# Patient Record
Sex: Male | Born: 1955 | Race: Black or African American | Hispanic: No | Smoking: Current some day smoker
Health system: Southern US, Community
[De-identification: ages and names within clinical notes are randomized; demographics above are authoritative.]

## PROBLEM LIST (undated history)

## (undated) DIAGNOSIS — E119 Type 2 diabetes mellitus without complications: Secondary | ICD-10-CM

## (undated) DIAGNOSIS — I161 Hypertensive emergency: Secondary | ICD-10-CM

## (undated) DIAGNOSIS — A419 Sepsis, unspecified organism: Secondary | ICD-10-CM

## (undated) DIAGNOSIS — N179 Acute kidney failure, unspecified: Secondary | ICD-10-CM

## (undated) DIAGNOSIS — K529 Noninfective gastroenteritis and colitis, unspecified: Secondary | ICD-10-CM

## (undated) DIAGNOSIS — I10 Essential (primary) hypertension: Secondary | ICD-10-CM

## (undated) HISTORY — DX: Sepsis, unspecified organism: A41.9

## (undated) HISTORY — DX: Hypertensive emergency: I16.1

## (undated) HISTORY — DX: Acute kidney failure, unspecified: N17.9

## (undated) HISTORY — PX: OTHER SURGICAL HISTORY: SHX169

---

## 1999-06-30 ENCOUNTER — Emergency Department (HOSPITAL_COMMUNITY): Admission: EM | Admit: 1999-06-30 | Discharge: 1999-06-30 | Payer: Self-pay | Admitting: Emergency Medicine

## 2000-10-19 ENCOUNTER — Emergency Department (HOSPITAL_COMMUNITY): Admission: EM | Admit: 2000-10-19 | Discharge: 2000-10-19 | Payer: Self-pay | Admitting: Emergency Medicine

## 2001-04-11 ENCOUNTER — Emergency Department (HOSPITAL_COMMUNITY): Admission: EM | Admit: 2001-04-11 | Discharge: 2001-04-11 | Payer: Self-pay

## 2007-08-15 ENCOUNTER — Inpatient Hospital Stay (HOSPITAL_COMMUNITY): Admission: EM | Admit: 2007-08-15 | Discharge: 2007-08-19 | Payer: Self-pay | Admitting: Internal Medicine

## 2007-08-15 IMAGING — CR DG CHEST 1V PORT
1 series · 1 of 1 positions shown · non-contrast
Comparison: none

CLINICAL DATA: Hypertension

Portable chest at [ZI]:
No previous available for comparison. Mild cardiomegaly. Tortuous thoracic
aorta. Lungs are clear. No effusion.

[view not recorded]
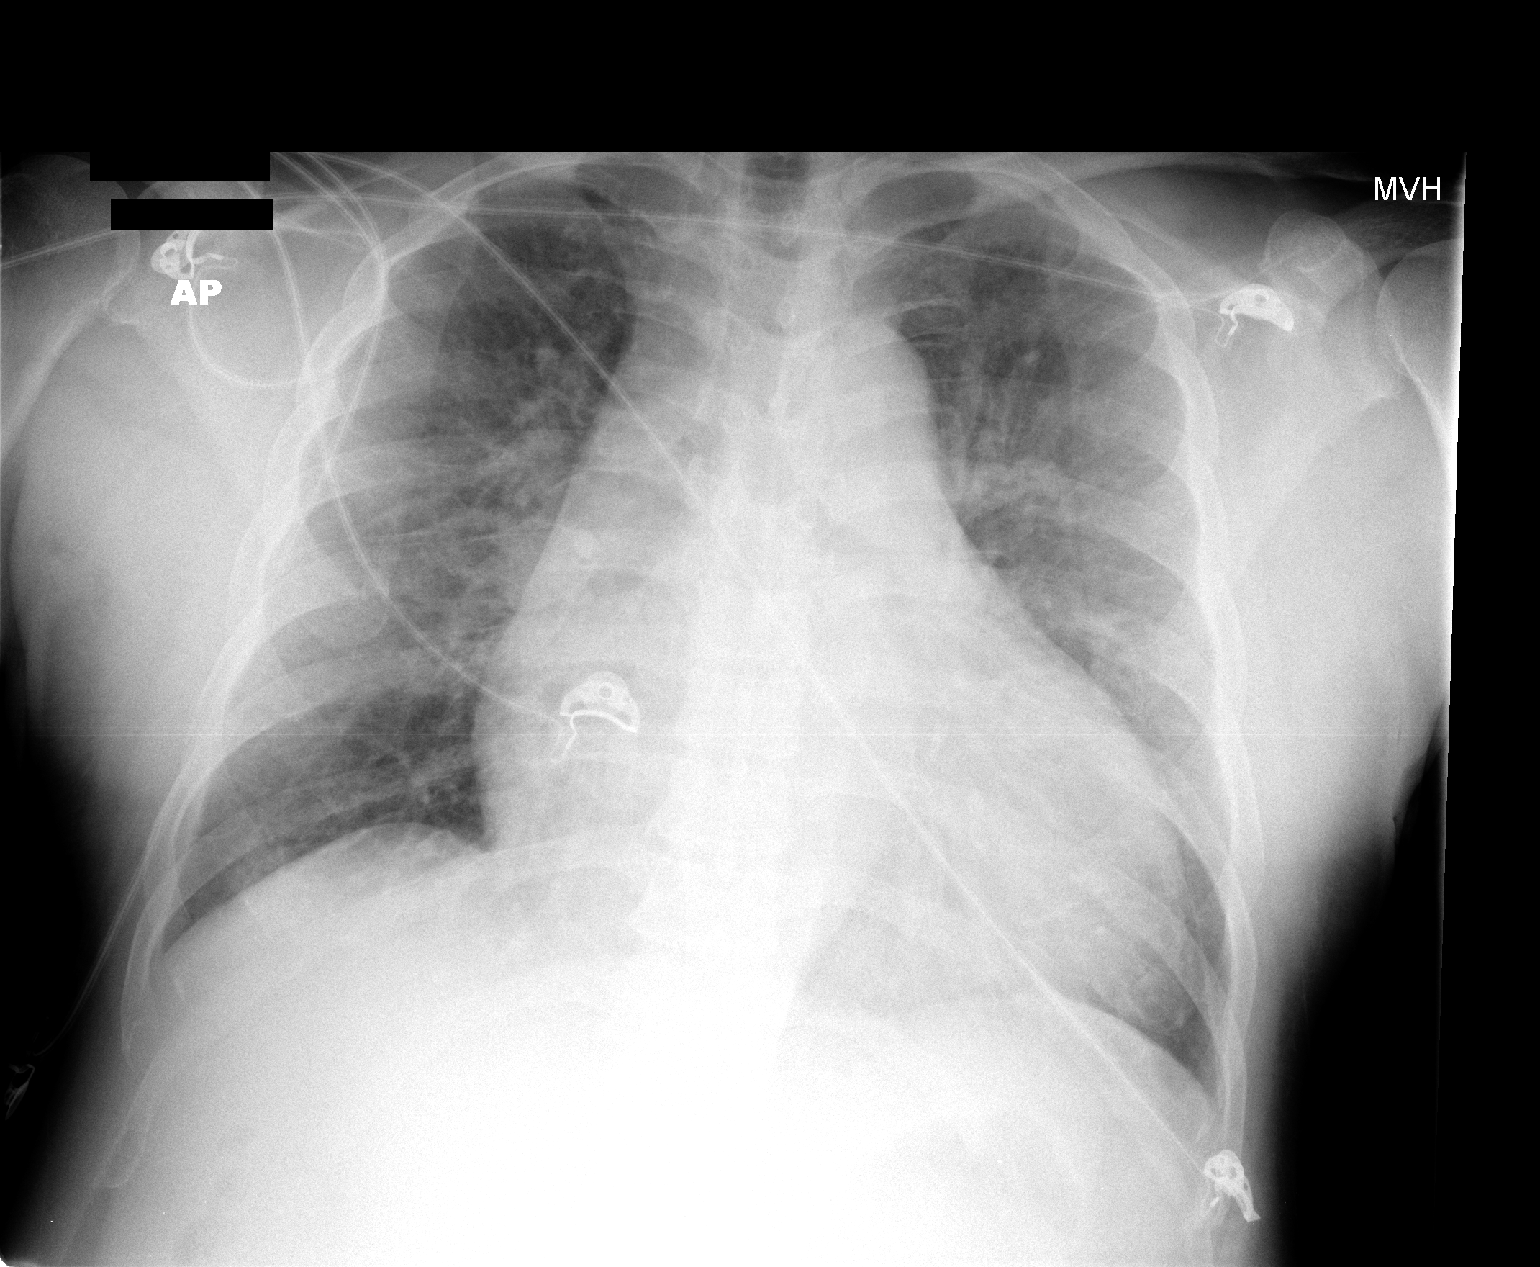

[1 of 1 positions shown; findings below may reference images not displayed]

IMPRESSION: 1. Cardiomegaly

## 2007-10-24 ENCOUNTER — Inpatient Hospital Stay (HOSPITAL_COMMUNITY): Admission: EM | Admit: 2007-10-24 | Discharge: 2007-10-26 | Payer: Self-pay | Admitting: Emergency Medicine

## 2007-10-24 IMAGING — CT CT ABDOMEN W/ CM
2 of 5 series · 17 of 46 positions shown, 19 images · IV contrast (APPLIED)
Comparison: none

CLINICAL DATA: Mid abdominal pain with cramping. 
 ABDOMEN CT WITH CONTRAST:
TECHNIQUE: Multidetector CT imaging of the abdomen was performed following the standard protocol during bolus administration of intravenous contrast.  Helical imaging of the abdomen and supplemental sagittal reformatted images were made.
 Contrast:  100 cc Omnipaque 300 IV and dilute oral contrast.
TECHNIQUE: Multidetector CT imaging of the pelvis was performed following the standard protocol during bolus administration of intravenous contrast.  Helical imaging was performed.

[Series 2: abd/pelv with 5.0 b31f st · axial · 0.68mm/px · z∈[-524,-114]mm · 14 of 94 slices shown, 16 images]
[im 6/94  soft-tissue]
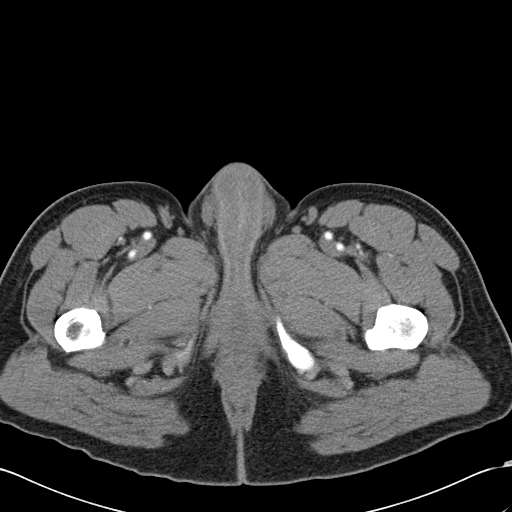
[im 6/94  bone]
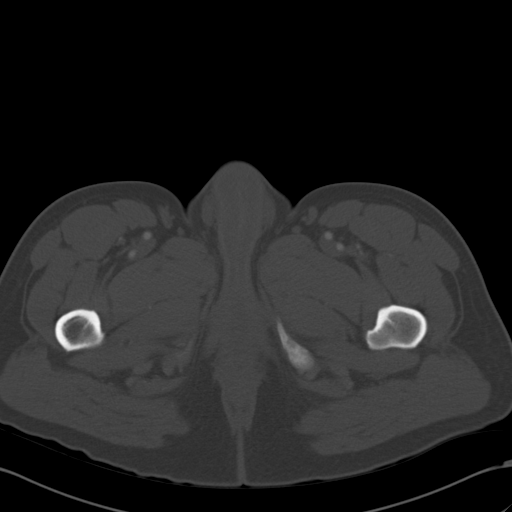
[im 11/94  soft-tissue]
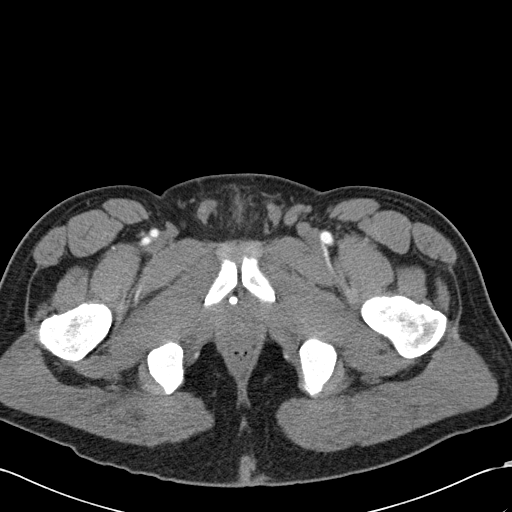
[im 21/94  soft-tissue]
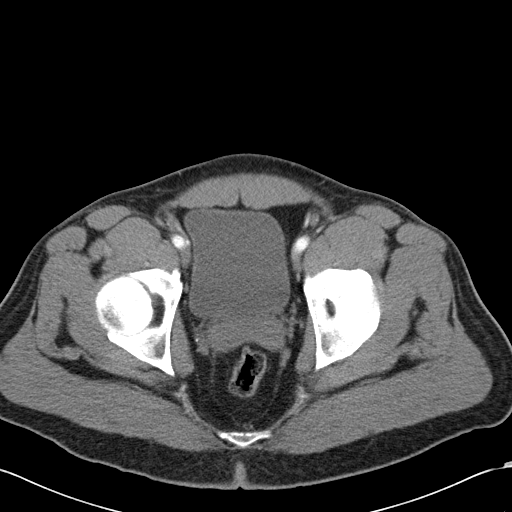
[im 26/94  soft-tissue]
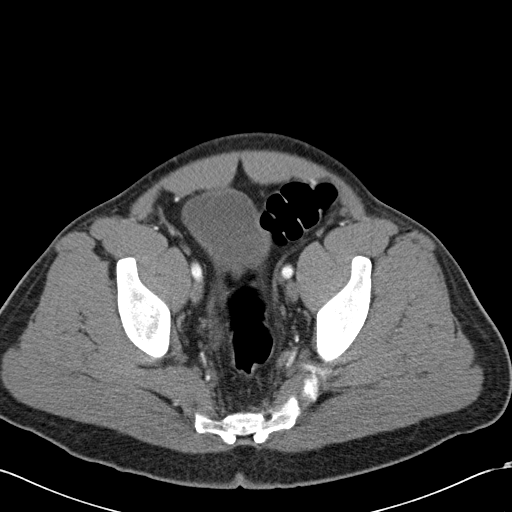
[im 32/94  soft-tissue]
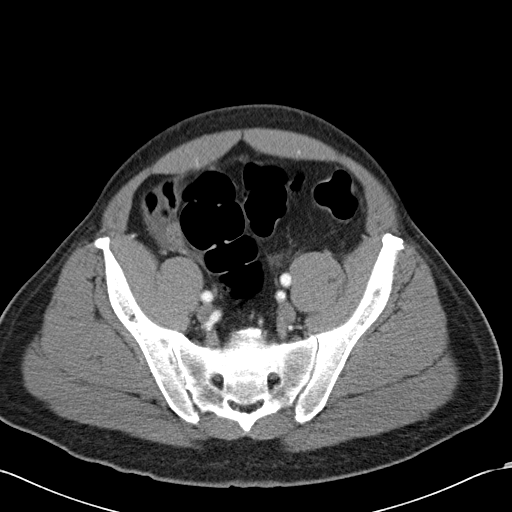
[im 37/94  soft-tissue]
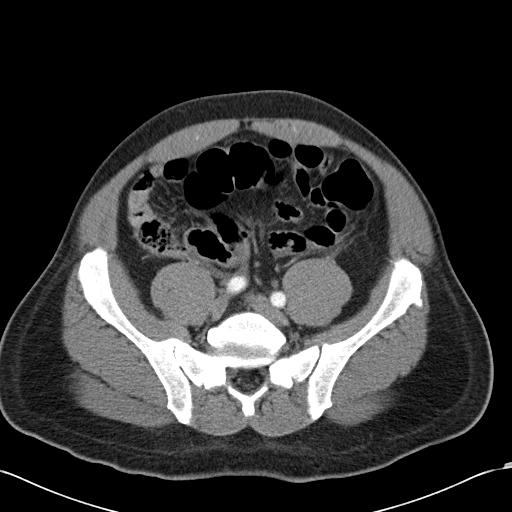
[im 42/94  soft-tissue]
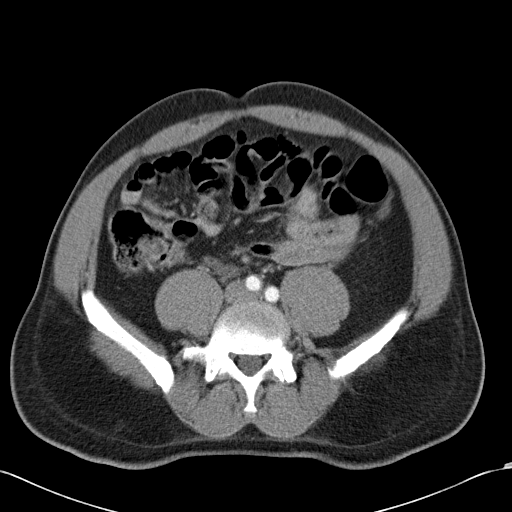
[im 52/94  soft-tissue]
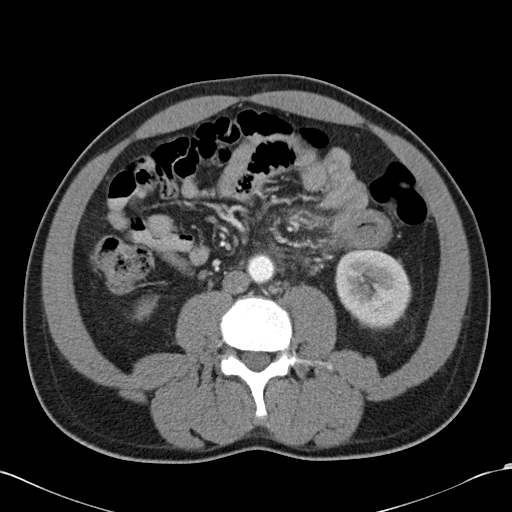
[im 57/94  soft-tissue]
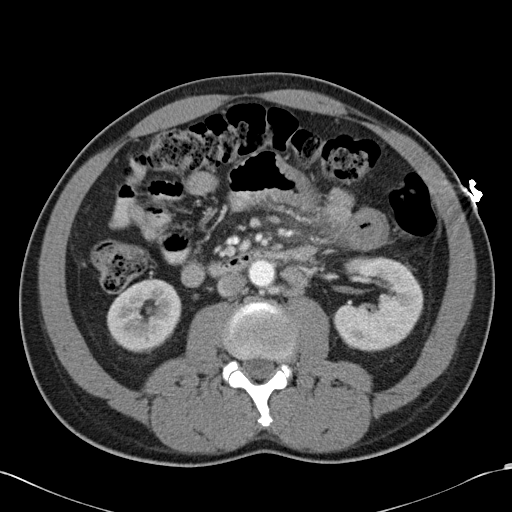
[im 57/94  bone]
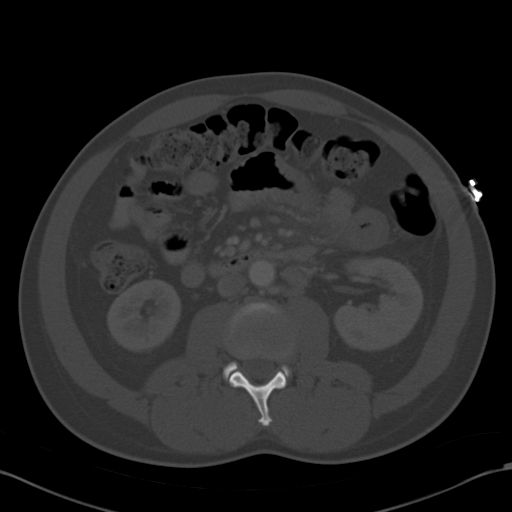
[im 63/94  soft-tissue]
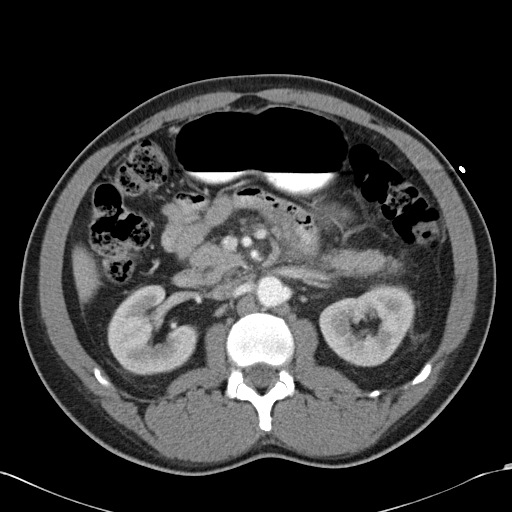
[im 68/94  soft-tissue]
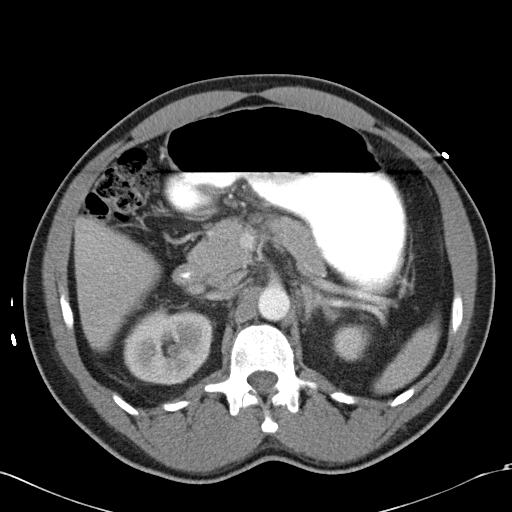
[im 73/94  soft-tissue]
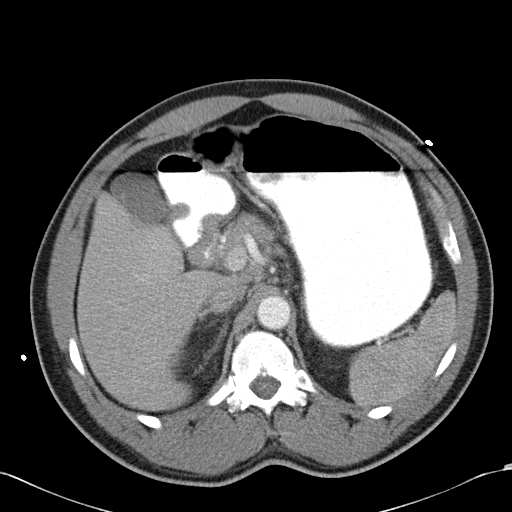
[im 83/94  soft-tissue]
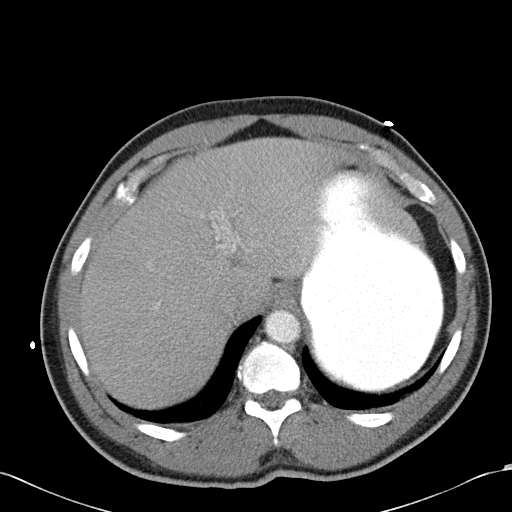
[im 88/94  soft-tissue]
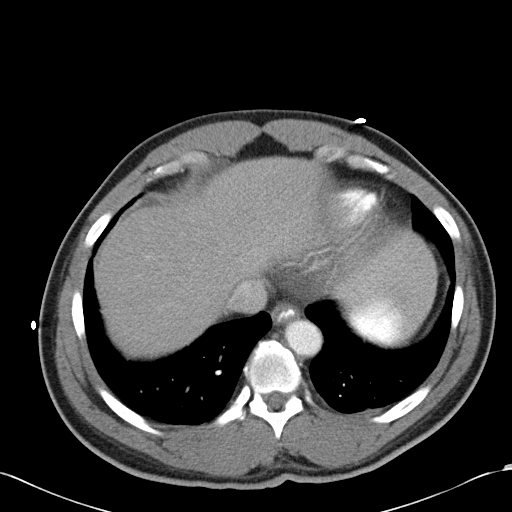

[Series 4: abd/pelv with 2.0 spo cor st · coronal · 0.91mm/px · 3 of 122 slices shown]
[im 41/122  soft-tissue]
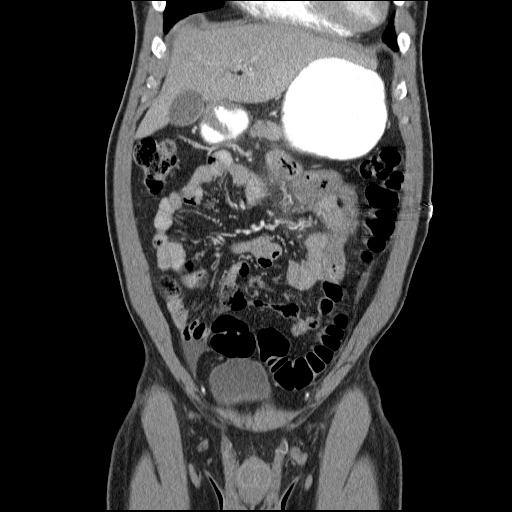
[im 54/122  soft-tissue]
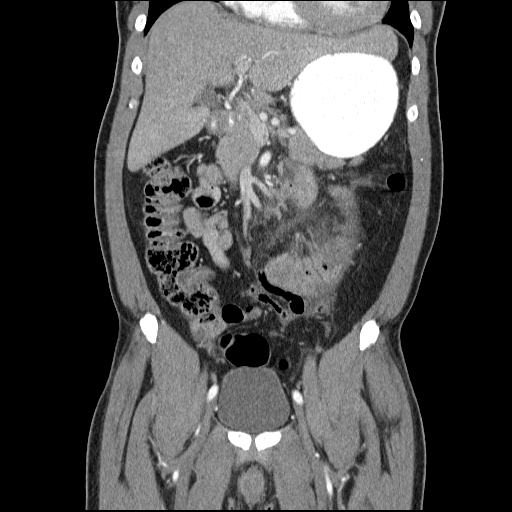
[im 68/122  soft-tissue]
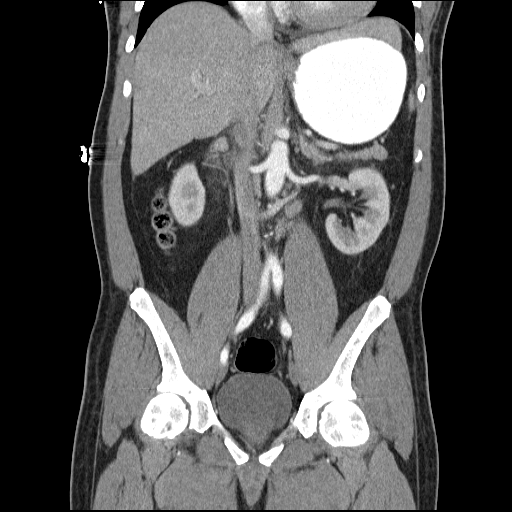

[17 of 46 positions shown; findings below may reference images not displayed]

FINDINGS: Thick-walled loops of small bowel in the region of the jejunum.  These are nonspecific.  The adjacent mesentery is normal.  There is no evidence for obstruction.  The colon is unremarkable.  The appendix is identified without inflammatory change.  Lung bases are clear.  Liver, spleen, pancreas, adrenal glands, and kidneys are normal.  Gallbladder is unremarkable.  Small amount of free fluid within the right lower quadrant.
IMPRESSION: Abnormal thick-walled loops of small bowel, nonspecific.  Question enteritis.  The appendix is normal.  Small amount of free fluid within the right lower quadrant.
 PELVIS CT WITH CONTRAST:
FINDINGS: The bladder is normal.  The ureter is decompressed.  Calcification is attributed to phleboliths.  Sigmoid colon and rectum are unremarkable.
IMPRESSION: CT pelvis negative.

## 2007-10-24 IMAGING — CR DG ABDOMEN ACUTE W/ 1V CHEST
3 series · 3 of 3 positions shown · non-contrast
Comparison: [DATE]

CLINICAL DATA: 51-year-old male with abdominal pain and constipation. History of coronary artery disease and  CHF.
ACUTE ABDOMINAL SERIES - 3 VIEW:

[w chest pa]
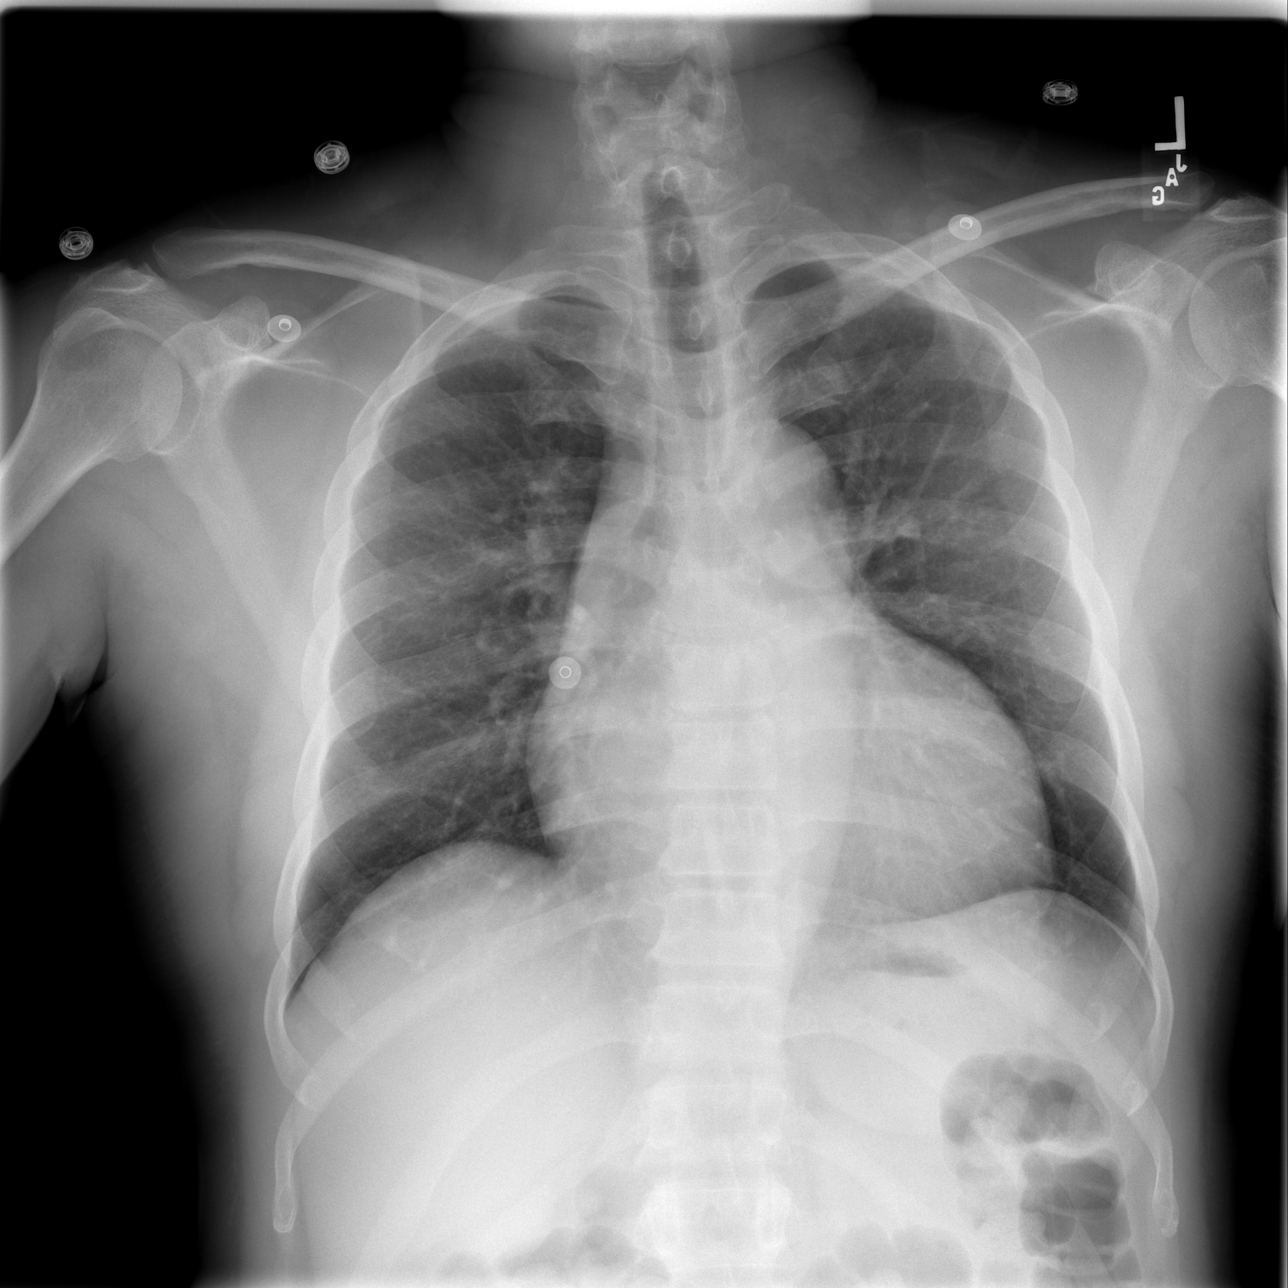

[w abdomen upright]
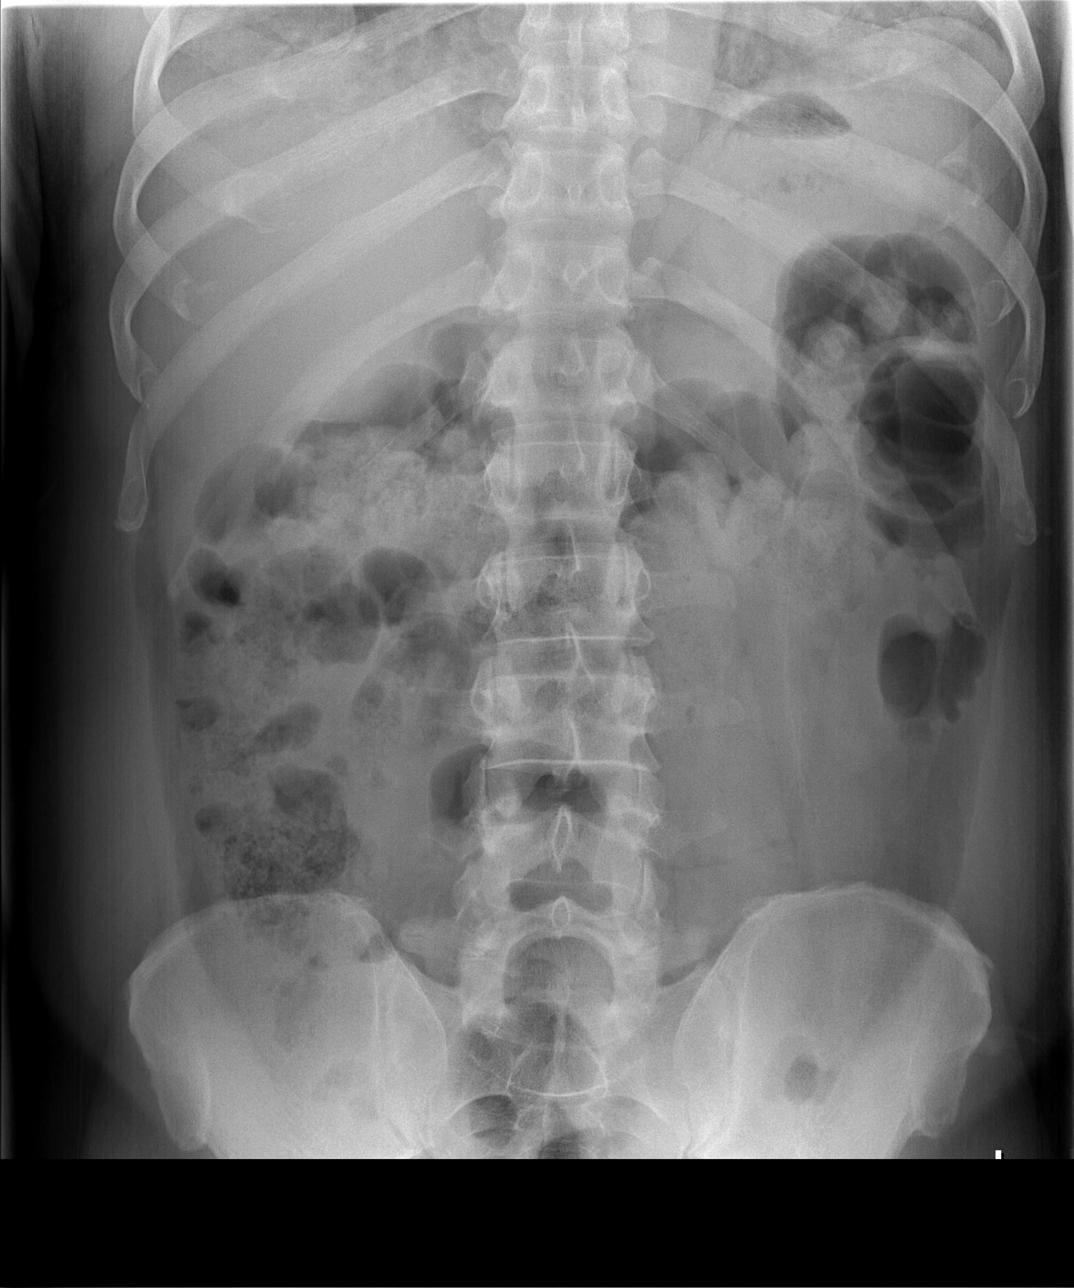

[t abdomen supine]
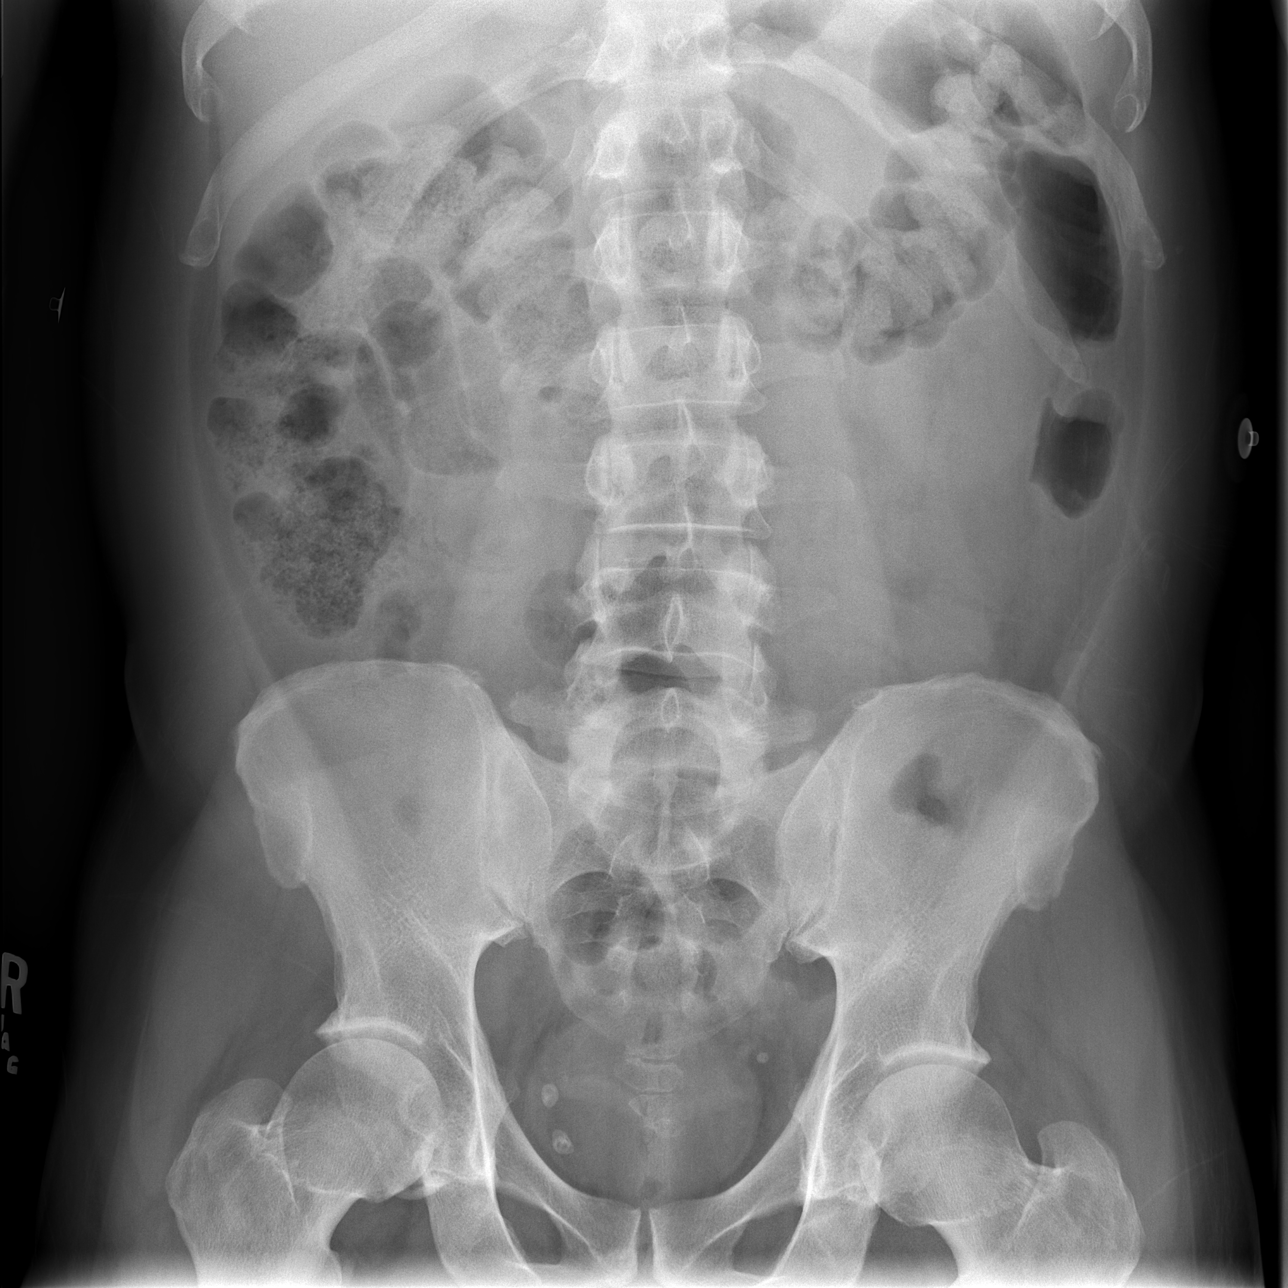

[3 of 3 positions shown; findings below may reference images not displayed]

FINDINGS: The heart is enlarged. No current pneumonia, CHF, or pneumothorax. Moderate retained stool throughout the colon.  No definite obstruction or free air. No abnormal calcification is identified. Pelvic calcifications are likely vascular.
IMPRESSION: 1.  Cardiomegaly without CHF.
2.  Nonobstructive bowel gas pattern.  No definite free air.

## 2011-03-14 NOTE — Discharge Summary (Signed)
Manuel Kramer, Manuel Kramer              ACCOUNT NO.:  [id number]   MEDICAL RECORD NO.:  [id number]          PATIENT TYPE:  INP   LOCATION:  3738                         FACILITY:  [facility]   PHYSICIAN:  Fleet Contras, M.D.    DATE OF BIRTH:  [birth year]   DATE OF ADMISSION:  10/24/2007  DATE OF DISCHARGE:  10/26/2007                               DISCHARGE SUMMARY   DISCHARGE DIAGNOSES:  1. Abdominal pain due to enteritis.  2. Accelerated hypertension felt to be due to noncompliance to      medication, resolved.  3. Congestive heart failure with ejection fraction of 40 percent.  4. Type 2 diabetes, diet-controlled.  5. Leukocytosis on outpatient followup, felt to be secondary to his      enteritis.   DISCHARGE MEDICATIONS:  Patient is going home to continue his  preadmission medications of:  1. BiDil one tab t.i.d.  2. Carvedilol 40 mg daily.  3. Lasix 40 mg daily.  4. K-Dur 20 mEq daily.  5. Norvasc 10 mg daily.  6. Januvia 50 mg daily.   New medicines are:  1. Cipro 500 b.i.d.  2. Flagyl 500 t.i.d.  3. Clonidine patch has been discontinued.  He is to stay on clonidine      0.1 mg p.o. t.i.d.   CONDITION ON DISCHARGE:  Improved, satisfactory.  Patient discharged in  stable satisfactory condition.   He should follow up with his PCP, Dr. Concepcion Elk, in 1-2 weeks, he is  supposed to call for appointment.   REASON FOR ADMISSION:  Accelerated hypertension with enteritis.  Patient  presented to the ED with abdominal pain lower abdominal area with upper  GI symptoms.  He had missed his medications including clonidine patch.  His systolic blood pressure was more than 200 range.  He required  several doses of labetalol.  CT scan showed small bowel wall thickening  consistent with enteritis.  He was admitted to the hospital for IV  antibiotics and followup of his leukocytosis of more than 20,000.  He  has received pain support.  Aggressive antibiotic management as well as  antiplatelet  management was instituted.  Gradually, the patient's  overall status improved including resolution of his upper GI symptoms  and abdominal pain.  He did not have any fever.  We repeated his  leukocyte count the day of admission and was still elevated at more than  20,000.  However, we could not get it repeated today.  The patient is  afebrile.  He denies any chills.  His abdominal pain is resolved nearly  and he does not have any nausea.   VITAL SIGNS THIS MORNING:  A BP of 143/84, pulse 84, temp 98.5 degrees  Fahrenheit, respirations 20.  LUNGS:  Clear to auscultation.  CARDIAC:  No gallop.  ABDOMEN:  Soft, bowel sounds present.  Mild lower abdominal tenderness  without any rebound tenderness.  EXTREMITIES:  No cyanosis, no edema.   He is discharged in stable satisfactory condition.  He has been given a  prescription for antibiotics and clonidine as mentioned above.      Jackie Plum, M.D.  Electronically Signed     ______________________________  Fleet Contras, M.D.    GO/MEDQ  D:  10/26/2007  T:  10/27/2007  Job:  [id number]   cc:   Fleet Contras, M.D.

## 2011-03-14 NOTE — H&P (Signed)
Manuel Kramer, SEELMAN              ACCOUNT NO.:  [id number]   MEDICAL RECORD NO.:  [id number]          PATIENT TYPE:  INP   LOCATION:  3738                         FACILITY:  [facility]   PHYSICIAN:  Jackie Plum, M.D.DATE OF BIRTH:  Aug 22, 1956   DATE OF ADMISSION:  10/24/2007  DATE OF DISCHARGE:                              HISTORY & PHYSICAL   CHIEF COMPLAINT:  Abdominal pain.   HISTORY OF PRESENT ILLNESS:  A 55 year old gentleman who presented with  lower abdominal pain described as constant, dull and cramping and mild  to moderate in intensity without any radiation, of 3 days' duration.  He  had missed his medication for 3 days.  He has had some nausea without  vomiting.  Denied any fever or chills.  In the emergency room the  patient had a CT scan of the abdomen and pelvis after x-ray of the chest  had cardiomegaly without CHF and nonobstructive bowel gas pattern.  The  CT report indicated abnormal, thick-walled loops of small bowel,  nonspecific.  The appendix was normal with a small amount of free fluid  within the right lower quadrant.  The patient blood pressure was very  high with systolic more than 220.  Therefore, he was admitted for  further evaluation and management of abdominal pain, enteritis and  accelerated hypertension.   PAST MEDICAL HISTORY:  Positive history of type 2 diabetes and  hypertension.   MEDICATION HISTORY:  As noted and signed by me on the medication  reconciliation form, which is a part of the patient's permanent medical  record.   PHYSICAL EXAMINATION:  VITAL SIGNS:  BP was 280/51.  Respirations 20.  The patient was afebrile.  GENERAL:  The patient was moderately in pain.  No acute distress.  LUNGS:  Fields are clear, __________ no crackles.  CARDIAC:  No gallop.  ABDOMEN:  Full, soft, with lower abdominal tenderness noted without any  rebound tenderness.  The patient was alert and oriented x3.   Labs reviewed with leukocytosis of 21,000 on  CBC with a thrombocytopenia  of 117.  UA negative for UTI.  Chemistries included a sodium of 134 with  normal BUN and creatinine.  The rest of the complete metabolic panel was  noted.   IMPRESSION:  1. Accelerated hypertension due to medication noncompliance,      especially patient on clonidine.  2. Abdominal pain secondary to enteritis.  3. Medication noncompliance.  4. Leukocytosis.   The patient is admitted for pain control, antibiotics, supportive care.      Jackie Plum, M.D.  Electronically Signed     GO/MEDQ  D:  10/25/2007  T:  10/25/2007  Job:  [id number]   cc:   Fleet Contras, M.D.

## 2011-03-14 NOTE — H&P (Signed)
Kramer, Manuel              ACCOUNT NO.:  [id number]   MEDICAL RECORD NO.:  [id number]          PATIENT TYPE:  INP   LOCATION:  1225                         FACILITY:  [facility]   PHYSICIAN:  Fleet Contras, M.D.    DATE OF BIRTH:  17-Nov-1955   DATE OF ADMISSION:  08/15/2007  DATE OF DISCHARGE:                              HISTORY & PHYSICAL   PRESENTING COMPLAINT:  Leg swelling, shortness of breath.   HISTORY OF PRESENT ILLNESS:  Manuel Kramer is a 55 year old, African-  American gentleman with past medical history significant for type 2  diabetes mellitus and hypertension, who was last seen by me in January  2007.  He walked into the office today with a 2-week history of  progressively worsening symptoms of shortness of breath, dyspnea on  exertion, orthopnea, PND and leg swelling.  He denied having any chest  pain or palpitations.  He stated that he can only walk a few blocks  before he gets out of breath.  He can hardly climb steps, and his legs  have progressively become more swollen.  He has gained a lot of weight  recently, most likely due to the fluids he is retaining.  He denies any  nausea, diaphoresis or vomiting.  He has some headaches and dizziness  but no syncope or seizures.  He denies any abdominal pain, diarrhea or  constipation.  He has no dysuria, frequency or hematuria.  He has no  joint pains or swelling.  At home, he has had to sleep on a chair  because he is unable to lie flat, and he sleeps with several pillows if  he lies on the bed.  His wife says he has not been sleeping for several  days due to his shortness of breath, and he tends to jump out of bed in  the middle of the night and seems to gasp for air.  In the office today,  he is not in any acute respiratory or painful distress at rest, but he  seems to get out of breath with speaking.  His initial vital signs show  a blood pressure of 180/100.  He weighed in at 185 pounds at a height of  4 feet 5 inches.   His heart rate was 90, regular, respiratory rate was  18, temperature was 98.6.  He has some rales in his lungs.  Extremities  show 3+ pitting edema of the feet, ankles, legs up to his thighs.  It  was thought because of his new onset symptoms he was best to be admitted  to the hospital for further evaluation and appropriate management.   PAST MEDICAL HISTORY:  1. Type 2 diabetes mellitus.  2. Systemic hypertension.   MEDICATION HISTORY:  He had been on NovoLog insulin and Amaryl and Actos  in the past, but he has not been back to the office for refills or  adjustment in his medications in almost 2 years.   ALLERGIES:  He has no known drug allergies.   PAST SURGICAL HISTORY:  Noncontributory.   FAMILY AND SOCIAL HISTORY:  His father died of prostate  cancer.  His  mother died of complications of heart disease and diabetes.  He had a  sister who was healthy.  He has two children, one son and one daughter,  who are healthy.  He is married and lives with his wife.  He smokes  about four cigarettes per day and has a 30-pack-year history.  He does  not have a regular exercise routine.  Denies any use of alcohol, tobacco  or illicit drugs.   REVIEW OF SYSTEMS:  Essentially as above.   PHYSICAL EXAMINATION:  He is a middle-aged, African-American gentleman  sitting on the examination bed not in acute respiratory or painful  distress.  He is not pale.  He is not icteric.  He is not cyanosed.  His pupils are  equal and reactive to light and accommodation.  His neck is supple with no elevated JVD.  No cervical lymphadenopathy.  No carotid bruit.  CHEST:  Shows good air entry bilaterally with bibasilar rales, fine  rales.  Heart sounds 1 and 2 are heard.  No murmurs, no S3 gallops.  ABDOMEN:  Is soft, nontender, no masses.  Bowel sounds are present.  EXTREMITIES:  Shows 3+ pitting edema of the feet, ankle, legs, knees and  thighs.  Peripheral pulses are present bilaterally.  CENTRAL  NERVOUS SYSTEM:  Is alert and oriented x3 with no focal  neurological deficits.   Primary lab performed in the office:  CBG was 97.   ASSESSMENT:  Manuel Kramer is a 55 year old, African-American gentleman with  multiple medical problems including type 2 diabetes mellitus,  hypertension and noncompliance with prescribed therapy.  He presents  today with signs and symptoms of new onset congestive heart failure.  He  recalls that he had been admitted in the past with congestive heart  failure and was told he had an enlarged.  He is being admitted today to  the hospital for further evaluation and appropriate management.   ADMISSION DIAGNOSES:  1. Congestive heart failure, likely with systolic dysfunction.  2. Hypertension with urgency.  He will be placed in a step-down bed.      He will be on 2-gram sodium, carbohydrate-modified, medium diet.      Activity will be at bedrest with bathroom privileges.  Vital signs      will be checked every 4 hours.  CBG is to be checked a.c. and h.s.      Strict I&Os and daily weights.  Further respiratory data would      include a CBC, CMET, BNP, urinalysis, EKG, CKs and troponin q.8h.      x3.  Chest x-ray and 2-D echocardiogram will be requested.   MEDICATION:  He will be started on:  1. IV Lasix 40 mg q.8h.  2. IV nitroglycerin at 5 mcg and titrate to keep systolic blood      pressure greater than 110.  3. P.o. Lisinopril 10 mg daily.  4. Protonix 40 mg daily.  5. Aspirin 325 mg daily.  6. Lovenox 40 mg subcutaneous daily for DVT prophylaxis.  7. He can have Tylenol 650 q.6h. p.r.n. for headaches.   This plan of care has been discussed with him and his wife, and their  questions have been answered.      Fleet Contras, M.D.  Electronically Signed     EA/MEDQ  D:  08/15/2007  T:  08/17/2007  Job:  [id number]

## 2011-03-17 NOTE — Discharge Summary (Signed)
Manuel Kramer, Manuel Kramer              ACCOUNT NO.:  [id number]   MEDICAL RECORD NO.:  [id number]          PATIENT TYPE:  INP   LOCATION:  1425                         FACILITY:  [facility]   PHYSICIAN:  Fleet Contras, M.D.    DATE OF BIRTH:  [birth year]   DATE OF ADMISSION:  08/15/2007  DATE OF DISCHARGE:  08/19/2007                               DISCHARGE SUMMARY   HISTORY OF PRESENT ILLNESS:  Mr. Norberto is a 55 year old African American  gentleman with past medical history for systemic hypertension, diabetes  mellitus.  She was admitted directly from the office where she presented  with a 2-week history of progressively worsening shortness of breath,  dyspnea on exertion, orthopnea, PND and leg swelling.  He gained quite a  bit weight due to fluid retention.  Denied any nausea or vomiting or  diaphoresis and chest pain, palpitations.  He did have some headaches  and dizziness, but no syncope or seizures.  In the office his blood  pressure was 180/100.  Heart rate was 90, respiratory rate 18,  temperature 98.6.  He had rales in the lungs, 3+pitting edema in the  feet and ankles and legs up to the knees.  Was thought to have onset of  congestive heart failure and was admitted to the hospital.   HOSPITAL COURSE:  On admission was started on intravenous Lasix q.8 h  intravenous, nitroglycerin 5 mcg titrated to systolic blood pressure  less than 150, Norvasc p.o. 10 mg daily, Lovenox 40 mg subcu for DVT  prophylaxis, Protonix 40 mg daily for GI prophylaxis, aspirin 325 mg  daily.  Further laboratory data included CBC, C-met, BNP, urinalysis,  EKG, serial CKs and troponin, chest x-ray and 2-D echocardiogram were  requested.  He was admitted to the step-down unit.  His initial  laboratory data showed a sodium 140, potassium 3.5, chloride 104,  bicarbonate of 28, BUN 19, creatinine 1.74, glucose of 178, white count  was 8.7, hemoglobin 11.8, hematocrit 35.5 and platelets 237, CPK and  troponin x3  were negative.  BNP was 828.  Chest x-ray showed  cardiomegaly without edema.  EKG showed normal sinus rhythm with LVH,  biatrial enlargement.  Coreg, amlodipine, BiDil were added to heart  failure regimen and intravenous labetalol was tapered and discontinued.  He was on basal insulin bolus as well as sliding scale NovoLog insulin.  CBGs were checked a.c. and h.s.  A 2-D echocardiogram revealed ejection  fraction of 40%.  The ACE inhibitors and angiotensin II blockers were  put on hold due to mild renal insufficiency.  Potassium was repleted,  both orally as well as intravenously.  His condition improved  significantly during hospitalization.  Was able to sleep peacefully at  lying flat without any shortness of breath.  On August 19, 2007, his  blood pressure was 139/92, heart rate of 80, respirations 18,  temperature 98.1 and O2 sats on room air was 98-95%.  Chest was clear to  auscultation.  Extremities showed no edema.  His laboratory data showed  sodium of 138, potassium 3.0, chloride 102, bicarbonate of 28, BUN 22,  creatinine 1.6 and glucose of 83.  BNP was down to 260 was therefore  considered stable for discharge home.   DISCHARGE DIAGNOSIS:  1. Decompensated congestive heart failure with systolic dysfunction      and ejection fraction of 40%.  2. Hypertensive urgency with improved control.  3. Type 2 diabetes mellitus, well-controlled.  4. Renal insufficiency, mild.  5. Hypokalemia, repleted.   DISCHARGE MEDICATIONS:  1. BiDil one p.o. t.i.d.  2. Coreg CR 40 mg daily.  3. Lasix 40 mg daily.  4. Potassium 20 mEq daily.  5. Norvasc 10 mg daily.  6. Catapres patch 0.1 mg TTS one q. Weekly.  7. Januvia 100 mg half p.o. daily.   FOLLOWUP:  To follow-up with me in the office in one week and to  maintain a low-sodium, heart healthy diet.   CONDITION ON DISCHARGE:  Stable.   DISPOSITION:  Home.      Fleet Contras, M.D.  Electronically Signed     EA/MEDQ  D:   09/11/2007  T:  09/12/2007  Job:  [id number]

## 2011-08-04 LAB — COMPREHENSIVE METABOLIC PANEL
ALT: 48
Alkaline Phosphatase: 60
BUN: 19
CO2: 30
Calcium: 8.5
GFR calc non Af Amer: 59 — ABNORMAL LOW
Glucose, Bld: 144 — ABNORMAL HIGH
Total Protein: 5.6 — ABNORMAL LOW

## 2011-08-04 LAB — CBC
HCT: 33.8 — ABNORMAL LOW
HCT: 44.5
Hemoglobin: 11.3 — ABNORMAL LOW
Hemoglobin: 15
MCHC: 33.7
MCV: 93.4
Platelets: 101 — ABNORMAL LOW
RBC: 3.62 — ABNORMAL LOW
RBC: 4.78
RDW: 14.2
WBC: 21.6 — ABNORMAL HIGH

## 2011-08-04 LAB — URINALYSIS, ROUTINE W REFLEX MICROSCOPIC
Leukocytes, UA: NEGATIVE
Nitrite: NEGATIVE
Protein, ur: 100 — AB
Specific Gravity, Urine: 1.023
Urobilinogen, UA: 1

## 2011-08-04 LAB — BASIC METABOLIC PANEL
BUN: 20
Chloride: 99
GFR calc Af Amer: 58 — ABNORMAL LOW
GFR calc non Af Amer: 48 — ABNORMAL LOW
Potassium: 3.7
Sodium: 135

## 2011-08-04 LAB — DIFFERENTIAL
Basophils Relative: 0
Eosinophils Absolute: 0
Lymphs Abs: 1.4
Monocytes Relative: 9
Neutro Abs: 17.7 — ABNORMAL HIGH
Neutrophils Relative %: 84 — ABNORMAL HIGH

## 2011-08-04 LAB — URINE MICROSCOPIC-ADD ON

## 2011-08-04 LAB — CARDIAC PANEL(CRET KIN+CKTOT+MB+TROPI)
CK, MB: 4
Total CK: 21

## 2011-08-09 LAB — CARDIAC PANEL(CRET KIN+CKTOT+MB+TROPI)
CK, MB: 2.7
CK, MB: 3.5
CK, MB: 4
Relative Index: INVALID
Total CK: 31
Total CK: 48

## 2011-08-09 LAB — BASIC METABOLIC PANEL
Calcium: 8.5
Calcium: 8.6
Calcium: 8.8
Chloride: 102
Chloride: 104
Chloride: 105
Creatinine, Ser: 1.6 — ABNORMAL HIGH
Creatinine, Ser: 1.73 — ABNORMAL HIGH
GFR calc Af Amer: 47 — ABNORMAL LOW
GFR calc Af Amer: 51 — ABNORMAL LOW
GFR calc Af Amer: 56 — ABNORMAL LOW
GFR calc non Af Amer: 39 — ABNORMAL LOW
GFR calc non Af Amer: 42 — ABNORMAL LOW
GFR calc non Af Amer: 46 — ABNORMAL LOW
Glucose, Bld: 122 — ABNORMAL HIGH
Potassium: 3.5
Sodium: 140
Sodium: 141

## 2011-08-09 LAB — COMPREHENSIVE METABOLIC PANEL
ALT: 55 — ABNORMAL HIGH
Albumin: 3.7
Alkaline Phosphatase: 74
Chloride: 104
Glucose, Bld: 168 — ABNORMAL HIGH
Potassium: 3.3 — ABNORMAL LOW
Sodium: 139
Total Bilirubin: 0.9
Total Protein: 6.7

## 2011-08-09 LAB — URINALYSIS, ROUTINE W REFLEX MICROSCOPIC
Bilirubin Urine: NEGATIVE
Glucose, UA: NEGATIVE
Hgb urine dipstick: NEGATIVE
Nitrite: NEGATIVE
Specific Gravity, Urine: 1.012
pH: 6

## 2011-08-09 LAB — LIPID PANEL
Triglycerides: 87
VLDL: 17

## 2011-08-09 LAB — CBC
HCT: 35.5 — ABNORMAL LOW
HCT: 37.9 — ABNORMAL LOW
Hemoglobin: 11.8 — ABNORMAL LOW
Hemoglobin: 12.8 — ABNORMAL LOW
RDW: 14.8 — ABNORMAL HIGH
WBC: 8.7
WBC: 9.4

## 2011-08-09 LAB — TSH: TSH: 1.995

## 2011-08-09 LAB — B-NATRIURETIC PEPTIDE (CONVERTED LAB)
Pro B Natriuretic peptide (BNP): 307 — ABNORMAL HIGH
Pro B Natriuretic peptide (BNP): 728 — ABNORMAL HIGH
Pro B Natriuretic peptide (BNP): 877 — ABNORMAL HIGH

## 2014-10-02 ENCOUNTER — Encounter: Payer: Self-pay | Admitting: Internal Medicine

## 2014-10-02 ENCOUNTER — Ambulatory Visit: Payer: Medicaid Other | Attending: Internal Medicine | Admitting: Internal Medicine

## 2014-10-02 VITALS — BP 134/94 | HR 72 | Temp 98.0°F | Resp 16 | Wt 159.4 lb

## 2014-10-02 DIAGNOSIS — F17210 Nicotine dependence, cigarettes, uncomplicated: Secondary | ICD-10-CM | POA: Diagnosis not present

## 2014-10-02 DIAGNOSIS — Z8042 Family history of malignant neoplasm of prostate: Secondary | ICD-10-CM | POA: Insufficient documentation

## 2014-10-02 DIAGNOSIS — G4700 Insomnia, unspecified: Secondary | ICD-10-CM

## 2014-10-02 DIAGNOSIS — I10 Essential (primary) hypertension: Secondary | ICD-10-CM | POA: Insufficient documentation

## 2014-10-02 DIAGNOSIS — N529 Male erectile dysfunction, unspecified: Secondary | ICD-10-CM | POA: Diagnosis not present

## 2014-10-02 DIAGNOSIS — I161 Hypertensive emergency: Secondary | ICD-10-CM

## 2014-10-02 DIAGNOSIS — N289 Disorder of kidney and ureter, unspecified: Secondary | ICD-10-CM

## 2014-10-02 DIAGNOSIS — F17200 Nicotine dependence, unspecified, uncomplicated: Secondary | ICD-10-CM | POA: Insufficient documentation

## 2014-10-02 DIAGNOSIS — Z720 Tobacco use: Secondary | ICD-10-CM

## 2014-10-02 DIAGNOSIS — E139 Other specified diabetes mellitus without complications: Secondary | ICD-10-CM | POA: Insufficient documentation

## 2014-10-02 DIAGNOSIS — Z794 Long term (current) use of insulin: Secondary | ICD-10-CM | POA: Diagnosis not present

## 2014-10-02 DIAGNOSIS — E1165 Type 2 diabetes mellitus with hyperglycemia: Secondary | ICD-10-CM | POA: Diagnosis present

## 2014-10-02 DIAGNOSIS — Z7982 Long term (current) use of aspirin: Secondary | ICD-10-CM | POA: Diagnosis not present

## 2014-10-02 HISTORY — DX: Hypertensive emergency: I16.1

## 2014-10-02 HISTORY — DX: Disorder of kidney and ureter, unspecified: N28.9

## 2014-10-02 LAB — GLUCOSE, POCT (MANUAL RESULT ENTRY): POC GLUCOSE: 345 mg/dL — AB (ref 70–99)

## 2014-10-02 LAB — POCT GLYCOSYLATED HEMOGLOBIN (HGB A1C): HEMOGLOBIN A1C: 11.5

## 2014-10-02 MED ORDER — AMLODIPINE BESYLATE 10 MG PO TABS: 10.0000 mg | ORAL_TABLET | Freq: Every day | ORAL | Status: DC

## 2014-10-02 MED ORDER — LISINOPRIL 40 MG PO TABS: 40.0000 mg | ORAL_TABLET | Freq: Every day | ORAL | Status: DC

## 2014-10-02 MED ORDER — INSULIN NPH ISOPHANE & REGULAR (70-30) 100 UNIT/ML ~~LOC~~ SUSP: 40.0000 [IU] | Freq: Two times a day (BID) | SUBCUTANEOUS | Status: DC

## 2014-10-02 MED ORDER — INSULIN GLARGINE 100 UNIT/ML ~~LOC~~ SOLN: 45.0000 [IU] | Freq: Every day | SUBCUTANEOUS | Status: DC

## 2014-10-02 MED ORDER — ATENOLOL-CHLORTHALIDONE 100-25 MG PO TABS: 1.0000 | ORAL_TABLET | Freq: Every day | ORAL | Status: DC

## 2014-10-02 NOTE — Patient Instructions (Addendum)
Diabetes Mellitus and Food It is important for you to manage your blood sugar (glucose) level. Your blood glucose level can be greatly affected by what you eat. Eating healthier foods in the appropriate amounts throughout the day at about the same time each day will help you control your blood glucose level. It can also help slow or prevent worsening of your diabetes mellitus. Healthy eating may even help you improve the level of your blood pressure and reach or maintain a healthy weight.  HOW CAN FOOD AFFECT ME? Carbohydrates Carbohydrates affect your blood glucose level more than any other type of food. Your dietitian will help you determine how many carbohydrates to eat at each meal and teach you how to count carbohydrates. Counting carbohydrates is important to keep your blood glucose at a healthy level, especially if you are using insulin or taking certain medicines for diabetes mellitus. Alcohol Alcohol can cause sudden decreases in blood glucose (hypoglycemia), especially if you use insulin or take certain medicines for diabetes mellitus. Hypoglycemia can be a life-threatening condition. Symptoms of hypoglycemia (sleepiness, dizziness, and disorientation) are similar to symptoms of having too much alcohol.  If your health care provider has given you approval to drink alcohol, do so in moderation and use the following guidelines:  Women should not have more than one drink per day, and men should not have more than two drinks per day. One drink is equal to:  12 oz of beer.  5 oz of wine.  1 oz of hard liquor.  Do not drink on an empty stomach.  Keep yourself hydrated. Have water, diet soda, or unsweetened iced tea.  Regular soda, juice, and other mixers might contain a lot of carbohydrates and should be counted. WHAT FOODS ARE NOT RECOMMENDED? As you make food choices, it is important to remember that all foods are not the same. Some foods have fewer nutrients per serving than other  foods, even though they might have the same number of calories or carbohydrates. It is difficult to get your body what it needs when you eat foods with fewer nutrients. Examples of foods that you should avoid that are high in calories and carbohydrates but low in nutrients include:  Trans fats (most processed foods list trans fats on the Nutrition Facts label).  Regular soda.  Juice.  Candy.  Sweets, such as cake, pie, doughnuts, and cookies.  Fried foods. WHAT FOODS CAN I EAT? Have nutrient-rich foods, which will nourish your body and keep you healthy. The food you should eat also will depend on several factors, including:  The calories you need.  The medicines you take.  Your weight.  Your blood glucose level.  Your blood pressure level.  Your cholesterol level. You also should eat a variety of foods, including:  Protein, such as meat, poultry, fish, tofu, nuts, and seeds (lean animal proteins are best).  Fruits.  Vegetables.  Dairy products, such as milk, cheese, and yogurt (low fat is best).  Breads, grains, pasta, cereal, rice, and beans.  Fats such as olive oil, trans fat-free margarine, canola oil, avocado, and olives. DOES EVERYONE WITH DIABETES MELLITUS HAVE THE SAME MEAL PLAN? Because every person with diabetes mellitus is different, there is not one meal plan that works for everyone. It is very important that you meet with a dietitian who will help you create a meal plan that is just right for you. Document Released: 07/13/2005 Document Revised: 10/21/2013 Document Reviewed: 09/12/2013 ExitCare Patient Information 2015 [employer]. This   information is not intended to replace advice given to you by your health care provider. Make sure you discuss any questions you have with your health care provider. DASH Eating Plan DASH stands for "Dietary Approaches to Stop Hypertension." The DASH eating plan is a healthy eating plan that has been shown to reduce high  blood pressure (hypertension). Additional health benefits may include reducing the risk of type 2 diabetes mellitus, heart disease, and stroke. The DASH eating plan may also help with weight loss. WHAT DO I NEED TO KNOW ABOUT THE DASH EATING PLAN? For the DASH eating plan, you will follow these general guidelines:  Choose foods with a percent daily value for sodium of less than 5% (as listed on the food label).  Use salt-free seasonings or herbs instead of table salt or sea salt.  Check with your health care provider or pharmacist before using salt substitutes.  Eat lower-sodium products, often labeled as "lower sodium" or "no salt added."  Eat fresh foods.  Eat more vegetables, fruits, and low-fat dairy products.  Choose whole grains. Look for the word "whole" as the first word in the ingredient list.  Choose fish and skinless chicken or turkey more often than red meat. Limit fish, poultry, and meat to 6 oz (170 g) each day.  Limit sweets, desserts, sugars, and sugary drinks.  Choose heart-healthy fats.  Limit cheese to 1 oz (28 g) per day.  Eat more home-cooked food and less restaurant, buffet, and fast food.  Limit fried foods.  Cook foods using methods other than frying.  Limit canned vegetables. If you do use them, rinse them well to decrease the sodium.  When eating at a restaurant, ask that your food be prepared with less salt, or no salt if possible. WHAT FOODS CAN I EAT? Seek help from a dietitian for individual calorie needs. Grains Whole grain or whole wheat bread. [place]atton rice. Whole grain or whole wheat pasta. Quinoa, bulgur, and whole grain cereals. Low-sodium cereals. Corn or whole wheat flour tortillas. Whole grain cornbread. Whole grain crackers. Low-sodium crackers. Vegetables Fresh or frozen vegetables (raw, steamed, roasted, or grilled). Low-sodium or reduced-sodium tomato and vegetable juices. Low-sodium or reduced-sodium tomato sauce and paste. Low-sodium  or reduced-sodium canned vegetables.  Fruits All fresh, canned (in natural juice), or frozen fruits. Meat and Other Protein Products Ground beef (85% or leaner), grass-fed beef, or beef trimmed of fat. Skinless chicken or turkey. Ground chicken or turkey. Pork trimmed of fat. All fish and seafood. Eggs. Dried beans, peas, or lentils. Unsalted nuts and seeds. Unsalted canned beans. Dairy Low-fat dairy products, such as skim or 1% milk, 2% or reduced-fat cheeses, low-fat ricotta or cottage cheese, or plain low-fat yogurt. Low-sodium or reduced-sodium cheeses. Fats and Oils Tub margarines without trans fats. Light or reduced-fat mayonnaise and salad dressings (reduced sodium). Avocado. Safflower, olive, or canola oils. Natural peanut or almond butter. Other Unsalted popcorn and pretzels. The items listed above may not be a complete list of recommended foods or beverages. Contact your dietitian for more options. WHAT FOODS ARE NOT RECOMMENDED? Grains White bread. White pasta. White rice. Refined cornbread. Bagels and croissants. Crackers that contain trans fat. Vegetables Creamed or fried vegetables. Vegetables in a cheese sauce. Regular canned vegetables. Regular canned tomato sauce and paste. Regular tomato and vegetable juices. Fruits Dried fruits. Canned fruit in light or heavy syrup. Fruit juice. Meat and Other Protein Products Fatty cuts of meat. Ribs, chicken wings, bacon, sausage, bologna, salami, chitterlings, fatback, hot   dogs, bratwurst, and packaged luncheon meats. Salted nuts and seeds. Canned beans with salt. Dairy Whole or 2% milk, cream, half-and-half, and cream cheese. Whole-fat or sweetened yogurt. Full-fat cheeses or blue cheese. Nondairy creamers and whipped toppings. Processed cheese, cheese spreads, or cheese curds. Condiments Onion and garlic salt, seasoned salt, table salt, and sea salt. Canned and packaged gravies. Worcestershire sauce. Tartar sauce. Barbecue sauce.  Teriyaki sauce. Soy sauce, including reduced sodium. Steak sauce. Fish sauce. Oyster sauce. Cocktail sauce. Horseradish. Ketchup and mustard. Meat flavorings and tenderizers. Bouillon cubes. Hot sauce. Tabasco sauce. Marinades. Taco seasonings. Relishes. Fats and Oils Butter, stick margarine, lard, shortening, ghee, and bacon fat. Coconut, palm kernel, or palm oils. Regular salad dressings. Other Pickles and olives. Salted popcorn and pretzels. The items listed above may not be a complete list of foods and beverages to avoid. Contact your dietitian for more information. WHERE CAN I FIND MORE INFORMATION? National Heart, Lung, and [employer] Institute: www.nhlbi.nih.gov/health/health-topics/topics/dash/ Document Released: 10/05/2011 Document Revised: 03/02/2014 Document Reviewed: 08/20/2013 ExitCare Patient Information 2015 [employer]. This information is not intended to replace advice given to you by your health care provider. Make sure you discuss any questions you have with your health care provider. Smoking Cessation Quitting smoking is important to your health and has many advantages. However, it is not always easy to quit since nicotine is a very addictive drug. Oftentimes, people try 3 times or more before being able to quit. This document explains the best ways for you to prepare to quit smoking. Quitting takes hard work and a lot of effort, but you can do it. ADVANTAGES OF QUITTING SMOKING  You will live longer, feel better, and live better.  Your body will feel the impact of quitting smoking almost immediately.  Within 20 minutes, blood pressure decreases. Your pulse returns to its normal level.  After 8 hours, carbon monoxide levels in the blood return to normal. Your oxygen level increases.  After 24 hours, the chance of having a heart attack starts to decrease. Your breath, hair, and body stop smelling like smoke.  After 48 hours, damaged nerve endings begin to recover. Your sense  of taste and smell improve.  After 72 hours, the body is virtually free of nicotine. Your bronchial tubes relax and breathing becomes easier.  After 2 to 12 weeks, lungs can hold more air. Exercise becomes easier and circulation improves.  The risk of having a heart attack, stroke, cancer, or lung disease is greatly reduced.  After 1 year, the risk of coronary heart disease is cut in half.  After 5 years, the risk of stroke falls to the same as a nonsmoker.  After 10 years, the risk of lung cancer is cut in half and the risk of other cancers decreases significantly.  After 15 years, the risk of coronary heart disease drops, usually to the level of a nonsmoker.  If you are pregnant, quitting smoking will improve your chances of having a healthy baby.  The people you live with, especially any children, will be healthier.  You will have extra money to spend on things other than cigarettes. QUESTIONS TO THINK ABOUT BEFORE ATTEMPTING TO QUIT You may want to talk about your answers with your health care provider.  Why do you want to quit?  If you tried to quit in the past, what helped and what did not?  What will be the most difficult situations for you after you quit? How will you plan to handle them?  Who   can help you through the tough times? Your family? Friends? A health care provider?  What pleasures do you get from smoking? What ways can you still get pleasure if you quit? Here are some questions to ask your health care provider:  How can you help me to be successful at quitting?  What medicine do you think would be best for me and how should I take it?  What should I do if I need more help?  What is smoking withdrawal like? How can I get information on withdrawal? GET READY  Set a quit date.  Change your environment by getting rid of all cigarettes, ashtrays, matches, and lighters in your home, car, or work. Do not let people smoke in your home.  Review your past  attempts to quit. Think about what worked and what did not. GET SUPPORT AND ENCOURAGEMENT You have a better chance of being successful if you have help. You can get support in many ways.  Tell your family, friends, and coworkers that you are going to quit and need their support. Ask them not to smoke around you.  Get individual, group, or telephone counseling and support. Programs are available at local hospitals and health centers. Call your local health department for information about programs in your area.  Spiritual beliefs and practices may help some smokers quit.  Download a "quit meter" on your computer to keep track of quit statistics, such as how long you have gone without smoking, cigarettes not smoked, and money saved.  Get a self-help book about quitting smoking and staying off tobacco. LEARN NEW SKILLS AND BEHAVIORS  Distract yourself from urges to smoke. Talk to someone, go for a walk, or occupy your time with a task.  Change your normal routine. Take a different route to work. Drink tea instead of coffee. Eat breakfast in a different place.  Reduce your stress. Take a hot bath, exercise, or read a book.  Plan something enjoyable to do every day. Reward yourself for not smoking.  Explore interactive web-based programs that specialize in helping you quit. GET MEDICINE AND USE IT CORRECTLY Medicines can help you stop smoking and decrease the urge to smoke. Combining medicine with the above behavioral methods and support can greatly increase your chances of successfully quitting smoking.  Nicotine replacement therapy helps deliver nicotine to your body without the negative effects and risks of smoking. Nicotine replacement therapy includes nicotine gum, lozenges, inhalers, nasal sprays, and skin patches. Some may be available over-the-counter and others require a prescription.  Antidepressant medicine helps people abstain from smoking, but how this works is unknown. This  medicine is available by prescription.  Nicotinic receptor partial agonist medicine simulates the effect of nicotine in your brain. This medicine is available by prescription. Ask your health care provider for advice about which medicines to use and how to use them based on your health history. Your health care provider will tell you what side effects to look out for if you choose to be on a medicine or therapy. Carefully read the information on the package. Do not use any other product containing nicotine while using a nicotine replacement product.  RELAPSE OR DIFFICULT SITUATIONS Most relapses occur within the first 3 months after quitting. Do not be discouraged if you start smoking again. Remember, most people try several times before finally quitting. You may have symptoms of withdrawal because your body is used to nicotine. You may crave cigarettes, be irritable, feel very hungry, cough often, get   headaches, or have difficulty concentrating. The withdrawal symptoms are only temporary. They are strongest when you first quit, but they will go away within 10-14 days. To reduce the chances of relapse, try to:  Avoid drinking alcohol. Drinking lowers your chances of successfully quitting.  Reduce the amount of caffeine you consume. Once you quit smoking, the amount of caffeine in your body increases and can give you symptoms, such as a rapid heartbeat, sweating, and anxiety.  Avoid smokers because they can make you want to smoke.  Do not let weight gain distract you. Many smokers will gain weight when they quit, usually less than 10 pounds. Eat a healthy diet and stay active. You can always lose the weight gained after you quit.  Find ways to improve your mood other than smoking. FOR MORE INFORMATION  www.smokefree.gov  Document Released: 10/10/2001 Document Revised: 03/02/2014 Document Reviewed: 01/25/2012 ExitCare Patient Information 2015 [employer]. This information is not intended to  replace advice given to you by your health care provider. Make sure you discuss any questions you have with your health care provider.  

## 2014-10-02 NOTE — Progress Notes (Signed)
Patient Demographics  Manuel Kramer, is a 58 y.o. male  VQQ:Manuel [id number]:[id number]  EPP:[id number]  DOB - 02-25-1956  CC:  Chief Complaint  Patient presents with  . Establish Care       HPI: Manuel PaceBertrand Kramer is a 58 y.o. male here today to establish medical care.he has history of diabetes, hypertension,erectile dysfunction, insomnia, renal insufficiency, patient denies any headache dizziness chest and shortness of breath, for diabetes patient has been taking Lantus40 units as well as Novolin 40 units twice a day with meals, patient does not check his blood sugar regularly, his hemoglobin A1c today is 11.5%, patient has already eaten today and his sugar level was elevated.patient also history of insomnia and was using Ambien in the past, have advised patient to try over-the-counter melatonin. Patient has No headache, No chest pain, No abdominal pain - No Nausea, No new weakness tingling or numbness, No Cough - SOB.  Not on File History reviewed. No pertinent past medical history. No current outpatient prescriptions on file prior to visit.   No current facility-administered medications on file prior to visit.   Family History  Problem Relation Age of Onset  . Cancer Mother   . Diabetes Mother   . Hypertension Mother   . Cancer Father    History   Social History  . Marital Status: Married    Spouse Name: N/A    Number of Children: N/A  . Years of Education: N/A   Occupational History  . Not on file.   Social History Main Topics  . Smoking status: Current Every Day Smoker -- 1.00 packs/day for 40 years  . Smokeless tobacco: Not on file  . Alcohol Use: 0.0 oz/week    0 Not specified per week  . Drug Use: Not on file  . Sexual Activity: Not on file   Other Topics Concern  . Not on file   Social History Narrative  . No narrative on file    Review of Systems: Constitutional: Negative for fever, chills, diaphoresis, activity change, appetite change and fatigue. HENT:  Negative for ear pain, nosebleeds, congestion, facial swelling, rhinorrhea, neck pain, neck stiffness and ear discharge.  Eyes: Negative for pain, discharge, redness, itching and visual disturbance. Respiratory: Negative for cough, choking, chest tightness, shortness of breath, wheezing and stridor.  Cardiovascular: Negative for chest pain, palpitations and leg swelling. Gastrointestinal: Negative for abdominal distention. Genitourinary: Negative for dysuria, urgency, frequency, hematuria, flank pain, decreased urine volume, difficulty urinating and dyspareunia.  Musculoskeletal: Negative for back pain, joint swelling, arthralgia and gait problem. Neurological: Negative for dizziness, tremors, seizures, syncope, facial asymmetry, speech difficulty, weakness, light-headedness, numbness and headaches.  Hematological: Negative for adenopathy. Does not bruise/bleed easily. Psychiatric/Behavioral: Negative for hallucinations, behavioral problems, confusion, dysphoric mood, decreased concentration and agitation.    Objective:   Filed Vitals:   10/02/14 0933  BP: 134/94  Pulse: 72  Temp: 98 F (36.7 C)  Resp: 16    Physical Exam: Constitutional: Patient appears well-developed and well-nourished. No distress. HENT: Normocephalic, atraumatic, External right and left ear normal. Oropharynx is clear and moist.  Eyes: Conjunctivae and EOM are normal. PERRLA, no scleral icterus. Neck: Normal ROM. Neck supple. No JVD. No tracheal deviation. No thyromegaly. CVS: RRR, S1/S2 +, no murmurs, no gallops, no carotid bruit.  Pulmonary: Effort and breath sounds normal, no stridor, rhonchi, wheezes, rales.  Abdominal: Soft. BS +, no distension, tenderness, rebound or guarding.  Musculoskeletal: Normal range of motion. No edema and no tenderness.  Neuro:  Alert. Normal reflexes, muscle tone coordination. No cranial nerve deficit. Skin: Skin is warm and dry. No rash noted. Not diaphoretic. No erythema. No  pallor. Psychiatric: Normal mood and affect. Behavior, judgment, thought content normal.  Lab Results  Component Value Date   WBC 21.6* 10/25/2007   HGB 11.3 DELTA CHECK NOTED* 10/25/2007   HCT 33.8* 10/25/2007   MCV 93.4 10/25/2007   PLT 101* 10/25/2007   Lab Results  Component Value Date   CREATININE 1.55* 10/25/2007   BUN 20 10/25/2007   NA 135 10/25/2007   K 3.7 10/25/2007   CL 99 10/25/2007   CO2 32 10/25/2007    Lab Results  Component Value Date   HGBA1C 11.5 10/02/2014   Lipid Panel     Component Value Date/Time   CHOL  08/17/2007 0412    118        ATP III CLASSIFICATION:  <200     mg/dL   Desirable  132-440200-239  mg/dL   Borderline High  >=102>=240    mg/dL   High   TRIG 87 72/53/664410/18/2008 0412   HDL 31* 08/17/2007 0412   CHOLHDL 3.8 08/17/2007 0412   VLDL 17 08/17/2007 0412   LDLCALC  08/17/2007 0412    70        Total Cholesterol/HDL:CHD Risk Coronary Heart Disease Risk Table                     Men   Women  1/2 Average Risk   3.4   3.3       Assessment and plan:   1. Other specified diabetes mellitus without complications Results for orders placed or performed in visit on 10/02/14  HgB A1c  Result Value Ref Range   Hemoglobin A1C 11.5   Glucose (CBG)  Result Value Ref Range   POC Glucose 345 (A) 70 - 99 mg/dl   Diabetes is uncontrolled, have advised patient for diabetes meal planning, monitor fingerstick glucose at home, increased the dose of Lantus to 25 units continue with Humalog, also referred to endocrinology.  - Lipid panel; Future - Vit D  25 hydroxy (rtn osteoporosis monitoring); Future - Ambulatory referral to Endocrinology - insulin NPH-regular Human (NOVOLIN 70/30) (70-30) 100 UNIT/ML injection; Inject 40 Units into the skin 2 (two) times daily with a meal.  Dispense: 10 mL; Refill: 3 - insulin glargine (LANTUS) 100 UNIT/ML injection; Inject 0.45 mLs (45 Units total) into the skin at bedtime.  Dispense: 10 mL; Refill: 3 - Ambulatory referral to  Ophthalmology  2. Essential hypertension Blood pressure is borderline elevated, I have advised patient for DASH diet, continue with current meds.  - CBC with Differential; Future - COMPLETE METABOLIC PANEL WITH GFR; Future - TSH; Future - lisinopril (PRINIVIL,ZESTRIL) 40 MG tablet; Take 1 tablet (40 mg total) by mouth daily.  Dispense: 30 tablet; Refill: 3 - atenolol-chlorthalidone (TENORETIC) 100-25 MG per tablet; Take 1 tablet by mouth daily.  Dispense: 30 tablet; Refill: 3 - amLODipine (NORVASC) 10 MG tablet; Take 1 tablet (10 mg total) by mouth daily.  Dispense: 30 tablet; Refill: 3  3. Smoking Advised patient to quit smoking.  4. Family history of prostate cancer  - PSA; Future  5. Renal insufficiency Will check blood chemistry.     Health Maintenance -Colonoscopy: uptodate history of polyp due in 3 years   -Vaccinations:  Patient declines flu shot and pneumovax  Return in about 3 months (around 01/01/2015) for diabetes, hypertension.  Doris CheadleADVANI, Manuel Nie, MD

## 2014-10-02 NOTE — Progress Notes (Signed)
Patient here to establish care Has history of DM and HTN Recently moved here from [place] Currently taking insulin for his diabetes

## 2014-10-09 ENCOUNTER — Other Ambulatory Visit: Payer: Medicaid Other

## 2014-10-16 ENCOUNTER — Telehealth: Payer: Self-pay | Admitting: Internal Medicine

## 2014-10-16 NOTE — Telephone Encounter (Signed)
Patient has called in today to see if he can receive a medication refill for nicotine patches to he can cease smoking; please f/u with patient about this request

## 2014-10-19 ENCOUNTER — Ambulatory Visit: Payer: Medicaid Other | Attending: Internal Medicine

## 2014-10-19 ENCOUNTER — Telehealth: Payer: Self-pay | Admitting: Internal Medicine

## 2014-10-19 DIAGNOSIS — I10 Essential (primary) hypertension: Secondary | ICD-10-CM

## 2014-10-19 LAB — CBC WITH DIFFERENTIAL/PLATELET
BASOS ABS: 0 10*3/uL (ref 0.0–0.1)
Basophils Relative: 0 % (ref 0–1)
EOS ABS: 0.1 10*3/uL (ref 0.0–0.7)
EOS PCT: 2 % (ref 0–5)
HCT: 49.2 % (ref 39.0–52.0)
HEMOGLOBIN: 16.8 g/dL (ref 13.0–17.0)
LYMPHS ABS: 2.3 10*3/uL (ref 0.7–4.0)
LYMPHS PCT: 31 % (ref 12–46)
MCH: 32.2 pg (ref 26.0–34.0)
MCHC: 34.1 g/dL (ref 30.0–36.0)
MCV: 94.3 fL (ref 78.0–100.0)
MPV: 11.1 fL (ref 9.4–12.4)
Monocytes Absolute: 0.7 10*3/uL (ref 0.1–1.0)
Monocytes Relative: 9 % (ref 3–12)
NEUTROS PCT: 58 % (ref 43–77)
Neutro Abs: 4.3 10*3/uL (ref 1.7–7.7)
PLATELETS: 291 10*3/uL (ref 150–400)
RBC: 5.22 MIL/uL (ref 4.22–5.81)
RDW: 12.7 % (ref 11.5–15.5)
WBC: 7.4 10*3/uL (ref 4.0–10.5)

## 2014-10-19 MED ORDER — VARENICLINE TARTRATE 0.5 MG X 11 & 1 MG X 42 PO MISC: ORAL | Status: DC

## 2014-10-19 NOTE — Telephone Encounter (Signed)
Patient has come in today for a lab appointment; patient is requesting to have a script from PCP to help him cease smoking. Please f/u with patient about this request

## 2014-10-19 NOTE — Telephone Encounter (Signed)
Prescription for chantix done

## 2014-10-19 NOTE — Telephone Encounter (Signed)
Patient has come in today for a lab appointment; patient is requesting to have a script from PCP to help him cease smoking. Please f/u with patient about this request  Please f/u

## 2014-12-24 ENCOUNTER — Encounter (HOSPITAL_COMMUNITY): Payer: Self-pay

## 2014-12-24 ENCOUNTER — Emergency Department (HOSPITAL_COMMUNITY)
Admission: EM | Admit: 2014-12-24 | Discharge: 2014-12-24 | Disposition: A | Discharge status: Home / Self Care | Payer: Medicaid Other | Attending: Emergency Medicine | Admitting: Emergency Medicine

## 2014-12-24 DIAGNOSIS — I10 Essential (primary) hypertension: Secondary | ICD-10-CM | POA: Insufficient documentation

## 2014-12-24 DIAGNOSIS — Z7982 Long term (current) use of aspirin: Secondary | ICD-10-CM | POA: Insufficient documentation

## 2014-12-24 DIAGNOSIS — N4889 Other specified disorders of penis: Secondary | ICD-10-CM | POA: Diagnosis present

## 2014-12-24 DIAGNOSIS — Z720 Tobacco use: Secondary | ICD-10-CM | POA: Diagnosis not present

## 2014-12-24 DIAGNOSIS — E119 Type 2 diabetes mellitus without complications: Secondary | ICD-10-CM | POA: Insufficient documentation

## 2014-12-24 DIAGNOSIS — Z79899 Other long term (current) drug therapy: Secondary | ICD-10-CM | POA: Insufficient documentation

## 2014-12-24 DIAGNOSIS — Z794 Long term (current) use of insulin: Secondary | ICD-10-CM | POA: Insufficient documentation

## 2014-12-24 HISTORY — DX: Type 2 diabetes mellitus without complications: E11.9

## 2014-12-24 HISTORY — DX: Essential (primary) hypertension: I10

## 2014-12-24 LAB — URINALYSIS, ROUTINE W REFLEX MICROSCOPIC
Bilirubin Urine: NEGATIVE
Glucose, UA: >1000 mg/dL — AB
Hgb urine dipstick: NEGATIVE
KETONES UR: NEGATIVE mg/dL
LEUKOCYTES UA: NEGATIVE
NITRITE: NEGATIVE
PH: 7 (ref 5.0–8.0)
PROTEIN: NEGATIVE mg/dL
Specific Gravity, Urine: 1.037 — ABNORMAL HIGH (ref 1.005–1.030)
UROBILINOGEN UA: 1 mg/dL (ref 0.0–1.0)

## 2014-12-24 LAB — URINE MICROSCOPIC-ADD ON

## 2014-12-24 NOTE — ED Provider Notes (Signed)
CSN: [id number]     Arrival date & time 12/24/14  1139 History  This chart was scribed for Sharilyn Sites, PA-C working with Donnetta Hutching, MD by Elveria Rising, ED Scribe. This patient was seen in room TR06C/TR06C and the patient's care was started at 12:48 PM.   Chief Complaint  Patient presents with  . Penis Pain   The history is provided by the patient. No language interpreter was used.   HPI Comments: DACEN FRAYRE is a 59 y.o. male who presents to the Emergency Department complaining of intermittent aching/throbbing penile pain for one week now. Patient likens pain to a pulled muscle or a toothache. Patient denies dysuria, urethral discharge, hematuria or additional urinary symptoms.  No testicle pain or swelling. Patient reports remote history of UTI in college, but states that his pain is different. Patient denies injury. Patient denies personal history of hernia. Patient shares history of Diabetes; he inquires if his penile pain could be resultant of characteristic nerve pain.  No fever, chills, sweats, abdominal pain, flank pain.  VSS on arrival.  Past Medical History  Diagnosis Date  . Diabetes mellitus without complication   . Hypertension    Past Surgical History  Procedure Laterality Date  . Coloscopy       POLYP   Family History  Problem Relation Age of Onset  . Cancer Mother   . Diabetes Mother   . Hypertension Mother   . Cancer Father    History  Substance Use Topics  . Smoking status: Current Every Day Smoker -- 1.00 packs/day for 40 years  . Smokeless tobacco: Not on file  . Alcohol Use: 0.0 oz/week    0 Standard drinks or equivalent per week    Review of Systems  Constitutional: Negative for fever and chills.  Genitourinary: Positive for penile pain. Negative for dysuria, hematuria, discharge and testicular pain.   Allergies  Review of patient's allergies indicates not on file.  Home Medications   Prior to Admission medications   Medication Sig Start  Date End Date Taking? Authorizing Provider  amLODipine (NORVASC) 10 MG tablet Take 1 tablet (10 mg total) by mouth daily. 10/02/14   Doris Cheadle, MD  aspirin EC 81 MG tablet Take 81 mg by mouth daily.    Historical Provider, MD  atenolol-chlorthalidone (TENORETIC) 100-25 MG per tablet Take 1 tablet by mouth daily. 10/02/14   Doris Cheadle, MD  cloNIDine (CATAPRES) 0.1 MG tablet Take 0.1 mg by mouth 2 (two) times daily.    Historical Provider, MD  Dapagliflozin Propanediol (FARXIGA PO) Take by mouth.    Historical Provider, MD  insulin glargine (LANTUS) 100 UNIT/ML injection Inject 0.45 mLs (45 Units total) into the skin at bedtime. 10/02/14   Doris Cheadle, MD  insulin NPH-regular Human (NOVOLIN 70/30) (70-30) 100 UNIT/ML injection Inject 40 Units into the skin 2 (two) times daily with a meal. 10/02/14   Doris Cheadle, MD  lisinopril (PRINIVIL,ZESTRIL) 40 MG tablet Take 1 tablet (40 mg total) by mouth daily. 10/02/14   Doris Cheadle, MD  Sildenafil Citrate (VIAGRA PO) Take by mouth.    Historical Provider, MD  varenicline (CHANTIX STARTING MONTH PAK) 0.5 MG X 11 & 1 MG X 42 tablet Take one 0.5 mg tablet by mouth once daily for 3 days, then increase to one 0.5 mg tablet twice daily for 4 days, then increase to one 1 mg tablet twice daily. 10/19/14   Doris Cheadle, MD  Zolpidem Tartrate (AMBIEN PO) Take by mouth.  Historical Provider, MD   .Triage Vitals: BP 108/76 mmHg  Pulse 83  Temp(Src) 98.6 F (37 C) (Oral)  Resp 22  SpO2 99% Physical Exam  Constitutional: He is oriented to person, place, and time. He appears well-developed and well-nourished. No distress.  HENT:  Head: Normocephalic and atraumatic.  Mouth/Throat: Oropharynx is clear and moist.  Eyes: Conjunctivae and EOM are normal. Pupils are equal, round, and reactive to light.  Neck: Normal range of motion. Neck supple.  Cardiovascular: Normal rate, regular rhythm and normal heart sounds.   Pulmonary/Chest: Effort normal and breath  sounds normal. No respiratory distress. He has no wheezes.  Genitourinary:  Patient refused genital exam  Musculoskeletal: Normal range of motion.  Neurological: He is alert and oriented to person, place, and time.  Skin: Skin is warm and dry. He is not diaphoretic.  Psychiatric: He has a normal mood and affect.  Nursing note and vitals reviewed.   ED Course  Procedures (including critical care time)  COORDINATION OF CARE: 12:50 PM- Patient declines genital exam stating the appearance of his penis is normal. Discussed treatment plan with patient at bedside and patient agreed to plan.   Labs Review Labs Reviewed  URINALYSIS, ROUTINE W REFLEX MICROSCOPIC    Imaging Review No results found.   EKG Interpretation None      MDM   Final diagnoses:  None   59 year old male with complaint of penile pain intermittent for the past week. He states his pain is like a dull toothache. He denies any injuries to his groin. He denies any testicle pain or swelling.  No history of hernia. He denies any dysuria, urinary frequency, or hematuria. No urethral discharge. Patient refused genital exam.  I asked if he would feel more comfortable with a male provider, he states he does not think that he needs a genital exam at this time as "everything looks normal down there". UA was obtained which is noninfectious.  At this time, cannot definitively explain patient's penile pain as he will not consent to genital exam of any type. He will be referred to urology.  Discussed plan with patient, he/she acknowledged understanding and agreed with plan of care.  Return precautions given for new or worsening symptoms.  I personally performed the services described in this documentation, which was scribed in my presence. The recorded information has been reviewed and is accurate.  Garlon HatchetLisa M Sanders, PA-C 12/24/14 1347  Donnetta HutchingBrian Cook, MD 12/24/14 [phone]

## 2014-12-24 NOTE — ED Notes (Signed)
Pt reports penile pain about a week ago. Pt denies discharge, dysuria.  Pt sts "it feels like I pulled something.  I get sharp pains from time to time."  Reports hx of diabetes and UTI but sts this feels different from when he had a UTI.

## 2014-12-24 NOTE — ED Notes (Signed)
Called for pt twice no answer

## 2014-12-24 NOTE — Discharge Instructions (Signed)
Follow-up with urology regarding penis pain. Return to the ED for new concerns.

## 2015-01-01 ENCOUNTER — Emergency Department (HOSPITAL_COMMUNITY): Payer: Medicaid Other

## 2015-01-01 ENCOUNTER — Emergency Department (HOSPITAL_COMMUNITY)
Admission: EM | Admit: 2015-01-01 | Discharge: 2015-01-01 | Disposition: A | Discharge status: Home / Self Care | Payer: Medicaid Other | Attending: Emergency Medicine | Admitting: Emergency Medicine

## 2015-01-01 ENCOUNTER — Encounter (HOSPITAL_COMMUNITY): Payer: Self-pay | Admitting: Emergency Medicine

## 2015-01-01 DIAGNOSIS — Z79899 Other long term (current) drug therapy: Secondary | ICD-10-CM | POA: Diagnosis not present

## 2015-01-01 DIAGNOSIS — Z794 Long term (current) use of insulin: Secondary | ICD-10-CM | POA: Insufficient documentation

## 2015-01-01 DIAGNOSIS — I10 Essential (primary) hypertension: Secondary | ICD-10-CM | POA: Insufficient documentation

## 2015-01-01 DIAGNOSIS — I861 Scrotal varices: Secondary | ICD-10-CM | POA: Diagnosis not present

## 2015-01-01 DIAGNOSIS — Z720 Tobacco use: Secondary | ICD-10-CM | POA: Diagnosis not present

## 2015-01-01 DIAGNOSIS — R52 Pain, unspecified: Secondary | ICD-10-CM

## 2015-01-01 DIAGNOSIS — Z7982 Long term (current) use of aspirin: Secondary | ICD-10-CM | POA: Diagnosis not present

## 2015-01-01 DIAGNOSIS — E119 Type 2 diabetes mellitus without complications: Secondary | ICD-10-CM | POA: Diagnosis not present

## 2015-01-01 DIAGNOSIS — N508 Other specified disorders of male genital organs: Secondary | ICD-10-CM | POA: Diagnosis present

## 2015-01-01 LAB — URINALYSIS, ROUTINE W REFLEX MICROSCOPIC
Bilirubin Urine: NEGATIVE
HGB URINE DIPSTICK: NEGATIVE
KETONES UR: NEGATIVE mg/dL
LEUKOCYTES UA: NEGATIVE
Nitrite: NEGATIVE
PH: 5 (ref 5.0–8.0)
Protein, ur: NEGATIVE mg/dL
Specific Gravity, Urine: 1.045 — ABNORMAL HIGH (ref 1.005–1.030)
Urobilinogen, UA: 0.2 mg/dL (ref 0.0–1.0)

## 2015-01-01 LAB — URINE MICROSCOPIC-ADD ON

## 2015-01-01 IMAGING — US US SCROTUM
1 series · 14 of 25 positions shown · non-contrast
Comparison: None.

CLINICAL DATA: Right testicular pain.

EXAM:
SCROTAL ULTRASOUND
DOPPLER ULTRASOUND OF THE TESTICLES
TECHNIQUE: Complete ultrasound examination of the testicles, epididymis, and
other scrotal structures was performed. Color and spectral Doppler
ultrasound were also utilized to evaluate blood flow to the
testicles.

[Series 1: us scrotum · 0.04mm/px · 14 of 53 slices shown]
[im 1/53]
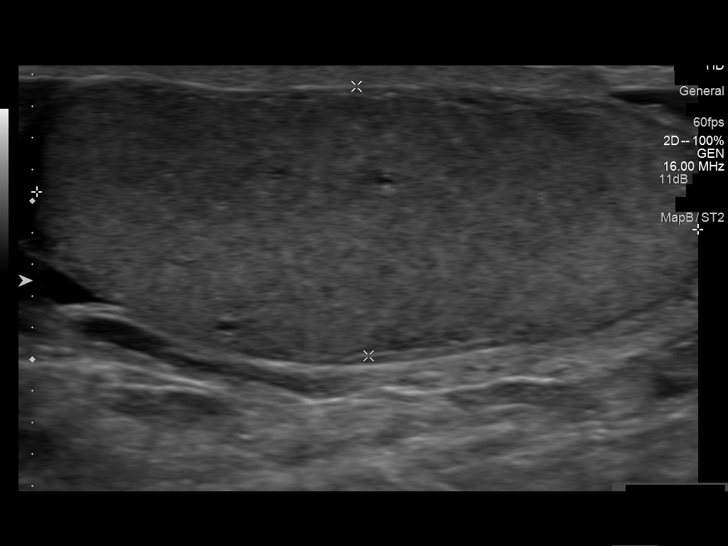
[im 5/53]
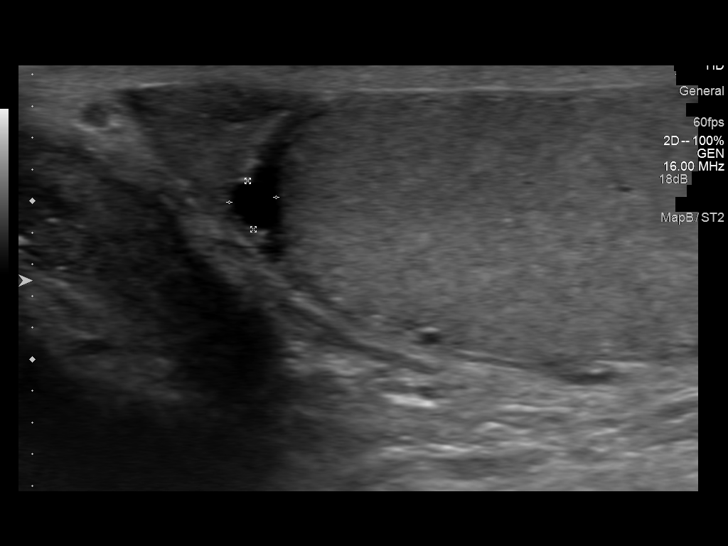
[im 9/53]
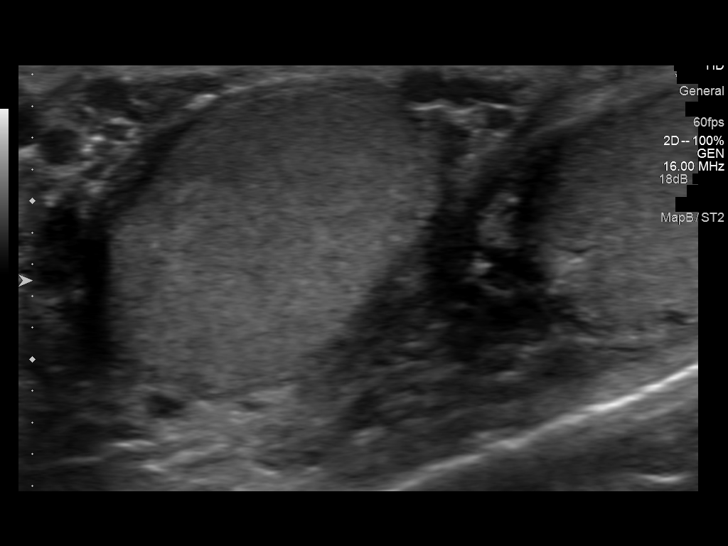
[im 14/53]
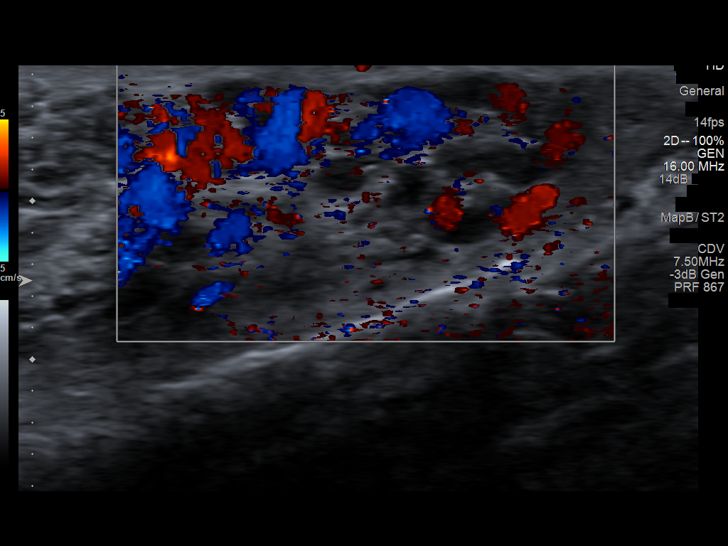
[im 18/53]
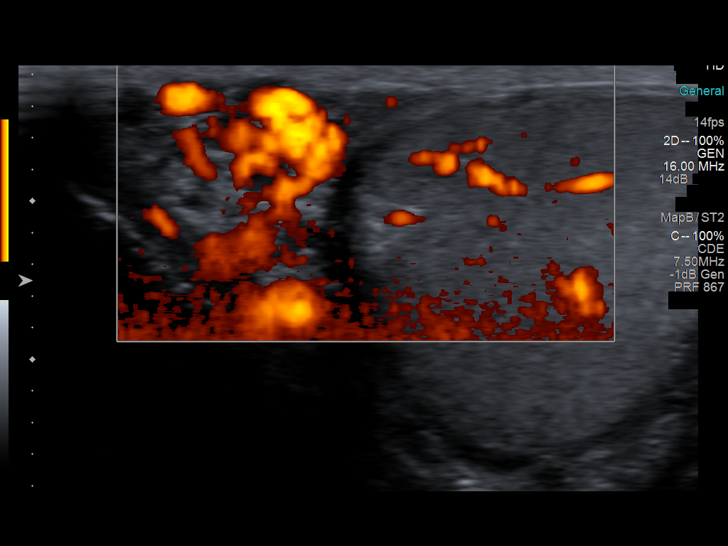
[im 20/53]
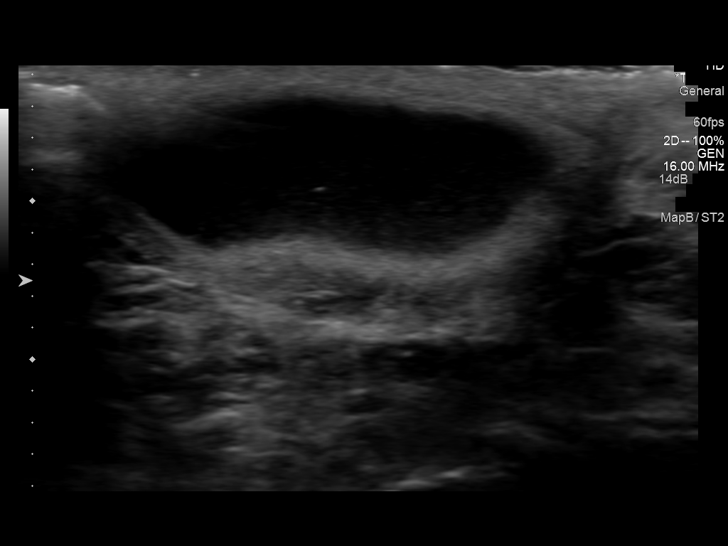
[im 24/53]
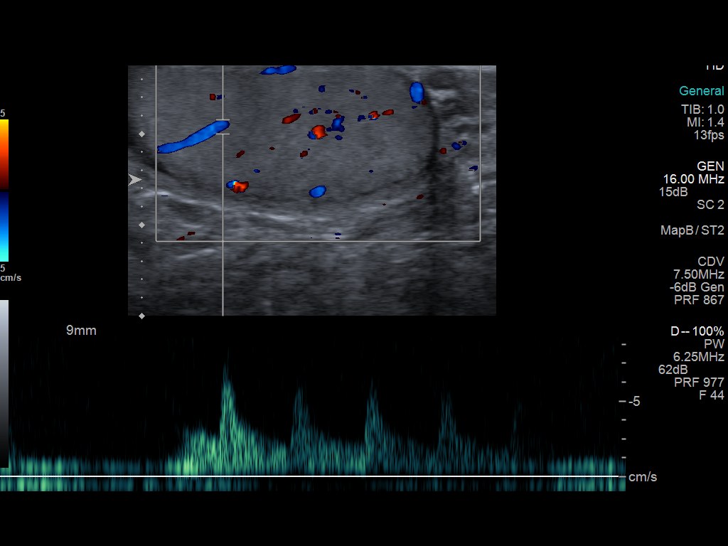
[im 29/53]
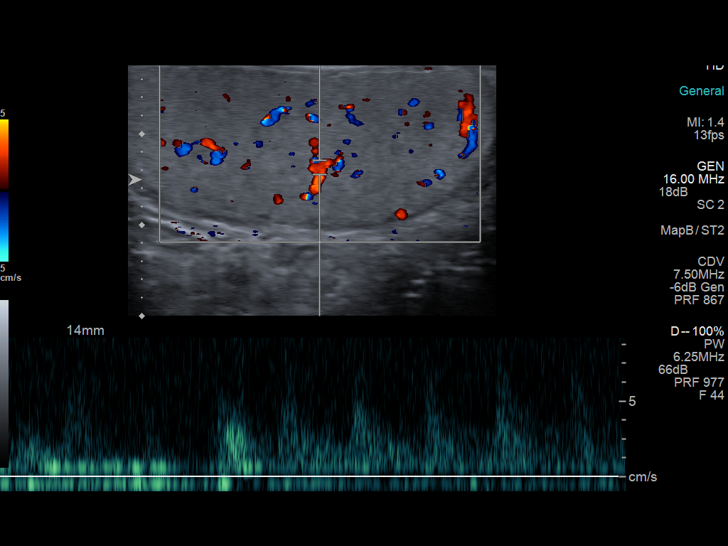
[im 33/53]
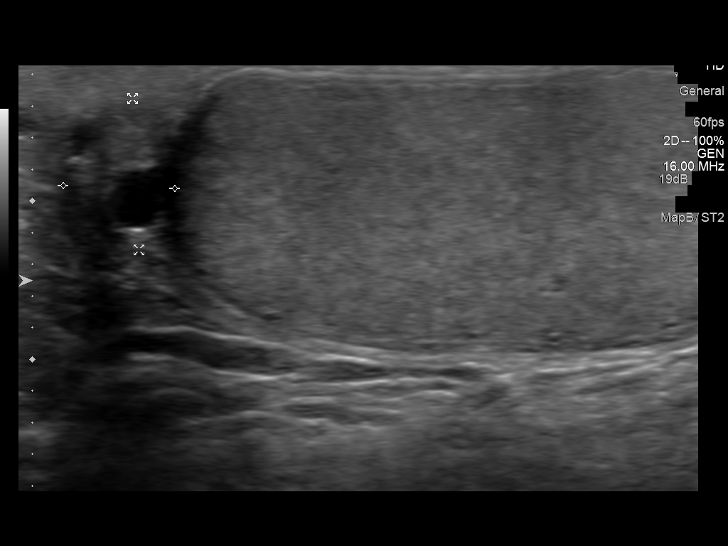
[im 35/53]
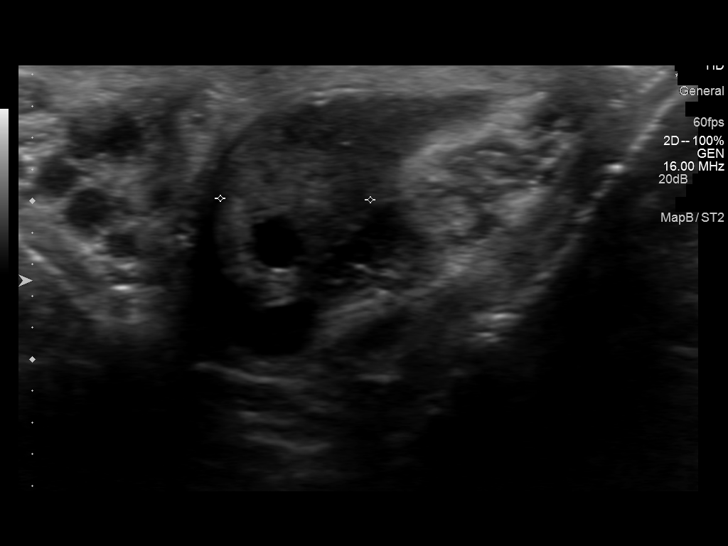
[im 40/53]
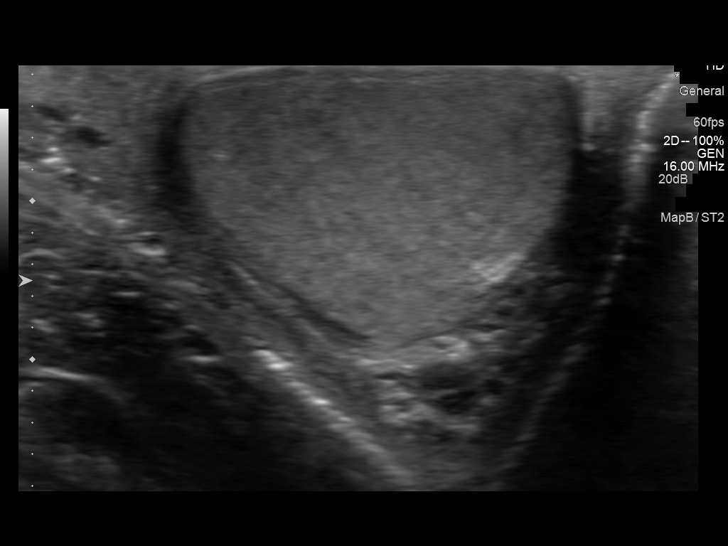
[im 44/53]
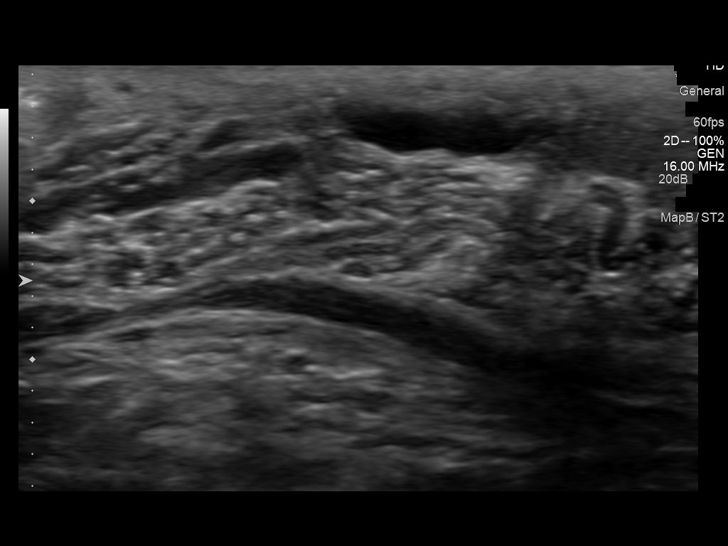
[im 48/53]
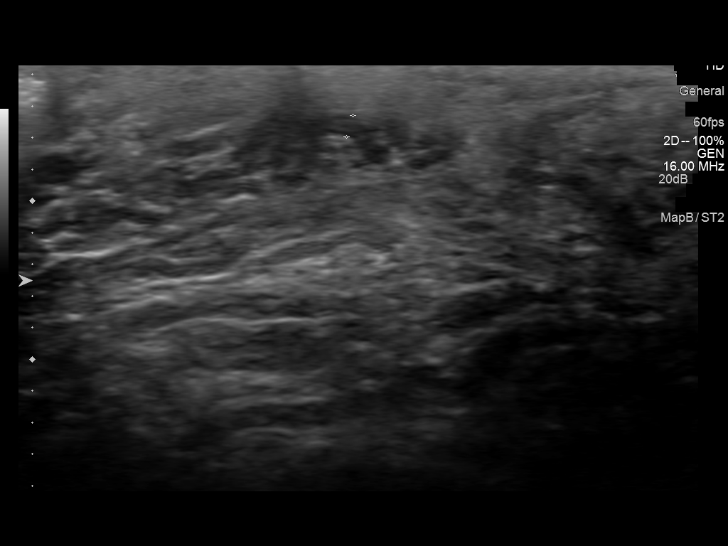
[im 53/53]
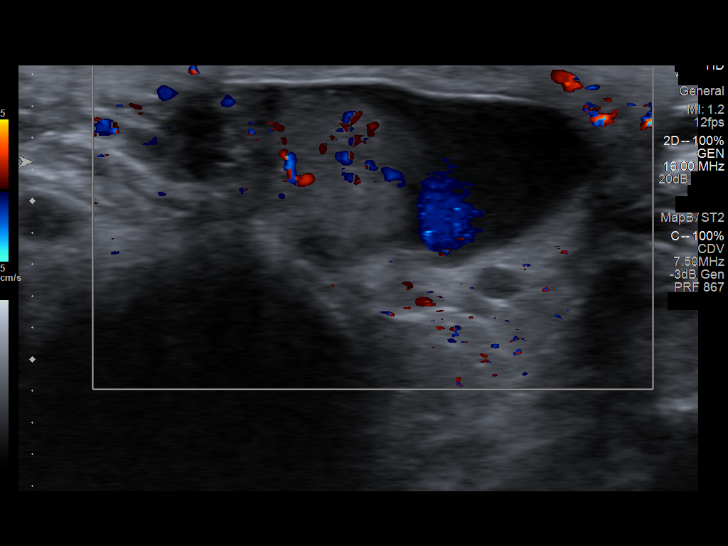

[14 of 25 positions shown; findings below may reference images not displayed]

FINDINGS: Right testicle

Measurements: 4.1 x 1.7 x 2.5 cm. No mass or microlithiasis
visualized.

Left testicle

Measurements: 4.2 x 1.5 x 12.3 cm. No mass or microlithiasis
visualized.

Right epididymis: Normal in size and appearance. 3 mm right
spermatocele.

Left epididymis: Normal in size and appearance. 3 mm left
spermatocele.

Hydrocele:  Small right hydrocele.  No left hydrocele.

Varicocele:  Right varicocele.  No left varicocele.

Pulsed Doppler interrogation of both testes demonstrates normal low
resistance arterial and venous waveforms bilaterally.
IMPRESSION: 1. No testicular torsion.
2. Right varicocele.
3. Small right hydrocele.

## 2015-01-01 MED ORDER — TRAMADOL HCL 50 MG PO TABS: 50.0000 mg | ORAL_TABLET | Freq: Four times a day (QID) | ORAL | Status: DC | PRN

## 2015-01-01 NOTE — ED Notes (Signed)
Patient transported to Ultrasound 

## 2015-01-01 NOTE — ED Provider Notes (Signed)
CSN: [id number]     Arrival date & time 01/01/15  1047 History   First MD Initiated Contact with Patient 01/01/15 1049     No chief complaint on file.    (Consider location/radiation/quality/duration/timing/severity/associated sxs/prior Treatment) HPI   59 year old male with history of diabetes, and hypertension presenting with complaints of testicle pain. Patient reports gradual onset of pain to his testicles and penis ongoing for the past 2 weeks. He described an achy sensation to both testicles right greater than left with occasional sharp shooting pain across testicles and penis. Symptom has remained the same intensity. Pain is mild but the achiness is constant. There is no associated fever, abdominal pain, back pain, dysuria, hematuria, penile discharge, or rash. He reported not sexually active. No specific treatment tried. Nothing seems to make it better or worse. He denies any prior history of STD. Patient was seen in the ED on February 25 for similar complaint. He refuses genital exam and was instructed to follow up with urologist. He did attempt to follow-up with urologist as recommended however states that he requires a referral from his PCP. He does have an appointment with his PCP on March 11.  Past Medical History  Diagnosis Date  . Diabetes mellitus without complication   . Hypertension    Past Surgical History  Procedure Laterality Date  . Coloscopy       POLYP   Family History  Problem Relation Age of Onset  . Cancer Mother   . Diabetes Mother   . Hypertension Mother   . Cancer Father    History  Substance Use Topics  . Smoking status: Current Every Day Smoker -- 1.00 packs/day for 40 years  . Smokeless tobacco: Not on file  . Alcohol Use: 0.0 oz/week    0 Standard drinks or equivalent per week    Review of Systems  Constitutional: Negative for fever.  Genitourinary: Positive for penile pain and testicular pain. Negative for dysuria, penile swelling and scrotal  swelling.  Skin: Negative for rash.      Allergies  Review of patient's allergies indicates not on file.  Home Medications   Prior to Admission medications   Medication Sig Start Date End Date Taking? Authorizing Provider  amLODipine (NORVASC) 10 MG tablet Take 1 tablet (10 mg total) by mouth daily. 10/02/14   Doris Cheadleeepak Advani, MD  aspirin EC 81 MG tablet Take 81 mg by mouth daily.    Historical Provider, MD  atenolol-chlorthalidone (TENORETIC) 100-25 MG per tablet Take 1 tablet by mouth daily. 10/02/14   Doris Cheadleeepak Advani, MD  cloNIDine (CATAPRES) 0.1 MG tablet Take 0.1 mg by mouth 2 (two) times daily.    Historical Provider, MD  Dapagliflozin Propanediol (FARXIGA PO) Take by mouth.    Historical Provider, MD  insulin glargine (LANTUS) 100 UNIT/ML injection Inject 0.45 mLs (45 Units total) into the skin at bedtime. 10/02/14   Doris Cheadleeepak Advani, MD  insulin NPH-regular Human (NOVOLIN 70/30) (70-30) 100 UNIT/ML injection Inject 40 Units into the skin 2 (two) times daily with a meal. 10/02/14   Doris Cheadleeepak Advani, MD  lisinopril (PRINIVIL,ZESTRIL) 40 MG tablet Take 1 tablet (40 mg total) by mouth daily. 10/02/14   Doris Cheadleeepak Advani, MD  Sildenafil Citrate (VIAGRA PO) Take by mouth.    Historical Provider, MD  varenicline (CHANTIX STARTING MONTH PAK) 0.5 MG X 11 & 1 MG X 42 tablet Take one 0.5 mg tablet by mouth once daily for 3 days, then increase to one 0.5 mg tablet twice daily  for 4 days, then increase to one 1 mg tablet twice daily. 10/19/14   Doris Cheadle, MD  Zolpidem Tartrate (AMBIEN PO) Take by mouth.    Historical Provider, MD   There were no vitals taken for this visit. Physical Exam  Constitutional: He appears well-developed and well-nourished. No distress.  HENT:  Head: Atraumatic.  Eyes: Conjunctivae are normal.  Neck: Normal range of motion. Neck supple.  Genitourinary:  Chaperone present during exam: No inguinal hernia or inguinal lymphadenopathy noted. Right testicle with tenderness to  palpation but no mass, cords, or scrotal swelling noted. Testicle with normal lie, intact from cremasteric reflex. Left testicle is nontender. Normal penile shaft no discharge and penis nontender.  Neurological: He is alert.  Skin: No rash noted.  Psychiatric: He has a normal mood and affect.    ED Course  Procedures (including critical care time)  11:13 AM Patient here with persistent testicles pain. Reproducible pain to right testicle without any significant swelling no abscess noted. No obvious signs of infection. No hernia noted. Plan to obtain scrotal ultrasound to rule out orchitis, epididymitis, testicular torsion or other urologic emergency.  1:38 PM UA without evidence of urinary tract infection. Scrotal ultrasound shows no evidence of testicular torsion. Evidence of right varicocele and small right hydrocele. This finding may account to patient's pain. Recommend ibuprofen or Tylenol at home however patient states he has tried A medication with minimal relief, we'll prescribe a short course of tramadol for pain. Recommend patient to follow-up with PCP for further care. Return precautions discussed. Patient voiced understanding and agrees with plan.  Labs Review Labs Reviewed  URINALYSIS, ROUTINE W REFLEX MICROSCOPIC - Abnormal; Notable for the following:    Specific Gravity, Urine 1.045 (*)    Glucose, UA >1000 (*)    All other components within normal limits  URINE MICROSCOPIC-ADD ON  GC/CHLAMYDIA PROBE AMP ([place])    Imaging Review US Scrotum  01/01/2015   CLINICAL DATA:  Right testicular pain.  EXAM: SCROTAL ULTRASOUND  DOPPLER ULTRASOUND OF THE TESTICLES  TECHNIQUE: Complete ultrasound examination of the testicles, epididymis, and other scrotal structures was performed. Color and spectral Doppler ultrasound were also utilized to evaluate blood flow to the testicles.  COMPARISON:  None.  FINDINGS: Right testicle  Measurements: 4.1 x 1.7 x 2.5 cm. No mass or microlithiasis  visualized.  Left testicle  Measurements: 4.2 x 1.5 x 12.3 cm. No mass or microlithiasis visualized.  Right epididymis: Normal in size and appearance. 3 mm right spermatocele.  Left epididymis: Normal in size and appearance. 3 mm left spermatocele.  Hydrocele:  Small right hydrocele.  No left hydrocele.  Varicocele:  Right varicocele.  No left varicocele.  Pulsed Doppler interrogation of both testes demonstrates normal low resistance arterial and venous waveforms bilaterally.  IMPRESSION: 1. No testicular torsion. 2. Right varicocele. 3. Small right hydrocele.   Electronically Signed   By: Elige Ko   On: 01/01/2015 12:39   [place] Art/ven Flow Abd Pelv Doppler  01/01/2015   CLINICAL DATA:  Right testicular pain.  EXAM: SCROTAL ULTRASOUND  DOPPLER ULTRASOUND OF THE TESTICLES  TECHNIQUE: Complete ultrasound examination of the testicles, epididymis, and other scrotal structures was performed. Color and spectral Doppler ultrasound were also utilized to evaluate blood flow to the testicles.  COMPARISON:  None.  FINDINGS: Right testicle  Measurements: 4.1 x 1.7 x 2.5 cm. No mass or microlithiasis visualized.  Left testicle  Measurements: 4.2 x 1.5 x 12.3 cm. No mass or microlithiasis  visualized.  Right epididymis: Normal in size and appearance. 3 mm right spermatocele.  Left epididymis: Normal in size and appearance. 3 mm left spermatocele.  Hydrocele:  Small right hydrocele.  No left hydrocele.  Varicocele:  Right varicocele.  No left varicocele.  Pulsed Doppler interrogation of both testes demonstrates normal low resistance arterial and venous waveforms bilaterally.  IMPRESSION: 1. No testicular torsion. 2. Right varicocele. 3. Small right hydrocele.   Electronically Signed   By: Elige Ko   On: 01/01/2015 12:39     EKG Interpretation None      MDM   Final diagnoses:  Right varicocele    BP 120/78 mmHg  Pulse 76  Temp(Src) 98.2 F (36.8 C) (Oral)  Resp 17  SpO2 98%  I have reviewed nursing notes  and vital signs. I personally reviewed the imaging tests through PACS system  I reviewed available ER/hospitalization records thought the EMR     Fayrene Helper, PA-C 01/01/15 1339  Raeford Razor, MD 01/02/15 [phone]

## 2015-01-01 NOTE — ED Notes (Signed)
Pt states he does not want to be on monitor, waiting for US results and he is planning on going home. This Rn explained to pt need to be placed on monitor, pt getting dressed.

## 2015-01-01 NOTE — ED Notes (Signed)
Pt requesting something to drink, this RN explained to pt that he will need to remain NPO until after US for testicular pain. Pt also informed that he will need to get in a gown for assessment.

## 2015-01-01 NOTE — Discharge Instructions (Signed)
Varicocele A varicocele is a swelling of veins in the scrotum (the bag of skin that contains the testicles). It is most common in young men. It occurs most often on the left side. Small or painless varicoceles do not need treatment. Most often, this is not a serious problem, but further tests may be needed to confirm the diagnosis. Surgery may be needed if complications of varicoceles arise. Rarely, varicoceles can reoccur after surgery. CAUSES  The swelling is due to blood backing up in the vein that leads from the testicle back to the body. Blood backs up because the valves inside the vein are not working properly. Veins normally return blood to the heart. Valves in veins are supposed to be one-way valves. They should not allow blood to flow backwards. If the valves do not work well, blood can pool in a vein and make it swell. The same thing happens with varicose veins in the leg. SYMPTOMS  A varicocele most often causes no symptoms. When they occur, symptoms include:   Swelling on one side of the scrotum.  Swelling that is more obvious when standing up.  A lumpy feeling in the scrotum.  Heaviness on one side of the scrotum.  Dull ache in the scrotum, especially after exercise or prolonged standing or sitting.  Slower growth or reduced size of the testicle on the side of the varicocele (in young males).  Problems with fertility can arise if the testicle does not grow normally. DIAGNOSIS  Varicocele is usually diagnosed by a physical exam. Sometimes ultrasonography is done. TREATMENT  Usually, varicoceles need no treatment. They are often routinely monitored on exam by your caregiver to ensure they do not slow the growth of the testicle on that side. Treatment may be needed if:  The varicocele is large.  There is a lot of pain.  The varicocele causes a decrease in the size of the testicle in a growing adolescent.  The other testicle is absent or not normal.  Varicoceles are found on  both sides of the scrotum.  There is pain when exercising.  There are fertility problems. There are two types of treatment:  Surgery. The surgeon ties off the swollen veins. Surgery may be done with an incision in the skin or through a laparoscope. The surgery is usually done in an outpatient setting. Outpatient means there is no overnight stay in a hospital.  Embolization. A small tube is placed in a vein and guided into the swollen veins. X-rays are used to guide the small tube. Tiny metal coils or other blocking items are put through the tube. This blocks swollen veins and the flow of blood. This is usually done in an outpatient setting without the use of general anesthesia. HOME CARE INSTRUCTIONS  To decrease discomfort:  Wear supportive underwear.  Use an athletic supporter for sports.  Only take over-the-counter or prescription medicines for pain or discomfort as directed by your caregiver. SEEK MEDICAL CARE IF:   Pain is increasing.  Swelling does not decrease when lying down.  Testicle is smaller.  The testicle becomes enlarged, swollen, red, or painful. Document Released: 01/22/2001 Document Revised: 01/08/2012 Document Reviewed: 01/26/2010 Exit[employer] Patient Information 2015 [employer]. This information is not intended to replace advice given to you by your health care provider. Make sure you discuss any questions you have with your health care provider.  

## 2015-01-01 NOTE — ED Notes (Signed)
Pt here for testicular pain. Denies trauma to area. States he hasn't done "anything out of the ordinary." Pain has been present x2 weeks. Denies swelling. A/Ox4.

## 2015-01-04 LAB — GC/CHLAMYDIA PROBE AMP (~~LOC~~) NOT AT ARMC
CHLAMYDIA, DNA PROBE: NEGATIVE
Neisseria Gonorrhea: NEGATIVE

## 2015-01-08 ENCOUNTER — Ambulatory Visit: Payer: Medicaid Other | Attending: Internal Medicine | Admitting: Internal Medicine

## 2015-01-08 ENCOUNTER — Encounter: Payer: Self-pay | Admitting: Internal Medicine

## 2015-01-08 VITALS — BP 144/94 | HR 74 | Temp 98.0°F | Resp 16 | Wt 153.2 lb

## 2015-01-08 DIAGNOSIS — N289 Disorder of kidney and ureter, unspecified: Secondary | ICD-10-CM | POA: Insufficient documentation

## 2015-01-08 DIAGNOSIS — E139 Other specified diabetes mellitus without complications: Secondary | ICD-10-CM

## 2015-01-08 DIAGNOSIS — I10 Essential (primary) hypertension: Secondary | ICD-10-CM | POA: Diagnosis not present

## 2015-01-08 DIAGNOSIS — I861 Scrotal varices: Secondary | ICD-10-CM

## 2015-01-08 DIAGNOSIS — E119 Type 2 diabetes mellitus without complications: Secondary | ICD-10-CM | POA: Diagnosis present

## 2015-01-08 LAB — COMPLETE METABOLIC PANEL WITH GFR
ALBUMIN: 4.2 g/dL (ref 3.5–5.2)
ALT: 31 U/L (ref 0–53)
AST: 18 U/L (ref 0–37)
Alkaline Phosphatase: 65 U/L (ref 39–117)
BILIRUBIN TOTAL: 0.4 mg/dL (ref 0.2–1.2)
BUN: 15 mg/dL (ref 6–23)
CALCIUM: 10 mg/dL (ref 8.4–10.5)
CHLORIDE: 95 meq/L — AB (ref 96–112)
CO2: 28 meq/L (ref 19–32)
Creat: 1.21 mg/dL (ref 0.50–1.35)
GFR, EST AFRICAN AMERICAN: 76 mL/min
GFR, EST NON AFRICAN AMERICAN: 66 mL/min
GLUCOSE: 323 mg/dL — AB (ref 70–99)
POTASSIUM: 4.1 meq/L (ref 3.5–5.3)
Sodium: 136 mEq/L (ref 135–145)
Total Protein: 7.3 g/dL (ref 6.0–8.3)

## 2015-01-08 LAB — POCT GLYCOSYLATED HEMOGLOBIN (HGB A1C): Hemoglobin A1C: 9.4

## 2015-01-08 LAB — GLUCOSE, POCT (MANUAL RESULT ENTRY): POC GLUCOSE: 333 mg/dL — AB (ref 70–99)

## 2015-01-08 LAB — LIPID PANEL
Cholesterol: 174 mg/dL (ref 0–200)
HDL: 44 mg/dL (ref >=40)
LDL Cholesterol: 110 mg/dL — ABNORMAL HIGH (ref 0–99)
Total CHOL/HDL Ratio: 4 Ratio
Triglycerides: 102 mg/dL (ref <150)
VLDL: 20 mg/dL (ref 0–40)

## 2015-01-08 NOTE — Patient Instructions (Signed)
Diabetes Mellitus and Food It is important for you to manage your blood sugar (glucose) level. Your blood glucose level can be greatly affected by what you eat. Eating healthier foods in the appropriate amounts throughout the day at about the same time each day will help you control your blood glucose level. It can also help slow or prevent worsening of your diabetes mellitus. Healthy eating may even help you improve the level of your blood pressure and reach or maintain a healthy weight.  HOW CAN FOOD AFFECT ME? Carbohydrates Carbohydrates affect your blood glucose level more than any other type of food. Your dietitian will help you determine how many carbohydrates to eat at each meal and teach you how to count carbohydrates. Counting carbohydrates is important to keep your blood glucose at a healthy level, especially if you are using insulin or taking certain medicines for diabetes mellitus. Alcohol Alcohol can cause sudden decreases in blood glucose (hypoglycemia), especially if you use insulin or take certain medicines for diabetes mellitus. Hypoglycemia can be a life-threatening condition. Symptoms of hypoglycemia (sleepiness, dizziness, and disorientation) are similar to symptoms of having too much alcohol.  If your health care provider has given you approval to drink alcohol, do so in moderation and use the following guidelines:  Women should not have more than one drink per day, and men should not have more than two drinks per day. One drink is equal to:  12 oz of beer.  5 oz of wine.  1 oz of hard liquor.  Do not drink on an empty stomach.  Keep yourself hydrated. Have water, diet soda, or unsweetened iced tea.  Regular soda, juice, and other mixers might contain a lot of carbohydrates and should be counted. WHAT FOODS ARE NOT RECOMMENDED? As you make food choices, it is important to remember that all foods are not the same. Some foods have fewer nutrients per serving than other  foods, even though they might have the same number of calories or carbohydrates. It is difficult to get your body what it needs when you eat foods with fewer nutrients. Examples of foods that you should avoid that are high in calories and carbohydrates but low in nutrients include:  Trans fats (most processed foods list trans fats on the Nutrition Facts label).  Regular soda.  Juice.  Candy.  Sweets, such as cake, pie, doughnuts, and cookies.  Fried foods. WHAT FOODS CAN I EAT? Have nutrient-rich foods, which will nourish your body and keep you healthy. The food you should eat also will depend on several factors, including:  The calories you need.  The medicines you take.  Your weight.  Your blood glucose level.  Your blood pressure level.  Your cholesterol level. You also should eat a variety of foods, including:  Protein, such as meat, poultry, fish, tofu, nuts, and seeds (lean animal proteins are best).  Fruits.  Vegetables.  Dairy products, such as milk, cheese, and yogurt (low fat is best).  Breads, grains, pasta, cereal, rice, and beans.  Fats such as olive oil, trans fat-free margarine, canola oil, avocado, and olives. DOES EVERYONE WITH DIABETES MELLITUS HAVE THE SAME MEAL PLAN? Because every person with diabetes mellitus is different, there is not one meal plan that works for everyone. It is very important that you meet with a dietitian who will help you create a meal plan that is just right for you. Document Released: 07/13/2005 Document Revised: 10/21/2013 Document Reviewed: 09/12/2013 ExitCare Patient Information 2015 [employer]. This   information is not intended to replace advice given to you by your health care provider. Make sure you discuss any questions you have with your health care provider. DASH Eating Plan DASH stands for "Dietary Approaches to Stop Hypertension." The DASH eating plan is a healthy eating plan that has been shown to reduce high  blood pressure (hypertension). Additional health benefits may include reducing the risk of type 2 diabetes mellitus, heart disease, and stroke. The DASH eating plan may also help with weight loss. WHAT DO I NEED TO KNOW ABOUT THE DASH EATING PLAN? For the DASH eating plan, you will follow these general guidelines:  Choose foods with a percent daily value for sodium of less than 5% (as listed on the food label).  Use salt-free seasonings or herbs instead of table salt or sea salt.  Check with your health care provider or pharmacist before using salt substitutes.  Eat lower-sodium products, often labeled as "lower sodium" or "no salt added."  Eat fresh foods.  Eat more vegetables, fruits, and low-fat dairy products.  Choose whole grains. Look for the word "whole" as the first word in the ingredient list.  Choose fish and skinless chicken or turkey more often than red meat. Limit fish, poultry, and meat to 6 oz (170 g) each day.  Limit sweets, desserts, sugars, and sugary drinks.  Choose heart-healthy fats.  Limit cheese to 1 oz (28 g) per day.  Eat more home-cooked food and less restaurant, buffet, and fast food.  Limit fried foods.  Cook foods using methods other than frying.  Limit canned vegetables. If you do use them, rinse them well to decrease the sodium.  When eating at a restaurant, ask that your food be prepared with less salt, or no salt if possible. WHAT FOODS CAN I EAT? Seek help from a dietitian for individual calorie needs. Grains Whole grain or whole wheat bread. Szczygiel rice. Whole grain or whole wheat pasta. Quinoa, bulgur, and whole grain cereals. Low-sodium cereals. Corn or whole wheat flour tortillas. Whole grain cornbread. Whole grain crackers. Low-sodium crackers. Vegetables Fresh or frozen vegetables (raw, steamed, roasted, or grilled). Low-sodium or reduced-sodium tomato and vegetable juices. Low-sodium or reduced-sodium tomato sauce and paste. Low-sodium  or reduced-sodium canned vegetables.  Fruits All fresh, canned (in natural juice), or frozen fruits. Meat and Other Protein Products Ground beef (85% or leaner), grass-fed beef, or beef trimmed of fat. Skinless chicken or turkey. Ground chicken or turkey. Pork trimmed of fat. All fish and seafood. Eggs. Dried beans, peas, or lentils. Unsalted nuts and seeds. Unsalted canned beans. Dairy Low-fat dairy products, such as skim or 1% milk, 2% or reduced-fat cheeses, low-fat ricotta or cottage cheese, or plain low-fat yogurt. Low-sodium or reduced-sodium cheeses. Fats and Oils Tub margarines without trans fats. Light or reduced-fat mayonnaise and salad dressings (reduced sodium). Avocado. Safflower, olive, or canola oils. Natural peanut or almond butter. Other Unsalted popcorn and pretzels. The items listed above may not be a complete list of recommended foods or beverages. Contact your dietitian for more options. WHAT FOODS ARE NOT RECOMMENDED? Grains White bread. White pasta. White rice. Refined cornbread. Bagels and croissants. Crackers that contain trans fat. Vegetables Creamed or fried vegetables. Vegetables in a cheese sauce. Regular canned vegetables. Regular canned tomato sauce and paste. Regular tomato and vegetable juices. Fruits Dried fruits. Canned fruit in light or heavy syrup. Fruit juice. Meat and Other Protein Products Fatty cuts of meat. Ribs, chicken wings, bacon, sausage, bologna, salami, chitterlings, fatback, hot   dogs, bratwurst, and packaged luncheon meats. Salted nuts and seeds. Canned beans with salt. Dairy Whole or 2% milk, cream, half-and-half, and cream cheese. Whole-fat or sweetened yogurt. Full-fat cheeses or blue cheese. Nondairy creamers and whipped toppings. Processed cheese, cheese spreads, or cheese curds. Condiments Onion and garlic salt, seasoned salt, table salt, and sea salt. Canned and packaged gravies. Worcestershire sauce. Tartar sauce. Barbecue sauce.  Teriyaki sauce. Soy sauce, including reduced sodium. Steak sauce. Fish sauce. Oyster sauce. Cocktail sauce. Horseradish. Ketchup and mustard. Meat flavorings and tenderizers. Bouillon cubes. Hot sauce. Tabasco sauce. Marinades. Taco seasonings. Relishes. Fats and Oils Butter, stick margarine, lard, shortening, ghee, and bacon fat. Coconut, palm kernel, or palm oils. Regular salad dressings. Other Pickles and olives. Salted popcorn and pretzels. The items listed above may not be a complete list of foods and beverages to avoid. Contact your dietitian for more information. WHERE CAN I FIND MORE INFORMATION? National Heart, Lung, and Blood Institute: www.nhlbi.nih.gov/health/health-topics/topics/dash/ Document Released: 10/05/2011 Document Revised: 03/02/2014 Document Reviewed: 08/20/2013 ExitCare Patient Information 2015 [employer]. This information is not intended to replace advice given to you by your health care provider. Make sure you discuss any questions you have with your health care provider.  

## 2015-01-08 NOTE — Progress Notes (Signed)
Patient here for follow up on his HTN and diabetes Patient stated he has not yet taken his medication today Presents with elevated blood sugar

## 2015-01-08 NOTE — Progress Notes (Signed)
MRN: [id number] Name: Manuel Kramer  Sex: male Age: 59 y.o. DOB: 1956/07/06  Allergies: Review of patient's allergies indicates no known allergies.  Chief Complaint  Patient presents with  . Follow-up    HPI: Patient is 59 y.o. male who has history of hypertension, diabetes, renal insufficiency, patient comes today for followup as per patient he is taking Lantus 45 units and insulin 70/30 45 units twice a day, patient denies any hypoglycemic symptoms, today's blood pressure is borderline elevated as per patient he has not taken his medications today,patient also went to the emergency room recently with symptoms of testicular pain, EMR reviewed patient was found to have no torsion but had right varicocele, he was advised to see a urologist. Patient is requesting referral.  Past Medical History  Diagnosis Date  . Diabetes mellitus without complication   . Hypertension     Past Surgical History  Procedure Laterality Date  . Coloscopy       POLYP      Medication List       This list is accurate as of: 01/08/15 10:01 AM.  Always use your most recent med list.               amLODipine 10 MG tablet  Commonly known as:  NORVASC  Take 1 tablet (10 mg total) by mouth daily.     atenolol-chlorthalidone 100-25 MG per tablet  Commonly known as:  TENORETIC  Take 1 tablet by mouth daily.     cloNIDine 0.1 MG tablet  Commonly known as:  CATAPRES  Take 0.1 mg by mouth 2 (two) times daily.     ibuprofen 400 MG tablet  Commonly known as:  ADVIL,MOTRIN  Take 400 mg by mouth every 6 (six) hours as needed for mild pain.     insulin glargine 100 UNIT/ML injection  Commonly known as:  LANTUS  Inject 0.45 mLs (45 Units total) into the skin at bedtime.     insulin NPH-regular Human (70-30) 100 UNIT/ML injection  Commonly known as:  NOVOLIN 70/30  Inject 40 Units into the skin 2 (two) times daily with a meal.     lisinopril 40 MG tablet  Commonly known as:  PRINIVIL,ZESTRIL    Take 1 tablet (40 mg total) by mouth daily.     traMADol 50 MG tablet  Commonly known as:  ULTRAM  Take 1 tablet (50 mg total) by mouth every 6 (six) hours as needed.     varenicline 0.5 MG X 11 & 1 MG X 42 tablet  Commonly known as:  CHANTIX STARTING MONTH PAK  Take one 0.5 mg tablet by mouth once daily for 3 days, then increase to one 0.5 mg tablet twice daily for 4 days, then increase to one 1 mg tablet twice daily.        No orders of the defined types were placed in this encounter.     There is no immunization history on file for this patient.  Family History  Problem Relation Age of Onset  . Cancer Mother   . Diabetes Mother   . Hypertension Mother   . Cancer Father     History  Substance Use Topics  . Smoking status: Current Every Day Smoker -- 1.00 packs/day for 40 years  . Smokeless tobacco: Not on file  . Alcohol Use: 0.0 oz/week    0 Standard drinks or equivalent per week    Review of Systems  Constitutional: Negative for chills.  Respiratory:  Negative for wheezing.   Cardiovascular: Negative for chest pain and leg swelling.     As noted in HPI  Filed Vitals:   01/08/15 0943  BP: 144/94  Pulse: 74  Temp: 98 F (36.7 C)  Resp: 16    Physical Exam  Physical Exam  Constitutional: No distress.  Eyes: EOM are normal. Pupils are equal, round, and reactive to light.  Cardiovascular: Normal rate and regular rhythm.   Pulmonary/Chest: Breath sounds normal. No respiratory distress. He has no wheezes. He has no rales.  Musculoskeletal: He exhibits no edema.    CBC    Component Value Date/Time   WBC 7.4 10/19/2014 0955   RBC 5.22 10/19/2014 0955   HGB 16.8 10/19/2014 0955   HCT 49.2 10/19/2014 0955   PLT 291 10/19/2014 0955   MCV 94.3 10/19/2014 0955   LYMPHSABS 2.3 10/19/2014 0955   MONOABS 0.7 10/19/2014 0955   EOSABS 0.1 10/19/2014 0955   BASOSABS 0.0 10/19/2014 0955    CMP     Component Value Date/Time   NA 135 10/25/2007 0500   K  3.7 10/25/2007 0500   CL 99 10/25/2007 0500   CO2 32 10/25/2007 0500   GLUCOSE 121* 10/25/2007 0500   BUN 20 10/25/2007 0500   CREATININE 1.55* 10/25/2007 0500   CALCIUM 8.0* 10/25/2007 0500   PROT 5.6* 10/24/2007 1354   ALBUMIN 2.9* 10/24/2007 1354   AST 32 10/24/2007 1354   ALT 48 10/24/2007 1354   ALKPHOS 60 10/24/2007 1354   BILITOT 1.2 10/24/2007 1354   GFRNONAA 48* 10/25/2007 0500   GFRAA * 10/25/2007 0500    59        The eGFR has been calculated using the MDRD equation. This calculation has not been validated in all clinical    Lab Results  Component Value Date/Time   CHOL  08/17/2007 04:12 AM    118        ATP III CLASSIFICATION:  <200     mg/dL   Desirable  200-239  mg/dL   Borderline High  >=240    mg/dL   High    No components found for: HGA1C  Lab Results  Component Value Date/Time   AST 32 10/24/2007 01:54 PM    Assessment and Plan  Other specified diabetes mellitus without complications - Plan:  Results for orders placed or performed in visit on 01/08/15  Glucose (CBG)  Result Value Ref Range   POC Glucose 333 (A) 70 - 99 mg/dl  HgB A1c  Result Value Ref Range   Hemoglobin A1C 9.40    Glucose (CBG), HgB A1c, ordered Lipid panel  Essential hypertension Blood pressure is borderline elevated, advise patient for DASH diet since patient has not taken his medication today, we'll continue with the same followup on the next visit.  Renal insufficiency - Plan: COMPLETE METABOLIC PANEL WITH GFR  Right varicocele - Plan: Ambulatory referral to Urology   Health Maintenance  -Vaccinations: pateint declines flu shot will think about penumovax   Return in about 3 months (around 04/10/2015) for diabetes, hypertension.   This note has been created with Surveyor, quantity. Any transcriptional errors are unintentional.    Lorayne Marek, MD

## 2015-01-11 ENCOUNTER — Telehealth: Payer: Self-pay

## 2015-01-11 MED ORDER — ATORVASTATIN CALCIUM 10 MG PO TABS: 10.0000 mg | ORAL_TABLET | Freq: Every day | ORAL | Status: DC

## 2015-01-11 NOTE — Telephone Encounter (Signed)
-----   Message from Doris Cheadleeepak Advani, MD sent at 01/11/2015 10:08 AM EDT ----- Blood work reviewed, call and let the patient know that his kidney function is improved noticed elevated LDL>100, since patient is diabetic will start on statins, advise patient to start taking Lipitor 10 mg daily.

## 2015-01-11 NOTE — Telephone Encounter (Signed)
Patient not available Left message on voice mail to return our call 

## 2015-03-23 ENCOUNTER — Telehealth: Payer: Self-pay | Admitting: Internal Medicine

## 2015-03-23 NOTE — Telephone Encounter (Signed)
Pt calling to follow upon med refill request sent by [facility] for bp meds, please f/u with pt.

## 2015-03-24 NOTE — Telephone Encounter (Signed)
Patient called to request a med refill for his blood pressure medications, [facility] [address] Please f/u

## 2015-03-25 ENCOUNTER — Other Ambulatory Visit: Payer: Self-pay | Admitting: *Deleted

## 2015-03-25 DIAGNOSIS — I10 Essential (primary) hypertension: Secondary | ICD-10-CM

## 2015-03-25 MED ORDER — CLONIDINE HCL 0.1 MG PO TABS: 0.1000 mg | ORAL_TABLET | Freq: Two times a day (BID) | ORAL | Status: DC

## 2015-03-25 MED ORDER — AMLODIPINE BESYLATE 10 MG PO TABS: 10.0000 mg | ORAL_TABLET | Freq: Every day | ORAL | Status: DC

## 2015-03-25 MED ORDER — ATENOLOL-CHLORTHALIDONE 100-25 MG PO TABS: 1.0000 | ORAL_TABLET | Freq: Every day | ORAL | Status: DC

## 2015-03-25 MED ORDER — LISINOPRIL 40 MG PO TABS: 40.0000 mg | ORAL_TABLET | Freq: Every day | ORAL | Status: DC

## 2015-03-26 ENCOUNTER — Other Ambulatory Visit: Payer: Self-pay | Admitting: *Deleted

## 2015-05-07 ENCOUNTER — Telehealth: Payer: Self-pay | Admitting: Internal Medicine

## 2015-05-07 DIAGNOSIS — I10 Essential (primary) hypertension: Secondary | ICD-10-CM

## 2015-05-07 NOTE — Telephone Encounter (Signed)
Patient has called in today to request a medication refill for Amlodipine; please f/u with patient about this request

## 2015-05-07 NOTE — Telephone Encounter (Signed)
Patient called back and  would like scripts sent to [facility]; patient stated he has been out for a week

## 2015-05-10 MED ORDER — AMLODIPINE BESYLATE 10 MG PO TABS: 10.0000 mg | ORAL_TABLET | Freq: Every day | ORAL | Status: DC

## 2015-05-10 NOTE — Telephone Encounter (Signed)
Returned patient phone call Patient not available Left message with family member to return our call

## 2015-05-28 ENCOUNTER — Ambulatory Visit: Payer: Medicaid Other | Admitting: Family Medicine

## 2015-05-31 ENCOUNTER — Telehealth: Payer: Self-pay | Admitting: Internal Medicine

## 2015-05-31 NOTE — Telephone Encounter (Signed)
Received a fax from [facility] for a med refill on:   lisinopril (PRINIVIL,ZESTRIL) 40 MG tablet atenolol-chlorthalidone (TENORETIC) 100-25 MG per tablet

## 2015-06-01 ENCOUNTER — Telehealth: Payer: Self-pay

## 2015-06-01 DIAGNOSIS — I10 Essential (primary) hypertension: Secondary | ICD-10-CM

## 2015-06-01 MED ORDER — ATENOLOL-CHLORTHALIDONE 100-25 MG PO TABS: 1.0000 | ORAL_TABLET | Freq: Every day | ORAL | Status: DC

## 2015-06-01 MED ORDER — LISINOPRIL 40 MG PO TABS: 40.0000 mg | ORAL_TABLET | Freq: Every day | ORAL | Status: DC

## 2015-06-01 NOTE — Telephone Encounter (Signed)
Patient requesting refills on his lisinopril and atenolol Patient will be given a one month supply and needs to make an appointment for  Additional refills

## 2015-06-18 NOTE — Telephone Encounter (Signed)
1 month supply sent to pharmacy and patient has an appointment to see Dr. Armen Pickup on 06/21/15.

## 2015-06-21 ENCOUNTER — Ambulatory Visit: Payer: Medicaid Other | Admitting: Family Medicine

## 2015-07-01 ENCOUNTER — Encounter: Payer: Self-pay | Admitting: Internal Medicine

## 2015-07-01 ENCOUNTER — Ambulatory Visit: Payer: Medicaid Other | Attending: Internal Medicine | Admitting: Internal Medicine

## 2015-07-01 VITALS — BP 119/80 | HR 60 | Temp 98.8°F | Resp 16 | Wt 152.2 lb

## 2015-07-01 DIAGNOSIS — E1165 Type 2 diabetes mellitus with hyperglycemia: Secondary | ICD-10-CM | POA: Diagnosis present

## 2015-07-01 DIAGNOSIS — I10 Essential (primary) hypertension: Secondary | ICD-10-CM | POA: Insufficient documentation

## 2015-07-01 DIAGNOSIS — R37 Sexual dysfunction, unspecified: Secondary | ICD-10-CM

## 2015-07-01 DIAGNOSIS — Z79899 Other long term (current) drug therapy: Secondary | ICD-10-CM | POA: Diagnosis not present

## 2015-07-01 DIAGNOSIS — N539 Unspecified male sexual dysfunction: Secondary | ICD-10-CM | POA: Diagnosis not present

## 2015-07-01 DIAGNOSIS — Z794 Long term (current) use of insulin: Secondary | ICD-10-CM | POA: Insufficient documentation

## 2015-07-01 DIAGNOSIS — Z720 Tobacco use: Secondary | ICD-10-CM

## 2015-07-01 DIAGNOSIS — Z9114 Patient's other noncompliance with medication regimen: Secondary | ICD-10-CM | POA: Diagnosis not present

## 2015-07-01 DIAGNOSIS — E119 Type 2 diabetes mellitus without complications: Secondary | ICD-10-CM

## 2015-07-01 DIAGNOSIS — R5383 Other fatigue: Secondary | ICD-10-CM | POA: Diagnosis not present

## 2015-07-01 DIAGNOSIS — F17210 Nicotine dependence, cigarettes, uncomplicated: Secondary | ICD-10-CM | POA: Insufficient documentation

## 2015-07-01 DIAGNOSIS — F17200 Nicotine dependence, unspecified, uncomplicated: Secondary | ICD-10-CM

## 2015-07-01 LAB — COMPLETE METABOLIC PANEL WITH GFR
ALT: 47 U/L — ABNORMAL HIGH (ref 9–46)
AST: 26 U/L (ref 10–35)
Albumin: 4 g/dL (ref 3.6–5.1)
Alkaline Phosphatase: 66 U/L (ref 40–115)
BUN: 14 mg/dL (ref 7–25)
CHLORIDE: 95 mmol/L — AB (ref 98–110)
CO2: 31 mmol/L (ref 20–31)
Calcium: 9.5 mg/dL (ref 8.6–10.3)
Creat: 1.1 mg/dL (ref 0.70–1.33)
GFR, Est African American: 85 mL/min (ref >=60)
GFR, Est Non African American: 74 mL/min (ref >=60)
GLUCOSE: 387 mg/dL — AB (ref 65–99)
POTASSIUM: 4.1 mmol/L (ref 3.5–5.3)
SODIUM: 137 mmol/L (ref 135–146)
Total Bilirubin: 0.5 mg/dL (ref 0.2–1.2)
Total Protein: 6.6 g/dL (ref 6.1–8.1)

## 2015-07-01 LAB — POCT GLYCOSYLATED HEMOGLOBIN (HGB A1C): Hemoglobin A1C: 10.6

## 2015-07-01 LAB — GLUCOSE, POCT (MANUAL RESULT ENTRY): POC GLUCOSE: 360 mg/dL — AB (ref 70–99)

## 2015-07-01 LAB — TSH: TSH: 0.829 u[IU]/mL (ref 0.350–4.500)

## 2015-07-01 MED ORDER — NICOTINE 21 MG/24HR TD PT24: 21.0000 mg | MEDICATED_PATCH | Freq: Every day | TRANSDERMAL | Status: DC

## 2015-07-01 NOTE — Progress Notes (Signed)
Patient ID: Manuel Kramer, male   DOB: October 09, 1956, 59 y.o.   MRN: [id number]  CC: DM/HTN f/u   HPI: Manuel Kramer is a 59 y.o. male here today for a follow up visit.  Patient has past medical history of T2DM, HTN, and tobacco abuse. Patient reports that he has been on 45 units of Lantus nightly and Novolin 70/30 45 units twice per day. Patient reports that he has been on both long-acting insulin for the past 2 years. He has recently been out of Lantus for the past 2 months. He currently checks his blood sugars 4 times per day and his levels usually range around 200-300. He notices his sugars are higher upon awakening in the morning. Patient also notes that he has never had control of his diabetes and would like to see a diabetic specialist.  Patient is up-to-date on all health maintenance. Colonoscopy was last year in [place] and prostate exam was completed earlier this year by [facility]. Patient does complain of decreased sexual function, feeling fatigue, no energy, and decreased ability to exercise. Patient has been taking multiple multivitamins, ginseng, vitamin B-12 to help with feelings of fatigue.    No Known Allergies Past Medical History  Diagnosis Date  . Diabetes mellitus without complication   . Hypertension    Current Outpatient Prescriptions on File Prior to Visit  Medication Sig Dispense Refill  . amLODipine (NORVASC) 10 MG tablet Take 1 tablet (10 mg total) by mouth daily. 30 tablet 0  . atenolol-chlorthalidone (TENORETIC) 100-25 MG per tablet Take 1 tablet by mouth daily. 30 tablet 0  . cloNIDine (CATAPRES) 0.1 MG tablet Take 1 tablet (0.1 mg total) by mouth 2 (two) times daily. 60 tablet 0  . ibuprofen (ADVIL,MOTRIN) 400 MG tablet Take 400 mg by mouth every 6 (six) hours as needed for mild pain.    Marland Kitchen insulin glargine (LANTUS) 100 UNIT/ML injection Inject 0.45 mLs (45 Units total) into the skin at bedtime. 10 mL 3  . insulin NPH-regular Human (NOVOLIN 70/30)  (70-30) 100 UNIT/ML injection Inject 40 Units into the skin 2 (two) times daily with a meal. 10 mL 3  . lisinopril (PRINIVIL,ZESTRIL) 40 MG tablet Take 1 tablet (40 mg total) by mouth daily. 30 tablet 0  . atorvastatin (LIPITOR) 10 MG tablet Take 1 tablet (10 mg total) by mouth daily. 30 tablet 3  . traMADol (ULTRAM) 50 MG tablet Take 1 tablet (50 mg total) by mouth every 6 (six) hours as needed. (Patient not taking: Reported on 07/01/2015) 15 tablet 0  . varenicline (CHANTIX STARTING MONTH PAK) 0.5 MG X 11 & 1 MG X 42 tablet Take one 0.5 mg tablet by mouth once daily for 3 days, then increase to one 0.5 mg tablet twice daily for 4 days, then increase to one 1 mg tablet twice daily. (Patient not taking: Reported on 07/01/2015) 53 tablet 0   No current facility-administered medications on file prior to visit.   Family History  Problem Relation Age of Onset  . Cancer Mother   . Diabetes Mother   . Hypertension Mother   . Cancer Father    Social History   Social History  . Marital Status: Married    Spouse Name: N/A  . Number of Children: N/A  . Years of Education: N/A   Occupational History  . Not on file.   Social History Main Topics  . Smoking status: Current Every Day Smoker -- 1.00 packs/day for 40 years  . Smokeless  tobacco: Not on file  . Alcohol Use: 0.0 oz/week    0 Standard drinks or equivalent per week  . Drug Use: Not on file  . Sexual Activity: Not on file   Other Topics Concern  . Not on file   Social History Narrative   Review of Systems  Eyes: Positive for blurred vision.  Genitourinary: Negative for frequency.  Neurological: Negative for dizziness, tingling and headaches.  Endo/Heme/Allergies: Negative for polydipsia.   Objective:   Filed Vitals:   07/01/15 1053  BP: 119/80  Pulse: 60  Temp: 98.8 F (37.1 C)  Resp: 16    Physical Exam  Constitutional: He is oriented to person, place, and time.  Neck: No thyromegaly present.  Cardiovascular: Normal  rate, regular rhythm and normal heart sounds.   Pulmonary/Chest: Effort normal and breath sounds normal.  Musculoskeletal: He exhibits no edema.  Lymphadenopathy:    He has no cervical adenopathy.  Neurological: He is alert and oriented to person, place, and time.  Skin: Skin is warm and dry.  Psychiatric: He has a normal mood and affect.     Lab Results  Component Value Date   WBC 7.4 10/19/2014   HGB 16.8 10/19/2014   HCT 49.2 10/19/2014   MCV 94.3 10/19/2014   PLT 291 10/19/2014   Lab Results  Component Value Date   CREATININE 1.21 01/08/2015   BUN 15 01/08/2015   NA 136 01/08/2015   K 4.1 01/08/2015   CL 95* 01/08/2015   CO2 28 01/08/2015    Lab Results  Component Value Date   HGBA1C 9.40 01/08/2015   Lipid Panel     Component Value Date/Time   CHOL 174 01/08/2015 1000   TRIG 102 01/08/2015 1000   HDL 44 01/08/2015 1000   CHOLHDL 4.0 01/08/2015 1000   VLDL 20 01/08/2015 1000   LDLCALC 110* 01/08/2015 1000       Assessment and plan:   Jenna was seen today for follow-up.  Diagnoses and all orders for this visit:  Type 2 diabetes mellitus without complication -     Glucose (CBG) -     HgB A1c -     Ambulatory referral to Dentistry  -     Ambulatory referral to Ophthalmology -     Ambulatory referral to Endocrinology---uncontrolled DM -     COMPLETE METABOLIC PANEL WITH GFR Will send patient for diabetic eye exam and dental exam, when over importance of yearly exam.  Patients diabetes remains uncontrolled as evidence by hemoglobin a1c >8.  Patient has been non-compliant with medication regimen. Stressed the multiple complications associated with uncontrolled diabetes.  Patient will stay on current medication dose and report back to clinic with cbg log in 2 weeks. For now I will discontinue Lantus and have patient to manage strictly on Novolin 70/30. I'm unsure why patient was on dual long-acting insulin. Due to patient's history I will refer him to  endocrinology for better control of chronic disease.  Tobacco use disorder -     Begin nicotine (NICODERM CQ - DOSED IN MG/24 HOURS) 21 mg/24hr patch; Place 1 patch (21 mg total) onto the skin daily. I have praised patient for his desire to quit smoking as well as addressed increased risk for stroke heart disease relating to history of diabetes and hypertension. Also went over the risks for cancer.  Sexual dysfunction -     Testosterone Patient is requesting Viagra this time, but I have asked patient to hold off unless find  the source of his problem. I have explained that low testosterone may contribute to decreased sexual function as well as feelings of fatigue.  I've also addressed the need for strict glycemic control in order to help sexual function. If patient's testosterone is normal I will send prescription for Viagra  Other fatigue -     TSH See above, will also check thyroid function   Return in about 3 months (around 09/30/2015) for DM/HTN.       Ambrose Finland, NP-C [facility] [phone] 07/01/2015, 11:17 AM

## 2015-07-01 NOTE — Progress Notes (Signed)
Patient here for follow up on his diabetes and HTN Patient refused cbg and A1c stated he is tired of being "pricked" all The time

## 2015-07-01 NOTE — Patient Instructions (Signed)
You will receive a call from Opthalmology, Dentist, and Endocrinology.    Please stop smoking and begin taking cholesterol medication. I have sent your nicotine patches!!!

## 2015-07-02 ENCOUNTER — Encounter: Payer: Self-pay | Admitting: Internal Medicine

## 2015-07-02 LAB — TESTOSTERONE: TESTOSTERONE: 351 ng/dL (ref 300–890)

## 2015-07-05 ENCOUNTER — Other Ambulatory Visit: Payer: Self-pay | Admitting: Internal Medicine

## 2015-07-05 DIAGNOSIS — N521 Erectile dysfunction due to diseases classified elsewhere: Secondary | ICD-10-CM

## 2015-07-05 MED ORDER — SILDENAFIL CITRATE 25 MG PO TABS: 25.0000 mg | ORAL_TABLET | Freq: Every day | ORAL | Status: DC | PRN

## 2015-07-06 ENCOUNTER — Telehealth: Payer: Self-pay

## 2015-07-06 NOTE — Telephone Encounter (Signed)
-----   Message from Ambrose Finland, NP sent at 07/05/2015 10:35 AM EDT ----- Patient's labs are normal, testosterone on the low end of normal. I will send patient a few Viagra pills. Please explain to patient that if he begins to have chest pain or a erection lasting longer than 4 hours then he needs to go to the ER. He should not take any nitrates with this medication (used for chest pain) because it could be fatal.

## 2015-07-06 NOTE — Telephone Encounter (Signed)
Patient not available Left message on voice mail to return our call 

## 2015-07-07 NOTE — Telephone Encounter (Signed)
Patient called back please f/u at [phone]

## 2015-07-09 NOTE — Telephone Encounter (Signed)
Patient is requesting to obtain his results. Please follow up with pt.

## 2015-07-09 NOTE — Telephone Encounter (Signed)
Patient called to request results, please f/u at [phone].

## 2015-07-14 ENCOUNTER — Telehealth: Payer: Self-pay

## 2015-07-14 ENCOUNTER — Telehealth: Payer: Self-pay | Admitting: Internal Medicine

## 2015-07-14 NOTE — Telephone Encounter (Signed)
Returned patient phone Patient is aware of his lab results Patient said the viagra will help but he is still feeling fatigued and no energy Patient does have an appt. Scheduled for next week so i told him to keep it so he can  Talk to his provider

## 2015-07-14 NOTE — Telephone Encounter (Signed)
Patient called requesting to speak with nurse about lab results and testosterone. Please follow up.

## 2015-07-22 ENCOUNTER — Ambulatory Visit: Payer: Medicaid Other | Attending: Internal Medicine | Admitting: Internal Medicine

## 2015-07-22 ENCOUNTER — Encounter: Payer: Self-pay | Admitting: Internal Medicine

## 2015-07-22 VITALS — BP 131/95 | HR 78 | Temp 98.0°F | Resp 16 | Ht 66.0 in | Wt 153.0 lb

## 2015-07-22 DIAGNOSIS — E1165 Type 2 diabetes mellitus with hyperglycemia: Secondary | ICD-10-CM | POA: Diagnosis not present

## 2015-07-22 DIAGNOSIS — IMO0002 Reserved for concepts with insufficient information to code with codable children: Secondary | ICD-10-CM

## 2015-07-22 DIAGNOSIS — N521 Erectile dysfunction due to diseases classified elsewhere: Secondary | ICD-10-CM

## 2015-07-22 DIAGNOSIS — E291 Testicular hypofunction: Secondary | ICD-10-CM | POA: Diagnosis not present

## 2015-07-22 DIAGNOSIS — N508 Other specified disorders of male genital organs: Secondary | ICD-10-CM

## 2015-07-22 DIAGNOSIS — N5082 Scrotal pain: Secondary | ICD-10-CM

## 2015-07-22 DIAGNOSIS — R7989 Other specified abnormal findings of blood chemistry: Secondary | ICD-10-CM

## 2015-07-22 MED ORDER — TESTOSTERONE CYPIONATE 200 MG/ML IM SOLN: 200.0000 mg | INTRAMUSCULAR | Status: DC

## 2015-07-22 MED ORDER — TRAMADOL HCL 50 MG PO TABS: 50.0000 mg | ORAL_TABLET | Freq: Three times a day (TID) | ORAL | Status: DC | PRN

## 2015-07-22 MED ORDER — SILDENAFIL CITRATE 50 MG PO TABS: 50.0000 mg | ORAL_TABLET | Freq: Every day | ORAL | Status: DC | PRN

## 2015-07-22 NOTE — Progress Notes (Signed)
Patient ID: Manuel Kramer, male   DOB: [birth year], 59 y.o.   MRN: [id number]  CC: lab review  HPI: Manuel Kramer is a 59 y.o. male here today for a follow up visit.  Patient has past medical history of T2DM and HTN. Patient reports that he was called and told that his testosterone was low and he has questions about benefits of replacement therapy. He states that he does not feel the Viagra 25 mg will help him to maintain an erection because he had used Viagra 100 mg in the past.  Patient also complains of scrotal pain for several months. He notes that he has been seen by Urology in the past and was told that there was nothing found to explain his pain. Review of charts reveal scrotal ultrasound with hydrocele and variocele. He has tried Tramadol for scrotal pain with relief. He denies penile discharge, dysuria, scrotal swelling.   No Known Allergies Past Medical History  Diagnosis Date  . Diabetes mellitus without complication   . Hypertension    Current Outpatient Prescriptions on File Prior to Visit  Medication Sig Dispense Refill  . amLODipine (NORVASC) 10 MG tablet Take 1 tablet (10 mg total) by mouth daily. 30 tablet 0  . atenolol-chlorthalidone (TENORETIC) 100-25 MG per tablet Take 1 tablet by mouth daily. 30 tablet 0  . atorvastatin (LIPITOR) 10 MG tablet Take 1 tablet (10 mg total) by mouth daily. 30 tablet 3  . cloNIDine (CATAPRES) 0.1 MG tablet Take 1 tablet (0.1 mg total) by mouth 2 (two) times daily. 60 tablet 0  . ibuprofen (ADVIL,MOTRIN) 400 MG tablet Take 400 mg by mouth every 6 (six) hours as needed for mild pain.    Marland Kitchen insulin NPH-regular Human (NOVOLIN 70/30) (70-30) 100 UNIT/ML injection Inject 40 Units into the skin 2 (two) times daily with a meal. 10 mL 3  . lisinopril (PRINIVIL,ZESTRIL) 40 MG tablet Take 1 tablet (40 mg total) by mouth daily. 30 tablet 0  . Multiple Vitamin (MULTIVITAMIN) tablet Take 1 tablet by mouth daily.    . nicotine (NICODERM CQ - DOSED IN MG/24  HOURS) 21 mg/24hr patch Place 1 patch (21 mg total) onto the skin daily. 28 patch 0  . sildenafil (VIAGRA) 25 MG tablet Take 1 tablet (25 mg total) by mouth daily as needed for erectile dysfunction. 10 tablet 0  . traMADol (ULTRAM) 50 MG tablet Take 1 tablet (50 mg total) by mouth every 6 (six) hours as needed. 15 tablet 0  . Cyanocobalamin (B-12 PO) Take by mouth.    Marland Kitchen GINSENG PO Take by mouth.    . insulin glargine (LANTUS) 100 UNIT/ML injection Inject 0.45 mLs (45 Units total) into the skin at bedtime. (Patient not taking: Reported on 07/22/2015) 10 mL 3  . Multiple Vitamins-Minerals (ALIVE MENS ENERGY PO) Take by mouth.     No current facility-administered medications on file prior to visit.   Family History  Problem Relation Age of Onset  . Cancer Mother   . Diabetes Mother   . Hypertension Mother   . Cancer Father    Social History   Social History  . Marital Status: Married    Spouse Name: N/A  . Number of Children: N/A  . Years of Education: N/A   Occupational History  . Not on file.   Social History Main Topics  . Smoking status: Current Every Day Smoker -- 1.00 packs/day for 40 years  . Smokeless tobacco: Not on file  . Alcohol Use:  0.0 oz/week    0 Standard drinks or equivalent per week  . Drug Use: Not on file  . Sexual Activity: Not on file   Other Topics Concern  . Not on file   Social History Narrative    Review of Systems: Other than what is stated in HPI, all other systems are negative.   Objective:   Filed Vitals:   07/22/15 1440  BP: 131/95  Pulse: 78  Temp: 98 F (36.7 C)  Resp: 16    Physical Exam  Cardiovascular: Normal rate, regular rhythm and normal heart sounds.   Pulmonary/Chest: Effort normal and breath sounds normal.  Genitourinary:  Pt refused testicular exam     Lab Results  Component Value Date   WBC 7.4 10/19/2014   HGB 16.8 10/19/2014   HCT 49.2 10/19/2014   MCV 94.3 10/19/2014   PLT 291 10/19/2014   Lab Results   Component Value Date   CREATININE 1.10 07/01/2015   BUN 14 07/01/2015   NA 137 07/01/2015   K 4.1 07/01/2015   CL 95* 07/01/2015   CO2 31 07/01/2015    Lab Results  Component Value Date   HGBA1C 10.60 07/01/2015   Lipid Panel     Component Value Date/Time   CHOL 174 01/08/2015 1000   TRIG 102 01/08/2015 1000   HDL 44 01/08/2015 1000   CHOLHDL 4.0 01/08/2015 1000   VLDL 20 01/08/2015 1000   LDLCALC 110* 01/08/2015 1000       Assessment and plan:   Manuel Kramer was seen today for fatigue.  Diagnoses and all orders for this visit:  Erectile dysfunction due to diseases classified elsewhere -     Refill sildenafil (VIAGRA) 50 MG tablet; Take 1 tablet (50 mg total) by mouth daily as needed for erectile dysfunction. I have sent prescription for increased viagra dose   Low testosterone -     Begin testosterone cypionate (DEPO-TESTOSTERONE) 200 MG/ML injection; Inject 1 mL (200 mg total) into the muscle every 28 (twenty-eight) days. Will recheck after 3 months Med prescribed today but will come back for injection in 2 weeks  Scrotal pain -     Refill traMADol (ULTRAM) 50 MG tablet; Take 1 tablet (50 mg total) by mouth every 8 (eight) hours as needed. I have asked patient to make follow up with Urology for re-evaluation of scrotal pain  Diabetes type 2, uncontrolled -     Amb Referral to Nutrition and Diabetic E I have again stressed to patient the need for strict control of sugar to help with erectile dysfunction and fatigue. He has refused cbg today. He is unable to get Endocrinology appt until January. He will present with log in 2 weeks for review by pharmacist. If cbg is elevated still pharmacist will increase novolin 70-30 by a few units. He is also a possible candidate for GLP-1 therapy.   Return for Nurse Visit-testosteron inj and log review---2 weeks.        Ambrose Finland, NP-C [facility] [phone] 07/22/2015, 2:46 PM

## 2015-07-22 NOTE — Patient Instructions (Signed)
Testosterone This test is used to determine if your testosterone level is abnormal. This could be used to explain difficulty getting an erection (erectile dysfunction), inability of your partner to get pregnant (infertility), premature or delayed puberty if you are male, or the appearance of masculine physical features if you are male. PREPARATION FOR TEST A blood sample is obtained by inserting a needle into a vein in the arm. NORMAL FINDINGS  Free Testosterone: 0.3-2 pg/mL  % Free Testosterone: 0.1%-0.3% Total Testosterone:  7 mos-9 yrs (Tanner Stage I)  Male: Less than 30 ng/dL  Male: Less than 30 ng/dL  10-13 yrs (Tanner Stage II)  Male: Less than 300 ng/dL  Male: Less than 40 ng/dL  14-15 yrs (Tanner Stage III)  Male: 170-540 ng/dL  Male: Less than 60 ng/dL  16-19 yrs (Tanner Stage IV, V)  Male: 250-910 ng/dL  Male: Less than 70 ng/dL  20 yrs and over  Male: 280-1080 ng/dL  Male: Less than 70 ng/dL Ranges for normal findings may vary among different laboratories and hospitals. You should always check with your doctor after having lab work or other tests done to discuss the meaning of your test results and whether your values are considered within normal limits. MEANING OF TEST  Your caregiver will go over the test results with you and discuss the importance and meaning of your results, as well as treatment options and the need for additional tests if necessary. OBTAINING THE TEST RESULTS It is your responsibility to obtain your test results. Ask the lab or department performing the test when and how you will get your results. Document Released: 11/02/2004 Document Revised: 01/08/2012 Document Reviewed: 02/11/2014 Exit[employer] Patient Information 2015 [employer]. This information is not intended to replace advice given to you by your health care Candie Gintz. Make sure you discuss any questions you have with your health care Marcellius Montagna.  

## 2015-07-23 ENCOUNTER — Other Ambulatory Visit: Payer: Self-pay | Admitting: *Deleted

## 2015-07-23 DIAGNOSIS — I10 Essential (primary) hypertension: Secondary | ICD-10-CM

## 2015-07-23 DIAGNOSIS — E139 Other specified diabetes mellitus without complications: Secondary | ICD-10-CM

## 2015-07-23 MED ORDER — LISINOPRIL 40 MG PO TABS: 40.0000 mg | ORAL_TABLET | Freq: Every day | ORAL | Status: DC

## 2015-07-23 MED ORDER — ATENOLOL-CHLORTHALIDONE 100-25 MG PO TABS: 1.0000 | ORAL_TABLET | Freq: Every day | ORAL | Status: DC

## 2015-07-23 MED ORDER — INSULIN NPH ISOPHANE & REGULAR (70-30) 100 UNIT/ML ~~LOC~~ SUSP: 40.0000 [IU] | Freq: Two times a day (BID) | SUBCUTANEOUS | Status: DC

## 2015-07-29 ENCOUNTER — Telehealth: Payer: Self-pay | Admitting: Internal Medicine

## 2015-07-29 NOTE — Telephone Encounter (Signed)
Patient called and requested to speak to nurse, please f/u with pt.

## 2015-07-30 ENCOUNTER — Other Ambulatory Visit: Payer: Self-pay

## 2015-07-30 ENCOUNTER — Ambulatory Visit: Payer: Medicaid Other

## 2015-07-30 ENCOUNTER — Ambulatory Visit: Payer: Medicaid Other | Attending: Internal Medicine

## 2015-07-30 VITALS — BP 130/85 | HR 70 | Temp 98.4°F | Resp 18 | Ht 65.0 in | Wt 147.0 lb

## 2015-07-30 DIAGNOSIS — R7989 Other specified abnormal findings of blood chemistry: Secondary | ICD-10-CM

## 2015-07-30 DIAGNOSIS — E291 Testicular hypofunction: Secondary | ICD-10-CM

## 2015-07-30 MED ORDER — TESTOSTERONE CYPIONATE 200 MG/ML IM SOLN
200.0000 mg | Freq: Once | INTRAMUSCULAR | Status: AC
  Administered 2015-07-30: 200 mg via INTRAMUSCULAR

## 2015-07-30 NOTE — Telephone Encounter (Signed)
Nurse called patient, left message with wife for patient to return call to [facility] with [facility] at [phone]. Nurse called patient to make him aware of refills on testosterone. Patient does not need to come into clinic to pick up prescription, refills are at pharmacy.

## 2015-07-30 NOTE — Progress Notes (Signed)
Patient here for testosterone injection. First injection of three. Patient feels good today.

## 2015-08-02 ENCOUNTER — Ambulatory Visit: Payer: Medicaid Other

## 2015-08-09 ENCOUNTER — Other Ambulatory Visit: Payer: Self-pay

## 2015-08-09 DIAGNOSIS — I10 Essential (primary) hypertension: Secondary | ICD-10-CM

## 2015-08-09 MED ORDER — CLONIDINE HCL 0.1 MG PO TABS: 0.1000 mg | ORAL_TABLET | Freq: Two times a day (BID) | ORAL | Status: DC

## 2015-08-09 NOTE — Telephone Encounter (Signed)
Refilled clonidine for 5 months per Mahlon Gammon request. Lisinopril and tenoretic were filled on 07/23/15.

## 2015-09-03 ENCOUNTER — Ambulatory Visit: Payer: Medicaid Other

## 2015-09-06 ENCOUNTER — Other Ambulatory Visit: Payer: Self-pay | Admitting: Internal Medicine

## 2015-09-08 ENCOUNTER — Other Ambulatory Visit: Payer: Self-pay | Admitting: Internal Medicine

## 2015-09-08 ENCOUNTER — Other Ambulatory Visit: Payer: Self-pay

## 2015-09-08 DIAGNOSIS — I10 Essential (primary) hypertension: Secondary | ICD-10-CM

## 2015-09-08 MED ORDER — ATORVASTATIN CALCIUM 10 MG PO TABS: 10.0000 mg | ORAL_TABLET | Freq: Every day | ORAL | Status: DC

## 2015-09-08 MED ORDER — AMLODIPINE BESYLATE 10 MG PO TABS: 10.0000 mg | ORAL_TABLET | Freq: Every day | ORAL | Status: DC

## 2015-09-10 ENCOUNTER — Ambulatory Visit: Payer: Medicaid Other

## 2015-09-13 ENCOUNTER — Ambulatory Visit: Payer: Medicaid Other

## 2015-09-20 ENCOUNTER — Other Ambulatory Visit: Payer: Self-pay

## 2015-09-20 DIAGNOSIS — R7989 Other specified abnormal findings of blood chemistry: Secondary | ICD-10-CM

## 2015-09-22 ENCOUNTER — Telehealth: Payer: Self-pay

## 2015-09-22 ENCOUNTER — Ambulatory Visit: Payer: Medicaid Other | Attending: Internal Medicine

## 2015-09-22 VITALS — BP 145/88 | HR 82 | Temp 98.9°F | Resp 18 | Ht 62.0 in | Wt 146.6 lb

## 2015-09-22 DIAGNOSIS — E291 Testicular hypofunction: Secondary | ICD-10-CM

## 2015-09-22 DIAGNOSIS — N5082 Scrotal pain: Secondary | ICD-10-CM | POA: Diagnosis present

## 2015-09-22 DIAGNOSIS — R7989 Other specified abnormal findings of blood chemistry: Secondary | ICD-10-CM

## 2015-09-22 DIAGNOSIS — I10 Essential (primary) hypertension: Secondary | ICD-10-CM

## 2015-09-22 MED ORDER — TESTOSTERONE CYPIONATE 200 MG/ML IM SOLN: 200.0000 mg | INTRAMUSCULAR | Status: DC

## 2015-09-22 MED ORDER — TESTOSTERONE CYPIONATE 200 MG/ML IM SOLN
200.0000 mg | Freq: Once | INTRAMUSCULAR | Status: AC
  Administered 2015-09-22: 200 mg via INTRAMUSCULAR

## 2015-09-22 MED ORDER — TRAMADOL HCL 50 MG PO TABS: 50.0000 mg | ORAL_TABLET | Freq: Three times a day (TID) | ORAL | Status: DC | PRN

## 2015-09-22 NOTE — Telephone Encounter (Signed)
Patient came to clinic this morning for testosterone injection and forgot medication. Nurse called patient to make sure he is coming back today for his injection. Patient is waiting for a ride and will be here before 4:30pm today.

## 2015-09-22 NOTE — Progress Notes (Signed)
Patient does not feel good today.  Provider referred to [facility], patient had ultrasound on testicles. Patient is up to 2 tramadol, and drank liquor trying to pass out due to pain in scrotum.  Per patient, provider is sending him back to [facility]. Walking alleviates pain.  Patient currently has no pain, "I just took 2 advil, it works for 1 or 2 hours but I can't take that all day". Tramadol is not working for pain.  Patient did not have pain the first 2-3 days after first testosterone injection.  Provider aware of above information, do not drink alcohol with pain medications, continue with testosterone injection, make appointment with [facility], and nurse will find out if patient has heard from endocrinology.  Patient agrees to take medication as prescribed, without alcohol.

## 2015-09-22 NOTE — Patient Instructions (Addendum)
Please return in 28 days for your 3rd testosterone injection, nurse only visit.

## 2015-10-06 ENCOUNTER — Other Ambulatory Visit: Payer: Self-pay | Admitting: Internal Medicine

## 2015-10-12 ENCOUNTER — Other Ambulatory Visit: Payer: Self-pay | Admitting: Internal Medicine

## 2015-10-20 ENCOUNTER — Ambulatory Visit: Payer: Medicaid Other

## 2015-10-25 ENCOUNTER — Other Ambulatory Visit: Payer: Self-pay | Admitting: Internal Medicine

## 2015-12-08 ENCOUNTER — Other Ambulatory Visit: Payer: Self-pay | Admitting: *Deleted

## 2015-12-08 ENCOUNTER — Other Ambulatory Visit: Payer: Self-pay | Admitting: Internal Medicine

## 2015-12-08 DIAGNOSIS — I10 Essential (primary) hypertension: Secondary | ICD-10-CM

## 2015-12-08 MED ORDER — AMLODIPINE BESYLATE 10 MG PO TABS: 10.0000 mg | ORAL_TABLET | Freq: Every day | ORAL | Status: DC

## 2015-12-17 ENCOUNTER — Telehealth: Payer: Self-pay | Admitting: *Deleted

## 2015-12-17 ENCOUNTER — Ambulatory Visit: Payer: Medicaid Other | Admitting: Internal Medicine

## 2015-12-17 NOTE — Telephone Encounter (Signed)
Patient verified DOB Medical Assistant left message on patient's home and cell voicemail. Voicemail states to give a call back to [place] with [facility] at [phone].   !!!!Please schedule patient an appointment with Manuel Kramer for this Monday or Friday coming!!!

## 2015-12-31 ENCOUNTER — Other Ambulatory Visit: Payer: Self-pay | Admitting: Internal Medicine

## 2016-01-11 ENCOUNTER — Other Ambulatory Visit: Payer: Self-pay | Admitting: Internal Medicine

## 2016-01-20 ENCOUNTER — Other Ambulatory Visit: Payer: Self-pay | Admitting: Internal Medicine

## 2016-02-04 ENCOUNTER — Other Ambulatory Visit: Payer: Self-pay | Admitting: Internal Medicine

## 2016-02-07 ENCOUNTER — Other Ambulatory Visit: Payer: Self-pay | Admitting: Internal Medicine

## 2016-04-03 ENCOUNTER — Other Ambulatory Visit: Payer: Self-pay | Admitting: Internal Medicine

## 2016-04-11 ENCOUNTER — Other Ambulatory Visit: Payer: Self-pay | Admitting: Internal Medicine

## 2016-04-17 ENCOUNTER — Other Ambulatory Visit: Payer: Self-pay | Admitting: Internal Medicine

## 2016-04-18 ENCOUNTER — Telehealth: Payer: Self-pay | Admitting: Internal Medicine

## 2016-04-18 MED ORDER — ATENOLOL-CHLORTHALIDONE 100-25 MG PO TABS: 1.0000 | ORAL_TABLET | Freq: Every day | ORAL | Status: DC

## 2016-04-18 MED ORDER — AMLODIPINE BESYLATE 10 MG PO TABS: 10.0000 mg | ORAL_TABLET | Freq: Every day | ORAL | Status: DC

## 2016-04-18 MED ORDER — LISINOPRIL 40 MG PO TABS: 40.0000 mg | ORAL_TABLET | Freq: Every day | ORAL | Status: DC

## 2016-04-18 MED ORDER — ATORVASTATIN CALCIUM 10 MG PO TABS: 10.0000 mg | ORAL_TABLET | Freq: Every day | ORAL | Status: DC

## 2016-04-18 MED ORDER — CLONIDINE HCL 0.1 MG PO TABS: 0.1000 mg | ORAL_TABLET | Freq: Two times a day (BID) | ORAL | Status: DC

## 2016-04-18 NOTE — Telephone Encounter (Signed)
Medications were refilled - he must have an office visit for any further refills.

## 2016-04-18 NOTE — Telephone Encounter (Signed)
Medication Refill: all medications. Pt did not state names   Pt has been informed that he needs an office visit for medication refills and will call back Monday June 26th so we can schedule him an appointment for July Pt would like a call letting him know if his medications can be refilled before his appointment or not  Please assist. Thank you .

## 2016-04-27 ENCOUNTER — Other Ambulatory Visit: Payer: Self-pay | Admitting: Internal Medicine

## 2016-05-08 ENCOUNTER — Ambulatory Visit: Payer: Medicaid Other | Admitting: Family Medicine

## 2016-05-16 ENCOUNTER — Other Ambulatory Visit: Payer: Self-pay | Admitting: Pharmacist

## 2016-05-16 MED ORDER — AMLODIPINE BESYLATE 10 MG PO TABS: 10.0000 mg | ORAL_TABLET | Freq: Every day | ORAL | Status: DC

## 2016-05-16 MED ORDER — ATENOLOL-CHLORTHALIDONE 100-25 MG PO TABS: 1.0000 | ORAL_TABLET | Freq: Every day | ORAL | Status: DC

## 2016-05-17 ENCOUNTER — Other Ambulatory Visit: Payer: Self-pay | Admitting: Pharmacist

## 2016-05-17 MED ORDER — ATORVASTATIN CALCIUM 10 MG PO TABS: 10.0000 mg | ORAL_TABLET | Freq: Every day | ORAL | Status: DC

## 2016-05-17 MED ORDER — LISINOPRIL 40 MG PO TABS: 40.0000 mg | ORAL_TABLET | Freq: Every day | ORAL | Status: DC

## 2016-05-17 MED ORDER — CLONIDINE HCL 0.1 MG PO TABS: 0.1000 mg | ORAL_TABLET | Freq: Two times a day (BID) | ORAL | Status: DC

## 2016-05-17 MED ORDER — INSULIN NPH ISOPHANE & REGULAR (70-30) 100 UNIT/ML ~~LOC~~ SUSP: SUBCUTANEOUS | Status: DC

## 2016-05-22 ENCOUNTER — Ambulatory Visit: Payer: Medicaid Other | Attending: Family Medicine | Admitting: Family Medicine

## 2016-05-22 ENCOUNTER — Encounter: Payer: Self-pay | Admitting: Family Medicine

## 2016-05-22 VITALS — BP 145/90 | HR 76 | Temp 98.4°F | Ht 62.0 in | Wt 136.2 lb

## 2016-05-22 DIAGNOSIS — E1169 Type 2 diabetes mellitus with other specified complication: Secondary | ICD-10-CM | POA: Diagnosis not present

## 2016-05-22 DIAGNOSIS — Z79899 Other long term (current) drug therapy: Secondary | ICD-10-CM | POA: Insufficient documentation

## 2016-05-22 DIAGNOSIS — E1140 Type 2 diabetes mellitus with diabetic neuropathy, unspecified: Secondary | ICD-10-CM | POA: Insufficient documentation

## 2016-05-22 DIAGNOSIS — E139 Other specified diabetes mellitus without complications: Secondary | ICD-10-CM | POA: Diagnosis not present

## 2016-05-22 DIAGNOSIS — E785 Hyperlipidemia, unspecified: Secondary | ICD-10-CM

## 2016-05-22 DIAGNOSIS — I10 Essential (primary) hypertension: Secondary | ICD-10-CM

## 2016-05-22 DIAGNOSIS — Z794 Long term (current) use of insulin: Secondary | ICD-10-CM | POA: Diagnosis not present

## 2016-05-22 DIAGNOSIS — R634 Abnormal weight loss: Secondary | ICD-10-CM

## 2016-05-22 DIAGNOSIS — E1149 Type 2 diabetes mellitus with other diabetic neurological complication: Secondary | ICD-10-CM | POA: Diagnosis not present

## 2016-05-22 LAB — POCT GLYCOSYLATED HEMOGLOBIN (HGB A1C): Hemoglobin A1C: 9.8

## 2016-05-22 LAB — GLUCOSE, POCT (MANUAL RESULT ENTRY): POC GLUCOSE: 293 mg/dL — AB (ref 70–99)

## 2016-05-22 MED ORDER — ATENOLOL-CHLORTHALIDONE 100-25 MG PO TABS: 1.0000 | ORAL_TABLET | Freq: Every day | ORAL | 3 refills | Status: DC

## 2016-05-22 MED ORDER — NIFEDIPINE ER OSMOTIC RELEASE 60 MG PO TB24: 60.0000 mg | ORAL_TABLET | Freq: Every day | ORAL | 3 refills | Status: DC

## 2016-05-22 MED ORDER — LISINOPRIL 40 MG PO TABS: 40.0000 mg | ORAL_TABLET | Freq: Every day | ORAL | 3 refills | Status: DC

## 2016-05-22 MED ORDER — GABAPENTIN 300 MG PO CAPS: 300.0000 mg | ORAL_CAPSULE | Freq: Three times a day (TID) | ORAL | 3 refills | Status: DC

## 2016-05-22 MED ORDER — ATORVASTATIN CALCIUM 10 MG PO TABS: 10.0000 mg | ORAL_TABLET | Freq: Every day | ORAL | 3 refills | Status: DC

## 2016-05-22 MED ORDER — INSULIN NPH ISOPHANE & REGULAR (70-30) 100 UNIT/ML ~~LOC~~ SUSP: SUBCUTANEOUS | 3 refills | Status: DC

## 2016-05-22 NOTE — Patient Instructions (Signed)
Diabetes Mellitus and Food It is important for you to manage your blood sugar (glucose) level. Your blood glucose level can be greatly affected by what you eat. Eating healthier foods in the appropriate amounts throughout the day at about the same time each day will help you control your blood glucose level. It can also help slow or prevent worsening of your diabetes mellitus. Healthy eating may even help you improve the level of your blood pressure and reach or maintain a healthy weight.  General recommendations for healthful eating and cooking habits include:  Eating meals and snacks regularly. Avoid going long periods of time without eating to lose weight.  Eating a diet that consists mainly of plant-based foods, such as fruits, vegetables, nuts, legumes, and whole grains.  Using low-heat cooking methods, such as baking, instead of high-heat cooking methods, such as deep frying. Work with your dietitian to make sure you understand how to use the Nutrition Facts information on food labels. HOW CAN FOOD AFFECT ME? Carbohydrates Carbohydrates affect your blood glucose level more than any other type of food. Your dietitian will help you determine how many carbohydrates to eat at each meal and teach you how to count carbohydrates. Counting carbohydrates is important to keep your blood glucose at a healthy level, especially if you are using insulin or taking certain medicines for diabetes mellitus. Alcohol Alcohol can cause sudden decreases in blood glucose (hypoglycemia), especially if you use insulin or take certain medicines for diabetes mellitus. Hypoglycemia can be a life-threatening condition. Symptoms of hypoglycemia (sleepiness, dizziness, and disorientation) are similar to symptoms of having too much alcohol.  If your health care provider has given you approval to drink alcohol, do so in moderation and use the following guidelines:  Women should not have more than one drink per day, and men  should not have more than two drinks per day. One drink is equal to:  12 oz of beer.  5 oz of wine.  1 oz of hard liquor.  Do not drink on an empty stomach.  Keep yourself hydrated. Have water, diet soda, or unsweetened iced tea.  Regular soda, juice, and other mixers might contain a lot of carbohydrates and should be counted. WHAT FOODS ARE NOT RECOMMENDED? As you make food choices, it is important to remember that all foods are not the same. Some foods have fewer nutrients per serving than other foods, even though they might have the same number of calories or carbohydrates. It is difficult to get your body what it needs when you eat foods with fewer nutrients. Examples of foods that you should avoid that are high in calories and carbohydrates but low in nutrients include:  Trans fats (most processed foods list trans fats on the Nutrition Facts label).  Regular soda.  Juice.  Candy.  Sweets, such as cake, pie, doughnuts, and cookies.  Fried foods. WHAT FOODS CAN I EAT? Eat nutrient-rich foods, which will nourish your body and keep you healthy. The food you should eat also will depend on several factors, including:  The calories you need.  The medicines you take.  Your weight.  Your blood glucose level.  Your blood pressure level.  Your cholesterol level. You should eat a variety of foods, including:  Protein.  Lean cuts of meat.  Proteins low in saturated fats, such as fish, egg whites, and beans. Avoid processed meats.  Fruits and vegetables.  Fruits and vegetables that may help control blood glucose levels, such as apples, mangoes, and   yams.  Dairy products.  Choose fat-free or low-fat dairy products, such as milk, yogurt, and cheese.  Grains, bread, pasta, and rice.  Choose whole grain products, such as multigrain bread, whole oats, and Hernandez rice. These foods may help control blood pressure.  Fats.  Foods containing healthful fats, such as nuts,  avocado, olive oil, canola oil, and fish. DOES EVERYONE WITH DIABETES MELLITUS HAVE THE SAME MEAL PLAN? Because every person with diabetes mellitus is different, there is not one meal plan that works for everyone. It is very important that you meet with a dietitian who will help you create a meal plan that is just right for you.   This information is not intended to replace advice given to you by your health care provider. Make sure you discuss any questions you have with your health care provider.   Document Released: 07/13/2005 Document Revised: 11/06/2014 Document Reviewed: 09/12/2013 Elsevier Interactive Patient Education 2016 [employer]  

## 2016-05-22 NOTE — Progress Notes (Signed)
Subjective:  Patient ID: Manuel Kramer, male    DOB: [birth year]  Age: 60 y.o. MRN: [id number]  CC: Diabetes; Hypertension; and Congestive Heart Failure   HPI Manuel Kramer is a 60 year old male with a history of type 2 diabetes mellitus (A1c 9.8 from today), hypertension, hyperlipidemia who presents today to establish care with me as he was previously followed by nurse practitioner who is no longer with the practice.  He was recently seen by urology due to persisting genital pain and was informed that this was secondary to diabetic neuropathy; he was placed on meloxicam which helps for 2 weeks and after that symptoms returned. He endorses numbness in his feet which is worse at night.  With regards to his diabetes he has not been compliant with a diabetic diet or exercise and reports fasting sugars in the 147s and random between 200 and 300. He has been taking 45 units of NovoLog 70/30 twice daily.  His blood pressure is slightly elevated and he admits to just taking his antihypertensives in the examining room. He is concerned about a 17 pound weight loss since his last visit 10 months ago and also complains of lethargy and increased somnolence.   Past Medical History:  Diagnosis Date  . Diabetes mellitus without complication (HCC)   . Hypertension     Past Surgical History:  Procedure Laterality Date  . COLOSCOPY      POLYP     Outpatient Medications Prior to Visit  Medication Sig Dispense Refill  . atenolol-chlorthalidone (TENORETIC) 100-25 MG tablet Take 1 tablet by mouth daily. Must have office visit for refills 30 tablet 0  . atorvastatin (LIPITOR) 10 MG tablet Take 1 tablet (10 mg total) by mouth daily. Must have office visit for refills 30 tablet 0  . ibuprofen (ADVIL,MOTRIN) 400 MG tablet Take 400 mg by mouth every 6 (six) hours as needed for mild pain.    Marland Kitchen insulin NPH-regular Human (NOVOLIN 70/30) (70-30) 100 UNIT/ML injection Inject 40 units subcutaneously twice  daily with a meal (1000/80=13). Must have office visit for more refills 10 mL 0  . lisinopril (PRINIVIL,ZESTRIL) 40 MG tablet Take 1 tablet (40 mg total) by mouth daily. Must have office visit for refills 30 tablet 0  . amLODipine (NORVASC) 10 MG tablet Take 1 tablet (10 mg total) by mouth daily. Must have office visit for refills 30 tablet 0  . cloNIDine (CATAPRES) 0.1 MG tablet Take 1 tablet (0.1 mg total) by mouth 2 (two) times daily. Must have office visit for refills 60 tablet 0  . Cyanocobalamin (B-12 PO) Take by mouth.    Marland Kitchen GINSENG PO Take by mouth.    . Multiple Vitamin (MULTIVITAMIN) tablet Take 1 tablet by mouth daily.    . Multiple Vitamins-Minerals (ALIVE MENS ENERGY PO) Take by mouth.    . nicotine (NICODERM CQ - DOSED IN MG/24 HOURS) 21 mg/24hr patch Place 1 patch (21 mg total) onto the skin daily. (Patient not taking: Reported on 05/22/2016) 28 patch 0  . traMADol (ULTRAM) 50 MG tablet Take 1 tablet (50 mg total) by mouth every 8 (eight) hours as needed. (Patient not taking: Reported on 05/22/2016) 60 tablet 1  . sildenafil (REVATIO) 20 MG tablet Take 3 tablet by mouth daily as needed for erectile dysfunction. (Patient not taking: Reported on 05/22/2016) 30 tablet 1  . testosterone cypionate (DEPOTESTOSTERONE CYPIONATE) 200 MG/ML injection Inject 1 mL (200 mg total) into the muscle every 28 (twenty-eight) days. (Patient not taking:  Reported on 05/22/2016) 1 mL 0   No facility-administered medications prior to visit.     ROS Review of Systems  Constitutional: Positive for fatigue and unexpected weight change. Negative for activity change and appetite change.  HENT: Negative for sinus pressure and sore throat.   Eyes: Negative for visual disturbance.  Respiratory: Negative for cough, chest tightness and shortness of breath.   Cardiovascular: Negative for chest pain and leg swelling.  Gastrointestinal: Negative for abdominal distention, abdominal pain, constipation and diarrhea.    Endocrine: Negative.   Genitourinary: Negative for dysuria.       Genital pain  Musculoskeletal: Negative for joint swelling and myalgias.  Skin: Negative for rash.  Allergic/Immunologic: Negative.   Neurological: Positive for numbness. Negative for weakness and light-headedness.  Psychiatric/Behavioral: Positive for sleep disturbance. Negative for dysphoric mood and suicidal ideas.    Objective:  BP (!) 145/90 (BP Location: Right Arm, Patient Position: Sitting, Cuff Size: Small)   Pulse 76   Temp 98.4 F (36.9 C) (Oral)   Ht 5\' 2"  (1.575 m)   Wt 136 lb 3.2 oz (61.8 kg)   SpO2 99%   BMI 24.91 kg/m   BP/Weight 05/22/2016 09/22/2015 07/30/2015  Systolic BP 145 145 130  Diastolic BP 90 88 85  Wt. (Lbs) 136.2 146.6 147  BMI 24.91 26.81 24.46      Physical Exam  Constitutional: He is oriented to person, place, and time. He appears well-developed and well-nourished.  Cardiovascular: Normal rate, normal heart sounds and intact distal pulses.   No murmur heard. Pulmonary/Chest: Effort normal and breath sounds normal. He has no wheezes. He has no rales. He exhibits no tenderness.  Abdominal: Soft. Bowel sounds are normal. He exhibits no distension and no mass. There is no tenderness.  Musculoskeletal: Normal range of motion.  Neurological: He is alert and oriented to person, place, and time.  Skin: Skin is warm and dry.  Psychiatric: He has a normal mood and affect.     Lab Results  Component Value Date   HGBA1C 9.8 05/22/2016    CMP Latest Ref Rng & Units 07/01/2015 01/08/2015 10/25/2007  Glucose 65 - 99 mg/dL 161(W) 960(A) 540(J)  BUN 7 - 25 mg/dL 14 15 20   Creatinine 0.70 - 1.33 mg/dL 8.11 9.14 7.82(N)  Sodium 135 - 146 mmol/L 137 136 135  Potassium 3.5 - 5.3 mmol/L 4.1 4.1 3.7  Chloride 98 - 110 mmol/L 95(L) 95(L) 99  CO2 20 - 31 mmol/L 31 28 32  Calcium 8.6 - 10.3 mg/dL 9.5 56.2 8.0(L)  Total Protein 6.1 - 8.1 g/dL 6.6 7.3 -  Total Bilirubin 0.2 - 1.2 mg/dL 0.5 0.4  -  Alkaline Phos 40 - 115 U/L 66 65 -  AST 10 - 35 U/L 26 18 -  ALT 9 - 46 U/L 47(H) 31 -    Lipid Panel     Component Value Date/Time   CHOL 174 01/08/2015 1000   TRIG 102 01/08/2015 1000   HDL 44 01/08/2015 1000   CHOLHDL 4.0 01/08/2015 1000   VLDL 20 01/08/2015 1000   LDLCALC 110 (H) 01/08/2015 1000     Assessment & Plan:   1. Other specified diabetes mellitus without complications (HCC) Uncontrolled with A1c of 9.8 due to poor compliance with diet He is resisting increase in dose of his NovoLog 70/30 and would like to work on his lifestyle and diet. I will see him back in one month to review his blood sugar log and if this  seems to be no improvement I will either increase his dose of NovoLog 70/30 for short acting NovoLog and he agrees with this. Clinical pharmacist called in for diabetic teaching. He is insistent on referral to nutritionist which I have done. Advised to schedule annual eye exam - Glucose (CBG) - Hemoglobin A1c - HgB A1c  2. Essential hypertension Uncontrolled due to the fact that he just took his antihypertensives in the clinic. Discontinue clonidine due to somnolence We'll substitute amlodipine with Nifedipine Low-sodium diet - atenolol-chlorthalidone (TENORETIC) 100-25 MG tablet; Take 1 tablet by mouth daily. Must have office visit for refills  Dispense: 30 tablet; Refill: 0 - lisinopril (PRINIVIL,ZESTRIL) 40 MG tablet; Take 1 tablet (40 mg total) by mouth daily. Must have office visit for refills  Dispense: 30 tablet; Refill: 0  3. Hyperlipidemia Advised on low-cholesterol diet - atorvastatin (LIPITOR) 10 MG tablet; Take 1 tablet (10 mg total) by mouth daily. Must have office visit for refills  Dispense: 30 tablet; Refill: 0  4. Other diabetic neurological complication associated with type 2 diabetes mellitus (HCC) Commenced on gabapentin He was also informed by urology that the source of his genital pain is from his diabetic neuropathy.  5.  Loss of weight Could be secondary to catabolic effects of uncontrolled diabetes mellitus TSH sent off    No orders of the defined types were placed in this encounter.   Follow-up: Return in about 1 month (around 06/22/2016) for Follow-up on diabetes mellitus.   Jaclyn Shaggy MD

## 2016-05-22 NOTE — Progress Notes (Signed)
Medication refills Weight loss Pain in genital pain

## 2016-06-08 ENCOUNTER — Encounter: Payer: Self-pay | Admitting: Dietician

## 2016-06-08 ENCOUNTER — Encounter: Payer: Medicaid Other | Attending: Family Medicine | Admitting: Dietician

## 2016-06-08 DIAGNOSIS — Z713 Dietary counseling and surveillance: Secondary | ICD-10-CM | POA: Diagnosis present

## 2016-06-08 DIAGNOSIS — I10 Essential (primary) hypertension: Secondary | ICD-10-CM

## 2016-06-08 DIAGNOSIS — E139 Other specified diabetes mellitus without complications: Secondary | ICD-10-CM | POA: Insufficient documentation

## 2016-06-08 DIAGNOSIS — E785 Hyperlipidemia, unspecified: Secondary | ICD-10-CM

## 2016-06-08 NOTE — Progress Notes (Signed)
Diabetes Self-Management Education  Visit Type: First/Initial  Appt. Start Time: 1500 Appt. End Time: 1630  06/08/2016  [name], identified by name and date of birth, is a 60 y.o. male with a diagnosis of Diabetes: Type 2.  He is here today with his son who lives out of state.  Patient is taking Novolin 70/30 at his meals but will sometimes skip if he is eating little to no carbohydrates as he has been recently.  He reports experiencing hypoglycemia regularly and is treating with fruit juice or peppermint hard candies.  He has lost a significant amount of weight unintentionally over the past month or so and even more so in the past two weeks.  He has been following a low-carb vegetarian diet for the last 2 weeks and states that his overall intake has been greatly reduced from what it was.  Son reports before he was eating large portions of meats like fried chicken and starches like mac n' cheese.  His family members have been a big influence on his decision to follow the new diet changes and we discussed today an eating plan that will help manage his blood sugar, hopefully reduce his hypoglycemia, and help stabilize his weight.  Discussed importance of consistent carbohydrate intake for reducing low blood sugars.  He reports losing his glucometer during his recent move and is not currently checking blood sugars at home.  Recommended he speak with his PCP about getting a new glucometer with prescribed testing supplies or buy an over-the-counter brand such as [employer].  ASSESSMENT  Height 5\' 2"  (1.575 m), weight 129 lb 4.8 oz (58.7 kg). Body mass index is 23.65 kg/m.      Diabetes Self-Management Education - 06/08/16 1948      Visit Information   Visit Type First/Initial     Initial Visit   Diabetes Type Type 2   Are you taking your medications as prescribed? No   Date Diagnosed 2001     Health Coping   How would you rate your overall health? Very Poor     Psychosocial Assessment    Patient Belief/Attitude about Diabetes Motivated to manage diabetes   Other persons present Patient;Family Member   Patient Concerns Nutrition/Meal planning;Weight Control  has had recent unintentional weight loss   Learning Readiness Ready   How often do you need to have someone help you when you read instructions, pamphlets, or other written materials from your doctor or pharmacy? 1 - Never   What is the last grade level you completed in school? BS Degree     Complications   Last HgB A1C per patient/outside source 9 %   How often do you check your blood sugar? 0 times/day (not testing)  lost glucometer when moving   Number of hypoglycemic episodes per month 4   Have you had a dilated eye exam in the past 12 months? No   Have you had a dental exam in the past 12 months? No   Are you checking your feet? No     Dietary Intake   Breakfast hummus and pita chips with diet soda   Lunch salad   Dinner eggplant parmesan   Beverage(s) diet sodas, flavored water     Exercise   Exercise Type ADL's   How many days per week to you exercise? 0   How many minutes per day do you exercise? 0   Total minutes per week of exercise 0     Patient Education  Previous Diabetes Education Yes (please comment)  2015   Nutrition management  Role of diet in the treatment of diabetes and the relationship between the three main macronutrients and blood glucose level;Food label reading, portion sizes and measuring food.;Carbohydrate counting   Medications Reviewed patients medication for diabetes, action, purpose, timing of dose and side effects.   Acute complications Taught treatment of hypoglycemia - the 15 rule.   Chronic complications Relationship between chronic complications and blood glucose control;Reviewed with patient heart disease, higher risk of, and prevention   Psychosocial adjustment Identified and addressed patients feelings and concerns about diabetes   Personal strategies to promote  health Review risk of smoking and offered smoking cessation     Individualized Goals (developed by patient)   Nutrition Follow meal plan discussed;General guidelines for healthy choices and portions discussed   Medications take my medication as prescribed   Monitoring  Other (comment)  discuss getting new glucometer with PCP   Reducing Risk stop smoking;treat hypoglycemia with 15 grams of carbs if blood glucose less than /dL     Outcomes   Expected Outcomes Demonstrated interest in learning. Expect positive outcomes   Future DMSE 4-6 wks   Program Status Not Completed      Individualized Plan for Diabetes Self-Management Training:   Learning Objective:  Patient will have a greater understanding of diabetes self-management. Patient education plan is to attend individual and/or group sessions per assessed needs and concerns.   Plan:   Follow Diabetes Meal Plan as instructed  Eat 3 meals and snacks as needed, every 3-5 hrs  Aim for carbohydrate intake of 45-60 grams carbohydrate/meal  Limit carbohydrate intake to 15-30 grams carbohydrate/snack  Add lean protein foods to meals/snacks  Use the Diabetes Portion Plate to create healthy, balanced meals  Monitor glucose levels as instructed by your doctor -- get another glucometer before next visit  Expected Outcomes:  Demonstrated interest in learning. Expect positive outcomes  Education material provided: Meal plan card, My Plate and Snack sheet  If problems or questions, patient to contact team via:  Phone and Email  Future DSME appointment: 4-6 wks

## 2016-06-19 ENCOUNTER — Telehealth: Payer: Self-pay | Admitting: Family Medicine

## 2016-06-19 MED ORDER — GLUCOSE BLOOD VI STRP: ORAL_STRIP | 12 refills | Status: DC

## 2016-06-19 MED ORDER — ACCU-CHEK SOFTCLIX LANCET DEV MISC: 0 refills | Status: DC

## 2016-06-19 MED ORDER — ACCU-CHEK SOFTCLIX LANCETS MISC: 12 refills | Status: DC

## 2016-06-19 MED ORDER — ACCU-CHEK AVIVA PLUS W/DEVICE KIT: PACK | 0 refills | Status: DC

## 2016-06-19 NOTE — Telephone Encounter (Signed)
Patient needs a glucometer. Please follow up.

## 2016-06-19 NOTE — Telephone Encounter (Signed)
Accu-chek Aviva glucometer and supplies ordered.

## 2016-06-22 ENCOUNTER — Ambulatory Visit: Payer: Medicaid Other | Admitting: Family Medicine

## 2016-07-06 ENCOUNTER — Ambulatory Visit: Payer: Medicaid Other | Admitting: Dietician

## 2016-07-20 ENCOUNTER — Ambulatory Visit: Payer: Medicaid Other | Admitting: Dietician

## 2016-07-21 ENCOUNTER — Ambulatory Visit: Payer: Medicaid Other | Admitting: Family Medicine

## 2016-08-21 ENCOUNTER — Encounter (HOSPITAL_COMMUNITY): Payer: Self-pay

## 2016-08-21 ENCOUNTER — Emergency Department (HOSPITAL_COMMUNITY)
Admission: EM | Admit: 2016-08-21 | Discharge: 2016-08-22 | Disposition: A | Discharge status: Home / Self Care | Payer: Medicaid Other | Attending: Emergency Medicine | Admitting: Emergency Medicine

## 2016-08-21 DIAGNOSIS — E860 Dehydration: Secondary | ICD-10-CM

## 2016-08-21 DIAGNOSIS — E1140 Type 2 diabetes mellitus with diabetic neuropathy, unspecified: Secondary | ICD-10-CM | POA: Diagnosis not present

## 2016-08-21 DIAGNOSIS — I10 Essential (primary) hypertension: Secondary | ICD-10-CM | POA: Diagnosis not present

## 2016-08-21 DIAGNOSIS — E1165 Type 2 diabetes mellitus with hyperglycemia: Secondary | ICD-10-CM | POA: Insufficient documentation

## 2016-08-21 DIAGNOSIS — Z794 Long term (current) use of insulin: Secondary | ICD-10-CM | POA: Insufficient documentation

## 2016-08-21 DIAGNOSIS — F17210 Nicotine dependence, cigarettes, uncomplicated: Secondary | ICD-10-CM | POA: Diagnosis not present

## 2016-08-21 DIAGNOSIS — Z79899 Other long term (current) drug therapy: Secondary | ICD-10-CM | POA: Diagnosis not present

## 2016-08-21 DIAGNOSIS — R739 Hyperglycemia, unspecified: Secondary | ICD-10-CM

## 2016-08-21 LAB — URINE MICROSCOPIC-ADD ON

## 2016-08-21 LAB — URINALYSIS, ROUTINE W REFLEX MICROSCOPIC
BILIRUBIN URINE: NEGATIVE
Glucose, UA: >1000 mg/dL — AB
HGB URINE DIPSTICK: NEGATIVE
KETONES UR: 15 mg/dL — AB
Leukocytes, UA: NEGATIVE
NITRITE: NEGATIVE
PH: 5.5 (ref 5.0–8.0)
Protein, ur: NEGATIVE mg/dL
SPECIFIC GRAVITY, URINE: 1.039 — AB (ref 1.005–1.030)

## 2016-08-21 LAB — BASIC METABOLIC PANEL
Anion gap: 14 (ref 5–15)
BUN: 19 mg/dL (ref 6–20)
CALCIUM: 9.8 mg/dL (ref 8.9–10.3)
CO2: 27 mmol/L (ref 22–32)
Chloride: 91 mmol/L — ABNORMAL LOW (ref 101–111)
Creatinine, Ser: 1.11 mg/dL (ref 0.61–1.24)
GFR calc Af Amer: >60 mL/min (ref >60)
GLUCOSE: 407 mg/dL — AB (ref 65–99)
Potassium: 3.3 mmol/L — ABNORMAL LOW (ref 3.5–5.1)
Sodium: 132 mmol/L — ABNORMAL LOW (ref 135–145)

## 2016-08-21 LAB — CBG MONITORING, ED
GLUCOSE-CAPILLARY: 420 mg/dL — AB (ref 65–99)
GLUCOSE-CAPILLARY: 391 mg/dL — AB (ref 65–99)

## 2016-08-21 LAB — CBC
HEMATOCRIT: 43.6 % (ref 39.0–52.0)
HEMOGLOBIN: 15.8 g/dL (ref 13.0–17.0)
MCH: 31.5 pg (ref 26.0–34.0)
MCHC: 36.2 g/dL — AB (ref 30.0–36.0)
MCV: 86.9 fL (ref 78.0–100.0)
Platelets: 304 10*3/uL (ref 150–400)
RBC: 5.02 MIL/uL (ref 4.22–5.81)
RDW: 11.8 % (ref 11.5–15.5)
WBC: 9.2 10*3/uL (ref 4.0–10.5)

## 2016-08-21 MED ORDER — SODIUM CHLORIDE 0.9 % IV BOLUS (SEPSIS)
1000.0000 mL | Freq: Once | INTRAVENOUS | Status: AC
  Administered 2016-08-21: 1000 mL via INTRAVENOUS

## 2016-08-21 MED ORDER — SODIUM CHLORIDE 0.9 % IV BOLUS (SEPSIS)
1000.0000 mL | Freq: Once | INTRAVENOUS | Status: AC
  Administered 2016-08-22: 1000 mL via INTRAVENOUS

## 2016-08-21 MED ORDER — INSULIN ASPART 100 UNIT/ML ~~LOC~~ SOLN
6.0000 [IU] | Freq: Once | SUBCUTANEOUS | Status: AC
  Administered 2016-08-21: 6 [IU] via INTRAVENOUS
  Filled 2016-08-21: qty 1

## 2016-08-21 NOTE — ED Notes (Signed)
Pt states he is having troubling controlling sugars, also has ran out of insulin needles. Pt currently sitting in room eating bag of pretzels... Also requesting to be admitted "for a few days" so his sugars can be controlled. Explained to pt that controlling sugars is not a reason for admission, he would need to see his PCP for that. Explained that while yes his sugar is high, it does not indicate he is in DKA.

## 2016-08-21 NOTE — ED Notes (Signed)
Pt. Refused the CBg. He stated, "I did it before I left it was 342."  I just want one stick.  Pt. Has agreed to the labs.

## 2016-08-21 NOTE — ED Triage Notes (Signed)
Pt. 's blood sugars have been very abnormal due to his diet and insulin, recenlty he has ran out of his needles and he has not been able to  Take insulin.  He also has been dropping and becoming very shaky and sweaty.  Alert and oriented X4.   Denies pain but is having dizziness.

## 2016-08-21 NOTE — ED Notes (Signed)
EDP aware of CBG 391

## 2016-08-21 NOTE — ED Notes (Signed)
EDP at bedside, pt also requesting apple sauce. Informed pt that apple sauce would inc his sugar. Not given.

## 2016-08-21 NOTE — ED Provider Notes (Signed)
[facility] DEPT Provider Note   CSN: [id number] Arrival date & time: 08/21/16  1809     History   Chief Complaint Chief Complaint  Patient presents with  . Hyperglycemia    HPI Manuel Kramer is a 60 y.o. male who presents with hyperglycemia and lightheadedness. PMH significant for DM and HTN. He states he was first diagnosed with diabetes in 2001. He is supposed to be taking his 70/30 insulin 3 times daily with meals however he states he doesn't take insulin three times a day because he doesn't like needles. Instead he takes it once a day after eating one big meal and then tries to "balance it out" by not eating and taking walks. Additionally, he states he hasn't been taking the insulin for the past 2 weeks due to issues with hypoglycemia (in the 50s). He saw Dr. Jarold Song in July who increased his insulin to 45 units daily because his A1c was 9.8. This dose has been making him "pass out" for several hours. He states he was previously taking 36 units once a day which he didn't have an issues with. Endorses 80 pound weight loss over the past year although on review of EMR it appears he has lost ~ 18 pounds. He states he has been eating a vegetarian diet since watching "what the health" for the past couple months. Also reports increased positional lightheadedness and urinary frequency. He is asking for prescriptions for Metformin and Lyrica as well as referral to endocrinologist. He is also asking to be admitted on an insulin drip "for a couple days". Denies fever, chills, HA, chest pain, SOB, cough, abdominal pain, N/V.   HPI  Past Medical History:  Diagnosis Date  . Diabetes mellitus without complication ([place])   . Hypertension     Patient Active Problem List   Diagnosis Date Noted  . Hyperlipidemia 05/22/2016  . Diabetic neuropathy ([place]) 05/22/2016  . Other specified diabetes mellitus without complications [id number]  . Essential hypertension 10/02/2014  . Smoking 10/02/2014  .  Family history of prostate cancer 10/02/2014  . Renal insufficiency 10/02/2014    Past Surgical History:  Procedure Laterality Date  . COLOSCOPY      POLYP       Home Medications    Prior to Admission medications   Medication Sig Start Date End Date Taking? Authorizing Provider  amLODipine (NORVASC) 10 MG tablet Take 10 mg by mouth daily.   Yes Historical Provider, MD  atenolol-chlorthalidone (TENORETIC) 100-25 MG tablet Take 1 tablet by mouth daily. 05/22/16  Yes Arnoldo Morale, MD  atorvastatin (LIPITOR) 10 MG tablet Take 1 tablet (10 mg total) by mouth daily. 05/22/16  Yes Arnoldo Morale, MD  cloNIDine (CATAPRES) 0.1 MG tablet Take 0.1 mg by mouth 2 (two) times daily.   Yes Historical Provider, MD  gabapentin (NEURONTIN) 300 MG capsule Take 1 capsule (300 mg total) by mouth 3 (three) times daily. 05/22/16  Yes Arnoldo Morale, MD  insulin NPH-regular Human (NOVOLIN 70/30) (70-30) 100 UNIT/ML injection Inject 45 units subcutaneously twice daily with a meal 05/22/16  Yes Arnoldo Morale, MD  lisinopril (PRINIVIL,ZESTRIL) 40 MG tablet Take 1 tablet (40 mg total) by mouth daily. 05/22/16  Yes Arnoldo Morale, MD  ACCU-CHEK SOFTCLIX LANCETS lancets Use as instructed 06/19/16   Arnoldo Morale, MD  Blood Glucose Monitoring Suppl (ACCU-CHEK AVIVA PLUS) w/Device KIT Use as directed 06/19/16   Arnoldo Morale, MD  glucose blood (ACCU-CHEK AVIVA PLUS) test strip Use as instructed 06/19/16   Arnoldo Morale,  MD  Lancet Devices [facility]) lancets Use as instructed Patient not taking: Reported on 08/21/2016 06/19/16   Arnoldo Morale, MD  nicotine (NICODERM CQ - DOSED IN MG/24 HOURS) 21 mg/24hr patch Place 1 patch (21 mg total) onto the skin daily. Patient not taking: Reported on 08/21/2016 07/01/15   Lance Bosch, NP  NIFEdipine (PROCARDIA XL/ADALAT-CC) 60 MG 24 hr tablet Take 1 tablet (60 mg total) by mouth daily. Patient not taking: Reported on 08/21/2016 05/22/16   Arnoldo Morale, MD  traMADol (ULTRAM) 50 MG tablet  Take 1 tablet (50 mg total) by mouth every 8 (eight) hours as needed. Patient not taking: Reported on 08/21/2016 09/22/15   Lance Bosch, NP    Family History Family History  Problem Relation Age of Onset  . Cancer Mother   . Diabetes Mother   . Hypertension Mother   . Cancer Father     Social History Social History  Substance Use Topics  . Smoking status: Current Every Day Smoker    Packs/day: 0.50    Years: 40.00    Types: Cigarettes  . Smokeless tobacco: Current User  . Alcohol use 0.6 oz/week    1 Cans of beer per week     Comment: 2-3 shots of liqour, a day over the past week.      Allergies   Review of patient's allergies indicates no known allergies.   Review of Systems Review of Systems  Constitutional: Positive for unexpected weight change. Negative for chills and fever.  Respiratory: Negative for cough and shortness of breath.   Cardiovascular: Negative for chest pain.  Gastrointestinal: Negative for abdominal pain, nausea and vomiting.  Neurological: Positive for numbness.  All other systems reviewed and are negative.    Physical Exam Updated Vital Signs BP (!) 147/102   Pulse 70   Temp 98.7 F (37.1 C)   Resp 19   Ht _0  (1.575 m)   Wt 57.6 kg   SpO2 98%   BMI 23.23 kg/m   Physical Exam  Constitutional: He is oriented to person, place, and time. He appears well-developed and well-nourished. No distress.  Eating a sandwich when I walk in room  HENT:  Head: Normocephalic and atraumatic.  Eyes: Conjunctivae are normal. Pupils are equal, round, and reactive to light. Right eye exhibits no discharge. Left eye exhibits no discharge. No scleral icterus.  Neck: Normal range of motion. Neck supple.  Cardiovascular: Normal rate and regular rhythm.  Exam reveals no gallop and no friction rub.   No murmur heard. Pulmonary/Chest: Effort normal and breath sounds normal. No respiratory distress. He has no wheezes. He has no rales. He exhibits no  tenderness.  Abdominal: Soft. Bowel sounds are normal. He exhibits no distension and no mass. There is no tenderness. There is no rebound and no guarding. No hernia.  Musculoskeletal: He exhibits no edema.  Neurological: He is alert and oriented to person, place, and time.  Skin: Skin is warm and dry.  Psychiatric: He has a normal mood and affect.  Nursing note and vitals reviewed.    ED Treatments / Results  Labs (all labs ordered are listed, but only abnormal results are displayed) Labs Reviewed  BASIC METABOLIC PANEL - Abnormal; Notable for the following:       Result Value   Sodium 132 (*)    Potassium 3.3 (*)    Chloride 91 (*)    Glucose, Bld 407 (*)    All other components within normal limits  CBC - Abnormal; Notable for the following:    MCHC 36.2 (*)    All other components within normal limits  URINALYSIS, ROUTINE W REFLEX MICROSCOPIC (NOT AT [facility]) - Abnormal; Notable for the following:    Color, Urine STRAW (*)    Specific Gravity, Urine 1.039 (*)    Glucose, UA >1000 (*)    Ketones, ur 15 (*)    All other components within normal limits  URINE MICROSCOPIC-ADD ON - Abnormal; Notable for the following:    Squamous Epithelial / LPF 0-5 (*)    Bacteria, UA RARE (*)    All other components within normal limits  CBG MONITORING, ED - Abnormal; Notable for the following:    Glucose-Capillary 420 (*)    All other components within normal limits  CBG MONITORING, ED - Abnormal; Notable for the following:    Glucose-Capillary 391 (*)    All other components within normal limits  CBG MONITORING, ED - Abnormal; Notable for the following:    Glucose-Capillary 340 (*)    All other components within normal limits    EKG  EKG Interpretation None       Radiology No results found.  Procedures Procedures (including critical care time)  Medications Ordered in ED Medications  sodium chloride 0.9 % bolus 1,000 mL (0 mLs Intravenous Stopped 08/21/16 2251)  sodium  chloride 0.9 % bolus 1,000 mL (0 mLs Intravenous Stopped 08/22/16 0047)  insulin aspart (novoLOG) injection 6 Units (6 Units Intravenous Given 08/21/16 2350)  sodium chloride 0.9 % bolus 1,000 mL (1,000 mLs Intravenous New Bag/Given 08/22/16 0048)  potassium chloride SA (K-DUR,KLOR-CON) CR tablet 40 mEq (40 mEq Oral Given 08/22/16 0048)     Initial Impression / Assessment and Plan / ED Course  I have reviewed the triage vital signs and the nursing notes.  Pertinent labs & imaging results that were available during my care of the patient were reviewed by me and considered in my medical decision making (see chart for details).  Clinical Course   60 year old male presents with hyperglycemia and evidence of dehydration. Patient is afebrile, not tachycardic or tachypneic, and not hypoxic. He is hypertensive - has hx of HTN and is generally non-adherent to his medicines which is his main problem. He refuses to take his insulin as prescribed (3 times daily) due to not liking needles. He has been prescribed to endocrinologist in the past but did not go.   CBC unremarkable. BMP remarkable for glucose of 407, Na of 132, K of 3.3, Cl of 91. 58mq K given. UA is remarkable for >1000 glucose in urine, 15 ketones, with high specific gravity. 3 IVF given. 6 units insulin given.   [place] and lowering of BG. Anticipate d/c.  Final Clinical Impressions(s) / ED Diagnoses   Final diagnoses:  Hyperglycemia  Dehydration    New Prescriptions New Prescriptions   No medications on file     KRecardo Evangelist PA-C 08/22/16 0109    EGareth Morgan MD 08/26/16 1530

## 2016-08-22 ENCOUNTER — Telehealth (HOSPITAL_BASED_OUTPATIENT_CLINIC_OR_DEPARTMENT_OTHER): Payer: Self-pay | Admitting: Emergency Medicine

## 2016-08-22 LAB — CBG MONITORING, ED
Glucose-Capillary: 340 mg/dL — ABNORMAL HIGH (ref 65–99)
Glucose-Capillary: 355 mg/dL — ABNORMAL HIGH (ref 65–99)

## 2016-08-22 MED ORDER — POTASSIUM CHLORIDE CRYS ER 20 MEQ PO TBCR
40.0000 meq | EXTENDED_RELEASE_TABLET | Freq: Once | ORAL | Status: AC
  Administered 2016-08-22: 40 meq via ORAL
  Filled 2016-08-22: qty 2

## 2016-08-22 MED ORDER — ACETAMINOPHEN 500 MG PO TABS: 500.0000 mg | ORAL_TABLET | Freq: Four times a day (QID) | ORAL | 0 refills | Status: AC | PRN

## 2016-08-22 NOTE — ED Notes (Signed)
Pt left phone charger in room. Pt was called and left voicemail, instructed charger would be at the front desk

## 2016-08-23 ENCOUNTER — Ambulatory Visit: Payer: Medicaid Other | Admitting: Family Medicine

## 2016-08-29 ENCOUNTER — Other Ambulatory Visit: Payer: Self-pay | Admitting: Pharmacist

## 2016-08-29 DIAGNOSIS — E1149 Type 2 diabetes mellitus with other diabetic neurological complication: Secondary | ICD-10-CM

## 2016-08-29 MED ORDER — GABAPENTIN 300 MG PO CAPS: 300.0000 mg | ORAL_CAPSULE | Freq: Three times a day (TID) | ORAL | 0 refills | Status: DC

## 2016-09-08 ENCOUNTER — Ambulatory Visit: Payer: Medicaid Other | Admitting: Family Medicine

## 2016-09-14 ENCOUNTER — Other Ambulatory Visit: Payer: Self-pay | Admitting: Pharmacist

## 2016-09-14 MED ORDER — CLONIDINE HCL 0.1 MG PO TABS: 0.1000 mg | ORAL_TABLET | Freq: Two times a day (BID) | ORAL | 0 refills | Status: DC

## 2016-09-14 MED ORDER — AMLODIPINE BESYLATE 10 MG PO TABS: 10.0000 mg | ORAL_TABLET | Freq: Every day | ORAL | 0 refills | Status: DC

## 2016-10-05 ENCOUNTER — Other Ambulatory Visit: Payer: Self-pay | Admitting: Pharmacist

## 2016-10-05 DIAGNOSIS — E785 Hyperlipidemia, unspecified: Secondary | ICD-10-CM

## 2016-10-05 DIAGNOSIS — I10 Essential (primary) hypertension: Secondary | ICD-10-CM

## 2016-10-05 MED ORDER — ATENOLOL-CHLORTHALIDONE 100-25 MG PO TABS: 1.0000 | ORAL_TABLET | Freq: Every day | ORAL | 0 refills | Status: DC

## 2016-10-05 MED ORDER — ATORVASTATIN CALCIUM 10 MG PO TABS: 10.0000 mg | ORAL_TABLET | Freq: Every day | ORAL | 0 refills | Status: DC

## 2016-10-09 ENCOUNTER — Ambulatory Visit: Payer: Medicaid Other | Admitting: Family Medicine

## 2016-10-18 ENCOUNTER — Other Ambulatory Visit: Payer: Self-pay | Admitting: *Deleted

## 2016-10-18 MED ORDER — AMLODIPINE BESYLATE 10 MG PO TABS: 10.0000 mg | ORAL_TABLET | Freq: Every day | ORAL | 0 refills | Status: DC

## 2016-11-03 ENCOUNTER — Other Ambulatory Visit: Payer: Self-pay | Admitting: Family Medicine

## 2016-11-03 DIAGNOSIS — I10 Essential (primary) hypertension: Secondary | ICD-10-CM

## 2016-11-24 ENCOUNTER — Other Ambulatory Visit: Payer: Self-pay | Admitting: Family Medicine

## 2016-11-27 ENCOUNTER — Other Ambulatory Visit: Payer: Self-pay | Admitting: Family Medicine

## 2016-11-27 DIAGNOSIS — E785 Hyperlipidemia, unspecified: Secondary | ICD-10-CM

## 2016-11-27 DIAGNOSIS — I10 Essential (primary) hypertension: Secondary | ICD-10-CM

## 2016-11-28 ENCOUNTER — Other Ambulatory Visit: Payer: Self-pay | Admitting: Family Medicine

## 2016-11-28 DIAGNOSIS — E1149 Type 2 diabetes mellitus with other diabetic neurological complication: Secondary | ICD-10-CM

## 2017-01-03 ENCOUNTER — Other Ambulatory Visit: Payer: Self-pay | Admitting: Family Medicine

## 2017-01-03 DIAGNOSIS — I10 Essential (primary) hypertension: Secondary | ICD-10-CM

## 2017-01-05 ENCOUNTER — Other Ambulatory Visit: Payer: Self-pay | Admitting: Family Medicine

## 2017-02-12 ENCOUNTER — Other Ambulatory Visit: Payer: Self-pay | Admitting: Family Medicine

## 2017-03-01 ENCOUNTER — Other Ambulatory Visit: Payer: Self-pay | Admitting: Family Medicine

## 2017-03-01 DIAGNOSIS — I10 Essential (primary) hypertension: Secondary | ICD-10-CM

## 2017-05-14 ENCOUNTER — Other Ambulatory Visit: Payer: Self-pay | Admitting: Family Medicine

## 2017-06-07 ENCOUNTER — Other Ambulatory Visit: Payer: Self-pay | Admitting: Family Medicine

## 2017-06-07 DIAGNOSIS — I10 Essential (primary) hypertension: Secondary | ICD-10-CM

## 2017-06-13 ENCOUNTER — Other Ambulatory Visit: Payer: Self-pay | Admitting: Family Medicine

## 2017-07-25 ENCOUNTER — Other Ambulatory Visit: Payer: Self-pay | Admitting: Family Medicine

## 2017-07-27 ENCOUNTER — Other Ambulatory Visit: Payer: Self-pay | Admitting: Family Medicine

## 2018-01-20 ENCOUNTER — Emergency Department (HOSPITAL_COMMUNITY)
Admission: EM | Admit: 2018-01-20 | Discharge: 2018-01-20 | Disposition: A | Discharge status: Home / Self Care | Payer: Medicaid Other | Attending: Emergency Medicine | Admitting: Emergency Medicine

## 2018-01-20 ENCOUNTER — Encounter (HOSPITAL_COMMUNITY): Payer: Self-pay | Admitting: Emergency Medicine

## 2018-01-20 ENCOUNTER — Emergency Department (HOSPITAL_COMMUNITY): Payer: Medicaid Other

## 2018-01-20 ENCOUNTER — Other Ambulatory Visit: Payer: Self-pay

## 2018-01-20 DIAGNOSIS — R739 Hyperglycemia, unspecified: Secondary | ICD-10-CM

## 2018-01-20 DIAGNOSIS — F17210 Nicotine dependence, cigarettes, uncomplicated: Secondary | ICD-10-CM | POA: Diagnosis not present

## 2018-01-20 DIAGNOSIS — Z794 Long term (current) use of insulin: Secondary | ICD-10-CM | POA: Insufficient documentation

## 2018-01-20 DIAGNOSIS — R42 Dizziness and giddiness: Secondary | ICD-10-CM | POA: Diagnosis not present

## 2018-01-20 DIAGNOSIS — Z79899 Other long term (current) drug therapy: Secondary | ICD-10-CM | POA: Diagnosis not present

## 2018-01-20 DIAGNOSIS — R197 Diarrhea, unspecified: Secondary | ICD-10-CM | POA: Diagnosis present

## 2018-01-20 DIAGNOSIS — E1165 Type 2 diabetes mellitus with hyperglycemia: Secondary | ICD-10-CM | POA: Diagnosis not present

## 2018-01-20 DIAGNOSIS — H81399 Other peripheral vertigo, unspecified ear: Secondary | ICD-10-CM | POA: Diagnosis not present

## 2018-01-20 DIAGNOSIS — I10 Essential (primary) hypertension: Secondary | ICD-10-CM | POA: Diagnosis not present

## 2018-01-20 LAB — URINALYSIS, ROUTINE W REFLEX MICROSCOPIC
BACTERIA UA: NONE SEEN
Bilirubin Urine: NEGATIVE
Glucose, UA: >=500 mg/dL — AB
HGB URINE DIPSTICK: NEGATIVE
Ketones, ur: 20 mg/dL — AB
Leukocytes, UA: NEGATIVE
Nitrite: NEGATIVE
PROTEIN: 30 mg/dL — AB
SPECIFIC GRAVITY, URINE: 1.032 — AB (ref 1.005–1.030)
Squamous Epithelial / LPF: NONE SEEN
pH: 5 (ref 5.0–8.0)

## 2018-01-20 LAB — COMPREHENSIVE METABOLIC PANEL
ALK PHOS: 116 U/L (ref 38–126)
ALT: 61 U/L (ref 17–63)
AST: 36 U/L (ref 15–41)
Albumin: 3.9 g/dL (ref 3.5–5.0)
Anion gap: 14 (ref 5–15)
BUN: 14 mg/dL (ref 6–20)
CALCIUM: 9.4 mg/dL (ref 8.9–10.3)
CHLORIDE: 96 mmol/L — AB (ref 101–111)
CO2: 24 mmol/L (ref 22–32)
Creatinine, Ser: 1.06 mg/dL (ref 0.61–1.24)
Glucose, Bld: 294 mg/dL — ABNORMAL HIGH (ref 65–99)
Potassium: 3.7 mmol/L (ref 3.5–5.1)
Sodium: 134 mmol/L — ABNORMAL LOW (ref 135–145)
Total Bilirubin: 1 mg/dL (ref 0.3–1.2)
Total Protein: 7.2 g/dL (ref 6.5–8.1)

## 2018-01-20 LAB — CBC
HEMATOCRIT: 44 % (ref 39.0–52.0)
HEMOGLOBIN: 15.2 g/dL (ref 13.0–17.0)
MCH: 31.5 pg (ref 26.0–34.0)
MCHC: 34.5 g/dL (ref 30.0–36.0)
MCV: 91.1 fL (ref 78.0–100.0)
Platelets: 256 10*3/uL (ref 150–400)
RBC: 4.83 MIL/uL (ref 4.22–5.81)
RDW: 12.6 % (ref 11.5–15.5)
WBC: 7.9 10*3/uL (ref 4.0–10.5)

## 2018-01-20 LAB — CBG MONITORING, ED: Glucose-Capillary: 272 mg/dL — ABNORMAL HIGH (ref 65–99)

## 2018-01-20 IMAGING — CT CT HEAD W/O CM
4 series · 15 of 47 positions shown, 17 images · non-contrast
Comparison: None.

CLINICAL DATA: Acute dizziness beginning this afternoon. Possible
stroke.

EXAM:
CT HEAD WITHOUT CONTRAST
TECHNIQUE: Contiguous axial images were obtained from the base of the skull
through the vertex without intravenous contrast.

[Series 3: head wo · axial · 0.47mm/px · z∈[-120,+0]mm · 7 of 34 slices shown, 9 images]
[im 5/34  brain]
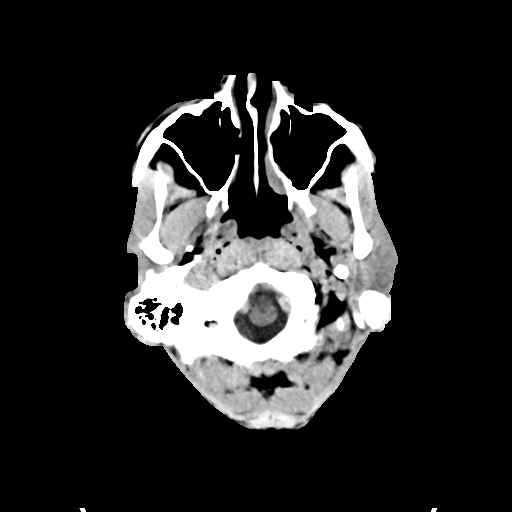
[im 5/34  bone]
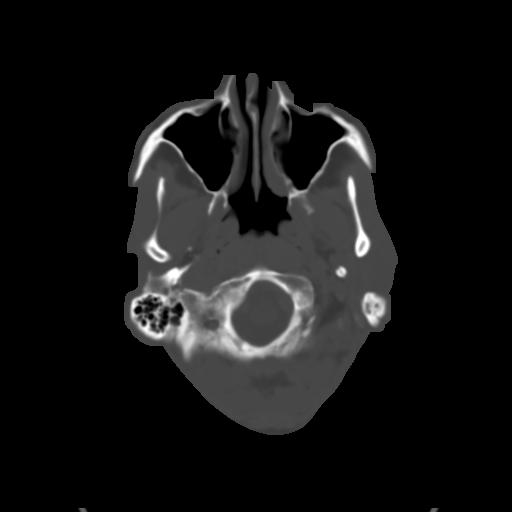
[im 9/34  brain]
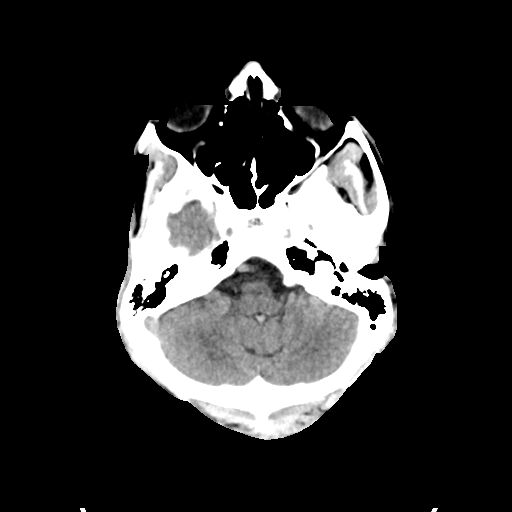
[im 13/34  brain]
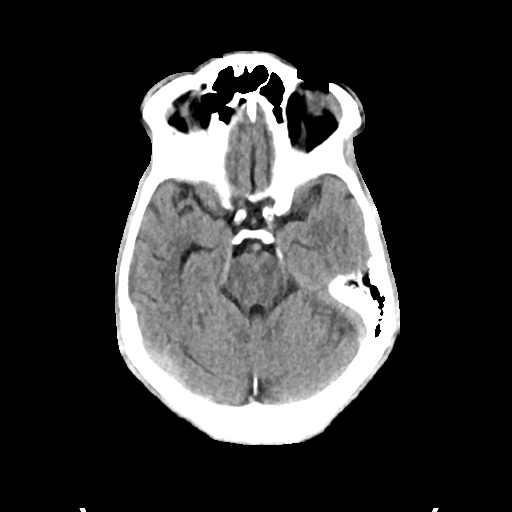
[im 17/34  brain]
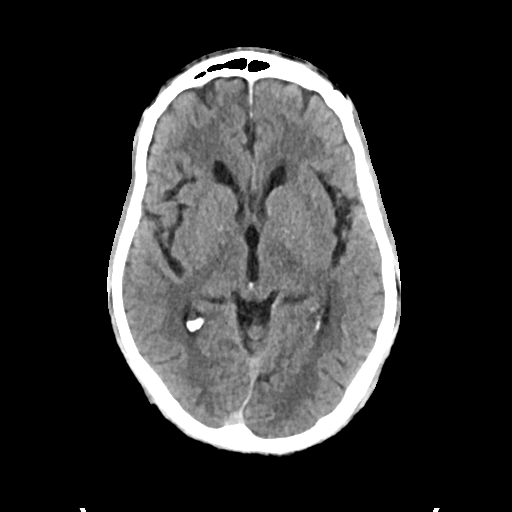
[im 21/34  brain]
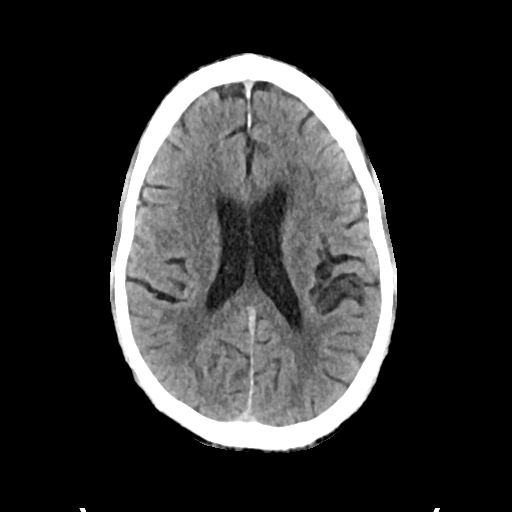
[im 21/34  bone]
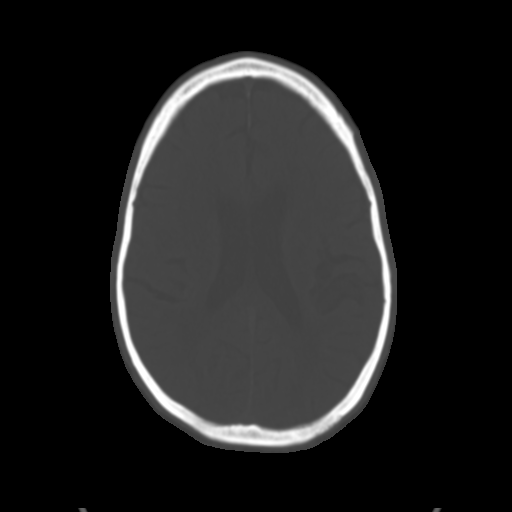
[im 25/34  brain]
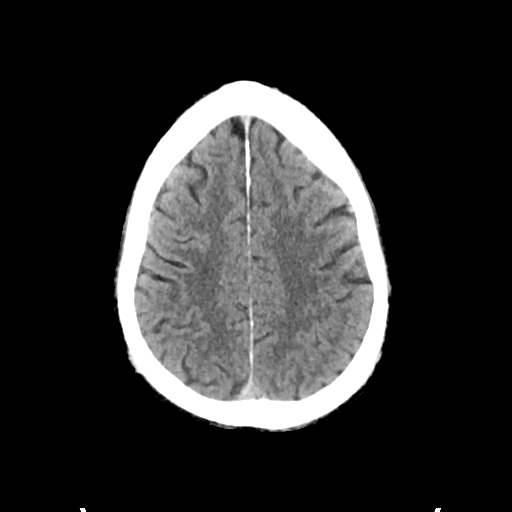
[im 29/34  brain]
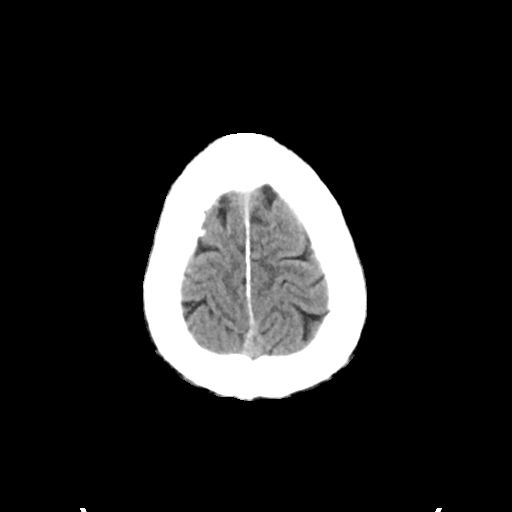

[Series 4: head bone · axial · 0.47mm/px · z∈[-124,-108]mm · 2 of 85 slices shown]
[im 9/85  bone]
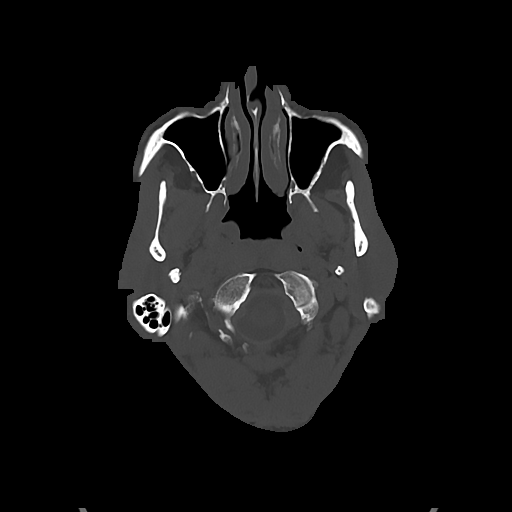
[im 17/85  bone]
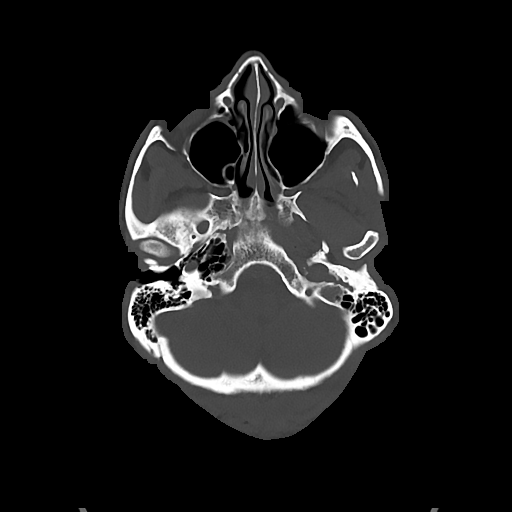

[Series 5: cor soft · coronal · 0.34mm/px · 3 of 73 slices shown]
[im 25/73  brain]
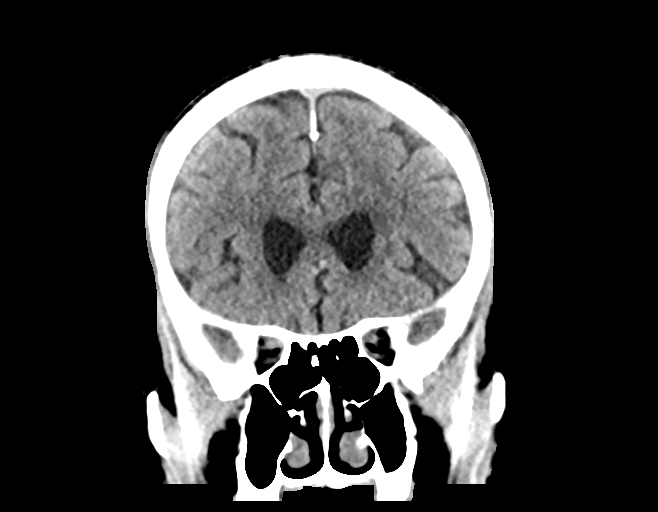
[im 33/73  brain]
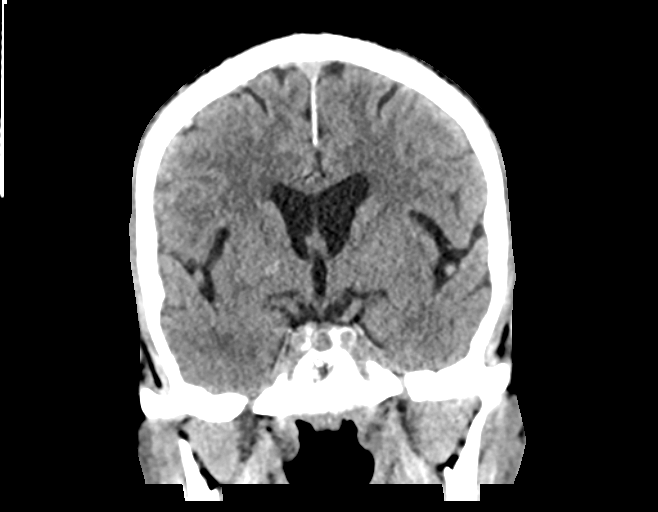
[im 41/73  brain]
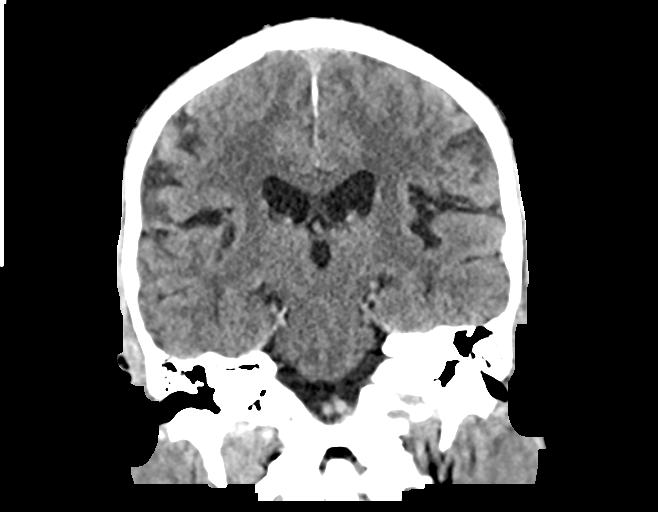

[Series 6: sag soft · sagittal · 0.34mm/px · 3 of 62 slices shown]
[im 21/62  brain]
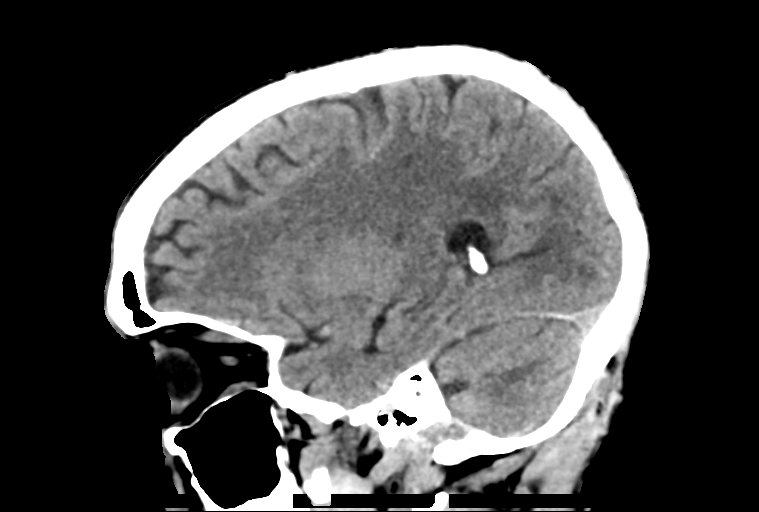
[im 31/62  brain]
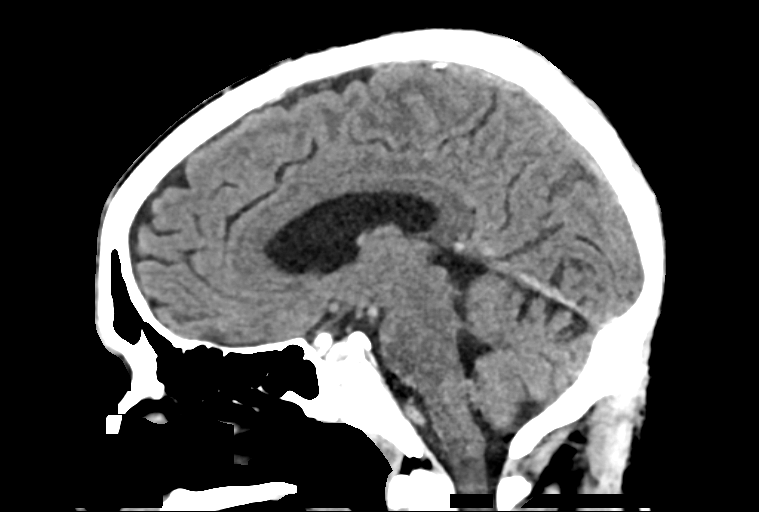
[im 41/62  brain]
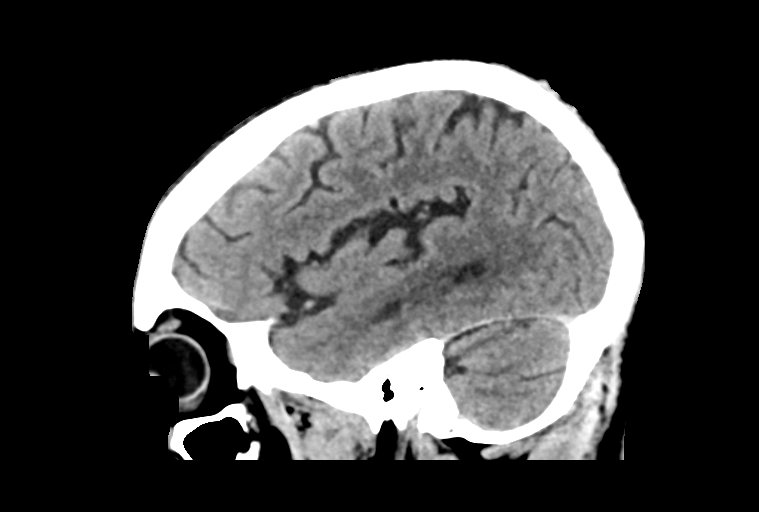

[15 of 47 positions shown; findings below may reference images not displayed]

FINDINGS: Brain: Ventricles, cisterns and other CSF spaces are within normal.
There is chronic ischemic microvascular disease. There is no mass,
mass effect, shift of midline structures or acute hemorrhage.
Possible tiny lacune infarct over the left thalamus. No acute
infarction.

Vascular: No hyperdense vessel or unexpected calcification.

Skull: Normal. Negative for fracture or focal lesion.

Sinuses/Orbits: No acute finding.

Other: None.
IMPRESSION: No acute findings.

Chronic ischemic microvascular disease. Possible small lacunar
infarct over the left thalamus.

## 2018-01-20 MED ORDER — MECLIZINE HCL 25 MG PO TABS
50.0000 mg | ORAL_TABLET | Freq: Once | ORAL | Status: AC
  Administered 2018-01-20: 50 mg via ORAL
  Filled 2018-01-20: qty 2

## 2018-01-20 MED ORDER — AMLODIPINE BESYLATE 5 MG PO TABS
10.0000 mg | ORAL_TABLET | Freq: Once | ORAL | Status: AC
  Administered 2018-01-20: 10 mg via ORAL
  Filled 2018-01-20: qty 2

## 2018-01-20 MED ORDER — MECLIZINE HCL 25 MG PO TABS: 25.0000 mg | ORAL_TABLET | Freq: Three times a day (TID) | ORAL | 0 refills | Status: DC | PRN

## 2018-01-20 NOTE — ED Notes (Addendum)
Pt up from bed and at doorway, asking to go to the bathroom. He was able to ambulate to and from hallway bathroom without any difficulty or need for even standby assistance. Gait normal, steady, and unremarkable. Pt continues to report being "very dizzy". PA aware.

## 2018-01-20 NOTE — ED Notes (Addendum)
Pt departed in NAD, refused use of wheelchair.   Pt's eyeglasses found in room after departure. Attempted to catch pt in front lobby, without success. Left at [facility] with Tiffany, NS, in case pt returned.

## 2018-01-20 NOTE — Discharge Instructions (Signed)
Please read attached information. If you experience any new or worsening signs or symptoms please return to the emergency room for evaluation. Please follow-up with your primary care provider or specialist as discussed. Please use medication prescribed only as directed and discontinue taking if you have any concerning signs or symptoms.   °

## 2018-01-20 NOTE — ED Notes (Signed)
ED Provider at bedside. 

## 2018-01-20 NOTE — ED Notes (Signed)
Pt was eating a Nutter butter dessert when I brought him back to the room. PT refused a blood sugar check once in room.

## 2018-01-20 NOTE — ED Provider Notes (Signed)
[facility] EMERGENCY DEPARTMENT Provider Note   CSN: [id number] Arrival date & time: 01/20/18  1705   History   Chief Complaint Chief Complaint  Patient presents with  . Hyperglycemia  . Dizziness    HPI Manuel Kramer is a 62 y.o. male.  HPI   62 year old male with a past medical history of hyperglycemia, hypertension, and type 2 diabetes presents today with several complaints.  Patient notes approximately 6 months of ongoing watery diarrhea.  He notes this is worse at night.  He has been seen by primary care in emergency room approximately 4 times with no acute findings.  He was referred to outpatient gastroenterology but did not make his appointment.  Patient notes this was all done in the [facility]  Patient notes that he is taken Imodium and also given a prescription (Lomotil) which when he doubles the dose at night allows him to sleep through the night.  Patient denies any abnormal exposures, reports that they have not been able to find the cause of this.  He denies any associated abdominal pain or fever.  Patient notes that he woke up at approximately 230 this afternoon approximately 7 hours prior to arrival and had severe dizziness when trying to get out of bed.  Patient notes that when he rests and does not move his head he has no significant symptoms, reports that this also comes out of the blue.  Patient reports he is able to sit in the bed move his head around without significant symptoms.  He notes the symptoms were worse earlier with bending forward and driving in the car.  Patient notes some minor associated nausea while in the car, none presently.  She notes he has chronic numbness in the pinky is, no new neurological deficits, no head trauma.  Patient denies any associated chest pain or shortness of breath.  Patient also notes a history of diabetes he reports that his driver's license was taken away as his blood sugar was in the thousand range.  He notes that he  does not want to take his insulin as he feels he can manage this with diet and exercise and has not used it in the last week.   Patient also reports he has not taken his blood pressure medication.  Past Medical History:  Diagnosis Date  . Diabetes mellitus without complication ([facility])   . Hypertension     Patient Active Problem List   Diagnosis Date Noted  . Hyperlipidemia 05/22/2016  . Diabetic neuropathy ([facility]) 05/22/2016  . Other specified diabetes mellitus without complications ([facility]) 10/02/2014  . Essential hypertension 10/02/2014  . Smoking 10/02/2014  . Family history of prostate cancer 10/02/2014  . Renal insufficiency 10/02/2014    Past Surgical History:  Procedure Laterality Date  . COLOSCOPY      POLYP       Home Medications    Prior to Admission medications   Medication Sig Start Date End Date Taking? Authorizing Provider  amLODipine (NORVASC) 10 MG tablet Take 1 tablet (10 mg total) by mouth daily. 01/05/17  Yes Hoy Register, MD  atenolol-chlorthalidone (TENORETIC) 100-25 MG tablet Take 1 tablet by mouth daily. 11/27/16  Yes Hoy Register, MD  cloNIDine (CATAPRES) 0.1 MG tablet Take 1 tablet (0.1 mg total) by mouth 2 (two) times daily. 09/14/16  Yes Hoy Register, MD  Insulin Glargine (LANTUS SOLOSTAR) 100 UNIT/ML Solostar Pen Inject 30 Units into the skin at bedtime. 11/23/17  Yes [provider]  lisinopril (PRINIVIL,ZESTRIL)  40 MG tablet Take 1 tablet (40 mg total) by mouth daily. 01/04/17  Yes Hoy Register, MD  acetaminophen (TYLENOL) 500 MG tablet Take 1 tablet (500 mg total) by mouth every 6 (six) hours as needed. Patient not taking: Reported on 01/20/2018 08/22/16   Elpidio Anis, PA-C  atorvastatin (LIPITOR) 10 MG tablet Take 1 tablet (10 mg total) by mouth daily. Patient not taking: Reported on 01/20/2018 11/27/16   Hoy Register, MD  gabapentin (NEURONTIN) 300 MG capsule Take 1 capsule (300 mg total) by mouth 3 (three) times daily. Patient not  taking: Reported on 01/20/2018 11/28/16   Quentin Angst, MD  insulin NPH-regular Human (NOVOLIN 70/30) (70-30) 100 UNIT/ML injection Inject 45 units subcutaneously twice daily with a meal Patient not taking: Reported on 01/20/2018 05/22/16   Hoy Register, MD  meclizine (ANTIVERT) 25 MG tablet Take 1 tablet (25 mg total) by mouth 3 (three) times daily as needed for dizziness. 01/20/18   Eyvonne Mechanic, PA-C    Family History Family History  Problem Relation Age of Onset  . Cancer Mother   . Diabetes Mother   . Hypertension Mother   . Cancer Father     Social History Social History   Tobacco Use  . Smoking status: Current Every Day Smoker    Packs/day: 0.50    Years: 40.00    Pack years: 20.00    Types: Cigarettes  . Smokeless tobacco: Current User  Substance Use Topics  . Alcohol use: Not Currently    Alcohol/week: 0.6 oz    Types: 1 Cans of beer per week    Comment: no etoh in 4-5 months  . Drug use: No     Allergies   Patient has no known allergies.   Review of Systems Review of Systems  All other systems reviewed and are negative.  Physical Exam Updated Vital Signs BP (!) 173/113   Pulse 88   Temp (!) 97.3 F (36.3 C) (Oral)   Resp 11   Ht 5\' 4"  (1.626 m)   Wt 59 kg (130 lb)   SpO2 95%   BMI 22.31 kg/m   Physical Exam  Constitutional: He is oriented to person, place, and time. He appears well-developed and well-nourished.  HENT:  Head: Normocephalic and atraumatic.  Eyes: Pupils are equal, round, and reactive to light. Conjunctivae are normal. Right eye exhibits no discharge. Left eye exhibits no discharge. No scleral icterus.  Neck: Normal range of motion. No JVD present. No tracheal deviation present.  Pulmonary/Chest: Effort normal. No stridor.  Abdominal: Soft. Bowel sounds are normal. He exhibits no distension and no mass. There is no tenderness. There is no rebound and no guarding. No hernia.  Neurological: He is alert and oriented to  person, place, and time. He has normal strength. No cranial nerve deficit or sensory deficit. Coordination normal. GCS eye subscore is 4. GCS verbal subscore is 5. GCS motor subscore is 6.  Psychiatric: He has a normal mood and affect. His behavior is normal. Judgment and thought content normal.  Nursing note and vitals reviewed.    ED Treatments / Results  Labs (all labs ordered are listed, but only abnormal results are displayed) Labs Reviewed  URINALYSIS, ROUTINE W REFLEX MICROSCOPIC - Abnormal; Notable for the following components:      Result Value   Color, Urine STRAW (*)    Specific Gravity, Urine 1.032 (*)    Glucose, UA >=500 (*)    Ketones, ur 20 (*)  Protein, ur 30 (*)    All other components within normal limits  COMPREHENSIVE METABOLIC PANEL - Abnormal; Notable for the following components:   Sodium 134 (*)    Chloride 96 (*)    Glucose, Bld 294 (*)    All other components within normal limits  CBG MONITORING, ED - Abnormal; Notable for the following components:   Glucose-Capillary 272 (*)    All other components within normal limits  CBC  CBG MONITORING, ED    EKG EKG Interpretation  Date/Time:  Sunday January 20 2018 17:31:52 EDT Ventricular Rate:  83 PR Interval:  142 QRS Duration: 84 QT Interval:  388 QTC Calculation: 455 R Axis:   32 Text Interpretation:  Normal sinus rhythm Biatrial enlargement Left ventricular hypertrophy Cannot rule out Septal infarct , age undetermined Abnormal ECG TW with peaked appearance in anterior leads, similar ratio to voltage compared to prior, no other significant changes Confirmed by Alvira MondaySchlossman, Erin ([id number]) on 01/20/2018 8:06:59 PM   Radiology Ct Head Wo Contrast  Result Date: 01/20/2018 CLINICAL DATA:  Acute dizziness beginning this afternoon. Possible stroke. EXAM: CT HEAD WITHOUT CONTRAST TECHNIQUE: Contiguous axial images were obtained from the base of the skull through the vertex without intravenous contrast.  COMPARISON:  None. FINDINGS: Brain: Ventricles, cisterns and other CSF spaces are within normal. There is chronic ischemic microvascular disease. There is no mass, mass effect, shift of midline structures or acute hemorrhage. Possible tiny lacune infarct over the left thalamus. No acute infarction. Vascular: No hyperdense vessel or unexpected calcification. Skull: Normal. Negative for fracture or focal lesion. Sinuses/Orbits: No acute finding. Other: None. IMPRESSION: No acute findings. Chronic ischemic microvascular disease. Possible small lacunar infarct over the left thalamus. Electronically Signed   By: Elberta Fortisaniel  Boyle M.D.   On: 01/20/2018 18:26    Procedures Procedures (including critical care time)  Medications Ordered in ED Medications  meclizine (ANTIVERT) tablet 50 mg (50 mg Oral Given 01/20/18 2139)  amLODipine (NORVASC) tablet 10 mg (10 mg Oral Given 01/20/18 2209)  meclizine (ANTIVERT) tablet 50 mg (50 mg Oral Given 01/20/18 2222)     Initial Impression / Assessment and Plan / ED Course  I have reviewed the triage vital signs and the nursing notes.  Pertinent labs & imaging results that were available during my care of the patient were reviewed by me and considered in my medical decision making (see chart for details).     Final Clinical Impressions(s) / ED Diagnoses   Final diagnoses:  Hyperglycemia  Peripheral vertigo, unspecified laterality  Diarrhea, unspecified type    Labs: CBC, CMP, CBG  Imaging: CT head without  Consults:  Discharge Meds: Meclizine  Assessment/Plan: 62 year old male presents today with several complaints.  Complaint 1 is ongoing diarrhea this appears to be chronic in nature.  Patient is well-appearing in no acute distress.  He has had several ED visits, outpatient gastroenterology appointments made which she has not followed up with.  Patient has no signs of infectious etiology, no significant electrolyte derangements has an soft nontender abdomen  and does not have a fever.  He has no findings that would require further evaluation or management in the ED setting for this.  Patient also having likely peripheral vertigo.  He is well-appearing in the exam bed with no dizziness, he reports no dizziness with head movements, but does report some with standing.  Nursing staff notes that patient ambulated with completely steady gait going from the bathroom to exam room.  Patient with no  neurological deficits, I have very low suspicion for central cause in this patient.  Patient requesting hospital admission for his diarrhea as he is unhappy that [facility] did not admit him for ongoing management.  I had a lengthy discussion with the patient that hospital admission for diarrhea is not appropriate at this time given his findings and that outpatient follow-up would be appropriate.  I have very low suspicion that patient's dizziness is caused by any cardiac or pulmonary etiology.   Upon reassessment patient reports improvement in his symptoms with meclizine, again I have low suspicion for any central cause.  Patient's daughter is at bedside, he is walking around the room without dizziness or abnormal gait.  I find he is appropriate for outpatient follow-up with gastroenterology and primary care.  Patient is given strict return precautions, he verbalized understanding and agreement to today's plan had no further questions or concerns at time of discharge.   ED Discharge Orders        Ordered    meclizine (ANTIVERT) 25 MG tablet  3 times daily PRN     01/20/18 2219       Eyvonne Mechanic, PA-C 01/20/18 2247    Alvira Monday, MD 01/21/18 1400

## 2018-01-20 NOTE — ED Triage Notes (Addendum)
Pt woke up at 7am and felt fine- went back to bed.  Slept until 2:30pm and woke up with generalized weakness and dizziness.  Pt thought blood sugar would be extremely high.  Denies pain.  Speech clear.  No arm drift.  No leg drift.  Also reports diarrhea x 4 months.  Supposed to see PCP tomorrow for referral to GI.

## 2018-01-20 NOTE — ED Notes (Signed)
Yellow armband applied to pt.  States he is unable to give urine sample at this time.  Tech first notified of pt.  Pt states he will need help to go to bathroom because he feels like he would fall due to dizziness.  Daphine DeutscherMartin, EMT aware.

## 2018-11-20 ENCOUNTER — Encounter (HOSPITAL_COMMUNITY): Payer: Self-pay | Admitting: Emergency Medicine

## 2018-11-20 ENCOUNTER — Emergency Department (HOSPITAL_COMMUNITY)
Admission: EM | Admit: 2018-11-20 | Discharge: 2018-11-20 | Disposition: A | Discharge status: Home / Self Care | Payer: Medicaid Other | Attending: Emergency Medicine | Admitting: Emergency Medicine

## 2018-11-20 ENCOUNTER — Other Ambulatory Visit: Payer: Self-pay

## 2018-11-20 DIAGNOSIS — E1140 Type 2 diabetes mellitus with diabetic neuropathy, unspecified: Secondary | ICD-10-CM | POA: Insufficient documentation

## 2018-11-20 DIAGNOSIS — F17210 Nicotine dependence, cigarettes, uncomplicated: Secondary | ICD-10-CM | POA: Diagnosis not present

## 2018-11-20 DIAGNOSIS — Z794 Long term (current) use of insulin: Secondary | ICD-10-CM | POA: Insufficient documentation

## 2018-11-20 DIAGNOSIS — Z79899 Other long term (current) drug therapy: Secondary | ICD-10-CM | POA: Insufficient documentation

## 2018-11-20 DIAGNOSIS — R42 Dizziness and giddiness: Secondary | ICD-10-CM

## 2018-11-20 DIAGNOSIS — I10 Essential (primary) hypertension: Secondary | ICD-10-CM | POA: Diagnosis not present

## 2018-11-20 HISTORY — DX: Noninfective gastroenteritis and colitis, unspecified: K52.9

## 2018-11-20 LAB — COMPREHENSIVE METABOLIC PANEL
ALK PHOS: 80 U/L (ref 38–126)
ALT: 43 U/L (ref 0–44)
AST: 23 U/L (ref 15–41)
Albumin: 3.5 g/dL (ref 3.5–5.0)
Anion gap: 10 (ref 5–15)
BUN: 19 mg/dL (ref 8–23)
CHLORIDE: 99 mmol/L (ref 98–111)
CO2: 27 mmol/L (ref 22–32)
CREATININE: 0.9 mg/dL (ref 0.61–1.24)
Calcium: 9 mg/dL (ref 8.9–10.3)
GFR calc Af Amer: >60 mL/min (ref >60)
GFR calc non Af Amer: >60 mL/min (ref >60)
Glucose, Bld: 228 mg/dL — ABNORMAL HIGH (ref 70–99)
Potassium: 3.3 mmol/L — ABNORMAL LOW (ref 3.5–5.1)
Sodium: 136 mmol/L (ref 135–145)
Total Bilirubin: 0.5 mg/dL (ref 0.3–1.2)
Total Protein: 6.6 g/dL (ref 6.5–8.1)

## 2018-11-20 LAB — CBC WITH DIFFERENTIAL/PLATELET
ABS IMMATURE GRANULOCYTES: 0.04 10*3/uL (ref 0.00–0.07)
Basophils Absolute: 0 10*3/uL (ref 0.0–0.1)
Basophils Relative: 0 %
Eosinophils Absolute: 0.3 10*3/uL (ref 0.0–0.5)
Eosinophils Relative: 3 %
HEMATOCRIT: 39.7 % (ref 39.0–52.0)
HEMOGLOBIN: 12.7 g/dL — AB (ref 13.0–17.0)
Immature Granulocytes: 1 %
LYMPHS ABS: 2.4 10*3/uL (ref 0.7–4.0)
LYMPHS PCT: 29 %
MCH: 31.1 pg (ref 26.0–34.0)
MCHC: 32 g/dL (ref 30.0–36.0)
MCV: 97.3 fL (ref 80.0–100.0)
MONO ABS: 0.8 10*3/uL (ref 0.1–1.0)
MONOS PCT: 9 %
NEUTROS ABS: 4.7 10*3/uL (ref 1.7–7.7)
NEUTROS PCT: 58 %
PLATELETS: 249 10*3/uL (ref 150–400)
RBC: 4.08 MIL/uL — ABNORMAL LOW (ref 4.22–5.81)
RDW: 12.5 % (ref 11.5–15.5)
WBC: 8.2 10*3/uL (ref 4.0–10.5)
nRBC: 0 % (ref 0.0–0.2)

## 2018-11-20 LAB — CBG MONITORING, ED: Glucose-Capillary: 196 mg/dL — ABNORMAL HIGH (ref 70–99)

## 2018-11-20 MED ORDER — SODIUM CHLORIDE 0.9 % IV BOLUS
1000.0000 mL | Freq: Once | INTRAVENOUS | Status: AC
  Administered 2018-11-20: 1000 mL via INTRAVENOUS

## 2018-11-20 MED ORDER — AMLODIPINE BESYLATE 5 MG PO TABS
10.0000 mg | ORAL_TABLET | Freq: Once | ORAL | Status: AC
  Administered 2018-11-20: 10 mg via ORAL
  Filled 2018-11-20: qty 2

## 2018-11-20 MED ORDER — LISINOPRIL 10 MG PO TABS
40.0000 mg | ORAL_TABLET | Freq: Every day | ORAL | Status: DC
  Administered 2018-11-20: 40 mg via ORAL
  Filled 2018-11-20: qty 4

## 2018-11-20 MED ORDER — MECLIZINE HCL 25 MG PO TABS: 25.0000 mg | ORAL_TABLET | Freq: Three times a day (TID) | ORAL | 0 refills | Status: DC | PRN

## 2018-11-20 MED ORDER — MECLIZINE HCL 12.5 MG PO TABS
25.0000 mg | ORAL_TABLET | Freq: Once | ORAL | Status: AC
  Administered 2018-11-20: 25 mg via ORAL
  Filled 2018-11-20: qty 2

## 2018-11-20 NOTE — ED Triage Notes (Signed)
Patient has several complaints, states he woke up with dizziness today, also states he has had diarrhea for a year, requesting pain management, requesting referral to gastroenterologist

## 2018-11-20 NOTE — ED Provider Notes (Signed)
[facility] EMERGENCY DEPARTMENT Provider Note   CSN: [id number] Arrival date & time: 11/20/18  1306     History   Chief Complaint Chief Complaint  Patient presents with  . Dizziness  . Diarrhea    HPI Manuel Kramer is a 63 y.o. male.  Patient complains of some dizziness.  The history is provided by the patient. No language interpreter was used.  Dizziness  Quality:  Head spinning Severity:  Mild Onset quality:  Sudden Timing:  Constant Progression:  Waxing and waning Chronicity:  New Context: not when bending over   Relieved by:  Nothing Worsened by:  Nothing Ineffective treatments:  None tried Associated symptoms: diarrhea   Associated symptoms: no chest pain and no headaches   Diarrhea  Associated symptoms: no abdominal pain and no headaches     Past Medical History:  Diagnosis Date  . Chronic diarrhea   . Diabetes mellitus without complication (HCC)   . Hypertension     Patient Active Problem List   Diagnosis Date Noted  . Hyperlipidemia 05/22/2016  . Diabetic neuropathy (HCC) 05/22/2016  . Other specified diabetes mellitus without complications (HCC) 10/02/2014  . Essential hypertension 10/02/2014  . Smoking 10/02/2014  . Family history of prostate cancer 10/02/2014  . Renal insufficiency 10/02/2014    Past Surgical History:  Procedure Laterality Date  . COLOSCOPY      POLYP        Home Medications    Prior to Admission medications   Medication Sig Start Date End Date Taking? Authorizing Provider  acetaminophen (TYLENOL) 500 MG tablet Take 1 tablet (500 mg total) by mouth every 6 (six) hours as needed. Patient taking differently: Take 500 mg by mouth every 6 (six) hours as needed for mild pain or moderate pain.  08/22/16  Yes Upstill, Melvenia Beam, PA-C  amLODipine (NORVASC) 10 MG tablet Take 1 tablet (10 mg total) by mouth daily. 01/05/17  Yes Hoy Register, MD  cloNIDine (CATAPRES) 0.1 MG tablet Take 1 tablet (0.1 mg total) by mouth 2 (two)  times daily. 09/14/16  Yes Newlin, Odette Horns, MD  Empagliflozin-linaGLIPtin (GLYXAMBI PO) Take 1 tablet by mouth daily.   Yes [provider]  Insulin Glargine (LANTUS SOLOSTAR) 100 UNIT/ML Solostar Pen Inject 30-64 Units into the skin at bedtime.  11/23/17  Yes [provider]  lisinopril (PRINIVIL,ZESTRIL) 40 MG tablet Take 1 tablet (40 mg total) by mouth daily. 01/04/17  Yes Hoy Register, MD  meclizine (ANTIVERT) 25 MG tablet Take 1 tablet (25 mg total) by mouth 3 (three) times daily as needed for dizziness. 11/20/18   Bethann Berkshire, MD    Family History Family History  Problem Relation Age of Onset  . Cancer Mother   . Diabetes Mother   . Hypertension Mother   . Cancer Father     Social History Social History   Tobacco Use  . Smoking status: Current Every Day Smoker    Packs/day: 0.50    Years: 40.00    Pack years: 20.00    Types: Cigarettes  . Smokeless tobacco: Former Engineer, water Use Topics  . Alcohol use: Not Currently    Alcohol/week: 1.0 standard drinks    Types: 1 Cans of beer per week    Comment: no etoh in 4-5 months  . Drug use: No     Allergies   Patient has no known allergies.   Review of Systems Review of Systems  Constitutional: Negative for appetite change and fatigue.  HENT: Negative for  congestion, ear discharge and sinus pressure.   Eyes: Negative for discharge.  Respiratory: Negative for cough.   Cardiovascular: Negative for chest pain.  Gastrointestinal: Positive for diarrhea. Negative for abdominal pain.  Genitourinary: Negative for frequency and hematuria.  Musculoskeletal: Negative for back pain.  Skin: Negative for rash.  Neurological: Positive for dizziness. Negative for seizures and headaches.  Psychiatric/Behavioral: Negative for hallucinations.     Physical Exam Updated Vital Signs BP (!) 169/99   Pulse 85   Temp (!) 97.3 F (36.3 C)   Resp 20   Ht 5' (1.524 m)   Wt 60.8 kg   BMI 26.17 kg/m    Physical Exam Vitals signs and nursing note reviewed.  Constitutional:      Appearance: He is well-developed.  HENT:     Head: Normocephalic.     Nose: Nose normal.  Eyes:     General: No scleral icterus.    Conjunctiva/sclera: Conjunctivae normal.  Neck:     Musculoskeletal: Neck supple.     Thyroid: No thyromegaly.  Cardiovascular:     Rate and Rhythm: Normal rate and regular rhythm.     Heart sounds: No murmur. No friction rub. No gallop.   Pulmonary:     Breath sounds: No stridor. No wheezing or rales.  Chest:     Chest wall: No tenderness.  Abdominal:     General: There is no distension.     Tenderness: There is no abdominal tenderness. There is no rebound.  Musculoskeletal: Normal range of motion.  Lymphadenopathy:     Cervical: No cervical adenopathy.  Skin:    Findings: No erythema or rash.  Neurological:     Mental Status: He is alert and oriented to person, place, and time.     Motor: No abnormal muscle tone.     Coordination: Coordination normal.  Psychiatric:        Behavior: Behavior normal.      ED Treatments / Results  Labs (all labs ordered are listed, but only abnormal results are displayed) Labs Reviewed  CBC WITH DIFFERENTIAL/PLATELET - Abnormal; Notable for the following components:      Result Value   RBC 4.08 (*)    Hemoglobin 12.7 (*)    All other components within normal limits  COMPREHENSIVE METABOLIC PANEL - Abnormal; Notable for the following components:   Potassium 3.3 (*)    Glucose, Bld 228 (*)    All other components within normal limits  CBG MONITORING, ED - Abnormal; Notable for the following components:   Glucose-Capillary 196 (*)    All other components within normal limits    EKG None  Radiology No results found.  Procedures Procedures (including critical care time)  Medications Ordered in ED Medications  amLODipine (NORVASC) tablet 10 mg (has no administration in time range)  lisinopril (PRINIVIL,ZESTRIL)  tablet 40 mg (has no administration in time range)  sodium chloride 0.9 % bolus 1,000 mL (0 mLs Intravenous Stopped 11/20/18 2321)  meclizine (ANTIVERT) tablet 25 mg (25 mg Oral Given 11/20/18 2131)     Initial Impression / Assessment and Plan / ED Course  I have reviewed the triage vital signs and the nursing notes.  Pertinent labs & imaging results that were available during my care of the patient were reviewed by me and considered in my medical decision making (see chart for details).     Abs unremarkable.  Patient has not been taking all of his blood pressure medicines and his blood pressure is elevated.  I have instructed that he needs to take his blood pressure medicine that could help with his dizziness was also given Antivert for vertigo symptoms  Final Clinical Impressions(s) / ED Diagnoses   Final diagnoses:  Dizziness  Essential hypertension    ED Discharge Orders         Ordered    meclizine (ANTIVERT) 25 MG tablet  3 times daily PRN     11/20/18 2321           Bethann Berkshire, MD 11/20/18 2325

## 2018-11-20 NOTE — ED Notes (Signed)
Attempted iv twice with no success. Pt was given meal tray.

## 2018-11-20 NOTE — Discharge Instructions (Signed)
Follow-up with Dr. Darrick Penna for your stomach problems and follow-up at the [facility] clinic for primary care.  Take your blood pressure medicines

## 2018-11-26 ENCOUNTER — Encounter: Payer: Self-pay | Admitting: Gastroenterology

## 2018-11-27 ENCOUNTER — Encounter: Payer: Self-pay | Admitting: Nurse Practitioner

## 2019-01-29 ENCOUNTER — Ambulatory Visit: Payer: Medicaid Other | Admitting: Nurse Practitioner

## 2019-02-06 ENCOUNTER — Other Ambulatory Visit: Payer: Self-pay

## 2019-02-06 ENCOUNTER — Ambulatory Visit: Payer: Medicaid Other | Admitting: Nurse Practitioner

## 2019-02-10 ENCOUNTER — Encounter: Payer: Self-pay | Admitting: Gastroenterology

## 2021-11-14 ENCOUNTER — Inpatient Hospital Stay (HOSPITAL_COMMUNITY): Payer: Medicaid Other

## 2021-11-14 ENCOUNTER — Emergency Department (HOSPITAL_COMMUNITY): Payer: Medicaid Other

## 2021-11-14 ENCOUNTER — Inpatient Hospital Stay (HOSPITAL_COMMUNITY)
Admission: EM | Admit: 2021-11-14 | Discharge: 2021-12-05 | DRG: 064 | Disposition: A | Discharge status: Skilled Nursing Facility | Payer: Medicaid Other | Attending: Internal Medicine | Admitting: Internal Medicine

## 2021-11-14 DIAGNOSIS — N179 Acute kidney failure, unspecified: Secondary | ICD-10-CM | POA: Diagnosis present

## 2021-11-14 DIAGNOSIS — E1100 Type 2 diabetes mellitus with hyperosmolarity without nonketotic hyperglycemic-hyperosmolar coma (NKHHC): Secondary | ICD-10-CM | POA: Diagnosis present

## 2021-11-14 DIAGNOSIS — J9601 Acute respiratory failure with hypoxia: Secondary | ICD-10-CM | POA: Diagnosis present

## 2021-11-14 DIAGNOSIS — E1140 Type 2 diabetes mellitus with diabetic neuropathy, unspecified: Secondary | ICD-10-CM | POA: Diagnosis present

## 2021-11-14 DIAGNOSIS — I6340 Cerebral infarction due to embolism of unspecified cerebral artery: Secondary | ICD-10-CM | POA: Insufficient documentation

## 2021-11-14 DIAGNOSIS — I6381 Other cerebral infarction due to occlusion or stenosis of small artery: Secondary | ICD-10-CM | POA: Diagnosis present

## 2021-11-14 DIAGNOSIS — G9341 Metabolic encephalopathy: Secondary | ICD-10-CM | POA: Diagnosis present

## 2021-11-14 DIAGNOSIS — D631 Anemia in chronic kidney disease: Secondary | ICD-10-CM | POA: Diagnosis present

## 2021-11-14 DIAGNOSIS — Z79899 Other long term (current) drug therapy: Secondary | ICD-10-CM

## 2021-11-14 DIAGNOSIS — E861 Hypovolemia: Secondary | ICD-10-CM | POA: Diagnosis present

## 2021-11-14 DIAGNOSIS — E43 Unspecified severe protein-calorie malnutrition: Secondary | ICD-10-CM | POA: Diagnosis present

## 2021-11-14 DIAGNOSIS — E876 Hypokalemia: Secondary | ICD-10-CM | POA: Diagnosis present

## 2021-11-14 DIAGNOSIS — G4700 Insomnia, unspecified: Secondary | ICD-10-CM | POA: Diagnosis not present

## 2021-11-14 DIAGNOSIS — I161 Hypertensive emergency: Secondary | ICD-10-CM | POA: Insufficient documentation

## 2021-11-14 DIAGNOSIS — A419 Sepsis, unspecified organism: Secondary | ICD-10-CM | POA: Insufficient documentation

## 2021-11-14 DIAGNOSIS — E873 Alkalosis: Secondary | ICD-10-CM | POA: Diagnosis present

## 2021-11-14 DIAGNOSIS — F17210 Nicotine dependence, cigarettes, uncomplicated: Secondary | ICD-10-CM | POA: Diagnosis present

## 2021-11-14 DIAGNOSIS — Z794 Long term (current) use of insulin: Secondary | ICD-10-CM

## 2021-11-14 DIAGNOSIS — G928 Other toxic encephalopathy: Secondary | ICD-10-CM

## 2021-11-14 DIAGNOSIS — E860 Dehydration: Secondary | ICD-10-CM | POA: Diagnosis present

## 2021-11-14 DIAGNOSIS — R29810 Facial weakness: Secondary | ICD-10-CM | POA: Diagnosis present

## 2021-11-14 DIAGNOSIS — R739 Hyperglycemia, unspecified: Secondary | ICD-10-CM | POA: Diagnosis present

## 2021-11-14 DIAGNOSIS — K529 Noninfective gastroenteritis and colitis, unspecified: Secondary | ICD-10-CM | POA: Diagnosis present

## 2021-11-14 DIAGNOSIS — E785 Hyperlipidemia, unspecified: Secondary | ICD-10-CM | POA: Diagnosis present

## 2021-11-14 DIAGNOSIS — G9340 Encephalopathy, unspecified: Secondary | ICD-10-CM | POA: Diagnosis present

## 2021-11-14 DIAGNOSIS — I6310 Cerebral infarction due to embolism of unspecified precerebral artery: Secondary | ICD-10-CM

## 2021-11-14 DIAGNOSIS — E1122 Type 2 diabetes mellitus with diabetic chronic kidney disease: Secondary | ICD-10-CM | POA: Diagnosis present

## 2021-11-14 DIAGNOSIS — G4720 Circadian rhythm sleep disorder, unspecified type: Secondary | ICD-10-CM | POA: Diagnosis not present

## 2021-11-14 DIAGNOSIS — R197 Diarrhea, unspecified: Secondary | ICD-10-CM

## 2021-11-14 DIAGNOSIS — I6389 Other cerebral infarction: Secondary | ICD-10-CM | POA: Diagnosis not present

## 2021-11-14 DIAGNOSIS — R6521 Severe sepsis with septic shock: Secondary | ICD-10-CM | POA: Diagnosis not present

## 2021-11-14 DIAGNOSIS — I129 Hypertensive chronic kidney disease with stage 1 through stage 4 chronic kidney disease, or unspecified chronic kidney disease: Secondary | ICD-10-CM | POA: Diagnosis present

## 2021-11-14 DIAGNOSIS — F05 Delirium due to known physiological condition: Secondary | ICD-10-CM | POA: Diagnosis not present

## 2021-11-14 DIAGNOSIS — Z716 Tobacco abuse counseling: Secondary | ICD-10-CM

## 2021-11-14 DIAGNOSIS — R1310 Dysphagia, unspecified: Secondary | ICD-10-CM | POA: Diagnosis present

## 2021-11-14 DIAGNOSIS — Z833 Family history of diabetes mellitus: Secondary | ICD-10-CM

## 2021-11-14 DIAGNOSIS — Z20822 Contact with and (suspected) exposure to covid-19: Secondary | ICD-10-CM | POA: Diagnosis present

## 2021-11-14 DIAGNOSIS — R339 Retention of urine, unspecified: Secondary | ICD-10-CM | POA: Diagnosis not present

## 2021-11-14 DIAGNOSIS — Z8249 Family history of ischemic heart disease and other diseases of the circulatory system: Secondary | ICD-10-CM

## 2021-11-14 DIAGNOSIS — R188 Other ascites: Secondary | ICD-10-CM

## 2021-11-14 DIAGNOSIS — Z7984 Long term (current) use of oral hypoglycemic drugs: Secondary | ICD-10-CM

## 2021-11-14 DIAGNOSIS — N1832 Chronic kidney disease, stage 3b: Secondary | ICD-10-CM | POA: Diagnosis present

## 2021-11-14 DIAGNOSIS — Z6821 Body mass index (BMI) 21.0-21.9, adult: Secondary | ICD-10-CM

## 2021-11-14 DIAGNOSIS — E11649 Type 2 diabetes mellitus with hypoglycemia without coma: Secondary | ICD-10-CM | POA: Diagnosis not present

## 2021-11-14 DIAGNOSIS — I6330 Cerebral infarction due to thrombosis of unspecified cerebral artery: Secondary | ICD-10-CM | POA: Diagnosis not present

## 2021-11-14 DIAGNOSIS — R0600 Dyspnea, unspecified: Secondary | ICD-10-CM

## 2021-11-14 LAB — CBC WITH DIFFERENTIAL/PLATELET
Abs Immature Granulocytes: 0.05 10*3/uL (ref 0.00–0.07)
Basophils Absolute: 0 10*3/uL (ref 0.0–0.1)
Basophils Relative: 0 %
Eosinophils Absolute: 0 10*3/uL (ref 0.0–0.5)
Eosinophils Relative: 0 %
HCT: 41.1 % (ref 39.0–52.0)
Hemoglobin: 13.8 g/dL (ref 13.0–17.0)
Immature Granulocytes: 0 %
Lymphocytes Relative: 8 %
Lymphs Abs: 1 10*3/uL (ref 0.7–4.0)
MCH: 30.4 pg (ref 26.0–34.0)
MCHC: 33.6 g/dL (ref 30.0–36.0)
MCV: 90.5 fL (ref 80.0–100.0)
Monocytes Absolute: 0.5 10*3/uL (ref 0.1–1.0)
Monocytes Relative: 4 %
Neutro Abs: 10.8 10*3/uL — ABNORMAL HIGH (ref 1.7–7.7)
Neutrophils Relative %: 88 %
Platelets: 214 10*3/uL (ref 150–400)
RBC: 4.54 MIL/uL (ref 4.22–5.81)
RDW: 11.9 % (ref 11.5–15.5)
WBC: 12.3 10*3/uL — ABNORMAL HIGH (ref 4.0–10.5)
nRBC: 0 % (ref 0.0–0.2)

## 2021-11-14 LAB — CBG MONITORING, ED
Glucose-Capillary: 459 mg/dL — ABNORMAL HIGH (ref 70–99)
Glucose-Capillary: 415 mg/dL — ABNORMAL HIGH (ref 70–99)
Glucose-Capillary: 275 mg/dL — ABNORMAL HIGH (ref 70–99)
Glucose-Capillary: 199 mg/dL — ABNORMAL HIGH (ref 70–99)
Glucose-Capillary: 141 mg/dL — ABNORMAL HIGH (ref 70–99)

## 2021-11-14 LAB — RESP PANEL BY RT-PCR (FLU A&B, COVID) ARPGX2
Influenza A by PCR: NEGATIVE
Influenza B by PCR: NEGATIVE
SARS Coronavirus 2 by RT PCR: NEGATIVE

## 2021-11-14 LAB — CSF CELL COUNT WITH DIFFERENTIAL
Eosinophils, CSF: 0 % (ref 0–1)
Lymphs, CSF: 6 % — ABNORMAL LOW (ref 40–80)
Monocyte-Macrophage-Spinal Fluid: 0 % — ABNORMAL LOW (ref 15–45)
RBC Count, CSF: 3 /mm3 — ABNORMAL HIGH
Segmented Neutrophils-CSF: 94 % — ABNORMAL HIGH (ref 0–6)
Tube #: 3
WBC, CSF: 16 /mm3 (ref 0–5)

## 2021-11-14 LAB — URINALYSIS, ROUTINE W REFLEX MICROSCOPIC
Bilirubin Urine: NEGATIVE
Glucose, UA: >=500 mg/dL — AB
Ketones, ur: NEGATIVE mg/dL
Leukocytes,Ua: NEGATIVE
Nitrite: NEGATIVE
Protein, ur: >300 mg/dL — AB
Specific Gravity, Urine: 1.01 (ref 1.005–1.030)
pH: 7 (ref 5.0–8.0)

## 2021-11-14 LAB — LACTIC ACID, PLASMA
Lactic Acid, Venous: 4.6 mmol/L (ref 0.5–1.9)
Lactic Acid, Venous: 4.7 mmol/L (ref 0.5–1.9)

## 2021-11-14 LAB — HEMOGLOBIN A1C
Hgb A1c MFr Bld: 9.9 % — ABNORMAL HIGH (ref 4.8–5.6)
Mean Plasma Glucose: 237.43 mg/dL

## 2021-11-14 LAB — I-STAT VENOUS BLOOD GAS, ED
Acid-Base Excess: 5 mmol/L — ABNORMAL HIGH (ref 0.0–2.0)
Bicarbonate: 29.1 mmol/L — ABNORMAL HIGH (ref 20.0–28.0)
Calcium, Ion: 1.07 mmol/L — ABNORMAL LOW (ref 1.15–1.40)
HCT: 42 % (ref 39.0–52.0)
Hemoglobin: 14.3 g/dL (ref 13.0–17.0)
O2 Saturation: 84 %
Potassium: 3.8 mmol/L (ref 3.5–5.1)
Sodium: 133 mmol/L — ABNORMAL LOW (ref 135–145)
TCO2: 30 mmol/L (ref 22–32)
pCO2, Ven: 39.6 mmHg — ABNORMAL LOW (ref 44.0–60.0)
pH, Ven: 7.474 — ABNORMAL HIGH (ref 7.250–7.430)
pO2, Ven: 45 mmHg (ref 32.0–45.0)

## 2021-11-14 LAB — I-STAT CHEM 8, ED
BUN: 16 mg/dL (ref 8–23)
Calcium, Ion: 1.08 mmol/L — ABNORMAL LOW (ref 1.15–1.40)
Chloride: 96 mmol/L — ABNORMAL LOW (ref 98–111)
Creatinine, Ser: 1.4 mg/dL — ABNORMAL HIGH (ref 0.61–1.24)
Glucose, Bld: 531 mg/dL (ref 70–99)
HCT: 44 % (ref 39.0–52.0)
Hemoglobin: 15 g/dL (ref 13.0–17.0)
Potassium: 3.8 mmol/L (ref 3.5–5.1)
Sodium: 134 mmol/L — ABNORMAL LOW (ref 135–145)
TCO2: 28 mmol/L (ref 22–32)

## 2021-11-14 LAB — COMPREHENSIVE METABOLIC PANEL
ALT: 23 U/L (ref 0–44)
AST: 22 U/L (ref 15–41)
Albumin: 3 g/dL — ABNORMAL LOW (ref 3.5–5.0)
Alkaline Phosphatase: 97 U/L (ref 38–126)
Anion gap: 11 (ref 5–15)
BUN: 14 mg/dL (ref 8–23)
CO2: 24 mmol/L (ref 22–32)
Calcium: 8.6 mg/dL — ABNORMAL LOW (ref 8.9–10.3)
Chloride: 97 mmol/L — ABNORMAL LOW (ref 98–111)
Creatinine, Ser: 1.68 mg/dL — ABNORMAL HIGH (ref 0.61–1.24)
GFR, Estimated: 45 mL/min — ABNORMAL LOW (ref >60)
Glucose, Bld: 533 mg/dL (ref 70–99)
Potassium: 4 mmol/L (ref 3.5–5.1)
Sodium: 132 mmol/L — ABNORMAL LOW (ref 135–145)
Total Bilirubin: 1 mg/dL (ref 0.3–1.2)
Total Protein: 5.9 g/dL — ABNORMAL LOW (ref 6.5–8.1)

## 2021-11-14 LAB — BASIC METABOLIC PANEL
Anion gap: 13 (ref 5–15)
BUN: 14 mg/dL (ref 8–23)
CO2: 21 mmol/L — ABNORMAL LOW (ref 22–32)
Calcium: 8.4 mg/dL — ABNORMAL LOW (ref 8.9–10.3)
Chloride: 102 mmol/L (ref 98–111)
Creatinine, Ser: 1.83 mg/dL — ABNORMAL HIGH (ref 0.61–1.24)
GFR, Estimated: 41 mL/min — ABNORMAL LOW (ref >60)
Glucose, Bld: 183 mg/dL — ABNORMAL HIGH (ref 70–99)
Potassium: 2.8 mmol/L — ABNORMAL LOW (ref 3.5–5.1)
Sodium: 136 mmol/L (ref 135–145)

## 2021-11-14 LAB — GLUCOSE, CAPILLARY
Glucose-Capillary: 100 mg/dL — ABNORMAL HIGH (ref 70–99)
Glucose-Capillary: 111 mg/dL — ABNORMAL HIGH (ref 70–99)
Glucose-Capillary: 166 mg/dL — ABNORMAL HIGH (ref 70–99)
Glucose-Capillary: 176 mg/dL — ABNORMAL HIGH (ref 70–99)
Glucose-Capillary: 195 mg/dL — ABNORMAL HIGH (ref 70–99)
Glucose-Capillary: 78 mg/dL (ref 70–99)

## 2021-11-14 LAB — PROTIME-INR
INR: 0.8 (ref 0.8–1.2)
Prothrombin Time: 11.2 seconds — ABNORMAL LOW (ref 11.4–15.2)

## 2021-11-14 LAB — RAPID URINE DRUG SCREEN, HOSP PERFORMED
Amphetamines: NOT DETECTED
Barbiturates: NOT DETECTED
Benzodiazepines: POSITIVE — AB
Cocaine: NOT DETECTED
Opiates: NOT DETECTED
Tetrahydrocannabinol: NOT DETECTED

## 2021-11-14 LAB — PROTEIN AND GLUCOSE, CSF
Glucose, CSF: 136 mg/dL — ABNORMAL HIGH (ref 40–70)
Total  Protein, CSF: 44 mg/dL (ref 15–45)

## 2021-11-14 LAB — APTT: aPTT: <20 seconds — ABNORMAL LOW (ref 24–36)

## 2021-11-14 LAB — HIV ANTIBODY (ROUTINE TESTING W REFLEX): HIV Screen 4th Generation wRfx: NONREACTIVE

## 2021-11-14 LAB — MRSA NEXT GEN BY PCR, NASAL: MRSA by PCR Next Gen: NOT DETECTED

## 2021-11-14 LAB — ETHANOL: Alcohol, Ethyl (B): <10 mg/dL (ref <10)

## 2021-11-14 LAB — URINALYSIS, MICROSCOPIC (REFLEX)

## 2021-11-14 LAB — AMMONIA: Ammonia: 51 umol/L — ABNORMAL HIGH (ref 9–35)

## 2021-11-14 LAB — TROPONIN I (HIGH SENSITIVITY)
Troponin I (High Sensitivity): 30 ng/L — ABNORMAL HIGH (ref <18)
Troponin I (High Sensitivity): 41 ng/L — ABNORMAL HIGH (ref <18)

## 2021-11-14 LAB — BETA-HYDROXYBUTYRIC ACID: Beta-Hydroxybutyric Acid: 1.08 mmol/L — ABNORMAL HIGH (ref 0.05–0.27)

## 2021-11-14 IMAGING — DX DG CHEST 1V PORT
1 series · 1 of 1 positions shown · non-contrast
Comparison: [DATE]

CLINICAL DATA: Intubation.  Questionable sepsis

EXAM:
PORTABLE CHEST 1 VIEW

[chest ap]
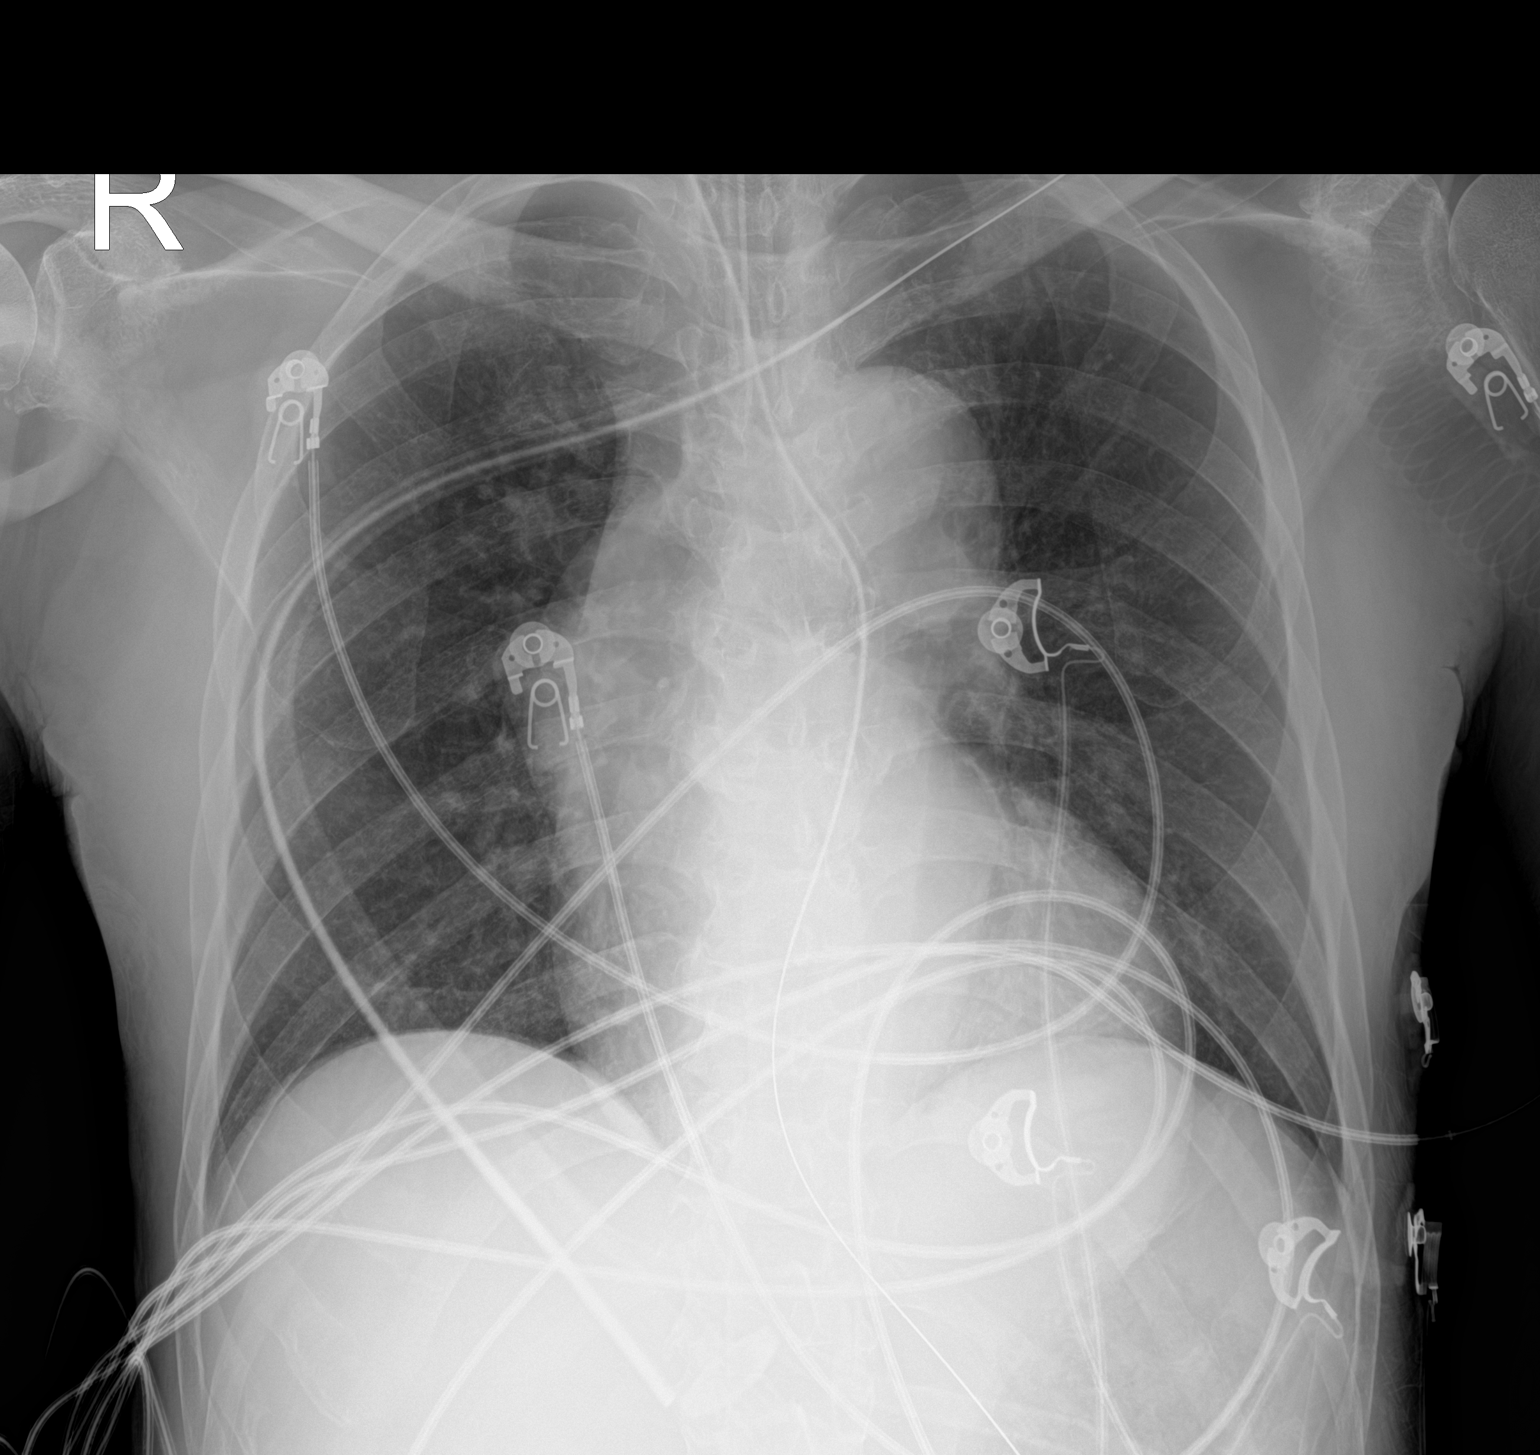

[1 of 1 positions shown; findings below may reference images not displayed]

FINDINGS: Endotracheal tube with tip just below the clavicular heads. The
enteric tube reaches the stomach. Extensive artifact from EKG leads.
Cardiomegaly and aortic tortuosity. There is no edema,
consolidation, effusion, or pneumothorax.
IMPRESSION: Unremarkable hardware.

No acute finding.

## 2021-11-14 MED ORDER — SODIUM CHLORIDE 0.9 % IV SOLN
2.0000 g | Freq: Once | INTRAVENOUS | Status: AC
  Administered 2021-11-14: 2 g via INTRAVENOUS
  Filled 2021-11-14: qty 2

## 2021-11-14 MED ORDER — FENTANYL CITRATE PF 50 MCG/ML IJ SOSY
PREFILLED_SYRINGE | INTRAMUSCULAR | Status: AC
  Filled 2021-11-14: qty 2

## 2021-11-14 MED ORDER — AMLODIPINE BESYLATE 10 MG PO TABS: 10.0000 mg | ORAL_TABLET | Freq: Every day | ORAL | Status: DC

## 2021-11-14 MED ORDER — KETAMINE HCL 50 MG/5ML IJ SOSY
PREFILLED_SYRINGE | INTRAMUSCULAR | Status: AC
  Filled 2021-11-14: qty 5

## 2021-11-14 MED ORDER — ROCURONIUM BROMIDE 50 MG/5ML IV SOLN
INTRAVENOUS | Status: DC | PRN
Start: 2021-11-14 — End: 2021-11-14
  Administered 2021-11-14: 70 mg via INTRAVENOUS

## 2021-11-14 MED ORDER — MIDAZOLAM HCL 2 MG/2ML IJ SOLN
INTRAMUSCULAR | Status: AC
  Filled 2021-11-14: qty 2

## 2021-11-14 MED ORDER — IOHEXOL 350 MG/ML SOLN
80.0000 mL | Freq: Once | INTRAVENOUS | Status: AC | PRN
  Administered 2021-11-14: 80 mL via INTRAVENOUS

## 2021-11-14 MED ORDER — CHLORHEXIDINE GLUCONATE 0.12% ORAL RINSE (MEDLINE KIT)
15.0000 mL | Freq: Two times a day (BID) | OROMUCOSAL | Status: DC
  Administered 2021-11-14 – 2021-11-24 (×17): 15 mL via OROMUCOSAL

## 2021-11-14 MED ORDER — ACETAMINOPHEN 160 MG/5ML PO SOLN
650.0000 mg | ORAL | Status: AC | PRN
  Administered 2021-11-14: 650 mg
  Filled 2021-11-14: qty 20.3

## 2021-11-14 MED ORDER — CLONIDINE HCL 0.1 MG PO TABS
0.1000 mg | ORAL_TABLET | Freq: Two times a day (BID) | ORAL | Status: DC
  Administered 2021-11-14 – 2021-11-15 (×3): 0.1 mg
  Filled 2021-11-14 (×4): qty 1

## 2021-11-14 MED ORDER — ETOMIDATE 2 MG/ML IV SOLN
INTRAVENOUS | Status: AC
  Filled 2021-11-14: qty 20

## 2021-11-14 MED ORDER — FENTANYL CITRATE PF 50 MCG/ML IJ SOSY: 100.0000 ug | PREFILLED_SYRINGE | INTRAMUSCULAR | Status: DC | PRN

## 2021-11-14 MED ORDER — SODIUM CHLORIDE 0.9 % IV BOLUS (SEPSIS)
1000.0000 mL | Freq: Once | INTRAVENOUS | Status: AC
  Administered 2021-11-14: 1000 mL via INTRAVENOUS

## 2021-11-14 MED ORDER — LACTATED RINGERS IV BOLUS
1000.0000 mL | Freq: Once | INTRAVENOUS | Status: AC
  Administered 2021-11-14: 1000 mL via INTRAVENOUS

## 2021-11-14 MED ORDER — SODIUM CHLORIDE 0.9 % IV SOLN: 2000.0000 mg | INTRAVENOUS | Status: DC

## 2021-11-14 MED ORDER — CLEVIDIPINE BUTYRATE 0.5 MG/ML IV EMUL
0.0000 mg/h | INTRAVENOUS | Status: DC
  Administered 2021-11-14 (×2): 4 mg/h via INTRAVENOUS
  Administered 2021-11-14: 19:00:00 8 mg/h via INTRAVENOUS
  Administered 2021-11-14: 2 mg/h via INTRAVENOUS
  Administered 2021-11-15: 6 mg/h via INTRAVENOUS
  Administered 2021-11-15: 10:00:00 12 mg/h via INTRAVENOUS
  Administered 2021-11-15: 4 mg/h via INTRAVENOUS
  Administered 2021-11-16: 14 mg/h via INTRAVENOUS
  Administered 2021-11-16: 12 mg/h via INTRAVENOUS
  Administered 2021-11-17: 2 mg/h via INTRAVENOUS
  Filled 2021-11-14 (×12): qty 50

## 2021-11-14 MED ORDER — ETOMIDATE 2 MG/ML IV SOLN
INTRAVENOUS | Status: DC | PRN
Start: 2021-11-14 — End: 2021-11-14
  Administered 2021-11-14: 20 mg via INTRAVENOUS

## 2021-11-14 MED ORDER — AMLODIPINE BESYLATE 10 MG PO TABS
10.0000 mg | ORAL_TABLET | Freq: Every day | ORAL | Status: DC
  Administered 2021-11-14 – 2021-11-17 (×4): 10 mg
  Filled 2021-11-14 (×4): qty 1

## 2021-11-14 MED ORDER — VANCOMYCIN HCL 750 MG/150ML IV SOLN
750.0000 mg | INTRAVENOUS | Status: DC
  Administered 2021-11-15 – 2021-11-16 (×2): 750 mg via INTRAVENOUS
  Filled 2021-11-14 (×3): qty 150

## 2021-11-14 MED ORDER — MIDAZOLAM-SODIUM CHLORIDE 100-0.9 MG/100ML-% IV SOLN
0.5000 mg/h | INTRAVENOUS | Status: DC
  Administered 2021-11-14: 0.5 mg/h via INTRAVENOUS
  Filled 2021-11-14: qty 100

## 2021-11-14 MED ORDER — SODIUM CHLORIDE 0.9 % IV SOLN
2.0000 g | Freq: Two times a day (BID) | INTRAVENOUS | Status: DC
  Administered 2021-11-14 – 2021-11-16 (×4): 2 g via INTRAVENOUS
  Filled 2021-11-14 (×4): qty 2

## 2021-11-14 MED ORDER — LACTATED RINGERS IV SOLN: INTRAVENOUS | Status: DC

## 2021-11-14 MED ORDER — DOCUSATE SODIUM 100 MG PO CAPS: 100.0000 mg | ORAL_CAPSULE | Freq: Two times a day (BID) | ORAL | Status: DC | PRN

## 2021-11-14 MED ORDER — ACETAMINOPHEN 160 MG/5ML PO SOLN
650.0000 mg | Freq: Once | ORAL | Status: AC
  Administered 2021-11-14: 650 mg via ORAL
  Filled 2021-11-14: qty 20.3

## 2021-11-14 MED ORDER — LEVETIRACETAM IN NACL 1000 MG/100ML IV SOLN
1000.0000 mg | INTRAVENOUS | Status: AC
  Administered 2021-11-14 (×2): 1000 mg via INTRAVENOUS
  Filled 2021-11-14 (×2): qty 100

## 2021-11-14 MED ORDER — METRONIDAZOLE 500 MG/100ML IV SOLN
500.0000 mg | Freq: Once | INTRAVENOUS | Status: AC
  Administered 2021-11-14: 500 mg via INTRAVENOUS
  Filled 2021-11-14: qty 100

## 2021-11-14 MED ORDER — MIDAZOLAM HCL 2 MG/2ML IJ SOLN
2.0000 mg | INTRAMUSCULAR | Status: DC | PRN
  Administered 2021-11-14: 2 mg via INTRAVENOUS
  Filled 2021-11-14 (×2): qty 2

## 2021-11-14 MED ORDER — ORAL CARE MOUTH RINSE
15.0000 mL | OROMUCOSAL | Status: DC
  Administered 2021-11-14 – 2021-11-17 (×31): 15 mL via OROMUCOSAL

## 2021-11-14 MED ORDER — FENTANYL CITRATE PF 50 MCG/ML IJ SOSY
100.0000 ug | PREFILLED_SYRINGE | INTRAMUSCULAR | Status: DC | PRN
  Administered 2021-11-14: 100 ug via INTRAVENOUS
  Filled 2021-11-14: qty 2

## 2021-11-14 MED ORDER — DEXTROSE IN LACTATED RINGERS 5 % IV SOLN: INTRAVENOUS | Status: DC

## 2021-11-14 MED ORDER — SODIUM CHLORIDE 0.9 % IV BOLUS
1000.0000 mL | Freq: Once | INTRAVENOUS | Status: AC
  Administered 2021-11-14: 1000 mL via INTRAVENOUS

## 2021-11-14 MED ORDER — CLONIDINE HCL 0.1 MG PO TABS: 0.1000 mg | ORAL_TABLET | Freq: Two times a day (BID) | ORAL | Status: DC

## 2021-11-14 MED ORDER — SODIUM CHLORIDE 0.9 % IV SOLN: INTRAVENOUS | Status: DC

## 2021-11-14 MED ORDER — POLYETHYLENE GLYCOL 3350 17 G PO PACK: 17.0000 g | PACK | Freq: Every day | ORAL | Status: DC | PRN

## 2021-11-14 MED ORDER — PROPOFOL 1000 MG/100ML IV EMUL
0.0000 ug/kg/min | INTRAVENOUS | Status: AC
  Administered 2021-11-14 (×2): 50 ug/kg/min via INTRAVENOUS
  Administered 2021-11-15: 16:00:00 30 ug/kg/min via INTRAVENOUS
  Administered 2021-11-15: 20 ug/kg/min via INTRAVENOUS
  Administered 2021-11-16: 10 ug/kg/min via INTRAVENOUS
  Filled 2021-11-14 (×8): qty 100

## 2021-11-14 MED ORDER — PANTOPRAZOLE SODIUM 40 MG IV SOLR
40.0000 mg | INTRAVENOUS | Status: DC
  Administered 2021-11-14 – 2021-11-15 (×2): 40 mg via INTRAVENOUS
  Filled 2021-11-14 (×3): qty 40

## 2021-11-14 MED ORDER — PROPOFOL 1000 MG/100ML IV EMUL
INTRAVENOUS | Status: AC
  Administered 2021-11-14: 15 ug/kg/min via INTRAVENOUS
  Filled 2021-11-14: qty 100

## 2021-11-14 MED ORDER — HEPARIN SODIUM (PORCINE) 5000 UNIT/ML IJ SOLN: 5000.0000 [IU] | Freq: Three times a day (TID) | INTRAMUSCULAR | Status: DC

## 2021-11-14 MED ORDER — INSULIN ASPART 100 UNIT/ML IJ SOLN
0.0000 [IU] | INTRAMUSCULAR | Status: DC
  Administered 2021-11-14 – 2021-11-15 (×3): 2 [IU] via SUBCUTANEOUS
  Administered 2021-11-15: 1 [IU] via SUBCUTANEOUS
  Administered 2021-11-16: 3 [IU] via SUBCUTANEOUS
  Administered 2021-11-16: 1 [IU] via SUBCUTANEOUS
  Administered 2021-11-16 (×3): 2 [IU] via SUBCUTANEOUS
  Administered 2021-11-16: 3 [IU] via SUBCUTANEOUS
  Administered 2021-11-17 (×4): 2 [IU] via SUBCUTANEOUS
  Administered 2021-11-18: 1 [IU] via SUBCUTANEOUS
  Administered 2021-11-18 (×4): 2 [IU] via SUBCUTANEOUS

## 2021-11-14 MED ORDER — PROPOFOL 1000 MG/100ML IV EMUL: 0.0000 ug/kg/min | INTRAVENOUS | Status: DC

## 2021-11-14 MED ORDER — CHLORHEXIDINE GLUCONATE CLOTH 2 % EX PADS
6.0000 | MEDICATED_PAD | Freq: Every day | CUTANEOUS | Status: DC
  Administered 2021-11-14 – 2021-11-22 (×9): 6 via TOPICAL

## 2021-11-14 MED ORDER — DEXTROSE 5 % IV SOLN
10.0000 mg/kg | Freq: Two times a day (BID) | INTRAVENOUS | Status: DC
  Administered 2021-11-14 – 2021-11-17 (×7): 600 mg via INTRAVENOUS
  Filled 2021-11-14 (×10): qty 12

## 2021-11-14 MED ORDER — SUCCINYLCHOLINE CHLORIDE 200 MG/10ML IV SOSY
PREFILLED_SYRINGE | INTRAVENOUS | Status: AC
  Filled 2021-11-14: qty 10

## 2021-11-14 MED ORDER — INSULIN GLARGINE-YFGN 100 UNIT/ML ~~LOC~~ SOLN
15.0000 [IU] | Freq: Every day | SUBCUTANEOUS | Status: DC
  Administered 2021-11-14: 15 [IU] via SUBCUTANEOUS
  Filled 2021-11-14 (×2): qty 0.15

## 2021-11-14 MED ORDER — INSULIN REGULAR(HUMAN) IN NACL 100-0.9 UT/100ML-% IV SOLN
INTRAVENOUS | Status: DC
  Administered 2021-11-14: 5 [IU]/h via INTRAVENOUS
  Filled 2021-11-14: qty 100

## 2021-11-14 MED ORDER — MIDAZOLAM HCL 2 MG/2ML IJ SOLN: 2.0000 mg | INTRAMUSCULAR | Status: DC | PRN

## 2021-11-14 MED ORDER — DEXTROSE 50 % IV SOLN: 0.0000 mL | INTRAVENOUS | Status: DC | PRN

## 2021-11-14 MED ORDER — ROCURONIUM BROMIDE 10 MG/ML (PF) SYRINGE
PREFILLED_SYRINGE | INTRAVENOUS | Status: AC
  Administered 2021-11-14: 20 mg
  Filled 2021-11-14: qty 10

## 2021-11-14 MED ORDER — VANCOMYCIN HCL IN DEXTROSE 1-5 GM/200ML-% IV SOLN
1000.0000 mg | Freq: Once | INTRAVENOUS | Status: AC
  Administered 2021-11-14: 1000 mg via INTRAVENOUS
  Filled 2021-11-14: qty 200

## 2021-11-14 NOTE — ED Notes (Signed)
Endotool: 5.5u/hr Recheck: 07:02

## 2021-11-14 NOTE — Procedures (Signed)
[facility]  Routine EEG Report  Patient Name:   Manuel Kramer, Manuel Kramer Date of Birth:   [birth year] Identification Number:   MRN - [id number]  Indication: Encephalopathy,  Technical Summary: A routine 20 channel electroencephalogram using the international 10-20 system of electrode placement was performed.  States       Coma  Abnormalities  Generalized Slowing: Diffuse generalized slowing Background Slowing: The background consists of 20-50 uV, low amplitude activity. Difficult to assess accurate background with level of suppression.   Activation Procedures  Hyperventilation: Not performed  Photic Stimulation: Not performed  Classification: Abnormal :  Diagnosis: 1. Diffuse low amplitude, suppressed background consistent with nonspecific encephalopathy, neuronal dysfunction.   No abnormal discharges or seizures are seen in this 22 minute recording ([id number]).   LTM EEG is now running.      Dr Lucianne Muss   [facility] [phone]  Case [id number]

## 2021-11-14 NOTE — Procedures (Signed)
Lumbar Puncture Procedure Note  Manuel Kramer  [id number]  1955-12-18  Date:11/14/21  Time:3:39 PM   Provider Performing:Kashauna Celmer R Dia Jefferys   Procedure: Lumbar Puncture [id number])  Indication(s) Rule out meningitis  Consent Risks of the procedure as well as the alternatives and risks of each were explained to the patient and/or caregiver.  Consent for the procedure was obtained and is signed in the bedside chart  Anesthesia Topical only with 1% lidocaine    Time Out Verified patient identification, verified procedure, site/side was marked, verified correct patient position, special equipment/implants available, medications/allergies/relevant history reviewed, required imaging and test results available.   Sterile Technique Maximal sterile technique including sterile barrier drape, hand hygiene, sterile gown, sterile gloves, mask, hair covering.    Procedure Description Using palpation, approximate location of L3-L4 space identified.   Lidocaine used to anesthetize skin and subcutaneous tissue overlying this area.  A 20g spinal needle was then used to access the subarachnoid space. Opening pressure: 16 cm H2O. Closing pressure:Not obtained. 24 cc CSF obtained.  Complications/Tolerance None; patient tolerated the procedure well.   EBL none   Specimen(s) CSF

## 2021-11-14 NOTE — ED Notes (Signed)
Pt transported to CT ?

## 2021-11-14 NOTE — Progress Notes (Signed)
Lab called with critical value: WBC 16 in CSF. Posey Boyer, NP notified.

## 2021-11-14 NOTE — Consult Note (Signed)
Neurology Consultation  Reason for Consult: Unresponsiveness Referring Physician: Dr. Grayce Sessions Cardama  CC: Unresponsiveness  History is obtained from: Limited history available due to unresponsiveness  HPI: ONAJE Kramer is a 66 y.o. male documented history of diabetes and hypertension, brought in by [facility] when neighbors called 911 when some sort of alarm went off at 0000000 AM.  Police were not able to gain entrance and left the scene.  911 was called again and police then made an entrance and the patient was found in bed unresponsive.  Systolic blood pressure in the 200s. GCS 3-emergently intubated. Attempted to reach daughter-number disconnected The phone number listed for son goes on voicemail-no response yet  According to EMS there was some question of gaze preference in the ED provider witnessed some gaze preference to the left side with some movement of all limbs.  Not very convincing for seizure activity but patient remained unresponsive during those episodes and had withdrawal to noxious stimulation per ED provider.  Rocuronium and etomidate used for paralytics used for intubation.  Currently on propofol.  LKW: Unclear tpa given?: no, clear last known well Premorbid modified Rankin scale (mRS): Unable to ascertain  ROS: Unable to obtain due to altered mental status.   Past Medical History:  Diagnosis Date   Chronic diarrhea    Diabetes mellitus without complication ([facility])    Hypertension    Family History  Problem Relation Age of Onset   Cancer Mother    Diabetes Mother    Hypertension Mother    Cancer Father    Social History:   reports that he has been smoking cigarettes. He has a 20.00 pack-year smoking history. He has quit using smokeless tobacco. He reports that he does not currently use alcohol after a past usage of about 1.0 standard drink per week. He reports that he does not use drugs.  Medications  Current Facility-Administered Medications:     0.9 %  sodium chloride infusion, , Intravenous, Continuous, Cardama, Grayce Sessions, MD, Last Rate: 125 mL/hr at 11/14/21 0459, New Bag at 11/14/21 0459   clevidipine (CLEVIPREX) infusion 0.5 mg/mL, 0-21 mg/hr, Intravenous, Continuous, Cardama, Grayce Sessions, MD, Last Rate: 4 mL/hr at 11/14/21 0430, 2 mg/hr at 11/14/21 0430   dextrose 5 % in lactated ringers infusion, , Intravenous, Continuous, Cardama, Grayce Sessions, MD   dextrose 50 % solution 0-50 mL, 0-50 mL, Intravenous, PRN, Cardama, Grayce Sessions, MD   fentaNYL (SUBLIMAZE) injection 100 mcg, 100 mcg, Intravenous, Q15 min PRN, Cardama, Grayce Sessions, MD, 100 mcg at 11/14/21 0506   fentaNYL (SUBLIMAZE) injection 100 mcg, 100 mcg, Intravenous, Q2H PRN, Cardama, Grayce Sessions, MD   insulin regular, human (MYXREDLIN) 100 units/ 100 mL infusion, , Intravenous, Continuous, Cardama, Grayce Sessions, MD   midazolam (VERSED) injection 2 mg, 2 mg, Intravenous, Q15 min PRN, Cardama, Grayce Sessions, MD   midazolam (VERSED) injection 2 mg, 2 mg, Intravenous, Q2H PRN, Cardama, Grayce Sessions, MD   propofol (DIPRIVAN) 1000 MG/100ML infusion, 0-50 mcg/kg/min, Intravenous, Continuous, Cardama, Grayce Sessions, MD, Last Rate: 14.4 mL/hr at 11/14/21 0449, 40 mcg/kg/min at 11/14/21 0450   rocuronium bromide 100 MG/10ML SOSY, , , ,    sodium chloride 0.9 % bolus 1,000 mL, 1,000 mL, Intravenous, Once, Cardama, Grayce Sessions, MD  Current Outpatient Medications:    acetaminophen (TYLENOL) 500 MG tablet, Take 1 tablet (500 mg total) by mouth every 6 (six) hours as needed. (Patient taking differently: Take 500 mg by mouth every 6 (six) hours as needed  for mild pain or moderate pain. ), Disp: 30 tablet, Rfl: 0   amLODipine (NORVASC) 10 MG tablet, Take 1 tablet (10 mg total) by mouth daily., Disp: 30 tablet, Rfl: 0   cloNIDine (CATAPRES) 0.1 MG tablet, Take 1 tablet (0.1 mg total) by mouth 2 (two) times daily., Disp: 60 tablet, Rfl: 0   Empagliflozin-linaGLIPtin (GLYXAMBI PO),  Take 1 tablet by mouth daily., Disp: , Rfl:    Insulin Glargine (LANTUS SOLOSTAR) 100 UNIT/ML Solostar Pen, Inject 30-64 Units into the skin at bedtime. , Disp: , Rfl:    lisinopril (PRINIVIL,ZESTRIL) 40 MG tablet, Take 1 tablet (40 mg total) by mouth daily., Disp: 30 tablet, Rfl: 0   meclizine (ANTIVERT) 25 MG tablet, Take 1 tablet (25 mg total) by mouth 3 (three) times daily as needed for dizziness., Disp: 15 tablet, Rfl: 0  Exam: Current vital signs: BP (!) 202/110    Pulse (!) 124    Resp (!) 23    Ht 5\' 6"  (1.676 m)    SpO2 100%    BMI 21.63 kg/m  Vital signs in last 24 hours: Pulse Rate:  [111-135] 124 (01/16 0504) Resp:  [16-30] 23 (01/16 0504) BP: (172-268)/(91-166) 202/110 (01/16 0504) SpO2:  [100 %] 100 % (01/16 0504) FiO2 (%):  [100 %] 100 % (01/16 0420) Extremely limited exam with recent intubation with paralytics Intubated Sedated with propofol Regular rate rhythm Chest clear Abdomen nondistended Extremities with no edema Neurological exam Intubated and sedated with propofol No spontaneous movements Does not withdraw or grimace to noxious stimulation Cranial nerve examination: Pupils are equal round reactive light, no gaze preference, oculocephalics present, has a upward beating nystagmus off-and-on that was seen during the exam, facial symmetry difficult to ascertain. Motor exam: No spontaneous movement Sensory exam: No response to noxious stimulation  Labs I have reviewed labs in epic and the results pertinent to this consultation are: CBC    Component Value Date/Time   WBC 12.3 (H) 11/14/2021 0420   RBC 4.54 11/14/2021 0420   HGB 13.8 11/14/2021 0420   HCT 41.1 11/14/2021 0420   PLT 214 11/14/2021 0420   MCV 90.5 11/14/2021 0420   MCH 30.4 11/14/2021 0420   MCHC 33.6 11/14/2021 0420   RDW 11.9 11/14/2021 0420   LYMPHSABS 1.0 11/14/2021 0420   MONOABS 0.5 11/14/2021 0420   EOSABS 0.0 11/14/2021 0420   BASOSABS 0.0 11/14/2021 0420   CMP     Component  Value Date/Time   NA 134 (L) 11/14/2021 0418   NA 133 (L) 11/14/2021 0418   K 3.8 11/14/2021 0418   K 3.8 11/14/2021 0418   CL 96 (L) 11/14/2021 0418   CO2 27 11/20/2018 2125   GLUCOSE 531 (HH) 11/14/2021 0418   BUN 16 11/14/2021 0418   CREATININE 1.40 (H) 11/14/2021 0418   CREATININE 1.10 07/01/2015 1140   CALCIUM 9.0 11/20/2018 2125   PROT 6.6 11/20/2018 2125   ALBUMIN 3.5 11/20/2018 2125   AST 23 11/20/2018 2125   ALT 43 11/20/2018 2125   ALKPHOS 80 11/20/2018 2125   BILITOT 0.5 11/20/2018 2125   GFRNONAA >60 11/20/2018 2125   GFRNONAA 74 07/01/2015 1140   GFRAA >60 11/20/2018 2125   GFRAA 85 07/01/2015 1140    Lipid Panel     Component Value Date/Time   CHOL 174 01/08/2015 1000   TRIG 102 01/08/2015 1000   HDL 44 01/08/2015 1000   CHOLHDL 4.0 01/08/2015 1000   VLDL 20 01/08/2015 1000   LDLCALC 110 (  H) 01/08/2015 1000   Imaging I have reviewed the images obtained:  CT-head-formal read pending. Possible hypodensity is in the left PCA territory with some other areas stroke versus posterior reversible encephalopathy syndrome.  Assessment:  Patient found unresponsive with unknown last known normal. Question of gaze deviation with presentation. Neurological exam on my encounter limited by use of carotid circulation. Broad differential including seizure/status epilepticus, stroke, hypertensive emergency, posterior reversible encephalopathy syndrome  Recommendations: -Stat CT angio head and CT perfusion -EEG -I would also load him with Keppra 2 g IV -Check UA, CXR, Ammonia, U tox Will follow.  ADDENDUM Stat CTA and perfusion: No ELVO, no perfusion deficit on CTP EEG pending. Technologists notified and should be able to get hooked up in the next hour or two. He has also developed a fever. Look for a source of infection. If non clear, consider LP at that time. For now broad abx coverage. D/W dr Leonette Monarch  -- Amie Portland, MD Neurologist [facility] Pager: [phone]  CRITICAL CARE ATTESTATION Performed by: Amie Portland, MD Total critical care time: 40 minutes Critical care time was exclusive of separately billable procedures and treating other patients and/or supervising APPs/Residents/Students Critical care was necessary to treat or prevent imminent or life-threatening deterioration due to possible toxic metabolic encephalopathy vs status epilepticus  This patient is critically ill and at significant risk for neurological worsening and/or death and care requires constant monitoring. Critical care was time spent personally by me on the following activities: development of treatment plan with patient and/or surrogate as well as nursing, discussions with consultants, evaluation of patient's response to treatment, examination of patient, obtaining history from patient or surrogate, ordering and performing treatments and interventions, ordering and review of laboratory studies, ordering and review of radiographic studies, pulse oximetry, re-evaluation of patient's condition, participation in multidisciplinary rounds and medical decision making of high complexity in the care of this patient.

## 2021-11-14 NOTE — ED Notes (Signed)
Endotool: 5u/hr Recheck: 5:40

## 2021-11-14 NOTE — ED Provider Notes (Signed)
[facility] EMERGENCY DEPARTMENT Provider Note  CSN: [id number] Arrival date & time: 11/14/21 0359  Chief Complaint(s) Altered Mental Status Pt arrived via [facility] for cc of AMS. Per EMS neighbors called 911 for medical alarm going off 1:30 am, police were not able to gain entrance and left scene. 911 was called again and police made entrance, found patient in bed.    20g L Bicep    200/100 120 397 99% RA HPI Manuel Kramer is a 66 y.o. male brought in by EMS after being found unresponsive at home this morning by [employer].  EMS reported patient was inconherently combative with rightward eye deviation. Moving all extremities. Hypertensive. Elevated cbg. No meds given.  Remainder of history, ROS, and physical exam limited due to patient's condition (unresponsive). Additional information was obtained from EMS.   Level V Caveat.  Attempt to contact daughter and son unsuccessful  Altered Mental Status  Past Medical History Past Medical History:  Diagnosis Date   Chronic diarrhea    Diabetes mellitus without complication (HCC)    Hypertension    Patient Active Problem List   Diagnosis Date Noted   Hyperlipidemia 05/22/2016   Diabetic neuropathy (HCC) 05/22/2016   Other specified diabetes mellitus without complications (HCC) 10/02/2014   Essential hypertension 10/02/2014   Smoking 10/02/2014   Family history of prostate cancer 10/02/2014   Renal insufficiency 10/02/2014   Home Medication(s) Prior to Admission medications   Medication Sig Start Date End Date Taking? Authorizing Provider  acetaminophen (TYLENOL) 500 MG tablet Take 1 tablet (500 mg total) by mouth every 6 (six) hours as needed. Patient taking differently: Take 500 mg by mouth every 6 (six) hours as needed for mild pain or moderate pain.  08/22/16   Elpidio Anis, PA-C  amLODipine (NORVASC) 10 MG tablet Take 1 tablet (10 mg total) by mouth daily. 01/05/17   Hoy Register, MD  cloNIDine  (CATAPRES) 0.1 MG tablet Take 1 tablet (0.1 mg total) by mouth 2 (two) times daily. 09/14/16   Hoy Register, MD  Empagliflozin-linaGLIPtin (GLYXAMBI PO) Take 1 tablet by mouth daily.    [provider]  Insulin Glargine (LANTUS SOLOSTAR) 100 UNIT/ML Solostar Pen Inject 30-64 Units into the skin at bedtime.  11/23/17   [provider]  lisinopril (PRINIVIL,ZESTRIL) 40 MG tablet Take 1 tablet (40 mg total) by mouth daily. 01/04/17   Hoy Register, MD  meclizine (ANTIVERT) 25 MG tablet Take 1 tablet (25 mg total) by mouth 3 (three) times daily as needed for dizziness. 11/20/18   Bethann Berkshire, MD                                                                                                                                    Allergies Patient has no known allergies.  Review of Systems Review of Systems As noted in HPI  Physical Exam Vital Signs  I have reviewed the triage  vital signs BP (!) 253/133    Pulse (!) 118    Temp (!) 101.4 F (38.6 C) (Oral)    Resp 17    Ht 5\' 6"  (1.676 m)    SpO2 99%    BMI 21.63 kg/m   Physical Exam Vitals reviewed.  Constitutional:      General: He is in acute distress.     Appearance: He is well-developed. He is ill-appearing.     Comments: Fecal matter all over lower extremities  HENT:     Head: Normocephalic and atraumatic.     Nose: Nose normal.     Mouth/Throat:     Mouth: Mucous membranes are dry.  Eyes:     General: No scleral icterus.       Right eye: No discharge.        Left eye: No discharge.     Conjunctiva/sclera: Conjunctivae normal.     Pupils: Pupils are equal, round, and reactive to light.  Cardiovascular:     Rate and Rhythm: Regular rhythm. Tachycardia present.     Heart sounds: No murmur heard.   No friction rub. No gallop.  Pulmonary:     Effort: Tachypnea present.     Breath sounds: Normal breath sounds. No stridor. No rales.  Abdominal:     General: There is no distension.     Palpations: Abdomen is  soft.     Tenderness: There is no abdominal tenderness.  Musculoskeletal:        General: No tenderness.     Cervical back: Normal range of motion and neck supple.  Skin:    General: Skin is warm and dry.     Findings: No erythema or rash.  Neurological:     Mental Status: He is unresponsive.     GCS: GCS eye subscore is 1. GCS verbal subscore is 1. GCS motor subscore is 4.    ED Results and Treatments Labs (all labs ordered are listed, but only abnormal results are displayed) Labs Reviewed  LACTIC ACID, PLASMA - Abnormal; Notable for the following components:      Result Value   Lactic Acid, Venous 4.6 (*)    All other components within normal limits  COMPREHENSIVE METABOLIC PANEL - Abnormal; Notable for the following components:   Sodium 132 (*)    Chloride 97 (*)    Glucose, Bld 533 (*)    Creatinine, Ser 1.68 (*)    Calcium 8.6 (*)    Total Protein 5.9 (*)    Albumin 3.0 (*)    GFR, Estimated 45 (*)    All other components within normal limits  CBC WITH DIFFERENTIAL/PLATELET - Abnormal; Notable for the following components:   WBC 12.3 (*)    Neutro Abs 10.8 (*)    All other components within normal limits  PROTIME-INR - Abnormal; Notable for the following components:   Prothrombin Time 11.2 (*)    All other components within normal limits  APTT - Abnormal; Notable for the following components:   aPTT <20 (*)    All other components within normal limits  I-STAT CHEM 8, ED - Abnormal; Notable for the following components:   Sodium 134 (*)    Chloride 96 (*)    Creatinine, Ser 1.40 (*)    Glucose, Bld 531 (*)    Calcium, Ion 1.08 (*)    All other components within normal limits  I-STAT VENOUS BLOOD GAS, ED - Abnormal; Notable for the following components:   pH, Ven  7.474 (*)    pCO2, Ven 39.6 (*)    Bicarbonate 29.1 (*)    Acid-Base Excess 5.0 (*)    Sodium 133 (*)    Calcium, Ion 1.07 (*)    All other components within normal limits  CBG MONITORING, ED -  Abnormal; Notable for the following components:   Glucose-Capillary 459 (*)    All other components within normal limits  CBG MONITORING, ED - Abnormal; Notable for the following components:   Glucose-Capillary 415 (*)    All other components within normal limits  TROPONIN I (HIGH SENSITIVITY) - Abnormal; Notable for the following components:   Troponin I (High Sensitivity) 30 (*)    All other components within normal limits  RESP PANEL BY RT-PCR (FLU A&B, COVID) ARPGX2  CULTURE, BLOOD (ROUTINE X 2)  CULTURE, BLOOD (ROUTINE X 2)  ETHANOL  LACTIC ACID, PLASMA  URINALYSIS, ROUTINE W REFLEX MICROSCOPIC  AMMONIA  RAPID URINE DRUG SCREEN, HOSP PERFORMED  BETA-HYDROXYBUTYRIC ACID  TROPONIN I (HIGH SENSITIVITY)                                                                                                                         EKG  EKG Interpretation  Date/Time:  Monday November 14 2021 04:22:52 EST Ventricular Rate:  114 PR Interval:  139 QRS Duration: 93 QT Interval:  320 QTC Calculation: 441 R Axis:   22 Text Interpretation: Sinus tachycardia Probable left atrial enlargement Anteroseptal infarct, old Repol abnrm suggests ischemia, anterolateral Confirmed by Drema Pry [phone]) on 11/14/2021 4:24:27 AM       Radiology CT Head Wo Contrast  Result Date: 11/14/2021 CLINICAL DATA:  Mental status change with unknown cause EXAM: CT HEAD WITHOUT CONTRAST TECHNIQUE: Contiguous axial images were obtained from the base of the skull through the vertex without intravenous contrast. RADIATION DOSE REDUCTION: This exam was performed according to the departmental dose-optimization program which includes automated exposure control, adjustment of the mA and/or kV according to patient size and/or use of iterative reconstruction technique. COMPARISON:  01/20/2018 FINDINGS: Brain: No evidence of acute infarction, hemorrhage, hydrocephalus, extra-axial collection or mass lesion/mass effect. Relative  low-density appearance at the left occipital pole is likely artifact from the inner table of the skull. Deep chronic white matter disease with progression from prior. Generalized atrophy since prior which is also progressed. Vascular: No hyperdense vessel or unexpected calcification. Skull: Normal. Negative for fracture or focal lesion. Sinuses/Orbits: No acute finding. IMPRESSION: 1. No acute finding. 2. Atrophy and chronic small vessel ischemia with significant progression from 2019. Electronically Signed   By: Tiburcio Pea M.D.   On: 11/14/2021 05:13   DG Chest Port 1 View  Result Date: 11/14/2021 CLINICAL DATA:  Intubation.  Questionable sepsis EXAM: PORTABLE CHEST 1 VIEW COMPARISON:  05/25/2020 FINDINGS: Endotracheal tube with tip just below the clavicular heads. The enteric tube reaches the stomach. Extensive artifact from EKG leads. Cardiomegaly and aortic tortuosity. There is no edema, consolidation, effusion, or pneumothorax. IMPRESSION:  Unremarkable hardware. No acute finding. Electronically Signed   By: Tiburcio Pea M.D.   On: 11/14/2021 05:11   CT ANGIO HEAD NECK W WO CM W PERF  Addendum Date: 11/14/2021   ADDENDUM REPORT: 11/14/2021 06:08 ADDENDUM: Multiphase CT imaging of the brain was performed following IV bolus contrast injection. Subsequent parametric perfusion maps were calculated using RAPID software. CT Brain Perfusion Findings: ASPECTS: 10 CBF (<30%) Volume: 4mL Perfusion (Tmax>6.0s) volume: 96mL Electronically Signed   By: Tiburcio Pea M.D.   On: 11/14/2021 06:08   Result Date: 11/14/2021 CLINICAL DATA:  Neuro deficit with stroke suspected EXAM: CT ANGIOGRAPHY HEAD AND NECK TECHNIQUE: Multidetector CT imaging of the head and neck was performed using the standard protocol during bolus administration of intravenous contrast. Multiplanar CT image reconstructions and MIPs were obtained to evaluate the vascular anatomy. Carotid stenosis measurements (when applicable) are obtained  utilizing NASCET criteria, using the distal internal carotid diameter as the denominator. RADIATION DOSE REDUCTION: This exam was performed according to the departmental dose-optimization program which includes automated exposure control, adjustment of the mA and/or kV according to patient size and/or use of iterative reconstruction technique. CONTRAST:  15mL OMNIPAQUE IOHEXOL 350 MG/ML SOLN COMPARISON:  Noncontrast head CT from earlier today FINDINGS: CTA NECK FINDINGS Aortic arch: No acute finding or dilatation. Three vessel branching. Right carotid system: Atheromatous wall thickening proximally. No flow limiting stenosis or ulceration. Left carotid system: Mild proximal atheromatous wall thickening with mild calcified plaque at the bifurcation. No flow limiting stenosis or ulceration. Vertebral arteries: No proximal subclavian stenosis. Left dominant vertebral artery. Both vertebral arteries are smoothly contoured and widely patent to the dura. Skeleton: No acute finding Other neck: Subcutaneous reticulation which appears dependent, likely third-spacing. Upper chest: No acute finding Review of the MIP images confirms the above findings CTA HEAD FINDINGS Anterior circulation: Atheromatous calcification on the carotid siphons. Right atheromatous supraclinoid ICA narrowing not reaching 50% stenosis by measurement. No branch occlusion, beading, or aneurysm. Posterior circulation: Dominant left vertebral artery. Vertebral and basilar arteries are smoothly contoured and widely patent. No branch occlusion, beading, or aneurysm. Venous sinuses: Diffusely patent as permitted by contrast timing Anatomic variants: None significant Review of the MIP images confirms the above findings IMPRESSION: 1. No emergent finding. 2. Atherosclerosis without flow limiting stenosis of major vessels in the head and neck. Electronically Signed: By: Tiburcio Pea M.D. On: 11/14/2021 06:01    Pertinent labs & imaging results that were  available during my care of the patient were reviewed by me and considered in my medical decision making (see MDM for details).  Medications Ordered in ED Medications  rocuronium bromide 100 MG/10ML SOSY (has no administration in time range)  propofol (DIPRIVAN) 1000 MG/100ML infusion (40 mcg/kg/min Intravenous Rate/Dose Change 11/14/21 0450)  clevidipine (CLEVIPREX) infusion 0.5 mg/mL (2 mg/hr Intravenous New Bag/Given 11/14/21 0430)  insulin regular, human (MYXREDLIN) 100 units/ 100 mL infusion (5 Units/hr Intravenous New Bag/Given 11/14/21 0512)  dextrose 5 % in lactated ringers infusion (0 mLs Intravenous Hold 11/14/21 0535)  dextrose 50 % solution 0-50 mL (has no administration in time range)  0.9 %  sodium chloride infusion ( Intravenous New Bag/Given 11/14/21 0459)  fentaNYL (SUBLIMAZE) injection 100 mcg (100 mcg Intravenous Given 11/14/21 0506)  fentaNYL (SUBLIMAZE) injection 100 mcg (has no administration in time range)  metroNIDAZOLE (FLAGYL) IVPB 500 mg (500 mg Intravenous New Bag/Given 11/14/21 0611)  vancomycin (VANCOCIN) IVPB 1000 mg/200 mL premix (has no administration in time range)  sodium chloride  0.9 % bolus 1,000 mL (1,000 mLs Intravenous New Bag/Given 11/14/21 0603)  levETIRAcetam (KEPPRA) IVPB 1000 mg/100 mL premix (0 mg Intravenous Stopped 11/14/21 0633)  ceFEPIme (MAXIPIME) 2 g in sodium chloride 0.9 % 100 mL IVPB (has no administration in time range)  vancomycin (VANCOREADY) IVPB 750 mg/150 mL (has no administration in time range)  midazolam (VERSED) 100 mg/100 mL (1 mg/mL) premix infusion (0.5 mg/hr Intravenous New Bag/Given 11/14/21 0636)  acetaminophen (TYLENOL) 160 MG/5ML solution 650 mg (has no administration in time range)  sodium chloride 0.9 % bolus 1,000 mL (1,000 mLs Intravenous New Bag/Given 11/14/21 0631)  ceFEPIme (MAXIPIME) 2 g in sodium chloride 0.9 % 100 mL IVPB (0 g Intravenous Stopped 11/14/21 0633)  iohexol (OMNIPAQUE) 350 MG/ML injection 80 mL (80 mLs  Intravenous Contrast Given 11/14/21 0535)                                                                                                                                     Procedures .1-3 Lead EKG Interpretation Performed by: Nira Connardama, Malosi Hemstreet Eduardo, MD Authorized by: Nira Connardama, Giana Castner Eduardo, MD     Interpretation: abnormal     ECG rate:  121   ECG rate assessment: tachycardic     Rhythm: sinus tachycardia     Ectopy: none     Conduction: normal   .Critical Care Performed by: Nira Connardama, Jerrard Bradburn Eduardo, MD Authorized by: Nira Connardama, Dhwani Venkatesh Eduardo, MD   Critical care provider statement:    Critical care time (minutes):  80   Critical care time was exclusive of:  Separately billable procedures and treating other patients   Critical care was necessary to treat or prevent imminent or life-threatening deterioration of the following conditions:  Sepsis, shock and CNS failure or compromise   Critical care was time spent personally by me on the following activities:  Development of treatment plan with patient or surrogate, discussions with consultants, evaluation of patient's response to treatment, examination of patient, obtaining history from patient or surrogate, review of old charts, re-evaluation of patient's condition, pulse oximetry, ordering and review of radiographic studies, ordering and review of laboratory studies and ordering and performing treatments and interventions   Care discussed with: admitting provider    (including critical care time)  Medical Decision Making / ED Course        Unresponsiveness Airway secured with ETT due to unresponsiveness and airway protection Presentation concerning for ICH,CVA. Seizure, hypertensive emergency, metabolic derangement, infectious source  Work-up ordered to assess concerns above. Labs and imaging Independently interpreted by me and noted below: Metabolic panel with hyperglycemia, but consistent with DKA. No significant electrolyte  derangements. Mild AKI noted, but patient is not uremic. CXR with good ETT and OGT placement. No evidence of PNA, PTx, pulmonary edema. CT negative for ICH. No obvious mass effect.  I spoke with Dr. Wilford CornerArora from Neurology who reviewed the CT as well and noted possible remote PCA stroke.  He recommended CTA with perfusion and EEG in the morning. Radiology noted no acute changes other CBC with leukocytosis. Lactic acid elevated. No obvious source of infection at this time.  No anemia Normal ammonia ETOH negative No respiratory acidosis/hypercarbia  Management: Intubation for airway protection (see APP note) Sedation with propofol gtt and PRN fentanyl and versed Cleviprex started for BP control, but will allow for some permissive hypertension for possible CVA Insulin gtt for hyperglycemia 5:23 AM noted to be febrile. With leukocytosis, lactic acidosis and AMS, Code sepsis initiated. Ordered empiric Abx and IVF (30cc/kg ) for infection (no current source).  6:28 AM: spoke with ICU attending who will arrange to have patient admitted to the hospital for further work up and management. Temp foley inserted. Temp 102. Liquid tylenol ordered  Reassessment: 5:31 AM: BP controlled with Cleviprex 6:05 AM: patient max out on propofol. Still required additional sedation. Versed gtt ordered. 6:48 AM less agitated.   Admitted to ICU   Final Clinical Impression(s) / ED Diagnoses Final diagnoses:  Sepsis with encephalopathy and septic shock, due to unspecified organism [facility])  Hypertensive emergency without congestive heart failure  Hyperglycemia  AKI (acute kidney injury) (HCC)           This chart was dictated using voice recognition software.  Despite best efforts to proofread,  errors can occur which can change the documentation meaning.    Nira Conn, MD 11/14/21 [id number]

## 2021-11-14 NOTE — Progress Notes (Signed)
Pharmacy Antibiotic Note  Manuel Kramer is a 66 y.o. male admitted on 11/14/2021 with sepsis.  Pharmacy has been consulted for Vancomycin/Cefepime dosing. WBC mildly elevated. Noted renal dysfunction. Febrile up to 101.4.   Plan: Vancomycin 750 mg IV q24h >>>Estimated AUC: 469 Cefepime 2g IV q12h Trend WBC, temp, renal function  F/U infectious work-up Drug levels as indicated   Height: 5\' 6"  (167.6 cm) IBW/kg (Calculated) : 63.8  Temp (24hrs), Avg:101.4 F (38.6 C), Min:101.4 F (38.6 C), Max:101.4 F (38.6 C)  Recent Labs  Lab 11/14/21 0418 11/14/21 0420  WBC  --  12.3*  CREATININE 1.40* 1.68*  LATICACIDVEN 4.6*  --     CrCl cannot be calculated (Unknown ideal weight.).    No Known Allergies  Narda Bonds, PharmD, BCPS Clinical Pharmacist Phone: [phone]

## 2021-11-14 NOTE — Progress Notes (Signed)
Pt transported on vent from 027 to 4N30 without any complications. RN at bedside, RT will continue to monitor.

## 2021-11-14 NOTE — Progress Notes (Signed)
Pt with continued low UOP. Posey Boyer, NP notified.

## 2021-11-14 NOTE — Progress Notes (Signed)
Moved LTM EEG  to 4N30 without incident.  Pt monitored with [facility]

## 2021-11-14 NOTE — Sepsis Progress Note (Signed)
Notified provider of need to order a third lactic acid.

## 2021-11-14 NOTE — Progress Notes (Signed)
Pharmacy Antibiotic Note  Manuel Kramer is a 66 y.o. male admitted on 11/14/2021 with sepsis.  Pharmacy has been consulted for Vancomycin/Cefepime dosing.   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 Pt s/p LP. CrCl ~35.   Plan: Acyclovir 10mg /kg q12h LR infusing at 125/h  Height: 5\' 6"  (167.6 cm) Weight: 60 kg (132 lb 4.4 oz) IBW/kg (Calculated) : 63.8  Temp (24hrs), Avg:102.3 F (39.1 C), Min:99.2 F (37.3 C), Max:103.1 F (39.5 C)  Recent Labs  Lab 11/14/21 0418 11/14/21 0420 11/14/21 0721 11/14/21 1219  WBC  --  12.3*  --   --   CREATININE 1.40* 1.68*  --  1.83*  LATICACIDVEN 4.6*  --  4.7*  --      Estimated Creatinine Clearance: 34.6 mL/min (A) (by C-G formula based on SCr of 1.83 mg/dL (H)).    No Known Allergies  Arrie Senate, PharmD, BCPS, [facility] Clinical Pharmacist [phone] Please check AMION for all [facility] numbers 11/14/2021

## 2021-11-14 NOTE — Code Documentation (Addendum)
ETT placed by Petrucelli, 25 at the lip, +color change

## 2021-11-14 NOTE — Progress Notes (Signed)
EEG done at bedside with patient on propofol and intubated. No skin breakdown noted. Results pending.

## 2021-11-14 NOTE — Progress Notes (Signed)
UOP 45 cc since arrival to unit. Dr. Judeth Horn at bedside and aware. Order received to change MIVF to LR at 125 and give 1L LR bolus. Will monitor

## 2021-11-14 NOTE — ED Triage Notes (Addendum)
Pt arrived via [facility] for cc of AMS. Per EMS neighbors called 911 for medical alarm going off 1:30 am, police were not able to gain entrance and left scene. 911 was called again and police made entrance, found patient in bed agitated and combative with EMS, but non-verbal, movements not purposeful. No medication given in route. Pt restrained to stretcher. A&Ox0. Withdrawls from pain.   20g L Bicep   BP 200/100 HR 120 CBG 397 99% RA

## 2021-11-14 NOTE — H&P (Signed)
NAME:  Manuel Kramer E Hickle, MRN:  [id number], DOB:  30-Jun-1956, LOS: 0 ADMISSION DATE:  11/14/2021, CONSULTATION DATE:  1/16 REFERRING MD:  Dr. Eudelia Bunchardama, CHIEF COMPLAINT:  encephalopathy; fever; htn emergency   History of Present Illness:  Patient is a 66 year old male with pertinent PMH DMT2, HTN [facility] ED on 1/16 after being found unresponsive.  On 1/16, EMS called to patient's house at 1:30 AM after medical alarm going off.  Police had to break into the home to get inside and upon entry, found patient in bed unresponsive.  Patient BP initially 200/100.  EMS states patient was incoherently combative with right eye deviation.  Elevated CBG.  Br[facility].  [facility], patient hypertensive, fever 101.4 F, and unresponsive.  Patient intubated for airway protection.  CT and CTA head negative. Started on cardene. Neuro consulted. Recommend starting EEG. Keppra started.   Lactic acid 4.6.  VBG 7.47, 39, 45, 29.  NA 132.  Glucose 533.  Creat 1.68.  WBC 12.3.  Troponin 30.  Ethanol and ammonia WNL.  UA clean. UDS positive for benzos though collected after RSI.   PCCM consulted for ICU admission.  Pertinent  Medical History   Past Medical History:  Diagnosis Date   Chronic diarrhea    Diabetes mellitus without complication (HCC)    Hypertension      Significant Hospital Events: Including procedures, antibiotic start and stop dates in addition to other pertinent events   1/16: [facility]; unresponsive; intubated for airway protection; htn   Interim History / Subjective:  Sedated on vent. Remains hypertensive.  Objective   Blood pressure (!) 192/103, pulse (!) 116, temperature (!) 101.4 F (38.6 C), temperature source Oral, resp. rate (!) 21, height 5\' 6"  (1.676 m), SpO2 100 %.    Vent Mode: PRVC FiO2 (%):  [30 %-100 %] 30 % Set Rate:  [18 bmp] 18 bmp Vt Set:  [410 mL] 410 mL PEEP:  [5 cmH20] 5 cmH20 Plateau Pressure:  [15 cmH20] 15 cmH20  No intake or  output data in the 24 hours ending 11/14/21 0646 There were no vitals filed for this visit.  Examination: General: Adult male, resting in bed, in NAD. Neuro: Sedated, not responsive. HEENT: /AT. Sclerae anicteric. ETT in place. Cardiovascular: RRR, no M/R/G.  Lungs: Respirations even and unlabored.  CTA bilaterally, No W/R/R. Abdomen: BS x 4, soft, NT/ND.  Musculoskeletal: No gross deformities, no edema.  Skin: Intact, warm, no rashes.  Assessment & Plan:   Acute metabolic encephalopathy - unclear etiology at this point though certainly some component HHS in play.  ? Sepsis of unclear etiology thus far.  Consider PRES though no indication on CT head. - Continue supportive care. - Avoid sedative meds, have asked ED RN to wean his sedation so that we can get a better assessment of his mental status. - D/c Midazolam gtt. - Neuro following, recommending LP if no improvement. - F/u on EEG.  Acute hypoxic respiratory failure with inability to protect airway - 2/2  above. - Full vent support. - Wean once mental status improves. - Bronchial hygiene. - Follow CXR.  Sepsis - unclear etiology thus far. - Continue empiric broad spectrum abx. - Follow cultures. - Trend lactate. - Consider LP but defer for now.  HTN emergency. - Continue Cleviprex, goal SBP ~ 160 - 165. - Hold home Amlodipine, Clonidine, Lisinopril.  AKI. - Follow BMP after fluid boluses for sepsis. - Hold home Lisinopril.  HHS with hx underlying DM. - Continue insulin gtt per endo tool. - Hold home Glyxambi, Lantus.  Best Practice (right click and "Reselect all SmartList Selections" daily)   Diet/type: NPO DVT prophylaxis: prophylactic heparin  GI prophylaxis: PPI Lines: N/A Foley:  N/A Code Status:  full code Last date of multidisciplinary goals of care discussion: None yet.  Labs   CBC: Recent Labs  Lab 11/14/21 0418 11/14/21 0420  WBC  --  12.3*  NEUTROABS  --  10.8*  HGB 15.0   14.3 13.8   HCT 44.0   42.0 41.1  MCV  --  90.5  PLT  --  214    Basic Metabolic Panel: Recent Labs  Lab 11/14/21 0418 11/14/21 0420  NA 134*   133* 132*  K 3.8   3.8 4.0  CL 96* 97*  CO2  --  24  GLUCOSE 531* 533*  BUN 16 14  CREATININE 1.40* 1.68*  CALCIUM  --  8.6*   GFR: CrCl cannot be calculated (Unknown ideal weight.). Recent Labs  Lab 11/14/21 0418 11/14/21 0420  WBC  --  12.3*  LATICACIDVEN 4.6*  --     Liver Function Tests: Recent Labs  Lab 11/14/21 0420  AST 22  ALT 23  ALKPHOS 97  BILITOT 1.0  PROT 5.9*  ALBUMIN 3.0*   No results for input(s): LIPASE, AMYLASE in the last 168 hours. No results for input(s): AMMONIA in the last 168 hours.  ABG    Component Value Date/Time   HCO3 29.1 (H) 11/14/2021 0418   TCO2 28 11/14/2021 0418   TCO2 30 11/14/2021 0418   O2SAT 84.0 11/14/2021 0418     Coagulation Profile: Recent Labs  Lab 11/14/21 0420  INR 0.8    Cardiac Enzymes: No results for input(s): CKTOTAL, CKMB, CKMBINDEX, TROPONINI in the last 168 hours.  HbA1C: Hemoglobin A1C  Date/Time Value Ref Range Status  05/22/2016 11:16 AM 9.8  Final  07/01/2015 11:50 AM 10.60  Final   Hgb A1c MFr Bld  Date/Time Value Ref Range Status  08/17/2007 04:12 AM   Final   5.0 (NOTE)   The ADA recommends the following therapeutic goals for glycemic   control related to Hgb A1C measurement:   Goal of Therapy:   < 7.0% Hgb A1C   Action Suggested:  > 8.0% Hgb A1C   Ref:  Diabetes Care, 22, Suppl. 1, 1999    CBG: Recent Labs  Lab 11/14/21 0504 11/14/21 0623  GLUCAP 459* 415*    Review of Systems:   Unable to obtain; patient intubated.  Past Medical History:  He,  has a past medical history of Chronic diarrhea, Diabetes mellitus without complication (HCC), and Hypertension.   Surgical History:   Past Surgical History:  Procedure Laterality Date   COLOSCOPY      POLYP     Social History:   reports that he has been smoking cigarettes. He has a 20.00  pack-year smoking history. He has quit using smokeless tobacco. He reports that he does not currently use alcohol after a past usage of about 1.0 standard drink per week. He reports that he does not use drugs.   Family History:  His family history includes Cancer in his father and mother; Diabetes in his mother; Hypertension in his mother.   Allergies No Known Allergies   Home Medications  Prior to Admission medications   Medication Sig Start Date End Date Taking? Authorizing Provider  acetaminophen (TYLENOL) 500 MG tablet Take 1  tablet (500 mg total) by mouth every 6 (six) hours as needed. Patient taking differently: Take 500 mg by mouth every 6 (six) hours as needed for mild pain or moderate pain.  08/22/16   Elpidio Anis, PA-C  amLODipine (NORVASC) 10 MG tablet Take 1 tablet (10 mg total) by mouth daily. 01/05/17   Hoy Register, MD  cloNIDine (CATAPRES) 0.1 MG tablet Take 1 tablet (0.1 mg total) by mouth 2 (two) times daily. 09/14/16   Hoy Register, MD  Empagliflozin-linaGLIPtin (GLYXAMBI PO) Take 1 tablet by mouth daily.    [provider]  Insulin Glargine (LANTUS SOLOSTAR) 100 UNIT/ML Solostar Pen Inject 30-64 Units into the skin at bedtime.  11/23/17   [provider]  lisinopril (PRINIVIL,ZESTRIL) 40 MG tablet Take 1 tablet (40 mg total) by mouth daily. 01/04/17   Hoy Register, MD  meclizine (ANTIVERT) 25 MG tablet Take 1 tablet (25 mg total) by mouth 3 (three) times daily as needed for dizziness. 11/20/18   Bethann Berkshire, MD     Critical care time: 40 min.   Rutherford Guys, PA - C [facility] For pager details, please see AMION or use Epic chat  After 1900, please call [facility] for cross coverage needs 11/14/2021, 8:37 AM

## 2021-11-14 NOTE — ED Provider Notes (Signed)
Procedure performed w/ attending Dr. Azzie Roup @ bedside Procedures  Procedure Name: Intubation Date/Time: 11/14/2021 4:13 AM Performed by: Amaryllis Dyke, PA-C Pre-anesthesia Checklist: Patient identified, Patient being monitored, Emergency Drugs available and Suction available Oxygen Delivery Method: Ambu bag Preoxygenation: Pre-oxygenation with 100% oxygen Induction Type: Rapid sequence Ventilation: Mask ventilation without difficulty Laryngoscope Size: Glidescope and 3 Grade View: Grade I Tube size: 7.5 mm Number of attempts: 1 Airway Equipment and Method: Rigid stylet Placement Confirmation: ETT inserted through vocal cords under direct vision, CO2 detector and Breath sounds checked- equal and bilateral Secured at: 25 (@ the teeth) cm Dental Injury: Teeth and Oropharynx as per pre-operative assessment       Amaryllis Dyke, PA-C 11/14/21 0422    Fatima Blank, MD 11/14/21 [phone]

## 2021-11-14 NOTE — Progress Notes (Signed)
Pt being followed by ELink for Sepsis protocol. 

## 2021-11-15 ENCOUNTER — Inpatient Hospital Stay (HOSPITAL_COMMUNITY): Payer: Medicaid Other

## 2021-11-15 DIAGNOSIS — G9340 Encephalopathy, unspecified: Secondary | ICD-10-CM

## 2021-11-15 LAB — POCT I-STAT 7, (LYTES, BLD GAS, ICA,H+H)
Acid-Base Excess: 1 mmol/L (ref 0.0–2.0)
Acid-Base Excess: 0 mmol/L (ref 0.0–2.0)
Bicarbonate: 22.8 mmol/L (ref 20.0–28.0)
Bicarbonate: 23.9 mmol/L (ref 20.0–28.0)
Calcium, Ion: 1.21 mmol/L (ref 1.15–1.40)
Calcium, Ion: 1.22 mmol/L (ref 1.15–1.40)
HCT: 30 % — ABNORMAL LOW (ref 39.0–52.0)
HCT: 35 % — ABNORMAL LOW (ref 39.0–52.0)
Hemoglobin: 10.2 g/dL — ABNORMAL LOW (ref 13.0–17.0)
Hemoglobin: 11.9 g/dL — ABNORMAL LOW (ref 13.0–17.0)
O2 Saturation: 99 %
O2 Saturation: 98 %
Patient temperature: 98.2
Patient temperature: 36.7
Potassium: 2.3 mmol/L — CL (ref 3.5–5.1)
Potassium: 3.4 mmol/L — ABNORMAL LOW (ref 3.5–5.1)
Sodium: 138 mmol/L (ref 135–145)
Sodium: 137 mmol/L (ref 135–145)
TCO2: 24 mmol/L (ref 22–32)
TCO2: 25 mmol/L (ref 22–32)
pCO2 arterial: 26.2 mmHg — ABNORMAL LOW (ref 32.0–48.0)
pCO2 arterial: 34.4 mmHg (ref 32.0–48.0)
pH, Arterial: 7.547 — ABNORMAL HIGH (ref 7.350–7.450)
pH, Arterial: 7.448 (ref 7.350–7.450)
pO2, Arterial: 113 mmHg — ABNORMAL HIGH (ref 83.0–108.0)
pO2, Arterial: 106 mmHg (ref 83.0–108.0)

## 2021-11-15 LAB — CBC
HCT: 36 % — ABNORMAL LOW (ref 39.0–52.0)
Hemoglobin: 12.6 g/dL — ABNORMAL LOW (ref 13.0–17.0)
MCH: 30.7 pg (ref 26.0–34.0)
MCHC: 35 g/dL (ref 30.0–36.0)
MCV: 87.8 fL (ref 80.0–100.0)
Platelets: 215 10*3/uL (ref 150–400)
RBC: 4.1 MIL/uL — ABNORMAL LOW (ref 4.22–5.81)
RDW: 12.1 % (ref 11.5–15.5)
WBC: 14.7 10*3/uL — ABNORMAL HIGH (ref 4.0–10.5)
nRBC: 0 % (ref 0.0–0.2)

## 2021-11-15 LAB — BASIC METABOLIC PANEL
Anion gap: 13 (ref 5–15)
BUN: 17 mg/dL (ref 8–23)
CO2: 23 mmol/L (ref 22–32)
Calcium: 8.7 mg/dL — ABNORMAL LOW (ref 8.9–10.3)
Chloride: 104 mmol/L (ref 98–111)
Creatinine, Ser: 2.05 mg/dL — ABNORMAL HIGH (ref 0.61–1.24)
GFR, Estimated: 36 mL/min — ABNORMAL LOW (ref >60)
Glucose, Bld: 111 mg/dL — ABNORMAL HIGH (ref 70–99)
Potassium: 3 mmol/L — ABNORMAL LOW (ref 3.5–5.1)
Sodium: 140 mmol/L (ref 135–145)

## 2021-11-15 LAB — GLUCOSE, CAPILLARY
Glucose-Capillary: 119 mg/dL — ABNORMAL HIGH (ref 70–99)
Glucose-Capillary: 135 mg/dL — ABNORMAL HIGH (ref 70–99)
Glucose-Capillary: 169 mg/dL — ABNORMAL HIGH (ref 70–99)
Glucose-Capillary: 172 mg/dL — ABNORMAL HIGH (ref 70–99)
Glucose-Capillary: 182 mg/dL — ABNORMAL HIGH (ref 70–99)
Glucose-Capillary: 78 mg/dL (ref 70–99)

## 2021-11-15 LAB — PATHOLOGIST SMEAR REVIEW

## 2021-11-15 LAB — SODIUM, URINE, RANDOM: Sodium, Ur: <10 mmol/L

## 2021-11-15 LAB — CREATININE, URINE, RANDOM: Creatinine, Urine: 207.54 mg/dL

## 2021-11-15 LAB — PHOSPHORUS: Phosphorus: 3 mg/dL (ref 2.5–4.6)

## 2021-11-15 LAB — TRIGLYCERIDES: Triglycerides: 219 mg/dL — ABNORMAL HIGH (ref <150)

## 2021-11-15 LAB — MAGNESIUM: Magnesium: 1.8 mg/dL (ref 1.7–2.4)

## 2021-11-15 IMAGING — DX DG CHEST 1V PORT
1 series · 1 of 1 positions shown · non-contrast
Comparison: [DATE]

CLINICAL DATA: Respiratory failure.

EXAM:
PORTABLE CHEST 1 VIEW

[chest]
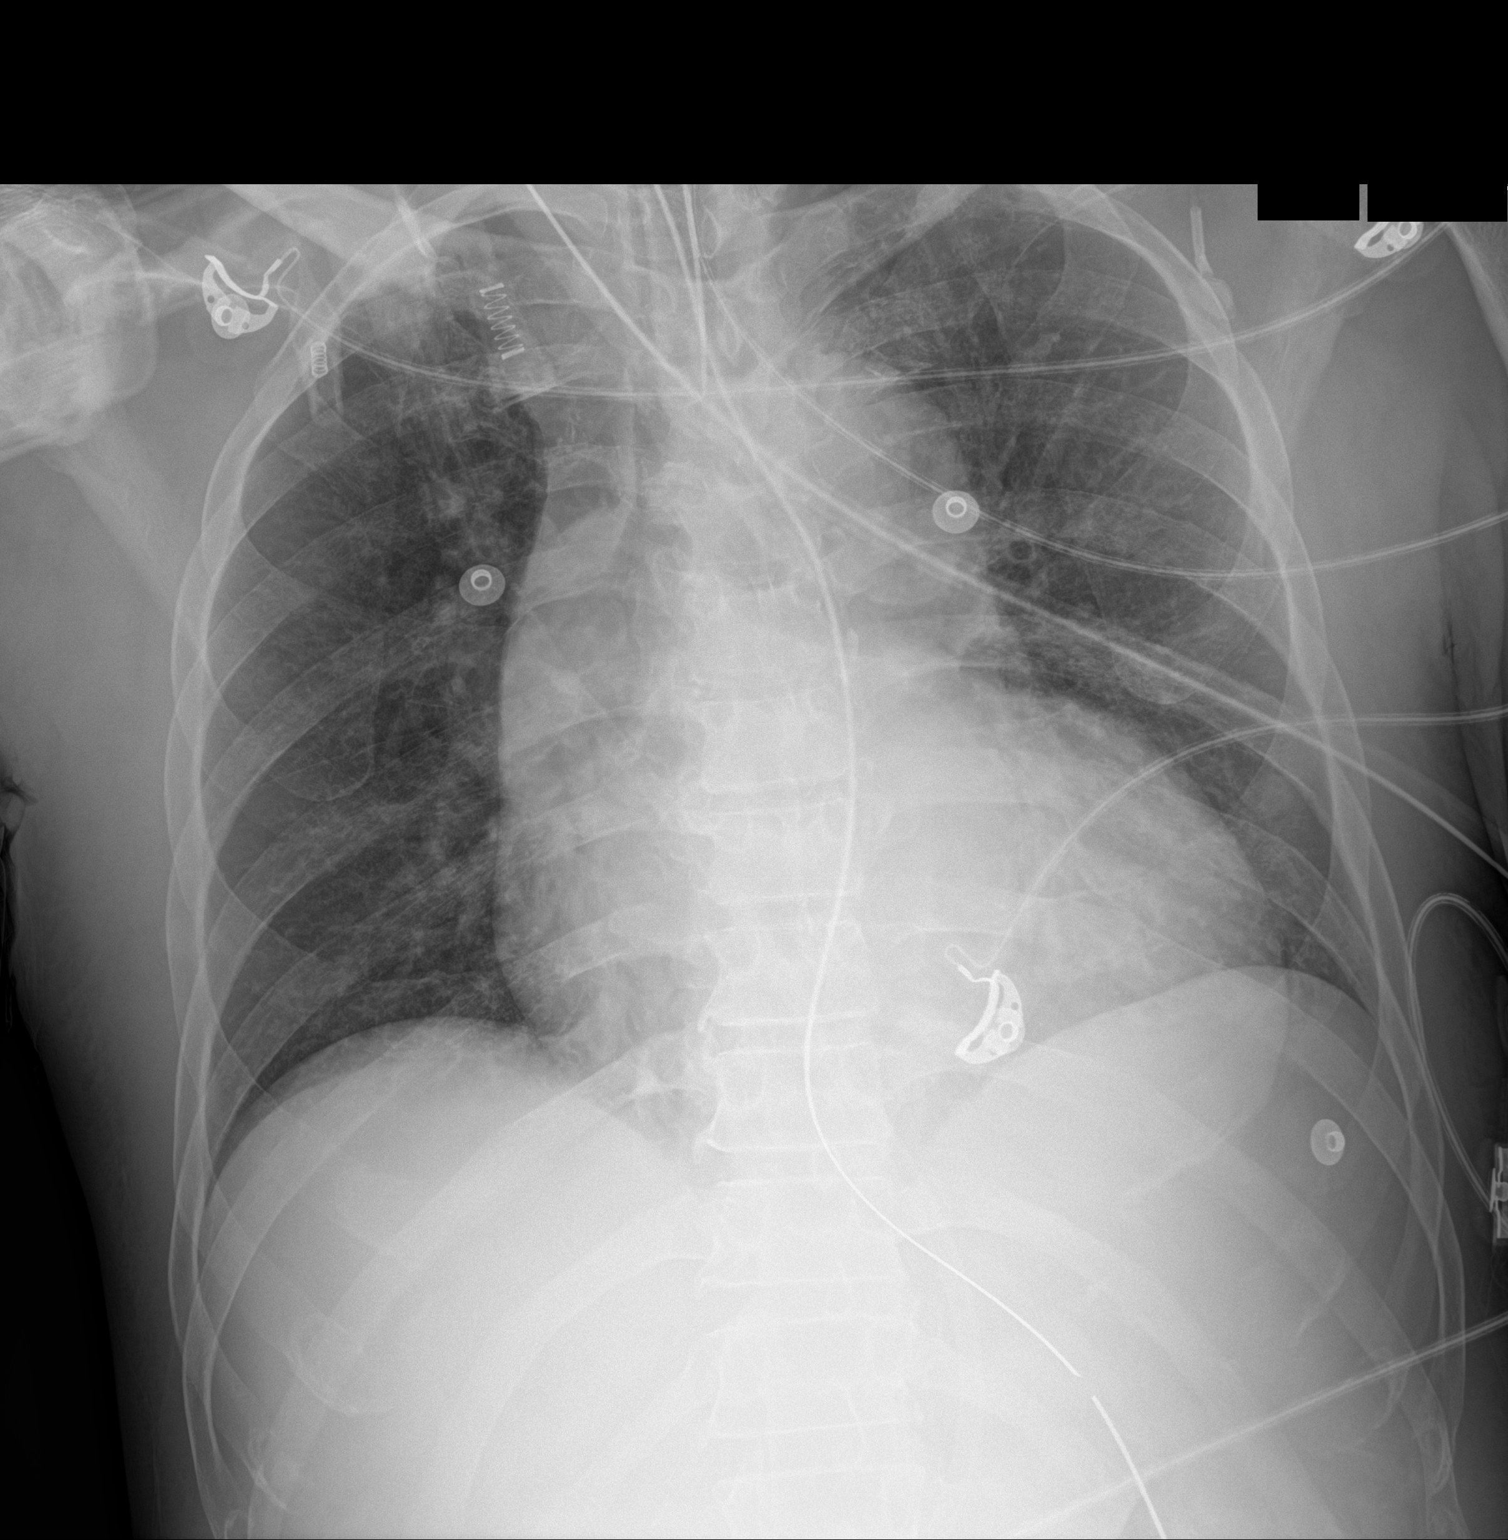

[1 of 1 positions shown; findings below may reference images not displayed]

FINDINGS: ET tube tip is above the carina. There is a nasogastric tube with
tip and side port below the level of the GE junction. Heart size the
cardiomediastinal contours appear stable. No pleural effusion or
edema.
IMPRESSION: 1. Satisfactory position of support apparatus.
2. No acute cardiopulmonary abnormalities.

## 2021-11-15 IMAGING — CT CT HEAD W/O CM
4 series · 15 of 47 positions shown, 17 images · non-contrast
Comparison: CT brain [DATE] and [DATE]

CLINICAL DATA: Mental status change. Unknown cause. Right-sided
weakness. Downward gaze.



[Series 3: head wo · axial · 0.42mm/px · z∈[-161,-41]mm · 7 of 33 slices shown, 9 images]
[im 5/33  brain]
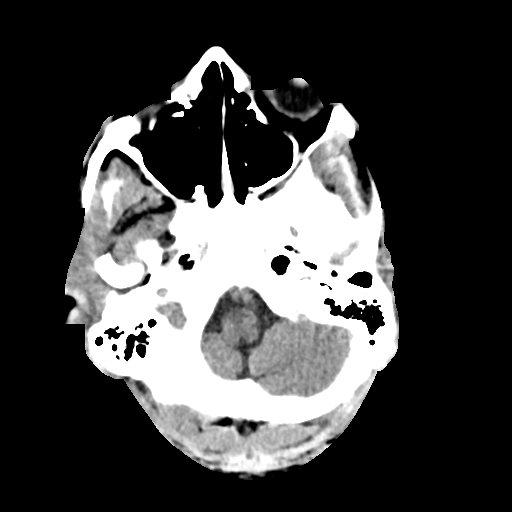
[im 5/33  bone]
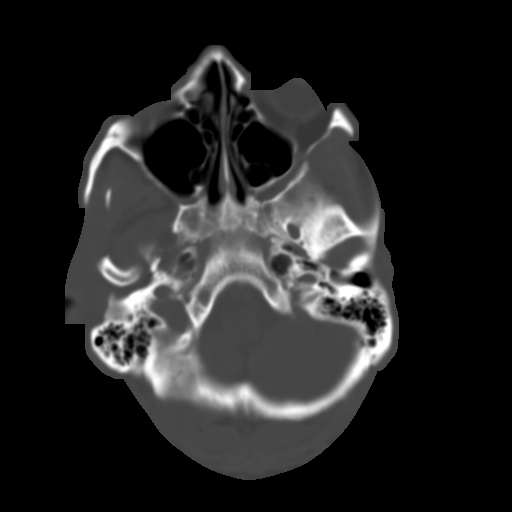
[im 9/33  brain]
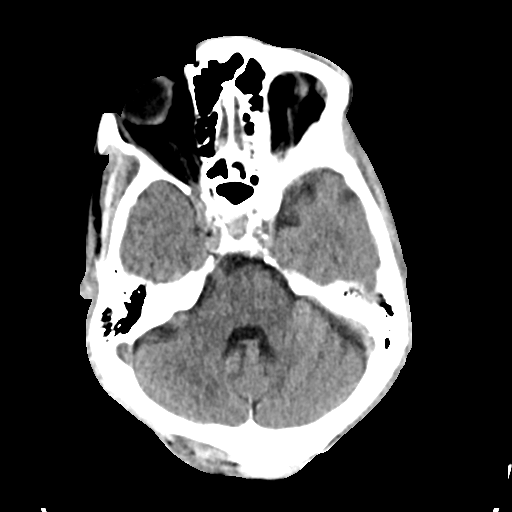
[im 13/33  brain]
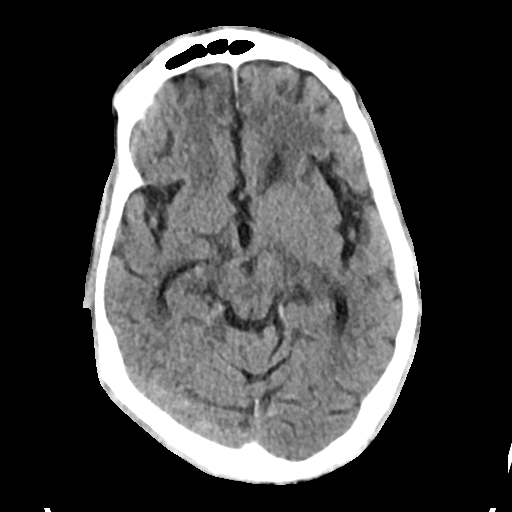
[im 17/33  brain]
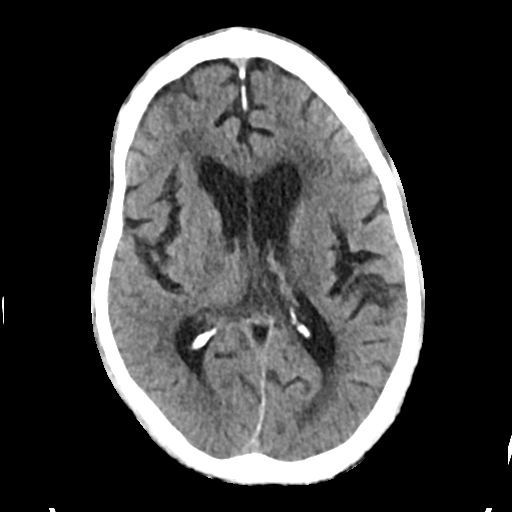
[im 21/33  brain]
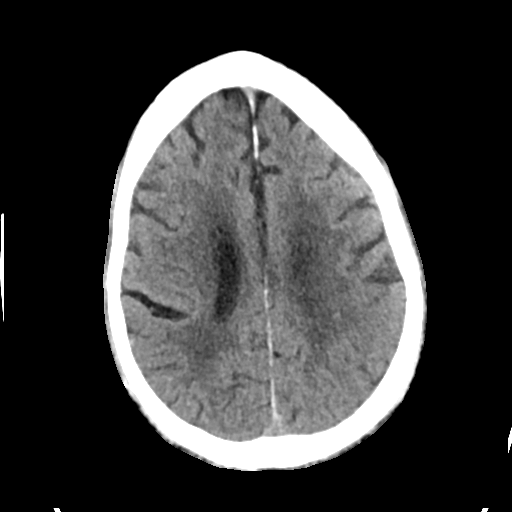
[im 21/33  bone]
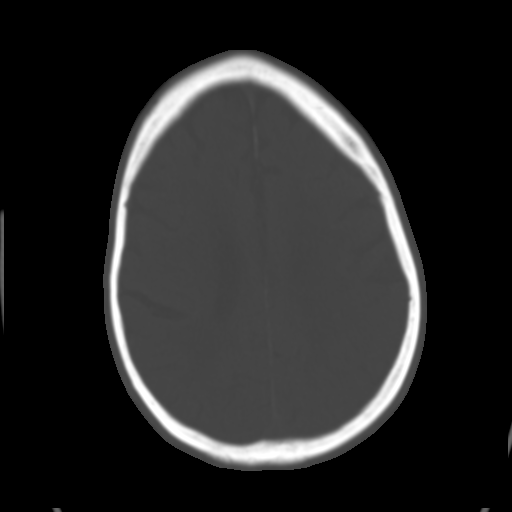
[im 25/33  brain]
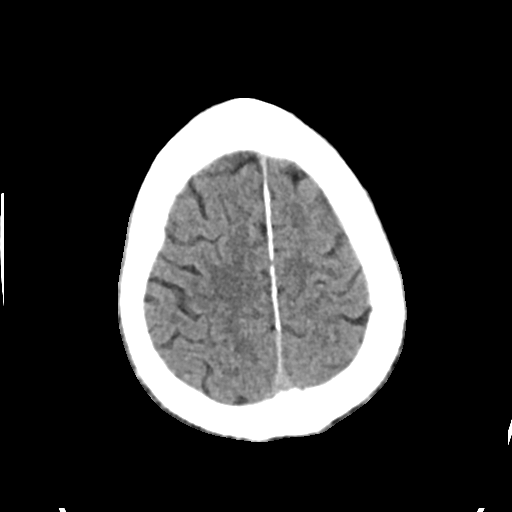
[im 29/33  brain]
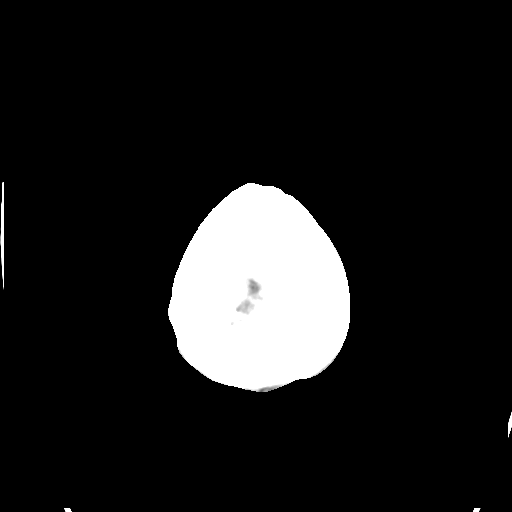

[Series 4: head bone · axial · 0.34mm/px · z∈[-173,-157]mm · 2 of 81 slices shown]
[im 9/81  bone]
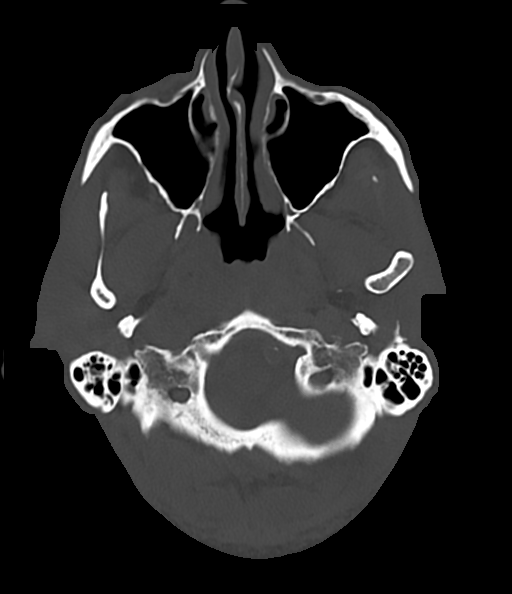
[im 17/81  bone]
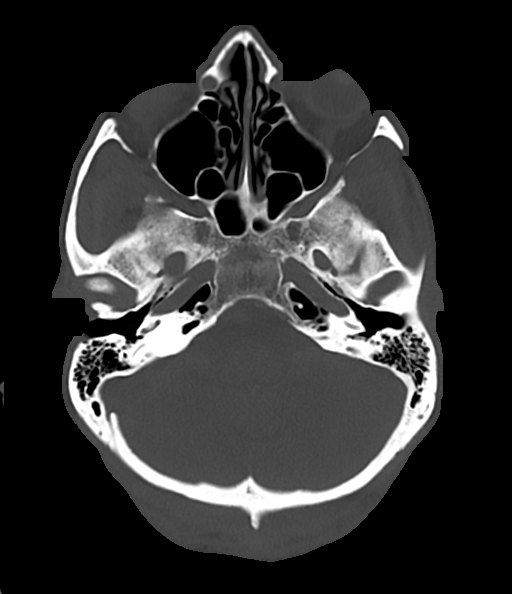

[Series 5: cor soft · coronal · 0.31mm/px · 3 of 68 slices shown]
[im 23/68  brain]
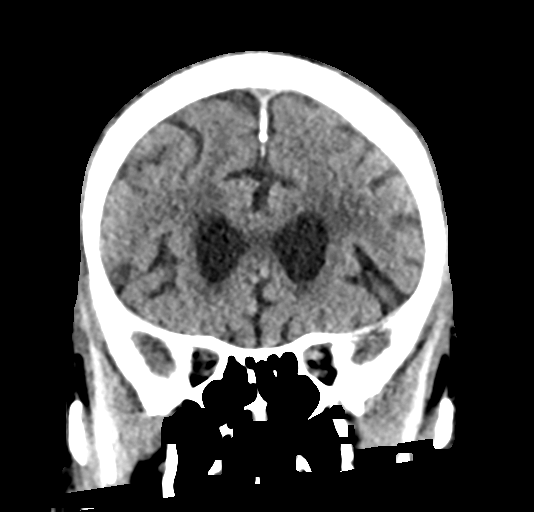
[im 30/68  brain]
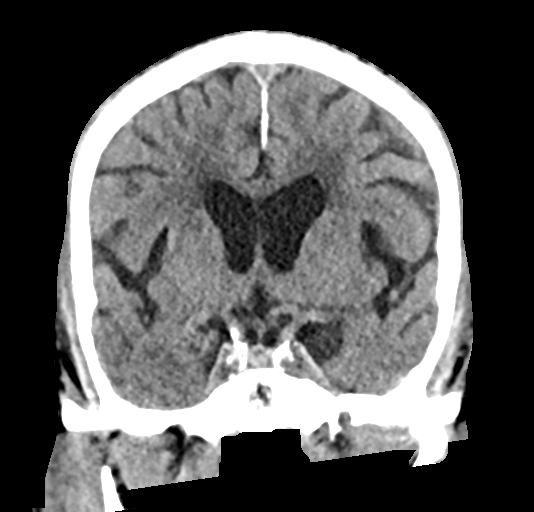
[im 38/68  brain]
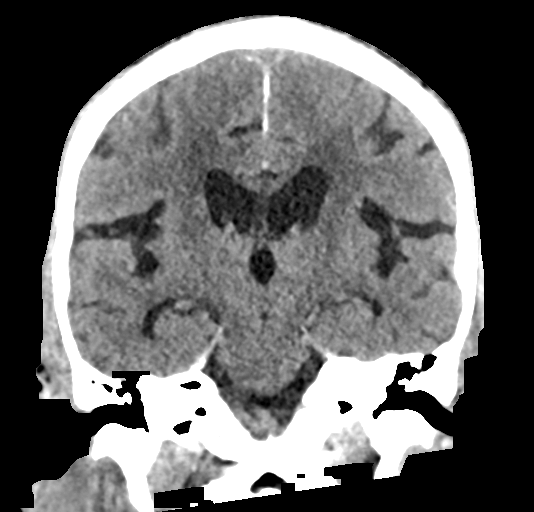

[Series 6: sag soft · sagittal · 0.31mm/px · 3 of 54 slices shown]
[im 19/54  brain]
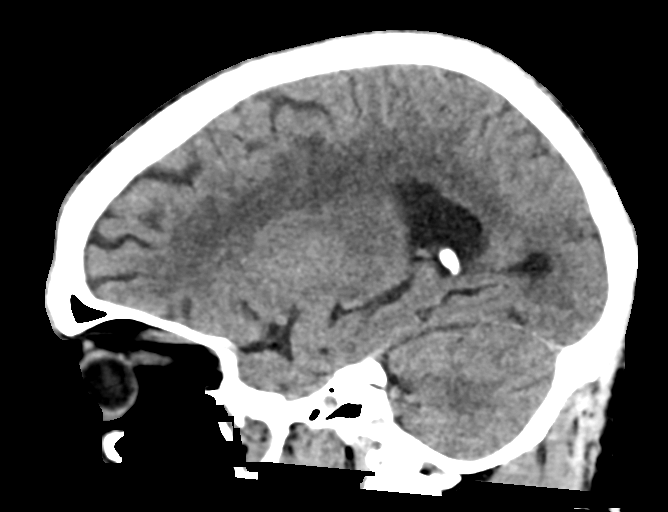
[im 26/54  brain]
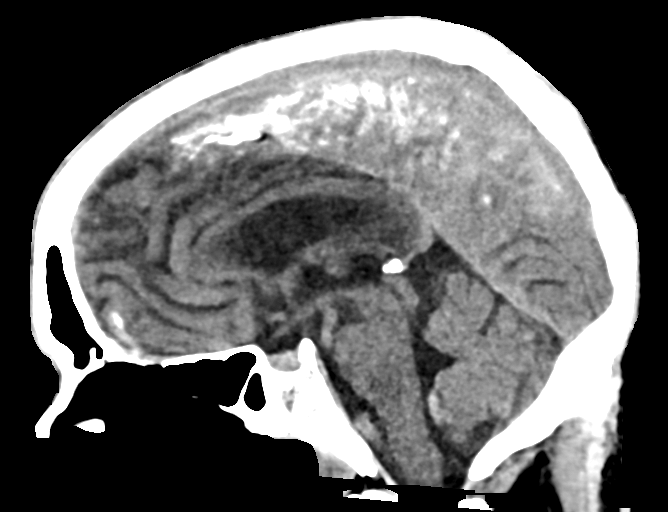
[im 34/54  brain]
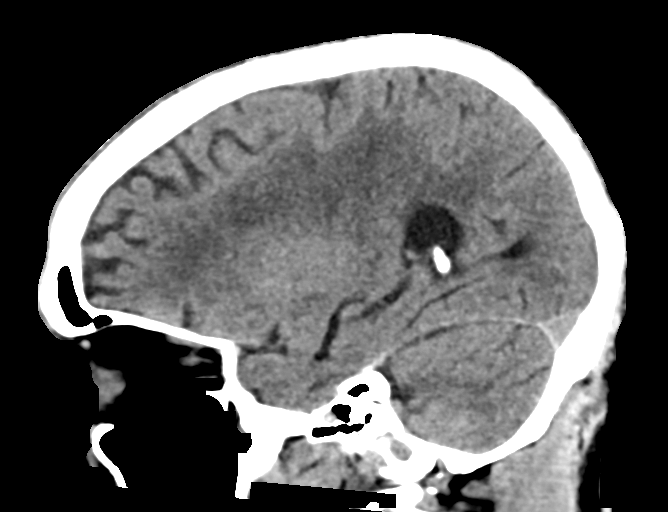

[15 of 47 positions shown; findings below may reference images not displayed]

FINDINGS: Brain: There is mild cortical atrophy, within normal limits for
patient age. The ventricles are normal in configuration. The basilar
cisterns are patent.

No mass, mass effect, or midline shift.

No acute intracranial hemorrhage is seen.

No abnormal extra-axial fluid collection.

Unchanged small hypodensity possibly a small lacunar infarct at the
superior aspect of the left thalamus. Mild-to-moderate
periventricular white matter hypodensities, likely chronic ischemic
white matter changes.

Preservation of the normal cortical gray-white interface without CT
evidence of an acute major vascular territorial cortical based
infarction.

Vascular: No hyperdense vessel or unexpected calcification.

Skull: Normal. Negative for fracture or focal lesion.

Sinuses/Orbits: The visualized orbits are unremarkable. The
visualized paranasal sinuses and mastoid air cells are clear.

Other: None.
IMPRESSION: No significant change from prior.  No acute intracranial process.

Mild cortical atrophy and mild-to-moderate chronic ischemic white
matter changes.

## 2021-11-15 IMAGING — US US RENAL
1 series · 14 of 25 positions shown · non-contrast
Comparison: [DATE]

CLINICAL DATA: Acute kidney injury.

EXAM:
RENAL / URINARY TRACT ULTRASOUND COMPLETE

[Series 1: us renal · 14 of 75 slices shown]
[im 1/75]
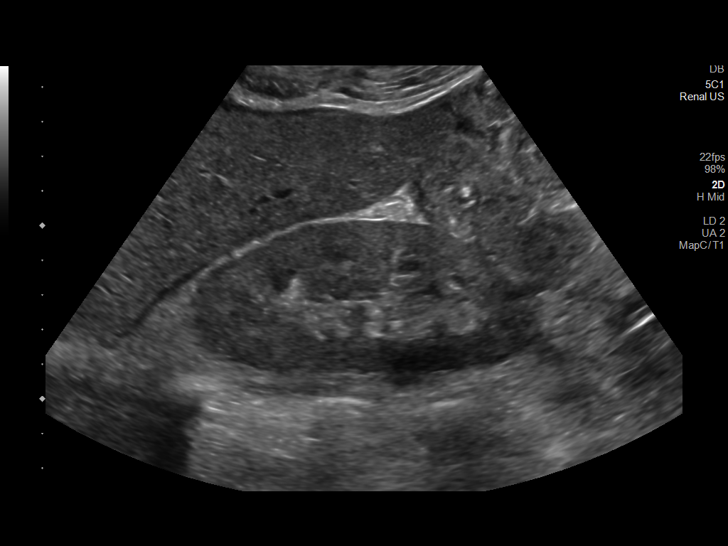
[im 7/75]
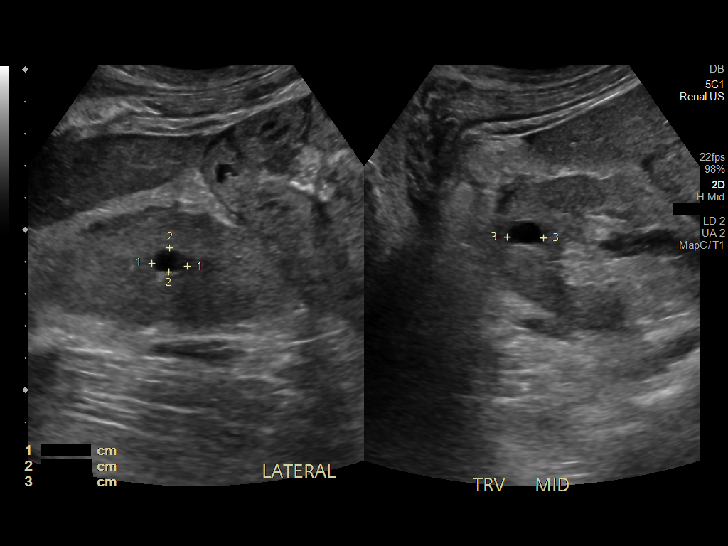
[im 13/75]
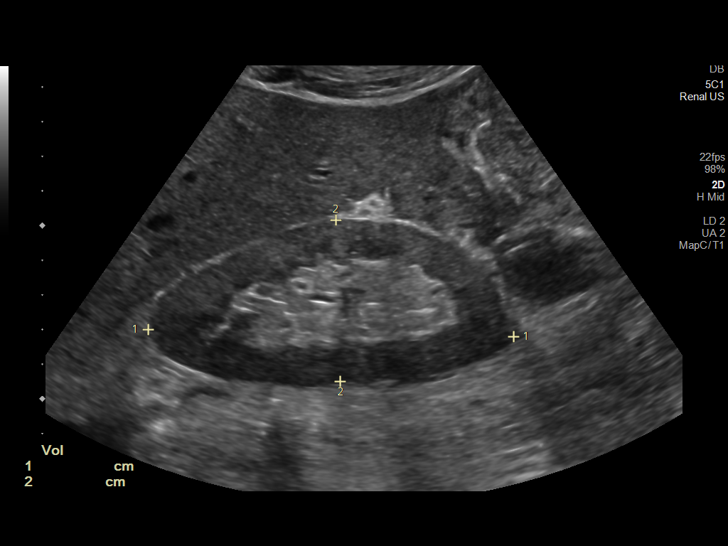
[im 19/75]
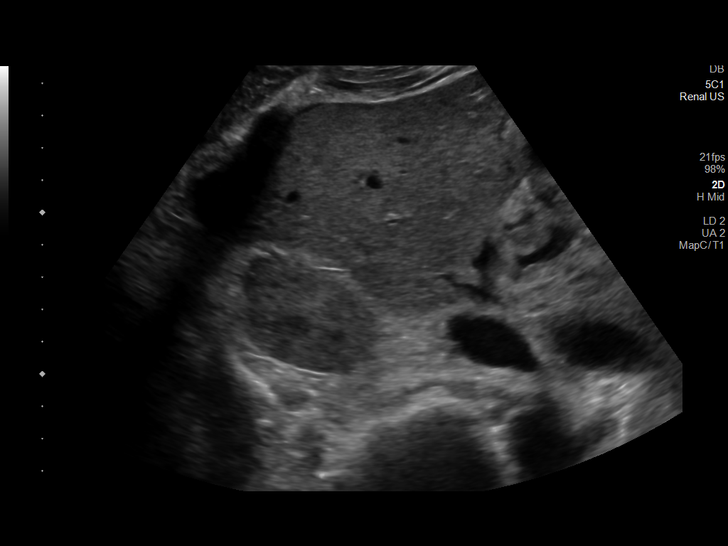
[im 25/75]
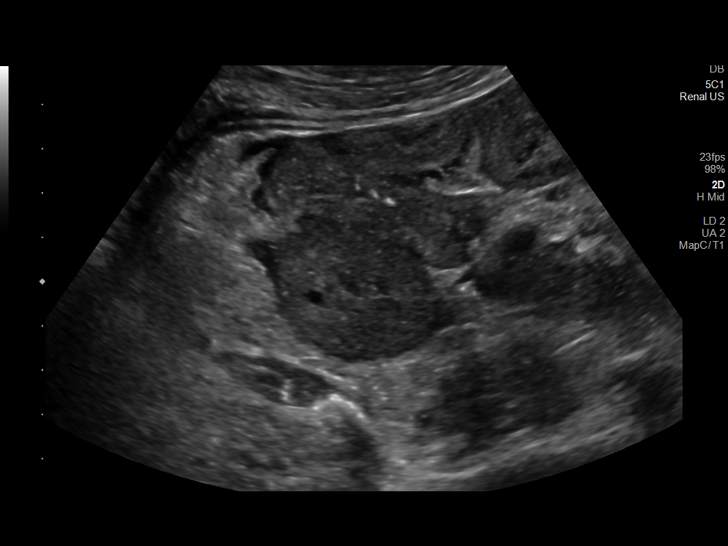
[im 28/75]
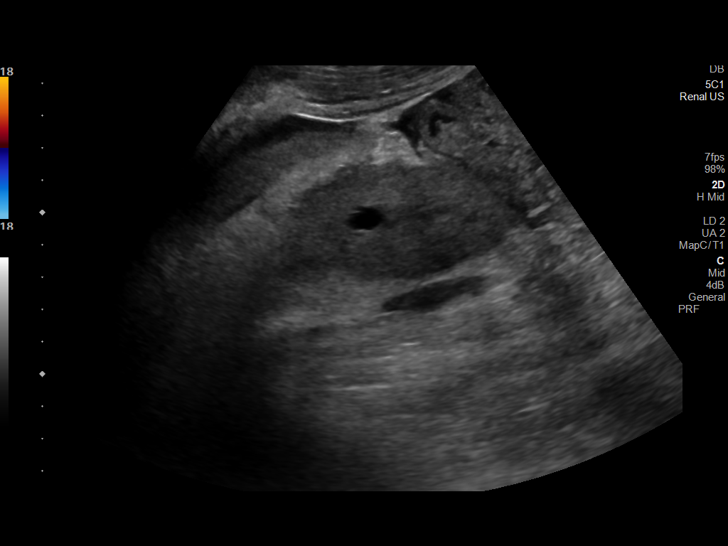
[im 34/75]
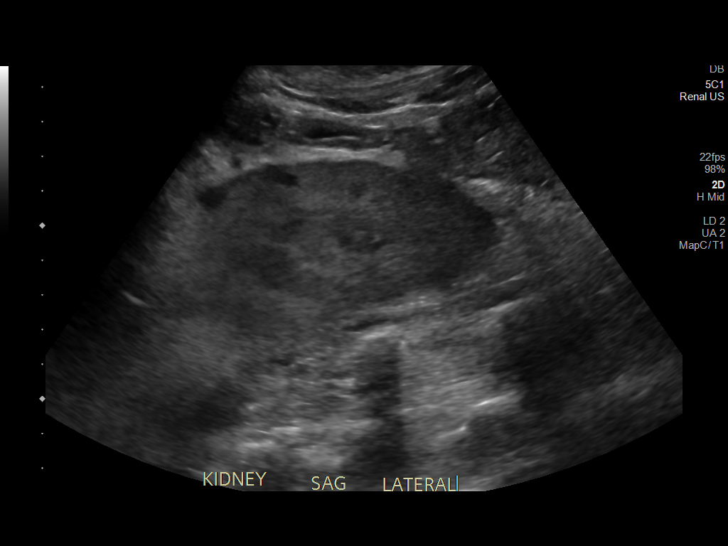
[im 41/75]
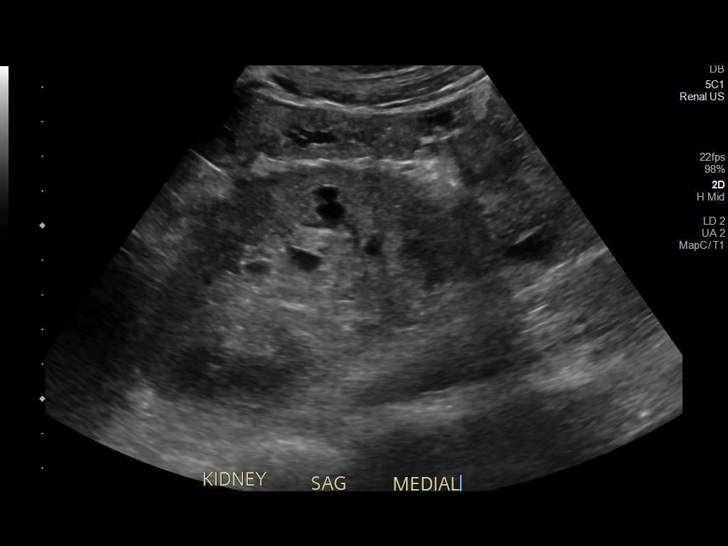
[im 47/75]
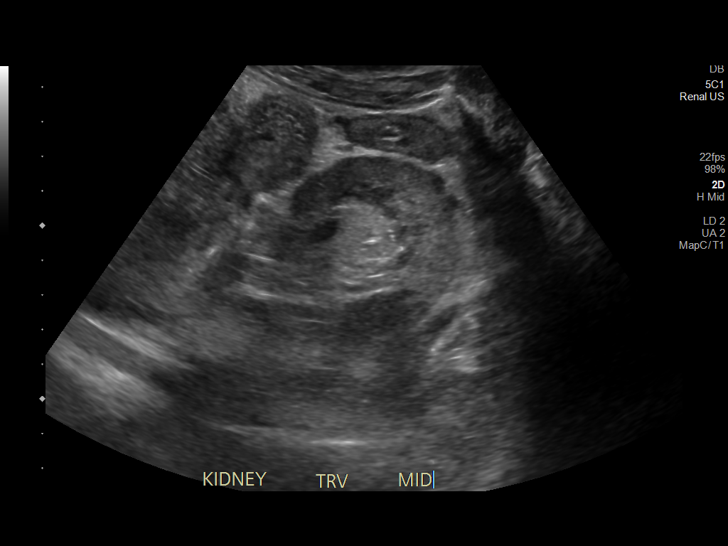
[im 50/75]
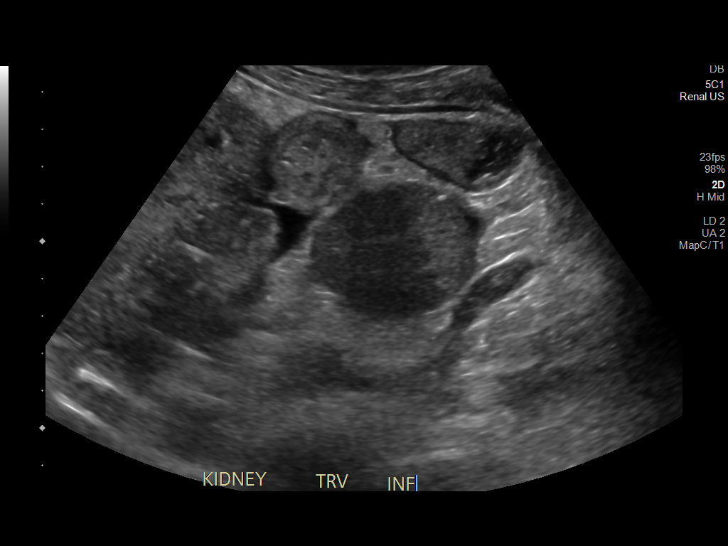
[im 56/75]
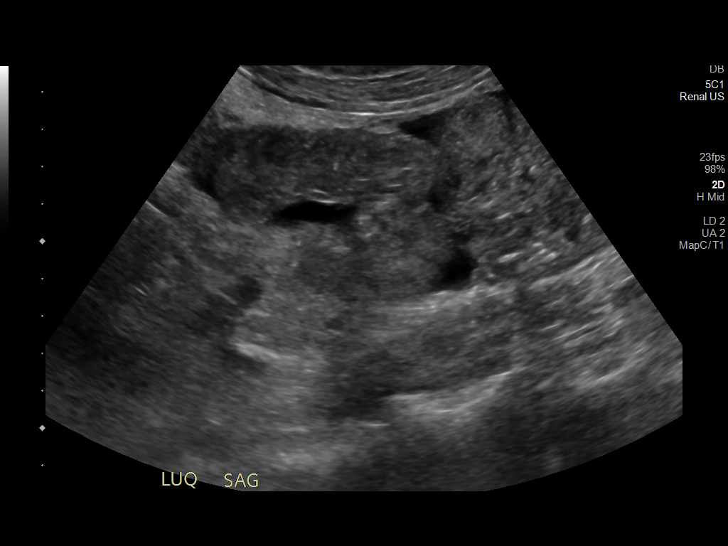
[im 62/75]
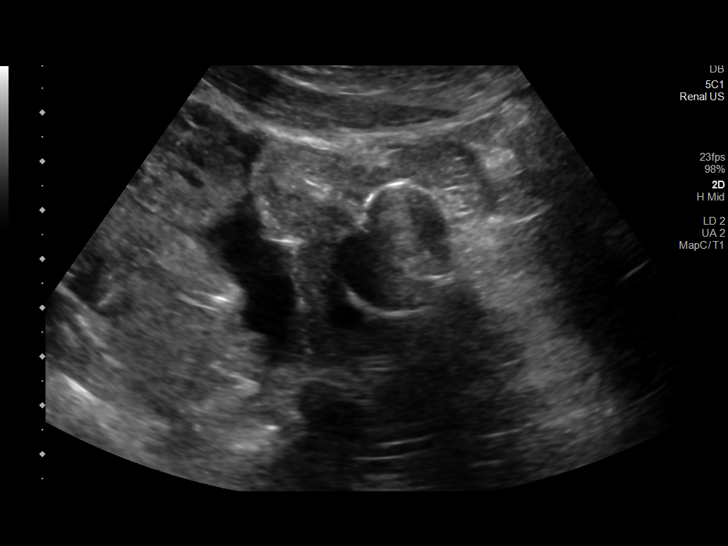
[im 68/75]
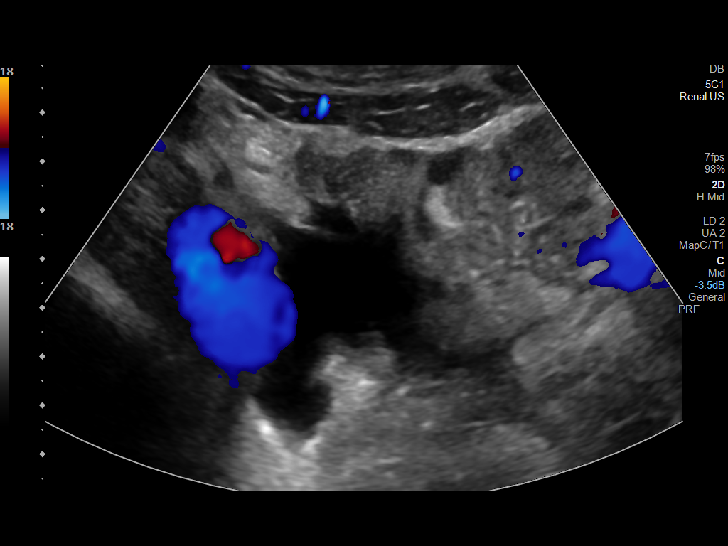
[im 75/75]
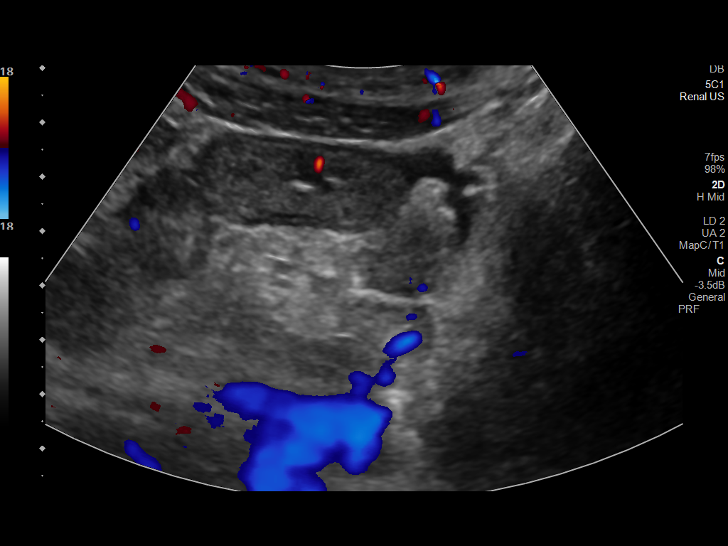

[14 of 25 positions shown; findings below may reference images not displayed]

FINDINGS: Right Kidney:

Renal measurements: 10.6 x 4.7 x 4.9 centimeters = volume: 125.9 mL.
Midpole cyst measures 1.1 centimeters. Mildly increased
echogenicity. No hydronephrosis.

Left Kidney:

Renal measurements: 11.3 x 5.9 x 4.7 centimeters = volume: 163.5 mL.
Small cysts are present, largest 1.1 centimeters. Mildly increased
echogenicity. No hydronephrosis.

Bladder:

Foley catheter decompresses urinary bladder.

Other:

Small amount of ascites noted in the 4 quadrants.
IMPRESSION: 1. Mildly increased renal echogenicity bilaterally.
2. No hydronephrosis.
3. Small bilateral renal cysts.
4. Small amount of ascites throughout the abdomen and pelvis.

## 2021-11-15 MED ORDER — POTASSIUM CHLORIDE 20 MEQ PO PACK
20.0000 meq | PACK | Freq: Once | ORAL | Status: AC
  Administered 2021-11-15: 20 meq
  Filled 2021-11-15: qty 1

## 2021-11-15 MED ORDER — DEXMEDETOMIDINE HCL IN NACL 400 MCG/100ML IV SOLN
0.4000 ug/kg/h | INTRAVENOUS | Status: DC
  Administered 2021-11-16: 0.4 ug/kg/h via INTRAVENOUS
  Administered 2021-11-16: 0.8 ug/kg/h via INTRAVENOUS
  Administered 2021-11-17: 0.6 ug/kg/h via INTRAVENOUS
  Filled 2021-11-15 (×3): qty 100

## 2021-11-15 MED ORDER — MAGNESIUM SULFATE 2 GM/50ML IV SOLN
2.0000 g | Freq: Once | INTRAVENOUS | Status: AC
  Administered 2021-11-15: 2 g via INTRAVENOUS
  Filled 2021-11-15: qty 50

## 2021-11-15 MED ORDER — PROSOURCE TF PO LIQD
45.0000 mL | Freq: Two times a day (BID) | ORAL | Status: DC
  Administered 2021-11-15 – 2021-11-17 (×5): 45 mL
  Filled 2021-11-15 (×5): qty 45

## 2021-11-15 MED ORDER — CLONIDINE HCL 0.2 MG PO TABS: 0.2000 mg | ORAL_TABLET | Freq: Two times a day (BID) | ORAL | Status: DC

## 2021-11-15 MED ORDER — POTASSIUM CHLORIDE 20 MEQ PO PACK
40.0000 meq | PACK | Freq: Once | ORAL | Status: AC
  Administered 2021-11-15: 40 meq
  Filled 2021-11-15: qty 2

## 2021-11-15 MED ORDER — CLONIDINE HCL 0.2 MG PO TABS
0.2000 mg | ORAL_TABLET | Freq: Once | ORAL | Status: AC
  Administered 2021-11-15: 0.2 mg
  Filled 2021-11-15: qty 1

## 2021-11-15 MED ORDER — ADULT MULTIVITAMIN W/MINERALS CH
1.0000 | ORAL_TABLET | Freq: Every day | ORAL | Status: DC
  Administered 2021-11-15 – 2021-11-17 (×3): 1
  Filled 2021-11-15 (×3): qty 1

## 2021-11-15 MED ORDER — VITAL HIGH PROTEIN PO LIQD
1000.0000 mL | ORAL | Status: DC
  Administered 2021-11-15 – 2021-11-16 (×2): 1000 mL

## 2021-11-15 NOTE — Progress Notes (Signed)
eLink Physician-Brief Progress Note Patient Name: Manuel Kramer DOB: 11/27/1955 MRN: [id number]   Date of Service  11/15/2021  HPI/Events of Note  Patient has a respiratory alkalosis, with a PH of 7.54.  eICU Interventions  Respiratory rate reduced from 18 to 12 by RT. Will order an interval ABG for  6 AM.        Thomasene Lot Jahel Wavra 11/15/2021, 4:03 AM

## 2021-11-15 NOTE — Procedures (Signed)
EEG Procedure CPT/Type of Study: [id number]; 24hr EEG with video Referring Provider: Hunsucker Primary Neurological Diagnosis: AMS  History: This is a 66 yr old patient, undergoing an EEG to evaluate for acute encephalopathy. Clinical State: comatose  Technical Description:  The EEG was performed using standard setting per the guidelines of American Clinical Neurophysiology Society (ACNS).  A minimum of 21 electrodes were placed on scalp according to the International 10-20 or/and 10-10 Systems. Supplemental electrodes were placed as needed. Single EKG electrode was also used to detect cardiac arrhythmia. Patient's behavior was continuously recorded on video simultaneously with EEG. A minimum of 16 channels were used for data display. Each epoch of study was reviewed manually daily and as needed using standard referential and bipolar montages. Computerized quantitative EEG analysis (such as compressed spectral array analysis, trending, automated spike & seizure detection) were used as indicated.   Day 1: from 1005 11/14/21 to 0730 11/15/21  EEG Description: Overall Amplitude: suppressed Predominant Frequency: The background activity showed background suppression with some brief low voltage 1hz  delta slowing. Superimposed Frequencies: none The background was symmetric  Background Abnormalities: Background suppression with rare low voltage slow activity Rhythmic or periodic pattern: No Epileptiform activity: no Electrographic seizures: no Events: no   Breach rhythm: no  Reactivity: Absent  Stimulation procedures:  Hyperventilation: not done Photic stimulation: not done  Sleep Background: none  EKG:no significant arrhythmia  Impression: This was a markedly abnormal continuous video EEG due to absent reactivity and near-complete background attenuation, indicative of a severe encephalopathy pattern. No seizures or epileptiform discharges were seen.

## 2021-11-15 NOTE — Progress Notes (Signed)
Neurology Progress Note   Subjective: Manuel Kramer is a 66 year old male with PMHx of T2DM, HTN, who was BIBEMS unresponsive for an unknown down time, after neighbors reported his medical alarm went off and consulted to the neurology service for evaluation of unresponsiveness. Today, Manuel Kramer does not open his eyes but does have some meaningful limb movements (see below).   Objective: Exam: Vitals:   11/15/21 1230 11/15/21 1245  BP: 123/77 134/79  Pulse: 76 73  Resp: 14 15  Temp: (!) 97.5 F (36.4 C) (!) 97.3 F (36.3 C)  SpO2: 99% 98%    Physical Exam  Constitutional: Appears well-developed and well-nourished.  Psych: comatose Eyes: No scleral injection HENT: No OP obstrucion MSK: no joint deformities.  Cardiovascular: Normal rate and regular rhythm.  Respiratory: Effort normal, non-labored breathing GI: Soft.  No distension. Skin: WDI  Neuro: Mental Status: Patient is comatose. Cranial Nerves: II: Pupils are equal, round, and reactive to light.   III,IV, VI: EOMI without ptosis or diploplia.  V, VII: Corneal reflex intact bilaterally, R>L VIII: Moves head away from loud voices IX, X: Cough and gag reflex intact XII: Tongue protrudes midline without atrophy or fasciculations.  Motor: Tone is normal. Bulk is normal. Withdraws BLE to pain, but not BUE; Meaningful movements of BUE to touch nose when nares stimulated. Sensory: Sensation is symmetric to pain in BLE. Deep Tendon Reflexes: 2+ and symmetric in the biceps, Achilles, and patellae. Plantars: Toes are downgoing bilaterally.      Pertinent Labs:  Component     Latest Ref Rng & Units 11/15/2021         3:30 AM  pH, Arterial     7.350 - 7.450 7.547 (H)  pCO2 arterial     32.0 - 48.0 mmHg 26.2 (L)  pO2, Arterial     83.0 - 108.0 mmHg 113 (H)  Bicarbonate     20.0 - 28.0 mmol/L 22.8  TCO2     22 - 32 mmol/L 24  O2 Saturation     % 99.0  Acid-Base Excess     0.0 - 2.0 mmol/L 1.0  Sodium     135 -  145 mmol/L 138  Potassium     3.5 - 5.1 mmol/L 2.3 (LL)  Calcium Ionized     1.15 - 1.40 mmol/L 1.21  HCT     39.0 - 52.0 % 30.0 (L)  Hemoglobin     13.0 - 17.0 g/dL 47.810.2 (L)  Patient temperature      98.2 F  Collection site      RADIAL, ALLEN'S TEST ACCEPTABLE  Drawn by      RT  Sample type      ARTERIAL   Component     Latest Ref Rng & Units 11/15/2021 11/15/2021         5:35 AM  6:32 AM  pH, Arterial     7.350 - 7.450 7.448   pCO2 arterial     32.0 - 48.0 mmHg 34.4   pO2, Arterial     83.0 - 108.0 mmHg 106   Bicarbonate     20.0 - 28.0 mmol/L 23.9   TCO2     22 - 32 mmol/L 25   O2 Saturation     % 98.0   Acid-Base Excess     0.0 - 2.0 mmol/L 0.0   Sodium     135 - 145 mmol/L 137 140  Potassium     3.5 - 5.1 mmol/L 3.4 (  L) 3.0 (L)  Calcium Ionized     1.15 - 1.40 mmol/L 1.22   HCT     39.0 - 52.0 % 35.0 (L) 36.0 (L)  Hemoglobin     13.0 - 17.0 g/dL 08.6 (L) 57.8 (L)  Patient temperature      36.7 C   Collection site      RADIAL, ALLEN'S TEST ACCEPTABLE   Drawn by      RT   Sample type      ARTERIAL    Triglycerides  Status: Final result    Visible to patient: No (inaccessible in MyChart)    Next appt: None    0 Result Notes       Component Ref Range & Units 06:32 6 yr ago 14 yr ago  Triglycerides <150 mg/dL 469 High   629  87 R        Protein and glucose, CSF Order: [id number] Status: Final result    Visible to patient: No (inaccessible in MyChart)    Next appt: None    0 Result Notes     Component Ref Range & Units 1 d ago  Glucose, CSF 40 - 70 mg/dL 010 High    Total  Protein, CSF 15 - 45 mg/dL 44   Comment: Performed at [facility], [address] [place], [place] [address]    CSF cell count with differential Order: [id number] Status: Final result    Visible to patient: No (inaccessible in MyChart)    Next appt: None    0 Result Notes     Component Ref Range & Units 1 d ago  Tube #  3   Color, CSF COLORLESS COLORLESS    Appearance, CSF CLEAR CLEAR Abnormal    Supernatant  NOT INDICATED   RBC Count, CSF 0 /cu mm 3 High    WBC, CSF 0 - 5 /cu mm 16 High Panic    Comment: CRITICAL RESULT CALLED TO, READ BACK BY AND VERIFIED WITH:  T.HUFF,RN @1815  11/14/2021 VANG.J   Segmented Neutrophils-CSF 0 - 6 % 94 High    Lymphs, CSF 40 - 80 % 6 Low    Monocyte-Macrophage-Spinal Fluid 15 - 45 % 0 Low    Eosinophils, CSF 0 - 1 % 0   Comment: Performed at [facility], [address], [place], [place]    CSF culture w Stat Gram Stain (Order [id number]) CSF culture w Stat Gram Stain Order: [id number] Status: Preliminary result    Visible to patient: No (not released)    Next appt: None    Specimen Information: CSF; Cerebrospinal Fluid  0 Result Notes    Component 1 d ago  Specimen Description CSF   Special Requests NONE   Gram Stain WBC PRESENT,BOTH PMN AND MONONUCLEAR  NO ORGANISMS SEEN   Culture NO GROWTH < 24 HOURS  Performed at [facility], [address] [place], [place]   Report Status PENDING   Resulting Agency [facility]    Hemoglobin A1c Order: [id number] Status: Final result    Visible to patient: No (inaccessible in MyChart)    Next appt: None    0 Result Notes   1 HM Topic          Component Ref Range & Units 1 d ago (11/14/21) 5 yr ago (05/22/16) 6 yr ago (07/01/15) 6 yr ago (01/08/15) 7 yr ago (10/02/14) 14 yr ago (08/17/07)  Hgb A1c MFr Bld 4.8 -  5.6 % 9.9 High   9.8 R  10.60 R  9.40 R  11.5 R     5.0 (NOTE)   The ADA recommends the following therapeutic goals for glycemic   control related to Hgb A1C measurement:   Goal of Therapy:   < 7.0% Hgb A1C   Action Suggested:  > 8.0% Hgb A1C   Ref:  Diabetes Care, 22, Suppl. 1, 1999 R    Comment: (NOTE)  Pre diabetes:          5.7%-6.4%   Diabetes:              >6.4%   Glycemic control for   <7.0%  adults with diabetes   Mean Plasma Glucose mg/dL 578.46      962 R   Comment: Performed at [facility]  Lab, [address], [place], [place] [address]     Beta-hydroxybutyric acid  Status: Final result    Visible to patient: No (inaccessible in MyChart)    Next appt: None    0 Result Notes     Component Ref Range & Units 1 d ago  Beta-Hydroxybutyric Acid 0.05 - 0.27 mmol/L 1.08 High    Comment: Performed at [facility], [address] [place], [place] [address]     Blood cx neg  Imaging Reviewed: EEG with absent reactivity and nearly complete background attenuation, indicative of severe encephalopathy.  Assessment: 66 year old male evaluated for unresponsiveness. On EEG, generalized slowing improved as he was being assessed. Patient with purposeful movements and withdrawing to pain. Unknown time down, so there is concern for an anoxic brain injury. Cannot yet rule out stroke without further imaging. Also need to rule out PRES vs hypertensive emergency. Patient was hyperglycemic to 533 but normal anion gap and non acidotic arterial pH.   Recommendations: 1)Obtain MRI 2) Wean sedation as tolerated 3) Neurology will continue to follow for further recs after imaging and once patient is better able to assess off of sedation.   Pt seen by Neuro Psych Resident and later by attending MD. Note/plan to be edited by MD as needed.    Lamar Sprinkles, MD PGY-1 11/15/2021  1:31 PM

## 2021-11-15 NOTE — Procedures (Signed)
[facility] EEG   HISTORY:  66 year old male presents with unresponsiveness.   INTRODUCTION:  A digital EEG was performed in the laboratory using the standard international 10/20 system of electrode placement in addition to one channel of EKG monitoring.  Hyperventilation and photic Stimulation were not performed.  This tracing captures the patient in the comatose state.  This EEG was recording from 11/14/2021 23:14:00 to 11/15/2021 00:14:00.     DESCRIPTION OF RECORD:  In the comatose state there is generalized suppression with no clear posterior dominant rhythm seen.  Generalized suppression is seen at times.  At other times, significant muscle in all leads are seen, which limits interpretation at times. No clear focal slowing, sharp waves, or epileptiform discharges are seen.   Heart rate is 90 bpm.   IMPRESSION:  This is an abnormal adult EEG in the comatose state.  The generalized suppression is consistent with an underlying severe encephalopathy.  Alternatively, it could be due to the effect of sedative medications or bilateral cerebral dysfunction.  No focal slowing, epileptiform abnormalities, or electroencephalographic seizures are identified in this record.  This does not rule out the diagnosis of epilepsy.  Clinical correlation is recommended.

## 2021-11-15 NOTE — Progress Notes (Signed)
See below assessment of neurologic status. RN informed CCM NP of neuro change, order given for stat head CT.    11/15/21 1630  Neurological  Level of Consciousness Responds to Pain  Neuro (WDL) X  Orientation Level Intubated/Tracheostomy - Unable to assess  Cognition Unable to follow commands  Speech Intubated/Tracheostomy - Unable to assess  Pupil Assessment  Yes (Downward gaze bilateral pupils)  R Pupil Size (mm) 4  R Pupil Shape Round  R Pupil Reaction Sluggish  L Pupil Size (mm) 4  L Pupil Shape Round  L Pupil Reaction Sluggish  Additional Pupil Assessments No  Motor Function/Sensation Assessment Motor response;Motor strength  RUE Motor Response No movement to painful stimulus  RUE Motor Strength 0  LUE Motor Response Purposeful movement  LUE Motor Strength 4  RLE Motor Response Non-purposeful movement  RLE Motor Strength 1  LLE Motor Response Purposeful movement  LLE Motor Strength 3

## 2021-11-15 NOTE — Procedures (Signed)
[facility]  Long-term EEG Report EEG reviewed period : 11/15/2021 00:14:00 to 11/15/2021 12:14:00 Eilene Ghazi Standard Time)  Patient Name:   Manuel Kramer, Manuel Kramer Date of Birth:   January 05, 1956 Identification Number:   MRN - [id number] Study Start Time:   11/15/2021 07:30:00  Study End Time:   11/15/2021 11:50:00  Indication: Encephalopathy,  Technical Summary: A routine 20 channel electroencephalogram using the international 10-20 system of electrode placement was performed.  Background: 3-4 Hz, Poorly formed  States       Coma  Abnormalities  Generalized Slowing: Diffuse generalized slowing Background Slowing: The background consists of 20-50 uV, 3-4 Hz diffuse activity with superimposed diffuse polymorphic delta activity that is non reactive to external stimulation.   Activation Procedures  Hyperventilation: Not performed  Photic Stimulation: Not performed  Classification: Abnormal :  Diagnosis: This is an abnormal EEG due to diffuse delta slowing with intermittent overriding beta activity. This is indicative of generalized brain dysfunction.      Dr Tsosie Billing   [facility] [phone]  Case MD:[id number]

## 2021-11-15 NOTE — Procedures (Signed)
[facility] LTM EEG  HISTORY:  66 year old male presents with unresponsiveness.  INTRODUCTION:  A digital LTM EEG was performed in the laboratory using the standard international 10/20 system of electrode placement in addition to one channel of EKG monitoring.  Hyperventilation and photic Stimulation were not performed.  This tracing captures the patient in the comatose state.  This EEG was recording from 11/14/2021 11:14:00 to 11/14/2021 23:14:00.   DESCRIPTION OF RECORD:  In the comatose state there is generalized suppression with no clear posterior dominant rhythm seen.  Generalized suppression is seen at times.  At other times, muscle and EKG artifact are seen. No focal slowing, sharp waves, or epileptiform discharges are seen.  Heart rate is 100 bpm.  IMPRESSION:  This is an abnormal adult LTM EEG in the comatose state.  The generalized suppression is consistent with an underlying severe encephalopathy.  Alternatively, it could be due to the effect of sedative medications or bilateral cerebral dysfunction.  No focal slowing, epileptiform abnormalities, or electroencephalographic seizures are identified in this record.  This does not rule out the diagnosis of epilepsy.  Clinical correlation is recommended.

## 2021-11-15 NOTE — Progress Notes (Signed)
Initial Nutrition Assessment  DOCUMENTATION CODES:   Severe malnutrition in context of chronic illness  INTERVENTION:   Initiate tube feeding via OG tube: Vital High Protein at 40 ml/h (960 ml per day) Prosource TF 45 ml BID  Provides 1040 kcal, 106 gm protein, 802 ml free water daily TF regimen, Cleviprex, and propofol at current rate providing 1880 total kcal/day   MVI with minerals daily   Monitor magnesium, potassium, and phosphorus BID for at least 3 days, MD to replete as needed, as pt is at risk for refeeding syndrome given severe malnutrition.   NUTRITION DIAGNOSIS:   Severe Malnutrition related to chronic illness as evidenced by severe muscle depletion, severe fat depletion.  GOAL:   Patient will meet greater than or equal to 90% of their needs  MONITOR:   TF tolerance  REASON FOR ASSESSMENT:   Consult Assessment of nutrition requirement/status  ASSESSMENT:   Pt with PMH of tobacco abuse, DM, HTN, and chronic diarrhea admitted 1/16 after being found by police unresponsive in home; pt with acute metabolic encephalopathy and sepsis.    Pt discussed during ICU rounds and with RN.  Workup on going for unclear etiology of acute metabolic encephalopathy and sepsis unclear etiology.  No family at bedside.   Patient is currently intubated on ventilator support MV: 6.1 L/min Temp (24hrs), Avg:98.7 F (37.1 C), Min:95.4 F (35.2 C), Max:101.6 F (38.7 C)  Propofol: 10 ml/hr provides: 264 kcal  Cleviprex @ 12 ml/hr provides: 576 kcal  Medications reviewed and include: SSI, protonix KCl x 2  LR @ 125 ml/hr  Mag sulfate x 1   Labs reviewed: K: 3, TG: 219 A1C: 9.9 CBG's: 78-135    NUTRITION - FOCUSED PHYSICAL EXAM:  Flowsheet Row Most Recent Value  Orbital Region Severe depletion  Upper Arm Region Severe depletion  Thoracic and Lumbar Region Severe depletion  Buccal Region Unable to assess  Temple Region Moderate depletion  Clavicle Bone Region  Moderate depletion  Clavicle and Acromion Bone Region Mild depletion  Scapular Bone Region Unable to assess  Dorsal Hand Unable to assess  Patellar Region Severe depletion  Anterior Thigh Region Severe depletion  Posterior Calf Region Moderate depletion  Edema (RD Assessment) None  Hair Reviewed  Eyes Unable to assess  Mouth Unable to assess  Skin Reviewed  Nails Unable to assess       Diet Order:   Diet Order             Diet NPO time specified  Diet effective now                   EDUCATION NEEDS:   Not appropriate for education at this time  Skin:  Skin Assessment: Reviewed RN Assessment  Last BM:  1/16  Height:   Ht Readings from Last 1 Encounters:  11/14/21 5\' 6"  (1.676 m)    Weight:   Wt Readings from Last 1 Encounters:  11/15/21 61.1 kg    BMI:  Body mass index is 21.74 kg/m.  Estimated Nutritional Needs:   Kcal:  1800-2000  Protein:  90-105 grams  Fluid:  >1.8 L/day  11/17/21., RD, LDN, CNSC See AMiON for contact information

## 2021-11-15 NOTE — Progress Notes (Signed)
°  Transition of Care [facility]) Screening Note   Patient Details  Name: Manuel Kramer Date of Birth: 07-21-1956   Transition of Care [facility]) CM/SW Contact:    Geralynn Ochs, LCSW Phone Number: 11/15/2021, 10:23 AM    Transition of Care Department [facility]) has reviewed patient and no TOC needs have been identified at this time; medical workup ongoing. We will continue to monitor patient advancement through interdisciplinary progression rounds. If new patient transition needs arise, please place a TOC consult.

## 2021-11-15 NOTE — Progress Notes (Signed)
LTM EEG hooked up and running - no initial skin breakdown - push button tested - neuro notified. [facility] monitoring.  

## 2021-11-15 NOTE — Progress Notes (Signed)
Patient was transported to CT & back to 4N30 without any complications.  

## 2021-11-15 NOTE — Progress Notes (Signed)
PCCM Interval Note  Called to bedside around 1710  r/t to less responsive since stopping propofol gtt around 1630.  Also temp has steady declined since am.    Blood pressure (!) 158/96, pulse 62, temperature (!) 96.3 F (35.7 C), resp. rate 13, height 5\' 6"  (1.676 m), weight 61.1 kg, SpO2 100 %.  Glucose stable 172  On exam, patient has downward gaze preference, pupils 3/reactive but sluggish, +cough, triggering vent, no movement of RUE to noxious stimuli, some movement in LUE and brisk withdrawal in LLE, flicker to RLE with downward curling of toes.  Does not open eyes or f/c.    This is a change from earlier in the day, where he has been intermittently agitated and was purposefully reaching for ETT when propofol decreased but not f/c.    P:  Given concern for right sided focal deficits, stat [facility] ordered Discussed with neurology who will evaluate in CT.        [facility], ACNP [place] [facility] 11/15/2021, 5:52 PM  See Amion for pager If no response to pager, please call PCCM consult pager After 7:00 pm call Elink

## 2021-11-15 NOTE — Progress Notes (Signed)
NAME:  Manuel MinaBertrand E Kramer, MRN:  [id number], DOB:  [birth year], LOS: 1 ADMISSION DATE:  11/14/2021, CONSULTATION DATE:  1/16 REFERRING MD:  Dr. Eudelia Bunchardama, CHIEF COMPLAINT:  encephalopathy; fever; htn emergency   History of Present Illness:  Patient is a 66 year old male with pertinent PMH tobacco abuse, DMT2, HTN [facility] on 1/16 after being found unresponsive.  On 1/16, EMS called to patient's house at 1:30 AM after medical alarm going off.  Police had to break into the home to get inside and upon entry, found patient in bed unresponsive.  Patient BP initially 200/100.  EMS states patient was incoherently combative with right eye deviation.  Elevated CBG.  [facility].  [facility], patient hypertensive, fever 101.4 F, and unresponsive.  Patient intubated for airway protection.  CT and CTA head negative. Started on cardene. Neuro consulted. Recommend starting EEG. Keppra started.   Lactic acid 4.6.  VBG 7.47, 39, 45, 29.  NA 132.  Glucose 533.  Creat 1.68.  WBC 12.3.  Troponin 30.  Ethanol and ammonia WNL.  UA clean. UDS positive for benzos though collected after RSI.   PCCM consulted for ICU admission.  Pertinent  Medical History   Past Medical History:  Diagnosis Date   Chronic diarrhea    Diabetes mellitus without complication (HCC)    Hypertension    Significant Hospital Events: Including procedures, antibiotic start and stop dates in addition to other pertinent events   1[facility]16: [facility]; unresponsive; intubated for airway protection; on cleviprex, LTM, LP, vanc/ cefepime and acyclovir added, worsening renal function  Interim History / Subjective:  No clinical seizures, remains on LTM Propofol decreased and patient localizing to ETT but otherwise not f/c MRI planned for this afternoon ~1530 Remains on cleviprex 8mg / hr UOP down overnight  Tmax 101.6   Objective   Blood pressure (!) 174/94, pulse 88, temperature 99 F (37.2 C), resp. rate 14,  height 5\' 6"  (1.676 m), weight 61.1 kg, SpO2 100 %.    Vent Mode: PRVC FiO2 (%):  [30 %] 30 % Set Rate:  [12 bmp-18 bmp] 12 bmp Vt Set:  [510 mL] 510 mL PEEP:  [5 cmH20] 5 cmH20 Plateau Pressure:  [11 cmH20-19 cmH20] 13 cmH20   Intake/Output Summary (Last 24 hours) at 11/15/2021 0955 Last data filed at 11/15/2021 0700 Gross per 24 hour  Intake 3668.84 ml  Output 1000 ml  Net 2668.84 ml   Filed Weights   11/14/21 0650 11/15/21 0500  Weight: 60 kg 61.1 kg   Examination: Propofol at 40 General:  critically ill older adult thin male sedated in NAD HEENT: MM pink/moist, pupils 3/reactive, ETT/ OGT, remains on LTM Neuro:  some spont movement to noxious stimuli, does not localize or follow commands CV: rr, NSR PULM:  non labored on full MV support, lungs clear GI: soft, bs+, ND, foley Extremities: warm/dry, no LE edema  Skin: no rashes   Labs:  K 3, sCr 1.83 > 2, WBC 14.7  LP > WBC 16, neutrophils 94%, protein 44, glucose 136, GS neg LP culture, HSV, VZV and enterovirus pending CBGS overnight as low as 70's but back up this morning   Assessment & Plan:   Acute metabolic encephalopathy  - unclear etiology at this point ddx include seizure vs CNS process vs HHS vs PRES? -  CTA/ perfusion- no LVO or perfusion deficit - UDS +benzo,  Ammonia 51 P:  - Neurology following, appreciate recs - s/p  LP 1/16 after no other obvious infectious etiology (CXR/ UA neg, pending BC).  WBC is high but glucose is also high (presented hyperglycemic) and protein wnl, going against bacterial process so far.  Follow LP culture, pending HSV/ VZV/ enterovirus - continue vanc, cefepime, and acyclovir for now - MRI planned for this afternoon - ongoing LTM - seizures precautions - Keppra given 1/16, further AEDs per Neurology  - Maintain neuro protective measures; goal for eurothermia, euglycemia, eunatermia, normoxia, and PCO2 goal of 35-40   Acute hypoxic respiratory failure with inability to protect  airway - 2/2  above. - Full MV support - rate adjusted overnight for resp alkalosis  - leave intubated for MRI today, afterwards can wean/ assess to extubate - PAD protocol, propofol (leave for MRI then stop as triglycerides up and on cleviprex) > then switch to precedex/ prn fentanyl w/ bowel regimen, RASS goal 0/-1 - VAP/ PPI  - CXR 1/17 stable ETT/ OGT, non acute    Sepsis - unclear etiology thus far - continue empiric broad spectrum abx - follow cultures. - trend CBC/ fever curve  - tylenol prn fever   HTN emergency - continue Cleviprex, goal SBP < 160 - continue norvasc 10mg  and increase clonidine to 0.2 mg TID - holding home Lisinopril with AKI    AKI, likely multifactorial in the setting of HTN emergency, home ACEi, s/p IV dye, and still needing vanc/ acyclovir at this time  - low UOP and increasing sCr despite fluids - check renal ultrasound and lytes  - continue foley  - continue MIVF  - trend BMP / urinary output/ strict I/Os - renal dose meds per pharmacy    Hypokalemia  - K 3, Mag 1.8 - KCL replete and Mag 2 gm - recheck in am    HHS with hx underlying DM. - continue SSI/ CBG q 4 - hold semglee given lower glucose overnight, likely can resume after TF started    Best Practice (right click and "Reselect all SmartList Selections" daily)   Diet/type: NPO, starting TF DVT prophylaxis: prophylactic heparin  GI prophylaxis: PPI Lines: N/A Foley:  Yes, and it is still needed Code Status:  full code Last date of multidisciplinary goals of care discussion: pending.  Patient has two children, daughter and ?son.  An ex-wife but possibly never divorced called last evening wanting information but apparently have not been together in over 10 yrs.  Will continue relaying information to daughter as legal NOK at this time till further verification/ clarification be determined.   Labs   CBC: Recent Labs  Lab 11/14/21 0418 11/14/21 0420 11/15/21 0330  11/15/21 0535 11/15/21 0632  WBC  --  12.3*  --   --  14.7*  NEUTROABS  --  10.8*  --   --   --   HGB 15.0   14.3 13.8 10.2* 11.9* 12.6*  HCT 44.0   42.0 41.1 30.0* 35.0* 36.0*  MCV  --  90.5  --   --  87.8  PLT  --  214  --   --  215    Basic Metabolic Panel: Recent Labs  Lab 11/14/21 0418 11/14/21 0420 11/14/21 1219 11/15/21 0330 11/15/21 0535 11/15/21 0632  NA 134*   133* 132* 136 138 137 140  K 3.8   3.8 4.0 2.8* 2.3* 3.4* 3.0*  CL 96* 97* 102  --   --  104  CO2  --  24 21*  --   --  23  GLUCOSE  531* 533* 183*  --   --  111*  BUN 16 14 14   --   --  17  CREATININE 1.40* 1.68* 1.83*  --   --  2.05*  CALCIUM  --  8.6* 8.4*  --   --  8.7*  MG  --   --   --   --   --  1.8  PHOS  --   --   --   --   --  3.0   GFR: Estimated Creatinine Clearance: 31.5 mL/min (A) (by C-G formula based on SCr of 2.05 mg/dL (H)). Recent Labs  Lab 11/14/21 0418 11/14/21 0420 11/14/21 0721 11/15/21 0632  WBC  --  12.3*  --  14.7*  LATICACIDVEN 4.6*  --  4.7*  --     Liver Function Tests: Recent Labs  Lab 11/14/21 0420  AST 22  ALT 23  ALKPHOS 97  BILITOT 1.0  PROT 5.9*  ALBUMIN 3.0*   No results for input(s): LIPASE, AMYLASE in the last 168 hours. Recent Labs  Lab 11/14/21 0702  AMMONIA 51*    ABG    Component Value Date/Time   PHART 7.448 11/15/2021 0535   PCO2ART 34.4 11/15/2021 0535   PO2ART 106 11/15/2021 0535   HCO3 23.9 11/15/2021 0535   TCO2 25 11/15/2021 0535   O2SAT 98.0 11/15/2021 0535     Coagulation Profile: Recent Labs  Lab 11/14/21 0420  INR 0.8    Cardiac Enzymes: No results for input(s): CKTOTAL, CKMB, CKMBINDEX, TROPONINI in the last 168 hours.  HbA1C: Hemoglobin A1C  Date/Time Value Ref Range Status  05/22/2016 11:16 AM 9.8  Final  07/01/2015 11:50 AM 10.60  Final   Hgb A1c MFr Bld  Date/Time Value Ref Range Status  11/14/2021 12:19 PM 9.9 (H) 4.8 - 5.6 % Final    Comment:    (NOTE) Pre diabetes:          5.7%-6.4%  Diabetes:               >6.4%  Glycemic control for   <7.0% adults with diabetes   08/17/2007 04:12 AM   Final   5.0 (NOTE)   The ADA recommends the following therapeutic goals for glycemic   control related to Hgb A1C measurement:   Goal of Therapy:   < 7.0% Hgb A1C   Action Suggested:  > 8.0% Hgb A1C   Ref:  Diabetes Care, 22, Suppl. 1, 1999    CBG: Recent Labs  Lab 11/14/21 1647 11/14/21 1955 11/14/21 2342 11/15/21 0346 11/15/21 0744  GLUCAP 100* 111* 78 78 135*     Critical care time: 40 mins    11/17/21, ACNP [facility] 11/15/2021, 9:56 AM  See Amion for pager If no response to pager, please call PCCM consult pager After 7:00 pm call Elink

## 2021-11-15 NOTE — Progress Notes (Signed)
LTM EEG discontinued - no skin breakdown at unhook.   

## 2021-11-16 ENCOUNTER — Inpatient Hospital Stay (HOSPITAL_COMMUNITY): Payer: Medicaid Other

## 2021-11-16 DIAGNOSIS — G9340 Encephalopathy, unspecified: Secondary | ICD-10-CM | POA: Diagnosis not present

## 2021-11-16 DIAGNOSIS — E43 Unspecified severe protein-calorie malnutrition: Secondary | ICD-10-CM | POA: Insufficient documentation

## 2021-11-16 LAB — CBC
HCT: 30.8 % — ABNORMAL LOW (ref 39.0–52.0)
Hemoglobin: 10.3 g/dL — ABNORMAL LOW (ref 13.0–17.0)
MCH: 30.3 pg (ref 26.0–34.0)
MCHC: 33.4 g/dL (ref 30.0–36.0)
MCV: 90.6 fL (ref 80.0–100.0)
Platelets: 205 10*3/uL (ref 150–400)
RBC: 3.4 MIL/uL — ABNORMAL LOW (ref 4.22–5.81)
RDW: 12.3 % (ref 11.5–15.5)
WBC: 12.9 10*3/uL — ABNORMAL HIGH (ref 4.0–10.5)
nRBC: 0 % (ref 0.0–0.2)

## 2021-11-16 LAB — BASIC METABOLIC PANEL
Anion gap: 10 (ref 5–15)
BUN: 24 mg/dL — ABNORMAL HIGH (ref 8–23)
CO2: 21 mmol/L — ABNORMAL LOW (ref 22–32)
Calcium: 8.3 mg/dL — ABNORMAL LOW (ref 8.9–10.3)
Chloride: 106 mmol/L (ref 98–111)
Creatinine, Ser: 2.06 mg/dL — ABNORMAL HIGH (ref 0.61–1.24)
GFR, Estimated: 35 mL/min — ABNORMAL LOW (ref >60)
Glucose, Bld: 217 mg/dL — ABNORMAL HIGH (ref 70–99)
Potassium: 3 mmol/L — ABNORMAL LOW (ref 3.5–5.1)
Sodium: 137 mmol/L (ref 135–145)

## 2021-11-16 LAB — GLUCOSE, CAPILLARY
Glucose-Capillary: 105 mg/dL — ABNORMAL HIGH (ref 70–99)
Glucose-Capillary: 124 mg/dL — ABNORMAL HIGH (ref 70–99)
Glucose-Capillary: 180 mg/dL — ABNORMAL HIGH (ref 70–99)
Glucose-Capillary: 197 mg/dL — ABNORMAL HIGH (ref 70–99)
Glucose-Capillary: 219 mg/dL — ABNORMAL HIGH (ref 70–99)
Glucose-Capillary: 243 mg/dL — ABNORMAL HIGH (ref 70–99)

## 2021-11-16 LAB — MAGNESIUM
Magnesium: 2.1 mg/dL (ref 1.7–2.4)
Magnesium: 2.1 mg/dL (ref 1.7–2.4)

## 2021-11-16 LAB — PHOSPHORUS
Phosphorus: 4.1 mg/dL (ref 2.5–4.6)
Phosphorus: 4.2 mg/dL (ref 2.5–4.6)

## 2021-11-16 LAB — AMMONIA: Ammonia: 15 umol/L (ref 9–35)

## 2021-11-16 LAB — VZV PCR, CSF: VZV PCR, CSF: NEGATIVE

## 2021-11-16 LAB — ENTEROVIRUS PCR: Enterovirus PCR: NEGATIVE

## 2021-11-16 IMAGING — MR MR HEAD W/O CM
6 of 10 series · 28 of 48 positions shown · non-contrast
Comparison: Head CT [DATE].  CT angiogram head/neck [DATE].

CLINICAL DATA: Provided history: Mental status change, unknown
cause. Additional history provided: Patient found unresponsive at
home this morning, incoherent, combative, right eye deviation.
Hypertensive.

EXAM:
MRI HEAD WITHOUT CONTRAST
TECHNIQUE: Multiplanar, multiecho pulse sequences of the brain and surrounding
structures were obtained without intravenous contrast.

[Series 2: DWI · axial · 3.0mm · 0.94mm/px · z∈[-101,+38]mm · 8 of 100 slices shown (1 of 2)]
[im 1/100]
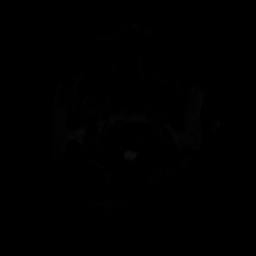
[im 12/100]
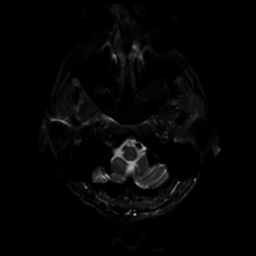
[im 34/100]
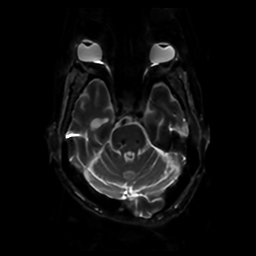
[im 45/100]
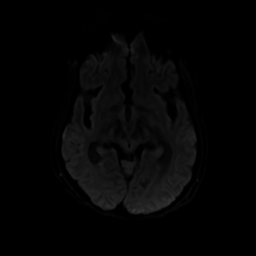
[im 56/100]
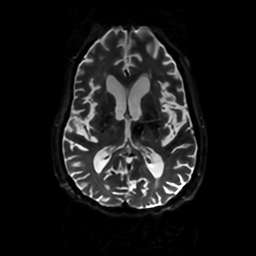
[im 67/100]
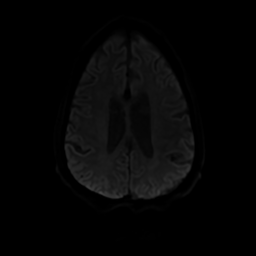
[im 89/100]
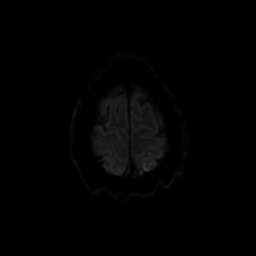
[im 100/100]
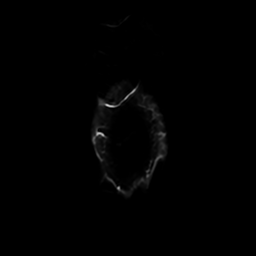

[Series 3: DWI · coronal · 4.0mm · 0.94mm/px · 7 of 74 slices shown (2 of 2)]
[im 1/74]
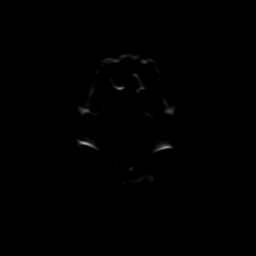
[im 13/74]
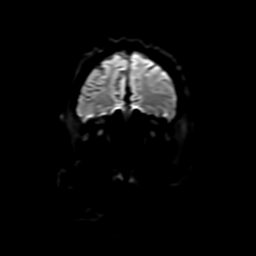
[im 25/74]
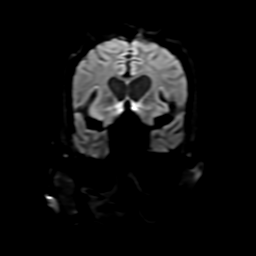
[im 37/74]
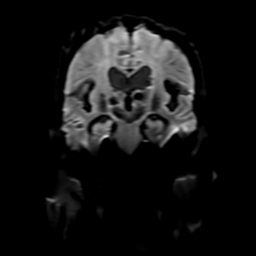
[im 49/74]
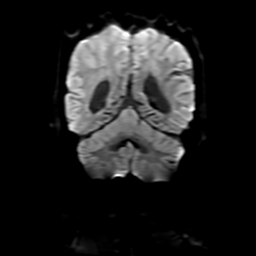
[im 61/74]
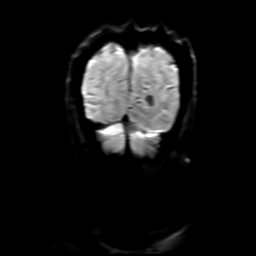
[im 74/74]
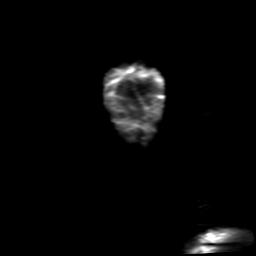

[Series 4: FLAIR · sagittal · 5.0mm · 0.23mm/px · 2 of 23 slices shown (1 of 2)]
[im 1/23]
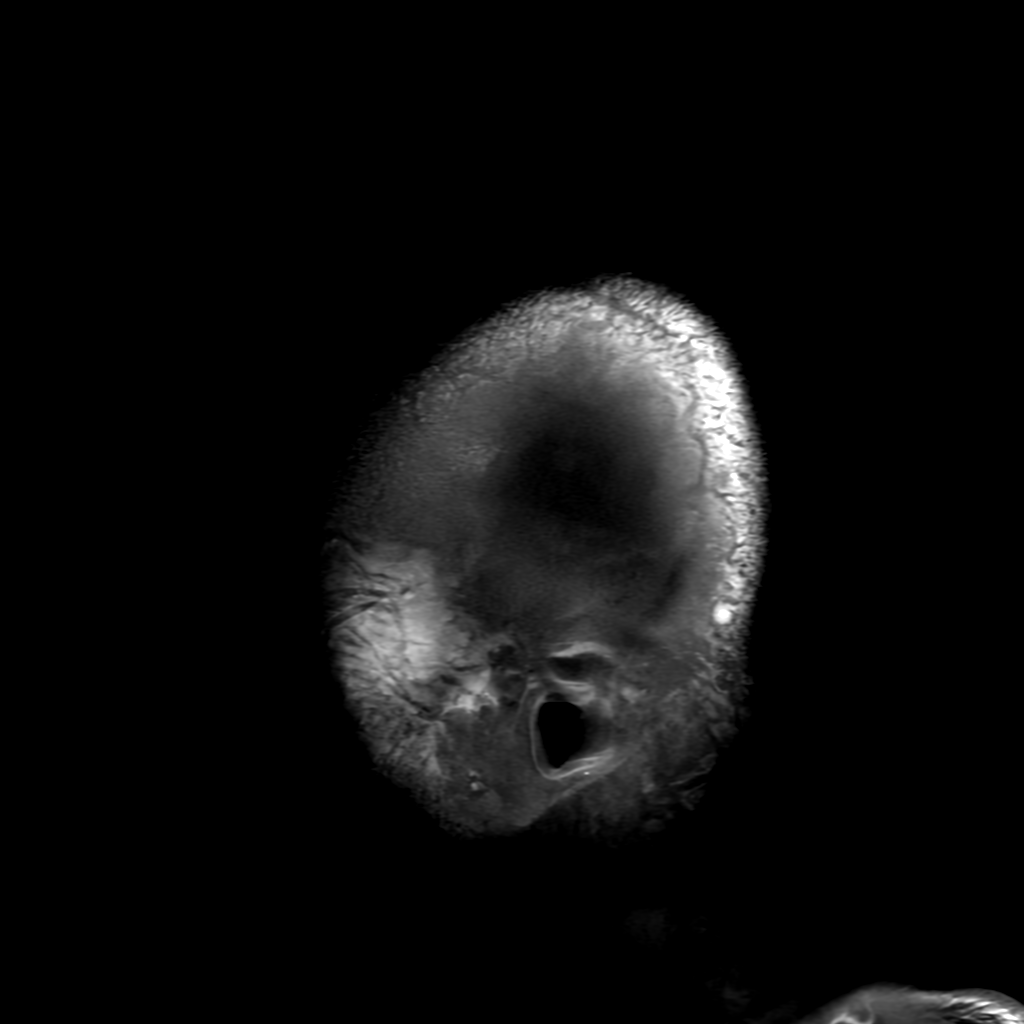
[im 23/23]
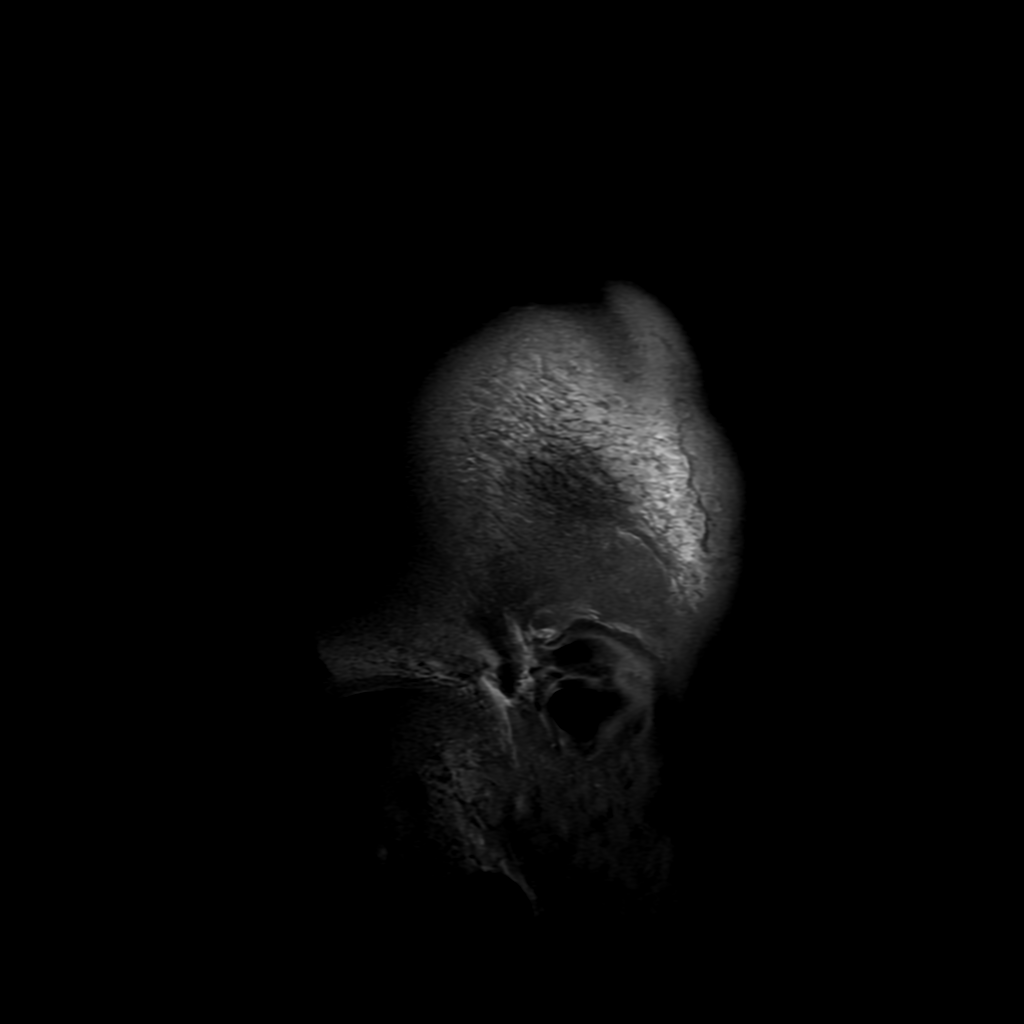

[Series 6: FLAIR · axial · 4.0mm · 0.45mm/px · z∈[-107,+31]mm · 3 of 35 slices shown (2 of 2)]
[im 1/35]
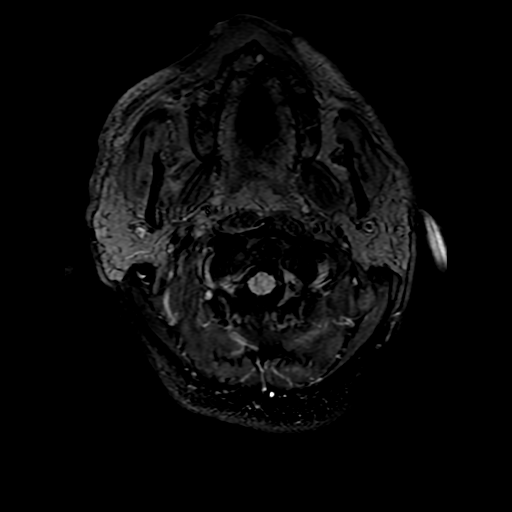
[im 18/35]
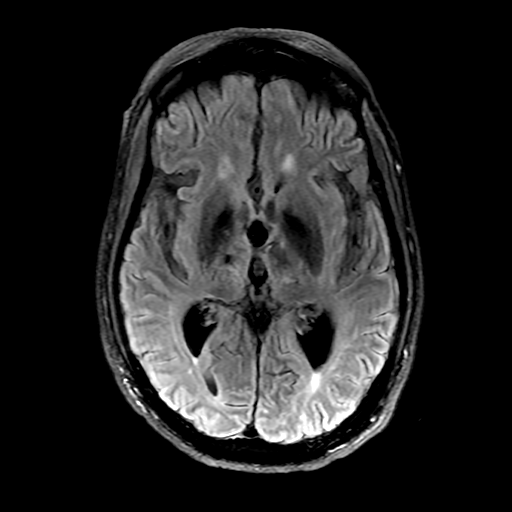
[im 35/35]
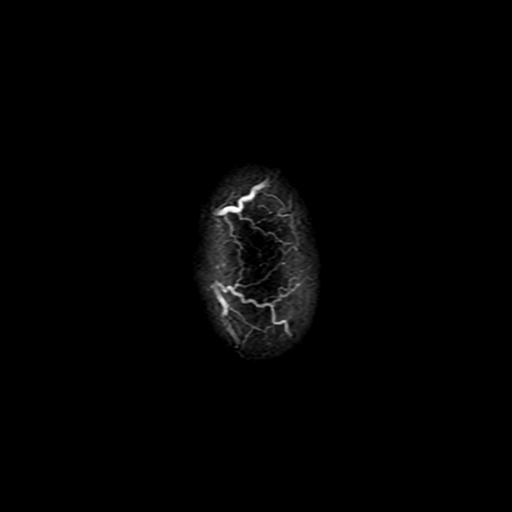

[Series 250: ADC · axial · 3.0mm · 0.94mm/px · z∈[-101,+38]mm · 5 of 49 slices shown (1 of 2)]
[im 1/49]
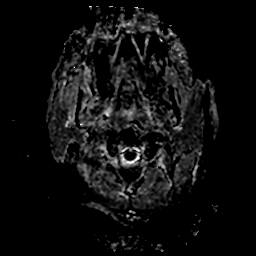
[im 13/49]
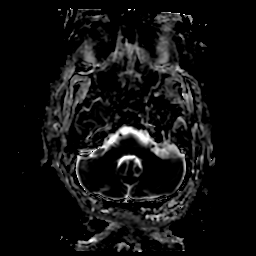
[im 25/49]
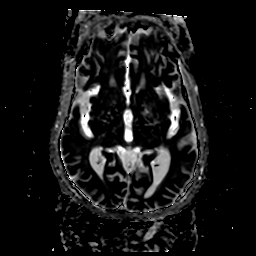
[im 37/49]
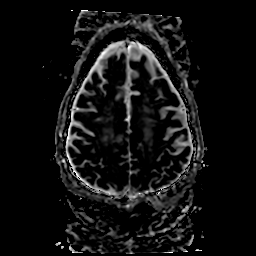
[im 49/49]
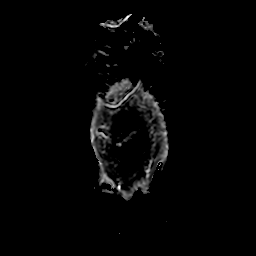

[Series 350: ADC · coronal · 4.0mm · 0.94mm/px · 3 of 36 slices shown (2 of 2)]
[im 1/36]
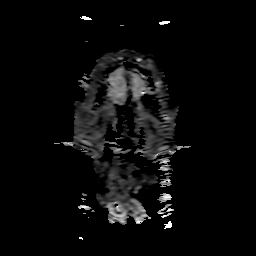
[im 18/36]
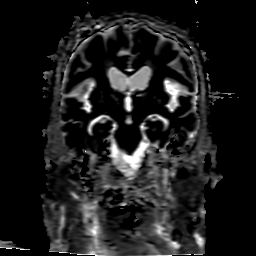
[im 36/36]
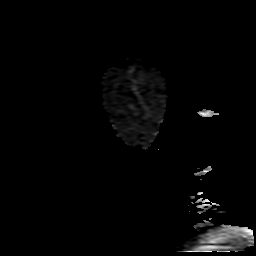

[28 of 48 positions shown; findings below may reference images not displayed]

FINDINGS: Brain:

Mild-to-moderate generalized cerebral atrophy. Comparatively mild
cerebellar atrophy.

2 mm acute cortical infarct within the right frontal operculum
(series 2, image 24).

Punctate acute infarct within the genu of the right internal capsule
(series 2, image 23).

Punctate acute infarct within the right frontoparietal
periventricular white matter (series 2, image 18).

3 mm acute infarct within the posterior right frontal lobe
subcortical white matter (series 2, image 14).

6 mm focus of diffusion-weighted signal hyperintensity within the
right frontoparietal periventricular white matter (series 2, image
14). There is corresponding T2 shine through at this site on the ADC
map, and this likely reflects a subacute infarct.

Apparent restricted diffusion within the bilateral thalami likely
reflects artifact from chronic blood products at these sites.

Chronic lacunar infarcts within the bilateral cerebral hemispheric
white matter. Background moderate multifocal T2 FLAIR hyperintense
signal abnormality within the cerebral white matter, nonspecific but
compatible with chronic small vessel ischemic disease. Mild chronic
small vessel ischemic changes are also present within the pons.

Chronic lacunar infarcts within the bilateral thalami, some with
associated chronic blood products.

Fairly numerous supratentorial and infratentorial chronic
parenchymal microhemorrhages with a deep gray nuclei, brainstem and
cerebellar predominance.

No evidence of an intracranial mass.

No extra-axial fluid collection.

No midline shift.

Vascular: Maintained flow voids within the proximal large arterial
vessels.

Skull and upper cervical spine: No focal suspicious marrow lesion.

Sinuses/Orbits: Visualized orbits show no acute finding. Minimal
diffuse paranasal sinus mucosal thickening.

Other: Small bilateral mastoid effusions.
IMPRESSION: Subcentimeter acute infarcts within the right frontal operculum,
genu of right internal capsule, right frontoparietal periventricular
white matter, and posterior right frontal lobe subcortical white
matter as described.

6 mm subacute infarct within right frontoparietal periventricular
white matter.

Background moderate chronic small vessel ischemic changes within the
cerebral white matter, including chronic lacunar infarcts. Mild
chronic small vessel ischemic changes also present within the pons.

Chronic lacunar infarcts within the bilateral thalami.

Fairly numerous supratentorial and infratentorial chronic
parenchymal microhemorrhages. The distribution suggests sequela of
chronic hypertensive microangiopathy.

Mild-to-moderate generalized cerebral atrophy. Comparatively mild
cerebellar atrophy.

Small bilateral mastoid effusions.

## 2021-11-16 MED ORDER — POTASSIUM CHLORIDE 20 MEQ PO PACK
40.0000 meq | PACK | Freq: Two times a day (BID) | ORAL | Status: AC
  Administered 2021-11-16 (×2): 40 meq
  Filled 2021-11-16 (×2): qty 2

## 2021-11-16 MED ORDER — HYDRALAZINE HCL 50 MG PO TABS: 50.0000 mg | ORAL_TABLET | Freq: Three times a day (TID) | ORAL | Status: DC

## 2021-11-16 MED ORDER — HYDRALAZINE HCL 50 MG PO TABS
50.0000 mg | ORAL_TABLET | Freq: Three times a day (TID) | ORAL | Status: DC
  Administered 2021-11-16 – 2021-11-17 (×2): 50 mg
  Filled 2021-11-16 (×2): qty 1

## 2021-11-16 MED ORDER — CARVEDILOL 3.125 MG PO TABS: 3.1250 mg | ORAL_TABLET | Freq: Two times a day (BID) | ORAL | Status: DC

## 2021-11-16 MED ORDER — LABETALOL HCL 5 MG/ML IV SOLN
10.0000 mg | INTRAVENOUS | Status: DC | PRN
  Filled 2021-11-16: qty 4

## 2021-11-16 MED ORDER — LABETALOL HCL 5 MG/ML IV SOLN
10.0000 mg | INTRAVENOUS | Status: DC | PRN
  Administered 2021-11-17 – 2021-11-19 (×4): 10 mg via INTRAVENOUS
  Administered 2021-11-19: 20 mg via INTRAVENOUS
  Filled 2021-11-16 (×5): qty 4

## 2021-11-16 MED ORDER — HEPARIN SODIUM (PORCINE) 5000 UNIT/ML IJ SOLN
5000.0000 [IU] | Freq: Three times a day (TID) | INTRAMUSCULAR | Status: DC
  Administered 2021-11-16 – 2021-12-05 (×49): 5000 [IU] via SUBCUTANEOUS
  Filled 2021-11-16 (×52): qty 1

## 2021-11-16 MED ORDER — CARVEDILOL 3.125 MG PO TABS
3.1250 mg | ORAL_TABLET | Freq: Two times a day (BID) | ORAL | Status: DC
  Administered 2021-11-16 (×2): 3.125 mg
  Filled 2021-11-16 (×2): qty 1

## 2021-11-16 MED ORDER — LACTATED RINGERS IV SOLN: INTRAVENOUS | Status: DC

## 2021-11-16 MED ORDER — PANTOPRAZOLE 2 MG/ML SUSPENSION
40.0000 mg | Freq: Every day | ORAL | Status: DC
  Administered 2021-11-16 – 2021-11-17 (×2): 40 mg
  Filled 2021-11-16 (×2): qty 20

## 2021-11-16 MED ORDER — INSULIN DETEMIR 100 UNIT/ML ~~LOC~~ SOLN
5.0000 [IU] | Freq: Two times a day (BID) | SUBCUTANEOUS | Status: DC
  Administered 2021-11-16 – 2021-11-21 (×9): 5 [IU] via SUBCUTANEOUS
  Filled 2021-11-16 (×13): qty 0.05

## 2021-11-16 MED ORDER — CLONIDINE HCL 0.2 MG PO TABS
0.2000 mg | ORAL_TABLET | Freq: Three times a day (TID) | ORAL | Status: DC
  Administered 2021-11-16 – 2021-11-17 (×5): 0.2 mg
  Filled 2021-11-16 (×5): qty 1

## 2021-11-16 NOTE — Progress Notes (Signed)
NAME:  Manuel Kramer, MRN:  [id number], DOB:  12/17/1955, LOS: 2 ADMISSION DATE:  11/14/2021, CONSULTATION DATE:  1/16 REFERRING MD:  Dr. Eudelia Bunch, CHIEF COMPLAINT:  encephalopathy; fever; htn emergency   History of Present Illness:  Patient is a 66 year old male with pertinent PMH tobacco abuse, DMT2, HTN presents to [facility] ED on 1/16 after being found unresponsive.  On 1/16, EMS called to patient's house at 1:30 AM after medical alarm going off.  Police had to break into the home to get inside and upon entry, found patient in bed unresponsive.  Patient BP initially 200/100.  EMS states patient was incoherently combative with right eye deviation.  Elevated CBG.  Brought to [facility] ED.  Upon arrival to [facility] ED, patient hypertensive, fever 101.4 F, and unresponsive.  Patient intubated for airway protection.  CT and CTA head negative. Started on cardene. Neuro consulted. Recommend starting EEG. Keppra started.   Lactic acid 4.6.  VBG 7.47, 39, 45, 29.  NA 132.  Glucose 533.  Creat 1.68.  WBC 12.3.  Troponin 30.  Ethanol and ammonia WNL.  UA clean. UDS positive for benzos though collected after RSI.   PCCM consulted for ICU admission.  Pertinent  Medical History   Past Medical History:  Diagnosis Date   Chronic diarrhea    Diabetes mellitus without complication (HCC)    Hypertension    Significant Hospital Events: Including procedures, antibiotic start and stop dates in addition to other pertinent events   1/16: admitted to [facility]; unresponsive; intubated for airway protection; on cleviprex, LTM, LP, vanc/ cefepime and acyclovir added, worsening renal function 1/17:  no seizures, LTM stopped, ongoing cleviprex, UOP down/ sCr up, febrile  Interim History / Subjective:  Neuro change yesterday evening- repeat CTH neg On cEEG overnight-> no seizures (was on propofol for several hours overnight, otherwise off sedation)  Pending MRI brain today   Objective   Blood pressure (!) 166/70, pulse 82,  temperature 99.4 F (37.4 C), temperature source Axillary, resp. rate (!) 23, height 5\' 6"  (1.676 m), weight 61 kg, SpO2 100 %.    Vent Mode: PSV;CPAP FiO2 (%):  [30 %] 30 % Set Rate:  [12 bmp] 12 bmp Vt Set:  [510 mL] 510 mL PEEP:  [5 cmH20] 5 cmH20 Pressure Support:  [5 cmH20-8 cmH20] 8 cmH20 Plateau Pressure:  [12 cmH20-17 cmH20] 17 cmH20   Intake/Output Summary (Last 24 hours) at 11/16/2021 0947 Last data filed at 11/16/2021 0600 Gross per 24 hour  Intake 4382.49 ml  Output 500 ml  Net 3882.49 ml   Filed Weights   11/14/21 0650 11/15/21 0500 11/16/21 0500  Weight: 60 kg 61.1 kg 61 kg   Examination: Propofol off since 0530 General:  thin older male on MV in NAD HEENT: MM pink/moist, pupils 5/ reactive- initially disconjugate gaze but then normalized, at times does not blink to threat, +JVD Neuro:  does not f/c, eyes opened slightly - doesn't always blink to threat, no movement to noxious stimuli but when suctioned will spont withdrawal legs and move arms but not purposeful or localizing  CV: rr, NSR, no murmur PULM:  currently on PSV 8/5 doing well, coarse, moderate yellow/white secretions GI:  soft, bs+, foley, rectal pouch Extremities: warm/dry, no LE edema  Skin: no rashes   Labs reviewed> K 3, glucose 217, sCr 1.83 > 2 > 2.06, WBC 14.7 > 12.9, Hgb today 10.3 (trending 10 to 12)   LP 1/16 > WBC 16, neutrophils 94%, protein 44, glucose  136, GS neg LP 1/16  enterovirus neg, culture, HSV, and VZV pending     Assessment & Plan:   Acute metabolic encephalopathy  - unclear etiology at this point ddx include seizure vs CNS process vs HHS vs PRES? -  CTA/ perfusion- no LVO or perfusion deficit - UDS +benzo,  Ammonia 51 > 15 P:  - Neurology following, appreciate recs - pending LP HSV, VZV, and culture pending - continue vanc, cefepime, and acyclovir pending culture data  - cEEG neg overnight, going for MRI today  - seizures precautions - Keppra given 1/16, further AEDs  per Neurology  - Maintain neuro protective measures; goal for eurothermia, euglycemia, eunatermia, normoxia, and PCO2 goal of 35-40 - minimizing sedation as able, RASS goal 0, precedex/ fentanyl prn w/ bowel regimen   Acute hypoxic respiratory failure with inability to protect airway - 2/2  above. - Full MV support - Weaning today but mental status is barrier to extubation - minimize sedation as able  - VAP/ PPI   Sepsis - unclear etiology thus far - continue empiric broad spectrum abx pending culture data  - trend CBC/ fever curve  - tylenol prn fever   HTN emergency - continue Cleviprex, goal SBP < 160 - continue norvasc 10mg , clonidine 0.2 mg TID (increased 1/17), adding coreg 3.125mg  BID today  - holding home Lisinopril with AKI    AKI, likely multifactorial in the setting of HTN emergency, home ACEi, s/p IV dye, and still needing vanc/ acyclovir at this time  - UOP lower but steady, stable sCr today  - continue MIVF for additional 24 hr then reassess  - renal ultrasound neg, FeNa is pre-renal  - continue foley for now - trend BMP / urinary output/ strict I/Os - renal dose meds per pharmacy    Hypokalemia  - additional KCL replete today  - Mag 2.1   HHS with hx underlying DM. - continue SSI sensitive / CBG q 4 - add levemir 5 unit BID   Protein calorie malnutrition  - TF   Best Practice (right click and "Reselect all SmartList Selections" daily)   Diet/type: NPO,  TF DVT prophylaxis: prophylactic heparin  GI prophylaxis: PPI Lines: N/A Foley:  Yes, and it is still needed Code Status:  full code Last date of multidisciplinary goals of care discussion: pending. Daughter and son remain primary NOK.  Is still married but separated for 7642yrs.   Pending update 1/18.   Labs   CBC: Recent Labs  Lab 11/14/21 0420 11/15/21 0330 11/15/21 0535 11/15/21 0632 11/16/21 0721  WBC 12.3*  --   --  14.7* 12.9*  NEUTROABS 10.8*  --   --   --   --   HGB 13.8 10.2*  11.9* 12.6* 10.3*  HCT 41.1 30.0* 35.0* 36.0* 30.8*  MCV 90.5  --   --  87.8 90.6  PLT 214  --   --  215 205    Basic Metabolic Panel: Recent Labs  Lab 11/14/21 0418 11/14/21 0420 11/14/21 1219 11/15/21 0330 11/15/21 0535 11/15/21 0632 11/16/21 0721  NA 134*   133* 132* 136 138 137 140 137  K 3.8   3.8 4.0 2.8* 2.3* 3.4* 3.0* 3.0*  CL 96* 97* 102  --   --  104 106  CO2  --  24 21*  --   --  23 21*  GLUCOSE 531* 533* 183*  --   --  111* 217*  BUN 16 14 14   --   --  17 24*  CREATININE 1.40* 1.68* 1.83*  --   --  2.05* 2.06*  CALCIUM  --  8.6* 8.4*  --   --  8.7* 8.3*  MG  --   --   --   --   --  1.8 2.1  PHOS  --   --   --   --   --  3.0 4.1   GFR: Estimated Creatinine Clearance: 31.3 mL/min (A) (by C-G formula based on SCr of 2.06 mg/dL (H)). Recent Labs  Lab 11/14/21 0418 11/14/21 0420 11/14/21 0721 11/15/21 0632 11/16/21 0721  WBC  --  12.3*  --  14.7* 12.9*  LATICACIDVEN 4.6*  --  4.7*  --   --     Liver Function Tests: Recent Labs  Lab 11/14/21 0420  AST 22  ALT 23  ALKPHOS 97  BILITOT 1.0  PROT 5.9*  ALBUMIN 3.0*   No results for input(s): LIPASE, AMYLASE in the last 168 hours. Recent Labs  Lab 11/14/21 0702 11/16/21 0721  AMMONIA 51* 15    ABG    Component Value Date/Time   PHART 7.448 11/15/2021 0535   PCO2ART 34.4 11/15/2021 0535   PO2ART 106 11/15/2021 0535   HCO3 23.9 11/15/2021 0535   TCO2 25 11/15/2021 0535   O2SAT 98.0 11/15/2021 0535     Coagulation Profile: Recent Labs  Lab 11/14/21 0420  INR 0.8    Cardiac Enzymes: No results for input(s): CKTOTAL, CKMB, CKMBINDEX, TROPONINI in the last 168 hours.  HbA1C: Hemoglobin A1C  Date/Time Value Ref Range Status  05/22/2016 11:16 AM 9.8  Final  07/01/2015 11:50 AM 10.60  Final   Hgb A1c MFr Bld  Date/Time Value Ref Range Status  11/14/2021 12:19 PM 9.9 (H) 4.8 - 5.6 % Final    Comment:    (NOTE) Pre diabetes:          5.7%-6.4%  Diabetes:              >6.4%  Glycemic  control for   <7.0% adults with diabetes   08/17/2007 04:12 AM   Final   5.0 (NOTE)   The ADA recommends the following therapeutic goals for glycemic   control related to Hgb A1C measurement:   Goal of Therapy:   < 7.0% Hgb A1C   Action Suggested:  > 8.0% Hgb A1C   Ref:  Diabetes Care, 22, Suppl. 1, 1999    CBG: Recent Labs  Lab 11/15/21 1602 11/15/21 1951 11/15/21 2353 11/16/21 0405 11/16/21 0730  GLUCAP 172* 169* 182* 180* 197*     Critical care time: 30 mins    Posey Boyer, ACNP [facility] 11/16/2021, 9:47 AM  See Amion for pager If no response to pager, please call PCCM consult pager After 7:00 pm call Elink

## 2021-11-16 NOTE — Progress Notes (Signed)
Pt transported to and from MRI on the ventilator without incident. 

## 2021-11-16 NOTE — Procedures (Signed)
EEG Procedure CPT/Type of Study: 95720; 24hr EEG with video Referring Provider: Hunsucker Primary Neurological Diagnosis: AMS  History: This is a 66 yr old patient, undergoing an EEG to evaluate for acute encephalopathy. Clinical State: comatose  Technical Description:  The EEG was performed using standard setting per the guidelines of American Clinical Neurophysiology Society (ACNS).  A minimum of 21 electrodes were placed on scalp according to the International 10-20 or/and 10-10 Systems. Supplemental electrodes were placed as needed. Single EKG electrode was also used to detect cardiac arrhythmia. Patient's behavior was continuously recorded on video simultaneously with EEG. A minimum of 16 channels were used for data display. Each epoch of study was reviewed manually daily and as needed using standard referential and bipolar montages. Computerized quantitative EEG analysis (such as compressed spectral array analysis, trending, automated spike & seizure detection) were used as indicated.   Day 2: from 0730 11/15/21 to 1150 11/15/21, 1816 11/15/21 to 0730 11/16/21  EEG Description: Overall Amplitude: suppressed Predominant Frequency: The background activity showed delta-theta activity with 1-3Hz  background Superimposed Frequencies: none The background was symmetric  Background Abnormalities: Background suppression with rare low voltage slow activity Rhythmic or periodic pattern: GRDA, runs of 1-2Hz  GRDA with arousal Epileptiform activity: no Electrographic seizures: no Events: no   Breach rhythm: no  Reactivity: present with atypical arousal patterns  Stimulation procedures:  Hyperventilation: not done Photic stimulation: not done  Sleep Background: none  EKG:no significant arrhythmia  Impression: This was an abnormal continuous video EEG due to diffuse slowing with atypical arousal patterns and some runs of GRDA, indicative of a severe encephalopathy pattern. No seizures were  seen.

## 2021-11-16 NOTE — Progress Notes (Signed)
LTM EEG discontinued - no skin breakdown at unhook.   

## 2021-11-16 NOTE — Progress Notes (Signed)
Neurology Progress Note   Subjective: [name] is a 66 year old male with PMHx of T2DM, HTN, who was BIBEMS unresponsive for an unknown down time, after neighbors reported his medical alarm went off and consulted to the neurology service for evaluation of unresponsiveness. Improved from yesterday, [name] does open his eyes to pain or uncomfortable stimuli (q-tip in the nares), although he does not track faces well, and continues have some meaningful limb movements.  Objective: Exam: Vitals:   11/16/21 0736 11/16/21 0800  BP: (!) 166/70   Pulse: 82   Resp: (!) 23   Temp:  99.4 F (37.4 C)  SpO2: 100%     Physical Exam  Constitutional: Appears well-developed and well-nourished.  Psych: comatose Eyes: No scleral injection HENT: No OP obstrucion MSK: no joint deformities.  Cardiovascular: Normal rate and regular rhythm.  Respiratory: Effort normal, non-labored breathing GI: Soft. No distension. Skin: WDI  Neuro: Mental Status: Patient is comatose. Cranial Nerves: II: Pupils are equal, round, and reactive to light.   III,IV, VI: EOMI without ptosis or diploplia.  V, VII: Corneal reflex intact in L eye, patient voluntarily shuts R eye tightly as this writer attempts to open  VIII: Opens eyes to loud voices IX, X: Cough and gag reflex intact XII: Tongue protrudes midline without atrophy or fasciculations.  Motor: Tone is normal. Bulk is normal. Withdraws BLE to pain, but not BUE; Meaningful movements of BUE to touch nose when nares stimulated. Sensory: Sensation is symmetric to pain in BLE. Deep Tendon Reflexes: 2+ and symmetric in the biceps, Achilles, and patellae. Plantars: Toes are downgoing bilaterally.      Pertinent Labs:  Pathologist smear review     Component 2 d ago  Path Review The cellularity consists mostly of acute inflammatory cells.   Comment: Reviewed by Napoleon Form, MD.      HSV and VZV CSF in process  Enterovirus pcr  0 Result Notes      Component Ref Range & Units 2 d ago  Enterovirus PCR Negative Negative   Comment: (NOTE)      CBC  0 Result Notes           Component Ref Range & Units 07:21 (11/16/21) 1 d ago (11/15/21) 1 d ago (11/15/21) 1 d ago (11/15/21) 2 d ago (11/14/21) 2 d ago (11/14/21) 2 d ago (11/14/21)  WBC 4.0 - 10.5 K/uL 12.9 High   14.7 High     12.3 High      RBC 4.22 - 5.81 MIL/uL 3.40 Low   4.10 Low     4.54     Hemoglobin 13.0 - 17.0 g/dL 62.8 Low   36.6 Low   29.4 Low   10.2 Low   13.8  15.0  14.3   HCT 39.0 - 52.0 % 30.8 Low   36.0 Low   35.0 Low   30.0 Low   41.1  44.0  42.0   MCV 80.0 - 100.0 fL 90.6  87.8    90.5     MCH 26.0 - 34.0 pg 30.3  30.7    30.4     MCHC 30.0 - 36.0 g/dL 76.5  46.5    03.5     RDW 11.5 - 15.5 % 12.3  12.1    11.9     Platelets 150 - 400 K/uL 205  215    214     nRBC 0.0 - 0.2 % 0.0  0.0 CM    0.0  Basic metabolic panel  0 Result Notes            Component Ref Range & Units 07:21 (11/16/21) 1 d ago (11/15/21) 1 d ago (11/15/21) 1 d ago (11/15/21) 2 d ago (11/14/21) 2 d ago (11/14/21) 2 d ago (11/14/21) 2 d ago (11/14/21)  Sodium 135 - 145 mmol/L 137  140  137  138  136  132 Low   134 Low   133 Low    Potassium 3.5 - 5.1 mmol/L 3.0 Low   3.0 Low   3.4 Low   2.3 Low Panic   2.8 Low   4.0  3.8  3.8   Chloride 98 - 111 mmol/L 106  104    102  97 Low   96 Low     CO2 22 - 32 mmol/L 21 Low   23    21 Low   24     Glucose, Bld 70 - 99 mg/dL 185 High   631 High  CM    183 High  CM  533 High Panic  CM  531 High Panic  CM    Comment: Glucose reference range applies only to samples taken after fasting for at least 8 hours.  BUN 8 - 23 mg/dL 24 High   17    14  14  16     Creatinine, Ser 0.61 - 1.24 mg/dL High   4.97 High     1.83 High   1.68 High   1.40 High     Calcium 8.9 - 10.3 mg/dL 8.3 Low   8.7 Low     8.4 Low   8.6 Low      GFR, Estimated >60 mL/min 35 Low   36 Low  CM    41 Low  CM  45 Low  CM     Comment: (NOTE)      TG 219 CSF Glucose 136 CSF  cell count: WBC 16, Segmented neutrophils 94%, Lymphocytes 6% CSF cx No growth x 48 hours A1c 9.9% BH Acid 1.08 Blood cx No growth x 2 days  Imaging Reviewed: EEG restarted at 1816 on 1/17- 0730 on 1/18: Abnormal due to diffuse slowing with atypical arousal patterns and some runs of GRDA, indicative of a severe encephalopathy pattern. No seizures were seen. Reactivity now present with atypical arousal patterns.  Assessment: 66 year old male evaluated for unresponsiveness. On EEG, some reactivity noted. Patient again with purposeful movements and withdrawing to pain. Unknown time down, so there is concern for an anoxic brain injury. Patient with diffuse slowing on EEG, c/w encephalopathy that is likely multifactorial 2/2 HTN, hyperglycemia, and a potentially provoked seizure. Cannot yet rule out stroke without further imaging.  As for the neutrophilic pleocytosis on L,  could be consistent with a potential viral meningitis vs reactive after a seizure.  Recommendations: 1)Obtain MRI, and continue cEEG  2) Wean sedation as tolerated 3)Continue Acyclovir 600 mg q12h while awaiting CSF HSV and VZV results   Neurology will continue to follow for further recs after imaging and once patient is better able to assess off of sedation.   Pt seen by Neuro Psych Resident and later by attending MD. Note/plan to be edited by MD as needed.    78, MD PGY-1 11/16/2021  10:00 AM

## 2021-11-17 ENCOUNTER — Inpatient Hospital Stay (HOSPITAL_COMMUNITY): Payer: Medicaid Other

## 2021-11-17 DIAGNOSIS — G9340 Encephalopathy, unspecified: Secondary | ICD-10-CM | POA: Diagnosis not present

## 2021-11-17 DIAGNOSIS — I6340 Cerebral infarction due to embolism of unspecified cerebral artery: Secondary | ICD-10-CM | POA: Insufficient documentation

## 2021-11-17 LAB — BASIC METABOLIC PANEL
Anion gap: 8 (ref 5–15)
BUN: 24 mg/dL — ABNORMAL HIGH (ref 8–23)
CO2: 23 mmol/L (ref 22–32)
Calcium: 8.4 mg/dL — ABNORMAL LOW (ref 8.9–10.3)
Chloride: 110 mmol/L (ref 98–111)
Creatinine, Ser: 1.83 mg/dL — ABNORMAL HIGH (ref 0.61–1.24)
GFR, Estimated: 41 mL/min — ABNORMAL LOW (ref >60)
Glucose, Bld: 101 mg/dL — ABNORMAL HIGH (ref 70–99)
Potassium: 3.7 mmol/L (ref 3.5–5.1)
Sodium: 141 mmol/L (ref 135–145)

## 2021-11-17 LAB — GLUCOSE, CAPILLARY
Glucose-Capillary: 93 mg/dL (ref 70–99)
Glucose-Capillary: 102 mg/dL — ABNORMAL HIGH (ref 70–99)
Glucose-Capillary: 193 mg/dL — ABNORMAL HIGH (ref 70–99)
Glucose-Capillary: 159 mg/dL — ABNORMAL HIGH (ref 70–99)
Glucose-Capillary: 185 mg/dL — ABNORMAL HIGH (ref 70–99)
Glucose-Capillary: 195 mg/dL — ABNORMAL HIGH (ref 70–99)

## 2021-11-17 LAB — CBC
HCT: 32.4 % — ABNORMAL LOW (ref 39.0–52.0)
Hemoglobin: 11 g/dL — ABNORMAL LOW (ref 13.0–17.0)
MCH: 31 pg (ref 26.0–34.0)
MCHC: 34 g/dL (ref 30.0–36.0)
MCV: 91.3 fL (ref 80.0–100.0)
Platelets: 197 10*3/uL (ref 150–400)
RBC: 3.55 MIL/uL — ABNORMAL LOW (ref 4.22–5.81)
RDW: 12.6 % (ref 11.5–15.5)
WBC: 10.5 10*3/uL (ref 4.0–10.5)
nRBC: 0 % (ref 0.0–0.2)

## 2021-11-17 LAB — HSV 1/2 PCR, CSF
HSV-1 DNA: NEGATIVE
HSV-2 DNA: NEGATIVE

## 2021-11-17 LAB — PHOSPHORUS
Phosphorus: 4 mg/dL (ref 2.5–4.6)
Phosphorus: 3.5 mg/dL (ref 2.5–4.6)

## 2021-11-17 LAB — MAGNESIUM
Magnesium: 2.2 mg/dL (ref 1.7–2.4)
Magnesium: 2 mg/dL (ref 1.7–2.4)

## 2021-11-17 IMAGING — MR MR MRA HEAD W/O CM
1 series · 19 of 48 positions shown · non-contrast
Comparison: Brain MRI from [DATE].

CLINICAL DATA: Follow-up examination for acute stroke.

EXAM:
MRA HEAD WITHOUT CONTRAST
TECHNIQUE: Angiographic images of the Circle of Willis were acquired using MRA
technique without intravenous contrast.

[Series 5: 3d cow · axial · 0.5mm · 0.41mm/px · z∈[-109,-32]mm · 19 of 172 slices shown]
[im 1/172]
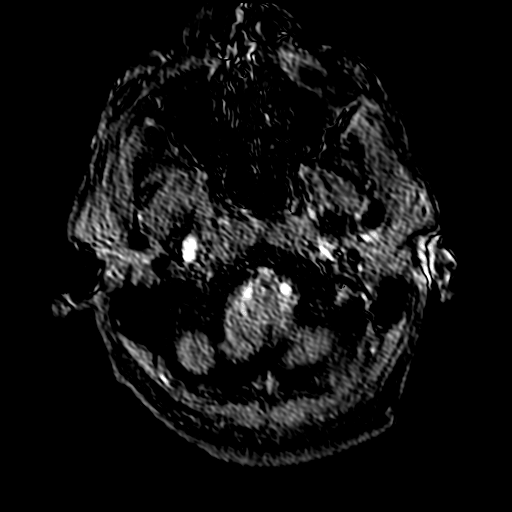
[im 4/172]
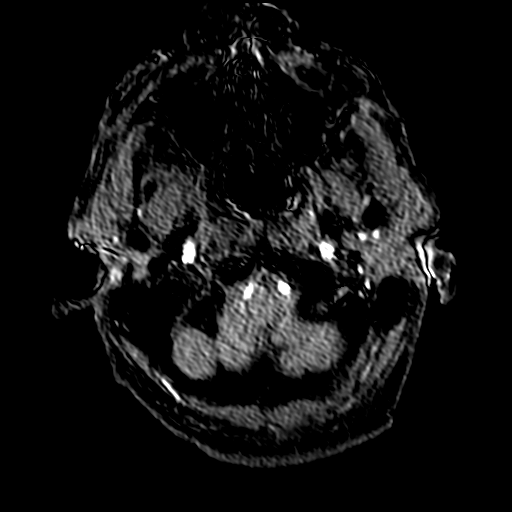
[im 8/172]
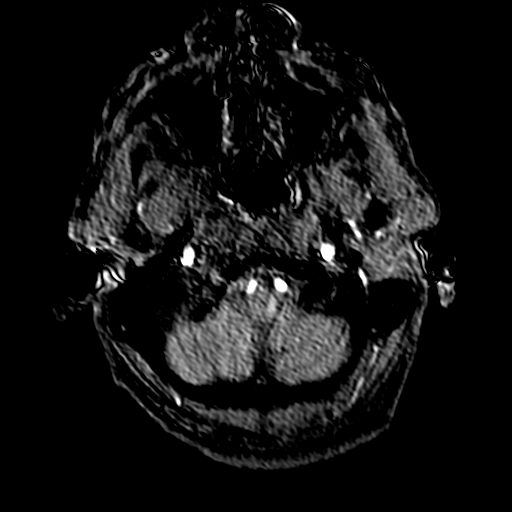
[im 11/172]
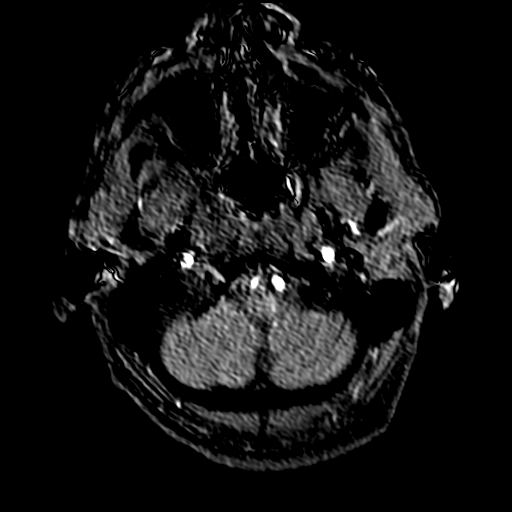
[im 15/172]
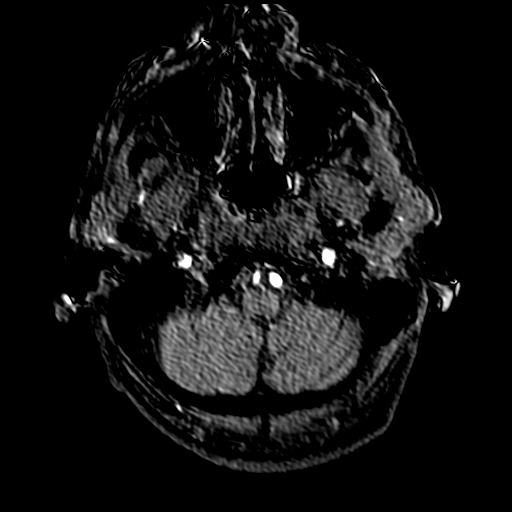
[im 19/172]
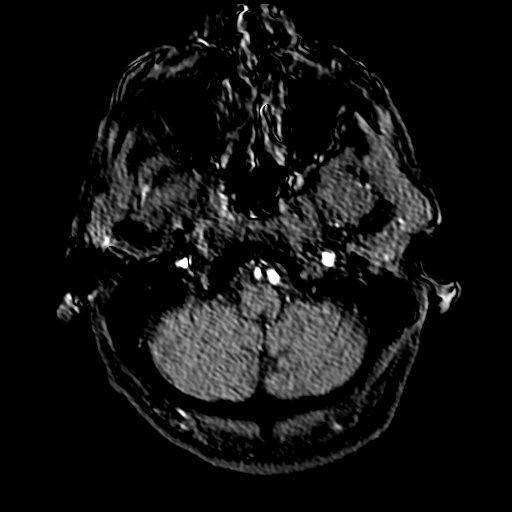
[im 22/172]
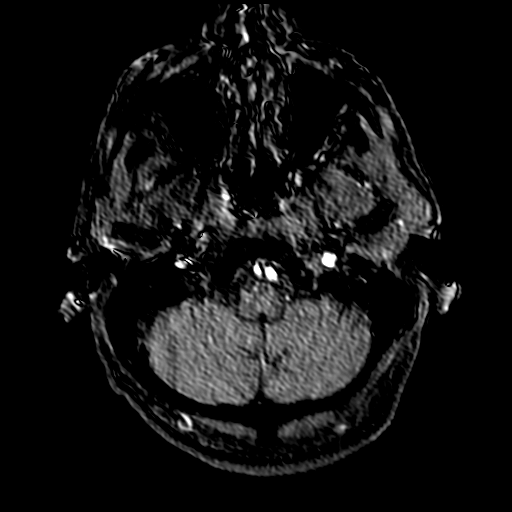
[im 26/172]
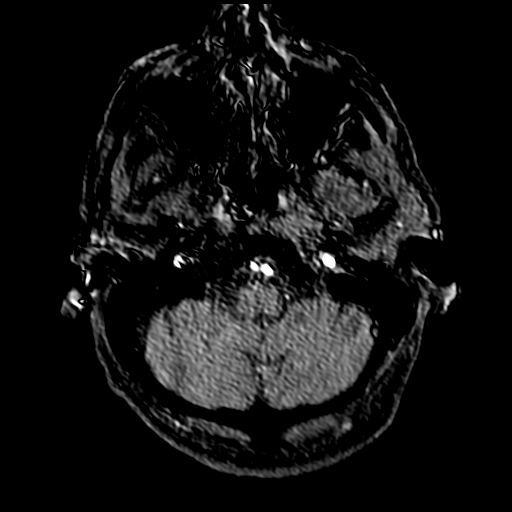
[im 30/172]
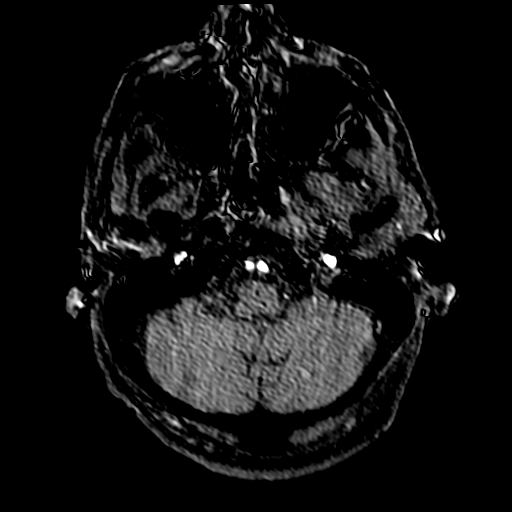
[im 33/172]
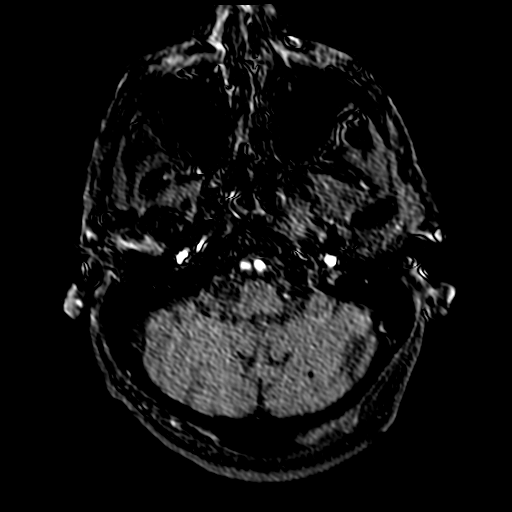
[im 37/172]
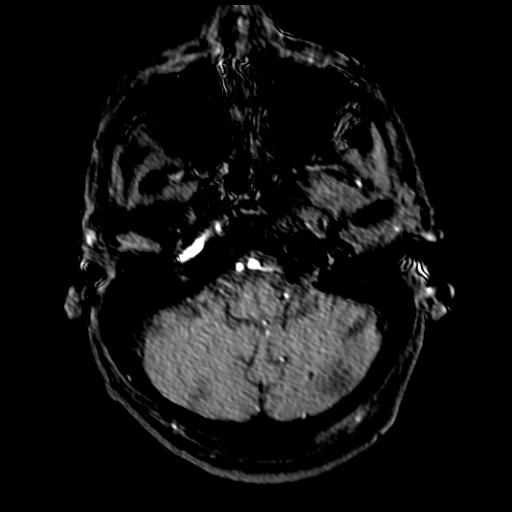
[im 55/172]
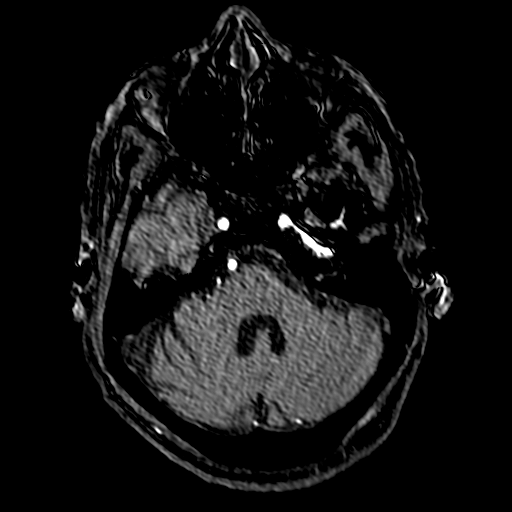
[im 77/172]
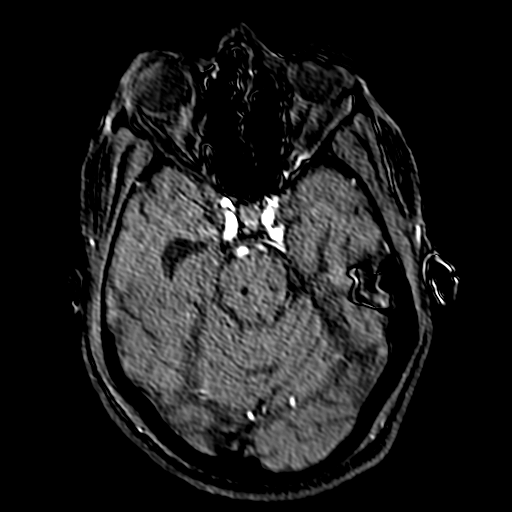
[im 88/172]
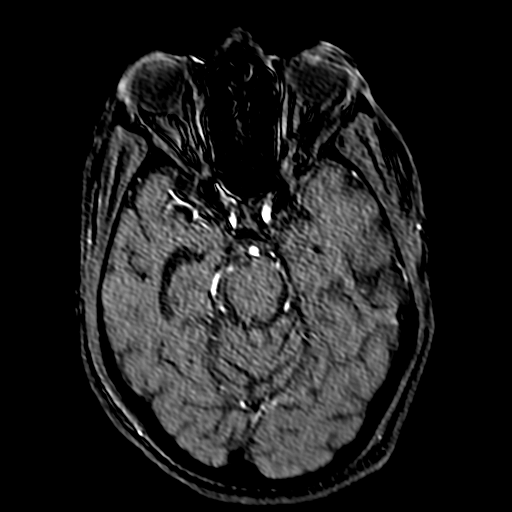
[im 99/172]
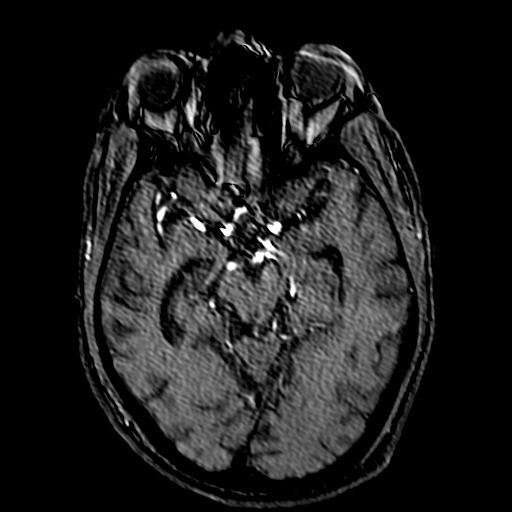
[im 121/172]
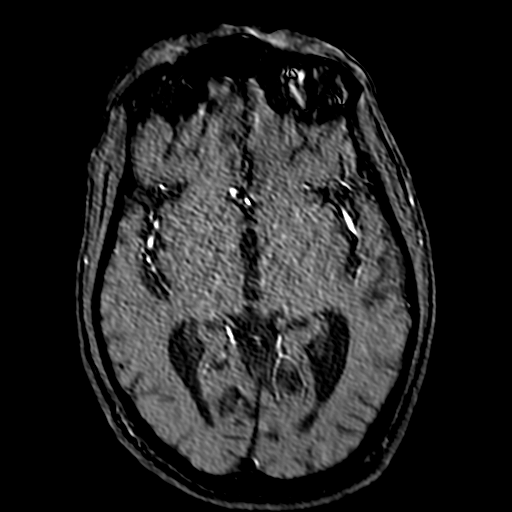
[im 142/172]
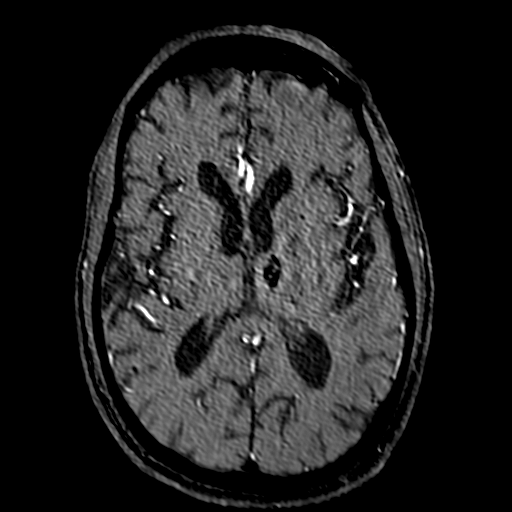
[im 146/172]
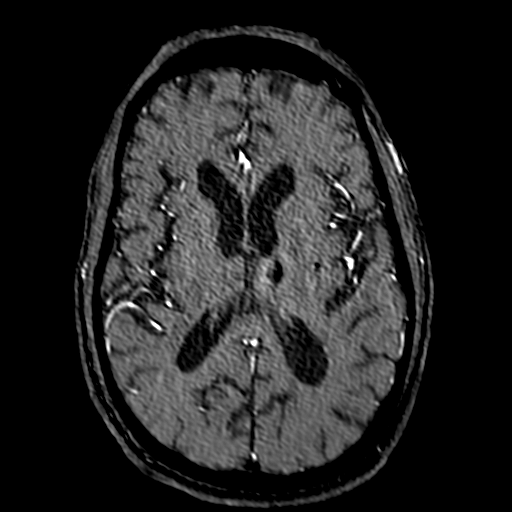
[im 164/172]
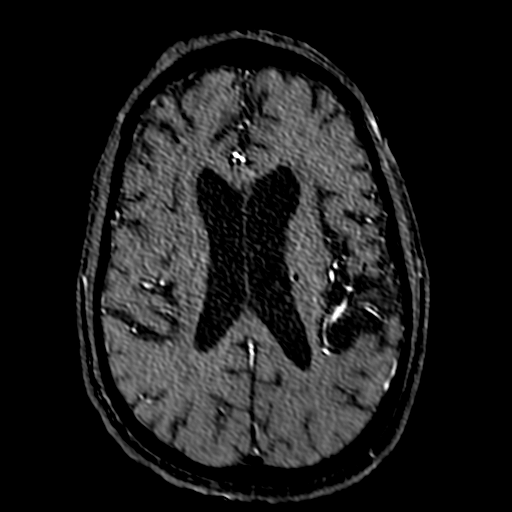

[19 of 48 positions shown; findings below may reference images not displayed]

FINDINGS: Anterior circulation: Examination moderately degraded by motion
artifact.

Visualized distal cervical segments of the internal carotid arteries
are patent with antegrade flow. Apparent stenoses/defects at the
cervical petrous junction favored to be artifactual. Petrous
segments widely patent. Probable atheromatous irregularity within
the carotid siphons with no visible hemodynamically significant
stenosis or other abnormality. A1 segments patent bilaterally.
Grossly normal anterior communicating artery complex. Anterior
cerebral arteries grossly patent and well perfused to their distal
aspects without appreciable stenosis. No visible M1 stenosis or
occlusion. Normal MCA bifurcations. Distal MCA branches perfused and
symmetric.

Posterior circulation: Visualized distal V4 segments patent without
stenosis. Left vertebral artery dominant. Left PICA patent. Right
PICA origin not seen. Basilar patent to its distal aspect without
appreciable stenosis. Superior cerebellar arteries patent
bilaterally. Both PCAs primarily supplied via the basilar and are
well perfused or distal aspects.

Anatomic variants: None significant.

Other: No visible aneurysm or other vascular malformation on this
motion degraded exam.
IMPRESSION: 1. Motion degraded exam.
2. No large vessel occlusion.
3. Probable atheromatous change involving the carotid siphons
without significant stenosis. No hemodynamically significant or
correctable stenosis seen on this motion degraded exam.

## 2021-11-17 MED ORDER — ADULT MULTIVITAMIN W/MINERALS CH
1.0000 | ORAL_TABLET | Freq: Every day | ORAL | Status: DC
  Administered 2021-11-18 – 2021-12-05 (×17): 1 via ORAL
  Filled 2021-11-17 (×17): qty 1

## 2021-11-17 MED ORDER — ASPIRIN 300 MG RE SUPP: 300.0000 mg | Freq: Every day | RECTAL | Status: DC

## 2021-11-17 MED ORDER — HYDRALAZINE HCL 50 MG PO TABS
75.0000 mg | ORAL_TABLET | Freq: Three times a day (TID) | ORAL | Status: DC
  Administered 2021-11-17: 75 mg
  Filled 2021-11-17: qty 1

## 2021-11-17 MED ORDER — ORAL CARE MOUTH RINSE
15.0000 mL | Freq: Two times a day (BID) | OROMUCOSAL | Status: DC
  Administered 2021-11-18 – 2021-12-05 (×22): 15 mL via OROMUCOSAL

## 2021-11-17 MED ORDER — HYDRALAZINE HCL 25 MG PO TABS
25.0000 mg | ORAL_TABLET | Freq: Once | ORAL | Status: AC
  Administered 2021-11-17: 25 mg
  Filled 2021-11-17: qty 1

## 2021-11-17 MED ORDER — ASPIRIN 81 MG PO CHEW: 81.0000 mg | CHEWABLE_TABLET | Freq: Every day | ORAL | Status: DC

## 2021-11-17 MED ORDER — ASPIRIN 81 MG PO CHEW
81.0000 mg | CHEWABLE_TABLET | Freq: Every day | ORAL | Status: DC
  Administered 2021-11-17 – 2021-12-05 (×19): 81 mg via ORAL
  Filled 2021-11-17 (×19): qty 1

## 2021-11-17 MED ORDER — CLONIDINE HCL 0.1 MG PO TABS
0.2000 mg | ORAL_TABLET | Freq: Three times a day (TID) | ORAL | Status: DC
  Administered 2021-11-17 – 2021-11-22 (×13): 0.2 mg via ORAL
  Filled 2021-11-17 (×3): qty 1
  Filled 2021-11-17 (×4): qty 2
  Filled 2021-11-17 (×2): qty 1
  Filled 2021-11-17: qty 2
  Filled 2021-11-17: qty 1
  Filled 2021-11-17 (×3): qty 2

## 2021-11-17 MED ORDER — HYDRALAZINE HCL 50 MG PO TABS
75.0000 mg | ORAL_TABLET | Freq: Three times a day (TID) | ORAL | Status: DC
  Administered 2021-11-17 – 2021-11-18 (×2): 75 mg via ORAL
  Filled 2021-11-17 (×2): qty 1

## 2021-11-17 MED ORDER — LACTATED RINGERS IV SOLN: INTRAVENOUS | Status: DC

## 2021-11-17 MED ORDER — ASPIRIN 300 MG RE SUPP
300.0000 mg | Freq: Every day | RECTAL | Status: DC
  Filled 2021-11-17: qty 1

## 2021-11-17 MED ORDER — PANTOPRAZOLE SODIUM 40 MG PO TBEC
40.0000 mg | DELAYED_RELEASE_TABLET | Freq: Every day | ORAL | Status: DC
  Administered 2021-11-18: 40 mg via ORAL
  Filled 2021-11-17: qty 1

## 2021-11-17 NOTE — Procedures (Signed)
Extubation Procedure Note  Patient Details:   Name: MAARTEN BAISCH DOB: 1956-03-31 MRN: [id number]   Airway Documentation:    Vent end date: 11/17/21 Vent end time: 1630   Evaluation  O2 sats: stable throughout Complications: No apparent complications Patient did tolerate procedure well. Bilateral Breath Sounds: Diminished, Rhonchi   Yes Audible cuff leak was heard prior extubation and no signs of stridor at the moment. Pt is able to speak and is currently stable. Pt is now on 4L [place] RT will continue to monitor. Felecia Jan 11/17/2021, 4:31 PM

## 2021-11-17 NOTE — Progress Notes (Signed)
Pharmacy Antibiotic Note  Manuel Kramer is a 66 y.o. male admitted on 11/14/2021 after being found down at home.  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 Pt s/p LP.   Renal function improving, now afebrile, WBC normalized.  Plan: Continue acyclovir 600mg  IV Q12H (~ 10mg /kg) Discussed with MD, reduce LR to 75 ml/hr with positive I/O's Monitor renal fxn, volume status, micro data  Height: 5\' 6"  (167.6 cm) Weight: 61 kg (134 lb 7.7 oz) IBW/kg (Calculated) : 63.8  Temp (24hrs), Avg:98.1 F (36.7 C), Min:96.6 F (35.9 C), Max:99.4 F (37.4 C)  Recent Labs  Lab 11/14/21 0418 11/14/21 0420 11/14/21 0721 11/14/21 1219 11/15/21 0632 11/16/21 0721 11/17/21 0457  WBC  --  12.3*  --   --  14.7* 12.9* 10.5  CREATININE 1.40* 1.68*  --  1.83* 2.05* 2.06* 1.83*  LATICACIDVEN 4.6*  --  4.7*  --   --   --   --      Estimated Creatinine Clearance: 35.2 mL/min (A) (by C-G formula based on SCr of 1.83 mg/dL (H)).    No Known Allergies   Cefepime 1/16 >> 1/18 Vanc 1/16 >> 1/18 Acyclovir 1/16 >>    1/16 BCx - ngtd 1/16 CSF -  1/16 CSF ct - neut 94, lymphs 6, glucose elevated, protein wnl 1/16 HSV pcr - ip 1/16 MRSA PCR - negative  Devynn Scheff D. 2/16, PharmD, BCPS, BCCCP 11/17/2021, 9:05 AM

## 2021-11-17 NOTE — Progress Notes (Signed)
Neurology Progress Note   Subjective: Manuel Kramer is a 66 year old male with PMHx of T2DM, HTN, who was BIBEMS unresponsive for an unknown down time, after neighbors reported his medical alarm went off and consulted to the neurology service for evaluation of unresponsiveness. Improved from yesterday, Manuel Kramer does open his eyes, nods in response to questions, and tracks faces well; he also continues to have some meaningful limb movements.  Objective: Exam: Vitals:   11/17/21 1100 11/17/21 1115  BP: (!) 148/78 (!) 146/79  Pulse: 80 78  Resp: 17 18  Temp:    SpO2: 99% 99%    Physical Exam  Constitutional: Appears well-developed and well-nourished.  Psych: Nods and smiles in response to questions Eyes: No scleral injection HENT: No OP obstrucion MSK: no joint deformities.  Cardiovascular: Normal rate and regular rhythm.  Respiratory: Effort normal, non-labored breathing GI: Soft. No distension. Skin: WDI  Neuro: Mental Status: Patient is awake and alert. Cranial Nerves: II: Pupils are equal, round, and reactive to light.   III,IV, VI: EOMI without ptosis or diploplia.  V, VII: Blinks appropriately VIII: Responds to commands to squeeze hands and wiggle toes IX, X: Cough and gag reflex intact XII: Tongue protrudes midline without atrophy or fasciculations.  Motor: Tone is normal. Bulk is normal. Meaningful movements of BUE and BLE Sensory: Sensation is symmetric to pain in BLE. Deep Tendon Reflexes: 2+ and symmetric in the biceps, Achilles, and patellae. Plantars: Toes are downgoing bilaterally.    Pertinent Labs: HSV CSF in process VZV PCR, CSF  Specimen Information: CSF; Cerebrospinal Fluid  0 Result Notes     Component Ref Range & Units 3 d ago  VZV PCR, CSF Negative Negative   Comment: (NOTE)  No Varicella Zoster Virus DNA detected.     CBC Order: [id number] Status: Final result    Visible to patient: No (inaccessible in MyChart)    Next appt: None    0  Result Notes            Component Ref Range & Units 04:57 (11/17/21) 1 d ago (11/16/21) 2 d ago (11/15/21) 2 d ago (11/15/21) 2 d ago (11/15/21) 3 d ago (11/14/21) 3 d ago (11/14/21) 3 d ago (11/14/21)  WBC 4.0 - 10.5 K/uL 10.5  12.9 High   14.7 High     12.3 High      RBC 4.22 - 5.81 MIL/uL 3.55 Low   3.40 Low   4.10 Low     4.54     Hemoglobin 13.0 - 17.0 g/dL 37.1 Low   06.2 Low   69.4 Low   11.9 Low   10.2 Low   13.8  15.0  14.3   HCT 39.0 - 52.0 % 32.4 Low   30.8 Low   36.0 Low   35.0 Low   30.0 Low   41.1  44.0  42.0   MCV 80.0 - 100.0 fL 91.3  90.6  87.8    90.5     MCH 26.0 - 34.0 pg 31.0  30.3  30.7    30.4     MCHC 30.0 - 36.0 g/dL 85.4  62.7  03.5    00.9     RDW 11.5 - 15.5 % 12.6  12.3  12.1    11.9     Platelets 150 - 400 K/uL 197  205  215    214     nRBC 0.0 - 0.2 % 0.0  0.0 CM  0.0 CM  0.0          Basic metabolic panel            Component Ref Range & Units 04:57 (11/17/21) 1 d ago (11/16/21) 2 d ago (11/15/21) 2 d ago (11/15/21) 2 d ago (11/15/21) 3 d ago (11/14/21) 3 d ago (11/14/21)  Sodium 135 - 145 mmol/L 141  137  140  137  138  136  132 Low    Potassium 3.5 - 5.1 mmol/L 3.7  3.0 Low   3.0 Low   3.4 Low   2.3 Low Panic   2.8 Low   4.0   Comment: DELTA CHECK NOTED  Chloride 98 - 111 mmol/L 110  106  104    102  97 Low    CO2 22 - 32 mmol/L 23  21 Low   23    21 Low   24   Glucose, Bld 70 - 99 mg/dL 025 High   427 High  CM  111 High  CM    183 High  CM  533 High Panic  CM   Comment: Glucose reference range applies only to samples taken after fasting for at least 8 hours.  BUN 8 - 23 mg/dL 24 High   24 High   17    14  14    Creatinine, Ser 0.61 - 1.24 mg/dL High   0.62 High   2.05 High     1.83 High   1.68 High    Calcium 8.9 - 10.3 mg/dL 8.4 Low   8.3 Low   8.7 Low     8.4 Low   8.6 Low    GFR, Estimated >60 mL/min 41 Low   35 Low  CM  36 Low  CM    41 Low  CM  45 Low  CM   Comment: (NOTE)  Calculated using the CKD-EPI Creatinine Equation (2021)   Anion  gap 5 - 15 8  10  CM  13 CM    13 CM  11 CM        Enterovirus CSF neg Pathologist smear review: acute inflammatory cells TG 219 CSF Glucose 136 CSF cell count: WBC 16, Segmented neutrophils 94%, Lymphocytes 6% CSF cx No growth x 48 hours A1c 9.9% BH Acid 1.08 Blood cx No growth x 2 days  Imaging Reviewed: MRI IMPRESSION: Subcentimeter acute infarcts within the right frontal operculum, genu of right internal capsule, right frontoparietal periventricular white matter, and posterior right frontal lobe subcortical white matter as described.   6 mm subacute infarct within right frontoparietal periventricular white matter.   Background moderate chronic small vessel ischemic changes within the cerebral white matter, including chronic lacunar infarcts. Mild chronic small vessel ischemic changes also present within the pons.   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   Fairly numerous supratentorial and infratentorial chronic parenchymal microhemorrhages. 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   Mild-to-moderate generalized cerebral atrophy. Comparatively mild cerebellar atrophy.   Small bilateral mastoid effusions.     Electronically Signed   By: 3.76 D.O.   On: 11/16/2021 14:34  EEG restarted at 1816 on 1/17- 0730 on 1/18: Abnormal due to diffuse slowing with atypical arousal patterns and some runs of GRDA, indicative of a severe encephalopathy pattern. No seizures were seen. Reactivity now present with atypical arousal patterns.  Assessment: 66 year old male evaluated for unresponsiveness. On EEG, some reactivity noted. Patient awake and able to follow commands today. Patient with  diffuse slowing on EEG, c/w encephalopathy that is likely multifactorial 2/2 HTN, hyperglycemia, and a potentially provoked seizure. MRI Brain with multiple areas of infarct and microhemorrhages. As for the neutrophilic pleocytosis on L, could  be consistent with a potential viral meningitis vs reactive after a seizure. HSV still pending.  Recommendations: 1) Continue to wean sedation as tolerated 2)Continue Acyclovir 600 mg q12h while awaiting CSF HSV results 3) - HgbA1c 9.9%, fasting lipid panel ordered - MRI brain without contrast obtained - Frequent neuro checks - Echocardiogram - Risk factor modification: Continue Hydralazine 75 mg q8h per tube for HTN mgmt - Telemetry monitoring - PT consult, OT consult, Speech consult - Stroke team to follow   Pt seen by Neuro Psych Resident and later by attending MD. Note/plan to be edited by MD as needed.    Lamar Sprinklesourtney Melani Brisbane, MD PGY-1 11/17/2021  11:34 AM

## 2021-11-17 NOTE — Progress Notes (Signed)
Adjusted vent settings to 5/5 after verbal order from Dr. Judeth Horn and discussion on phone with Jill Alexanders RT.

## 2021-11-17 NOTE — Progress Notes (Addendum)
NAME:  Manuel Kramer, MRN:  [id number], DOB:  1956/10/24, LOS: 3 ADMISSION DATE:  11/14/2021, CONSULTATION DATE:  1/16 REFERRING MD:  Dr. Eudelia Bunch, CHIEF COMPLAINT:  encephalopathy; fever; htn emergency   History of Present Illness:  Patient is a 66 year old male with pertinent PMH tobacco abuse, DMT2, HTN presents to [facility] ED on 1/16 after being found unresponsive.  On 1/16, EMS called to patient's house at 1:30 AM after medical alarm going off.  Police had to break into the home to get inside and upon entry, found patient in bed unresponsive.  Patient BP initially 200/100.  EMS states patient was incoherently combative with right eye deviation.  Elevated CBG.  Brought to [facility] ED.  Upon arrival to [facility] ED, patient hypertensive, fever 101.4 F, and unresponsive.  Patient intubated for airway protection.  CT and CTA head negative. Started on cardene. Neuro consulted. Recommend starting EEG. Keppra started.   Lactic acid 4.6.  VBG 7.47, 39, 45, 29.  NA 132.  Glucose 533.  Creat 1.68.  WBC 12.3.  Troponin 30.  Ethanol and ammonia WNL.  UA clean. UDS positive for benzos though collected after RSI.   PCCM consulted for ICU admission.  Pertinent  Medical History   Past Medical History:  Diagnosis Date   Chronic diarrhea    Diabetes mellitus without complication (HCC)    Hypertension    Significant Hospital Events: Including procedures, antibiotic start and stop dates in addition to other pertinent events   1/16: admitted to [facility]; unresponsive; intubated for airway protection; on cleviprex, LTM, LP, vanc/ cefepime and acyclovir added, worsening renal function 1/17:  no seizures, LTM stopped, ongoing cleviprex, UOP down/ sCr up, febrile 1/18 MRI revealed several subacute infarcts and numerous microhemorrhages  1/19 More alert this am and able to intermittently follow commands, Cleviprex restarted overnight   Interim History / Subjective:  Will open eyes to verbal stimuli Nursing states he has  intermittently follow commands  Cleviprex restarted   Objective   Blood pressure (!) 152/84, pulse 73, temperature 98 F (36.7 C), temperature source Axillary, resp. rate 14, height 5\' 6"  (1.676 m), weight 61 kg, SpO2 99 %.    Vent Mode: PRVC FiO2 (%):  [30 %] 30 % Set Rate:  [12 bmp] 12 bmp Vt Set:  [510 mL] 510 mL PEEP:  [5 cmH20] 5 cmH20 Pressure Support:  [8 cmH20] 8 cmH20 Plateau Pressure:  [16 cmH20-18 cmH20] 16 cmH20   Intake/Output Summary (Last 24 hours) at 11/17/2021 0707 Last data filed at 11/17/2021 0700 Gross per 24 hour  Intake 4710.8 ml  Output 1585 ml  Net 3125.8 ml    Filed Weights   11/15/21 0500 11/16/21 0500 11/17/21 0500  Weight: 61.1 kg 61 kg 61 kg   Examination: General: Acute on chronically ill appearing middle aged male lying in bed on mechanical ventilation, in NAD HEENT: ETT, MM pink/moist, PERRL,  Neuro: Will open eyes to verbal stimuli, Non-focal  CV: s1s2 regular rate and rhythm, no murmur, rubs, or gallops,  PULM:  Clear to ascultation, tolerating vent, no increased work of breathing, no added breath sounds GI: soft, bowel sounds active in all 4 quadrants, non-tender, non-distended, tolerating TF Extremities: warm/dry, no edema  Skin: no rashes or lesions  Resolved problems:   Hypokalemia  Sepsis  HHS  Assessment & Plan:   Acute metabolic encephalopathy Multiple acute strokes   -Unclear etiology at this point ddx include seizure vs CNS process vs HHS vs PRES? -CTA/ perfusion- no  LVO or perfusion deficit -UDS +benzo,  Ammonia 51 > 15. LP preformed 1/16 -1/18 MRI obtained revealed several subacute infarcts and numerous microhemorrhages  P:  Management per neurology  Maintain neuro protective measures; goal for eurothermia, euglycemia, eunatermia, normoxia, and PCO2 goal of 35-40 Nutrition and bowel regiment  Seizure precautions  Aspirations precautions  LP cultures remains NTD Remains on empiric acyclovir but VZV PCR now negative can  likely stop  EEG per neuro   Acute hypoxic respiratory failure with inability to protect airway secondary to above P: Tolerating SBT well with improved mentation, hopeful to extubate soon  Continue ventilator support with lung protective strategies  Wean PEEP and FiO2 for sats greater than 90%. Head of bed elevated 30 degrees. Plateau pressures less than 30 cm H20.  Follow intermittent chest x-ray and ABG.   SAT/SBT as tolerated, mentation preclude extubation  Ensure adequate pulmonary hygiene  Follow cultures  VAP bundle in place  PAD protocol  HTN emergency P: BP controlled on norvasc, clonidine Continuous telemetry  Home ACE on hold in the setting of AKI  AKI  -likely multifactorial in the setting of HTN emergency, home ACEi, s/p IV dye, and still needing vanc/ acyclovir at this time  -renal ultrasound neg, FeNa is pre-renal  P: Follow renal function  Monitor urine output Trend Bmet Avoid nephrotoxins Ensure adequate renal perfusion  Renally dose medication   Hx of diabetes  -Evidence of HHS on admit but this has since resolved  P: Continue SSI  CBG q4hrs  Continue Levemir 5 units BID   Protein calorie malnutrition  P: Continue TF  Supplement protein   Best Practice (right click and "Reselect all SmartList Selections" daily)   Diet/type: NPO,  TF DVT prophylaxis: prophylactic heparin  GI prophylaxis: PPI Lines: N/A Foley:  Yes, and it is still needed Code Status:  full code Last date of multidisciplinary goals of care discussion: pending. Daughter and son remain primary NOK.  Is still married but separated for 23yrs.   Pending update 1/18.   Critical care time:    Performed by: Ayana Imhof D. Harris  Total critical care time: 38 minutes  Critical care time was exclusive of separately billable procedures and treating other patients.  Critical care was necessary to treat or prevent imminent or life-threatening deterioration.  Critical care was time  spent personally by me on the following activities: development of treatment plan with patient and/or surrogate as well as nursing, discussions with consultants, evaluation of patient's response to treatment, examination of patient, obtaining history from patient or surrogate, ordering and performing treatments and interventions, ordering and review of laboratory studies, ordering and review of radiographic studies, pulse oximetry and re-evaluation of patient's condition.  Kamika Goodloe D. Tiburcio Pea, NP-C [facility] Personal contact information can be found on Amion  11/17/2021, 7:20 AM

## 2021-11-18 ENCOUNTER — Inpatient Hospital Stay (HOSPITAL_COMMUNITY): Payer: Medicaid Other

## 2021-11-18 ENCOUNTER — Encounter (HOSPITAL_COMMUNITY): Payer: Self-pay | Admitting: Pulmonary Disease

## 2021-11-18 DIAGNOSIS — G9340 Encephalopathy, unspecified: Secondary | ICD-10-CM | POA: Diagnosis not present

## 2021-11-18 DIAGNOSIS — I6389 Other cerebral infarction: Secondary | ICD-10-CM

## 2021-11-18 LAB — CBC
HCT: 29.4 % — ABNORMAL LOW (ref 39.0–52.0)
HCT: 32.7 % — ABNORMAL LOW (ref 39.0–52.0)
Hemoglobin: 9.6 g/dL — ABNORMAL LOW (ref 13.0–17.0)
Hemoglobin: 11 g/dL — ABNORMAL LOW (ref 13.0–17.0)
MCH: 31.4 pg (ref 26.0–34.0)
MCH: 31.3 pg (ref 26.0–34.0)
MCHC: 32.7 g/dL (ref 30.0–36.0)
MCHC: 33.6 g/dL (ref 30.0–36.0)
MCV: 96.1 fL (ref 80.0–100.0)
MCV: 92.9 fL (ref 80.0–100.0)
Platelets: 222 10*3/uL (ref 150–400)
Platelets: 296 10*3/uL (ref 150–400)
RBC: 3.06 MIL/uL — ABNORMAL LOW (ref 4.22–5.81)
RBC: 3.52 MIL/uL — ABNORMAL LOW (ref 4.22–5.81)
RDW: 13.1 % (ref 11.5–15.5)
RDW: 12.8 % (ref 11.5–15.5)
WBC: 10 10*3/uL (ref 4.0–10.5)
WBC: 12.2 10*3/uL — ABNORMAL HIGH (ref 4.0–10.5)
nRBC: 0 % (ref 0.0–0.2)
nRBC: 0 % (ref 0.0–0.2)

## 2021-11-18 LAB — GLUCOSE, CAPILLARY
Glucose-Capillary: 188 mg/dL — ABNORMAL HIGH (ref 70–99)
Glucose-Capillary: 159 mg/dL — ABNORMAL HIGH (ref 70–99)
Glucose-Capillary: 134 mg/dL — ABNORMAL HIGH (ref 70–99)
Glucose-Capillary: 157 mg/dL — ABNORMAL HIGH (ref 70–99)
Glucose-Capillary: 188 mg/dL — ABNORMAL HIGH (ref 70–99)

## 2021-11-18 LAB — BASIC METABOLIC PANEL
Anion gap: 7 (ref 5–15)
BUN: 29 mg/dL — ABNORMAL HIGH (ref 8–23)
CO2: 23 mmol/L (ref 22–32)
Calcium: 8.1 mg/dL — ABNORMAL LOW (ref 8.9–10.3)
Chloride: 108 mmol/L (ref 98–111)
Creatinine, Ser: 1.81 mg/dL — ABNORMAL HIGH (ref 0.61–1.24)
GFR, Estimated: 41 mL/min — ABNORMAL LOW (ref >60)
Glucose, Bld: 202 mg/dL — ABNORMAL HIGH (ref 70–99)
Potassium: 3.5 mmol/L (ref 3.5–5.1)
Sodium: 138 mmol/L (ref 135–145)

## 2021-11-18 LAB — LACTIC ACID, PLASMA: Lactic Acid, Venous: 1.1 mmol/L (ref 0.5–1.9)

## 2021-11-18 LAB — CSF CULTURE W GRAM STAIN: Culture: NO GROWTH

## 2021-11-18 LAB — ECHOCARDIOGRAM COMPLETE BUBBLE STUDY
AR max vel: 1.61 cm2
AV Area VTI: 1.39 cm2
AV Area mean vel: 1.37 cm2
AV Mean grad: 8 mmHg
AV Peak grad: 15.5 mmHg
Ao pk vel: 1.97 m/s
Area-P 1/2: 3.63 cm2
P 1/2 time: 389 msec
S' Lateral: 2.8 cm

## 2021-11-18 LAB — PHOSPHORUS: Phosphorus: 3.3 mg/dL (ref 2.5–4.6)

## 2021-11-18 LAB — LIPID PANEL
Cholesterol: 163 mg/dL (ref 0–200)
HDL: 41 mg/dL (ref >40)
LDL Cholesterol: 88 mg/dL (ref 0–99)
Total CHOL/HDL Ratio: 4 RATIO
Triglycerides: 169 mg/dL — ABNORMAL HIGH (ref <150)
VLDL: 34 mg/dL (ref 0–40)

## 2021-11-18 LAB — TSH: TSH: 2.064 u[IU]/mL (ref 0.350–4.500)

## 2021-11-18 LAB — SEDIMENTATION RATE: Sed Rate: 55 mm/h — ABNORMAL HIGH (ref 0–16)

## 2021-11-18 LAB — TRIGLYCERIDES: Triglycerides: 163 mg/dL — ABNORMAL HIGH (ref <150)

## 2021-11-18 LAB — FOLATE: Folate: 7.7 ng/mL (ref >5.9)

## 2021-11-18 LAB — VITAMIN B12: Vitamin B-12: 540 pg/mL (ref 180–914)

## 2021-11-18 MED ORDER — POTASSIUM CHLORIDE 20 MEQ PO PACK
40.0000 meq | PACK | Freq: Once | ORAL | Status: AC
  Administered 2021-11-18: 40 meq via ORAL
  Filled 2021-11-18: qty 2

## 2021-11-18 MED ORDER — HYDRALAZINE HCL 50 MG PO TABS
100.0000 mg | ORAL_TABLET | Freq: Three times a day (TID) | ORAL | Status: DC
  Administered 2021-11-18 – 2021-11-22 (×11): 100 mg via ORAL
  Filled 2021-11-18 (×12): qty 2

## 2021-11-18 MED ORDER — ENSURE ENLIVE PO LIQD
237.0000 mL | Freq: Two times a day (BID) | ORAL | Status: DC
  Administered 2021-11-19 – 2021-11-23 (×7): 237 mL via ORAL

## 2021-11-18 MED ORDER — FUROSEMIDE 10 MG/ML IJ SOLN
INTRAMUSCULAR | Status: AC
  Administered 2021-11-18: 40 mg via INTRAVENOUS
  Filled 2021-11-18: qty 4

## 2021-11-18 MED ORDER — AMLODIPINE BESYLATE 10 MG PO TABS
10.0000 mg | ORAL_TABLET | Freq: Every day | ORAL | Status: DC
  Administered 2021-11-18 – 2021-12-05 (×18): 10 mg via ORAL
  Filled 2021-11-18 (×18): qty 1

## 2021-11-18 MED ORDER — FUROSEMIDE 10 MG/ML IJ SOLN
40.0000 mg | Freq: Once | INTRAMUSCULAR | Status: AC
  Filled 2021-11-18: qty 4

## 2021-11-18 MED ORDER — ATORVASTATIN CALCIUM 40 MG PO TABS
40.0000 mg | ORAL_TABLET | Freq: Every day | ORAL | Status: DC
  Administered 2021-11-18 – 2021-12-05 (×18): 40 mg via ORAL
  Filled 2021-11-18 (×18): qty 1

## 2021-11-18 MED ORDER — INSULIN ASPART 100 UNIT/ML IJ SOLN
0.0000 [IU] | Freq: Three times a day (TID) | INTRAMUSCULAR | Status: DC
  Administered 2021-11-19: 1 [IU] via SUBCUTANEOUS
  Administered 2021-11-19: 5 [IU] via SUBCUTANEOUS
  Administered 2021-11-21: 1 [IU] via SUBCUTANEOUS
  Administered 2021-11-21: 2 [IU] via SUBCUTANEOUS
  Administered 2021-11-21: 1 [IU] via SUBCUTANEOUS

## 2021-11-18 NOTE — Progress Notes (Signed)
  Inpatient Rehab Admissions Coordinator :  Per therapy recommendations patient was screened for CIR candidacy by Ulla Mckiernan RN MSN. Patient is not yet at a level to tolerate the intensity required to pursue a CIR . Patient may have the potential to progress to become a candidate. The CIR admissions team will follow and monitor for progress and place a Rehab Consult order if felt to be appropriate. Please contact me with any questions.  Micahel Omlor RN MSN Admissions Coordinator [phone]  

## 2021-11-18 NOTE — Evaluation (Signed)
Physical Therapy Evaluation Patient Details Name: Manuel Kramer MRN: [id number] DOB: 14-Nov-1955 Today's Date: 11/18/2021  History of Present Illness  66 y/o male presented to ED on 11/14/21 after being found in his home altered and combative. Unresponsive in ED. EEG suggestive of severe encephalopathy. 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 Intubated 1/16-1/19. PMH: HTN, DM  Clinical Impression  Patient admitted with above diagnosis. PTA, patient was independent and living alone. Patient currently presents with weakness, impaired communication, impaired balance, impaired coordination, and impaired cognition. Patient requires maxA+2 for bed mobility and totalA to maintain sitting EOB due to strong posterior lean. Difficult to fully assess deficits due to impaired cognition and limited command following this date. Patient will benefit from skilled PT services during acute stay to address listed deficits. Recommend CIR at discharge to maximize functional mobility and potential.       Recommendations for follow up therapy are one component of a multi-disciplinary discharge planning process, led by the attending physician.  Recommendations may be updated based on patient status, additional functional criteria and insurance authorization.  Follow Up Recommendations Acute inpatient rehab (3hours/day)    Assistance Recommended at Discharge Frequent or constant Supervision/Assistance  Patient can return home with the following  Two people to help with walking and/or transfers;Two people to help with bathing/dressing/bathroom;Assistance with feeding;Assistance with cooking/housework;Direct supervision/assist for medications management;Direct supervision/assist for financial management;Assist for transportation;Help with stairs or ramp for entrance     Equipment Recommendations Other (comment) (TBD)  Recommendations for Other Services  Rehab consult    Functional Status Assessment Patient has had a recent decline in their functional status and demonstrates the ability to make significant improvements in function in a reasonable and predictable amount of time.     Precautions / Restrictions Precautions Precautions: Fall Precaution Comments: aphasia Restrictions Weight Bearing Restrictions: No      Mobility  Bed Mobility Overal bed mobility: Needs Assistance Bed Mobility: Supine to Sit, Sit to Supine     Supine to sit: Max assist, +2 for physical assistance, +2 for safety/equipment, HOB elevated Sit to supine: Total assist, +2 for physical assistance, +2 for safety/equipment   General bed mobility comments: heavy use of the bed pad to facilitate, Pt non-combative during movement, max A +2 to total A for all aspects    Transfers                   General transfer comment: NT this session    Ambulation/Gait                  Stairs            Wheelchair Mobility    Modified Rankin (Stroke Patients Only) Modified Rankin (Stroke Patients Only) Pre-Morbid Rankin Score: No symptoms Modified Rankin: Severe disability     Balance Overall balance assessment: Needs assistance Sitting-balance support: Bilateral upper extremity supported, Feet supported Sitting balance-Leahy Scale: Zero Sitting balance - Comments: dependent on external support Postural control: Posterior lean, Right lateral lean                                   Pertinent Vitals/Pain Pain Assessment Pain Assessment: Faces Faces Pain Scale: No hurt Pain Intervention(s): Monitored during session    Home Living Family/patient expects to be discharged  to:: Private residence Living Arrangements: Alone   Type of Home: Apartment Home Access: Stairs to enter Entrance Stairs-Rails: Right Entrance Stairs-Number of  Steps: 2 flights   Home Layout: One level Home Equipment: None Additional Comments: former Programmer, systems and Chartered loss adjuster; information provided by son via phone    Prior Function Prior Level of Function : Independent/Modified Independent (does not drive or work)             Mobility Comments: no DME, managing 2 flights of stairs without problem ADLs Comments: managing own finances and medicine     Hand Dominance   Dominant Hand: Right    Extremity/Trunk Assessment   Upper Extremity Assessment Upper Extremity Assessment: Defer to OT evaluation RUE Deficits / Details: generalized weakness, does attempt to make thumbs up, grossly 2+ RUE Coordination: decreased fine motor;decreased gross motor LUE Deficits / Details: generalized weakness and will not move to command, but observed in the bed reaching across midline, uncoordinated but able to move more spontaneously than when asked to reach/hold for MMT. when presented with functional items to take he did not initally try and reach for them LUE Sensation: decreased proprioception LUE Coordination: decreased fine motor;decreased gross motor    Lower Extremity Assessment Lower Extremity Assessment: RLE deficits/detail;LLE deficits/detail;Difficult to assess due to impaired cognition RLE Deficits / Details: generalized weakness with spontaneous movement and not on command. Difficult to assess due to aphasia/impaired cognition RLE Coordination: decreased gross motor;decreased fine motor LLE Deficits / Details: generalized weakness with spontaneous movement and not on command. Difficult to assess due to aphasia/impaired cognition. Seems weaker than R but difficult to assess. LLE Coordination: decreased fine motor;decreased gross motor    Cervical / Trunk Assessment Cervical / Trunk Assessment: Normal  Communication   Communication: Receptive difficulties;Expressive difficulties  Cognition Arousal/Alertness: Awake/alert Behavior During Therapy:  Restless, Agitated Overall Cognitive Status: Impaired/Different from baseline Area of Impairment: Following commands, Awareness, Problem solving                       Following Commands: Follows one step commands with increased time, Follows one step commands inconsistently   Awareness: Intellectual Problem Solving: Slow processing, Decreased initiation, Difficulty sequencing, Requires verbal cues, Requires tactile cues General Comments: Pt 99% non-verbal throughout session, did say one untelligible word. 10-15 second processing delay, trying to follow commands with R arm to give thumbs up. Seems fearful at times, follows commands 10% of the time even with multimodal cues        General Comments General comments (skin integrity, edema, etc.): VSS on RA throughout session, BP running high but within limts set by MD; LUE with noted edema, RN aware    Exercises     Assessment/Plan    PT Assessment Patient needs continued PT services  PT Problem List Decreased strength;Decreased activity tolerance;Decreased balance;Decreased mobility;Decreased coordination;Decreased cognition;Decreased knowledge of use of DME;Decreased safety awareness;Decreased knowledge of precautions;Impaired sensation       PT Treatment Interventions DME instruction;Gait training;Functional mobility training;Therapeutic activities;Therapeutic exercise;Balance training;Neuromuscular re-education;Patient/family education    PT Goals (Current goals can be found in the Care Plan section)  Acute Rehab PT Goals Patient Stated Goal: unable to state PT Goal Formulation: Patient unable to participate in goal setting Time For Goal Achievement: 12/02/21 Potential to Achieve Goals: Fair    Frequency Min 4X/week     Co-evaluation PT/OT/SLP Co-Evaluation/Treatment: Yes Reason for Co-Treatment: Complexity of the patient's impairments (multi-system involvement);For patient/therapist safety;Necessary to address  cognition/behavior during functional activity;To address  functional/ADL transfers PT goals addressed during session: Mobility/safety with mobility;Balance;Strengthening/ROM OT goals addressed during session: ADL's and self-care;Strengthening/ROM       AM-PAC PT "6 Clicks" Mobility  Outcome Measure Help needed turning from your back to your side while in a flat bed without using bedrails?: Total Help needed moving from lying on your back to sitting on the side of a flat bed without using bedrails?: Total Help needed moving to and from a bed to a chair (including a wheelchair)?: Total Help needed standing up from a chair using your arms (e.g., wheelchair or bedside chair)?: Total Help needed to walk in hospital room?: Total Help needed climbing 3-5 steps with a railing? : Total 6 Click Score: 6    End of Session   Activity Tolerance: Patient tolerated treatment well Patient left: in bed;with call bell/phone within reach (with SLP present) Nurse Communication: Mobility status PT Visit Diagnosis: Muscle weakness (generalized) (M62.81);Other abnormalities of gait and mobility (R26.89);Other symptoms and signs involving the nervous system (R29.898)    Time: 5329-9242 PT Time Calculation (min) (ACUTE ONLY): 21 min   Charges:   PT Evaluation $PT Eval Moderate Complexity: 1 Mod          Merlin Ege A. Dan Humphreys PT, DPT Acute Rehabilitation Services Pager [phone] Office [phone]   Viviann Spare 11/18/2021, 12:45 PM

## 2021-11-18 NOTE — Progress Notes (Signed)
NAME:  Manuel Kramer, MRN:  [id number], DOB:  05-18-1956, LOS: 4 ADMISSION DATE:  11/14/2021, CONSULTATION DATE:  1/16 REFERRING MD:  Dr. Eudelia Bunch, CHIEF COMPLAINT:  encephalopathy; fever; htn emergency   History of Present Illness:  Patient is a 66 year old male with pertinent PMH tobacco abuse, DMT2, HTN presents to [facility] ED on 1/16 after being found unresponsive.  On 1/16, EMS called to patient's house at 1:30 AM after medical alarm going off.  Police had to break into the home to get inside and upon entry, found patient in bed unresponsive.  Patient BP initially 200/100.  EMS states patient was incoherently combative with right eye deviation.  Elevated CBG.  Brought to [facility] ED.  Upon arrival to [facility] ED, patient hypertensive, fever 101.4 F, and unresponsive.  Patient intubated for airway protection.  CT and CTA head negative. Started on cardene. Neuro consulted. Recommend starting EEG. Keppra started.   Lactic acid 4.6.  VBG 7.47, 39, 45, 29.  NA 132.  Glucose 533.  Creat 1.68.  WBC 12.3.  Troponin 30.  Ethanol and ammonia WNL.  UA clean. UDS positive for benzos though collected after RSI.   PCCM consulted for ICU admission.  Pertinent  Medical History   Past Medical History:  Diagnosis Date   Chronic diarrhea    Diabetes mellitus without complication (HCC)    Hypertension    Significant Hospital Events: Including procedures, antibiotic start and stop dates in addition to other pertinent events   1/16: admitted to [facility]; unresponsive; intubated for airway protection; on cleviprex, LTM, LP, vanc/ cefepime and acyclovir added, worsening renal function 1/17:  no seizures, LTM stopped, ongoing cleviprex, UOP down/ sCr up, febrile 1/18 MRI revealed several subacute infarcts and numerous microhemorrhages  1/19 More alert this am and able to intermittently follow commands, tolerated extubation that afternoon 1/20 no acute events overnight   Interim History / Subjective:  Eyes spontaneously  open and able to follow intermittent commands unable to verbally communicate  Objective   Blood pressure (!) 174/85, pulse 72, temperature 97.7 F (36.5 C), temperature source Axillary, resp. rate 17, height 5\' 6"  (1.676 m), weight 61 kg, SpO2 97 %.    Vent Mode: PSV;CPAP FiO2 (%):  [30 %] 30 % PEEP:  [5 cmH20] 5 cmH20 Pressure Support:  [5 cmH20-10 cmH20] 5 cmH20   Intake/Output Summary (Last 24 hours) at 11/18/2021 11/20/2021 Last data filed at 11/18/2021 0400 Gross per 24 hour  Intake 1739.96 ml  Output 800 ml  Net 939.96 ml    Filed Weights   11/15/21 0500 11/16/21 0500 11/17/21 0500  Weight: 61.1 kg 61 kg 61 kg   Examination: General: Acute on chronic ill-appearing middle-aged male lying in bed in no acute distress HEENT: [place]/AT, MM pink/moist, PERRL,  Neuro: Eyes spontaneously open and will track in room, able to follow commands intermittently, noncommunicative CV: s1s2 regular rate and rhythm, no murmur, rubs, or gallops,  PULM: Clear to auscultation bilaterally, no increased work of breathing, no added breath sounds GI: soft, bowel sounds active in all 4 quadrants, non-tender, non-distended Extremities: warm/dry, no edema  Skin: no rashes or lesions  Resolved problems:   Hypokalemia  Sepsis  HHS Acute hypoxic respiratory failure with inability to protect airway  -Extubated 1/19  Assessment & Plan:   Acute metabolic encephalopathy Multiple acute strokes   -Unclear etiology at this point ddx include seizure vs CNS process vs HHS vs PRES? -CTA/ perfusion- no LVO or perfusion deficit -UDS +benzo,  Ammonia  51 > 15. LP preformed 1/16 -1/18 MRI obtained revealed several subacute infarcts and numerous microhemorrhages  -HSV and VZV negative P:  Management per neurology  Maintain neuro protective measures; goal for eurothermia, euglycemia, eunatermia, normoxia, and PCO2 goal of 35-40 Nutrition and bowel regiment  Seizure precautions  AEDs per neurology  Aspirations  precautions  Stop acyclovir  HTN emergency P: Continue Norvasc and clonidine Increase hydralazine Continue telemetry ACE on hold  AKI  -likely multifactorial in the setting of HTN emergency, home ACEi, s/p IV dye, and still needing vanc/ acyclovir at this time  -renal ultrasound neg, FeNa is pre-renal  P: Trial of Lasix today given excessive volume overload Follow renal function  Monitor urine output Trend Bmet Avoid nephrotoxins Ensure adequate renal perfusion  Renally dose medication  Hx of diabetes  -Evidence of HHS on admit but this has since resolved  P: Continue SSI Continue Levemir but may need to stop if oral intake decreases CBG ACHS  Protein calorie malnutrition  P: SLP eval to determine oral intake  Best Practice (right click and "Reselect all SmartList Selections" daily)   Diet/type: NPO,  TF DVT prophylaxis: prophylactic heparin  GI prophylaxis: PPI Lines: N/A Foley:  Yes, and it is still needed Code Status:  full code Last date of multidisciplinary goals of care discussion: pending. Daughter and son remain primary NOK.  Is still married but separated for 74yrs.   Pending update 1/18.   Critical care time:  NA   Panfilo Ketchum D. Tiburcio Pea, NP-C [facility] Personal contact information can be found on Amion  11/18/2021, 9:25 AM

## 2021-11-18 NOTE — Progress Notes (Addendum)
Neurology Progress Note   Subjective: Manuel Kramer is a 66 year old male with PMHx of T2DM, HTN, who was  [facility] after neighbors reported his medical alarm went off and  was found unresponsive for an unknown down time. Neurology service was consulted for evaluation of his unresponsiveness.   Patient was seen and assessed this morning with 2 Rns at the bedside. Patient was sitting up in his bed, awake, alert,very slow to response or answer any questions. Able to say his first name only. Did not follow most commands. Intermittent involuntary lip and  mouth movement noted but patient is not on any psychotropic medications.Spontaneously moving all extremities and able to push his right hand down when Dr Pearlean BrownieSethi held his right hand up. His nurse and the primary team were updated on the plan of care.  Objective: Exam: Vitals:   11/18/21 1300 11/18/21 1400  BP: (!) 147/81 (!) 161/82  Pulse: 71 72  Resp: 15 16  Temp:    SpO2: 96% 96%    Physical Exam  Constitutional: Appears well-developed and well-nourished.  Psych: Nods and smiles in response to questions Eyes: No scleral injection HENT: No OP obstrucion MSK: no joint deformities.  Cardiovascular: Normal rate and regular rhythm.  Respiratory: Effort normal, non-labored breathing GI: Soft. No distension. Skin: WDI  Neuro: Mental Status: Patient is awake and alert.  Speaks only a few words and answers his name.  Follows only simple midline commands.  Slow to respond. Cranial Nerves: II: Pupils are equal, round, and reactive to light.   III,IV, VI: EOMI without ptosis or diploplia.  V, VII: Blinks appropriately VIII: Responds to commands to lift right hand up IX, X: Cough and gag reflex intact XII: Tongue protrudes midline without atrophy or fasciculations.  Motor: Tone is normal. Bulk is normal. Spontaneously  movements of BUE and BLE Sensory: Sensation is symmetric to pain in BLE. Deep Tendon Reflexes: 2+ and  symmetric in the biceps, Achilles, and patellae. Plantars: Toes are downgoing bilaterally.    Pertinent Labs:  VZV PCR, CSF  Specimen Information: CSF; Cerebrospinal Fluid  0 Result Notes     Component Ref Range & Units 3 d ago  VZV PCR, CSF Negative Negative   Comment: (NOTE)  No Varicella Zoster Virus DNA detected.     CBC Order: [id number] Status: Final result    Visible to patient: No (inaccessible in MyChart)    Next appt: None    0 Result Notes            Component Ref Range & Units 04:57 (11/17/21) 1 d ago (11/16/21) 2 d ago (11/15/21) 2 d ago (11/15/21) 2 d ago (11/15/21) 3 d ago (11/14/21) 3 d ago (11/14/21) 3 d ago (11/14/21)  WBC 4.0 - 10.5 K/uL 10.5  12.9 High   14.7 High     12.3 High      RBC 4.22 - 5.81 MIL/uL 3.55 Low   3.40 Low   4.10 Low     4.54     Hemoglobin 13.0 - 17.0 g/dL 40.911.0 Low   81.110.3 Low   91.412.6 Low   11.9 Low   10.2 Low   13.8  15.0  14.3   HCT 39.0 - 52.0 % 32.4 Low   30.8 Low   36.0 Low   35.0 Low   30.0 Low   41.1  44.0  42.0   MCV 80.0 - 100.0 fL 91.3  90.6  87.8    90.5  MCH 26.0 - 34.0 pg 31.0  30.3  30.7    30.4     MCHC 30.0 - 36.0 g/dL 19.5  09.3  26.7    12.4     RDW 11.5 - 15.5 % 12.6  12.3  12.1    11.9     Platelets 150 - 400 K/uL 197  205  215    214     nRBC 0.0 - 0.2 % 0.0  0.0 CM  0.0 CM    0.0          Basic metabolic panel            Component Ref Range & Units 04:57 (11/17/21) 1 d ago (11/16/21) 2 d ago (11/15/21) 2 d ago (11/15/21) 2 d ago (11/15/21) 3 d ago (11/14/21) 3 d ago (11/14/21)  Sodium 135 - 145 mmol/L 141  137  140  137  138  136  132 Low    Potassium 3.5 - 5.1 mmol/L 3.7  3.0 Low   3.0 Low   3.4 Low   2.3 Low Panic   2.8 Low   4.0   Comment: DELTA CHECK NOTED  Chloride 98 - 111 mmol/L 110  106  104    102  97 Low    CO2 22 - 32 mmol/L 23  21 Low   23    21 Low   24   Glucose, Bld 70 - 99 mg/dL 580 High   998 High  CM  111 High  CM    183 High  CM  533 High Panic  CM   Comment: Glucose reference range  applies only to samples taken after fasting for at least 8 hours.  BUN 8 - 23 mg/dL 24 High   24 High   17    14  14    Creatinine, Ser 0.61 - 1.24 mg/dL High   3.38 High   2.05 High     1.83 High   1.68 High    Calcium 8.9 - 10.3 mg/dL 8.4 Low   8.3 Low   8.7 Low     8.4 Low   8.6 Low    GFR, Estimated >60 mL/min 41 Low   35 Low  CM  36 Low  CM    41 Low  CM  45 Low  CM   Comment: (NOTE)  Calculated using the CKD-EPI Creatinine Equation (2021)   Anion gap 5 - 15 8  10  CM  13 CM    13 CM  11 CM        Enterovirus CSF neg Pathologist smear review: acute inflammatory cells TG 219 CSF Glucose 136 CSF cell count: WBC 16, Segmented neutrophils 94%, Lymphocytes 6% CSF cx No growth x 48 hours A1c 9.9% BH Acid 1.08 Blood cx No growth x 4 days  Imaging Reviewed:  MRA Head IMPRESSION : No large vessel occlusion.  Probable atheromatous change involving the carotid siphons without significant stenosis. No hemodynamically significant or correctable stenosis seen on this motion degraded exam.   MRI IMPRESSION: Subcentimeter acute infarcts within the right frontal operculum, genu of right internal capsule, right frontoparietal periventricular white matter, and posterior right frontal lobe subcortical white matter as described.   6 mm subacute infarct within right frontoparietal periventricular white matter.   Background moderate chronic small vessel ischemic changes within the cerebral white matter, including chronic lacunar infarcts. Mild chronic small vessel ischemic changes also present within the pons.   Chronic lacunar infarcts  within the bilateral thalami.   Fairly numerous supratentorial and infratentorial chronic parenchymal microhemorrhages. 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   Mild-to-moderate generalized cerebral atrophy. Comparatively mild cerebellar atrophy.   Small bilateral mastoid effusions.   EEG restarted at 1816 on  1/17- 0730 on 1/18: Abnormal due to diffuse slowing with atypical arousal patterns and some runs of GRDA, indicative of a severe encephalopathy pattern. No seizures were seen. Reactivity now present with atypical arousal patterns.  Assessment: 66 year old male evaluated for unresponsiveness likely multifactorial encephalopathy from diabetic ketoacidosis and dehydration and infection.. On EEG, some reactivity noted. Patient awake and able to follow commands today. Patient with diffuse slowing on EEG, c/w encephalopathy that is likely multifactorial 2/2 HTN, hyperglycemia, and a potentially provoked seizure. MRI Brain with multiple areas of lacunar infarcts and remote age microhemorrhages.  Multifocal lacunar infarcts likely from small vessel disease due to dehydration in the setting of hyperosmotic diabetic, neurological exam is mostly nonfocal except for significant encephalopathy Labs reviewed today- 11/18/2021 -HSV  PCR, CSF  - negative  AND vzv PCR, CSF- No Varicella Zoster Virus DNA detected : Echocardiogram with bubble study shows ejection fraction of 60-65%. No wall abnormality. No atrial level shunt detected by color flow Doppler.Agitated saline contrast bubble study was negative,with no evidence of any interatrial shunt.  Carotid arterial duplex  done 11/18/2021 shows Right Carotid: The extracranial vessels were near-normal with only minimal wall thickening or plaque. Left Carotid: Velocities in the left ICA are consistent with a 1-39% stenosis. Vertebrals: Bilateral vertebral arteries demonstrate antegrade flow. Subclavians: Normal flow hemodynamics were seen in bilateral subclavian arteries  EKG shows sinus Tachycardia. Lipid panel with LDL 88-   Recommendations: ; Continue on Aspirin 81mg  po daily 1 Acyclovir 600 mg q12h discontinued since  CSF HSV results are negative. - 2. HgbA1c 9.9%,- on Levemir and SSI  3.- Increase Lipitor from 10mg  to 40mg  po Q HS.  4- Frequent neuro checks   -  5- Risk factor modification: Continue Hydralazine 75 mg q8h po for HTN mgmt. 6- Telemetry monitoring 7- PT /OT /Speech evaluation in process 8- Stroke team to follow   Pt seen by attending MD and stroke team. Note/plan to be edited by MD as needed.   11/18/2021  2:41 PM   STROKE MD NOTE : I have personally obtained history,examined this patient, reviewed notes, independently viewed imaging studies, participated in medical decision making and plan of care.ROS completed by me personally and pertinent positives fully documented  I have made any additions or clarifications directly to the above note. Agree with note above.  Patient presented with unresponsiveness and altered mental status due to multifactorial encephalopathy from hypoglycemia, hyperosmolar state, hypertensive emergency and multiple lacunar strokes.  He seems to be showing steady neurological improvement and prolonged EEG monitoring on multiple days has not shown definite seizure activity.  Recommend aspirin for stroke prevention and aggressive risk factor modification.  Mobilize out of bed.  Therapy consults.  Encourage oral diet.  Maintain good hydration.  I spoke to the patient's son over the phone who informed me for the last 3 weeks prior to this admission he had noticed cognitive decline and was concerned about dementia and  hence we  will check lab work for reversible causes of memory loss as well.  Discussed with critical care team.This patient is critically ill and at significant risk of neurological worsening, death and care requires constant monitoring of vital signs, hemodynamics,respiratory and cardiac monitoring, extensive  review of multiple databases, frequent neurological assessment, discussion with family, other specialists and medical decision making of high complexity.I have made any additions or clarifications directly to the above note.This critical care time does not reflect procedure time, or teaching time or  supervisory time of PA/NP/Med Resident etc but could involve care discussion time.  I spent 30 minutes of neurocritical care time  in the care of  this patient.       Delia HeadyPramod Salena Ortlieb, MD Medical Director [facility] Pager: [phone] 11/18/2021 4:33 PM

## 2021-11-18 NOTE — Evaluation (Signed)
Clinical/Bedside Swallow Evaluation Patient Details  Name: Manuel Kramer MRN: [id number] Date of Birth: 10-Jul-1956  Today's Date: 11/18/2021 Time: SLP Start Time (ACUTE ONLY): 0850 SLP Stop Time (ACUTE ONLY): 0930 SLP Time Calculation (min) (ACUTE ONLY): 40 min  Past Medical History:  Past Medical History:  Diagnosis Date   Chronic diarrhea    Diabetes mellitus without complication (HCC)    Hypertension    Past Surgical History:  Past Surgical History:  Procedure Laterality Date   COLOSCOPY      POLYP   HPI:  Pt is a 66 year old man admitted after EMS found him unresponsive and hypotensive; Pt was intubated on 11/14/2021-11/17/2021. MRI scan revealed several small infarcts in right hemisphere.    Assessment / Plan / Recommendation  Clinical Impression  Pt demonstrates cognitive impairment and reduced volitional control of tongue and mouth that does impact PO consumption, though airway protection appears good. Pt has constant lingual thrusting and protrustion to the right, but demonstrates ability to lateralize left spontaneously. Pt orally defensive with LUE and leaning left away from PO initially, but also demonstrates interes in PO and will allow total assisted feeding with extra time and visual cues, though restraint may also be needed at times. Pt is somtimes unable to manipulate straw or bolus apparently due to poor intiation or loss of attention, but then resumes motor movement and swallows without struggle. Recommend initiating purees and thins. SLP will f/u for diet advancement and tolerance. Recommend AIR at dc. SLP Visit Diagnosis: Dysphagia, oropharyngeal phase (R13.12)    Aspiration Risk  Mild aspiration risk;Risk for inadequate nutrition/hydration    Diet Recommendation Dysphagia 1 (Puree);Thin liquid   Liquid Administration via: Cup;Straw Medication Administration: Whole meds with liquid Supervision: Staff to assist with self feeding Compensations: Slow rate;Small  sips/bites;Minimize environmental distractions Postural Changes: Seated upright at 90 degrees    Other  Recommendations      Recommendations for follow up therapy are one component of a multi-disciplinary discharge planning process, led by the attending physician.  Recommendations may be updated based on patient status, additional functional criteria and insurance authorization.  Follow up Recommendations Skilled nursing-short term rehab (<3 hours/day)      Assistance Recommended at Discharge PRN  Functional Status Assessment Patient has had a recent decline in their functional status and demonstrates the ability to make significant improvements in function in a reasonable and predictable amount of time.  Frequency and Duration min 2x/week  2 weeks       Prognosis        Swallow Study   General HPI: Pt is a 66 year old man admitted after EMS found him unresponsive and hypotensive; Pt was intubated on 11/14/2021-11/17/2021. MRI scan revealed several small infarcts in right hemisphere. Type of Study: Bedside Swallow Evaluation History of Recent Intubation: No Behavior/Cognition: Alert;Requires cueing Oral Cavity Assessment: Within Functional Limits Oral Care Completed by SLP: No Oral Cavity - Dentition: Edentulous Vision: Impaired for self-feeding Self-Feeding Abilities: Needs assist Patient Positioning: Upright in bed Baseline Vocal Quality: Normal Volitional Cough: Cognitively unable to elicit Volitional Swallow: Unable to elicit    Oral/Motor/Sensory Function Overall Oral Motor/Sensory Function: Within functional limits (Pt does not follow commands, but has good strength with movement)   Ice Chips     Thin Liquid Thin Liquid: Impaired Presentation: Straw Oral Phase Impairments: Poor awareness of bolus    Nectar Thick Nectar Thick Liquid: Not tested   Honey Thick Honey Thick Liquid: Not tested   Puree Puree: Impaired Presentation: Spoon Oral  Phase Impairments: Poor  awareness of bolus;Reduced lingual movement/coordination Oral Phase Functional Implications: Prolonged oral transit   Solid     Solid: Not tested      Claudine Mouton 11/18/2021,12:21 PM

## 2021-11-18 NOTE — Progress Notes (Signed)
Dropped and broke 67mL bottle of lasix on floor during med pass. Pulled a second bottle via override to complete pass.

## 2021-11-18 NOTE — Progress Notes (Signed)
Nutrition Follow-up  DOCUMENTATION CODES:   Severe malnutrition in context of chronic illness  INTERVENTION:   Ensure Enlive po BID, each supplement provides 350 kcal and 20 grams of protein  Encourage PO intake   NUTRITION DIAGNOSIS:   Severe Malnutrition related to chronic illness as evidenced by severe muscle depletion, severe fat depletion. Ongoing.   GOAL:   Patient will meet greater than or equal to 90% of their needs Progressing with diet advancement  MONITOR:   PO intake, Supplement acceptance  REASON FOR ASSESSMENT:   Consult Assessment of nutrition requirement/status  ASSESSMENT:   Pt with PMH of tobacco abuse, DM, HTN, and chronic diarrhea admitted 1/16 after being found by police unresponsive in home; pt with acute metabolic encephalopathy and sepsis.   Pt discussed during ICU rounds and with RN. Pt unable to answer questions.  Diet advanced, pt ate almost 100% of his lunch with assistance from nurse tech.   1/16 intubated 1/18 per MRI pt with several subacute infarcts and numerous microhemorrhages  1/19 extubated 1/20 diet advanced  Medications reviewed and include: SSI, levemir, MVI with minerals   Labs reviewed: K: 3, TG: 219 A1C: 9.9 CBG's: 134-195   Diet Order:   Diet Order             DIET - DYS 1 Room service appropriate? Yes; Fluid consistency: Thin  Diet effective now                   EDUCATION NEEDS:   Not appropriate for education at this time  Skin:  Skin Assessment: Reviewed RN Assessment  Last BM:  200 ml via rectal tube  Height:   Ht Readings from Last 1 Encounters:  11/14/21 5\' 6"  (1.676 m)    Weight:   Wt Readings from Last 1 Encounters:  11/17/21 61 kg    BMI:  Body mass index is 21.71 kg/m.  Estimated Nutritional Needs:   Kcal:  1800-2000  Protein:  90-105 grams  Fluid:  >1.8 L/day  11/19/21., RD, LDN, CNSC See AMiON for contact information

## 2021-11-18 NOTE — Evaluation (Addendum)
Supervised and reviewed by Harlon Ditty MA CCC-SLP   Speech Language Pathology Evaluation Patient Details Name: JAVYON FONTAN MRN: [id number] DOB: 03-20-1956 Today's Date: 11/18/2021 Time: 4259-5638 SLP Time Calculation (min) (ACUTE ONLY): 40 min  Problem List:  Patient Active Problem List   Diagnosis Date Noted   Cerebral embolism with cerebral infarction 11/17/2021   Protein-calorie malnutrition, severe 11/16/2021   Acute encephalopathy 11/14/2021   AKI (acute kidney injury) (HCC)    Hyperglycemia    Hypertensive emergency without congestive heart failure    Sepsis (HCC)    Hyperlipidemia 05/22/2016   Diabetic neuropathy (HCC) 05/22/2016   Other specified diabetes mellitus without complications (HCC) 10/02/2014   Essential hypertension 10/02/2014   Smoking 10/02/2014   Family history of prostate cancer 10/02/2014   Renal insufficiency 10/02/2014   Past Medical History:  Past Medical History:  Diagnosis Date   Chronic diarrhea    Diabetes mellitus without complication (HCC)    Hypertension    Past Surgical History:  Past Surgical History:  Procedure Laterality Date   COLOSCOPY      POLYP   HPI:  Pt is a 66 year old man admitted after EMS found him unresponsive and hypotensive; Pt was intubated on 11/14/2021-11/17/2021. MRI scan revealed several small infarcts in right hemisphere.   Assessment / Plan / Recommendation Clinical Impression  Pt demonstrated rigidity throughout the session, but benefited from adequate pause/wait time. The patient's cognitive-communicative deficits include impaired attention, problem solving, and organization. Pt. attended to, and imitated, some movments/facial expressions; required max assist to attend to functional tasks (i.e., drinking, eating, taking medicine). SLP integrated supports to move in pt.'s right field of vision and this improved pt.'s participation/atention. When SLP moved in visual field, pt participated in high-5's, thumb  war, followed simple commands and problem-solved with mod-max assist. Pt. verbalized "yes" 1x throughout the session, but had limited intentional verbal communication. Care team reports an approximation of /b/, jargon, and vocalizing his name, however attempts at having the pt repeat his name were unsuccessful with multimodal cues (melodic intonation, verbal/visual/tactile). In future speech sessions, plan to address cognitive deficits that facilitate functional communication and modify contextual factors. Continue to incorporate ~1 min wait time in order to improve verbalizations and pt responses.     SLP Assessment  SLP Recommendation/Assessment: Patient needs continued Speech Lanaguage Pathology Services SLP Visit Diagnosis: Dysphagia, unspecified (R13.10);Aphasia (R47.01);Cognitive communication deficit (R41.841)    Recommendations for follow up therapy are one component of a multi-disciplinary discharge planning process, led by the attending physician.  Recommendations may be updated based on patient status, additional functional criteria and insurance authorization.    Follow Up Recommendations  Skilled nursing-short term rehab (<3 hours/day)    Assistance Recommended at Discharge  PRN  Functional Status Assessment Patient has had a recent decline in their functional status and demonstrates the ability to make significant improvements in function in a reasonable and predictable amount of time.  Frequency and Duration min 2x/week  2 weeks      SLP Evaluation Cognition  Overall Cognitive Status: Impaired/Different from baseline Arousal/Alertness: Awake/alert Orientation Level: Disoriented to situation;Disoriented to time;Disoriented to place;Disoriented to person Attention: Alternating Alternating Attention: Impaired Alternating Attention Impairment: Verbal basic;Functional basic Awareness: Impaired Awareness Impairment: Anticipatory impairment Problem Solving: Impaired Problem  Solving Impairment: Verbal basic;Functional basic Executive Function: Self Monitoring;Self Correcting;Sequencing;Organizing;Initiating Sequencing: Impaired Sequencing Impairment: Functional basic Organizing: Impaired Organizing Impairment: Verbal basic;Functional basic Initiating: Impaired Initiating Impairment: Functional basic;Verbal basic Self Monitoring: Impaired Self Monitoring Impairment: Functional basic Self Correcting:  Impaired Self Correcting Impairment: Verbal basic;Functional basic Behaviors: Restless Safety/Judgment: Appears intact       Comprehension  Auditory Comprehension Overall Auditory Comprehension: Impaired Yes/No Questions: Impaired Basic Biographical Questions: 0-25% accurate Basic Immediate Environment Questions: 0-24% accurate Commands: Impaired One Step Basic Commands: 25-49% accurate Complex Commands: 0-24% accurate Conversation: Simple Interfering Components: Attention;Motor planning;Processing speed EffectiveTechniques: Extra processing time;Pausing;Repetition;Slowed speech;Stressing words;Visual/Gestural cues    Expression Expression Primary Mode of Expression: Verbal Verbal Expression Overall Verbal Expression: Impaired Initiation: Impaired Automatic Speech: Social Response Level of Generative/Spontaneous Verbalization: Word Repetition: Impaired Level of Impairment: Word level Naming: Not tested Pragmatics: Unable to assess Interfering Components: Attention Effective Techniques: Sentence completion;Melodic intonation Non-Verbal Means of Communication: Gestures   Oral / Motor  Oral Motor/Sensory Function Overall Oral Motor/Sensory Function: Within functional limits Motor Speech Respiration: Within functional limits Phonation: Normal Resonance: Within functional limits Intelligibility: Not tested            Ezekiel Slocumb, Student-SLP 11/18/2021, 11:29 AM

## 2021-11-18 NOTE — Progress Notes (Cosign Needed)
STROKE TEAM PROGRESS NOTE   INTERVAL HISTORY Patient was seen and assessed this morning with 2 Rns at the bedside. Patient was sitting up in his bed, awake, alert,very slow to response or answer any questions. Able to say his first name only. Did not follow most commands. Uncontrolled mouth movement noted but patient is not on any psychotropic medications.Spontaneously moving all extremities and able to push his right hand down when Dr Leonie Man held his right hand up. His nurse and the primary team were updated on the plan of care.  Vitals:   11/18/21 1300 11/18/21 1400 11/18/21 1500 11/18/21 1600  BP: (!) 147/81 (!) 161/82 (!) 152/90   Pulse: 71 72 77   Resp: 15 16    Temp:    97.8 F (36.6 C)  TempSrc:    Axillary  SpO2: 96% 96% 96%   Weight:      Height:       CBC:  Recent Labs  Lab 11/14/21 0420 11/15/21 0330 11/17/21 0457 11/18/21 0333  WBC 12.3*   < > 10.5 10.0  NEUTROABS 10.8*  --   --   --   HGB 13.8   < > 11.0* 9.6*  HCT 41.1   < > 32.4* 29.4*  MCV 90.5   < > 91.3 96.1  PLT 214   < > 197 222   < > = values in this interval not displayed.   Basic Metabolic Panel:  Recent Labs  Lab 11/17/21 0457 11/17/21 1620 11/18/21 0333  NA 141  --  138  K 3.7  --  3.5  CL 110  --  108  CO2 23  --  23  GLUCOSE 101*  --  202*  BUN 24*  --  29*  CREATININE 1.83*  --  1.81*  CALCIUM 8.4*  --  8.1*  MG 2.2 2.0  --   PHOS 4.0 3.5 3.3   Lipid Panel:  Recent Labs  Lab 11/18/21 0333  CHOL 163  TRIG 169*   163*  HDL 41  CHOLHDL 4.0  VLDL 34  LDLCALC 88   HgbA1c:  Recent Labs  Lab 11/14/21 1219  HGBA1C 9.9*   Urine Drug Screen:  Recent Labs  Lab 11/14/21 0712  LABOPIA NONE DETECTED  COCAINSCRNUR NONE DETECTED  LABBENZ POSITIVE*  AMPHETMU NONE DETECTED  THCU NONE DETECTED  LABBARB NONE DETECTED    Alcohol Level  Recent Labs  Lab 11/14/21 0421  ETH <10    IMAGING past 24 hours MR ANGIO HEAD WO CONTRAST  Result Date: 11/17/2021 CLINICAL DATA:  Follow-up  examination for acute stroke. EXAM: MRA HEAD WITHOUT CONTRAST TECHNIQUE: Angiographic images of the Circle of Willis were acquired using MRA technique without intravenous contrast. COMPARISON:  Brain MRI from 11/16/2021. FINDINGS: Anterior circulation: Examination moderately degraded by motion artifact. Visualized distal cervical segments of the internal carotid arteries are patent with antegrade flow. Apparent stenoses/defects at the cervical petrous junction favored to be artifactual. Petrous segments widely patent. Probable atheromatous irregularity within the carotid siphons with no visible hemodynamically significant stenosis or other abnormality. A1 segments patent bilaterally. Grossly normal anterior communicating artery complex. Anterior cerebral arteries grossly patent and well perfused to their distal aspects without appreciable stenosis. No visible M1 stenosis or occlusion. Normal MCA bifurcations. Distal MCA branches perfused and symmetric. Posterior circulation: Visualized distal V4 segments patent without stenosis. Left vertebral artery dominant. Left PICA patent. Right PICA origin not seen. Basilar patent to its distal aspect without appreciable stenosis. Superior cerebellar  arteries patent bilaterally. Both PCAs primarily supplied via the basilar and are well perfused or distal aspects. Anatomic variants: None significant. Other: No visible aneurysm or other vascular malformation on this motion degraded exam. IMPRESSION: 1. Motion degraded exam. 2. No large vessel occlusion. 3. Probable atheromatous change involving the carotid siphons without significant stenosis. No hemodynamically significant or correctable stenosis seen on this motion degraded exam. Electronically Signed   By: Jeannine Boga M.D.   On: 11/17/2021 22:11   ECHOCARDIOGRAM COMPLETE BUBBLE STUDY  Result Date: 11/18/2021    ECHOCARDIOGRAM REPORT   Patient Name:   Manuel Kramer Date of Exam: 11/18/2021 Medical Rec #:   [id number]        Height:       66.0 in Accession #:    [id number]       Weight:       134.5 lb Date of Birth:  25-Feb-1956        BSA:          1.689 m Patient Age:    66 years         BP:           168/97 mmHg Patient Gender: M                HR:           72 bpm. Exam Location:  Inpatient Procedure: 2D Echo, Cardiac Doppler, Color Doppler, Saline Contrast Bubble Study            and 3D Echo Indications:    Stroke, bubble  History:        Patient has no prior history of Echocardiogram examinations.                 Risk Factors:Hypertension, Current Smoker and Diabetes.  Sonographer:    Glo Herring Referring Phys: [id number] [place]  1. Left ventricular ejection fraction, by estimation, is 60 to 65%. The left ventricle has normal function. The left ventricle has no regional wall motion abnormalities. There is severe left ventricular hypertrophy. Left ventricular diastolic parameters  are consistent with Grade I diastolic dysfunction (impaired relaxation).  2. Right ventricular systolic function is normal. The right ventricular size is normal. Tricuspid regurgitation signal is inadequate for assessing PA pressure.  3. The mitral valve is grossly normal. No evidence of mitral valve regurgitation.  4. The aortic valve is calcified. Aortic valve regurgitation is moderate. No aortic stenosis is present. Aortic regurgitation PHT measures 389 msec. Aortic valve mean gradient measures 8.0 mmHg.  5. The inferior vena cava is normal in size with greater than 50% respiratory variability, suggesting right atrial pressure of 3 mmHg.  6. Agitated saline contrast bubble study was negative, with no evidence of any interatrial shunt. Comparison(s): No prior Echocardiogram. FINDINGS  Left Ventricle: Left ventricular ejection fraction, by estimation, is 60 to 65%. The left ventricle has normal function. The left ventricle has no regional wall motion abnormalities. The left ventricular internal cavity size was  normal in size. There is  severe left ventricular hypertrophy. Left ventricular diastolic parameters are consistent with Grade I diastolic dysfunction (impaired relaxation). Right Ventricle: The right ventricular size is normal. No increase in right ventricular wall thickness. Right ventricular systolic function is normal. Tricuspid regurgitation signal is inadequate for assessing PA pressure. Left Atrium: Left atrial size was normal in size. Right Atrium: Right atrial size was normal in size. Pericardium: There is no evidence of pericardial effusion. Mitral Valve: The mitral valve  is grossly normal. There is mild calcification of the mitral valve leaflet(s). No evidence of mitral valve regurgitation. Tricuspid Valve: The tricuspid valve is grossly normal. Tricuspid valve regurgitation is not demonstrated. Aortic Valve: The aortic valve is calcified. Aortic valve regurgitation is moderate. Aortic regurgitation PHT measures 389 msec. No aortic stenosis is present. Aortic valve mean gradient measures 8.0 mmHg. Aortic valve peak gradient measures 15.5 mmHg. Aortic valve area, by VTI measures 1.39 cm. Pulmonic Valve: The pulmonic valve was grossly normal. Pulmonic valve regurgitation is trivial. Aorta: The aortic root and ascending aorta are structurally normal, with no evidence of dilitation. Venous: The inferior vena cava is normal in size with greater than 50% respiratory variability, suggesting right atrial pressure of 3 mmHg. IAS/Shunts: No atrial level shunt detected by color flow Doppler. Agitated saline contrast was given intravenously to evaluate for intracardiac shunting. Agitated saline contrast bubble study was negative, with no evidence of any interatrial shunt.  LEFT VENTRICLE PLAX 2D LVIDd:         4.40 cm   Diastology LVIDs:         2.80 cm   LV e' medial:    6.31 cm/s LV PW:         2.00 cm   LV E/e' medial:  11.3 LV IVS:        1.80 cm   LV e' lateral:   6.42 cm/s LVOT diam:     1.65 cm   LV E/e'  lateral: 11.1 LV SV:         56 LV SV Index:   33 LVOT Area:     2.14 cm                           3D Volume EF:                          3D EF:        52 %                          LV EDV:       188 ml                          LV ESV:       90 ml                          LV SV:        98 ml RIGHT VENTRICLE             IVC RV Basal diam:  3.80 cm     IVC diam: 1.80 cm RV Mid diam:    2.30 cm RV S prime:     15.90 cm/s LEFT ATRIUM             Index        RIGHT ATRIUM           Index LA diam:        4.10 cm 2.43 cm/m   RA Area:     13.60 cm LA Vol (A2C):   49.1 ml 29.06 ml/m  RA Volume:   33.50 ml  19.83 ml/m LA Vol (A4C):   50.2 ml 29.71 ml/m LA Biplane Vol: 51.0 ml 30.19 ml/m  AORTIC VALVE  PULMONIC VALVE AV Area (Vmax):    1.61 cm      PV Vmax:       1.09 m/s AV Area (Vmean):   1.37 cm      PV Peak grad:  4.8 mmHg AV Area (VTI):     1.39 cm AV Vmax:           197.00 cm/s AV Vmean:          135.000 cm/s AV VTI:            0.401 m AV Peak Grad:      15.5 mmHg AV Mean Grad:      8.0 mmHg LVOT Vmax:         148.00 cm/s LVOT Vmean:        86.500 cm/s LVOT VTI:          0.261 m LVOT/AV VTI ratio: 0.65 AI PHT:            389 msec  AORTA Ao Root diam: 3.20 cm Ao Asc diam:  3.30 cm MITRAL VALVE MV Area (PHT): 3.63 cm    SHUNTS MV Decel Time: 209 msec    Systemic VTI:  0.26 m MV E velocity: 71.30 cm/s  Systemic Diam: 1.65 cm MV A velocity: 83.90 cm/s MV E/A ratio:  0.85 Mary Scientist, physiological signed by Phineas Inches Signature Date/Time: 11/18/2021/8:59:54 AM    Final    VAS US CAROTID  Result Date: 11/18/2021 Carotid Arterial Duplex Study Patient Name:  Manuel Kramer  Date of Exam:   11/18/2021 Medical Rec #: [id number]         Accession #:    [id number] Date of Birth: Sep 19, 1956         Patient Gender: M Patient Age:   4 years Exam Location:  [facility] Procedure:      VAS US CAROTID Referring Phys: Alferd Patee [facility]  --------------------------------------------------------------------------------  Indications:       CVA. Risk Factors:      Hypertension, hyperlipidemia, Diabetes, current smoker. Limitations        Today's exam was limited due to patient movement. Comparison Study:  No prior studies. Performing Technologist: Darlin Coco RDMS, RVT  Examination Guidelines: A complete evaluation includes B-mode imaging, spectral Doppler, color Doppler, and power Doppler as needed of all accessible portions of each vessel. Bilateral testing is considered an integral part of a complete examination. Limited examinations for reoccurring indications may be performed as noted.  Right Carotid Findings: +----------+--------+--------+--------+------------------+--------+             PSV cm/s EDV cm/s Stenosis Plaque Description Comments  +----------+--------+--------+--------+------------------+--------+  CCA Prox   96       16                                             +----------+--------+--------+--------+------------------+--------+  CCA Distal 79       16                                             +----------+--------+--------+--------+------------------+--------+  ICA Prox   83       16                                             +----------+--------+--------+--------+------------------+--------+  ICA Distal 70       26                                             +----------+--------+--------+--------+------------------+--------+  ECA        84                                                      +----------+--------+--------+--------+------------------+--------+ +----------+--------+-------+----------------+-------------------+             PSV cm/s EDV cms Describe         Arm Pressure (mmHG)  +----------+--------+-------+----------------+-------------------+  Subclavian 89               Multiphasic, WNL                      +----------+--------+-------+----------------+-------------------+  +---------+--------+--+--------+--+---------+  Vertebral PSV cm/s 37 EDV cm/s 13 Antegrade  +---------+--------+--+--------+--+---------+  Left Carotid Findings: +----------+--------+--------+--------+-------------------------------+--------+             PSV cm/s EDV cm/s Stenosis Plaque Description              Comments  +----------+--------+--------+--------+-------------------------------+--------+  CCA Prox   110      19                                                          +----------+--------+--------+--------+-------------------------------+--------+  CCA Distal 69       13                                                          +----------+--------+--------+--------+-------------------------------+--------+  ICA Prox   65       23       1-39%    calcific and heterogenous ,                                                      mild                                      +----------+--------+--------+--------+-------------------------------+--------+  ICA Distal 86       21                                                          +----------+--------+--------+--------+-------------------------------+--------+  ECA        65       10                                                          +----------+--------+--------+--------+-------------------------------+--------+ +----------+--------+--------+----------------+-------------------+  PSV cm/s EDV cm/s Describe         Arm Pressure (mmHG)  +----------+--------+--------+----------------+-------------------+  Subclavian 140               Multiphasic, WNL                      +----------+--------+--------+----------------+-------------------+ +---------+--------+--+--------+--+---------+  Vertebral PSV cm/s 38 EDV cm/s 13 Antegrade  +---------+--------+--+--------+--+---------+   Summary: Right Carotid: The extracranial vessels were near-normal with only minimal wall                thickening or plaque. Left Carotid: Velocities in the left ICA are  consistent with a 1-39% stenosis. Vertebrals:  Bilateral vertebral arteries demonstrate antegrade flow. Subclavians: Normal flow hemodynamics were seen in bilateral subclavian              arteries. *See table(s) above for measurements and observations.  Electronically signed by Antony Contras MD on 11/18/2021 at 1:45:56 PM.    Final     PHYSICAL EXAM  Constitutional: Appears well-developed and well-nourished.  Psych: Nods and smiles in response to questions Eyes: No scleral injection HENT: No OP obstrucion MSK: no joint deformities.  Cardiovascular: Normal rate and regular rhythm.  Respiratory: Effort normal, non-labored breathing GI: Soft. No distension. Skin: WDI   Neuro: Mental Status: Patient is awake and alert. Cranial Nerves: II: Pupils are equal, round, and reactive to light.   III,IV, VI: EOMI without ptosis or diploplia.  V, VII: Blinks appropriately VIII: Responds to commands to lift right hand up IX, X: Cough and gag reflex intact XII: Tongue protrudes midline without atrophy or fasciculations.  Motor: Tone is normal. Bulk is normal. Spontaneously  movements of BUE and BLE Sensory: Sensation is symmetric to pain in BLE. Deep Tendon Reflexes: 2+ and symmetric in the biceps, Achilles, and patellae. Plantars: Toes are downgoing bilaterally.       ASSESSMENT/PLAN Manuel Kramer is a 66 y.o. male PMHx of T2DM, HTN, current smoker who was  brought in by [facility] after neighbors reported his medical alarm went off and  was found unresponsive for an unknown down time. Neurology service was consulted for evaluation of his unresponsiveness.   Stroke: Multifocal lacunar infarcts likely from small vessel disease due to dehydration in the setting of hyperosmotic diabetic  A CT head  shows No acute abnormality and atrophy and chronic small vessel ischemia with significant progression from 2019. CTA head & neck shows Atherosclerosis without flow limiting stenosis of  major vessels in the head and neck.  MRA Head IMPRESSION : No large vessel occlusion.  Probable atheromatous change involving the carotid siphons without significant stenosis. No hemodynamically significant or correctable stenosis seen on this motion degraded exam.   MRI Head IMPRESSION: Subcentimeter acute infarcts within the right frontal operculum, genu of right internal capsule, right frontoparietal periventricular white matter, and posterior right frontal lobe subcortical white Matter.  6 mm subacute infarct within right frontoparietal periventricular white matter.   Background moderate chronic small vessel ischemic changes within the cerebral white matter, including chronic lacunar infarcts. Mild chronic small vessel ischemic changes also present within the pons.  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  Fairly numerous supratentorial and infratentorial chronic parenchymal microhemorrhages. 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 Mild-to-moderate generalized cerebral atrophy. Comparatively mild cerebellar atrophy.  2D Echo with bubble study done on 11/18/2021 shows Left ventricular ejection fraction, by estimation, is 60 to 65%. The left ventricle has normal  function. The left ventricle has no regional wall motion abnormalities. There is severe left ventricular hypertrophy. Left ventricular diastolic parameters are consistent with Grade I diastolic dysfunction (impaired relaxation).Agitated saline contrast bubble study was negative, with no evidence of any interatrial shunt.   Carotid ultrasound done on 11/18/2021 shows Right Carotid: The extracranial vessels were near-normal with only minimal wall thickening or plaque. Left Carotid: Velocities in the left ICA are consistent with a 1-39% stenosis. Vertebrals: Bilateral vertebral arteries demonstrate antegrade flow. Subclavians: Normal flow hemodynamics were seen in bilateral  subclavian arteries.  EKG shows sinus Tachycardia.  LDL 88 HgbA1c 9.9 VTE prophylaxis - Heparin 5000 units Subq every 8 hours     Diet   DIET - DYS 1 Room service appropriate? Yes; Fluid consistency: Thin   No antithrombotic prior to admission, now on aspirin 81 mg daily.  Therapy recommendations: CIR Disposition:  evaluation still in process, not medically stable for discharge at this time.  Hypertension Home meds:  Lisinopril 40mg  daily, catapres 0.1mg  bid, Norvasc 10mg  po daily, HCTZ 25mg  daily Stable Permissive hypertension (OK if < 160/90) but gradually normalize in 5-7 days Long-term BP goal normotensive  Hyperlipidemia Home meds: Lipitor 10mg  at bedtime resumed in hospital LDL 88, goal < 70 increase Lipitor from 10mg  to 40 mg po q HS High intensity statin  indicated  Continue statin at discharge  Diabetes type II UnControlled Home meds:  Lantus 30-64units Subq at bedtime and Glyzambi 1 tablet daily HgbA1c 9.9, goal < 7.0 CBGs Recent Labs    11/18/21 0812 11/18/21 1152 11/18/21 1601  GLUCAP 159* 134* 157*    SSI  Other Stroke Risk Factors  Cigarette smoker and advised to stop smoking  Other Active Problems  Acute metabolic encephalopathy Stop acyclovir as CSF studies are negative  Hypertensive emergency Continue Norvasc, clonidine.and  hydralazine. Critical care following   Volume overload Lasix 40 mg x 1 per critical care team   Hospital day # 4  Pt seen by attending MD and stroke team. Note/plan to be edited by MD as needed.   11/18/2021  2:41 PM     To contact Stroke Continuity provider, please refer to [email or web address] After hours, contact General Neurology

## 2021-11-18 NOTE — Progress Notes (Signed)
Carotid duplex bilateral study completed.   Please see CV Proc for preliminary results.   Kellsie Grindle, RDMS, RVT  

## 2021-11-18 NOTE — Evaluation (Signed)
Occupational Therapy Evaluation Patient Details Name: Manuel Kramer MRN: [id number] DOB: [birth year] Today's Date: 11/18/2021   History of Present Illness 66 y/o male presented to ED on 11/14/21 after being found in his home altered and combative. Unresponsive in ED. EEG suggestive of severe encephalopathy. 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 Intubated 1/16-1/19. PMH: HTN, DM   Clinical Impression   Prior to admission Pt was independent in ADL and mobility. He was managing his own finances and medicine. 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 Today he presents with aphasia, movement that presents as apraxic, deficits in strength, coordination, proprioception. Multimodal cues used throughout. Processing delay of approx 10-15 seconds with command following at approx 10% accuracy. L side seems weaker than the right, but sturggled to truly evaluate due to cognition. Max A +2 to total to sit EOB, once seated EOB Pt with right lateral lean and STRONG posterior lean. At this time recommending AIR level rehab post-acute to maximize safety and independence in ADL and transfers and address cognitive deficits. OT will continue to follow acutely as well, has suppor from his son and daughter. (Spoke with son on the phone)     Recommendations for follow up therapy are one component of a multi-disciplinary discharge planning process, led by the attending physician.  Recommendations may be updated based on patient status, additional functional criteria and insurance authorization.   Follow Up Recommendations  Acute inpatient rehab (3hours/day)    Assistance Recommended at Discharge Frequent or constant Supervision/Assistance  Patient can return home with the following Two people to help with walking and/or transfers;Two people to help with  bathing/dressing/bathroom;Direct supervision/assist for medications management;Direct supervision/assist for financial management;Assistance with feeding;Assistance with cooking/housework;Assist for transportation;Help with stairs or ramp for entrance    Functional Status Assessment  Patient has had a recent decline in their functional status and demonstrates the ability to make significant improvements in function in a reasonable and predictable amount of time.  Equipment Recommendations  Other (comment) (defer to next venue of care at this time)    Recommendations for Other Services Rehab consult;PT consult;Speech consult     Precautions / Restrictions Precautions Precautions: Fall Precaution Comments: aphasia Restrictions Weight Bearing Restrictions: No      Mobility Bed Mobility Overal bed mobility: Needs Assistance Bed Mobility: Supine to Sit, Sit to Supine     Supine to sit: Max assist, +2 for physical assistance, +2 for safety/equipment, HOB elevated Sit to supine: Total assist, +2 for physical assistance, +2 for safety/equipment   General bed mobility comments: heavy of the bed pad to facilitate, Pt non-combative during movement, max A +2 to total A for all aspects    Transfers                   General transfer comment: NT this session      Balance Overall balance assessment: Needs assistance Sitting-balance support: Bilateral upper extremity supported, Feet supported Sitting balance-Leahy Scale: Zero Sitting balance - Comments: dependent on external support Postural control: Posterior lean, Right lateral lean                                 ADL either performed or assessed with clinical judgement   ADL Overall ADL's : Needs assistance/impaired Eating/Feeding: NPO  Grooming: Moderate assistance;Maximal assistance Grooming Details (indicate cue type and reason): can complete hand over hand, initially fearful and resistant Upper Body  Bathing: Maximal assistance   Lower Body Bathing: Total assistance   Upper Body Dressing : Maximal assistance   Lower Body Dressing: Total assistance   Toilet Transfer: Total assistance   Toileting- Clothing Manipulation and Hygiene: Total assistance       Functional mobility during ADLs: Total assistance General ADL Comments: cognition, aphasia, apraxic like movements     Vision   Vision Assessment?: Vision impaired- to be further tested in functional context Additional Comments: Pt with R gaze preference, however will turn and look with auditory stimulis, or track therapists past midline - will continue to evaluate     Perception     Praxis      Pertinent Vitals/Pain Pain Assessment Pain Assessment: Faces Faces Pain Scale: No hurt Pain Intervention(s): Monitored during session, Repositioned     Hand Dominance Right   Extremity/Trunk Assessment Upper Extremity Assessment Upper Extremity Assessment: RUE deficits/detail;LUE deficits/detail RUE Deficits / Details: generalized weakness, does attempt to make thumbs up, grossly 2+ RUE Coordination: decreased fine motor;decreased gross motor LUE Deficits / Details: generalized weakness and will not move to command, but observed in the bed reaching across midline, uncoordinated but able to move more spontaneously than when asked to reach/hold for MMT. when presented with functional items to take he did not initally try and reach for them LUE Sensation: decreased proprioception LUE Coordination: decreased fine motor;decreased gross motor   Lower Extremity Assessment Lower Extremity Assessment: Defer to PT evaluation       Communication Communication Communication: Receptive difficulties;Expressive difficulties   Cognition Arousal/Alertness: Awake/alert Behavior During Therapy: Restless, Agitated Overall Cognitive Status: Impaired/Different from baseline Area of Impairment: Following commands, Awareness, Problem  solving                       Following Commands: Follows one step commands with increased time, Follows one step commands inconsistently   Awareness: Intellectual Problem Solving: Slow processing, Decreased initiation, Difficulty sequencing, Requires verbal cues, Requires tactile cues General Comments: Pt 99% non-verbal throughout session, did say one untelligible word. 10-15 second processing delay, trying to follow commands with R arm to give thumbs up. Seems fearful at times, follows commands 10% of the time even with multimodal cues     General Comments  VSS on RA throughout session, BP running high but within limts set by MD; LUE with noted edema, RN aware    Exercises Exercises: Other exercises Other Exercises Other Exercises: ROM for LUE to address edema   Shoulder Instructions      Home Living Family/patient expects to be discharged to:: Private residence Living Arrangements: Alone   Type of Home: Apartment Home Access: Stairs to enter [employer] of Steps: 2 flights Entrance Stairs-Rails: Right Home Layout: One level     Bathroom Shower/Tub: Chief Strategy Officer: Standard     Home Equipment: None   Additional Comments: former Radiographer, therapeutic Chartered loss adjuster      Prior Functioning/Environment Prior Level of Function : Independent/Modified Independent (does not drive or work)             Mobility Comments: no DME, managing 2 flights of stairs without problem ADLs Comments: managing own finances and medicine        OT Problem List: Decreased strength;Decreased range of motion;Decreased activity tolerance;Impaired balance (sitting and/or standing);Impaired vision/perception;Decreased coordination;Decreased cognition;Decreased safety awareness;Impaired UE functional  use      OT Treatment/Interventions: Self-care/ADL training;Neuromuscular education;DME and/or AE instruction;Manual therapy;Therapeutic activities;Cognitive  remediation/compensation;Patient/family education;Balance training    OT Goals(Current goals can be found in the care plan section) Acute Rehab OT Goals Patient Stated Goal: none stated OT Goal Formulation: Patient unable to participate in goal setting Time For Goal Achievement: 12/02/21 Potential to Achieve Goals: Good  OT Frequency: Min 2X/week    Co-evaluation PT/OT/SLP Co-Evaluation/Treatment: Yes Reason for Co-Treatment: Complexity of the patient's impairments (multi-system involvement);For patient/therapist safety;To address functional/ADL transfers;Necessary to address cognition/behavior during functional activity PT goals addressed during session: Mobility/safety with mobility;Balance;Strengthening/ROM OT goals addressed during session: ADL's and self-care;Strengthening/ROM      AM-PAC OT "6 Clicks" Daily Activity     Outcome Measure Help from another person eating meals?: Total Help from another person taking care of personal grooming?: Total Help from another person toileting, which includes using toliet, bedpan, or urinal?: Total Help from another person bathing (including washing, rinsing, drying)?: Total Help from another person to put on and taking off regular upper body clothing?: Total Help from another person to put on and taking off regular lower body clothing?: Total 6 Click Score: 6   End of Session Nurse Communication: Mobility status  Activity Tolerance: Patient tolerated treatment well Patient left: in bed;with bed alarm set (ICU bed in chair position)  OT Visit Diagnosis: Other symptoms and signs involving the nervous system (R29.898);Other symptoms and signs involving cognitive function;Cognitive communication deficit (R41.841) Symptoms and signs involving cognitive functions: Cerebral infarction                Time: [id number] OT Time Calculation (min): 21 min Charges:  OT General Charges $OT Visit: 1 Visit OT Evaluation $OT Eval Moderate Complexity: 1  Mod  Nyoka CowdenLaura H OTR/L Acute Rehabilitation Services Pager: [phone] Office: [phone]  Evern BioLaura J Allani Reber 11/18/2021, 12:33 PM

## 2021-11-19 DIAGNOSIS — R739 Hyperglycemia, unspecified: Secondary | ICD-10-CM

## 2021-11-19 DIAGNOSIS — N179 Acute kidney failure, unspecified: Secondary | ICD-10-CM | POA: Diagnosis not present

## 2021-11-19 DIAGNOSIS — I6330 Cerebral infarction due to thrombosis of unspecified cerebral artery: Secondary | ICD-10-CM

## 2021-11-19 DIAGNOSIS — G9340 Encephalopathy, unspecified: Secondary | ICD-10-CM | POA: Diagnosis not present

## 2021-11-19 DIAGNOSIS — I161 Hypertensive emergency: Secondary | ICD-10-CM | POA: Diagnosis not present

## 2021-11-19 DIAGNOSIS — I6310 Cerebral infarction due to embolism of unspecified precerebral artery: Secondary | ICD-10-CM | POA: Diagnosis not present

## 2021-11-19 DIAGNOSIS — E11649 Type 2 diabetes mellitus with hypoglycemia without coma: Secondary | ICD-10-CM

## 2021-11-19 LAB — CBC
HCT: 30 % — ABNORMAL LOW (ref 39.0–52.0)
Hemoglobin: 10.1 g/dL — ABNORMAL LOW (ref 13.0–17.0)
MCH: 31.3 pg (ref 26.0–34.0)
MCHC: 33.7 g/dL (ref 30.0–36.0)
MCV: 92.9 fL (ref 80.0–100.0)
Platelets: 286 10*3/uL (ref 150–400)
RBC: 3.23 MIL/uL — ABNORMAL LOW (ref 4.22–5.81)
RDW: 12.7 % (ref 11.5–15.5)
WBC: 11 10*3/uL — ABNORMAL HIGH (ref 4.0–10.5)
nRBC: 0.2 % (ref 0.0–0.2)

## 2021-11-19 LAB — CULTURE, BLOOD (ROUTINE X 2)
Culture: NO GROWTH
Culture: NO GROWTH
Special Requests: ADEQUATE
Special Requests: ADEQUATE

## 2021-11-19 LAB — BASIC METABOLIC PANEL
Anion gap: 5 (ref 5–15)
BUN: 29 mg/dL — ABNORMAL HIGH (ref 8–23)
CO2: 24 mmol/L (ref 22–32)
Calcium: 8.5 mg/dL — ABNORMAL LOW (ref 8.9–10.3)
Chloride: 109 mmol/L (ref 98–111)
Creatinine, Ser: 1.62 mg/dL — ABNORMAL HIGH (ref 0.61–1.24)
GFR, Estimated: 47 mL/min — ABNORMAL LOW (ref >60)
Glucose, Bld: 190 mg/dL — ABNORMAL HIGH (ref 70–99)
Potassium: 3.7 mmol/L (ref 3.5–5.1)
Sodium: 138 mmol/L (ref 135–145)

## 2021-11-19 LAB — GLUCOSE, CAPILLARY
Glucose-Capillary: 264 mg/dL — ABNORMAL HIGH (ref 70–99)
Glucose-Capillary: 147 mg/dL — ABNORMAL HIGH (ref 70–99)

## 2021-11-19 LAB — RPR: RPR Ser Ql: NONREACTIVE

## 2021-11-19 MED ORDER — BISACODYL 10 MG RE SUPP
10.0000 mg | Freq: Once | RECTAL | Status: DC
  Filled 2021-11-19: qty 1

## 2021-11-19 MED ORDER — METOPROLOL TARTRATE 25 MG PO TABS
25.0000 mg | ORAL_TABLET | Freq: Two times a day (BID) | ORAL | Status: DC
  Administered 2021-11-19 – 2021-11-22 (×6): 25 mg via ORAL
  Filled 2021-11-19 (×7): qty 1

## 2021-11-19 MED ORDER — OLANZAPINE 5 MG PO TBDP
5.0000 mg | ORAL_TABLET | Freq: Once | ORAL | Status: AC
  Administered 2021-11-19: 5 mg via ORAL
  Filled 2021-11-19: qty 1

## 2021-11-19 MED ORDER — QUETIAPINE FUMARATE 25 MG PO TABS
25.0000 mg | ORAL_TABLET | Freq: Every day | ORAL | Status: DC
  Administered 2021-11-19: 25 mg via ORAL
  Filled 2021-11-19: qty 1

## 2021-11-19 MED ORDER — LABETALOL HCL 5 MG/ML IV SOLN
10.0000 mg | Freq: Once | INTRAVENOUS | Status: AC
  Administered 2021-11-19: 10 mg via INTRAVENOUS
  Filled 2021-11-19: qty 4

## 2021-11-19 MED ORDER — METOPROLOL TARTRATE 5 MG/5ML IV SOLN
2.5000 mg | INTRAVENOUS | Status: DC | PRN
  Administered 2021-11-19 (×2): 2.5 mg via INTRAVENOUS
  Filled 2021-11-19 (×2): qty 5

## 2021-11-19 NOTE — Progress Notes (Signed)
Patient refused 1200 CBG check despite repeated attempts from me and the NT.  Sent a secure chat to both Dr. Roda Shutters and Dr. Alphonsa Gin to alert them to this. Will continue to observe and act accordingly.

## 2021-11-19 NOTE — Progress Notes (Addendum)
Patient is refusing to let RN or NT take blood sugar or temperature this morning.  He also refused a bath when offered one.  Patient is confused but is not agitated or violent.  This RN will attempt to get patient to take his 1000 medications. Attending MD Riswan is aware.

## 2021-11-19 NOTE — Progress Notes (Addendum)
PROGRESS NOTE    Manuel Kramer   ZOX:[id number]  DOB: 1956-03-01  DOA: 11/14/2021 PCP: Vonna Drafts, FNP   Brief Narrative:  Manuel Kramer 66 year old male who lives alone at home he has a documented history of diabetes and hypertension.  Brought to the hospital by EMS after neighbors called 911.  Agitated and combative when EMS arrived.  The ED:  Left-sided gaze preference.  GCS 3 and emergently intubated  Bolused with Keppra Placed on Cleviprex for hypertension.  Was extubated on 1/19 and transferred to [facility] on 1/[facility].   Subjective: He is confused at this time.  Nurses state that he is refusing medications.    Assessment & Plan:   Principal Problem:  Multifocal lacunar infarcts-hypertensive emergency - Suspected to be from small vessel disease - Neurology is recommending aspirin 81 mg daily, Lipitor 40 mg at bedtime -Currently prescribed hydralazine, amlodipine and clonidine -Severe LVH noted on echo -SLP is recommending a D3 diet with whole meds  Active Problems:   Acute encephalopathy -Febrile in the ED-work-up does not suggest an neurological infection work-up has been negative- currently not on antibiotics or antivirals -Remains confused possibly due to acute infarcts -Per RN he is not sleeping at night -he received Zyprexa yesterday and this was unhelpful - remains agitated and has mitts on - We will try Seroquel tonight and also transfer him out of the ICU  Diabetes mellitus - Hemoglobin A1c is 9.9 - Currently receiving Levemir and sliding scale insulin but declining to take this morning  Urinary retention - Patient is not voiding and RNs have had to be in and out cath multiple times - We will place a Foley -Creatinine is elevated but improving but may improve further after Foley placed  Recent diarrhea? Has a rectal pouch-hold laxatives and follow-up on stool output   Disposition: Patient lives alone, not sure if he is a  candidate to rehabilitate with this level of confusion, also intermittently refusing medications   DVT prophylaxis: SCDs Code Status: Full code Family Communication:  Level of Care: Level of care: ICU Disposition Plan:  Status is: Inpatient  Remains inpatient appropriate because: Follow-up on mental status & renal function      Consultants:  Neurology  Antimicrobials:  Anti-infectives (From admission, onward)    Start     Dose/Rate Route Frequency Ordered Stop   11/15/21 1000  vancomycin (VANCOREADY) IVPB 750 mg/150 mL  Status:  Discontinued        750 mg 150 mL/hr over 60 Minutes Intravenous Every 24 hours 11/14/21 0556 11/16/21 1045   11/14/21 2200  ceFEPIme (MAXIPIME) 2 g in sodium chloride 0.9 % 100 mL IVPB  Status:  Discontinued        2 g 200 mL/hr over 30 Minutes Intravenous Every 12 hours 11/14/21 0556 11/16/21 1045   11/14/21 1945  acyclovir (ZOVIRAX) 600 mg in dextrose 5 % 100 mL IVPB  Status:  Discontinued        10 mg/kg  60 kg 112 mL/hr over 60 Minutes Intravenous Every 12 hours 11/14/21 1847 11/18/21 0927   11/14/21 0530  ceFEPIme (MAXIPIME) 2 g in sodium chloride 0.9 % 100 mL IVPB        2 g 200 mL/hr over 30 Minutes Intravenous  Once 11/14/21 0523 11/14/21 0633   11/14/21 0530  metroNIDAZOLE (FLAGYL) IVPB 500 mg        500 mg 100 mL/hr over 60 Minutes Intravenous  Once 11/14/21 0523 11/14/21 8119  11/14/21 0530  vancomycin (VANCOCIN) IVPB 1000 mg/200 mL premix        1,000 mg 200 mL/hr over 60 Minutes Intravenous  Once 11/14/21 0523 11/14/21 0811        Objective: Vitals:   11/19/21 0500 11/19/21 0600 11/19/21 0700 11/19/21 0800  BP: (!) 160/88 (!) 162/88 (!) 159/106 (!) 149/80  Pulse: 79 75 77 81  Resp: _0 Temp:      TempSrc:      SpO2: 98% 99% 97% 98%  Weight:      Height:        Intake/Output Summary (Last 24 hours) at 11/19/2021 0958 Last data filed at 11/19/2021 0400 Gross per 24 hour  Intake 842.35 ml  Output 1200 ml  Net  -357.65 ml   Filed Weights   11/15/21 0500 11/16/21 0500 11/17/21 0500  Weight: 61.1 kg 61 kg 61 kg    Examination: General exam: Appears comfortable  HEENT: PERRLA, oral mucosa moist, no sclera icterus or thrush Respiratory system: Clear to auscultation. Respiratory effort normal. Cardiovascular system: S1 & S2 heard, RRR.   Gastrointestinal system: Abdomen soft, non-tender, nondistended. Normal bowel sounds. Central nervous system: Alert and oriented only to person.  Follows commands and moves all extremities  Extremities: No cyanosis, clubbing or edema Skin: No rashes or ulcers Psychiatry:  Mood & affect appropriate.     Data Reviewed: I have personally reviewed following labs and imaging studies  CBC: Recent Labs  Lab 11/14/21 0420 11/15/21 0330 11/16/21 0721 11/17/21 0457 11/18/21 0333 11/18/21 1837 11/19/21 0235  WBC 12.3*   < > 12.9* 10.5 10.0 12.2* 11.0*  NEUTROABS 10.8*  --   --   --   --   --   --   HGB 13.8   < > 10.3* 11.0* 9.6* 11.0* 10.1*  HCT 41.1   < > 30.8* 32.4* 29.4* 32.7* 30.0*  MCV 90.5   < > 90.6 91.3 96.1 92.9 92.9  PLT 214   < > 205 197 222 296 286   < > = values in this interval not displayed.   Basic Metabolic Panel: Recent Labs  Lab 11/15/21 0632 11/16/21 0721 11/16/21 1646 11/17/21 0457 11/17/21 1620 11/18/21 0333 11/19/21 0235  NA 140 137  --  141  --  138 138  K 3.0* 3.0*  --  3.7  --  3.5 3.7  CL 104 106  --  110  --  108 109  CO2 23 21*  --  23  --  23 24  GLUCOSE 111* 217*  --  101*  --  202* 190*  BUN 17 24*  --  24*  --  29* 29*  CREATININE 2.05* 2.06*  --  1.83*  --  1.81* 1.62*  CALCIUM 8.7* 8.3*  --  8.4*  --  8.1* 8.5*  MG 1.8 2.1 2.1 2.2 2.0  --   --   PHOS 3.0 4.1 4.2 4.0 3.5 3.3  --    GFR: Estimated Creatinine Clearance: 39.7 mL/min (A) (by C-G formula based on SCr of 1.62 mg/dL (H)). Liver Function Tests: Recent Labs  Lab 11/14/21 0420  AST 22  ALT 23  ALKPHOS 97  BILITOT 1.0  PROT 5.9*  ALBUMIN 3.0*    No results for input(s): LIPASE, AMYLASE in the last 168 hours. Recent Labs  Lab 11/14/21 0702 11/16/21 0721  AMMONIA 51* 15   Coagulation Profile: Recent Labs  Lab 11/14/21 0420  INR 0.8  Cardiac Enzymes: No results for input(s): CKTOTAL, CKMB, CKMBINDEX, TROPONINI in the last 168 hours. BNP (last 3 results) No results for input(s): PROBNP in the last 8760 hours. HbA1C: No results for input(s): HGBA1C in the last 72 hours. CBG: Recent Labs  Lab 11/18/21 0328 11/18/21 0812 11/18/21 1152 11/18/21 1601 11/18/21 2131  GLUCAP 188* 159* 134* 157* 188*   Lipid Profile: Recent Labs    11/18/21 0333  CHOL 163  HDL 41  LDLCALC 88  TRIG 169*   163*  CHOLHDL 4.0   Thyroid Function Tests: Recent Labs    11/18/21 1837  TSH 2.064   Anemia Panel: Recent Labs    11/18/21 1837  VITAMINB12 540  FOLATE 7.7   Urine analysis:    Component Value Date/Time   COLORURINE YELLOW 11/14/2021 [place] 11/14/2021 0712   LABSPEC 1.010 11/14/2021 0712   PHURINE 7.0 11/14/2021 0712   GLUCOSEU >=500 (A) 11/14/2021 0712   HGBUR LARGE (A) 11/14/2021 0712   BILIRUBINUR NEGATIVE 11/14/2021 0712   KETONESUR NEGATIVE 11/14/2021 0712   PROTEINUR >300 (A) 11/14/2021 0712   UROBILINOGEN 0.2 01/01/2015 1118   NITRITE NEGATIVE 11/14/2021 0712   LEUKOCYTESUR NEGATIVE 11/14/2021 0712   Sepsis Labs: _0 (procalcitonin:4,lacticidven:4) ) Recent Results (from the past 240 hour(s))  Blood Culture (routine x 2)     Status: None   Collection Time: 11/14/21  4:09 AM   Specimen: BLOOD RIGHT FOREARM  Result Value Ref Range Status   Specimen Description BLOOD RIGHT FOREARM  Final   Special Requests   Final    BOTTLES DRAWN AEROBIC AND ANAEROBIC Blood Culture adequate volume   Culture   Final    NO GROWTH 5 DAYS Performed at [facility], [place] [address], [place], [place] [address]    Report Status 11/19/2021 FINAL  Final  Resp Panel by RT-PCR (Flu A&B,  Covid) Nasopharyngeal Swab     Status: None   Collection Time: 11/14/21  4:44 AM   Specimen: Nasopharyngeal Swab; Nasopharyngeal(NP) swabs in vial transport medium  Result Value Ref Range Status   SARS Coronavirus 2 by RT PCR NEGATIVE NEGATIVE Final    Comment: (NOTE) SARS-CoV-2 target nucleic acids are NOT DETECTED.  The SARS-CoV-2 RNA is generally detectable in upper respiratory specimens during the acute phase of infection. The lowest concentration of SARS-CoV-2 viral copies this assay can detect is 138 copies/mL. A negative result does not preclude SARS-Cov-2 infection and should not be used as the sole basis for treatment or other patient management decisions. A negative result may occur with  improper specimen collection/handling, submission of specimen other than nasopharyngeal swab, presence of viral mutation(s) within the areas targeted by this assay, and inadequate number of viral copies(<138 copies/mL). A negative result must be combined with clinical observations, patient history, and epidemiological information. The expected result is Negative.  Fact Sheet for Patients:  EntrepreneurPulse.com.au  Fact Sheet for Healthcare Providers:  IncredibleEmployment.be  This test is no t yet approved or cleared by the [place] FDA and  has been authorized for detection and/or diagnosis of SARS-CoV-2 by FDA under an Emergency Use Authorization (EUA). This EUA will remain  in effect (meaning this test can be used) for the duration of the COVID-19 declaration under Section 564(b)(1) of the Act, 21 U.S.C.section 360bbb-3(b)(1), unless the authorization is terminated  or revoked sooner.       Influenza A by PCR NEGATIVE NEGATIVE Final   Influenza B by PCR NEGATIVE NEGATIVE Final  Comment: (NOTE) The Xpert Xpress SARS-CoV-2/FLU/RSV plus assay is intended as an aid in the diagnosis of influenza from Nasopharyngeal swab specimens and should  not be used as a sole basis for treatment. Nasal washings and aspirates are unacceptable for Xpert Xpress SARS-CoV-2/FLU/RSV testing.  Fact Sheet for Patients: EntrepreneurPulse.com.au  Fact Sheet for Healthcare Providers: IncredibleEmployment.be  This test is not yet approved or cleared by the [place] FDA and has been authorized for detection and/or diagnosis of SARS-CoV-2 by FDA under an Emergency Use Authorization (EUA). This EUA will remain in effect (meaning this test can be used) for the duration of the COVID-19 declaration under Section 564(b)(1) of the Act, 21 U.S.C. section 360bbb-3(b)(1), unless the authorization is terminated or revoked.  Performed at [facility], [place] [address] [place], [place] [address]   Blood Culture (routine x 2)     Status: None   Collection Time: 11/14/21  7:28 AM   Specimen: Site Not Specified; Blood  Result Value Ref Range Status   Specimen Description SITE NOT SPECIFIED  Final   Special Requests   Final    BOTTLES DRAWN AEROBIC AND ANAEROBIC Blood Culture adequate volume   Culture   Final    NO GROWTH 5 DAYS Performed at [facility], [place] [address] [place], [place] [address]    Report Status 11/19/2021 FINAL  Final  MRSA Next Gen by PCR, Nasal     Status: None   Collection Time: 11/14/21  1:14 PM   Specimen: Nasal Mucosa; Nasal Swab  Result Value Ref Range Status   MRSA by PCR Next Gen NOT DETECTED NOT DETECTED Final    Comment: (NOTE) The GeneXpert MRSA Assay (FDA approved for NASAL specimens only), is one component of a comprehensive MRSA colonization surveillance program. It is not intended to diagnose MRSA infection nor to guide or monitor treatment for MRSA infections. Test performance is not FDA approved in patients less than 46 years old. Performed at [facility], [place] [address] [place], [place] [address]   CSF culture w Stat Gram Stain     Status: None    Collection Time: 11/14/21  2:54 PM   Specimen: CSF; Cerebrospinal Fluid  Result Value Ref Range Status   Specimen Description CSF  Final   Special Requests NONE  Final   Gram Stain   Final    WBC PRESENT,BOTH PMN AND MONONUCLEAR NO ORGANISMS SEEN    Culture   Final    NO GROWTH 3 DAYS Performed at [facility], [place] [address], [place],  [address]    Report Status 11/18/2021 FINAL  Final  VZV PCR, CSF     Status: None   Collection Time: 11/14/21  5:08 PM   Specimen: CSF; Cerebrospinal Fluid  Result Value Ref Range Status   VZV PCR, CSF Negative Negative Final    Comment: (NOTE) No Varicella Zoster Virus DNA detected. Performed At: [facility] [place], [place] [id number] Rush Farmer MD IP:[id number]          Radiology Studies: MR ANGIO HEAD WO CONTRAST  Result Date: 11/17/2021 CLINICAL DATA:  Follow-up examination for acute stroke. EXAM: MRA HEAD WITHOUT CONTRAST TECHNIQUE: Angiographic images of the Circle of Willis were acquired using MRA technique without intravenous contrast. COMPARISON:  Brain MRI from 11/16/2021. FINDINGS: Anterior circulation: Examination moderately degraded by motion artifact. Visualized distal cervical segments of the internal carotid arteries are patent with antegrade flow. Apparent stenoses/defects at the  cervical petrous junction favored to be artifactual. Petrous segments widely patent. Probable atheromatous irregularity within the carotid siphons with no visible hemodynamically significant stenosis or other abnormality. A1 segments patent bilaterally. Grossly normal anterior communicating artery complex. Anterior cerebral arteries grossly patent and well perfused to their distal aspects without appreciable stenosis. No visible M1 stenosis or occlusion. Normal MCA bifurcations. Distal MCA branches perfused and symmetric. Posterior circulation: Visualized distal V4 segments patent without stenosis. Left vertebral  artery dominant. Left PICA patent. Right PICA origin not seen. Basilar patent to its distal aspect without appreciable stenosis. Superior cerebellar arteries patent bilaterally. Both PCAs primarily supplied via the basilar and are well perfused or distal aspects. Anatomic variants: None significant. Other: No visible aneurysm or other vascular malformation on this motion degraded exam. IMPRESSION: 1. Motion degraded exam. 2. No large vessel occlusion. 3. Probable atheromatous change involving the carotid siphons without significant stenosis. No hemodynamically significant or correctable stenosis seen on this motion degraded exam. Electronically Signed   By: Jeannine Boga M.D.   On: 11/17/2021 22:11   ECHOCARDIOGRAM COMPLETE BUBBLE STUDY  Result Date: 11/18/2021    ECHOCARDIOGRAM REPORT   Patient Name:   Manuel Kramer Date of Exam: 11/18/2021 Medical Rec #:  [id number]        Height:       66.0 in Accession #:    [id number]       Weight:       134.5 lb Date of Birth:  Jul 17, 1956        BSA:          1.689 m Patient Age:    53 years         BP:           168/97 mmHg Patient Gender: M                HR:           72 bpm. Exam Location:  Inpatient Procedure: 2D Echo, Cardiac Doppler, Color Doppler, Saline Contrast Bubble Study            and 3D Echo Indications:    Stroke, bubble  History:        Patient has no prior history of Echocardiogram examinations.                 Risk Factors:Hypertension, Current Smoker and Diabetes.  Sonographer:    Glo Herring Referring Phys: [id number] [place]  1. Left ventricular ejection fraction, by estimation, is 60 to 65%. The left ventricle has normal function. The left ventricle has no regional wall motion abnormalities. There is severe left ventricular hypertrophy. Left ventricular diastolic parameters  are consistent with Grade I diastolic dysfunction (impaired relaxation).  2. Right ventricular systolic function is normal. The right ventricular  size is normal. Tricuspid regurgitation signal is inadequate for assessing PA pressure.  3. The mitral valve is grossly normal. No evidence of mitral valve regurgitation.  4. The aortic valve is calcified. Aortic valve regurgitation is moderate. No aortic stenosis is present. Aortic regurgitation PHT measures 389 msec. Aortic valve mean gradient measures 8.0 mmHg.  5. The inferior vena cava is normal in size with greater than 50% respiratory variability, suggesting right atrial pressure of 3 mmHg.  6. Agitated saline contrast bubble study was negative, with no evidence of any interatrial shunt. Comparison(s): No prior Echocardiogram. FINDINGS  Left Ventricle: Left ventricular ejection fraction, by estimation, is 60 to 65%. The left ventricle has normal function. The  left ventricle has no regional wall motion abnormalities. The left ventricular internal cavity size was normal in size. There is  severe left ventricular hypertrophy. Left ventricular diastolic parameters are consistent with Grade I diastolic dysfunction (impaired relaxation). Right Ventricle: The right ventricular size is normal. No increase in right ventricular wall thickness. Right ventricular systolic function is normal. Tricuspid regurgitation signal is inadequate for assessing PA pressure. Left Atrium: Left atrial size was normal in size. Right Atrium: Right atrial size was normal in size. Pericardium: There is no evidence of pericardial effusion. Mitral Valve: The mitral valve is grossly normal. There is mild calcification of the mitral valve leaflet(s). No evidence of mitral valve regurgitation. Tricuspid Valve: The tricuspid valve is grossly normal. Tricuspid valve regurgitation is not demonstrated. Aortic Valve: The aortic valve is calcified. Aortic valve regurgitation is moderate. Aortic regurgitation PHT measures 389 msec. No aortic stenosis is present. Aortic valve mean gradient measures 8.0 mmHg. Aortic valve peak gradient measures 15.5  mmHg. Aortic valve area, by VTI measures 1.39 cm. Pulmonic Valve: The pulmonic valve was grossly normal. Pulmonic valve regurgitation is trivial. Aorta: The aortic root and ascending aorta are structurally normal, with no evidence of dilitation. Venous: The inferior vena cava is normal in size with greater than 50% respiratory variability, suggesting right atrial pressure of 3 mmHg. IAS/Shunts: No atrial level shunt detected by color flow Doppler. Agitated saline contrast was given intravenously to evaluate for intracardiac shunting. Agitated saline contrast bubble study was negative, with no evidence of any interatrial shunt.  LEFT VENTRICLE PLAX 2D LVIDd:         4.40 cm   Diastology LVIDs:         2.80 cm   LV e' medial:    6.31 cm/s LV PW:         2.00 cm   LV E/e' medial:  11.3 LV IVS:        1.80 cm   LV e' lateral:   6.42 cm/s LVOT diam:     1.65 cm   LV E/e' lateral: 11.1 LV SV:         56 LV SV Index:   33 LVOT Area:     2.14 cm                           3D Volume EF:                          3D EF:        52 %                          LV EDV:       188 ml                          LV ESV:       90 ml                          LV SV:        98 ml RIGHT VENTRICLE             IVC RV Basal diam:  3.80 cm     IVC diam: 1.80 cm RV Mid diam:    2.30 cm RV S prime:     15.90 cm/s LEFT ATRIUM  Index        RIGHT ATRIUM           Index LA diam:        4.10 cm 2.43 cm/m   RA Area:     13.60 cm LA Vol (A2C):   49.1 ml 29.06 ml/m  RA Volume:   33.50 ml  19.83 ml/m LA Vol (A4C):   50.2 ml 29.71 ml/m LA Biplane Vol: 51.0 ml 30.19 ml/m  AORTIC VALVE                     PULMONIC VALVE AV Area (Vmax):    1.61 cm      PV Vmax:       1.09 m/s AV Area (Vmean):   1.37 cm      PV Peak grad:  4.8 mmHg AV Area (VTI):     1.39 cm AV Vmax:           197.00 cm/s AV Vmean:          135.000 cm/s AV VTI:            0.401 m AV Peak Grad:      15.5 mmHg AV Mean Grad:      8.0 mmHg LVOT Vmax:         148.00 cm/s LVOT  Vmean:        86.500 cm/s LVOT VTI:          0.261 m LVOT/AV VTI ratio: 0.65 AI PHT:            389 msec  AORTA Ao Root diam: 3.20 cm Ao Asc diam:  3.30 cm MITRAL VALVE MV Area (PHT): 3.63 cm    SHUNTS MV Decel Time: 209 msec    Systemic VTI:  0.26 m MV E velocity: 71.30 cm/s  Systemic Diam: 1.65 cm MV A velocity: 83.90 cm/s MV E/A ratio:  0.85 Mary Scientist, physiological signed by Phineas Inches Signature Date/Time: 11/18/2021/8:59:54 AM    Final    VAS US CAROTID  Result Date: 11/18/2021 Carotid Arterial Duplex Study Patient Name:  Manuel Kramer  Date of Exam:   11/18/2021 Medical Rec #: [id number]         Accession #:    [id number] Date of Birth: 1956-08-08         Patient Gender: M Patient Age:   60 years Exam Location:  [facility] Procedure:      VAS US CAROTID Referring Phys: Alferd Patee [facility] --------------------------------------------------------------------------------  Indications:       CVA. Risk Factors:      Hypertension, hyperlipidemia, Diabetes, current smoker. Limitations        Today's exam was limited due to patient movement. Comparison Study:  No prior studies. Performing Technologist: Darlin Coco RDMS, RVT  Examination Guidelines: A complete evaluation includes B-mode imaging, spectral Doppler, color Doppler, and power Doppler as needed of all accessible portions of each vessel. Bilateral testing is considered an integral part of a complete examination. Limited examinations for reoccurring indications may be performed as noted.  Right Carotid Findings: +----------+--------+--------+--------+------------------+--------+             PSV cm/s EDV cm/s Stenosis Plaque Description Comments  +----------+--------+--------+--------+------------------+--------+  CCA Prox   96       16                                             +----------+--------+--------+--------+------------------+--------+  CCA Distal 79       16                                              +----------+--------+--------+--------+------------------+--------+  ICA Prox   83       16                                             +----------+--------+--------+--------+------------------+--------+  ICA Distal 70       26                                             +----------+--------+--------+--------+------------------+--------+  ECA        84                                                      +----------+--------+--------+--------+------------------+--------+ +----------+--------+-------+----------------+-------------------+             PSV cm/s EDV cms Describe         Arm Pressure (mmHG)  +----------+--------+-------+----------------+-------------------+  Subclavian 89               Multiphasic, WNL                      +----------+--------+-------+----------------+-------------------+ +---------+--------+--+--------+--+---------+  Vertebral PSV cm/s 37 EDV cm/s 13 Antegrade  +---------+--------+--+--------+--+---------+  Left Carotid Findings: +----------+--------+--------+--------+-------------------------------+--------+             PSV cm/s EDV cm/s Stenosis Plaque Description              Comments  +----------+--------+--------+--------+-------------------------------+--------+  CCA Prox   110      19                                                          +----------+--------+--------+--------+-------------------------------+--------+  CCA Distal 69       13                                                          +----------+--------+--------+--------+-------------------------------+--------+  ICA Prox   65       23       1-39%    calcific and heterogenous ,                                                      mild                                      +----------+--------+--------+--------+-------------------------------+--------+  ICA Distal 86       21                                                          +----------+--------+--------+--------+-------------------------------+--------+  ECA         65       10                                                          +----------+--------+--------+--------+-------------------------------+--------+ +----------+--------+--------+----------------+-------------------+             PSV cm/s EDV cm/s Describe         Arm Pressure (mmHG)  +----------+--------+--------+----------------+-------------------+  Subclavian 140               Multiphasic, WNL                      +----------+--------+--------+----------------+-------------------+ +---------+--------+--+--------+--+---------+  Vertebral PSV cm/s 38 EDV cm/s 13 Antegrade  +---------+--------+--+--------+--+---------+   Summary: Right Carotid: The extracranial vessels were near-normal with only minimal wall                thickening or plaque. Left Carotid: Velocities in the left ICA are consistent with a 1-39% stenosis. Vertebrals:  Bilateral vertebral arteries demonstrate antegrade flow. Subclavians: Normal flow hemodynamics were seen in bilateral subclavian              arteries. *See table(s) above for measurements and observations.  Electronically signed by Antony Contras MD on 11/18/2021 at 1:45:56 PM.    Final       Scheduled Meds:  amLODipine  10 mg Oral Daily   aspirin  81 mg Oral Daily   Or   aspirin  300 mg Rectal Daily   atorvastatin  40 mg Oral Daily   chlorhexidine gluconate (MEDLINE KIT)  15 mL Mouth Rinse BID   Chlorhexidine Gluconate Cloth  6 each Topical Daily   cloNIDine  0.2 mg Oral TID   feeding supplement  237 mL Oral BID BM   heparin injection (subcutaneous)  5,000 Units Subcutaneous Q8H   hydrALAZINE  100 mg Oral Q8H   insulin aspart  0-9 Units Subcutaneous TID PC & HS   insulin detemir  5 Units Subcutaneous BID   mouth rinse  15 mL Mouth Rinse q12n4p   multivitamin with minerals  1 tablet Oral Daily   Continuous Infusions:   LOS: 5 days      Debbe Odea, MD [facility] Pager: [email or web address] 11/19/2021, 9:58 AM

## 2021-11-19 NOTE — Progress Notes (Addendum)
STROKE TEAM PROGRESS NOTE   INTERVAL HISTORY Patient RN is at the bedside, no family around. Pt awake alert lying in bed but did not get sleep last night and still delirium with disorientated and perseveration. Moving all extremities. BP on the high end.   Vitals:   11/19/21 0500 11/19/21 0600 11/19/21 0700 11/19/21 0800  BP: (!) 160/88 (!) 162/88 (!) 159/106 (!) 149/80  Pulse: 79 75 77 81  Resp: 17 15 19 18   Temp:      TempSrc:      SpO2: 98% 99% 97% 98%  Weight:      Height:       CBC:  Recent Labs  Lab 11/14/21 0420 11/15/21 0330 11/18/21 1837 11/19/21 0235  WBC 12.3*   < > 12.2* 11.0*  NEUTROABS 10.8*  --   --   --   HGB 13.8   < > 11.0* 10.1*  HCT 41.1   < > 32.7* 30.0*  MCV 90.5   < > 92.9 92.9  PLT 214   < > 296 286   < > = values in this interval not displayed.   Basic Metabolic Panel:  Recent Labs  Lab 11/17/21 0457 11/17/21 1620 11/18/21 0333 11/19/21 0235  NA 141  --  138 138  K 3.7  --  3.5 3.7  CL 110  --  108 109  CO2 23  --  23 24  GLUCOSE 101*  --  202* 190*  BUN 24*  --  29* 29*  CREATININE 1.83*  --  1.81* 1.62*  CALCIUM 8.4*  --  8.1* 8.5*  MG 2.2 2.0  --   --   PHOS 4.0 3.5 3.3  --    Lipid Panel:  Recent Labs  Lab 11/18/21 0333  CHOL 163  TRIG 169*   163*  HDL 41  CHOLHDL 4.0  VLDL 34  LDLCALC 88   HgbA1c:  Recent Labs  Lab 11/14/21 1219  HGBA1C 9.9*   Urine Drug Screen:  Recent Labs  Lab 11/14/21 0712  LABOPIA NONE DETECTED  COCAINSCRNUR NONE DETECTED  LABBENZ POSITIVE*  AMPHETMU NONE DETECTED  THCU NONE DETECTED  LABBARB NONE DETECTED    Alcohol Level  Recent Labs  Lab 11/14/21 0421  ETH <10    IMAGING past 24 hours VAS US CAROTID  Result Date: 11/18/2021 Carotid Arterial Duplex Study Patient Name:  Manuel Kramer  Date of Exam:   11/18/2021 Medical Rec #: [id number]         Accession #:    [id number] Date of Birth: 16-May-1956         Patient Gender: M Patient Age:   [age] years [facility] Procedure:      VAS US CAROTID Referring Phys: Terrilee FilesSALMAN [facility] --------------------------------------------------------------------------------  Indications:       CVA. Risk Factors:      Hypertension, hyperlipidemia, Diabetes, current smoker. Limitations        Today's exam was limited due to patient movement. Comparison Study:  No prior studies. Performing Technologist: Jean Rosenthalachel Hodge RDMS, RVT  Examination Guidelines: A complete evaluation includes B-mode imaging, spectral Doppler, color Doppler, and power Doppler as needed of all accessible portions of each vessel. Bilateral testing is considered an integral part of a complete examination. Limited examinations for reoccurring indications may be performed as noted.  Right Carotid Findings: +----------+--------+--------+--------+------------------+--------+             PSV cm/s EDV cm/s Stenosis Plaque Description Comments  +----------+--------+--------+--------+------------------+--------+  CCA Prox   96       16                                             +----------+--------+--------+--------+------------------+--------+  CCA Distal 79       16                                             +----------+--------+--------+--------+------------------+--------+  ICA Prox   83       16                                             +----------+--------+--------+--------+------------------+--------+  ICA Distal 70       26                                             +----------+--------+--------+--------+------------------+--------+  ECA        84                                                      +----------+--------+--------+--------+------------------+--------+ +----------+--------+-------+----------------+-------------------+             PSV cm/s EDV cms Describe         Arm Pressure (mmHG)  +----------+--------+-------+----------------+-------------------+  Subclavian 89               Multiphasic, WNL                       +----------+--------+-------+----------------+-------------------+ +---------+--------+--+--------+--+---------+  Vertebral PSV cm/s 37 EDV cm/s 13 Antegrade  +---------+--------+--+--------+--+---------+  Left Carotid Findings: +----------+--------+--------+--------+-------------------------------+--------+             PSV cm/s EDV cm/s Stenosis Plaque Description              Comments  +----------+--------+--------+--------+-------------------------------+--------+  CCA Prox   110      19                                                          +----------+--------+--------+--------+-------------------------------+--------+  CCA Distal 69       13                                                          +----------+--------+--------+--------+-------------------------------+--------+  ICA Prox   65       23       1-39%    calcific and heterogenous ,  mild                                      +----------+--------+--------+--------+-------------------------------+--------+  ICA Distal 86       21                                                          +----------+--------+--------+--------+-------------------------------+--------+  ECA        65       10                                                          +----------+--------+--------+--------+-------------------------------+--------+ +----------+--------+--------+----------------+-------------------+             PSV cm/s EDV cm/s Describe         Arm Pressure (mmHG)  +----------+--------+--------+----------------+-------------------+  Subclavian 140               Multiphasic, WNL                      +----------+--------+--------+----------------+-------------------+ +---------+--------+--+--------+--+---------+  Vertebral PSV cm/s 38 EDV cm/s 13 Antegrade  +---------+--------+--+--------+--+---------+   Summary: Right Carotid: The extracranial vessels were near-normal with only minimal wall                 thickening or plaque. Left Carotid: Velocities in the left ICA are consistent with a 1-39% stenosis. Vertebrals:  Bilateral vertebral arteries demonstrate antegrade flow. Subclavians: Normal flow hemodynamics were seen in bilateral subclavian              arteries. *See table(s) above for measurements and observations.  Electronically signed by Delia Heady MD on 11/18/2021 at 1:45:56 PM.    Final     PHYSICAL EXAM  Temp:  [97.8 F (36.6 C)-98.4 F (36.9 C)] 98.4 F (36.9 C) (01/21 0400) Pulse Rate:  [71-87] 81 (01/21 0800) Resp:  [6-19] 18 (01/21 0800) BP: (141-184)/(78-121) 149/80 (01/21 0800) SpO2:  [95 %-100 %] 98 % (01/21 0800)  General - Well nourished, well developed, in no apparent distress.  Ophthalmologic - fundi not visualized due to noncooperation.  Cardiovascular - Regular rhythm and rate.  Neuro - awake, alert, eyes open, orientated to his last name only,  not orientated to first name, place, time or age. No aphasia, able to say "I don't know", able to name 2/4 and able to repeat simple sentences but most language was perseverated on "I could have live with my wife Manuel Kramer but I choose to be here". No gaze palsy, tracking bilaterally, blinking to visual threat bilaterally. Slight right facial droop. Tongue midline. Bilateral UEs 4/5, symmetrical. Bilaterally LEs 3/5, symmetric. Sensation and coordination not cooperative, gait not tested.     ASSESSMENT/PLAN Manuel Kramer is a 66 y.o. male PMHx of T2DM, HTN, current smoker who was  brought in by [facility] after neighbors reported his medical alarm went off and  was found unresponsive for an unknown down time. Neurology service was consulted for evaluation of his unresponsiveness.   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 CTA head & neck shows Atherosclerosis without flow limiting stenosis of major vessels in the head and neck. MRA Head - unremarkable MRI Head Subcentimeter acute infarcts within the right frontal operculum, genu of right internal capsule, right frontoparietal periventricular white matter, and posterior right frontal lobe subcortical white Matter. 6 mm subacute infarct within right frontoparietal periventricular white matter. 2D echo EF 60-65%. Agitated saline contrast bubble study was negative, with no evidence of any interatrial shunt. CUS unremarkable EKG shows sinus Tachycardia. LDL 88 HgbA1c 9.9 VTE prophylaxis - Heparin 5000 units Subq every 8 hours No antithrombotic prior to admission, now on aspirin 81 mg daily. No DAPT due to numerous microhemorrhages on MRI Therapy recommendations: CIR Disposition: pending  MCBs due to uncontrolled HTN MRI brain Fairly numerous supratentorial and infratentorial chronic parenchymal microhemorrhages. 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 Uncontrolled HTN PTA Strict BP management after discharge BP goal normotensive Avoid DAPT   Hypertension Home meds:  Lisinopril 40mg  daily, catapres 0.1mg  bid, Norvasc 10mg  po daily, HCTZ 25mg  daily Stable but on the high end Norvasc 10, clonidine 0.2 tid, hydralazine 100 Q8 Long-term BP goal normotensive  Hyperlipidemia Home meds: Lipitor 10mg  at bedtime resumed in hospital LDL 88, goal <  70 increase Lipitor from 10mg  to 40 mg po q HS Continue statin at discharge  Diabetes type II UnControlled Home meds:  Lantus 30-64units Subq at bedtime and Glyzambi 1 tablet daily HgbA1c 9.9, goal < 7.0 CBGs SSI On levemir Close PCP follow up as outpt for better DM control  Tobacco abuse Current smoker Smoking cessation counseling will be provided  Other Stroke Risk Factors   Other Active Problems AKI Cre 2.06->1.83->1.62 Leukocytosis WBC 14.7->12.9->11.0   Hospital day # 5  Extensive time spent in counseling and coordination of care, reviewing test results, images and medication, and discussing the diagnosis, treatment plan and potential prognosis. This patient's care requiresreview of multiple databases, neurological assessment, discussion with family, other specialists and medical decision making of high complexity.   Marvel PlanJindong Djibril Glogowski, MD PhD Stroke Neurology 11/19/2021 10:35 AM   To contact Stroke Continuity provider, please refer to [email or web address]. After hours, contact General Neurology

## 2021-11-19 NOTE — Progress Notes (Addendum)
Speech Language Pathology Treatment: Dysphagia;Cognitive-Linquistic  Patient Details Name: Manuel Kramer MRN: [id number] DOB: September 04, 1956 Today's Date: 11/19/2021 Time: 0830-0900 SLP Time Calculation (min) (ACUTE ONLY): 30 min  Assessment / Plan / Recommendation Clinical Impression  Pt demonstrates significant improvements in attention and initiation today. Pt is able to self feed with less cueing needed, though he is very impulsive and awareness of safety and impairments is absent. When cueing given to improve awareness, pt is argumentative and perseverative. SLP able to redirect pt with repeated cues. Pt with ongoing linguistic perseveration and needs choice cues for word finding and expression of wants and needs. Pt is edentulous and able to masticate soft solids though bolus formation prolonged.  Pt able to participate in intensive rehabilitation now. Recommend AIR.   HPI HPI: Pt is a 66 year old man admitted after EMS found him unresponsive and hypotensive; Pt was intubated on 11/14/2021-11/17/2021. MRI scan revealed several small infarcts in right hemisphere.      SLP Plan  Continue with current plan of care      Recommendations for follow up therapy are one component of a multi-disciplinary discharge planning process, led by the attending physician.  Recommendations may be updated based on patient status, additional functional criteria and insurance authorization.    Recommendations  Diet recommendations: Thin liquid;Dysphagia 3 (mechanical soft) Liquids provided via: Cup;Straw Medication Administration: Whole meds with liquid Supervision: Patient able to self feed                Oral Care Recommendations: Oral care BID Follow Up Recommendations: Acute inpatient rehab (3hours/day) Assistance recommended at discharge: PRN SLP Visit Diagnosis: Dysphagia, oropharyngeal phase (R13.12) Plan: Continue with current plan of care           Sanna Porcaro, Katherene Ponto  11/19/2021, 9:23 AM

## 2021-11-19 NOTE — Progress Notes (Signed)
eLink Physician-Brief Progress Note Patient Name: Manuel Kramer DOB: Apr 22, 1956 MRN: [id number]   Date of Service  11/19/2021  HPI/Events of Note  Patient with mild delirium and insomnia  eICU Interventions  Zyprexa Zydis 5 mg tab SL x 1 ordered.        Jenilee Franey U Clerance Umland 11/19/2021, 1:22 AM

## 2021-11-19 NOTE — Progress Notes (Signed)
Alerted [facility] MD Riswan of elevated blood pressure.  Was asked by MD to perform a manual bp.  Results of manual bp were consistent with that of the monitor.  This was reported back to MD Riswan. New medication orders were given.  Both of the patients I.V.s have infiltrated.  Was unable to obtain access despite 2 attempts.  Order for IV team consult was placed.  MD Riswan made aware.

## 2021-11-20 DIAGNOSIS — A419 Sepsis, unspecified organism: Secondary | ICD-10-CM

## 2021-11-20 DIAGNOSIS — I6310 Cerebral infarction due to embolism of unspecified precerebral artery: Secondary | ICD-10-CM

## 2021-11-20 DIAGNOSIS — N179 Acute kidney failure, unspecified: Secondary | ICD-10-CM | POA: Diagnosis not present

## 2021-11-20 DIAGNOSIS — R6521 Severe sepsis with septic shock: Secondary | ICD-10-CM

## 2021-11-20 DIAGNOSIS — G9340 Encephalopathy, unspecified: Secondary | ICD-10-CM | POA: Diagnosis not present

## 2021-11-20 LAB — GLUCOSE, CAPILLARY
Glucose-Capillary: 108 mg/dL — ABNORMAL HIGH (ref 70–99)
Glucose-Capillary: 109 mg/dL — ABNORMAL HIGH (ref 70–99)
Glucose-Capillary: 83 mg/dL (ref 70–99)
Glucose-Capillary: 70 mg/dL (ref 70–99)

## 2021-11-20 LAB — BASIC METABOLIC PANEL
Anion gap: 8 (ref 5–15)
BUN: 30 mg/dL — ABNORMAL HIGH (ref 8–23)
CO2: 24 mmol/L (ref 22–32)
Calcium: 8.6 mg/dL — ABNORMAL LOW (ref 8.9–10.3)
Chloride: 107 mmol/L (ref 98–111)
Creatinine, Ser: 1.55 mg/dL — ABNORMAL HIGH (ref 0.61–1.24)
GFR, Estimated: 50 mL/min — ABNORMAL LOW (ref >60)
Glucose, Bld: 157 mg/dL — ABNORMAL HIGH (ref 70–99)
Potassium: 3.7 mmol/L (ref 3.5–5.1)
Sodium: 139 mmol/L (ref 135–145)

## 2021-11-20 MED ORDER — QUETIAPINE FUMARATE 50 MG PO TABS
50.0000 mg | ORAL_TABLET | Freq: Every day | ORAL | Status: DC
  Administered 2021-11-20: 50 mg via ORAL
  Filled 2021-11-20: qty 1

## 2021-11-20 NOTE — Progress Notes (Signed)
STROKE TEAM PROGRESS NOTE   INTERVAL HISTORY No family is at the bedside. Pt still has perseveration on answering questions but seems better than yesterday. However, pt does have visual hallucinations and introduced me to her daughters in room who were not present. Still not orientated fully but no focal deficit.   Vitals:   11/20/21 0005 11/20/21 0331 11/20/21 0400 11/20/21 1135  BP: (!) 145/80 (!) 148/90  138/81  Pulse: 77 73  65  Resp: 18 18  18   Temp: 98.2 F (36.8 C) 98 F (36.7 C)  98.3 F (36.8 C)  TempSrc: Axillary Axillary    SpO2: 96% 100%    Weight:   67.1 kg   Height:       CBC:  Recent Labs  Lab 11/14/21 0420 11/15/21 0330 11/18/21 1837 11/19/21 0235  WBC 12.3*   < > 12.2* 11.0*  NEUTROABS 10.8*  --   --   --   HGB 13.8   < > 11.0* 10.1*  HCT 41.1   < > 32.7* 30.0*  MCV 90.5   < > 92.9 92.9  PLT 214   < > 296 286   < > = values in this interval not displayed.   Basic Metabolic Panel:  Recent Labs  Lab 11/17/21 0457 11/17/21 1620 11/18/21 0333 11/19/21 0235 11/20/21 0310  NA 141  --  138 138 139  K 3.7  --  3.5 3.7 3.7  CL 110  --  108 109 107  CO2 23  --  23 24 24   GLUCOSE 101*  --  202* 190* 157*  BUN 24*  --  29* 29* 30*  CREATININE 1.83*  --  1.81* 1.62* 1.55*  CALCIUM 8.4*  --  8.1* 8.5* 8.6*  MG 2.2 2.0  --   --   --   PHOS 4.0 3.5 3.3  --   --    Lipid Panel:  Recent Labs  Lab 11/18/21 0333  CHOL 163  TRIG 169*   163*  HDL 41  CHOLHDL 4.0  VLDL 34  LDLCALC 88   HgbA1c:  Recent Labs  Lab 11/14/21 1219  HGBA1C 9.9*   Urine Drug Screen:  Recent Labs  Lab 11/14/21 0712  LABOPIA NONE DETECTED  COCAINSCRNUR NONE DETECTED  LABBENZ POSITIVE*  AMPHETMU NONE DETECTED  THCU NONE DETECTED  LABBARB NONE DETECTED    Alcohol Level  Recent Labs  Lab 11/14/21 0421  ETH <10    IMAGING past 24 hours No results found.  PHYSICAL EXAM  Temp:  [97.5 F (36.4 C)-98.6 F (37 C)] 98.3 F (36.8 C) (01/22 1135) Pulse Rate:   [65-87] 65 (01/22 1135) Resp:  [18-21] 18 (01/22 1135) BP: (138-168)/(77-105) 138/81 (01/22 1135) SpO2:  [88 %-100 %] 100 % (01/22 0331) Weight:  [67.1 kg] 67.1 kg (01/22 0400)  General - Well nourished, well developed, in no apparent distress.  Ophthalmologic - fundi not visualized due to noncooperation.  Cardiovascular - Regular rhythm and rate.  Neuro - awake, alert, eyes open, orientated to self and people but not to place, time or age. No aphasia, able to tell me her daughters and wife's names, able to name 2/4 and able to repeat simple sentences but still perseverated on "Manuel Kramer". No gaze palsy, tracking bilaterally, blinking to visual threat bilaterally. Slight right facial droop. Tongue midline. Bilateral UEs 4/5, symmetrical. Bilaterally LEs 3/5, symmetric. Sensation and coordination not cooperative, gait not tested.    ASSESSMENT/PLAN [name] is a 66 y.o. male PMHx of T2DM, HTN, current smoker who was  brought in by [facility] after neighbors reported Manuel Kramer medical alarm went off and  was found unresponsive for an unknown down time. Neurology service was consulted for evaluation of Manuel Kramer unresponsiveness.   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 CTA head & neck shows Atherosclerosis without flow limiting stenosis of major vessels in the head and neck. MRA Head - unremarkable MRI Head Subcentimeter acute infarcts within the right frontal operculum, genu of right internal capsule, right frontoparietal periventricular white matter, and posterior right frontal lobe subcortical white Matter. 6 mm subacute infarct within right frontoparietal periventricular white matter. 2D echo EF 60-65%. Agitated saline contrast bubble study was negative, with no evidence of any interatrial shunt. CUS unremarkable EKG shows sinus Tachycardia. LDL 88 HgbA1c 9.9 VTE prophylaxis - Heparin 5000 units Subq every 8 hours No antithrombotic prior to admission, now on aspirin 81 mg daily. No DAPT due to numerous microhemorrhages on MRI Therapy recommendations: CIR Disposition: pending  MCBs due to uncontrolled HTN MRI brain Fairly numerous supratentorial and infratentorial chronic parenchymal microhemorrhages. 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 Uncontrolled HTN PTA Strict BP management after discharge BP goal normotensive Avoid DAPT   Hypertension Home meds:  Lisinopril 40mg  daily, catapres 0.1mg  bid, Norvasc 10mg  po daily, HCTZ 25mg  daily Stable but on the high end Norvasc 10, clonidine 0.2 tid, hydralazine 100 Q8 Long-term BP goal normotensive  Hyperlipidemia Home meds: Lipitor 10mg  at bedtime resumed in hospital LDL 88, goal < 70 increase Lipitor from 10mg  to 40 mg po q HS Continue statin at discharge  Diabetes type II UnControlled Home meds:  Lantus 30-64units Subq at bedtime and Glyzambi 1 tablet daily HgbA1c 9.9, goal < 7.0 CBGs SSI On levemir Close PCP follow up as outpt for better DM control  Tobacco abuse Current smoker Smoking cessation counseling will be provided  Other Stroke Risk Factors   Other Active Problems AKI Cre  2.06->1.83->1.62->1.55 Leukocytosis WBC 14.7->12.9->11.0   Hospital day # 6  Neurology will sign off. Please call with questions. Pt will follow up with stroke clinic NP at [facility] in about 4 weeks. Thanks for the consult.   , MD PhD Stroke Neurology 11/20/2021 3:06 PM   To contact Stroke Continuity provider, please refer to . After hours, contact General Neurology

## 2021-11-20 NOTE — Progress Notes (Addendum)
PROGRESS NOTE    Manuel Kramer   GZF:[id number]  DOB: 1955/12/22  DOA: 11/14/2021 PCP: Vonna Drafts, FNP   Brief Narrative:  Manuel Kramer 66 year old male who lives alone at home he has a documented history of diabetes and hypertension.  Brought to the hospital by EMS after neighbors called 911.  Agitated and combative when EMS arrived.  The ED:  Left-sided gaze preference.  GCS 3 and emergently intubated  Bolused with Keppra Placed on Cleviprex for hypertension.  Was extubated on 1/19 and transferred to [facility] on 1/[facility].   Subjective: He is confused.  Repeatedly asking when he had his last bath.    Assessment & Plan:   Principal Problem:  Multifocal lacunar infarcts-hypertensive emergency - Suspected to be from small vessel disease - Neurology is recommending aspirin 81 mg daily, Lipitor 40 mg at bedtime -Currently prescribed hydralazine, amlodipine, metoprolol and clonidine -Severe LVH noted on echo -SLP is recommending a D3 diet with whole meds  Active Problems:   Acute encephalopathy -Febrile in the ED-work-up does not suggest an neurological infection work-up has been negative- currently not on antibiotics or antivirals -Remains confused possibly due to acute infarcts - Seroquel given last night but did not sleep much per RN-currently is in a chest Posey and mitts- will order 50 mg of Seroquel for tonight- have also requested a sitter or family to be at bedside  Diabetes mellitus - Hemoglobin A1c is 9.9 - Currently receiving Levemir and sliding scale insulin   Urinary retention Foley placed on 1/21 as at least 3 in and out caths had been done  AKI - Creatinine rose to 2.06 on 11/16/2021 and has been steadily improving - Last creatinine checked in 2021, per care everywhere, was 1.4  Diarrhea Has a rectal pouch  -Per RN, he still had some liquid stool coming out in the tube today- follow  Disposition: Patient lives alone, not sure if he  is a candidate to rehabilitate with this level of confusion.  He ate only about 25% of meals with assistance. Will continue to follow and if not improving will need to transition to comfort care.   DVT prophylaxis: SCDs Code Status: Full code Family Communication: son, Gerald Stabs who is OK with being main point of contact with secondary being daughter Severiano Gilbert Level of Care: Level of care: Med-Surg Disposition Plan:  Status is: Inpatient  Remains inpatient appropriate because: Follow-up on mental status        Consultants:  Neurology  Antimicrobials:  Anti-infectives (From admission, onward)    Start     Dose/Rate Route Frequency Ordered Stop   11/15/21 1000  vancomycin (VANCOREADY) IVPB 750 mg/150 mL  Status:  Discontinued        750 mg 150 mL/hr over 60 Minutes Intravenous Every 24 hours 11/14/21 0556 11/16/21 1045   11/14/21 2200  ceFEPIme (MAXIPIME) 2 g in sodium chloride 0.9 % 100 mL IVPB  Status:  Discontinued        2 g 200 mL/hr over 30 Minutes Intravenous Every 12 hours 11/14/21 0556 11/16/21 1045   11/14/21 1945  acyclovir (ZOVIRAX) 600 mg in dextrose 5 % 100 mL IVPB  Status:  Discontinued        10 mg/kg  60 kg 112 mL/hr over 60 Minutes Intravenous Every 12 hours 11/14/21 1847 11/18/21 0927   11/14/21 0530  ceFEPIme (MAXIPIME) 2 g in sodium chloride 0.9 % 100 mL IVPB        2 g  200 mL/hr over 30 Minutes Intravenous  Once 11/14/21 0523 11/14/21 0633   11/14/21 0530  metroNIDAZOLE (FLAGYL) IVPB 500 mg        500 mg 100 mL/hr over 60 Minutes Intravenous  Once 11/14/21 0523 11/14/21 0713   11/14/21 0530  vancomycin (VANCOCIN) IVPB 1000 mg/200 mL premix        1,000 mg 200 mL/hr over 60 Minutes Intravenous  Once 11/14/21 0523 11/14/21 0811        Objective: Vitals:   11/20/21 0005 11/20/21 0331 11/20/21 0400 11/20/21 1135  BP: (!) 145/80 (!) 148/90  138/81  Pulse: 77 73  65  Resp: _0 Temp: 98.2 F (36.8 C) 98 F (36.7 C)  98.3 F (36.8 C)  TempSrc:  Axillary Axillary    SpO2: 96% 100%    Weight:   67.1 kg   Height:        Intake/Output Summary (Last 24 hours) at 11/20/2021 1347 Last data filed at 11/19/2021 2000 Gross per 24 hour  Intake --  Output 560 ml  Net -560 ml    Filed Weights   11/16/21 0500 11/17/21 0500 11/20/21 0400  Weight: 61 kg 61 kg 67.1 kg    Examination: General exam: Appears comfortable - in chest posey and hand mits HEENT:   oral mucosa moist, no sclera icterus or thrush Respiratory system: Clear to auscultation. Respiratory effort normal. Cardiovascular system: S1 & S2 heard, regular rate and rhythm Gastrointestinal system: Abdomen soft, non-tender, nondistended. Normal bowel sounds   Central nervous system: Alert and oriented to person Extremities: No cyanosis, clubbing or edema Skin: No rashes or ulcers Psychiatry:  Mood & affect appropriate.       Data Reviewed: I have personally reviewed following labs and imaging studies  CBC: Recent Labs  Lab 11/14/21 0420 11/15/21 0330 11/16/21 0721 11/17/21 0457 11/18/21 0333 11/18/21 1837 11/19/21 0235  WBC 12.3*   < > 12.9* 10.5 10.0 12.2* 11.0*  NEUTROABS 10.8*  --   --   --   --   --   --   HGB 13.8   < > 10.3* 11.0* 9.6* 11.0* 10.1*  HCT 41.1   < > 30.8* 32.4* 29.4* 32.7* 30.0*  MCV 90.5   < > 90.6 91.3 96.1 92.9 92.9  PLT 214   < > 205 197 222 296 286   < > = values in this interval not displayed.    Basic Metabolic Panel: Recent Labs  Lab 11/15/21 0632 11/16/21 0721 11/16/21 1646 11/17/21 0457 11/17/21 1620 11/18/21 0333 11/19/21 0235 11/20/21 0310  NA 140 137  --  141  --  138 138 139  K 3.0* 3.0*  --  3.7  --  3.5 3.7 3.7  CL 104 106  --  110  --  108 109 107  CO2 23 21*  --  23  --  _1 GLUCOSE 111* 217*  --  101*  --  202* 190* 157*  BUN 17 24*  --  24*  --  29* 29* 30*  CREATININE 2.05* 2.06*  --  1.83*  --  1.81* 1.62* 1.55*  CALCIUM 8.7* 8.3*  --  8.4*  --  8.1* 8.5* 8.6*  MG 1.8 2.1 2.1 2.2 2.0  --   --   --    PHOS 3.0 4.1 4.2 4.0 3.5 3.3  --   --     GFR: Estimated Creatinine Clearance: 43.4 mL/min (A) (by C-G formula  on SCr of 1.55 mg/dL (H)). °Liver Function Tests: °Recent Labs  °Lab 11/14/21 °0420  °AST 22  °ALT 23  °ALKPHOS 97  °BILITOT 1.0  °PROT 5.9*  °ALBUMIN 3.0*  ° ° °No results for input(s): LIPASE, AMYLASE in the last 168 hours. °Recent Labs  °Lab 11/14/21 °0702 11/16/21 °0721  °AMMONIA 51* 15  ° ° °Coagulation Profile: °Recent Labs  °Lab 11/14/21 °0420  °INR 0.8  ° ° °Cardiac Enzymes: °No results for input(s): CKTOTAL, CKMB, CKMBINDEX, TROPONINI in the last 168 hours. °BNP (last 3 results) °No results for input(s): PROBNP in the last 8760 hours. °HbA1C: °No results for input(s): HGBA1C in the last 72 hours. °CBG: °Recent Labs  °Lab 11/18/21 °2131 11/19/21 °1630 11/19/21 °2235 11/20/21 °0721 11/20/21 °1321  °GLUCAP 188* 264* 147* 108* 109*  ° ° °Lipid Profile: °Recent Labs  °  11/18/21 °0333  °CHOL 163  °HDL 41  °LDLCALC 88  °TRIG 169*   163*  °CHOLHDL 4.0  ° ° °Thyroid Function Tests: °Recent Labs  °  11/18/21 °1837  °TSH 2.064  ° ° °Anemia Panel: °Recent Labs  °  11/18/21 °1837  °VITAMINB12 540  °FOLATE 7.7  ° ° °Urine analysis: °   °Component Value Date/Time  ° COLORURINE YELLOW 11/14/2021 0712  ° APPEARANCEUR CLEAR 11/14/2021 0712  ° LABSPEC 1.010 11/14/2021 0712  ° PHURINE 7.0 11/14/2021 0712  ° GLUCOSEU >=500 (A) 11/14/2021 0712  ° HGBUR LARGE (A) 11/14/2021 0712  ° BILIRUBINUR NEGATIVE 11/14/2021 0712  ° KETONESUR NEGATIVE 11/14/2021 0712  ° PROTEINUR >300 (A) 11/14/2021 0712  ° UROBILINOGEN 0.2 01/01/2015 1118  ° NITRITE NEGATIVE 11/14/2021 0712  ° LEUKOCYTESUR NEGATIVE 11/14/2021 0712  ° °Sepsis Labs: °@LABRCNTIP(procalcitonin:4,lacticidven:4) °) °Recent Results (from the past 240 hour(s))  °Blood Culture (routine x 2)     Status: None  ° Collection Time: 11/14/21  4:09 AM  ° Specimen: BLOOD RIGHT FOREARM  °Result Value Ref Range Status  ° Specimen Description BLOOD RIGHT FOREARM  Final  °  Special Requests   Final  °  BOTTLES DRAWN AEROBIC AND ANAEROBIC Blood Culture adequate volume  ° Culture   Final  °  NO GROWTH 5 DAYS °Performed at [facility], [address] [place], [place] [address] °  ° Report Status 11/19/2021 FINAL  Final  °Resp Panel by RT-PCR (Flu A&B, Covid) Nasopharyngeal Swab     Status: None  ° Collection Time: 11/14/21  4:44 AM  ° Specimen: Nasopharyngeal Swab; Nasopharyngeal(NP) swabs in vial transport medium  °Result Value Ref Range Status  ° SARS Coronavirus 2 by RT PCR NEGATIVE NEGATIVE Final  °  Comment: (NOTE) °SARS-CoV-2 target nucleic acids are NOT DETECTED. ° °The SARS-CoV-2 RNA is generally detectable in upper respiratory °specimens during the acute phase of infection. The lowest °concentration of SARS-CoV-2 viral copies this assay can detect is °138 copies/mL. A negative result does not preclude SARS-Cov-2 °infection and should not be used as the sole basis for treatment or °other patient management decisions. A negative result may occur with  °improper specimen collection/handling, submission of specimen other °than nasopharyngeal swab, presence of viral mutation(s) within the °areas targeted by this assay, and inadequate number of viral °copies(<138 copies/mL). A negative result must be combined with °clinical observations, patient history, and epidemiological °information. The expected result is Negative. ° °Fact Sheet for Patients:  °https://www.fda.gov/media/152166/download ° °Fact Sheet for Healthcare Providers:  °https://www.fda.gov/media/152162/download ° °This test is no t yet approved or cleared by the [place] FDA   FDA and  has been authorized for detection and/or diagnosis of SARS-CoV-2 by FDA under an Emergency Use Authorization (EUA). This EUA will remain  in effect (meaning this test can be used) for the duration of the COVID-19 declaration under Section 564(b)(1) of the Act, 21 U.S.C.section 360bbb-3(b)(1), unless the authorization is terminated   or revoked sooner.       Influenza A by PCR NEGATIVE NEGATIVE Final   Influenza B by PCR NEGATIVE NEGATIVE Final    Comment: (NOTE) The Xpert Xpress SARS-CoV-2/FLU/RSV plus assay is intended as an aid in the diagnosis of influenza from Nasopharyngeal swab specimens and should not be used as a sole basis for treatment. Nasal washings and aspirates are unacceptable for Xpert Xpress SARS-CoV-2/FLU/RSV testing.  Fact Sheet for Patients: EntrepreneurPulse.com.au  Fact Sheet for Healthcare Providers: IncredibleEmployment.be  This test is not yet approved or cleared by the [place] FDA and has been authorized for detection and/or diagnosis of SARS-CoV-2 by FDA under an Emergency Use Authorization (EUA). This EUA will remain in effect (meaning this test can be used) for the duration of the COVID-19 declaration under Section 564(b)(1) of the Act, 21 U.S.C. section 360bbb-3(b)(1), unless the authorization is terminated or revoked.  Performed at [facility], [place] [address], [place], [place] [address]   Blood Culture (routine x 2)     Status: None   Collection Time: 11/14/21  7:28 AM   Specimen: Site Not Specified; Blood  Result Value Ref Range Status   Specimen Description SITE NOT SPECIFIED  Final   Special Requests   Final    BOTTLES DRAWN AEROBIC AND ANAEROBIC Blood Culture adequate volume   Culture   Final    NO GROWTH 5 DAYS Performed at [facility], [place] [address], [place], [place] [address]    Report Status 11/19/2021 FINAL  Final  MRSA Next Gen by PCR, Nasal     Status: None   Collection Time: 11/14/21  1:14 PM   Specimen: Nasal Mucosa; Nasal Swab  Result Value Ref Range Status   MRSA by PCR Next Gen NOT DETECTED NOT DETECTED Final    Comment: (NOTE) The GeneXpert MRSA Assay (FDA approved for NASAL specimens only), is one component of a comprehensive MRSA colonization surveillance program. It is not intended to  diagnose MRSA infection nor to guide or monitor treatment for MRSA infections. Test performance is not FDA approved in patients less than 20 years old. Performed at [facility], [place] [address], [place], [place] [address]   CSF culture w Stat Gram Stain     Status: None   Collection Time: 11/14/21  2:54 PM   Specimen: CSF; Cerebrospinal Fluid  Result Value Ref Range Status   Specimen Description CSF  Final   Special Requests NONE  Final   Gram Stain   Final    WBC PRESENT,BOTH PMN AND MONONUCLEAR NO ORGANISMS SEEN    Culture   Final    NO GROWTH 3 DAYS Performed at [facility], [place] [address] [place], [place] [address]    Report Status 11/18/2021 FINAL  Final  VZV PCR, CSF     Status: None   Collection Time: 11/14/21  5:08 PM   Specimen: CSF; Cerebrospinal Fluid  Result Value Ref Range Status   VZV PCR, CSF Negative Negative Final    Comment: (NOTE) No Varicella Zoster Virus DNA detected. Performed At: [facility] [place], [place] [id number] Rush Farmer MD  BJ:[id number]          Radiology Studies: No results found.    Scheduled Meds:  amLODipine  10 mg Oral Daily   aspirin  81 mg Oral Daily   Or   aspirin  300 mg Rectal Daily   atorvastatin  40 mg Oral Daily   bisacodyl  10 mg Rectal Once   chlorhexidine gluconate (MEDLINE KIT)  15 mL Mouth Rinse BID   Chlorhexidine Gluconate Cloth  6 each Topical Daily   cloNIDine  0.2 mg Oral TID   feeding supplement  237 mL Oral BID BM   heparin injection (subcutaneous)  5,000 Units Subcutaneous Q8H   hydrALAZINE  100 mg Oral Q8H   insulin aspart  0-9 Units Subcutaneous TID PC & HS   insulin detemir  5 Units Subcutaneous BID   mouth rinse  15 mL Mouth Rinse q12n4p   metoprolol tartrate  25 mg Oral BID   multivitamin with minerals  1 tablet Oral Daily   QUEtiapine  25 mg Oral QHS   Continuous Infusions:   LOS: 6 days      Debbe Odea, MD [facility] Pager: [email or web address] 11/20/2021, 1:47 PM

## 2021-11-20 NOTE — Plan of Care (Signed)
Pt is alert oriented x 1, pt has abdominal restraint in place, Rectal pouch in place. Pt has indwelling foley catheter. Pt denies pain.   Problem: Education: Goal: Knowledge of General Education information will improve Description: Including pain rating scale, medication(s)/side effects and non-pharmacologic comfort measures Outcome: Progressing   Problem: Clinical Measurements: Goal: Ability to maintain clinical measurements within normal limits will improve Outcome: Progressing Goal: Will remain free from infection Outcome: Progressing Goal: Diagnostic test results will improve Outcome: Progressing Goal: Respiratory complications will improve Outcome: Progressing Goal: Cardiovascular complication will be avoided Outcome: Progressing   Problem: Nutrition: Goal: Adequate nutrition will be maintained Outcome: Progressing   Problem: Elimination: Goal: Will not experience complications related to bowel motility Outcome: Progressing Goal: Will not experience complications related to urinary retention Outcome: Progressing   Problem: Safety: Goal: Ability to remain free from injury will improve Outcome: Progressing   Problem: Skin Integrity: Goal: Risk for impaired skin integrity will decrease Outcome: Progressing   Problem: Safety: Goal: Non-violent Restraint(s) Outcome: Progressing

## 2021-11-21 DIAGNOSIS — G9340 Encephalopathy, unspecified: Secondary | ICD-10-CM | POA: Diagnosis not present

## 2021-11-21 LAB — BASIC METABOLIC PANEL
Anion gap: 5 (ref 5–15)
BUN: 29 mg/dL — ABNORMAL HIGH (ref 8–23)
CO2: 25 mmol/L (ref 22–32)
Calcium: 8.1 mg/dL — ABNORMAL LOW (ref 8.9–10.3)
Chloride: 111 mmol/L (ref 98–111)
Creatinine, Ser: 1.45 mg/dL — ABNORMAL HIGH (ref 0.61–1.24)
GFR, Estimated: 54 mL/min — ABNORMAL LOW (ref >60)
Glucose, Bld: 126 mg/dL — ABNORMAL HIGH (ref 70–99)
Potassium: 3.6 mmol/L (ref 3.5–5.1)
Sodium: 141 mmol/L (ref 135–145)

## 2021-11-21 LAB — GLUCOSE, CAPILLARY
Glucose-Capillary: 107 mg/dL — ABNORMAL HIGH (ref 70–99)
Glucose-Capillary: 126 mg/dL — ABNORMAL HIGH (ref 70–99)
Glucose-Capillary: 151 mg/dL — ABNORMAL HIGH (ref 70–99)
Glucose-Capillary: 124 mg/dL — ABNORMAL HIGH (ref 70–99)
Glucose-Capillary: 147 mg/dL — ABNORMAL HIGH (ref 70–99)

## 2021-11-21 LAB — CBC
HCT: 24.9 % — ABNORMAL LOW (ref 39.0–52.0)
Hemoglobin: 8.4 g/dL — ABNORMAL LOW (ref 13.0–17.0)
MCH: 32.6 pg (ref 26.0–34.0)
MCHC: 33.7 g/dL (ref 30.0–36.0)
MCV: 96.5 fL (ref 80.0–100.0)
Platelets: 299 10*3/uL (ref 150–400)
RBC: 2.58 MIL/uL — ABNORMAL LOW (ref 4.22–5.81)
RDW: 13.2 % (ref 11.5–15.5)
WBC: 9.2 10*3/uL (ref 4.0–10.5)
nRBC: 0 % (ref 0.0–0.2)

## 2021-11-21 NOTE — Progress Notes (Addendum)
Physical Therapy Treatment Patient Details Name: Manuel Kramer MRN: [id number] DOB: Nov 19, 1955 Today's Date: 11/21/2021   History of Present Illness 66 y/o male presented to ED on 11/14/21 after being found in his home altered and combative. Unresponsive in ED. EEG suggestive of severe encephalopathy. 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 Intubated 1/16-1/19. PMH: HTN, DM    PT Comments    Pt was sleeping upon entry into the room. MD note reported that he did not sleep well last night and was given medication to help him sleep. He was able to sit at EOB with total assist +2. Patient was able to hold onto the rail and bear weight through his feet and hands. He was non verbal during today's session. Currently recommending acute inpatient rehab to address physical that cognitive impairments. Today's session may have been limited by medication making patient more lethargic. Consider changing recommendation if no progress is made. Will continue to follow acutely to maximize functional mobility, independence and safety.    Recommendations for follow up therapy are one component of a multi-disciplinary discharge planning process, led by the attending physician.  Recommendations may be updated based on patient status, additional functional criteria and insurance authorization.  Follow Up Recommendations  Acute inpatient rehab (3hours/day)     Assistance Recommended at Discharge Frequent or constant Supervision/Assistance  Patient can return home with the following Two people to help with walking and/or transfers;Two people to help with bathing/dressing/bathroom;Assistance with feeding;Assistance with cooking/housework;Direct supervision/assist for medications management;Direct supervision/assist for financial management;Assist for transportation;Help  with stairs or ramp for entrance   Equipment Recommendations  Other (comment) (TBD)    Recommendations for Other Services       Precautions / Restrictions Precautions Precautions: Fall Precaution Comments: aphasia Restrictions Weight Bearing Restrictions: No     Mobility  Bed Mobility Overal bed mobility: Needs Assistance Bed Mobility: Rolling, Supine to Sit, Sit to Supine Rolling: +2 for physical assistance, Total assist   Supine to sit: +2 for physical assistance, HOB elevated, Total assist Sit to supine: Total assist, +2 for physical assistance, +2 for safety/equipment   General bed mobility comments: Pt did not participate in transfers and bed mobility/ He did put some weight into his feet and hands with sitting at EOB.    Transfers                  Lateral/Scoot Transfers: Total assist General transfer comment: Did not transfer this session    Ambulation/Gait                   Stairs             Wheelchair Mobility    Modified Rankin (Stroke Patients Only)       Balance Overall balance assessment: Needs assistance Sitting-balance support: Bilateral upper extremity supported, Feet supported Sitting balance-Leahy Scale: Zero Sitting balance - Comments: dependent on external support Postural control: Posterior lean, Left lateral lean                                  Cognition Arousal/Alertness: Suspect due to medications, Lethargic Behavior During Therapy: Restless, Agitated Overall Cognitive Status: Impaired/Different from baseline  Problem Solving: Slow processing, Decreased initiation, Difficulty sequencing, Requires verbal cues, Requires tactile cues General Comments: Patient was non verbal at time of the treatment session, was grutting and moaning throughout the treatment session, and was attempting to to back to sleep. He had not slept well the night before and was given  medication this morning to help him sleep.        Exercises      General Comments        Pertinent Vitals/Pain Pain Assessment Pain Assessment: PAINAD Breathing: normal Negative Vocalization: occasional moan/groan, low speech, negative/disapproving quality Facial Expression: facial grimacing Body Language: relaxed Consolability: no need to console PAINAD Score: 3    Home Living                          Prior Function            PT Goals (current goals can now be found in the care plan section)      Frequency    Min 4X/week      PT Plan      Co-evaluation              AM-PAC PT "6 Clicks" Mobility   Outcome Measure    Help needed moving from lying on your back to sitting on the side of a flat bed without using bedrails?: Total Help needed moving to and from a bed to a chair (including a wheelchair)?: Total Help needed standing up from a chair using your arms (e.g., wheelchair or bedside chair)?: Total Help needed to walk in hospital room?: Total Help needed climbing 3-5 steps with a railing? : Total 6 Click Score: 5    End of Session Equipment Utilized During Treatment: Gait belt Activity Tolerance: Patient limited by fatigue;Patient limited by lethargy Patient left: in bed;with call bell/phone within reach;with nursing/sitter in room;with restraints reapplied;with bed alarm set Nurse Communication: Mobility status PT Visit Diagnosis: Muscle weakness (generalized) (M62.81);Other abnormalities of gait and mobility (R26.89);Other symptoms and signs involving the nervous system (R29.898)     Time: 4580-9983 PT Time Calculation (min) (ACUTE ONLY): 19 min  Charges:  $Therapeutic Activity: 8-22 mins                     Armanda Heritage, SPT    Armanda Heritage 11/21/2021, 4:12 PM

## 2021-11-21 NOTE — Progress Notes (Addendum)
PROGRESS NOTE    Manuel Kramer   ACZ:[id number]  DOB: 05/14/1956  DOA: 11/14/2021 PCP: Vonna Drafts, FNP   Brief Narrative:  Manuel Kramer 66 year old male who lives alone at home he has a documented history of diabetes and hypertension.  Brought to the hospital by EMS after neighbors called 911.  Agitated and combative when EMS arrived.  The ED:  Left-sided gaze preference.  GCS 3 and emergently intubated  Bolused with Keppra Placed on Cleviprex for hypertension.  Was extubated on 1/19 and transferred to [facility].   Subjective: He is  snoring heavily. Per RN, he did not sleep this 2-3 AM even after the Seroquel.     Assessment & Plan:   Principal Problem:  Multifocal lacunar infarcts-hypertensive emergency - Suspected to be from small vessel disease - Neurology is recommending aspirin 81 mg daily, Lipitor 40 mg at bedtime -Currently prescribed hydralazine, amlodipine, metoprolol and clonidine -Severe LVH noted on echo -SLP is recommending a D3 diet with whole meds  Active Problems:   Acute encephalopathy -Febrile in the ED-work-up does not suggest an neurological infection work-up has been negative- currently not on antibiotics or antivirals - 50 mg of Seroquel documented to have been given at 2300 yesterday- he is still asleep- will hold Seroquel for tonight - have also requested a sitter or family to be at bedside- he remains in a chest posey    Diabetes mellitus - Hemoglobin A1c is 9.9 - Currently receiving Levemir and sliding scale insulin  - oral intake is poor/ erratic- will dc Levemir  Urinary retention Foley placed on 1/21 as at least 3 in and out caths had been done  AKI - Creatinine rose to 2.06 on 11/16/2021 and has been steadily improving - Last creatinine checked in 2021, per care everywhere, was 1.4 - back to baseline  Diarrhea Has a rectal pouch  -Per RN, he still had a small amount of liquid stool and as he is  incontinent, the rectal pouch is being continued  Disposition: Patient lives alone, not sure if he is a candidate to rehabilitate with this level of confusion.   Have asked RN to follow today but he has been quite sleepy. Gertie Fey to see if mental status would improve after leaving the ICU but I don't see much improvement. He is a 2 person assist per PT. Will continue to follow and if not improving will need to transition to comfort care.   DVT prophylaxis: SCDs Code Status: Full code Family Communication: son, Gerald Stabs who is OK with being main point of contact with secondary being daughter Severiano Gilbert Level of Care: Level of care: Med-Surg Disposition Plan:  Status is: Inpatient  Remains inpatient appropriate because: Follow-up on mental status , unsafe dc as he lives alone  Consultants:  Neurology  Antimicrobials:  Anti-infectives (From admission, onward)    Start     Dose/Rate Route Frequency Ordered Stop   11/15/21 1000  vancomycin (VANCOREADY) IVPB 750 mg/150 mL  Status:  Discontinued        750 mg 150 mL/hr over 60 Minutes Intravenous Every 24 hours 11/14/21 0556 11/16/21 1045   11/14/21 2200  ceFEPIme (MAXIPIME) 2 g in sodium chloride 0.9 % 100 mL IVPB  Status:  Discontinued        2 g 200 mL/hr over 30 Minutes Intravenous Every 12 hours 11/14/21 0556 11/16/21 1045   11/14/21 1945  acyclovir (ZOVIRAX) 600 mg in dextrose 5 % 100 mL IVPB  Status:  Discontinued        10 mg/kg  60 kg 112 mL/hr over 60 Minutes Intravenous Every 12 hours 11/14/21 1847 11/18/21 0927   11/14/21 0530  ceFEPIme (MAXIPIME) 2 g in sodium chloride 0.9 % 100 mL IVPB        2 g 200 mL/hr over 30 Minutes Intravenous  Once 11/14/21 0523 11/14/21 0633   11/14/21 0530  metroNIDAZOLE (FLAGYL) IVPB 500 mg        500 mg 100 mL/hr over 60 Minutes Intravenous  Once 11/14/21 0523 11/14/21 0713   11/14/21 0530  vancomycin (VANCOCIN) IVPB 1000 mg/200 mL premix        1,000 mg 200 mL/hr over 60 Minutes Intravenous  Once  11/14/21 0523 11/14/21 0811        Objective: Vitals:   11/20/21 2329 11/21/21 0329 11/21/21 0636 11/21/21 0806  BP: (!) 174/93 123/60 (!) 148/75 132/83  Pulse: 70 (!) 58 60 (!) 59  Resp: 19 18  16   Temp: 98.5 F (36.9 C) 98.2 F (36.8 C)  97.6 F (36.4 C)  TempSrc: Axillary   Axillary  SpO2: 96% 100%    Weight:      Height:        Intake/Output Summary (Last 24 hours) at 11/21/2021 1014 Last data filed at 11/21/2021 0713 Gross per 24 hour  Intake 0 ml  Output 500 ml  Net -500 ml    Filed Weights   11/16/21 0500 11/17/21 0500 11/20/21 0400  Weight: 61 kg 61 kg 67.1 kg    Examination: General exam: Appears comfortable - asleep Respiratory system: Clear to auscultation. Respiratory effort normal. Cardiovascular system: S1 & S2 heard, regular rate and rhythm Gastrointestinal system: Abdomen soft, non-tender, nondistended. Normal bowel sounds   Extremities: No cyanosis, clubbing or edema Skin: No rashes or ulcers     Data Reviewed: I have personally reviewed following labs and imaging studies  CBC: Recent Labs  Lab 11/17/21 0457 11/18/21 0333 11/18/21 1837 11/19/21 0235 11/21/21 0339  WBC 10.5 10.0 12.2* 11.0* 9.2  HGB 11.0* 9.6* 11.0* 10.1* 8.4*  HCT 32.4* 29.4* 32.7* 30.0* 24.9*  MCV 91.3 96.1 92.9 92.9 96.5  PLT 197 222 296 286 540    Basic Metabolic Panel: Recent Labs  Lab 11/15/21 0632 11/16/21 0721 11/16/21 1646 11/17/21 0457 11/17/21 1620 11/18/21 0333 11/19/21 0235 11/20/21 0310 11/21/21 0339  NA 140 137  --  141  --  138 138 139 141  K 3.0* 3.0*  --  3.7  --  3.5 3.7 3.7 3.6  CL 104 106  --  110  --  108 109 107 111  CO2 23 21*  --  23  --  23 24 24 25   GLUCOSE 111* 217*  --  101*  --  202* 190* 157* 126*  BUN 17 24*  --  24*  --  29* 29* 30* 29*  CREATININE 2.05* 2.06*  --  1.83*  --  1.81* 1.62* 1.55* 1.45*  CALCIUM 8.7* 8.3*  --  8.4*  --  8.1* 8.5* 8.6* 8.1*  MG 1.8 2.1 2.1 2.2 2.0  --   --   --   --   PHOS 3.0 4.1 4.2 4.0 3.5  3.3  --   --   --     GFR: Estimated Creatinine Clearance: 46.4 mL/min (A) (by C-G formula based on SCr of 1.45 mg/dL (H)). Liver Function Tests: No results for input(s): AST, ALT, ALKPHOS, BILITOT, PROT, ALBUMIN in  the last 168 hours.  No results for input(s): LIPASE, AMYLASE in the last 168 hours. Recent Labs  Lab 11/16/21 0721  AMMONIA 15    Coagulation Profile: No results for input(s): INR, PROTIME in the last 168 hours.  Cardiac Enzymes: No results for input(s): CKTOTAL, CKMB, CKMBINDEX, TROPONINI in the last 168 hours. BNP (last 3 results) No results for input(s): PROBNP in the last 8760 hours. HbA1C: No results for input(s): HGBA1C in the last 72 hours. CBG: Recent Labs  Lab 11/20/21 1321 11/20/21 1645 11/20/21 2323 11/21/21 0128 11/21/21 0623  GLUCAP 109* 83 70 107* 126*    Lipid Profile: No results for input(s): CHOL, HDL, LDLCALC, TRIG, CHOLHDL, LDLDIRECT in the last 72 hours.  Thyroid Function Tests: Recent Labs    11/18/21 1837  TSH 2.064    Anemia Panel: Recent Labs    11/18/21 1837  VITAMINB12 540  FOLATE 7.7    Urine analysis:    Component Value Date/Time   COLORURINE YELLOW 11/14/2021 [place] 11/14/2021 0712   LABSPEC 1.010 11/14/2021 0712   PHURINE 7.0 11/14/2021 0712   GLUCOSEU >=500 (A) 11/14/2021 0712   HGBUR LARGE (A) 11/14/2021 0712   BILIRUBINUR NEGATIVE 11/14/2021 0712   KETONESUR NEGATIVE 11/14/2021 0712   PROTEINUR >300 (A) 11/14/2021 0712   UROBILINOGEN 0.2 01/01/2015 1118   NITRITE NEGATIVE 11/14/2021 0712   LEUKOCYTESUR NEGATIVE 11/14/2021 0712   Sepsis Labs: @LABRCNTIP (procalcitonin:4,lacticidven:4) ) Recent Results (from the past 240 hour(s))  Blood Culture (routine x 2)     Status: None   Collection Time: 11/14/21  4:09 AM   Specimen: BLOOD RIGHT FOREARM  Result Value Ref Range Status   Specimen Description BLOOD RIGHT FOREARM  Final   Special Requests   Final    BOTTLES DRAWN AEROBIC AND  ANAEROBIC Blood Culture adequate volume   Culture   Final    NO GROWTH 5 DAYS Performed at [facility], [place] [address] [place], [place] [address]    Report Status 11/19/2021 FINAL  Final  Resp Panel by RT-PCR (Flu A&B, Covid) Nasopharyngeal Swab     Status: None   Collection Time: 11/14/21  4:44 AM   Specimen: Nasopharyngeal Swab; Nasopharyngeal(NP) swabs in vial transport medium  Result Value Ref Range Status   SARS Coronavirus 2 by RT PCR NEGATIVE NEGATIVE Final    Comment: (NOTE) SARS-CoV-2 target nucleic acids are NOT DETECTED.  The SARS-CoV-2 RNA is generally detectable in upper respiratory specimens during the acute phase of infection. The lowest concentration of SARS-CoV-2 viral copies this assay can detect is 138 copies/mL. A negative result does not preclude SARS-Cov-2 infection and should not be used as the sole basis for treatment or other patient management decisions. A negative result may occur with  improper specimen collection/handling, submission of specimen other than nasopharyngeal swab, presence of viral mutation(s) within the areas targeted by this assay, and inadequate number of viral copies(<138 copies/mL). A negative result must be combined with clinical observations, patient history, and epidemiological information. The expected result is Negative.  Fact Sheet for Patients:  EntrepreneurPulse.com.au  Fact Sheet for Healthcare Providers:  IncredibleEmployment.be  This test is no t yet approved or cleared by the [place] FDA and  has been authorized for detection and/or diagnosis of SARS-CoV-2 by FDA under an Emergency Use Authorization (EUA). This EUA will remain  in effect (meaning this test can be used) for the duration of the COVID-19 declaration under Section 564(b)(1) of the Act, 21  U.S.C.section 360bbb-3(b)(1), unless the authorization is terminated  or revoked sooner.       Influenza A by PCR  NEGATIVE NEGATIVE Final   Influenza B by PCR NEGATIVE NEGATIVE Final    Comment: (NOTE) The Xpert Xpress SARS-CoV-2/FLU/RSV plus assay is intended as an aid in the diagnosis of influenza from Nasopharyngeal swab specimens and should not be used as a sole basis for treatment. Nasal washings and aspirates are unacceptable for Xpert Xpress SARS-CoV-2/FLU/RSV testing.  Fact Sheet for Patients: EntrepreneurPulse.com.au  Fact Sheet for Healthcare Providers: IncredibleEmployment.be  This test is not yet approved or cleared by the [place] FDA and has been authorized for detection and/or diagnosis of SARS-CoV-2 by FDA under an Emergency Use Authorization (EUA). This EUA will remain in effect (meaning this test can be used) for the duration of the COVID-19 declaration under Section 564(b)(1) of the Act, 21 U.S.C. section 360bbb-3(b)(1), unless the authorization is terminated or revoked.  Performed at [facility], [place] [address] [place], [place] [address]   Blood Culture (routine x 2)     Status: None   Collection Time: 11/14/21  7:28 AM   Specimen: Site Not Specified; Blood  Result Value Ref Range Status   Specimen Description SITE NOT SPECIFIED  Final   Special Requests   Final    BOTTLES DRAWN AEROBIC AND ANAEROBIC Blood Culture adequate volume   Culture   Final    NO GROWTH 5 DAYS Performed at [facility], [place] [address], [place], [place] [address]    Report Status 11/19/2021 FINAL  Final  MRSA Next Gen by PCR, Nasal     Status: None   Collection Time: 11/14/21  1:14 PM   Specimen: Nasal Mucosa; Nasal Swab  Result Value Ref Range Status   MRSA by PCR Next Gen NOT DETECTED NOT DETECTED Final    Comment: (NOTE) The GeneXpert MRSA Assay (FDA approved for NASAL specimens only), is one component of a comprehensive MRSA colonization surveillance program. It is not intended to diagnose MRSA infection nor to guide or monitor  treatment for MRSA infections. Test performance is not FDA approved in patients less than 66 years old. Performed at [facility], [place] [address] [place], [place] [address]   CSF culture w Stat Gram Stain     Status: None   Collection Time: 11/14/21  2:54 PM   Specimen: CSF; Cerebrospinal Fluid  Result Value Ref Range Status   Specimen Description CSF  Final   Special Requests NONE  Final   Gram Stain   Final    WBC PRESENT,BOTH PMN AND MONONUCLEAR NO ORGANISMS SEEN    Culture   Final    NO GROWTH 3 DAYS Performed at [facility], [place] [address], [place],  [address]    Report Status 11/18/2021 FINAL  Final  VZV PCR, CSF     Status: None   Collection Time: 11/14/21  5:08 PM   Specimen: CSF; Cerebrospinal Fluid  Result Value Ref Range Status   VZV PCR, CSF Negative Negative Final    Comment: (NOTE) No Varicella Zoster Virus DNA detected. Performed At: [facility] [place], [place] [id number] Rush Farmer MD [id number]          Radiology Studies: No results found.    Scheduled Meds:  amLODipine  10 mg Oral Daily   aspirin  81 mg Oral Daily   Or   aspirin  300 mg Rectal Daily  atorvastatin  40 mg Oral Daily   bisacodyl  10 mg Rectal Once   chlorhexidine gluconate (MEDLINE KIT)  15 mL Mouth Rinse BID   Chlorhexidine Gluconate Cloth  6 each Topical Daily   cloNIDine  0.2 mg Oral TID   feeding supplement  237 mL Oral BID BM   heparin injection (subcutaneous)  5,000 Units Subcutaneous Q8H   hydrALAZINE  100 mg Oral Q8H   insulin aspart  0-9 Units Subcutaneous TID PC & HS   insulin detemir  5 Units Subcutaneous BID   mouth rinse  15 mL Mouth Rinse q12n4p   metoprolol tartrate  25 mg Oral BID   multivitamin with minerals  1 tablet Oral Daily   QUEtiapine  50 mg Oral QHS   Continuous Infusions:   LOS: 7 days      Debbe Odea, MD [facility] Pager: [email or web address] 11/21/2021, 10:14 AM

## 2021-11-22 LAB — GLUCOSE, CAPILLARY
Glucose-Capillary: 158 mg/dL — ABNORMAL HIGH (ref 70–99)
Glucose-Capillary: 241 mg/dL — ABNORMAL HIGH (ref 70–99)
Glucose-Capillary: 314 mg/dL — ABNORMAL HIGH (ref 70–99)
Glucose-Capillary: 269 mg/dL — ABNORMAL HIGH (ref 70–99)
Glucose-Capillary: 257 mg/dL — ABNORMAL HIGH (ref 70–99)

## 2021-11-22 MED ORDER — METOPROLOL TARTRATE 50 MG PO TABS
50.0000 mg | ORAL_TABLET | Freq: Two times a day (BID) | ORAL | Status: DC
  Administered 2021-11-22 – 2021-11-25 (×7): 50 mg via ORAL
  Filled 2021-11-22 (×7): qty 1

## 2021-11-22 MED ORDER — INSULIN ASPART 100 UNIT/ML IJ SOLN
0.0000 [IU] | Freq: Three times a day (TID) | INTRAMUSCULAR | Status: DC
  Administered 2021-11-22: 12:00:00 3 [IU] via SUBCUTANEOUS
  Administered 2021-11-22 – 2021-11-23 (×3): 5 [IU] via SUBCUTANEOUS
  Administered 2021-11-23: 08:00:00 3 [IU] via SUBCUTANEOUS
  Administered 2021-11-24 (×2): 2 [IU] via SUBCUTANEOUS
  Administered 2021-11-24: 3 [IU] via SUBCUTANEOUS
  Administered 2021-11-25: 7 [IU] via SUBCUTANEOUS
  Administered 2021-11-25: 5 [IU] via SUBCUTANEOUS
  Administered 2021-11-26 (×3): 3 [IU] via SUBCUTANEOUS
  Administered 2021-11-27: 5 [IU] via SUBCUTANEOUS
  Administered 2021-11-27 (×2): 3 [IU] via SUBCUTANEOUS
  Administered 2021-11-28: 2 [IU] via SUBCUTANEOUS
  Administered 2021-11-28: 5 [IU] via SUBCUTANEOUS
  Administered 2021-11-28 – 2021-11-29 (×2): 2 [IU] via SUBCUTANEOUS
  Administered 2021-11-29 (×2): 1 [IU] via SUBCUTANEOUS
  Administered 2021-11-30 – 2021-12-01 (×4): 5 [IU] via SUBCUTANEOUS
  Administered 2021-12-01: 3 [IU] via SUBCUTANEOUS
  Administered 2021-12-01: 2 [IU] via SUBCUTANEOUS
  Administered 2021-12-02: 3 [IU] via SUBCUTANEOUS
  Administered 2021-12-02: 1 [IU] via SUBCUTANEOUS
  Administered 2021-12-02: 2 [IU] via SUBCUTANEOUS
  Administered 2021-12-03: 3 [IU] via SUBCUTANEOUS
  Administered 2021-12-03 (×2): 1 [IU] via SUBCUTANEOUS
  Administered 2021-12-04: 2 [IU] via SUBCUTANEOUS
  Administered 2021-12-04 – 2021-12-05 (×2): 1 [IU] via SUBCUTANEOUS
  Administered 2021-12-05: 7 [IU] via SUBCUTANEOUS
  Administered 2021-12-05: 2 [IU] via SUBCUTANEOUS

## 2021-11-22 MED ORDER — CLONIDINE HCL 0.1 MG PO TABS
0.2000 mg | ORAL_TABLET | Freq: Three times a day (TID) | ORAL | Status: AC
  Administered 2021-11-22 – 2021-11-23 (×3): 0.2 mg via ORAL
  Filled 2021-11-22 (×3): qty 2

## 2021-11-22 MED ORDER — CLONIDINE HCL 0.2 MG/24HR TD PTWK
0.2000 mg | MEDICATED_PATCH | TRANSDERMAL | Status: AC
  Administered 2021-11-22: 18:00:00 0.2 mg via TRANSDERMAL
  Filled 2021-11-22: qty 1

## 2021-11-22 MED ORDER — CLONIDINE HCL 0.1 MG PO TABS
0.1000 mg | ORAL_TABLET | Freq: Three times a day (TID) | ORAL | Status: AC
  Administered 2021-11-24 – 2021-11-25 (×3): 0.1 mg via ORAL
  Filled 2021-11-22 (×3): qty 1

## 2021-11-22 MED ORDER — HYDRALAZINE HCL 50 MG PO TABS
50.0000 mg | ORAL_TABLET | Freq: Three times a day (TID) | ORAL | Status: DC
  Administered 2021-11-22 – 2021-11-24 (×6): 50 mg via ORAL
  Filled 2021-11-22 (×6): qty 1

## 2021-11-22 NOTE — Progress Notes (Addendum)
Occupational Therapy Treatment Patient Details Name: Manuel Kramer MRN: [id number] DOB: 10-Jul-1956 Today's Date: 11/22/2021   History of present illness 66 y/o male presented to ED on 11/14/21 after being found in his home altered and combative. Unresponsive in ED. EEG suggestive of severe encephalopathy. 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 Intubated 1/16-1/19. PMH: HTN, DM   OT comments  Manuel Kramer is making great progress towards his acute goals. He followed all simple commands this session with increased time, but demonstrated difficulty with sequencing multi-step tasks and multi-tasking. Pt had poor problem solving with self-feeding and benefited from cues for safety. Pt oriented to all but date, and had recent news from his sister that his parents have passed (he was confused as to why they have not been to the hospital to visit/call). Overall he was able to get to the EOB with min A, with reports of dizziness once sitting. No nystagmus noted, VSS. Pt was able to complete bimanual grooming tasks in sitting without LOB, no physical assist needed. Declined OOB due to dizziness. Pt will benefit from OT acutely. D/c recommendation remains appropriate.    Recommendations for follow up therapy are one component of a multi-disciplinary discharge planning process, led by the attending physician.  Recommendations may be updated based on patient status, additional functional criteria and insurance authorization.    Follow Up Recommendations  Acute inpatient rehab (3hours/day)    Assistance Recommended at Discharge Frequent or constant Supervision/Assistance  Patient can return home with the following  Two people to help with walking and/or transfers;Two people to help with bathing/dressing/bathroom;Direct supervision/assist for medications management;Direct  supervision/assist for financial management;Assistance with feeding;Assistance with cooking/housework;Assist for transportation;Help with stairs or ramp for entrance   Equipment Recommendations  Other (comment)    Recommendations for Other Services Rehab consult;PT consult;Speech consult    Precautions / Restrictions Precautions Precautions: Fall Restrictions Weight Bearing Restrictions: No       Mobility Bed Mobility Overal bed mobility: Needs Assistance Bed Mobility: Supine to Sit, Sit to Supine     Supine to sit: Min assist Sit to supine: Supervision   General bed mobility comments: min A for management of catheters and to adjust hips towards EOB    Transfers Overall transfer level: Needs assistance                 General transfer comment: pt declined attempt to get OOB due to dizziness     Balance Overall balance assessment: Needs assistance Sitting-balance support: Feet supported Sitting balance-Leahy Scale: Fair Sitting balance - Comments: pt able to complete UE bimal task in sitting without LOB           ADL either performed or assessed with clinical judgement   ADL Overall ADL's : Needs assistance/impaired Eating/Feeding: Set up Eating/Feeding Details (indicate cue type and reason): dys 3 - cues to use utencils & locate on tray. significantly increased time for chewing. cues to use both hands Grooming: Supervision/safety;Sitting Grooming Details (indicate cue type and reason): cues only while sitting EOB, pt talkign a lot and has difficulty with multi-tasking         Functional mobility during ADLs: Minimal assistance (bed level only) General ADL Comments: limited by imapired cognition, BUE proprioception and dizziness - pt with great progress towards his goals this session    Extremity/Trunk Assessment Upper Extremity Assessment RUE Deficits /  Details: generalized weakness, ROM WFL. finger to thumb is slow and required increased cues. RUE  Coordination: decreased fine motor LUE Deficits / Details: generalized weakness. [place] with one handed self feeding while LUE was under tray, once therapist uncovered LUE and cued him to use both hands he used LUE funcitonally. LUE Sensation: decreased proprioception LUE Coordination: WNL   Lower Extremity Assessment Lower Extremity Assessment: Defer to PT evaluation        Vision   Vision Assessment?: Vision impaired- to be further tested in functional context Additional Comments: pt reading all signs and the clock accurately from his bed. Vision assessment screen seems WFL. pt does reports dizziness throughout, no nystagmus noted, denies DV   Perception Perception Perception: Not tested   Praxis Praxis Praxis: Not tested    Cognition Arousal/Alertness: Awake/alert Behavior During Therapy: WFL for tasks assessed/performed Overall Cognitive Status: Impaired/Different from baseline Area of Impairment: Attention, Memory, Following commands, Safety/judgement, Awareness, Problem solving                   Current Attention Level: Sustained, Selective Memory: Decreased short-term memory Following Commands: Follows one step commands consistently, Follows multi-step commands with increased time Safety/Judgement: Decreased awareness of safety Awareness: Emergent Problem Solving: Slow processing, Decreased initiation, Difficulty sequencing, Requires verbal cues, Requires tactile cues General Comments: Upon arrival pt eating lunch, when asked how the food was he said "no this is not my lunch." Pt following commands with increased cues, unable to multi-task (talking while moving.) Perseverating on dizziness this session. 1996 as the year. Stated that he just talked to his sister who reminded him that his parents are dead. He was able to recall his childrens names but not ages, instead stating "80 somthing" and "56 something."              General Comments VSS on RA     Pertinent Vitals/ Pain       Pain Assessment Pain Assessment: No/denies pain Pain Intervention(s): Limited activity within patient's tolerance, Monitored during session         Frequency  Min 2X/week        Progress Toward Goals  OT Goals(current goals can now be found in the care plan section)  Progress towards OT goals: Progressing toward goals  Acute Rehab OT Goals Patient Stated Goal: go home OT Goal Formulation: With patient Time For Goal Achievement: 12/02/21 Potential to Achieve Goals: Good ADL Goals Pt Will Perform Eating: with min guard assist;sitting;with adaptive utensils Pt Will Perform Grooming: with min guard assist;sitting Pt Will Transfer to Toilet: with mod assist;stand pivot transfer;bedside commode Pt Will Perform Toileting - Clothing Manipulation and hygiene: with mod assist;sit to/from stand Additional ADL Goal #1: Pt will sit EOB at min guard assist level for approx 5 min to participate in ADL.  Plan Discharge plan remains appropriate       AM-PAC OT "6 Clicks" Daily Activity     Outcome Measure   Help from another person eating meals?: A Little Help from another person taking care of personal grooming?: A Little Help from another person toileting, which includes using toliet, bedpan, or urinal?: Total Help from another person bathing (including washing, rinsing, drying)?: A Lot Help from another person to put on and taking off regular upper body clothing?: A Little Help from another person to put on and taking off regular lower body clothing?: A Lot 6 Click Score: 14    End of Session    OT Visit Diagnosis: Other  symptoms and signs involving the nervous system (R29.898);Other symptoms and signs involving cognitive function;Cognitive communication deficit (R41.841) Symptoms and signs involving cognitive functions: Cerebral infarction   Activity Tolerance Patient tolerated treatment well   Patient Left in bed;with call bell/phone within  reach;with bed alarm set   Nurse Communication Mobility status        Time: [id number] OT Time Calculation (min): 26 min  Charges: OT General Charges $OT Visit: 1 Visit OT Treatments $Self Care/Home Management : 23-37 mins   Elyon Zoll A Leiah Giannotti 11/22/2021, 2:42 PM

## 2021-11-22 NOTE — Progress Notes (Addendum)
PROGRESS NOTE    Manuel Kramer   MBW:[id number]  DOB: 1956-04-13  DOA: 11/14/2021 PCP: Vonna Drafts, FNP   Brief Narrative:  Manuel Kramer 66 year old male who lives alone at home he has a documented history of diabetes and hypertension.  Brought to the hospital by EMS after neighbors called 911.  Agitated and combative when EMS arrived.  The ED:  Left-sided gaze preference.  GCS 3 and emergently intubated  Bolused with Keppra Placed on Nitro infusion and later Cleviprex for hypertension.  Was extubated on 1/19 and transferred to [facility] on 1/[facility].   Subjective: He is alert and oriented today. He has no complaints. Tells me he was not taking any meds at home. Agrees that he lives alone.     Assessment & Plan:   Principal Problem:  Multifocal lacunar infarcts-hypertensive emergency - Suspected to be from small vessel disease - Neurology is recommending aspirin 81 mg daily, Lipitor 40 mg at bedtime -Currently receiving hydralazine, amlodipine, metoprolol and clonidine - to improve compliance> I will ask pharmacy to change the oral Clonidine to a patch- will increase the Metoprolol today from 25 to 50 and wean the Hydralazine from 100 to 50 - he states he wasn't taking his meds, prior records in Epic reveal that he has admitted to this before -Severe LVH noted on echo consistent with poor BP control -SLP is recommending a D3 diet with whole meds - awaiting repeat PT re-eval today- he was a 2 person assist on 2 prior PT evaluations  Active Problems:   Acute metabolic encephalopathy- resolved -Febrile in the ED-work-up does not suggest an neurological infection work-up has been negative- currently not on antibiotics or antivirals - He is surprisingly alert and oriented today and feeding himself  Diabetes mellitus, uncontrolled with hyperglycemia - Hemoglobin A1c is 9.9 - He would benefit from oral meds on dc and diet education but may not comply- have  requested a nutrition consult - cont SSI while here- sugars are relatively stable- follow  Urinary retention Foley placed on 1/21 as 3 in and out caths had been done - will dc foley today and will need to follow to see if he voids  AKI- CKD 3 b - Creatinine rose to 2.06 on 11/16/2021 and has been steadily improving - Last creatinine checked in 2021, per care everywhere, was 1.4 - back to baseline - dc foley today and follow  Diarrhea Has a rectal pouch but diarrhea seems to have resolved- have asked RN to dc it.         DVT prophylaxis: SCDs and Heparin Code Status: Full code Family Communication: called son, Gerald Stabs and daughter Severiano Gilbert today- no answer Level of Care: Level of care: Med-Surg Disposition Plan:  Status is: Inpatient  Remains inpatient appropriate because: Follow-up on PT eval, voiding trial, sugars and BP  Consultants:  Neurology  Antimicrobials:  Anti-infectives (From admission, onward)    Start     Dose/Rate Route Frequency Ordered Stop   11/15/21 1000  vancomycin (VANCOREADY) IVPB 750 mg/150 mL  Status:  Discontinued        750 mg 150 mL/hr over 60 Minutes Intravenous Every 24 hours 11/14/21 0556 11/16/21 1045   11/14/21 2200  ceFEPIme (MAXIPIME) 2 g in sodium chloride 0.9 % 100 mL IVPB  Status:  Discontinued        2 g 200 mL/hr over 30 Minutes Intravenous Every 12 hours 11/14/21 0556 11/16/21 1045   11/14/21 1945  acyclovir (ZOVIRAX) 600  mg in dextrose 5 % 100 mL IVPB  Status:  Discontinued        10 mg/kg  60 kg 112 mL/hr over 60 Minutes Intravenous Every 12 hours 11/14/21 1847 11/18/21 0927   11/14/21 0530  ceFEPIme (MAXIPIME) 2 g in sodium chloride 0.9 % 100 mL IVPB        2 g 200 mL/hr over 30 Minutes Intravenous  Once 11/14/21 0523 11/14/21 0633   11/14/21 0530  metroNIDAZOLE (FLAGYL) IVPB 500 mg        500 mg 100 mL/hr over 60 Minutes Intravenous  Once 11/14/21 0523 11/14/21 0713   11/14/21 0530  vancomycin (VANCOCIN) IVPB 1000 mg/200 mL  premix        1,000 mg 200 mL/hr over 60 Minutes Intravenous  Once 11/14/21 0523 11/14/21 0811        Objective: Vitals:   11/21/21 2327 11/22/21 0330 11/22/21 0747 11/22/21 1107  BP: (!) 142/76 (!) 157/86 (!) 159/87 (!) 171/84  Pulse: 68 65 64 73  Resp: 17 14 16 18   Temp: (!) 97.4 F (36.3 C) 98.5 F (36.9 C) 98.3 F (36.8 C) 98.5 F (36.9 C)  TempSrc: Axillary Oral Oral Oral  SpO2: 95% 96% 100% 100%  Weight:      Height:        Intake/Output Summary (Last 24 hours) at 11/22/2021 1123 Last data filed at 11/22/2021 0943 Gross per 24 hour  Intake 570 ml  Output 475 ml  Net 95 ml    Filed Weights   11/16/21 0500 11/17/21 0500 11/20/21 0400  Weight: 61 kg 61 kg 67.1 kg    Examination: General exam: Appears comfortable  HEENT:  oral mucosa moist, no sclera icterus or thrush Respiratory system: Clear to auscultation. Respiratory effort normal. Cardiovascular system: S1 & S2 heard, regular rate and rhythm Gastrointestinal system: Abdomen soft, non-tender, nondistended. Normal bowel sounds   Central nervous system: Alert and oriented.following commands, moving all extremities independently.  Extremities: No cyanosis, clubbing or edema Skin: No rashes or ulcers Psychiatry:  Mood & affect appropriate.       Data Reviewed: I have personally reviewed following labs and imaging studies  CBC: Recent Labs  Lab 11/17/21 0457 11/18/21 0333 11/18/21 1837 11/19/21 0235 11/21/21 0339  WBC 10.5 10.0 12.2* 11.0* 9.2  HGB 11.0* 9.6* 11.0* 10.1* 8.4*  HCT 32.4* 29.4* 32.7* 30.0* 24.9*  MCV 91.3 96.1 92.9 92.9 96.5  PLT 197 222 296 286 333    Basic Metabolic Panel: Recent Labs  Lab 11/16/21 0721 11/16/21 1646 11/17/21 0457 11/17/21 1620 11/18/21 0333 11/19/21 0235 11/20/21 0310 11/21/21 0339  NA 137  --  141  --  138 138 139 141  K 3.0*  --  3.7  --  3.5 3.7 3.7 3.6  CL 106  --  110  --  108 109 107 111  CO2 21*  --  23  --  23 24 24 25   GLUCOSE 217*  --   101*  --  202* 190* 157* 126*  BUN 24*  --  24*  --  29* 29* 30* 29*  CREATININE 2.06*  --  1.83*  --  1.81* 1.62* 1.55* 1.45*  CALCIUM 8.3*  --  8.4*  --  8.1* 8.5* 8.6* 8.1*  MG 2.1 2.1 2.2 2.0  --   --   --   --   PHOS 4.1 4.2 4.0 3.5 3.3  --   --   --     GFR:  Estimated Creatinine Clearance: 46.4 mL/min (A) (by C-G formula based on SCr of 1.45 mg/dL (H)). Liver Function Tests: No results for input(s): AST, ALT, ALKPHOS, BILITOT, PROT, ALBUMIN in the last 168 hours.  No results for input(s): LIPASE, AMYLASE in the last 168 hours. Recent Labs  Lab 11/16/21 0721  AMMONIA 15    Coagulation Profile: No results for input(s): INR, PROTIME in the last 168 hours.  Cardiac Enzymes: No results for input(s): CKTOTAL, CKMB, CKMBINDEX, TROPONINI in the last 168 hours. BNP (last 3 results) No results for input(s): PROBNP in the last 8760 hours. HbA1C: No results for input(s): HGBA1C in the last 72 hours. CBG: Recent Labs  Lab 11/21/21 0623 11/21/21 1113 11/21/21 1616 11/21/21 2126 11/22/21 0619  GLUCAP 126* 151* 124* 147* 158*    Lipid Profile: No results for input(s): CHOL, HDL, LDLCALC, TRIG, CHOLHDL, LDLDIRECT in the last 72 hours.  Thyroid Function Tests: No results for input(s): TSH, T4TOTAL, FREET4, T3FREE, THYROIDAB in the last 72 hours.  Anemia Panel: No results for input(s): VITAMINB12, FOLATE, FERRITIN, TIBC, IRON, RETICCTPCT in the last 72 hours.  Urine analysis:    Component Value Date/Time   COLORURINE YELLOW 11/14/2021 [place] 11/14/2021 0712   LABSPEC 1.010 11/14/2021 0712   PHURINE 7.0 11/14/2021 0712   GLUCOSEU >=500 (A) 11/14/2021 0712   HGBUR LARGE (A) 11/14/2021 0712   BILIRUBINUR NEGATIVE 11/14/2021 0712   KETONESUR NEGATIVE 11/14/2021 0712   PROTEINUR >300 (A) 11/14/2021 0712   UROBILINOGEN 0.2 01/01/2015 1118   NITRITE NEGATIVE 11/14/2021 0712   LEUKOCYTESUR NEGATIVE 11/14/2021 0712   Sepsis  Labs: @LABRCNTIP (procalcitonin:4,lacticidven:4) ) Recent Results (from the past 240 hour(s))  Blood Culture (routine x 2)     Status: None   Collection Time: 11/14/21  4:09 AM   Specimen: BLOOD RIGHT FOREARM  Result Value Ref Range Status   Specimen Description BLOOD RIGHT FOREARM  Final   Special Requests   Final    BOTTLES DRAWN AEROBIC AND ANAEROBIC Blood Culture adequate volume   Culture   Final    NO GROWTH 5 DAYS Performed at [facility], [address], [place], [place] [address]    Report Status 11/19/2021 FINAL  Final  Resp Panel by RT-PCR (Flu A&B, Covid) Nasopharyngeal Swab     Status: None   Collection Time: 11/14/21  4:44 AM   Specimen: Nasopharyngeal Swab; Nasopharyngeal(NP) swabs in vial transport medium  Result Value Ref Range Status   SARS Coronavirus 2 by RT PCR NEGATIVE NEGATIVE Final    Comment: (NOTE) SARS-CoV-2 target nucleic acids are NOT DETECTED.  The SARS-CoV-2 RNA is generally detectable in upper respiratory specimens during the acute phase of infection. The lowest concentration of SARS-CoV-2 viral copies this assay can detect is 138 copies/mL. A negative result does not preclude SARS-Cov-2 infection and should not be used as the sole basis for treatment or other patient management decisions. A negative result may occur with  improper specimen collection/handling, submission of specimen other than nasopharyngeal swab, presence of viral mutation(s) within the areas targeted by this assay, and inadequate number of viral copies(<138 copies/mL). A negative result must be combined with clinical observations, patient history, and epidemiological information. The expected result is Negative.  Fact Sheet for Patients:  EntrepreneurPulse.com.au  Fact Sheet for Healthcare Providers:  IncredibleEmployment.be  This test is no t yet approved or cleared by the [place] FDA and  has been authorized for detection  and/or diagnosis of SARS-CoV-2 by FDA  under an Emergency Use Authorization (EUA). This EUA will remain  in effect (meaning this test can be used) for the duration of the COVID-19 declaration under Section 564(b)(1) of the Act, 21 U.S.C.section 360bbb-3(b)(1), unless the authorization is terminated  or revoked sooner.       Influenza A by PCR NEGATIVE NEGATIVE Final   Influenza B by PCR NEGATIVE NEGATIVE Final    Comment: (NOTE) The Xpert Xpress SARS-CoV-2/FLU/RSV plus assay is intended as an aid in the diagnosis of influenza from Nasopharyngeal swab specimens and should not be used as a sole basis for treatment. Nasal washings and aspirates are unacceptable for Xpert Xpress SARS-CoV-2/FLU/RSV testing.  Fact Sheet for Patients: EntrepreneurPulse.com.au  Fact Sheet for Healthcare Providers: IncredibleEmployment.be  This test is not yet approved or cleared by the [place] FDA and has been authorized for detection and/or diagnosis of SARS-CoV-2 by FDA under an Emergency Use Authorization (EUA). This EUA will remain in effect (meaning this test can be used) for the duration of the COVID-19 declaration under Section 564(b)(1) of the Act, 21 U.S.C. section 360bbb-3(b)(1), unless the authorization is terminated or revoked.  Performed at [facility], [place] [address] [place], [place] [address]   Blood Culture (routine x 2)     Status: None   Collection Time: 11/14/21  7:28 AM   Specimen: Site Not Specified; Blood  Result Value Ref Range Status   Specimen Description SITE NOT SPECIFIED  Final   Special Requests   Final    BOTTLES DRAWN AEROBIC AND ANAEROBIC Blood Culture adequate volume   Culture   Final    NO GROWTH 5 DAYS Performed at [facility], [place] [address], [place], [place] [address]    Report Status 11/19/2021 FINAL  Final  MRSA Next Gen by PCR, Nasal     Status: None   Collection Time: 11/14/21  1:14 PM   Specimen:  Nasal Mucosa; Nasal Swab  Result Value Ref Range Status   MRSA by PCR Next Gen NOT DETECTED NOT DETECTED Final    Comment: (NOTE) The GeneXpert MRSA Assay (FDA approved for NASAL specimens only), is one component of a comprehensive MRSA colonization surveillance program. It is not intended to diagnose MRSA infection nor to guide or monitor treatment for MRSA infections. Test performance is not FDA approved in patients less than 52 years old. Performed at [facility], [place] [address], [place], [place] [address]   CSF culture w Stat Gram Stain     Status: None   Collection Time: 11/14/21  2:54 PM   Specimen: CSF; Cerebrospinal Fluid  Result Value Ref Range Status   Specimen Description CSF  Final   Special Requests NONE  Final   Gram Stain   Final    WBC PRESENT,BOTH PMN AND MONONUCLEAR NO ORGANISMS SEEN    Culture   Final    NO GROWTH 3 DAYS Performed at [facility], [place] [address] [place], [place] [address]    Report Status 11/18/2021 FINAL  Final  VZV PCR, CSF     Status: None   Collection Time: 11/14/21  5:08 PM   Specimen: CSF; Cerebrospinal Fluid  Result Value Ref Range Status   VZV PCR, CSF Negative Negative Final    Comment: (NOTE) No Varicella Zoster Virus DNA detected. Performed At: [facility] [place], [place] [id number] Rush Farmer MD NL:[id number]          Radiology Studies: No results found.  Scheduled Meds:  amLODipine  10 mg Oral Daily   aspirin  81 mg Oral Daily   Or   aspirin  300 mg Rectal Daily   atorvastatin  40 mg Oral Daily   bisacodyl  10 mg Rectal Once   chlorhexidine gluconate (MEDLINE KIT)  15 mL Mouth Rinse BID   Chlorhexidine Gluconate Cloth  6 each Topical Daily   cloNIDine  0.2 mg Oral TID   feeding supplement  237 mL Oral BID BM   heparin injection (subcutaneous)  5,000 Units Subcutaneous Q8H   hydrALAZINE  100 mg Oral Q8H   insulin aspart  0-9 Units Subcutaneous TID WC   mouth  rinse  15 mL Mouth Rinse q12n4p   metoprolol tartrate  25 mg Oral BID   multivitamin with minerals  1 tablet Oral Daily   Continuous Infusions:   LOS: 8 days      Debbe Odea, MD [facility] Pager: [email or web address] 11/22/2021, 11:23 AM

## 2021-11-22 NOTE — Progress Notes (Signed)
Physical Therapy Treatment Patient Details Name: Manuel Kramer MRN: [id number] DOB: 1955/12/05 Today's Date: 11/22/2021   History of Present Illness 66 y/o male presented to ED on 11/14/21 after being found in his home altered and combative. Unresponsive in ED. EEG suggestive of severe encephalopathy. 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 Intubated 1/16-1/19. PMH: HTN, DM    PT Comments    Pt demonstrated significant improvement from yesterday. He was able to mobilize in the bed to EOB with supervision and min assist with moving from EOB to supine. He was able to sit to stand and ambulate with Mod assist and +2 assistance. During mobility he reported feeling dizzy. Patient was able to participate in conversations but was still moderately confused and did not always give appropriate responses.During treatment session patient showed deficits in strength, endurance, activity tolerance, and cognition. Recommending therapy services at acute inpatient rehab to address the previously stated deficits. Patient has potential to improve during acute care rehabilitation, will update recommendation as needed. Will continue to follow acutely to maximize functional mobility, independence, safety, and cognition.    Recommendations for follow up therapy are one component of a multi-disciplinary discharge planning process, led by the attending physician.  Recommendations may be updated based on patient status, additional functional criteria and insurance authorization.  Follow Up Recommendations  Acute inpatient rehab (3hours/day)     Assistance Recommended at Discharge Frequent or constant Supervision/Assistance  Patient can return home with the following Assistance with feeding;Assistance with cooking/housework;Direct supervision/assist for medications  management;Direct supervision/assist for financial management;Assist for transportation;Help with stairs or ramp for entrance;A lot of help with bathing/dressing/bathroom;Two people to help with walking and/or transfers   Equipment Recommendations  Other (comment) (TBD)    Recommendations for Other Services       Precautions / Restrictions Precautions Precautions: Fall Restrictions Weight Bearing Restrictions: No     Mobility  Bed Mobility Overal bed mobility: Needs Assistance Bed Mobility: Supine to Sit, Sit to Supine Rolling: Supervision   Supine to sit: Supervision, HOB elevated Sit to supine: Min assist   General bed mobility comments: Patient was able to get to EOB with repeated verbal cuing.    Transfers Overall transfer level: Needs assistance Equipment used: Rolling walker (2 wheels) Transfers: Sit to/from Stand Sit to Stand: Mod assist, +2 physical assistance           General transfer comment: Patient reported dizziness when sitting at EOB    Ambulation/Gait Ambulation/Gait assistance: Mod assist, +2 physical assistance Gait Distance (Feet): 7 Feet (3.5 ft forward and 3.5 ft backward in the room) Assistive device: Rolling walker (2 wheels) Gait Pattern/deviations: Step-through pattern, Decreased stride length Gait velocity: Decreased         Stairs             Wheelchair Mobility    Modified Rankin (Stroke Patients Only) Modified Rankin (Stroke Patients Only) Pre-Morbid Rankin Score: No symptoms Modified Rankin: Moderately severe disability     Balance Overall balance assessment: Needs assistance Sitting-balance support: Bilateral upper extremity supported, Feet supported Sitting balance-Leahy Scale: Fair     Standing balance support: Bilateral upper extremity supported Standing balance-Leahy Scale: Poor Standing balance comment: Patient required min assist for balance with UE support and +2 assistance  Cognition Arousal/Alertness: Awake/alert Behavior During Therapy: WFL for tasks assessed/performed Overall Cognitive Status: Impaired/Different from baseline Area of Impairment: Following commands, Safety/judgement, Attention, Memory                   Current Attention Level: Selective, Sustained   Following Commands: Follows one step commands consistently, Follows multi-step commands with increased time Safety/Judgement: Decreased awareness of safety Awareness: Emergent   General Comments: Patient was able to respond to questions but was giving inconsistant and inappropriate responses to questions about home. He was oriented to location.        Exercises      General Comments        Pertinent Vitals/Pain Pain Assessment Pain Assessment: No/denies pain    Home Living                          Prior Function            PT Goals (current goals can now be found in the care plan section)      Frequency    Min 4X/week      PT Plan Current plan remains appropriate    Co-evaluation              AM-PAC PT "6 Clicks" Mobility   Outcome Measure  Help needed turning from your back to your side while in a flat bed without using bedrails?: A Little Help needed moving from lying on your back to sitting on the side of a flat bed without using bedrails?: A Little Help needed moving to and from a bed to a chair (including a wheelchair)?: A Lot Help needed standing up from a chair using your arms (e.g., wheelchair or bedside chair)?: A Lot Help needed to walk in hospital room?: A Lot Help needed climbing 3-5 steps with a railing? : A Lot 6 Click Score: 14    End of Session Equipment Utilized During Treatment: Gait belt Activity Tolerance: Patient limited by fatigue;Patient tolerated treatment well Patient left: in bed;with call bell/phone within reach;with nursing/sitter in room Nurse Communication: Mobility status;Other (comment)  (Hypertensive in seated position) PT Visit Diagnosis: Muscle weakness (generalized) (M62.81);Other abnormalities of gait and mobility (R26.89);Other symptoms and signs involving the nervous system (R29.898)     Time: 2446-2863 PT Time Calculation (min) (ACUTE ONLY): 26 min  Charges:  $Therapeutic Activity: 23-37 mins                     Armanda Heritage, SPT    Armanda Heritage 11/22/2021, 12:49 PM

## 2021-11-22 NOTE — Plan of Care (Signed)
°  Problem: Education: Goal: Knowledge of General Education information will improve Description: Including pain rating scale, medication(s)/side effects and non-pharmacologic comfort measures Outcome: Progressing   Problem: Clinical Measurements: Goal: Ability to maintain clinical measurements within normal limits will improve Outcome: Progressing Goal: Will remain free from infection Outcome: Progressing Goal: Diagnostic test results will improve Outcome: Progressing Goal: Respiratory complications will improve Outcome: Progressing Goal: Cardiovascular complication will be avoided Outcome: Progressing   Problem: Nutrition: Goal: Adequate nutrition will be maintained Outcome: Progressing   Problem: Elimination: Goal: Will not experience complications related to bowel motility Outcome: Progressing Goal: Will not experience complications related to urinary retention Outcome: Progressing   Problem: Safety: Goal: Ability to remain free from injury will improve Outcome: Progressing   Problem: Skin Integrity: Goal: Risk for impaired skin integrity will decrease Outcome: Progressing   Problem: Safety: Goal: Non-violent Restraint(s) Outcome: Progressing

## 2021-11-22 NOTE — Progress Notes (Signed)
Speech Language Pathology Treatment: Cognitive-Linquistic;Dysphagia  Patient Details Name: Manuel Kramer MRN: [id number] DOB: 07/23/1956 Today's Date: 11/22/2021 Time: 1000-1030 SLP Time Calculation (min) (ACUTE ONLY): 30 min  Assessment / Plan / Recommendation Clinical Impression  Pt awake and alert, willing to participate in cognitive interventions. Pt able to choose appropriate words to for the group, use and action of a car with a model from therapist. However, when trying to describe a car pt was perseverative and reductive. Needed a visual cue to name at least three more properties of a car. When asked to repeat the task with a different type of car pt still needed heavy verbal cueing. Pt unable to compare the two items without visual choice cues for word finding and abstract thinking. Pt is unable to describe his vocational history, but is starting to describe more accurate orientation and basic intellectual awareness. Pt making progress, continue to recommend AIR at d/c.   HPI HPI: Pt is a 66 year old man admitted after EMS found him unresponsive and hypotensive; Pt was intubated on 11/14/2021-11/17/2021. MRI scan revealed several small infarcts in right hemisphere.      SLP Plan  Continue with current plan of care      Recommendations for follow up therapy are one component of a multi-disciplinary discharge planning process, led by the attending physician.  Recommendations may be updated based on patient status, additional functional criteria and insurance authorization.    Recommendations  Diet recommendations: Dysphagia 3 (mechanical soft);Thin liquid Liquids provided via: Cup;Straw Medication Administration: Whole meds with liquid Supervision: Patient able to self feed                Follow Up Recommendations: Acute inpatient rehab (3hours/day) Assistance recommended at discharge: PRN SLP Visit Diagnosis: Dysphagia, oropharyngeal phase (R13.12);Aphasia (R47.01);Cognitive  communication deficit (R41.841) Plan: Continue with current plan of care           Coulton Schlink, Katherene Ponto  11/22/2021, 11:18 AM

## 2021-11-23 LAB — BASIC METABOLIC PANEL
Anion gap: 7 (ref 5–15)
BUN: 25 mg/dL — ABNORMAL HIGH (ref 8–23)
CO2: 23 mmol/L (ref 22–32)
Calcium: 8.3 mg/dL — ABNORMAL LOW (ref 8.9–10.3)
Chloride: 102 mmol/L (ref 98–111)
Creatinine, Ser: 1.73 mg/dL — ABNORMAL HIGH (ref 0.61–1.24)
GFR, Estimated: 44 mL/min — ABNORMAL LOW (ref >60)
Glucose, Bld: 228 mg/dL — ABNORMAL HIGH (ref 70–99)
Potassium: 3.9 mmol/L (ref 3.5–5.1)
Sodium: 132 mmol/L — ABNORMAL LOW (ref 135–145)

## 2021-11-23 LAB — GLUCOSE, CAPILLARY
Glucose-Capillary: 236 mg/dL — ABNORMAL HIGH (ref 70–99)
Glucose-Capillary: 227 mg/dL — ABNORMAL HIGH (ref 70–99)
Glucose-Capillary: 254 mg/dL — ABNORMAL HIGH (ref 70–99)
Glucose-Capillary: 225 mg/dL — ABNORMAL HIGH (ref 70–99)
Glucose-Capillary: 266 mg/dL — ABNORMAL HIGH (ref 70–99)
Glucose-Capillary: 180 mg/dL — ABNORMAL HIGH (ref 70–99)

## 2021-11-23 MED ORDER — GLUCERNA SHAKE PO LIQD
237.0000 mL | Freq: Three times a day (TID) | ORAL | Status: DC
  Administered 2021-11-23 – 2021-11-25 (×7): 237 mL via ORAL
  Filled 2021-11-23 (×2): qty 237

## 2021-11-23 MED ORDER — INSULIN GLARGINE-YFGN 100 UNIT/ML ~~LOC~~ SOLN
10.0000 [IU] | Freq: Every day | SUBCUTANEOUS | Status: DC
  Administered 2021-11-23 – 2021-11-25 (×3): 10 [IU] via SUBCUTANEOUS
  Filled 2021-11-23 (×3): qty 0.1

## 2021-11-23 NOTE — Progress Notes (Signed)
Inpatient Rehab Admissions Coordinator:   Stopped by patients room to discuss CIR recommendations this AM.  He was lethargic, and requested I return this afternoon.   Estill Dooms, PT, DPT Admissions Coordinator [phone] 11/23/21  12:49 PM

## 2021-11-23 NOTE — Progress Notes (Signed)
Nutrition Follow-up  DOCUMENTATION CODES:   Severe malnutrition in context of chronic illness  INTERVENTION:  -Recommend consult to diabetes coordinator given consistently elevated CBGs -d/c Ensure Enlive -Glucerna Shake po TID, each supplement provides 220 kcal and 10 grams of protein -snacks TID -diet education provided regarding carb modified/heart healthy diet -continue MVI with minerals daily  NUTRITION DIAGNOSIS:   Severe Malnutrition related to chronic illness as evidenced by severe muscle depletion, severe fat depletion.  ongoing  GOAL:   Patient will meet greater than or equal to 90% of their needs  progressing  MONITOR:   PO intake, Supplement acceptance  REASON FOR ASSESSMENT:   Consult Assessment of nutrition requirement/status  ASSESSMENT:   Pt with PMH of tobacco abuse, DM, HTN, and chronic diarrhea admitted 1/16 after being found by police unresponsive in home; pt with acute metabolic encephalopathy and sepsis.  1/16 intubated 1/18 per MRI pt with several subacute infarcts and numerous microhemorrhages  1/19 extubated 1/20 diet advanced to dysphagia 1 with thin liquids  1/21 tx to [facility] service; diet advanced to dysphagia 3 with thin liquids   Reports not being compliant with medications at home. Lives alone. Reports great appetite and supplement acceptance, but reports still being hungry between meals. Agreeable to snacks. Provided education on heart healthy/carb modified diet. Pt without any additional questions regarding nutrition plan of care at this time.   PO Intake: 30-100% x 6 recorded meals (79% avg meal intake)   UOP: 1x unmeasured occurrence x24 hours I/O: +9844ml since admit  Current weight: 67.1 kg Admit weight: 60 kg   mild pitting edema to BUE and BLE per RN assessment  Medications: dulcolax, Ensure Enlive BID, SSI TID w/ meals, mvi w/ minerals   Labs: Recent Labs  Lab 11/16/21 1646 11/16/21 1646 11/17/21 0457 11/17/21 1620  11/18/21 0333 11/19/21 0235 11/20/21 0310 11/21/21 0339 11/23/21 0329  NA  --    < > 141  --  138   < > 139 141 132*  K  --    < > 3.7  --  3.5   < > 3.7 3.6 3.9  CL  --    < > 110  --  108   < > 107 111 102  CO2  --    < > 23  --  23   < > 24 25 23   BUN  --    < > 24*  --  29*   < > 30* 29* 25*  CREATININE  --    < > 1.83*  --  1.81*   < > 1.55* 1.45* 1.73*  CALCIUM  --    < > 8.4*  --  8.1*   < > 8.6* 8.1* 8.3*  MG 2.1  --  2.2 2.0  --   --   --   --   --   PHOS 4.2  --  4.0 3.5 3.3  --   --   --   --   GLUCOSE  --    < > 101*  --  202*   < > 157* 126* 228*   < > = values in this interval not displayed.  CBGs: [phone]  Diet Order:   Diet Order             DIET DYS 3 Room service appropriate? No; Fluid consistency: Thin  Diet effective now                   EDUCATION  NEEDS:   Not appropriate for education at this time  Skin:  Skin Assessment: Reviewed RN Assessment  Last BM:  1/24 type 7  Height:   Ht Readings from Last 1 Encounters:  11/14/21 5\' 6"  (1.676 m)    Weight:   Wt Readings from Last 1 Encounters:  11/20/21 67.1 kg    BMI:  Body mass index is 23.88 kg/m.  Estimated Nutritional Needs:   Kcal:  1800-2000  Protein:  90-105 grams  Fluid:  >1.8 L/day     11/22/21., MS, RD, LDN (she/her/hers) RD pager number and weekend/on-call pager number located in Amion.

## 2021-11-23 NOTE — Progress Notes (Signed)
Physical Therapy Treatment Patient Details Name: Manuel Kramer MRN: [id number] DOB: [birth year] Today's Date: 11/23/2021   History of Present Illness 66 y/o male presented to ED on 11/14/21 after being found in his home altered and combative. Unresponsive in ED. EEG suggestive of severe encephalopathy. 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 Intubated 1/16-1/19. PMH: HTN, DM    PT Comments    Pt was able to ambulate further today, being able to go out into the hallway and back into the room with UE support on a walker and min assist. He was able to stand at the sink while washing his hands and brushing his teeth. He complained of dizziness while sitting up and standing up that resolved throughout the session. His short term memory was impaired as seen by not being able to remember previous parts of the session. During treatment session patient showed deficits in strength, endurance, activity tolerance, and cognition. Recommending therapy services at acute inpatient rehab to address the previously stated deficits. Will continue to follow acutely to maximize functional mobility, independence, safety, and cognition.   Recommendations for follow up therapy are one component of a multi-disciplinary discharge planning process, led by the attending physician.  Recommendations may be updated based on patient status, additional functional criteria and insurance authorization.  Follow Up Recommendations  Acute inpatient rehab (3hours/day)     Assistance Recommended at Discharge Frequent or constant Supervision/Assistance  Patient can return home with the following Assistance with feeding;Assistance with cooking/housework;Direct supervision/assist for medications management;Direct supervision/assist for financial management;Assist for transportation;Help with  stairs or ramp for entrance;A lot of help with bathing/dressing/bathroom;A lot of help with walking and/or transfers   Equipment Recommendations       Recommendations for Other Services       Precautions / Restrictions Precautions Precautions: Fall Restrictions Weight Bearing Restrictions: No     Mobility  Bed Mobility Overal bed mobility: Needs Assistance Bed Mobility: Supine to Sit, Sit to Supine Rolling: Supervision   Supine to sit: Min assist     General bed mobility comments: Patient requires simple and repetative cuing.    Transfers Overall transfer level: Needs assistance Equipment used: Rolling walker (2 wheels) Transfers: Sit to/from Stand Sit to Stand: Mod assist                Ambulation/Gait Ambulation/Gait assistance: Mod assist, Min assist Gait Distance (Feet): 20 Feet Assistive device: Rolling walker (2 wheels) Gait Pattern/deviations: Step-through pattern, Decreased stride length Gait velocity: Decreased         Stairs             Wheelchair Mobility    Modified Rankin (Stroke Patients Only) Modified Rankin (Stroke Patients Only) Pre-Morbid Rankin Score: No symptoms Modified Rankin: Moderately severe disability     Balance Overall balance assessment: Needs assistance   Sitting balance-Leahy Scale: Fair     Standing balance support: Bilateral upper extremity supported Standing balance-Leahy Scale: Poor Standing balance comment: Patient requires UE support and leaning on the sink with grooming tasks.                            Cognition Arousal/Alertness: Awake/alert Behavior During Therapy: WFL for tasks assessed/performed Overall Cognitive Status: Impaired/Different from baseline Area of Impairment: Attention, Memory, Following commands, Safety/judgement, Awareness, Problem solving, Orientation  Orientation Level: Place Current Attention Level: Selective, Divided Memory: Decreased  short-term memory Following Commands: Follows one step commands consistently, Follows one step commands with increased time Safety/Judgement: Decreased awareness of safety, Decreased awareness of deficits Awareness: Emergent Problem Solving: Slow processing, Decreased initiation, Difficulty sequencing, Requires verbal cues, Requires tactile cues General Comments: Patient was able to recall the mounth but not the year. He demonstrates improvements with memory, being able to remeber providers and some aspects of the previous session but still struggles with short term memory being unable to recall events that happened earlier in the treatment session.        Exercises Other Exercises Other Exercises: Ankle pumps Other Exercises: Seated knee flex/ext Other Exercises: seated marches    General Comments        Pertinent Vitals/Pain Pain Assessment Pain Assessment: No/denies pain    Home Living                          Prior Function            PT Goals (current goals can now be found in the care plan section)      Frequency    Min 4X/week      PT Plan Current plan remains appropriate    Co-evaluation              AM-PAC PT "6 Clicks" Mobility   Outcome Measure  Help needed turning from your back to your side while in a flat bed without using bedrails?: A Little Help needed moving from lying on your back to sitting on the side of a flat bed without using bedrails?: A Little Help needed moving to and from a bed to a chair (including a wheelchair)?: A Lot Help needed standing up from a chair using your arms (e.g., wheelchair or bedside chair)?: A Lot Help needed to walk in hospital room?: A Lot Help needed climbing 3-5 steps with a railing? : A Lot 6 Click Score: 14    End of Session Equipment Utilized During Treatment: Gait belt Activity Tolerance: Patient limited by fatigue;Patient tolerated treatment well Patient left: in bed;in chair;with chair  alarm set   PT Visit Diagnosis: Muscle weakness (generalized) (M62.81);Other abnormalities of gait and mobility (R26.89);Other symptoms and signs involving the nervous system (R29.898)     Time: 1532-1610 PT Time Calculation (min) (ACUTE ONLY): 38 min  Charges:  $Therapeutic Exercise: 8-22 mins $Therapeutic Activity: 23-37 mins                     Armanda Heritage, SPT    Armanda Heritage 11/23/2021, 5:07 PM

## 2021-11-23 NOTE — Plan of Care (Signed)
°  Problem: Education: °Goal: Knowledge of General Education information will improve °Description: Including pain rating scale, medication(s)/side effects and non-pharmacologic comfort measures °Outcome: Progressing °  °Problem: Clinical Measurements: °Goal: Ability to maintain clinical measurements within normal limits will improve °Outcome: Progressing °Goal: Will remain free from infection °Outcome: Progressing °Goal: Diagnostic test results will improve °Outcome: Progressing °Goal: Respiratory complications will improve °Outcome: Progressing °Goal: Cardiovascular complication will be avoided °Outcome: Progressing °  °Problem: Nutrition: °Goal: Adequate nutrition will be maintained °Outcome: Progressing °  °Problem: Elimination: °Goal: Will not experience complications related to bowel motility °Outcome: Progressing °Goal: Will not experience complications related to urinary retention °Outcome: Progressing °  °Problem: Safety: °Goal: Ability to remain free from injury will improve °Outcome: Progressing °  °Problem: Skin Integrity: °Goal: Risk for impaired skin integrity will decrease °Outcome: Progressing °  °Problem: Safety: °Goal: Non-violent Restraint(s) °Outcome: Progressing °  °

## 2021-11-23 NOTE — Progress Notes (Signed)
PROGRESS NOTE  Manuel Kramer  DOB: Mar 29, 1956  PCP: Vonna Drafts, FNP LXB:[id number]  DOA: 11/14/2021  LOS: 9 days  Hospital Day: 10  Chief Complaint  Patient presents with   Altered Mental Status   Brief narrative: Manuel Kramer is a 66 y.o. male with PMH significant for DM2, HTN, chronic diarrhea who lives at home alone. Patient was brought to the ED on 11/14/2021 by EMS for altered mental status. His neighbors noted his medical alarm going at 1:30 AM and hence EMS and police were involved.  EMS found patient agitated and combative with nonverbal status and nonpurposeful movements.  Patient was brought to the ED.  In the ED he was noted to have left-sided gaze preference.  His GCS was low and needed emergent intubation.  He was bolused with Keppra, placed on nitro infusion, later on Cleviprex for hypertension. Admitted to ICU 1/18, MRI brain showed acute infarcts within the right frontal operculum, right internal capsule, right frontoparietal periventricular white matter and posterior right frontal lobe.  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  Mild to moderate generalized cerebral atrophy and mild cerebellar atrophy. 1/19, extubated 1/21, transfer out of ICU to [facility]  Subjective: Patient was seen and examined this morning.  Middle-aged African-American male.  Sleeping, opens eyes on verbal command.  Knows he is in the hospital. Able to follow motor commands.  Assessment/Plan: Multifocal lacunar infarcts -Likely due to dehydration in the setting of small vessel disease and chronic hypertensive microangiopathy -Echo with bubble showed EF of 60 to 65%, no wall motion abnormality. -Neurology consultation was obtained.   -Stroke work-up completed.  Echo with EF 60 to 65%, severe LVH, no atrial level  shunt. -Carotid artery duplex without significant stenosis -A1c 9.9, LDL 88 -Neurology recommended aspirin 81 mg daily and Lipitor 40 mg at bedtime. -PT/OT/ST eval obtained.  CIR recommended.  Uncontrolled hypertension -Patient probably had chronic uncontrolled hypertension leading to hypertensive microangiopathy, severe LVH -Currently blood pressure is mostly 150s with metoprolol, clonidine, amlodipine and hydralazine.  -Continue to monitor blood pressure  Dysphagia -Dysphagia 3 diet per speech.  Acute metabolic encephalopathy -Probably due to stroke self.  In the ED, patient was febrile as well.  Further work-up did not suggest any neurological infection.  Currently not on any antibiotics or antiviral.   -Mental status gradually improving.  Uncontrolled type 2 diabetes mellitus with hyperglycemia -A1c 9.9 on 11/14/2021 -Home meds include Lantus 30 to 64 units at bedtime, empagliflozin-linagliptin daily -Currently on sliding scale insulin only. -Blood sugar level currently consistently over 200.  Start on Semglee 10 units daily. Recent Labs  Lab 11/22/21 1715 11/22/21 2134 11/23/21 0359 11/23/21 0619 11/23/21 1135  GLUCAP 269* 257* 236* 227* 254*   AKI on CKD 3B -Baseline creatinine 1.4 from 2021.  This is present, creatinine increased to peak at 2.06.  Improving steadily, slightly up today compared to yesterday.  Continue to monitor. Recent Labs    11/14/21 0420 11/14/21 1219 11/15/21 4163 11/16/21 0721 11/17/21 0457 11/18/21 0333 11/19/21 0235 11/20/21 0310 11/21/21 0339 11/23/21 0329  BUN _0 24* 24* 29* 29* 30* 29* 25*  CREATININE 1.68* 1.83* 2.05* 2.06* 1.83* 1.81* 1.62* 1.55* 1.45* 1.73*   Mobility: PT eval obtained. Living condition: Was at home Goals of care:   Code Status: Full Code  Nutritional status: Body mass index is 23.88 kg/m.  Nutrition Problem: Severe  Malnutrition Etiology: chronic illness Signs/Symptoms: severe muscle depletion, severe  fat depletion Diet:  Diet Order             DIET DYS 3 Room service appropriate? No; Fluid consistency: Thin  Diet effective now                  DVT prophylaxis:  heparin injection 5,000 Units Start: 11/16/21 1400 SCDs Start: 11/14/21 0830   Antimicrobials: None Fluid: None Consultants: Neurology Family Communication: None at bedside  Status is: Inpatient  Continue in-hospital care because: Pending CIR Level of care: Med-Surg   Dispo: The patient is from: Home              Anticipated d/c is to: CIR              Patient currently is not medically stable to d/c.   Difficult to place patient No     Infusions:    Scheduled Meds:  amLODipine  10 mg Oral Daily   aspirin  81 mg Oral Daily   atorvastatin  40 mg Oral Daily   bisacodyl  10 mg Rectal Once   chlorhexidine gluconate (MEDLINE KIT)  15 mL Mouth Rinse BID   cloNIDine  0.2 mg Transdermal Weekly   [START ON 11/24/2021] cloNIDine  0.1 mg Oral TID   feeding supplement  237 mL Oral BID BM   heparin injection (subcutaneous)  5,000 Units Subcutaneous Q8H   hydrALAZINE  50 mg Oral Q8H   insulin aspart  0-9 Units Subcutaneous TID WC   insulin glargine-yfgn  10 Units Subcutaneous Daily   mouth rinse  15 mL Mouth Rinse q12n4p   metoprolol tartrate  50 mg Oral BID   multivitamin with minerals  1 tablet Oral Daily    PRN meds: dextrose   Antimicrobials: Anti-infectives (From admission, onward)    Start     Dose/Rate Route Frequency Ordered Stop   11/15/21 1000  vancomycin (VANCOREADY) IVPB 750 mg/150 mL  Status:  Discontinued        750 mg 150 mL/hr over 60 Minutes Intravenous Every 24 hours 11/14/21 0556 11/16/21 1045   11/14/21 2200  ceFEPIme (MAXIPIME) 2 g in sodium chloride 0.9 % 100 mL IVPB  Status:  Discontinued        2 g 200 mL/hr over 30 Minutes Intravenous Every 12 hours 11/14/21 0556 11/16/21 1045   11/14/21 1945  acyclovir (ZOVIRAX) 600 mg in dextrose 5 % 100 mL IVPB  Status:  Discontinued         10 mg/kg  60 kg 112 mL/hr over 60 Minutes Intravenous Every 12 hours 11/14/21 1847 11/18/21 0927   11/14/21 0530  ceFEPIme (MAXIPIME) 2 g in sodium chloride 0.9 % 100 mL IVPB        2 g 200 mL/hr over 30 Minutes Intravenous  Once 11/14/21 0523 11/14/21 0633   11/14/21 0530  metroNIDAZOLE (FLAGYL) IVPB 500 mg        500 mg 100 mL/hr over 60 Minutes Intravenous  Once 11/14/21 0523 11/14/21 0713   11/14/21 0530  vancomycin (VANCOCIN) IVPB 1000 mg/200 mL premix        1,000 mg 200 mL/hr over 60 Minutes Intravenous  Once 11/14/21 0523 11/14/21 0811       Objective: Vitals:   11/23/21 0817 11/23/21 1136  BP: (!) 156/80 (!) 154/84  Pulse: 67 66  Resp: 20 18  Temp: 98.2 F (36.8 C) 98.2 F (36.8 C)  SpO2: 99% 100%  No intake or output data in the 24 hours ending 11/23/21 1256  Filed Weights   11/16/21 0500 11/17/21 0500 11/20/21 0400  Weight: 61 kg 61 kg 67.1 kg   Weight change:  Body mass index is 23.88 kg/m.   Physical Exam: General exam: Pleasant elderly African-American male.  Not in physical distress Skin: No rashes, lesions or ulcers. HEENT: Atraumatic, normocephalic, no obvious bleeding Lungs: Clear to auscultation bilaterally CVS: Regular rate and rhythm, no murmur GI/Abd soft, nontender, nondistended, present CNS: Alert, awake, oriented to place and person Psychiatry: Mood appropriate Extremities: No pedal edema, no calf tenderness  Data Review: I have personally reviewed the laboratory data and studies available.  F/u labs ordered Unresulted Labs (From admission, onward)     Start     Ordered   11/24/21 0500  CBC with Differential/Platelet  Tomorrow morning,   R       Question:  Specimen collection method  Answer:  Lab=Lab collect   11/23/21 1256   11/24/21 1219  Basic metabolic panel  Tomorrow morning,   R       Question:  Specimen collection method  Answer:  Lab=Lab collect   11/23/21 1256            Signed, Terrilee Croak, MD [facility] 11/23/2021

## 2021-11-23 NOTE — Progress Notes (Signed)
Inpatient Rehab Admissions Coordinator:   Met with patient at the bedside to discuss CIR.  He is awake, alert, and oriented.  We discussed that CIR would be 3 hrs/day of therapy, with average length of stay to be about 2 weeks, dependent upon progress.  I let him know that the majority of our patients are recommended to d/c home with support from someone, typically 24/7 supervision.  He states prior to this CVA he lived alone, but was hoping to discharge home with his ex-wife and his daughter.  He states that he has not discussed this with them yet, and when I asked if he would be okay with me calling, he said he preferred to call them first.  I will f/u with him tomorrow.   Shann Medal, PT, DPT Admissions Coordinator [phone] 11/23/21  3:18 PM

## 2021-11-23 NOTE — Discharge Instructions (Signed)
Heart Healthy, Consistent Carbohydrate Nutrition Therapy   A heart-healthy and consistent carbohydrate diet is recommended to manage heart disease and diabetes. To follow a heart-healthy and consistent carbohydrate diet, Eat a balanced diet with whole grains, fruits and vegetables, and lean protein sources.  Choose heart-healthy unsaturated fats. Limit saturated fats, trans fats, and cholesterol intake. Eat more plant-based or vegetarian meals using beans and soy foods for protein.  Eat whole, unprocessed foods to limit the amount of sodium (salt) you eat.  Choose a consistent amount of carbohydrate at each meal and snack. Limit refined carbohydrates especially sugar, sweets and sugar-sweetened beverages.  If you drink alcohol, do so in moderation: one serving per day (women) and two servings per day (men). o One serving is equivalent to 12 ounces beer, 5 ounces wine, or 1.5 ounces distilled spirits  Tips Tips for Choosing Heart-Healthy Fats Choose lean protein and low-fat dairy foods to reduce saturated fat intake. Saturated fat is usually found in animal-based protein and is associated with certain health risks. Saturated fat is the biggest contributor to raise low-density lipoprotein (LDL) cholesterol levels. Research shows that limiting saturated fat lowers unhealthy cholesterol levels. Eat no more than 7% of your total calories each day from saturated fat. Ask your RDN to help you determine how much saturated fat is right for you. There are many foods that do not contain large amounts of saturated fats. Swapping these foods to replace foods high in saturated fats will help you limit the saturated fat you eat and improve your cholesterol levels. You can also try eating more plant-based or vegetarian meals. Instead of. Try:  Whole milk, cheese, yogurt, and ice cream 1% or skim milk, low-fat cheese, non-fat yogurt, and low-fat ice cream  Fatty, marbled beef and pork Lean beef, pork, or venison   Poultry with skin Poultry without skin  Butter, stick margarine Reduced-fat, whipped, or liquid spreads  Coconut oil, palm oil Liquid vegetable oils: corn, canola, olive, soybean and safflower oils   Avoid foods that contain trans fats. Trans fats increase levels of LDL-cholesterol. Hydrogenated fat in processed foods is the main source of trans fats in foods.  Trans fats can be found in stick margarine, shortening, processed sweets, baked goods, some fried foods, and packaged foods made with hydrogenated oils. Avoid foods with "partially hydrogenated oil" on the ingredient list such as: cookies, pastries, baked goods, biscuits, crackers, microwave popcorn, and frozen dinners. Choose foods with heart healthy fats. Polyunsaturated and monounsaturated fat are unsaturated fats that may help lower your blood cholesterol level when used in place of saturated fat in your diet. Ask your RDN about taking a dietary supplement with plant sterols and stanols to help lower your cholesterol level. Research shows that substituting saturated fats with unsaturated fats is beneficial to cholesterol levels. Try these easy swaps: Instead of. Try:  Butter, stick margarine, or solid shortening Reduced-fat, whipped, or liquid spreads  Beef, pork, or poultry with skin Fish and seafood  Chips, crackers, snack foods Raw or unsalted nuts and seeds or nut butters Hummus with vegetables Avocado on toast  Coconut oil, palm oil Liquid vegetable oils: corn, canola, olive, soybean and safflower oils  Limit the amount of cholesterol you eat to less than 200 milligrams per day. Cholesterol is a substance carried through the bloodstream via lipoproteins, which are known as "transporters" of fat. Some body functions need cholesterol to work properly, but too much cholesterol in the bloodstream can damage arteries and build up blood vessel linings (  which can lead to heart attack and stroke). You should eat less than 200 milligrams  cholesterol per day. People respond differently to eating cholesterol. There is no test available right now that can figure out which people will respond more to dietary cholesterol and which will respond less. For individuals with high intake of dietary cholesterol, different types of increase (none, small, moderate, large) in LDL-cholesterol levels are all possible.  Food sources of cholesterol include egg yolks and organ meats such as liver, gizzards. Limit egg yolks to two to four per week and avoid organ meats like liver and gizzards to control cholesterol intake. Tips for Choosing Heart-Healthy Carbohydrates Consume a consistent amount of carbohydrate It is important to eat foods with carbohydrates in moderation because they impact your blood glucose level. Carbohydrates can be found in many foods such as: Grains (breads, crackers, rice, pasta, and cereals)  Starchy Vegetables (potatoes, corn, and peas)  Beans and legumes  Milk, soy milk, and yogurt  Fruit and fruit juice  Sweets (cakes, cookies, ice cream, jam and jelly) Your RDN will help you set a goal for how many carbohydrate servings to eat at your meals and snacks. For many adults, eating 3 to 5 servings of carbohydrate foods at each meal and 1 or 2 carbohydrate servings for each snack works well.  Check your blood glucose level regularly. It can tell you if you need to adjust when you eat carbohydrates. Choose foods rich in viscous (soluble) fiber Viscous, or soluble, is found in the walls of plant cells. Viscous fiber is found only in plant-based foods. Eating foods with fiber helps to lower your unhealthy cholesterol and keep your blood glucose in range  Rich sources of viscous fiber include vegetables (asparagus, Brussels sprouts, sweet potatoes, turnips) fruit (apricots, mangoes, oranges), legumes, and whole grains (barley, oats, and oat bran).  As you increase your fiber intake gradually, also increase the amount of water you  drink. This will help prevent constipation.  If you have difficulty achieving this goal, ask your RDN about fiber laxatives. Choose fiber supplements made with viscous fibers such as psyllium seed husks or methylcellulose to help lower unhealthy cholesterol.  Limit refined carbohydrates  There are three types of carbohydrates: starches, sugar, and fiber. Some carbohydrates occur naturally in food, like the starches in rice or corn or the sugars in fruits and milk. Refined carbohydrates--foods with high amounts of simple sugars--can raise triglyceride levels. High triglyceride levels are associated with coronary heart disease. Some examples of refined carbohydrate foods are table sugar, sweets, and beverages sweetened with added sugar. Tips for Reducing Sodium (Salt) Although sodium is important for your body to function, too much sodium can be harmful for people with high blood pressure. As sodium and fluid buildup in your tissues and bloodstream, your blood pressure increases. High blood pressure may cause damage to other organs and increase your risk for a stroke. Even if you take a pill for blood pressure or a water pill (diuretic) to remove fluid, it is still important to have less salt in your diet. Ask your doctor and RDN what amount of sodium is right for you. Avoid processed foods. Eat more fresh foods.  Fresh fruits and vegetables are naturally low in sodium, as well as frozen vegetables and fruits that have no added juices or sauces.  Fresh meats are lower in sodium than processed meats, such as bacon, sausage, and hotdogs. Read the nutrition label or ask your butcher to help you find a   fresh meat that is low in sodium. Eat less salt--at the table and when cooking.  A single teaspoon of table salt has 2,300 mg of sodium.  Leave the salt out of recipes for pasta, casseroles, and soups.  Ask your RDN how to cook your favorite recipes without sodium Be a smart shopper.  Look for food packages  that say "salt-free" or "sodium-free." These items contain less than 5 milligrams of sodium per serving.  "Very low-sodium" products contain less than 35 milligrams of sodium per serving.  "Low-sodium" products contain less than 140 milligrams of sodium per serving.  Beware for "Unsalted" or "No Added Salt" products. These items may still be high in sodium. Check the nutrition label. Add flavors to your food without adding sodium.  Try lemon juice, lime juice, fruit juice or vinegar.  Dry or fresh herbs add flavor. Try basil, bay leaf, dill, rosemary, parsley, sage, dry mustard, nutmeg, thyme, and paprika.  Pepper, red pepper flakes, and cayenne pepper can add spice t your meals without adding sodium. Hot sauce contains sodium, but if you use just a drop or two, it will not add up to much.  Buy a sodium-free seasoning blend or make your own at home. Additional Lifestyle Tips Achieve and maintain a healthy weight. Talk with your RDN or your doctor about what is a healthy weight for you. Set goals to reach and maintain that weight.  To lose weight, reduce your calorie intake along with increasing your physical activity. A weight loss of 10 to 15 pounds could reduce LDL-cholesterol by 5 milligrams per deciliter. Participate in physical activity. Talk with your health care team to find out what types of physical activity are best for you. Set a plan to get about 30 minutes of exercise on most days.  Foods Recommended Food Group Foods Recommended  Grains Whole grain breads and cereals, including whole wheat, barley, rye, buckwheat, corn, teff, quinoa, millet, amaranth, [place] or wild rice, sorghum, and oats Pasta, especially whole wheat or other whole grain types  [place] rice, quinoa or wild rice Whole grain crackers, bread, rolls, pitas Home-made bread with reduced-sodium baking soda  Protein Foods Lean cuts of beef and pork (loin, leg, round, extra lean hamburger)  Skinless poultry Fish Venison  and other wild game Dried beans and peas Nuts and nut butters Meat alternatives made with soy or textured vegetable protein  Egg whites or egg substitute Cold cuts made with lean meat or soy protein  Dairy Nonfat (skim), low-fat, or 1%-fat milk  Nonfat or low-fat yogurt or cottage cheese Fat-free and low-fat cheese  Vegetables Fresh, frozen, or canned vegetables without added fat or salt   Fruits Fresh, frozen, canned, or dried fruit   Oils Unsaturated oils (corn, olive, peanut, soy, sunflower, canola)  Soft or liquid margarines and vegetable oil spreads  Salad dressings Seeds and nuts  Avocado   Foods Not Recommended Food Group Foods Not Recommended  Grains Breads or crackers topped with salt Cereals (hot or cold) with more than 300 mg sodium per serving Biscuits, cornbread, and other "quick" breads prepared with baking soda Bread crumbs or stuffing mix from a store High-fat bakery products, such as doughnuts, biscuits, croissants, danish pastries, pies, cookies Instant cooking foods to which you add hot water and stir--potatoes, noodles, rice, etc. Packaged starchy foods--seasoned noodle or rice dishes, stuffing mix, macaroni and cheese dinner Snacks made with partially hydrogenated oils, including chips, cheese puffs, snack mixes, regular crackers, butter-flavored popcorn  Protein Foods   Higher-fat cuts of meats (ribs, t-bone steak, regular hamburger) Bacon, sausage, or hot dogs Cold cuts, such as salami or bologna, deli meats, cured meats, corned beef Organ meats (liver, brains, gizzards, sweetbreads) Poultry with skin Fried or smoked meat, poultry, and fish Whole eggs and egg yolks (more than 2-4 per week) Salted legumes, nuts, seeds, or nut/seed butters Meat alternatives with high levels of sodium (>300 mg per serving) or saturated fat (>5 g per serving)  Dairy Whole milk,?2% fat milk, buttermilk Whole milk yogurt or ice cream Cream Half-&-half Cream cheese Sour  cream Cheese  Vegetables Canned or frozen vegetables with salt, fresh vegetables prepared with salt, butter, cheese, or cream sauce Fried vegetables Pickled vegetables such as olives, pickles, or sauerkraut  Fruits Fried fruits Fruits served with butter or cream  Oils Butter, stick margarine, shortening Partially hydrogenated oils or trans fats Tropical oils (coconut, palm, palm kernel oils)  Other Candy, sugar sweetened soft drinks and desserts Salt, sea salt, garlic salt, and seasoning mixes containing salt Bouillon cubes Ketchup, barbecue sauce, Worcestershire sauce, soy sauce, teriyaki sauce Miso Salsa Pickles, olives, relish   Heart Healthy Consistent Carbohydrate Vegetarian (Lacto-Ovo) Sample 1-Day Menu  Breakfast 1 cup oatmeal, cooked (2 carbohydrate servings)   cup blueberries (1 carbohydrate serving)  11 almonds, without salt  1 cup 1% milk (1 carbohydrate serving)  1 cup coffee  Morning Snack 1 cup fat-free plain yogurt (1 carbohydrate serving)  Lunch 1 whole wheat bun (1 carbohydrate servings)  1 black bean burger (1 carbohydrate servings)  1 slice cheddar cheese, low sodium  2 slices tomatoes  2 leaves lettuce  1 teaspoon mustard  1 small pear (1 carbohydrate servings)  1 cup green tea, unsweetened  Afternoon Snack 1/3 cup trail mix with nuts, seeds, and raisins, without salt (1 carbohydrate servinga)  Evening Meal  cup meatless chicken  2/3 cup [place] rice, cooked (2 carbohydrate servings)  1 cup broccoli, cooked (2/3 carbohydrate serving)   cup carrots, cooked (1/3 carbohydrate serving)  2 teaspoons olive oil  1 teaspoon balsamic vinegar  1 whole wheat dinner roll (1 carbohydrate serving)  1 teaspoon margarine, soft, tub  1 cup 1% milk (1 carbohydrate serving)  Evening Snack 1 extra small banana (1 carbohydrate serving)  1 tablespoon peanut butter   Heart Healthy Consistent Carbohydrate Vegan Sample 1-Day Menu  Breakfast 1 cup oatmeal, cooked (2  carbohydrate servings)   cup blueberries (1 carbohydrate serving)  11 almonds, without salt  1 cup soymilk fortified with calcium, vitamin B12, and vitamin D  1 cup coffee  Morning Snack 6 ounces soy yogurt (1 carbohydrate servings)  Lunch 1 whole wheat bun(1 carbohydrate servings)  1 black bean burger (1 carbohydrate serving)  2 slices tomatoes  2 leaves lettuce  1 teaspoon mustard  1 small pear (1 carbohydrate servings)  1 cup green tea, unsweetened  Afternoon Snack 1/3 cup trail mix with nuts, seeds, and raisins, without salt (1 carbohydrate servings)  Evening Meal  cup meatless chicken  2/3 cup [place] rice, cooked (2 carbohydrate servings)  1 cup broccoli, cooked (2/3 carbohydrate serving)   cup carrots, cooked (1/3 carbohydrate serving)  2 teaspoons olive oil  1 teaspoon balsamic vinegar  1 whole wheat dinner roll (1 carbohydrate serving)  1 teaspoon margarine, soft, tub  1 cup soymilk fortified with calcium, vitamin B12, and vitamin D  Evening Snack 1 extra small banana (1 carbohydrate serving)  1 tablespoon peanut butter    Heart Healthy Consistent Carbohydrate Sample   1-Day Menu  Breakfast 1 cup cooked oatmeal (2 carbohydrate servings)  3/4 cup blueberries (1 carbohydrate serving)  1 ounce almonds  1 cup skim milk (1 carbohydrate serving)  1 cup coffee  Morning Snack 1 cup sugar-free nonfat yogurt (1 carbohydrate serving)  Lunch 2 slices whole-wheat bread (2 carbohydrate servings)  2 ounces lean turkey breast  1 ounce low-fat Swiss cheese  1 teaspoon mustard  1 slice tomato  1 lettuce leaf  1 small pear (1 carbohydrate serving)  1 cup skim milk (1 carbohydrate serving)  Afternoon Snack 1 ounce trail mix with unsalted nuts, seeds, and raisins (1 carbohydrate serving)  Evening Meal 3 ounces salmon  2/3 cup cooked [place] rice (2 carbohydrate servings)  1 teaspoon soft margarine  1 cup cooked broccoli with 1/2 cup cooked carrots (1 carbohydrate serving  Carrots,  cooked, boiled, drained, without salt  1 cup lettuce  1 teaspoon olive oil with vinegar for dressing  1 small whole grain roll (1 carbohydrate serving)  1 teaspoon soft margarine  1 cup unsweetened tea  Evening Snack 1 extra-small banana (1 carbohydrate serving)  Copyright 2020  [employer]. All rights reserved.  

## 2021-11-24 LAB — CBC WITH DIFFERENTIAL/PLATELET
Abs Immature Granulocytes: 0.26 10*3/uL — ABNORMAL HIGH (ref 0.00–0.07)
Basophils Absolute: 0 10*3/uL (ref 0.0–0.1)
Basophils Relative: 0 %
Eosinophils Absolute: 0.3 10*3/uL (ref 0.0–0.5)
Eosinophils Relative: 2 %
HCT: 24.8 % — ABNORMAL LOW (ref 39.0–52.0)
Hemoglobin: 7.9 g/dL — ABNORMAL LOW (ref 13.0–17.0)
Immature Granulocytes: 2 %
Lymphocytes Relative: 13 %
Lymphs Abs: 1.6 10*3/uL (ref 0.7–4.0)
MCH: 31.9 pg (ref 26.0–34.0)
MCHC: 31.9 g/dL (ref 30.0–36.0)
MCV: 100 fL (ref 80.0–100.0)
Monocytes Absolute: 1.2 10*3/uL — ABNORMAL HIGH (ref 0.1–1.0)
Monocytes Relative: 10 %
Neutro Abs: 9 10*3/uL — ABNORMAL HIGH (ref 1.7–7.7)
Neutrophils Relative %: 73 %
Platelets: 370 10*3/uL (ref 150–400)
RBC: 2.48 MIL/uL — ABNORMAL LOW (ref 4.22–5.81)
RDW: 14.7 % (ref 11.5–15.5)
WBC: 12.3 10*3/uL — ABNORMAL HIGH (ref 4.0–10.5)
nRBC: 0.3 % — ABNORMAL HIGH (ref 0.0–0.2)

## 2021-11-24 LAB — BASIC METABOLIC PANEL
Anion gap: 5 (ref 5–15)
BUN: 26 mg/dL — ABNORMAL HIGH (ref 8–23)
CO2: 23 mmol/L (ref 22–32)
Calcium: 8.4 mg/dL — ABNORMAL LOW (ref 8.9–10.3)
Chloride: 110 mmol/L (ref 98–111)
Creatinine, Ser: 1.62 mg/dL — ABNORMAL HIGH (ref 0.61–1.24)
GFR, Estimated: 47 mL/min — ABNORMAL LOW (ref >60)
Glucose, Bld: 165 mg/dL — ABNORMAL HIGH (ref 70–99)
Potassium: 4.2 mmol/L (ref 3.5–5.1)
Sodium: 138 mmol/L (ref 135–145)

## 2021-11-24 LAB — GLUCOSE, CAPILLARY
Glucose-Capillary: 161 mg/dL — ABNORMAL HIGH (ref 70–99)
Glucose-Capillary: 249 mg/dL — ABNORMAL HIGH (ref 70–99)
Glucose-Capillary: 196 mg/dL — ABNORMAL HIGH (ref 70–99)
Glucose-Capillary: 143 mg/dL — ABNORMAL HIGH (ref 70–99)

## 2021-11-24 MED ORDER — LINAGLIPTIN 5 MG PO TABS
5.0000 mg | ORAL_TABLET | Freq: Every day | ORAL | Status: DC
  Administered 2021-11-24 – 2021-12-05 (×12): 5 mg via ORAL
  Filled 2021-11-24 (×13): qty 1

## 2021-11-24 MED ORDER — EMPAGLIFLOZIN 10 MG PO TABS
10.0000 mg | ORAL_TABLET | Freq: Every day | ORAL | Status: DC
  Administered 2021-11-24 – 2021-12-05 (×12): 10 mg via ORAL
  Filled 2021-11-24 (×13): qty 1

## 2021-11-24 MED ORDER — EMPAGLIFLOZIN-LINAGLIPTIN 10-5 MG PO TABS: ORAL_TABLET | Freq: Every day | ORAL | Status: DC

## 2021-11-24 MED ORDER — HYDRALAZINE HCL 50 MG PO TABS
100.0000 mg | ORAL_TABLET | Freq: Three times a day (TID) | ORAL | Status: DC
  Administered 2021-11-24 – 2021-12-05 (×34): 100 mg via ORAL
  Filled 2021-11-24 (×35): qty 2

## 2021-11-24 NOTE — Plan of Care (Signed)
°  Problem: Education: °Goal: Knowledge of General Education information will improve °Description: Including pain rating scale, medication(s)/side effects and non-pharmacologic comfort measures °Outcome: Progressing °  °Problem: Clinical Measurements: °Goal: Ability to maintain clinical measurements within normal limits will improve °Outcome: Progressing °Goal: Will remain free from infection °Outcome: Progressing °Goal: Diagnostic test results will improve °Outcome: Progressing °Goal: Respiratory complications will improve °Outcome: Progressing °Goal: Cardiovascular complication will be avoided °Outcome: Progressing °  °Problem: Nutrition: °Goal: Adequate nutrition will be maintained °Outcome: Progressing °  °Problem: Elimination: °Goal: Will not experience complications related to bowel motility °Outcome: Progressing °Goal: Will not experience complications related to urinary retention °Outcome: Progressing °  °Problem: Safety: °Goal: Ability to remain free from injury will improve °Outcome: Progressing °  °Problem: Skin Integrity: °Goal: Risk for impaired skin integrity will decrease °Outcome: Progressing °  °Problem: Safety: °Goal: Non-violent Restraint(s) °Outcome: Progressing °  °

## 2021-11-24 NOTE — NC FL2 (Signed)
[place] LEVEL OF CARE SCREENING TOOL     IDENTIFICATION  Patient Name: Manuel Kramer Birthdate: Apr 03, 1956 Sex: male Admission Date (Current Location): 11/14/2021  [facility] and [place] Number:  [employer] and Address:  [facility] 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   Protein-calorie malnutrition, severe 11/16/2021   Acute encephalopathy 11/14/2021   AKI (acute kidney injury) ([place])    Hyperglycemia    Hypertensive emergency without congestive heart failure    Sepsis ([place])    Hyperlipidemia 05/22/2016   Diabetic neuropathy ([place]) 05/22/2016   Other specified diabetes mellitus without complications ([place]) 92/33/0076   Essential hypertension 10/02/2014   Smoking 10/02/2014   Family history of prostate cancer 10/02/2014   Renal insufficiency 10/02/2014    Orientation RESPIRATION BLADDER Height & Weight     Self, Situation  Normal Continent Weight: 147 lb 14.9 oz (67.1 kg) Height:  _0  (167.6 cm)  BEHAVIORAL SYMPTOMS/MOOD NEUROLOGICAL BOWEL NUTRITION STATUS      Incontinent Diet (see DC summary)  AMBULATORY STATUS COMMUNICATION OF NEEDS Skin   Limited Assist Verbally Normal                       Personal Care Assistance Level of Assistance  Bathing, Feeding, Dressing Bathing Assistance: Limited assistance Feeding assistance: Independent Dressing Assistance: Limited assistance     Functional Limitations Info             [place]   PT (By licensed PT), OT (By licensed OT), Speech therapy     PT Frequency: 5x/wk OT Frequency: 5x/wk     Speech Therapy Frequency: 5x/wk      Contractures Contractures Info: Not present    Additional Factors Info  Code Status, Allergies, Insulin Sliding Scale Code Status Info: Full Allergies Info: NKA   Insulin Sliding Scale Info: see DC summary       Current Medications (11/24/2021):  This is the current hospital active medication list Current Facility-Administered Medications  Medication Dose Route Frequency Provider Last Rate Last Admin   amLODipine (NORVASC) tablet 10 mg  10 mg Oral Daily Hunsucker, Bonna Gains, MD   10 mg at 11/24/21 1047   aspirin chewable tablet 81 mg  81 mg Oral Daily Hunsucker, Bonna Gains, MD   81 mg at 11/24/21 1047   atorvastatin (LIPITOR) tablet 40 mg  40 mg Oral Daily de Yolanda Manges, Cortney E, NP   40 mg at 11/24/21 1046   bisacodyl (DULCOLAX) suppository 10 mg  10 mg Rectal Once Debbe Odea, MD       chlorhexidine gluconate (MEDLINE KIT) (PERIDEX) 0.12 % solution 15 mL  15 mL Mouth Rinse BID Hunsucker, Bonna Gains, MD   15 mL at 11/24/21 1045   cloNIDine (CATAPRES - Dosed in mg/24 hr) patch 0.2 mg  0.2 mg Transdermal Weekly Ardyth Harps B, RPH   0.2 mg at 11/22/21 1753   cloNIDine (CATAPRES) tablet 0.1 mg  0.1 mg Oral  TID Mignon Pine, RPH       dextrose 50 % solution 0-50 mL  0-50 mL Intravenous PRN Cardama, Grayce Sessions, MD       empagliflozin (JARDIANCE) tablet 10 mg  10 mg Oral Daily Reome, Earle J, RPH   10 mg at 11/24/21 1219   And   linagliptin (TRADJENTA) tablet 5 mg  5 mg Oral Daily Reome, Earle J, RPH   5 mg at 11/24/21 1219   feeding supplement (GLUCERNA SHAKE) (GLUCERNA SHAKE) liquid 237 mL  237 mL Oral TID BM Dahal, Marlowe Aschoff, MD   237 mL at 11/24/21 1046   heparin injection 5,000 Units  5,000 Units Subcutaneous Q8H Jennelle Human B, NP   5,000 Units at 11/24/21 0612   hydrALAZINE (APRESOLINE) tablet 100 mg  100 mg Oral Q8H  Dahal, Binaya, MD       insulin aspart (novoLOG) injection 0-9 Units  0-9 Units Subcutaneous TID WC Debbe Odea, MD   3 Units at 11/24/21 1219   insulin glargine-yfgn (SEMGLEE) injection 10 Units  10 Units Subcutaneous Daily Dahal, Marlowe Aschoff, MD   10 Units at 11/24/21 1046   MEDLINE mouth rinse  15 mL Mouth Rinse q12n4p Hunsucker, Bonna Gains, MD   15 mL at 11/24/21 1221   metoprolol tartrate (LOPRESSOR) tablet 50 mg  50 mg Oral BID Debbe Odea, MD   50 mg at 11/24/21 1047   multivitamin with minerals tablet 1 tablet  1 tablet Oral Daily Hunsucker, Bonna Gains, MD   1 tablet at 11/24/21 1046     Discharge Medications: Please see discharge summary for a list of discharge medications.  Relevant Imaging Results:  Relevant Lab Results:   Additional Information SS#: [id number]  Geralynn Ochs, LCSW

## 2021-11-24 NOTE — Progress Notes (Signed)
Inpatient Rehab Admissions Coordinator:   Met with patient at bedside.  He does not recall meeting me yesterday.  When I reminded him that we were working to confirm dispo for him he asked if I could call his family.  Left message for son to discuss.    Shann Medal, PT, DPT Admissions Coordinator [phone] 11/24/21  1:25 PM

## 2021-11-24 NOTE — Progress Notes (Signed)
PROGRESS NOTE  Manuel Kramer  DOB: 01-Nov-1955  PCP: Vonna Drafts, FNP OYD:[id number]  DOA: 11/14/2021  LOS: 10 days  Hospital Day: 49  Chief Complaint  Patient presents with   Altered Mental Status   Brief narrative: Manuel Kramer is a 66 y.o. male with PMH significant for DM2, HTN, chronic diarrhea who lives at home alone. Patient was brought to the ED on 11/14/2021 by EMS for altered mental status. His neighbors noted his medical alarm going at 1:30 AM and hence EMS and police were involved.  EMS found patient agitated and combative with nonverbal status and nonpurposeful movements.  Patient was brought to the ED.  In the ED he was noted to have left-sided gaze preference.  His GCS was low and needed emergent intubation.  He was bolused with Keppra, placed on nitro infusion, later on Cleviprex for hypertension. Admitted to ICU 1/18, MRI brain showed acute infarcts within the right frontal operculum, right internal capsule, right frontoparietal periventricular white matter and posterior right frontal lobe.  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  Mild to moderate generalized cerebral atrophy and mild cerebellar atrophy. 1/19, extubated 1/21, transfer out of ICU to [facility]  Subjective: Patient was seen and examined this morning.  Lying down in bed.  More awake than yesterday.  Knows he is in the hospital.  Not oriented to time but able to read from the board.  Able to follow motor commands.  Pending CIR at this time.  Assessment/Plan: Multifocal lacunar infarcts -Likely due to dehydration in the setting of small vessel disease and chronic hypertensive microangiopathy -Echo with bubble showed EF of 60 to 65%, no wall motion abnormality. -Neurology consultation was obtained.   -Stroke work-up completed.   Echo with EF 60 to 65%, severe LVH, no atrial level shunt. -Carotid artery duplex without significant stenosis -A1c 9.9, LDL 88 -Neurology recommended aspirin 81 mg daily and Lipitor 40 mg at bedtime. -PT/OT/ST eval obtained.  CIR recommended.  Uncontrolled hypertension -Patient probably had chronic uncontrolled hypertension leading to hypertensive microangiopathy, severe LVH -Currently blood pressure is mostly 150s with metoprolol, clonidine, amlodipine and hydralazine.  Blood pressure still remains elevated to 150s.  Increase hydralazine from 50 to 100 mg 3 times daily. -Continue to monitor blood pressure  Dysphagia -Dysphagia 3 diet per speech.  Acute metabolic encephalopathy -Probably due to stroke self.  In the ED, patient was febrile as well.  Further work-up did not suggest any neurological infection.  Currently not on any antibiotics or antiviral.   -Mental status gradually improving.  Uncontrolled type 2 diabetes mellitus with hyperglycemia -A1c 9.9 on 11/14/2021 -Home meds include Lantus 30 to 64 units at bedtime, empagliflozin-linagliptin daily -Currently on Semglee 10 units daily and sliding scale insulin.  Blood sugar level improving but not well controlled.  I would resume home oral med today. Recent Labs  Lab 11/23/21 1135 11/23/21 1603 11/23/21 1732 11/23/21 2120 11/24/21 0632  GLUCAP 254* 225* 266* 180* 161*   AKI on CKD 3B -Baseline creatinine 1.4 from 2021.  This is present, creatinine increased to peak at 2.06.  Improving gradually, 1.62 today. Recent Labs    11/14/21 1219 11/15/21 6720 11/16/21 0721 11/17/21 0457 11/18/21 0333 11/19/21 0235 11/20/21 0310 11/21/21 0339 11/23/21 0329 11/24/21 0331  BUN 14 17 24* 24* 29* 29* 30* 29* 25* 26*  CREATININE 1.83* 2.05* 2.06* 1.83* 1.81* 1.62* 1.55* 1.45*  1.73* 1.62*   Mobility: PT eval obtained. Living condition: Was at home Goals of care:   Code Status: Full Code  Nutritional status: Body mass index is  23.88 kg/m.  Nutrition Problem: Severe Malnutrition Etiology: chronic illness Signs/Symptoms: severe muscle depletion, severe fat depletion Diet:  Diet Order             DIET DYS 3 Room service appropriate? No; Fluid consistency: Thin  Diet effective now                  DVT prophylaxis:  heparin injection 5,000 Units Start: 11/16/21 1400 SCDs Start: 11/14/21 0830   Antimicrobials: None Fluid: None Consultants: Neurology Family Communication: None at bedside  Status is: Inpatient  Continue in-hospital care because: Pending CIR Level of care: Med-Surg   Dispo: The patient is from: Home              Anticipated d/c is to: CIR              Patient currently is not medically stable to d/c.   Difficult to place patient No     Infusions:    Scheduled Meds:  amLODipine  10 mg Oral Daily   aspirin  81 mg Oral Daily   atorvastatin  40 mg Oral Daily   bisacodyl  10 mg Rectal Once   chlorhexidine gluconate (MEDLINE KIT)  15 mL Mouth Rinse BID   cloNIDine  0.2 mg Transdermal Weekly   cloNIDine  0.1 mg Oral TID   Empagliflozin-linaGLIPtin   Oral Daily   feeding supplement (GLUCERNA SHAKE)  237 mL Oral TID BM   heparin injection (subcutaneous)  5,000 Units Subcutaneous Q8H   hydrALAZINE  100 mg Oral Q8H   insulin aspart  0-9 Units Subcutaneous TID WC   insulin glargine-yfgn  10 Units Subcutaneous Daily   mouth rinse  15 mL Mouth Rinse q12n4p   metoprolol tartrate  50 mg Oral BID   multivitamin with minerals  1 tablet Oral Daily    PRN meds: dextrose   Antimicrobials: Anti-infectives (From admission, onward)    Start     Dose/Rate Route Frequency Ordered Stop   11/15/21 1000  vancomycin (VANCOREADY) IVPB 750 mg/150 mL  Status:  Discontinued        750 mg 150 mL/hr over 60 Minutes Intravenous Every 24 hours 11/14/21 0556 11/16/21 1045   11/14/21 2200  ceFEPIme (MAXIPIME) 2 g in sodium chloride 0.9 % 100 mL IVPB  Status:  Discontinued        2 g 200 mL/hr over  30 Minutes Intravenous Every 12 hours 11/14/21 0556 11/16/21 1045   11/14/21 1945  acyclovir (ZOVIRAX) 600 mg in dextrose 5 % 100 mL IVPB  Status:  Discontinued        10 mg/kg  60 kg 112 mL/hr over 60 Minutes Intravenous Every 12 hours 11/14/21 1847 11/18/21 0927   11/14/21 0530  ceFEPIme (MAXIPIME) 2 g in sodium chloride 0.9 % 100 mL IVPB        2 g 200 mL/hr over 30 Minutes Intravenous  Once 11/14/21 0523 11/14/21 0633   11/14/21 0530  metroNIDAZOLE (FLAGYL) IVPB 500 mg        500 mg 100 mL/hr over 60 Minutes Intravenous  Once 11/14/21 0523 11/14/21 0713   11/14/21 0530  vancomycin (VANCOCIN) IVPB 1000 mg/200 mL premix        1,000 mg 200 mL/hr over 60 Minutes Intravenous  Once 11/14/21 0523 11/14/21 2355  Objective: Vitals:   11/24/21 0402 11/24/21 0812  BP: (!) 149/77 (!) 152/76  Pulse: 73 71  Resp: 17 18  Temp: 98.3 F (36.8 C) (!) 97.4 F (36.3 C)  SpO2: 95% 99%    Intake/Output Summary (Last 24 hours) at 11/24/2021 1123 Last data filed at 11/23/2021 2158 Gross per 24 hour  Intake 236 ml  Output 125 ml  Net 111 ml    Filed Weights   11/16/21 0500 11/17/21 0500 11/20/21 0400  Weight: 61 kg 61 kg 67.1 kg   Weight change:  Body mass index is 23.88 kg/m.   Physical Exam: General exam: Pleasant elderly African-American male.  Not in physical distress Skin: No rashes, lesions or ulcers. HEENT: Atraumatic, normocephalic, no obvious bleeding Lungs: Clear to auscultation bilaterally CVS: Regular rate and rhythm, no murmur GI/Abd soft, nontender, nondistended, present CNS: Alert, awake, oriented to place and person Psychiatry: Mood appropriate Extremities: No pedal edema, no calf tenderness  Data Review: I have personally reviewed the laboratory data and studies available.  F/u labs ordered Unresulted Labs (From admission, onward)     Start     Ordered   Unscheduled  CBC with Differential/Platelet  Tomorrow morning,   R       Question:  Specimen  collection method  Answer:  Lab=Lab collect   11/24/21 1123   Unscheduled  Basic metabolic panel  Tomorrow morning,   R       Question:  Specimen collection method  Answer:  Lab=Lab collect   11/24/21 1123            Signed, Terrilee Croak, MD [facility] 11/24/2021

## 2021-11-24 NOTE — Progress Notes (Signed)
Physical Therapy Treatment Patient Details Name: Manuel Kramer MRN: [id number] DOB: 07/13/1956 Today's Date: 11/24/2021   History of Present Illness 66 y/o male presented to ED on 11/14/21 after being found in his home altered and combative. Unresponsive in ED. EEG suggestive of severe encephalopathy. 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 Intubated 1/16-1/19. PMH: HTN, DM    PT Comments    Pt was able to walk further than the previous day and small improvements in memory. Patient is able to recall some things from previous sessions and previous days. He continues to have trouble with short term memory, being unable to recall things from earlier in the session, and is not oriented to time or situation. Pt's endurance seems to be limited by feelings of dizziness and fatigue. Patient still requires min to mod assist with sit to stands. treatment session patient showed deficits in strength, endurance, activity tolerance, and cognition. Patient has made progress over the past couple of sessions and is progressing towards his goals. Recommending therapy services at acute inpatient rehab to address the previously stated deficits. Will continue to follow acutely to maximize functional mobility, independence and safety.    Recommendations for follow up therapy are one component of a multi-disciplinary discharge planning process, led by the attending physician.  Recommendations may be updated based on patient status, additional functional criteria and insurance authorization.  Follow Up Recommendations  Acute inpatient rehab (3hours/day)     Assistance Recommended at Discharge Frequent or constant Supervision/Assistance  Patient can return home with the following Assistance with feeding;Assistance with cooking/housework;Direct supervision/assist for  medications management;Direct supervision/assist for financial management;Assist for transportation;Help with stairs or ramp for entrance;A lot of help with bathing/dressing/bathroom;A lot of help with walking and/or transfers   Equipment Recommendations  Other (comment) (TBD)    Recommendations for Other Services       Precautions / Restrictions Precautions Precautions: Fall Restrictions Weight Bearing Restrictions: No     Mobility  Bed Mobility Overal bed mobility: Needs Assistance Bed Mobility: Supine to Sit, Sit to Supine, Rolling Rolling: Supervision   Supine to sit: Supervision Sit to supine: Supervision   General bed mobility comments: Patient requires simple cues to get to EOB.    Transfers Overall transfer level: Needs assistance Equipment used: Rolling walker (2 wheels) Transfers: Sit to/from Stand Sit to Stand: Mod assist           General transfer comment: Patient had some dizziness with sitting and standing up.    Ambulation/Gait Ambulation/Gait assistance: Min assist Gait Distance (Feet): 40 Feet Assistive device: Rolling walker (2 wheels) Gait Pattern/deviations: Step-through pattern, Decreased stride length Gait velocity: Decreased         Stairs             Wheelchair Mobility    Modified Rankin (Stroke Patients Only) Modified Rankin (Stroke Patients Only) Pre-Morbid Rankin Score: No symptoms Modified Rankin: Moderately severe disability     Balance Overall balance assessment: Needs assistance Sitting-balance support: Feet unsupported Sitting balance-Leahy Scale: Fair Sitting balance - Comments: Sitting balance was not formally tested but patient was able to sit at EOB without feet support of UE support.   Standing balance support: Bilateral upper extremity supported Standing balance-Leahy Scale: Poor  Cognition Arousal/Alertness: Awake/alert Behavior During Therapy: WFL for tasks  assessed/performed Overall Cognitive Status: Impaired/Different from baseline Area of Impairment: Attention, Memory, Following commands, Safety/judgement, Awareness, Problem solving, Orientation                 Orientation Level: Situation, Disoriented to, Time Current Attention Level: Selective, Divided Memory: Decreased short-term memory Following Commands: Follows one step commands consistently, Follows one step commands with increased time Safety/Judgement: Decreased awareness of safety, Decreased awareness of deficits Awareness: Emergent Problem Solving: Slow processing, Decreased initiation, Difficulty sequencing, Requires verbal cues, Requires tactile cues General Comments: Patient is unable to recall why he is in the hospital or what year it is. After being told what year it is he was unable to recall later in the session. He was unable to recall what excercises were just performed previously in the session.        Exercises Other Exercises Other Exercises: Ankle pumps Other Exercises: Seated knee flex/ext Other Exercises: seated marches    General Comments        Pertinent Vitals/Pain Pain Assessment Pain Assessment: Faces Faces Pain Scale: Hurts a little bit Pain Location: Patient reports knee pain with heelslides Pain Descriptors / Indicators: Aching    Home Living                          Prior Function            PT Goals (current goals can now be found in the care plan section)      Frequency    Min 4X/week      PT Plan Current plan remains appropriate    Co-evaluation              AM-PAC PT "6 Clicks" Mobility   Outcome Measure  Help needed turning from your back to your side while in a flat bed without using bedrails?: A Little Help needed moving from lying on your back to sitting on the side of a flat bed without using bedrails?: A Little Help needed moving to and from a bed to a chair (including a wheelchair)?: A  Lot Help needed standing up from a chair using your arms (e.g., wheelchair or bedside chair)?: A Lot Help needed to walk in hospital room?: A Lot Help needed climbing 3-5 steps with a railing? : A Lot 6 Click Score: 14    End of Session Equipment Utilized During Treatment: Gait belt Activity Tolerance: Patient limited by fatigue;Patient tolerated treatment well Patient left: in bed;with call bell/phone within reach;with bed alarm set   PT Visit Diagnosis: Muscle weakness (generalized) (M62.81);Other abnormalities of gait and mobility (R26.89);Other symptoms and signs involving the nervous system (R29.898)     Time:1505  - 1527    Charges:   1PT visit 1 Therapeutic Activity                     Armanda Heritage, SPT    Armanda Heritage 11/24/2021, 4:28 PM

## 2021-11-25 LAB — GLUCOSE, CAPILLARY
Glucose-Capillary: 149 mg/dL — ABNORMAL HIGH (ref 70–99)
Glucose-Capillary: 329 mg/dL — ABNORMAL HIGH (ref 70–99)
Glucose-Capillary: 260 mg/dL — ABNORMAL HIGH (ref 70–99)
Glucose-Capillary: 149 mg/dL — ABNORMAL HIGH (ref 70–99)

## 2021-11-25 LAB — BASIC METABOLIC PANEL
Anion gap: 4 — ABNORMAL LOW (ref 5–15)
BUN: 23 mg/dL (ref 8–23)
CO2: 25 mmol/L (ref 22–32)
Calcium: 8.1 mg/dL — ABNORMAL LOW (ref 8.9–10.3)
Chloride: 110 mmol/L (ref 98–111)
Creatinine, Ser: 1.69 mg/dL — ABNORMAL HIGH (ref 0.61–1.24)
GFR, Estimated: 45 mL/min — ABNORMAL LOW (ref >60)
Glucose, Bld: 128 mg/dL — ABNORMAL HIGH (ref 70–99)
Potassium: 4.1 mmol/L (ref 3.5–5.1)
Sodium: 139 mmol/L (ref 135–145)

## 2021-11-25 LAB — CBC WITH DIFFERENTIAL/PLATELET
Abs Immature Granulocytes: 0.29 10*3/uL — ABNORMAL HIGH (ref 0.00–0.07)
Basophils Absolute: 0 10*3/uL (ref 0.0–0.1)
Basophils Relative: 0 %
Eosinophils Absolute: 0.3 10*3/uL (ref 0.0–0.5)
Eosinophils Relative: 2 %
HCT: 24.3 % — ABNORMAL LOW (ref 39.0–52.0)
Hemoglobin: 7.8 g/dL — ABNORMAL LOW (ref 13.0–17.0)
Immature Granulocytes: 2 %
Lymphocytes Relative: 17 %
Lymphs Abs: 2 10*3/uL (ref 0.7–4.0)
MCH: 32.2 pg (ref 26.0–34.0)
MCHC: 32.1 g/dL (ref 30.0–36.0)
MCV: 100.4 fL — ABNORMAL HIGH (ref 80.0–100.0)
Monocytes Absolute: 1.1 10*3/uL — ABNORMAL HIGH (ref 0.1–1.0)
Monocytes Relative: 9 %
Neutro Abs: 8.4 10*3/uL — ABNORMAL HIGH (ref 1.7–7.7)
Neutrophils Relative %: 70 %
Platelets: 348 10*3/uL (ref 150–400)
RBC: 2.42 MIL/uL — ABNORMAL LOW (ref 4.22–5.81)
RDW: 15.3 % (ref 11.5–15.5)
WBC: 12.1 10*3/uL — ABNORMAL HIGH (ref 4.0–10.5)
nRBC: 0.3 % — ABNORMAL HIGH (ref 0.0–0.2)

## 2021-11-25 MED ORDER — INSULIN GLARGINE-YFGN 100 UNIT/ML ~~LOC~~ SOLN
15.0000 [IU] | Freq: Every day | SUBCUTANEOUS | Status: DC
  Administered 2021-11-26: 15 [IU] via SUBCUTANEOUS
  Filled 2021-11-25 (×2): qty 0.15

## 2021-11-25 NOTE — Progress Notes (Signed)
PROGRESS NOTE  Manuel Kramer  DOB: August 02, 1956  PCP: Diamantina Providence, FNP YHC:[id number]  DOA: 11/14/2021  LOS: 11 days  Hospital Day: 12  Chief Complaint  Patient presents with   Altered Mental Status   Brief narrative: Manuel Kramer is a 66 y.o. male with PMH significant for DM2, HTN, chronic diarrhea who lives at home alone. Patient was brought to the ED on 11/14/2021 by EMS for altered mental status. His neighbors noted his medical alarm going at 1:30 AM and hence EMS and police were involved.  EMS found patient agitated and combative with nonverbal status and nonpurposeful movements.  Patient was brought to the ED.  In the ED he was noted to have left-sided gaze preference.  His GCS was low and needed emergent intubation.  He was bolused with Keppra, placed on nitro infusion, later on Cleviprex for hypertension. Admitted to ICU 1/18, MRI brain showed acute infarcts within the right frontal operculum, right internal capsule, right frontoparietal periventricular white matter and posterior right frontal lobe.  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  Mild to moderate generalized cerebral atrophy and mild cerebellar atrophy. 1/19, extubated 1/21, transfer out of ICU to [facility]  Subjective: Patient was seen and examined this morning.  Lying on bed.  Alert, awake, oriented to place only.  Not restless or agitated.  Pending CIR at this time.  Assessment/Plan: Multifocal lacunar infarcts -Likely due to dehydration in the setting of small vessel disease and chronic hypertensive microangiopathy -Echo with bubble showed EF of 60 to 65%, no wall motion abnormality. -Neurology consultation was obtained.   -Stroke work-up completed.  Echo with EF 60 to 65%, severe LVH, no atrial level shunt. -Carotid artery duplex  without significant stenosis -A1c 9.9, LDL 88 -Neurology recommended aspirin 81 mg daily and Lipitor 40 mg at bedtime. -PT/OT/ST eval obtained.  CIR recommended.  Uncontrolled hypertension -Patient probably had chronic uncontrolled hypertension leading to hypertensive microangiopathy, severe LVH -Blood pressure medicines were last adjusted yesterday.  Currently on metoprolol, clonidine, amlodipine and hydralazine.  Blood pressure is over 170 this morning. -Continue to monitor for next 24 hours.  May need further adjustment today if remains elevated  Dysphagia -Dysphagia 3 diet per speech.  Acute metabolic encephalopathy -Probably due to stroke self.  In the ED, patient was febrile as well.  Further work-up did not suggest any neurological infection.  Currently not on any antibiotics or antiviral.   -Mental status gradually improving.  Uncontrolled type 2 diabetes mellitus with hyperglycemia -A1c 9.9 on 11/14/2021 -Home meds include Lantus 30 to 64 units at bedtime, empagliflozin-linagliptin daily -Currently continued on empagliflozin/linagliptin.  Also on Semglee 10 units daily and sliding scale insulin.  Blood sugar level elevated to over 300 this morning.  I will increase Semglee to 15 units. Recent Labs  Lab 11/24/21 1129 11/24/21 1628 11/24/21 2116 11/25/21 0600 11/25/21 1140  GLUCAP 249* 196* 143* 149* 329*   AKI on CKD 3B -Baseline creatinine 1.4 from 2021.  This is present, creatinine increased to peak at 2.06.  Improving gradually, 1.69 today. Recent Labs    11/15/21 0632 11/16/21 0721 11/17/21 0457 11/18/21 0333 11/19/21 0235 11/20/21 0310 11/21/21 0339 11/23/21 0329 11/24/21 0331 11/25/21 0306  BUN 17 24* 24* 29* 29* 30* 29* 25* 26* 23  CREATININE 2.05* 2.06* 1.83* 1.81* 1.62* 1.55* 1.45* 1.73* 1.62* 1.69*   Mobility: PT eval obtained. Living condition:  Was at home Goals of care:   Code Status: Full Code  Nutritional status: Body mass index is 23.88 kg/m.   Nutrition Problem: Severe Malnutrition Etiology: chronic illness Signs/Symptoms: severe muscle depletion, severe fat depletion Diet:  Diet Order             DIET DYS 3 Room service appropriate? Yes with Assist; Fluid consistency: Thin  Diet effective now                  DVT prophylaxis:  heparin injection 5,000 Units Start: 11/16/21 1400 SCDs Start: 11/14/21 0830   Antimicrobials: None Fluid: None Consultants: Neurology Family Communication: None at bedside  Status is: Inpatient  Continue in-hospital care because: Pending CIR Level of care: Med-Surg   Dispo: The patient is from: Home              Anticipated d/c is to: CIR              Patient currently is not medically stable to d/c.   Difficult to place patient No     Infusions:    Scheduled Meds:  amLODipine  10 mg Oral Daily   aspirin  81 mg Oral Daily   atorvastatin  40 mg Oral Daily   bisacodyl  10 mg Rectal Once   cloNIDine  0.2 mg Transdermal Weekly   empagliflozin  10 mg Oral Daily   And   linagliptin  5 mg Oral Daily   feeding supplement (GLUCERNA SHAKE)  237 mL Oral TID BM   heparin injection (subcutaneous)  5,000 Units Subcutaneous Q8H   hydrALAZINE  100 mg Oral Q8H   insulin aspart  0-9 Units Subcutaneous TID WC   [START ON 11/26/2021] insulin glargine-yfgn  15 Units Subcutaneous Daily   mouth rinse  15 mL Mouth Rinse q12n4p   metoprolol tartrate  50 mg Oral BID   multivitamin with minerals  1 tablet Oral Daily    PRN meds: dextrose   Antimicrobials: Anti-infectives (From admission, onward)    Start     Dose/Rate Route Frequency Ordered Stop   11/15/21 1000  vancomycin (VANCOREADY) IVPB 750 mg/150 mL  Status:  Discontinued        750 mg 150 mL/hr over 60 Minutes Intravenous Every 24 hours 11/14/21 0556 11/16/21 1045   11/14/21 2200  ceFEPIme (MAXIPIME) 2 g in sodium chloride 0.9 % 100 mL IVPB  Status:  Discontinued        2 g 200 mL/hr over 30 Minutes Intravenous Every 12 hours  11/14/21 0556 11/16/21 1045   11/14/21 1945  acyclovir (ZOVIRAX) 600 mg in dextrose 5 % 100 mL IVPB  Status:  Discontinued        10 mg/kg  60 kg 112 mL/hr over 60 Minutes Intravenous Every 12 hours 11/14/21 1847 11/18/21 0927   11/14/21 0530  ceFEPIme (MAXIPIME) 2 g in sodium chloride 0.9 % 100 mL IVPB        2 g 200 mL/hr over 30 Minutes Intravenous  Once 11/14/21 0523 11/14/21 0633   11/14/21 0530  metroNIDAZOLE (FLAGYL) IVPB 500 mg        500 mg 100 mL/hr over 60 Minutes Intravenous  Once 11/14/21 0523 11/14/21 0713   11/14/21 0530  vancomycin (VANCOCIN) IVPB 1000 mg/200 mL premix        1,000 mg 200 mL/hr over 60 Minutes Intravenous  Once 11/14/21 0523 11/14/21 0811       Objective: Vitals:   11/25/21 0807 11/25/21 1131  BP: (!) 171/96 (!) 179/97  Pulse: 72 75  Resp: 16 16  Temp: 98.5 F (36.9 C) 98.3 F (36.8 C)  SpO2: 98% 100%    Intake/Output Summary (Last 24 hours) at 11/25/2021 1318 Last data filed at 11/24/2021 1400 Gross per 24 hour  Intake --  Output 150 ml  Net -150 ml    Filed Weights   11/16/21 0500 11/17/21 0500 11/20/21 0400  Weight: 61 kg 61 kg 67.1 kg   Weight change:  Body mass index is 23.88 kg/m.   Physical Exam: General exam: Pleasant elderly African-American male.  Not in physical distress Skin: No rashes, lesions or ulcers. HEENT: Atraumatic, normocephalic, no obvious bleeding Lungs: Clear to auscultation bilaterally CVS: Regular rate and rhythm, no murmur GI/Abd soft, nontender, nondistended, present CNS: Alert, awake, oriented to place and person Psychiatry: Mood appropriate Extremities: No pedal edema, no calf tenderness  Data Review: I have personally reviewed the laboratory data and studies available.  F/u labs ordered Unresulted Labs (From admission, onward)    None       Signed, Lorin Glass, MD [facility] 11/25/2021

## 2021-11-25 NOTE — Progress Notes (Signed)
Speech Language Pathology Treatment: Cognitive-Linquistic  Patient Details Name: Manuel Kramer MRN: [id number] DOB: 26-Jan-1956 Today's Date: 11/25/2021 Time: 1350-1410 SLP Time Calculation (min) (ACUTE ONLY): 20 min  Assessment / Plan / Recommendation Clinical Impression  Pt seen for communication and cognitive therapy. Today he was able to express himself verbally and fluently without dysnomia or decreased semantics. Cognitively, his working memory was poor and exhibited difficulty retrieving information at 2 min delay. He was not utilizing environmental cues to orient to date after therapist wrote date on white board. Therapist gave pt stapled paper and pencil and wrote information at top re: spatial and situational orientation. Encouraged pt to use paper to assist in storing and retrieving relevant information (primarily disposition). Continue treatment.    HPI HPI: Pt is a 66 year old man admitted after EMS found him unresponsive and hypotensive; Pt was intubated on 11/14/2021-11/17/2021. MRI scan revealed several small infarcts in right hemisphere.      SLP Plan  Continue with current plan of care      Recommendations for follow up therapy are one component of a multi-disciplinary discharge planning process, led by the attending physician.  Recommendations may be updated based on patient status, additional functional criteria and insurance authorization.    Recommendations                   Oral Care Recommendations: Oral care BID Follow Up Recommendations: Skilled nursing-short term rehab (<3 hours/day) Assistance recommended at discharge: Frequent or constant Supervision/Assistance SLP Visit Diagnosis: Dysphagia, unspecified (R13.10) Plan: Continue with current plan of care           Manuel Kramer  11/25/2021, 2:13 PM

## 2021-11-25 NOTE — Progress Notes (Signed)
Inpatient Rehab Admissions Coordinator:   Left voicemail for son to discuss rehab.  If unable to confirm support, pt will likely need SNF placement.    Estill Dooms, PT, DPT Admissions Coordinator [phone] 11/25/21  2:12 PM

## 2021-11-26 LAB — GLUCOSE, CAPILLARY
Glucose-Capillary: 232 mg/dL — ABNORMAL HIGH (ref 70–99)
Glucose-Capillary: 211 mg/dL — ABNORMAL HIGH (ref 70–99)
Glucose-Capillary: 215 mg/dL — ABNORMAL HIGH (ref 70–99)
Glucose-Capillary: 211 mg/dL — ABNORMAL HIGH (ref 70–99)

## 2021-11-26 MED ORDER — METOPROLOL TARTRATE 50 MG PO TABS: 75.0000 mg | ORAL_TABLET | Freq: Two times a day (BID) | ORAL | Status: DC

## 2021-11-26 MED ORDER — METOPROLOL TARTRATE 50 MG PO TABS
50.0000 mg | ORAL_TABLET | Freq: Two times a day (BID) | ORAL | Status: DC
  Administered 2021-11-26 (×2): 50 mg via ORAL
  Filled 2021-11-26 (×2): qty 1

## 2021-11-26 MED ORDER — SPIRONOLACTONE 25 MG PO TABS
25.0000 mg | ORAL_TABLET | Freq: Every day | ORAL | Status: DC
  Administered 2021-11-26: 25 mg via ORAL
  Filled 2021-11-26: qty 1

## 2021-11-26 NOTE — Progress Notes (Addendum)
Patient vomited copious amount of undigested food. Stomach soft ands bowel sounds present. He has been having multiple loose stool throughout the shift. MD made aware. Will continue to monitor.  Addendum:  Dr Rachael Darby RN reports no other symptoms. No complaints of abdominal pain or distension. Pt has no complaints at this time. Continue to monitor

## 2021-11-26 NOTE — Progress Notes (Signed)
PROGRESS NOTE  Manuel Kramer  DOB: 1956/07/27  PCP: Diamantina Providence, FNP MWN:[id number]  DOA: 11/14/2021  LOS: 12 days  Hospital Day: 13  Chief Complaint  Patient presents with   Altered Mental Status   Brief narrative: Manuel Kramer is a 66 y.o. male with PMH significant for DM2, HTN, chronic diarrhea who lives at home alone. Patient was brought to the ED on 11/14/2021 by EMS for altered mental status. His neighbors noted his medical alarm going at 1:30 AM and hence EMS and police were involved.  EMS found patient agitated and combative with nonverbal status and nonpurposeful movements.  Patient was brought to the ED.  In the ED he was noted to have left-sided gaze preference.  His GCS was low and needed emergent intubation.  He was bolused with Keppra, placed on nitro infusion, later on Cleviprex for hypertension. Admitted to ICU 1/18, MRI brain showed acute infarcts within the right frontal operculum, right internal capsule, right frontoparietal periventricular white matter and posterior right frontal lobe.  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  Mild to moderate generalized cerebral atrophy and mild cerebellar atrophy. 1/19, extubated 1/21, transfer out of ICU to [facility]  Subjective: Patient was seen and examined this morning.  Lying on bed.  Not in distress.  Speech clear.  Looks stronger but still oriented to place only. Blood pressure continues to run elevated in 170s. Had few episodes of diarrhea last night.  Assessment/Plan: Multifocal lacunar infarcts -Likely due to dehydration in the setting of small vessel disease and chronic hypertensive microangiopathy -Echo with bubble showed EF of 60 to 65%, no wall motion abnormality. -Neurology consultation was obtained.   -Stroke work-up completed.   Echo with EF 60 to 65%, severe LVH, no atrial level shunt. -Carotid artery duplex without significant stenosis -A1c 9.9, LDL 88 -Neurology recommended aspirin 81 mg daily and Lipitor 40 mg at bedtime. -PT/OT/ST eval obtained.  CIR recommended.  Uncontrolled hypertension -Patient probably had chronic uncontrolled hypertension leading to hypertensive microangiopathy, severe LVH -Blood pressure medicines being adjusted.  Last 24 hours, blood pressure still remains elevated over 170.  Currently on metoprolol, clonidine, amlodipine and hydralazine.  I added Aldactone this morning.  Continue to monitor.  Dysphagia -Dysphagia 3 diet per speech.  Acute metabolic encephalopathy -Probably due to stroke self.  In the ED, patient was febrile as well.  Further work-up did not suggest any neurological infection.  Currently not on any antibiotics or antiviral.   -Mental status gradually improving.  Uncontrolled type 2 diabetes mellitus with hyperglycemia -A1c 9.9 on 11/14/2021 -Home meds include Lantus 30 to 64 units at bedtime, empagliflozin-linagliptin daily -Currently on Semglee and empagliflozin/linagliptin.  Because of persistently elevated blood sugar level, Semglee dose was increased to 15 units from this morning.  Continue to monitor.  May need further adjustment.   Recent Labs  Lab 11/25/21 0600 11/25/21 1140 11/25/21 1643 11/25/21 2143 11/26/21 0610  GLUCAP 149* 329* 260* 149* 232*    AKI on CKD 3B -Baseline creatinine 1.4 from 2021.  This is present, creatinine increased to peak at 2.06.  Improving gradually, 1.69 today Recent Labs    11/15/21 0632 11/16/21 0721 11/17/21 0457 11/18/21 0333 11/19/21 0235 11/20/21 0310 11/21/21 0339 11/23/21 0329 11/24/21 0331 11/25/21 0306  BUN 17 24* 24* 29* 29* 30* 29* 25* 26* 23  CREATININE 2.05* 2.06* 1.83* 1.81* 1.62* 1.55* 1.45* 1.73*  1.62* 1.69*    Diarrhea -Few episodes in last 24 hours.  I stopped Ensure. continue to monitor  diarrhea  Mobility: PT eval obtained. Living condition: Was at home Goals of care:   Code Status: Full Code  Nutritional status: Body mass index is 23.88 kg/m.  Nutrition Problem: Severe Malnutrition Etiology: chronic illness Signs/Symptoms: severe muscle depletion, severe fat depletion Diet:  Diet Order             DIET DYS 3 Room service appropriate? Yes with Assist; Fluid consistency: Thin  Diet effective now                  DVT prophylaxis:  heparin injection 5,000 Units Start: 11/16/21 1400 SCDs Start: 11/14/21 0830   Antimicrobials: None Fluid: None Consultants: Neurology Family Communication: None at bedside  Status is: Inpatient  Continue in-hospital care because: Pending CIR Level of care: Med-Surg   Dispo: The patient is from: Home              Anticipated d/c is to: CIR              Patient currently is not medically stable to d/c.   Difficult to place patient No     Infusions:    Scheduled Meds:  amLODipine  10 mg Oral Daily   aspirin  81 mg Oral Daily   atorvastatin  40 mg Oral Daily   cloNIDine  0.2 mg Transdermal Weekly   empagliflozin  10 mg Oral Daily   And   linagliptin  5 mg Oral Daily   heparin injection (subcutaneous)  5,000 Units Subcutaneous Q8H   hydrALAZINE  100 mg Oral Q8H   insulin aspart  0-9 Units Subcutaneous TID WC   insulin glargine-yfgn  15 Units Subcutaneous Daily   mouth rinse  15 mL Mouth Rinse q12n4p   metoprolol tartrate  50 mg Oral BID   multivitamin with minerals  1 tablet Oral Daily   spironolactone  25 mg Oral Daily    PRN meds: dextrose   Antimicrobials: Anti-infectives (From admission, onward)    Start     Dose/Rate Route Frequency Ordered Stop   11/15/21 1000  vancomycin (VANCOREADY) IVPB 750 mg/150 mL  Status:  Discontinued        750 mg 150 mL/hr over 60 Minutes Intravenous Every 24 hours 11/14/21 0556 11/16/21 1045   11/14/21 2200  ceFEPIme (MAXIPIME) 2 g in sodium chloride 0.9 % 100 mL IVPB   Status:  Discontinued        2 g 200 mL/hr over 30 Minutes Intravenous Every 12 hours 11/14/21 0556 11/16/21 1045   11/14/21 1945  acyclovir (ZOVIRAX) 600 mg in dextrose 5 % 100 mL IVPB  Status:  Discontinued        10 mg/kg  60 kg 112 mL/hr over 60 Minutes Intravenous Every 12 hours 11/14/21 1847 11/18/21 0927   11/14/21 0530  ceFEPIme (MAXIPIME) 2 g in sodium chloride 0.9 % 100 mL IVPB        2 g 200 mL/hr over 30 Minutes Intravenous  Once 11/14/21 0523 11/14/21 0633   11/14/21 0530  metroNIDAZOLE (FLAGYL) IVPB 500 mg        500 mg 100 mL/hr over 60 Minutes Intravenous  Once 11/14/21 0523 11/14/21 0713   11/14/21 0530  vancomycin (VANCOCIN) IVPB 1000 mg/200 mL premix        1,000 mg 200 mL/hr over 60 Minutes Intravenous  Once 11/14/21 0523 11/14/21 [id number]  Objective: Vitals:   11/26/21 0320 11/26/21 0759  BP: (!) 165/85 (!) 177/97  Pulse: 72 72  Resp: 17 18  Temp: 98 F (36.7 C) 98.2 F (36.8 C)  SpO2: 99% 100%   No intake or output data in the 24 hours ending 11/26/21 1002   Filed Weights   11/16/21 0500 11/17/21 0500 11/20/21 0400  Weight: 61 kg 61 kg 67.1 kg   Weight change:  Body mass index is 23.88 kg/m.   Physical Exam: General exam: Pleasant elderly African-American male.  Not in physical distress Skin: No rashes, lesions or ulcers. HEENT: Atraumatic, normocephalic, no obvious bleeding Lungs: Clear to auscultation bilaterally CVS: Regular rate and rhythm, no murmur GI/Abd soft, nontender, nondistended, present CNS: Alert, awake, oriented to place and person Psychiatry: Mood appropriate Extremities: No pedal edema, no calf tenderness  Data Review: I have personally reviewed the laboratory data and studies available.  F/u labs ordered Unresulted Labs (From admission, onward)     Start     Ordered   11/27/21 0500  CBC with Differential/Platelet  Tomorrow morning,   R       Question:  Specimen collection method  Answer:  Lab=Lab collect   11/26/21  0752   11/27/21 [id number]  Basic metabolic panel  Tomorrow morning,   R       Question:  Specimen collection method  Answer:  Lab=Lab collect   11/26/21 [id number]            Signed, Terrilee Croak, MD [facility] 11/26/2021

## 2021-11-27 LAB — CBC WITH DIFFERENTIAL/PLATELET
Abs Immature Granulocytes: 0.18 10*3/uL — ABNORMAL HIGH (ref 0.00–0.07)
Basophils Absolute: 0 10*3/uL (ref 0.0–0.1)
Basophils Relative: 0 %
Eosinophils Absolute: 0.2 10*3/uL (ref 0.0–0.5)
Eosinophils Relative: 2 %
HCT: 27.5 % — ABNORMAL LOW (ref 39.0–52.0)
Hemoglobin: 9.1 g/dL — ABNORMAL LOW (ref 13.0–17.0)
Immature Granulocytes: 2 %
Lymphocytes Relative: 18 %
Lymphs Abs: 2.1 10*3/uL (ref 0.7–4.0)
MCH: 33.2 pg (ref 26.0–34.0)
MCHC: 33.1 g/dL (ref 30.0–36.0)
MCV: 100.4 fL — ABNORMAL HIGH (ref 80.0–100.0)
Monocytes Absolute: 0.9 10*3/uL (ref 0.1–1.0)
Monocytes Relative: 8 %
Neutro Abs: 8.2 10*3/uL — ABNORMAL HIGH (ref 1.7–7.7)
Neutrophils Relative %: 70 %
Platelets: 413 10*3/uL — ABNORMAL HIGH (ref 150–400)
RBC: 2.74 MIL/uL — ABNORMAL LOW (ref 4.22–5.81)
RDW: 15.7 % — ABNORMAL HIGH (ref 11.5–15.5)
WBC: 11.5 10*3/uL — ABNORMAL HIGH (ref 4.0–10.5)
nRBC: 0.6 % — ABNORMAL HIGH (ref 0.0–0.2)

## 2021-11-27 LAB — GLUCOSE, CAPILLARY
Glucose-Capillary: 229 mg/dL — ABNORMAL HIGH (ref 70–99)
Glucose-Capillary: 241 mg/dL — ABNORMAL HIGH (ref 70–99)
Glucose-Capillary: 275 mg/dL — ABNORMAL HIGH (ref 70–99)
Glucose-Capillary: 240 mg/dL — ABNORMAL HIGH (ref 70–99)

## 2021-11-27 LAB — BASIC METABOLIC PANEL
Anion gap: 8 (ref 5–15)
BUN: 21 mg/dL (ref 8–23)
CO2: 27 mmol/L (ref 22–32)
Calcium: 8.6 mg/dL — ABNORMAL LOW (ref 8.9–10.3)
Chloride: 104 mmol/L (ref 98–111)
Creatinine, Ser: 1.63 mg/dL — ABNORMAL HIGH (ref 0.61–1.24)
GFR, Estimated: 47 mL/min — ABNORMAL LOW (ref >60)
Glucose, Bld: 203 mg/dL — ABNORMAL HIGH (ref 70–99)
Potassium: 4.1 mmol/L (ref 3.5–5.1)
Sodium: 139 mmol/L (ref 135–145)

## 2021-11-27 MED ORDER — METOPROLOL TARTRATE 50 MG PO TABS
75.0000 mg | ORAL_TABLET | Freq: Two times a day (BID) | ORAL | Status: DC
  Administered 2021-11-27 (×2): 75 mg via ORAL
  Filled 2021-11-27 (×3): qty 1

## 2021-11-27 MED ORDER — INSULIN GLARGINE-YFGN 100 UNIT/ML ~~LOC~~ SOLN
20.0000 [IU] | Freq: Every day | SUBCUTANEOUS | Status: DC
  Administered 2021-11-27: 20 [IU] via SUBCUTANEOUS
  Filled 2021-11-27 (×2): qty 0.2

## 2021-11-27 MED ORDER — LOPERAMIDE HCL 2 MG PO CAPS
2.0000 mg | ORAL_CAPSULE | Freq: Two times a day (BID) | ORAL | Status: DC
  Administered 2021-11-27 – 2021-11-28 (×3): 2 mg via ORAL
  Filled 2021-11-27 (×3): qty 1

## 2021-11-27 MED ORDER — SPIRONOLACTONE 25 MG PO TABS
50.0000 mg | ORAL_TABLET | Freq: Every day | ORAL | Status: DC
  Administered 2021-11-27 – 2021-11-28 (×2): 50 mg via ORAL
  Filled 2021-11-27 (×2): qty 2

## 2021-11-27 MED ORDER — LOPERAMIDE HCL 2 MG PO CAPS: 2.0000 mg | ORAL_CAPSULE | Freq: Two times a day (BID) | ORAL | Status: DC | PRN

## 2021-11-27 NOTE — Progress Notes (Signed)
Chotiner, MD notified that pt has had 5 type 7 BMs since 1900. Pt denies abdominal pain, abdomen soft. Orders to continue to monitor until C. Diff testing can be approved in the morning.

## 2021-11-27 NOTE — Progress Notes (Signed)
PROGRESS NOTE  Manuel Kramer  DOB: 1956/05/09  PCP: Vonna Drafts, FNP [id number]  DOA: 11/14/2021  LOS: 13 days  Hospital Day: 78  Chief Complaint  Patient presents with   Altered Mental Status   Brief narrative: Manuel Kramer is a 66 y.o. male with PMH significant for DM2, HTN, chronic diarrhea who lives at home alone. Patient was brought to the ED on 11/14/2021 by EMS for altered mental status. His neighbors noted his medical alarm going at 1:30 AM and hence EMS and police were involved.  EMS found patient agitated and combative with nonverbal status and nonpurposeful movements.  Patient was brought to the ED.  In the ED he was noted to have left-sided gaze preference.  His GCS was low and needed emergent intubation.  He was bolused with Keppra, placed on nitro infusion, later on Cleviprex for hypertension. Admitted to ICU 1/18, MRI brain showed acute infarcts within the right frontal operculum, right internal capsule, right frontoparietal periventricular white matter and posterior right frontal lobe.  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  Mild to moderate generalized cerebral atrophy and mild cerebellar atrophy. 1/19, extubated 1/21, transfer out of ICU to [facility]  Subjective: Patient was seen and examined this morning.  Lying on bed.  Not in distress.  Strong voice.  Speech clear but only oriented to place.  Per nursing documentation, patient had 5 episodes of type VII bowel movements last 24 hours but patient is unable to recall any this morning. Blood pressure still remains in the 170s.  Blood sugar running elevated as well.  Medicines adjusted.  Assessment/Plan: Multifocal lacunar infarcts -Likely due to dehydration in the setting of small vessel disease and chronic hypertensive  microangiopathy -Echo with bubble showed EF of 60 to 65%, no wall motion abnormality. -Neurology consultation was obtained.   -Stroke work-up completed.  Echo with EF 60 to 65%, severe LVH, no atrial level shunt. -Carotid artery duplex without significant stenosis -A1c 9.9, LDL 88 -Neurology recommended aspirin 81 mg daily and Lipitor 40 mg at bedtime. -PT/OT/ST eval obtained.  CIR recommended.  Uncontrolled hypertension -Patient probably had chronic uncontrolled hypertension leading to hypertensive microangiopathy, severe LVH -Blood pressure medicines being adjusted.  Last 24 hours, blood pressure still remains elevated over 170 after adding Aldactone.  Currently on metoprolol, clonidine, amlodipine, Aldactone and hydralazine.  This morning, I increase the dose of metoprolol to 75 twice daily and Aldactone to 50 daily.  Continue others.  Continue to monitor.   Dysphagia -Dysphagia 3 diet per speech.  Acute metabolic encephalopathy -Probably due to stroke self.  In the ED, patient was febrile as well.  Further work-up did not suggest any neurological infection.  Currently not on any antibiotics or antiviral.   -Mental status gradually improving.  Uncontrolled type 2 diabetes mellitus with hyperglycemia -A1c 9.9 on 11/14/2021 -Home meds include Lantus 30 to 64 units at bedtime, empagliflozin-linagliptin daily -Currently on Semglee and empagliflozin/linagliptin.  Blood sugar level continues to remain elevated.  Increase Semglee to 20 units this morning.  May need further adjustment. Recent Labs  Lab 11/26/21 0610 11/26/21 1230 11/26/21 1623 11/26/21 2138 11/27/21 0621  GLUCAP 232* 211* 215* 211* 229*   AKI on CKD 3B -Baseline creatinine 1.4 from 2021.  This is present, creatinine increased to peak at 2.06.  Improving gradually, 1.63 today Recent Labs    11/16/21 0721 11/17/21 0457 11/18/21  [id number] 11/19/21 0235 11/20/21 0310 11/21/21 0339 11/23/21 0329 11/24/21 0331  11/25/21 0306 11/27/21 0058  BUN 24* 24* 29* 29* 30* 29* 25* 26* 23 21  CREATININE 2.06* 1.83* 1.81* 1.62* 1.55* 1.45* 1.73* 1.62* 1.69* 1.63*   Diarrhea -Per nursing documentation, more than 5 episodes in last 24 hours.  I stopped Ensure yesterday.  No evidence of infection at this time.  Started on Imodium twice daily scheduled.  Mobility: PT eval obtained. Living condition: Was at home Goals of care:   Code Status: Full Code  Nutritional status: Body mass index is 23.88 kg/m.  Nutrition Problem: Severe Malnutrition Etiology: chronic illness Signs/Symptoms: severe muscle depletion, severe fat depletion Diet:  Diet Order             DIET DYS 3 Room service appropriate? Yes with Assist; Fluid consistency: Thin  Diet effective now                  DVT prophylaxis:  heparin injection 5,000 Units Start: 11/16/21 1400 SCDs Start: 11/14/21 0830   Antimicrobials: None Fluid: None Consultants: Neurology Family Communication: None at bedside  Status is: Inpatient  Continue in-hospital care because: Pending CIR Level of care: Med-Surg   Dispo: The patient is from: Home              Anticipated d/c is to: CIR              Patient currently is not medically stable to d/c.   Difficult to place patient No     Infusions:    Scheduled Meds:  amLODipine  10 mg Oral Daily   aspirin  81 mg Oral Daily   atorvastatin  40 mg Oral Daily   cloNIDine  0.2 mg Transdermal Weekly   empagliflozin  10 mg Oral Daily   And   linagliptin  5 mg Oral Daily   heparin injection (subcutaneous)  5,000 Units Subcutaneous Q8H   hydrALAZINE  100 mg Oral Q8H   insulin aspart  0-9 Units Subcutaneous TID WC   insulin glargine-yfgn  20 Units Subcutaneous Daily   loperamide  2 mg Oral BID   mouth rinse  15 mL Mouth Rinse q12n4p   metoprolol tartrate  75 mg Oral BID   multivitamin with minerals  1 tablet Oral Daily   spironolactone  50 mg Oral Daily    PRN meds: dextrose    Antimicrobials: Anti-infectives (From admission, onward)    Start     Dose/Rate Route Frequency Ordered Stop   11/15/21 1000  vancomycin (VANCOREADY) IVPB 750 mg/150 mL  Status:  Discontinued        750 mg 150 mL/hr over 60 Minutes Intravenous Every 24 hours 11/14/21 0556 11/16/21 1045   11/14/21 2200  ceFEPIme (MAXIPIME) 2 g in sodium chloride 0.9 % 100 mL IVPB  Status:  Discontinued        2 g 200 mL/hr over 30 Minutes Intravenous Every 12 hours 11/14/21 0556 11/16/21 1045   11/14/21 1945  acyclovir (ZOVIRAX) 600 mg in dextrose 5 % 100 mL IVPB  Status:  Discontinued        10 mg/kg  60 kg 112 mL/hr over 60 Minutes Intravenous Every 12 hours 11/14/21 1847 11/18/21 0927   11/14/21 0530  ceFEPIme (MAXIPIME) 2 g in sodium chloride 0.9 % 100 mL IVPB        2 g 200 mL/hr over 30 Minutes Intravenous  Once 11/14/21 0523 11/14/21 0633   11/14/21 0530  metroNIDAZOLE (  FLAGYL) IVPB 500 mg        500 mg 100 mL/hr over 60 Minutes Intravenous  Once 11/14/21 0523 11/14/21 0713   11/14/21 0530  vancomycin (VANCOCIN) IVPB 1000 mg/200 mL premix        1,000 mg 200 mL/hr over 60 Minutes Intravenous  Once 11/14/21 0523 11/14/21 0811       Objective: Vitals:   11/27/21 0333 11/27/21 0830  BP: (!) 176/93 (!) 169/86  Pulse: 77 78  Resp: 17 20  Temp: 97.8 F (36.6 C) 98.2 F (36.8 C)  SpO2: 100% (!) 2%    Intake/Output Summary (Last 24 hours) at 11/27/2021 1024 Last data filed at 11/27/2021 0836 Gross per 24 hour  Intake 941 ml  Output 650 ml  Net 291 ml    Filed Weights   11/16/21 0500 11/17/21 0500 11/20/21 0400  Weight: 61 kg 61 kg 67.1 kg   Weight change:  Body mass index is 23.88 kg/m.   Physical Exam: General exam: Pleasant elderly African-American male.  Not in physical distress. Skin: No rashes, lesions or ulcers. HEENT: Atraumatic, normocephalic, no obvious bleeding Lungs: Clear to auscultation bilaterally CVS: Regular rate and rhythm, no murmur GI/Abd soft,  nontender, nondistended, present CNS: Alert, awake, oriented to place and person Psychiatry: Mood appropriate Extremities: No pedal edema, no calf tenderness  Data Review: I have personally reviewed the laboratory data and studies available.  F/u labs ordered Unresulted Labs (From admission, onward)    None       Signed, Terrilee Croak, MD [facility] 11/27/2021

## 2021-11-28 ENCOUNTER — Other Ambulatory Visit: Payer: Self-pay

## 2021-11-28 LAB — GLUCOSE, CAPILLARY
Glucose-Capillary: 200 mg/dL — ABNORMAL HIGH (ref 70–99)
Glucose-Capillary: 265 mg/dL — ABNORMAL HIGH (ref 70–99)
Glucose-Capillary: 182 mg/dL — ABNORMAL HIGH (ref 70–99)
Glucose-Capillary: 182 mg/dL — ABNORMAL HIGH (ref 70–99)

## 2021-11-28 MED ORDER — INSULIN GLARGINE-YFGN 100 UNIT/ML ~~LOC~~ SOLN
30.0000 [IU] | Freq: Every day | SUBCUTANEOUS | Status: DC
  Administered 2021-11-28 – 2021-12-01 (×4): 30 [IU] via SUBCUTANEOUS
  Filled 2021-11-28 (×4): qty 0.3

## 2021-11-28 MED ORDER — LOPERAMIDE HCL 2 MG PO CAPS
2.0000 mg | ORAL_CAPSULE | Freq: Four times a day (QID) | ORAL | Status: AC
  Administered 2021-11-28 – 2021-11-29 (×3): 2 mg via ORAL
  Filled 2021-11-28 (×3): qty 1

## 2021-11-28 MED ORDER — LIVING WELL WITH DIABETES BOOK
Freq: Once | Status: AC
  Administered 2021-11-28: 1
  Filled 2021-11-28: qty 1

## 2021-11-28 MED ORDER — CARVEDILOL 6.25 MG PO TABS
6.2500 mg | ORAL_TABLET | Freq: Two times a day (BID) | ORAL | Status: DC
  Administered 2021-11-28 – 2021-12-02 (×10): 6.25 mg via ORAL
  Filled 2021-11-28 (×10): qty 1

## 2021-11-28 MED ORDER — CLONIDINE HCL 0.3 MG/24HR TD PTWK
0.3000 mg | MEDICATED_PATCH | TRANSDERMAL | Status: DC
  Administered 2021-11-29: 0.3 mg via TRANSDERMAL
  Filled 2021-11-28: qty 1

## 2021-11-28 MED ORDER — LISINOPRIL 10 MG PO TABS
10.0000 mg | ORAL_TABLET | Freq: Every day | ORAL | Status: DC
  Administered 2021-11-28 – 2021-12-02 (×5): 10 mg via ORAL
  Filled 2021-11-28 (×5): qty 1

## 2021-11-28 NOTE — TOC Initial Note (Signed)
Transition of Care [facility]) - Initial/Assessment Note    Patient Details  Name: Manuel Kramer MRN: [id number] Date of Birth: [birth year]  Transition of Care [facility]) CM/SW Contact:    Kermit Balo, RN Phone Number: 11/28/2021, 1:55 PM  Clinical Narrative:                 CM called patients son this am and provide him the bed offers in [facility]. Pts son prefers a SNF in [facility] as this is where most of the family lives. CM extended the bed search to [facility].  CM has called and left information on the bed offers in [facility] for the patients son. Will need insurance auth once bed selected.  TOC following.  Expected Discharge Plan: Skilled Nursing Facility Barriers to Discharge: Insurance Authorization   Patient Goals and CMS Choice   CMS Medicare.gov Compare Post Acute Care list provided to:: Patient Represenative (must comment) Choice offered to / list presented to : Adult Children  Expected Discharge Plan and Services Expected Discharge Plan: Skilled Nursing Facility In-house Referral: Clinical Social Work Discharge Planning Services: CM Consult Post Acute Care Choice: Skilled Nursing Facility Living arrangements for the past 2 months: Single Family Home                                      Prior Living Arrangements/Services Living arrangements for the past 2 months: Single Family Home Lives with:: Self Patient language and need for interpreter reviewed:: Yes Do you feel safe going back to the place where you live?: Yes      Need for Family Participation in Patient Care: Yes (Comment) Care giver support system in place?: No (comment)   Criminal Activity/Legal Involvement Pertinent to Current Situation/Hospitalization: No - Comment as needed  Activities of Daily Living Home Assistive Devices/Equipment: None ADL Screening (condition at time of admission) Patient's cognitive ability adequate to safely complete daily activities?: No Is  the patient deaf or have difficulty hearing?: No Does the patient have difficulty seeing, even when wearing glasses/contacts?: No Does the patient have difficulty concentrating, remembering, or making decisions?: Yes Patient able to express need for assistance with ADLs?: Yes Does the patient have difficulty dressing or bathing?: No Independently performs ADLs?: Yes (appropriate for developmental age) Does the patient have difficulty walking or climbing stairs?: No Weakness of Legs: None Weakness of Arms/Hands: None  Permission Sought/Granted                  Emotional Assessment Appearance:: Appears stated age Attitude/Demeanor/Rapport: Engaged Affect (typically observed): Accepting Orientation: : Oriented to Self, Oriented to Place, Oriented to  Time, Oriented to Situation   Psych Involvement: No (comment)  Admission diagnosis:  Hyperglycemia [R73.9] Hypertensive emergency without congestive heart failure [I16.1] Acute encephalopathy [G93.40] AKI (acute kidney injury) (HCC) [N17.9] Sepsis with encephalopathy and septic shock, due to unspecified organism (HCC) [A41.9, R65.21, G93.40] Patient Active Problem List   Diagnosis Date Noted   Cerebral embolism with cerebral infarction 11/17/2021   Protein-calorie malnutrition, severe 11/16/2021   Acute encephalopathy 11/14/2021   AKI (acute kidney injury) (HCC)    Hyperglycemia    Hypertensive emergency without congestive heart failure    Sepsis (HCC)    Hyperlipidemia 05/22/2016   Diabetic neuropathy (HCC) 05/22/2016   Other specified diabetes mellitus without complications (HCC) 10/02/2014   Essential hypertension 10/02/2014   Smoking 10/02/2014   Family  history of prostate cancer 10/02/2014   Renal insufficiency 10/02/2014   PCP:  Diamantina Providence, FNP Pharmacy:   [facility] [address], [address] [place] [address] Phone: [phone] Fax:  [phone]  [facility] [place], [place] - [address] [place] [address] Phone: [phone] Fax: [phone]  [id number] - [place], [place] - [address] AT [facility] [address] [place] [address] Phone: [phone] Fax: [phone]     Social Determinants of Health (SDOH) Interventions    Readmission Risk Interventions No flowsheet data found.

## 2021-11-28 NOTE — Progress Notes (Signed)
Speech Language Pathology Treatment: Cognitive-Linquistic  Patient Details Name: Manuel Kramer MRN: [id number] DOB: 22-Mar-1956 Today's Date: 11/28/2021 Time: 8032-1224 SLP Time Calculation (min) (ACUTE ONLY): 24 min  Assessment / Plan / Recommendation Clinical Impression  [name] was participatory and polite. He has very limited short-term recall, and long-term recall is impacted as well, with noted difficulty providing timeline of his life. He mentions writing a book or two, living in [place] and [place], but unable to clarify sequence or length of time. He did demonstrate improved initiation to search environment for answers to questions re: orientation, and was oriented to month/year and that it was the end of the month. He required max verbal/written cues to recall menu items that he had ordered <two minutes prior.  Max assist for awareness required.  Pt would benefit from ongoing SLP to address memory and awareness, and as much as possible while he awaits a SNF rehab bed.    HPI HPI: Pt is a 66 year old man admitted after EMS found him unresponsive and hypotensive; Pt was intubated on 11/14/2021-11/17/2021. MRI scan revealed several small infarcts in right hemisphere.      SLP Plan  Continue with current plan of care      Recommendations for follow up therapy are one component of a multi-disciplinary discharge planning process, led by the attending physician.  Recommendations may be updated based on patient status, additional functional criteria and insurance authorization.    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 Manuel Frederic, MA CCC/SLP Acute Rehabilitation  Services Office number [phone] Pager [phone]   Manuel Kramer Manuel Kramer  11/28/2021, 4:52 PM

## 2021-11-28 NOTE — Plan of Care (Signed)
Problem: Elimination: Goal: Will not experience complications related to bowel motility Outcome: Progressing   Problem: Education: Goal: Knowledge of secondary prevention will improve (SELECT ALL) Outcome: Progressing   Problem: Safety: Goal: Non-violent Restraint(s) Outcome: Completed/Met

## 2021-11-28 NOTE — Progress Notes (Signed)
Inpatient Rehab Admissions Coordinator:   I spoke with Pt.'s son regarding CIR vs SNF. Son states that all family works and is not able to provide support following d/c from CIR. As a result, the family would prefer SNF in the [place] area. CIR will sign off.   Megan Salon, MS, CCC-SLP Rehab Admissions Coordinator  [phone] (celll) [phone] (office)

## 2021-11-28 NOTE — Progress Notes (Signed)
PROGRESS NOTE  Manuel Kramer  DOB: Nov 05, 1955  PCP: Diamantina Providence, FNP XTA:[id number]  DOA: 11/14/2021  LOS: 14 days  Hospital Day: 15  Chief Complaint  Patient presents with   Altered Mental Status   Brief narrative: Manuel Kramer is a 66 y.o. male with PMH significant for DM2, HTN, chronic diarrhea who lives at home alone. Patient was brought to the ED on 11/14/2021 by EMS for altered mental status. His neighbors noted his medical alarm going at 1:30 AM and hence EMS and police were involved.  EMS found patient agitated and combative with nonverbal status and nonpurposeful movements.  Patient was brought to the ED.  In the ED he was noted to have left-sided gaze preference.  His GCS was low and needed emergent intubation.  He was bolused with Keppra, placed on nitro infusion, later on Cleviprex for hypertension. Admitted to ICU 1/18, MRI brain showed acute infarcts within the right frontal operculum, right internal capsule, right frontoparietal periventricular white matter and posterior right frontal lobe.  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  Mild to moderate generalized cerebral atrophy and mild cerebellar atrophy. 1/19, extubated 1/21, transfer out of ICU to [facility]  Subjective: Patient was seen and examined this morning.  Not in distress.  Seems to be having diarrhea but less frequent as per nurse. Blood pressure still running elevated up to 180s.  Further assessment done today. Blood sugar level running in the as well insulin increase.  Assessment/Plan: Multifocal lacunar infarcts -Likely due to dehydration in the setting of small vessel disease and chronic hypertensive microangiopathy -Echo with bubble showed EF of 60 to 65%, no wall motion abnormality. -Neurology consultation was obtained.    -Stroke work-up completed.  Echo with EF 60 to 65%, severe LVH, no atrial level shunt. -Carotid artery duplex without significant stenosis -A1c 9.9, LDL 88 -Neurology recommended aspirin 81 mg daily and Lipitor 40 mg at bedtime. -PT/OT/ST eval obtained.  CIR recommended.  Uncontrolled hypertension -Patient probably had chronic uncontrolled hypertension leading to hypertensive microangiopathy, severe LVH -Blood pressure medicines being adjusted.  In last 24 hours, blood pressure has reached up to 180s.  This morning, I switched him from metoprolol to Coreg and added lisinopril.  Continue clonidine patch, amlodipine, Aldactone and hydralazine.  Continue to monitor blood pressure.  Discussed with pharmacy.  Dysphagia -Dysphagia 3 diet per speech.  Acute metabolic encephalopathy -Probably due to stroke self.  In the ED, patient was febrile as well.  Further work-up did not suggest any neurological infection.  Currently not on any antibiotics or antiviral.   -Mental status gradually improving.  Uncontrolled type 2 diabetes mellitus with hyperglycemia -A1c 9.9 on 11/14/2021 -Home meds include Lantus 30 to 64 units at bedtime, empagliflozin-linagliptin daily -Currently on Semglee and empagliflozin/linagliptin.  Blood sugar level continues to remain elevated.  Increase Semglee to 30 units this morning.  May need further adjustment. Recent Labs  Lab 11/27/21 0621 11/27/21 1219 11/27/21 1611 11/27/21 2038 11/28/21 0637  GLUCAP 229* 241* 275* 240* 200*   AKI on CKD 3B -Baseline creatinine 1.4 from 2021.  This is present, creatinine increased to peak at 2.06.  Improving gradually, 1.63 on last check on 1/29. Recent Labs    11/16/21 0721 11/17/21 0457 11/18/21 0333 11/19/21 0235 11/20/21 0310 11/21/21 0339 11/23/21 0329 11/24/21 0331 11/25/21 0306 11/27/21 0058  BUN 24* 24* 29* 29* 30* 29* 25*  26* 23 21  CREATININE 2.06* 1.83* 1.81* 1.62* 1.55* 1.45* 1.73* 1.62* 1.69* 1.63*    Diarrhea -Per nursing, diarrhea seems to be improving on Imodium.  Continue the same for now.  No evidence of infection at this time.  Mobility: PT eval obtained. Living condition: Was at home Goals of care:   Code Status: Full Code  Nutritional status: Body mass index is 23.88 kg/m.  Nutrition Problem: Severe Malnutrition Etiology: chronic illness Signs/Symptoms: severe muscle depletion, severe fat depletion Diet:  Diet Order             DIET DYS 3 Room service appropriate? Yes with Assist; Fluid consistency: Thin  Diet effective now                  DVT prophylaxis:  heparin injection 5,000 Units Start: 11/16/21 1400 SCDs Start: 11/14/21 0830   Antimicrobials: None Fluid: None Consultants: Neurology Family Communication: None at bedside  Status is: Inpatient  Continue in-hospital care because: Pending CIR Level of care: Med-Surg   Dispo: The patient is from: Home              Anticipated d/c is to: CIR              Patient currently is medically stable to d/c.   Difficult to place patient No     Infusions:    Scheduled Meds:  amLODipine  10 mg Oral Daily   aspirin  81 mg Oral Daily   atorvastatin  40 mg Oral Daily   carvedilol  6.25 mg Oral BID WC   cloNIDine  0.2 mg Transdermal Weekly   empagliflozin  10 mg Oral Daily   And   linagliptin  5 mg Oral Daily   heparin injection (subcutaneous)  5,000 Units Subcutaneous Q8H   hydrALAZINE  100 mg Oral Q8H   insulin aspart  0-9 Units Subcutaneous TID WC   insulin glargine-yfgn  30 Units Subcutaneous Daily   lisinopril  10 mg Oral Daily   loperamide  2 mg Oral BID   mouth rinse  15 mL Mouth Rinse q12n4p   multivitamin with minerals  1 tablet Oral Daily   spironolactone  50 mg Oral Daily    PRN meds: dextrose   Antimicrobials: Anti-infectives (From admission, onward)    Start     Dose/Rate Route Frequency Ordered Stop   11/15/21 1000  vancomycin (VANCOREADY) IVPB 750 mg/150 mL  Status:   Discontinued        750 mg 150 mL/hr over 60 Minutes Intravenous Every 24 hours 11/14/21 0556 11/16/21 1045   11/14/21 2200  ceFEPIme (MAXIPIME) 2 g in sodium chloride 0.9 % 100 mL IVPB  Status:  Discontinued        2 g 200 mL/hr over 30 Minutes Intravenous Every 12 hours 11/14/21 0556 11/16/21 1045   11/14/21 1945  acyclovir (ZOVIRAX) 600 mg in dextrose 5 % 100 mL IVPB  Status:  Discontinued        10 mg/kg  60 kg 112 mL/hr over 60 Minutes Intravenous Every 12 hours 11/14/21 1847 11/18/21 0927   11/14/21 0530  ceFEPIme (MAXIPIME) 2 g in sodium chloride 0.9 % 100 mL IVPB        2 g 200 mL/hr over 30 Minutes Intravenous  Once 11/14/21 0523 11/14/21 0633   11/14/21 0530  metroNIDAZOLE (FLAGYL) IVPB 500 mg        500 mg 100 mL/hr over 60 Minutes Intravenous  Once 11/14/21 0523 11/14/21 9030  11/14/21 0530  vancomycin (VANCOCIN) IVPB 1000 mg/200 mL premix        1,000 mg 200 mL/hr over 60 Minutes Intravenous  Once 11/14/21 0523 11/14/21 0811       Objective: Vitals:   11/27/21 2335 11/28/21 0413  BP: (!) 170/83 (!) 180/97  Pulse: 87 78  Resp: 18 18  Temp: 98.2 F (36.8 C) 98.7 F (37.1 C)  SpO2: 99% 98%    Intake/Output Summary (Last 24 hours) at 11/28/2021 1005 Last data filed at 11/27/2021 1919 Gross per 24 hour  Intake --  Output 325 ml  Net -325 ml    Filed Weights   11/16/21 0500 11/17/21 0500 11/20/21 0400  Weight: 61 kg 61 kg 67.1 kg   Weight change:  Body mass index is 23.88 kg/m.   Physical Exam: General exam: Pleasant elderly African-American male.  Not in physical distress Skin: No rashes, lesions or ulcers. HEENT: Atraumatic, normocephalic, no obvious bleeding Lungs: Clear to auscultation bilaterally CVS: Regular rate and rhythm, no murmur GI/Abd soft, nontender, nondistended, present CNS: Alert, awake, oriented to place and person Psychiatry: Mood appropriate Extremities: No pedal edema, no calf tenderness  Data Review: I have personally reviewed  the laboratory data and studies available.  F/u labs ordered Unresulted Labs (From admission, onward)    None       Signed, Lorin GlassBinaya Lisle Skillman, MD [facility] 11/28/2021

## 2021-11-28 NOTE — Progress Notes (Signed)
Unsuccessful in obtaining stool sample earlier.  Patient has had no further BMs since 1500.  Will notify oncoming shift to continue to monitor for sample.   Patient is also aware of need for sample.

## 2021-11-28 NOTE — Progress Notes (Signed)
Physical Therapy Treatment Patient Details Name: Manuel Kramer MRN: [id number] DOB: 12-Jun-1956 Today's Date: 11/28/2021   History of Present Illness 66 y/o male presented to ED on 11/14/21 after being found in his home altered and combative. Unresponsive in ED. EEG suggestive of severe encephalopathy. 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 Intubated 1/16-1/19. PMH: HTN, DM    PT Comments    Pt is definite\ly progressing toward goals, but still need heavy cuing for command following, redirection, completing a task and remembering what was done.   Emphasis on transition/transfer safety, progression of gait stability/speed and overall quality plus challenge to static and dynamic balance.   Recommendations for follow up therapy are one component of a multi-disciplinary discharge planning process, led by the attending physician.  Recommendations may be updated based on patient status, additional functional criteria and insurance authorization.  Follow Up Recommendations  Acute inpatient rehab (3hours/day)     Assistance Recommended at Discharge Frequent or constant Supervision/Assistance  Patient can return home with the following A little help with walking and/or transfers;A little help with bathing/dressing/bathroom;Assistance with cooking/housework;Direct supervision/assist for medications management;Assist for transportation;Help with stairs or ramp for entrance   Equipment Recommendations  Other (comment) (TBA)    Recommendations for Other Services Rehab consult     Precautions / Restrictions Precautions Precautions: Fall Precaution Comments: aphasia Restrictions Weight Bearing Restrictions: No     Mobility  Bed Mobility Overal bed mobility: Needs Assistance Bed Mobility: Supine to Sit, Sit to Supine     Supine to sit:  Supervision Sit to supine: Supervision        Transfers Overall transfer level: Needs assistance Equipment used: None Transfers: Sit to/from Stand Sit to Stand: Min assist           General transfer comment: stability assist from bed    Ambulation/Gait Ambulation/Gait assistance: Min assist Gait Distance (Feet): 70 Feet (x2) Assistive device: None Gait Pattern/deviations: Step-through pattern Gait velocity: Decreased Gait velocity interpretation: <1.8 ft/sec, indicate of risk for recurrent falls   General Gait Details: slow, deliberate gait with inability to increase speed significantly.  Cues for redirection with occasional loss of focus. pt/therapist noted fatigue and pt with 1-2/4 dyspnea.   Stairs             Wheelchair Mobility    Modified Rankin (Stroke Patients Only) Modified Rankin (Stroke Patients Only) Pre-Morbid Rankin Score: No symptoms Modified Rankin: Moderately severe disability     Balance Overall balance assessment: Needs assistance Sitting-balance support: Feet unsupported Sitting balance-Leahy Scale: Fair Sitting balance - Comments: fell backward putting on 1 or 2 socks. Postural control: Other (comment) (mild posterior list) Standing balance support: Single extremity supported, No upper extremity supported Standing balance-Leahy Scale: Fair (toward good.)                   Standardized Balance Assessment Standardized Balance Assessment : Programmer, systems Test Berg Balance Test Sit to Stand: Able to stand  independently using hands Standing Unsupported: Able to stand safely 2 minutes Sitting with Back Unsupported but Feet Supported on Floor or Stool: Able to sit safely and securely 2 minutes Stand to Sit: Controls descent by using hands Transfers: Able to transfer safely, definite need of hands Standing Unsupported with Eyes Closed: Able to stand 3 seconds Standing Ubsupported with Feet Together: Able to place feet together  independently  but unable to hold for 30 seconds From Standing, Reach Forward with Outstretched Arm: Can reach forward >12 cm safely (5") From Standing Position, Pick up Object from Floor: Able to pick up shoe, needs supervision From Standing Position, Turn to Look Behind Over each Shoulder: Looks behind from both sides and weight shifts well Turn 360 Degrees: Able to turn 360 degrees safely but slowly Standing Unsupported, Alternately Place Feet on Step/Stool: Able to complete 4 steps without aid or supervision Standing Unsupported, One Foot in Front: Able to take small step independently and hold 30 seconds Standing on One Leg: Able to lift leg independently and hold equal to or more than 3 seconds Total Score: 39        Cognition Arousal/Alertness: Awake/alert Behavior During Therapy: WFL for tasks assessed/performed Overall Cognitive Status: Impaired/Different from baseline                   Orientation Level: Time Current Attention Level: Selective Memory: Decreased short-term memory Following Commands: Follows multi-step commands inconsistently, Follows one step commands with increased time Safety/Judgement: Decreased awareness of deficits Awareness: Emergent Problem Solving: Slow processing, Decreased initiation, Difficulty sequencing, Requires verbal cues, Requires tactile cues          Exercises      General Comments General comments (skin integrity, edema, etc.): vss      Pertinent Vitals/Pain Pain Assessment Pain Assessment: Faces Faces Pain Scale: Hurts little more Pain Location: "privates--scrotum" Pain Descriptors / Indicators: Discomfort, Tightness, Grimacing Pain Intervention(s): Limited activity within patient's tolerance, Monitored during session    Home Living                          Prior Function            PT Goals (current goals can now be found in the care plan section) Acute Rehab PT Goals PT Goal Formulation: Patient  unable to participate in goal setting Time For Goal Achievement: 12/02/21 Potential to Achieve Goals: Good Progress towards PT goals: Progressing toward goals    Frequency    Min 4X/week      PT Plan Current plan remains appropriate    Co-evaluation              AM-PAC PT "6 Clicks" Mobility   Outcome Measure  Help needed turning from your back to your side while in a flat bed without using bedrails?: A Little Help needed moving from lying on your back to sitting on the side of a flat bed without using bedrails?: A Little Help needed moving to and from a bed to a chair (including a wheelchair)?: A Little Help needed standing up from a chair using your arms (e.g., wheelchair or bedside chair)?: A Little Help needed to walk in hospital room?: A Little Help needed climbing 3-5 steps with a railing? : A Lot (pt needing moderate cuing overall to follow direction, focus on task, complete task given, remember what was done.) 6 Click Score: 17    End of Session   Activity Tolerance: Patient limited by fatigue;Patient tolerated treatment well Patient left: in bed;with call bell/phone within reach;with bed alarm set Nurse Communication: Mobility status PT Visit Diagnosis: Muscle weakness (generalized) (M62.81);Other abnormalities of gait and mobility (R26.89);Other symptoms and signs involving the nervous system (R29.898)     Time: [id number] PT Time Calculation (min) (ACUTE ONLY): 30 min  Charges:  $Gait Training: 8-22 mins $Neuromuscular Re-education: 8-22 mins  11/28/2021  Jacinto Halim., PT Acute Rehabilitation Services [phone]  (pager) [phone]  (office)   Eliseo Gum Qasim Diveley 11/28/2021, 11:58 AM

## 2021-11-28 NOTE — Progress Notes (Signed)
Occupational Therapy Treatment Patient Details Name: Manuel Kramer MRN: [id number] DOB: December 06, 1955 Today's Date: 11/28/2021   History of present illness 66 y/o male presented to ED on 11/14/21 after being found in his home altered and combative. Unresponsive in ED. EEG suggestive of severe encephalopathy. 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 Intubated 1/16-1/19. PMH: HTN, DM   OT comments  Manuel Kramer is progressing well physically but remains limited by impaired cognition. Attempted short blessed assessment with a score of 24/28, pt only able to complete counting backwards from 20. Pt benefit from cues for all sequencing and safety, with no carry over noted. He required supervision for all transfers and mobility within the room. He continues to benefit from OT acutely. CIR has declined pt due to no support at d/c; recommendation updated to SNF.    Recommendations for follow up therapy are one component of a multi-disciplinary discharge planning process, led by the attending physician.  Recommendations may be updated based on patient status, additional functional criteria and insurance authorization.    Follow Up Recommendations  Skilled nursing-short term rehab (<3 hours/day) (CIR decline, no 24/7 family support. Family agreeable to SNF.)    Assistance Recommended at Discharge Frequent or constant Supervision/Assistance  Patient can return home with the following  Direct supervision/assist for financial management;Assistance with cooking/housework;Assist for transportation;Help with stairs or ramp for entrance;A little help with walking and/or transfers;A little help with bathing/dressing/bathroom;Direct supervision/assist for medications management   Equipment Recommendations   (RW)       Precautions / Restrictions Precautions Precautions:  Fall Precaution Comments: aphasia Restrictions Weight Bearing Restrictions: No       Mobility Bed Mobility Overal bed mobility: Needs Assistance Bed Mobility: Supine to Sit, Sit to Supine     Supine to sit: Supervision Sit to supine: Supervision   General bed mobility comments: simple cue for safety, pt crawled back into bed, knees first. unable to verbalize an easier way to tranfer back into the bed    Transfers Overall transfer level: Needs assistance Equipment used: Rolling walker (2 wheels) Transfers: Sit to/from Stand Sit to Stand: Supervision           General transfer comment: transfers from the bed, chair and toilet this session     Balance Overall balance assessment: Needs assistance Sitting-balance support: Feet unsupported Sitting balance-Leahy Scale: Fair     Standing balance support: Single extremity supported, During functional activity Standing balance-Leahy Scale: Fair     ADL either performed or assessed with clinical judgement   ADL Overall ADL's : Needs assistance/impaired   Toilet Transfer: Min guard;Ambulation;Comfort height toilet;Rolling walker (2 wheels);Grab bars Toilet Transfer Details (indicate cue type and reason): verbal cues for safety         Functional mobility during ADLs: Min guard;Rolling walker (2 wheels) General ADL Comments: pt moving well OOB this session, limited by poor safety awareness and cognition.    Extremity/Trunk Assessment Upper Extremity Assessment Upper Extremity Assessment: RUE deficits/detail;LUE deficits/detail RUE Deficits / Details: generalized weakness, ROM WFL. finger to thumb is slow and required increased cues. RUE Coordination: decreased fine motor LUE Deficits / Details: generalized weakness. [place] with one handed self feeding while LUE was under tray, once therapist uncovered LUE and cued him to use both hands he used LUE funcitonally. LUE Sensation: decreased proprioception LUE  Coordination: WNL   Lower Extremity Assessment  Lower Extremity Assessment: Defer to PT evaluation        Vision   Vision Assessment?: Vision impaired- to be further tested in functional context   Perception Perception Perception: Not tested   Praxis Praxis Praxis: Not tested    Cognition Arousal/Alertness: Awake/alert Behavior During Therapy: WFL for tasks assessed/performed Overall Cognitive Status: Impaired/Different from baseline Area of Impairment: Attention, Memory, Following commands, Safety/judgement, Awareness, Problem solving, Orientation                 Orientation Level: Time, Disoriented to Current Attention Level: Selective Memory: Decreased short-term memory Following Commands: Follows multi-step commands inconsistently, Follows one step commands with increased time Safety/Judgement: Decreased awareness of deficits Awareness: Emergent Problem Solving: Slow processing, Decreased initiation, Difficulty sequencing, Requires verbal cues, Requires tactile cues General Comments: Attempted short blessed test: pt only able to count backward from 20 accurately, otherwise all aspects of the assessment were inaccurate or not attempted. Pt did well with immediate recall, unable to recall after any amount of delay.        Exercises      Shoulder Instructions       General Comments vss, pt had BM this session    Pertinent Vitals/ Pain       Pain Assessment Pain Assessment: Faces Faces Pain Scale: Hurts a little bit Pain Location: generalized wiht movement Pain Descriptors / Indicators: Discomfort, Tightness, Grimacing Pain Intervention(s): Limited activity within patient's tolerance, Monitored during session  Home Living Family/patient expects to be discharged to:: Private residence Living Arrangements: Alone                                          Frequency  Min 2X/week        Progress Toward Goals  OT Goals(current goals can  now be found in the care plan section)  Progress towards OT goals: Progressing toward goals  Acute Rehab OT Goals Patient Stated Goal: to get some rest OT Goal Formulation: With patient Time For Goal Achievement: 12/02/21 Potential to Achieve Goals: Good ADL Goals Pt Will Perform Eating: with min guard assist;sitting;with adaptive utensils Pt Will Perform Grooming: with min guard assist;sitting Pt Will Transfer to Toilet: with mod assist;stand pivot transfer;bedside commode Pt Will Perform Toileting - Clothing Manipulation and hygiene: with mod assist;sit to/from stand Additional ADL Goal #1: Pt will sit EOB at min guard assist level for approx 5 min to participate in ADL.  Plan Discharge plan needs to be updated       AM-PAC OT "6 Clicks" Daily Activity     Outcome Measure   Help from another person eating meals?: A Little Help from another person taking care of personal grooming?: A Little Help from another person toileting, which includes using toliet, bedpan, or urinal?: A Little Help from another person bathing (including washing, rinsing, drying)?: A Little Help from another person to put on and taking off regular upper body clothing?: A Little Help from another person to put on and taking off regular lower body clothing?: A Little 6 Click Score: 18    End of Session Equipment Utilized During Treatment: Gait belt;Rolling walker (2 wheels)  OT Visit Diagnosis: Other symptoms and signs involving the nervous system (R29.898);Other symptoms and signs involving cognitive function;Cognitive communication deficit (R41.841) Symptoms and signs involving cognitive functions: Cerebral infarction   Activity Tolerance Patient tolerated treatment well   Patient Left  in bed;with call bell/phone within reach;with bed alarm set   Nurse Communication Mobility status        Time: [id number] OT Time Calculation (min): 21 min  Charges: OT General Charges $OT Visit: 1 Visit OT  Treatments $Self Care/Home Management : 8-22 mins   Joaopedro Eschbach A Trason Shifflet 11/28/2021, 2:20 PM

## 2021-11-28 NOTE — Progress Notes (Signed)
Inpatient Diabetes Program Recommendations  AACE/ADA: New Consensus Statement on Inpatient Glycemic Control (2015)  Target Ranges:  Prepandial:   less than 140 mg/dL      Peak postprandial:   less than 180 mg/dL (1-2 hours)      Critically ill patients:  140 - 180 mg/dL   Lab Results  Component Value Date   GLUCAP 265 (H) 11/28/2021   HGBA1C 9.9 (H) 11/14/2021    Review of Glycemic Control  Latest Reference Range & Units 11/27/21 06:21 11/27/21 12:19 11/27/21 16:11 11/27/21 20:38 11/28/21 06:37 11/28/21 12:10  Glucose-Capillary 70 - 99 mg/dL 456 (H) 256 (H) 389 (H) 240 (H) 200 (H) 265 (H)  (H): Data is abnormally high  Diabetes history: DM2 Outpatient Diabetes medications: Lantus 30-64 units QD??  PCP office prescribed Lantus 24 QHS Current orders for Inpatient glycemic control: Semglee 30 units QD, (increased today), Noovlog 0-9 units TID, Jardiance 10 mg QD, Tradjenta 5 mg QD  Inpatient Diabetes Program Recommendations:    1) Add carb modified to diet 2) Novolog 2 units TID with meals if eats at least 50%  Spoke with patient at bedside.  He is sipping on a regular Ginger-Ale.  Reviewed patient's current A1c of 9.9% (average blood sugar of 237 mg/dL).  Explained what a A1c is and what it measures. Also reviewed goal A1c with patient, importance of good glucose control @ home, and blood sugar goals.  Ordered LWWD booklet which is at bedside.    He states he takes Lantus at home but does not remember his dose.  He states he does not take Lantus regularly.  He does not have a glucometer at home.  Called Dr. Marygrace Drought office (PCP in [place]); they have Lantus 24 units QHS listed.    Explained he will need to check his blood sugar every morning fasting and before bedtime when he goes home.  He needs to take his insulin as prescribed.  Educated him on long and short term complications of uncontrolled glucose.    PT is recommending CIR.    Will continue to follow while inpatient.  Thank  you, Dulce Sellar, MSN, RN Diabetes Coordinator Inpatient Diabetes Program [phone] (team pager from 8a-5p)

## 2021-11-29 LAB — GASTROINTESTINAL PANEL BY PCR, STOOL (REPLACES STOOL CULTURE)

## 2021-11-29 LAB — GLUCOSE, CAPILLARY
Glucose-Capillary: 121 mg/dL — ABNORMAL HIGH (ref 70–99)
Glucose-Capillary: 147 mg/dL — ABNORMAL HIGH (ref 70–99)
Glucose-Capillary: 198 mg/dL — ABNORMAL HIGH (ref 70–99)
Glucose-Capillary: 239 mg/dL — ABNORMAL HIGH (ref 70–99)

## 2021-11-29 LAB — CBC WITH DIFFERENTIAL/PLATELET
Abs Immature Granulocytes: 0.18 10*3/uL — ABNORMAL HIGH (ref 0.00–0.07)
Basophils Absolute: 0 10*3/uL (ref 0.0–0.1)
Basophils Relative: 0 %
Eosinophils Absolute: 0.3 10*3/uL (ref 0.0–0.5)
Eosinophils Relative: 3 %
HCT: 26.9 % — ABNORMAL LOW (ref 39.0–52.0)
Hemoglobin: 8.6 g/dL — ABNORMAL LOW (ref 13.0–17.0)
Immature Granulocytes: 1 %
Lymphocytes Relative: 17 %
Lymphs Abs: 2.2 10*3/uL (ref 0.7–4.0)
MCH: 33 pg (ref 26.0–34.0)
MCHC: 32 g/dL (ref 30.0–36.0)
MCV: 103.1 fL — ABNORMAL HIGH (ref 80.0–100.0)
Monocytes Absolute: 1.2 10*3/uL — ABNORMAL HIGH (ref 0.1–1.0)
Monocytes Relative: 9 %
Neutro Abs: 9.2 10*3/uL — ABNORMAL HIGH (ref 1.7–7.7)
Neutrophils Relative %: 70 %
Platelets: 332 10*3/uL (ref 150–400)
RBC: 2.61 MIL/uL — ABNORMAL LOW (ref 4.22–5.81)
RDW: 16.4 % — ABNORMAL HIGH (ref 11.5–15.5)
WBC: 13.1 10*3/uL — ABNORMAL HIGH (ref 4.0–10.5)
nRBC: 0.3 % — ABNORMAL HIGH (ref 0.0–0.2)

## 2021-11-29 LAB — BASIC METABOLIC PANEL
Anion gap: 6 (ref 5–15)
BUN: 18 mg/dL (ref 8–23)
CO2: 23 mmol/L (ref 22–32)
Calcium: 8.5 mg/dL — ABNORMAL LOW (ref 8.9–10.3)
Chloride: 111 mmol/L (ref 98–111)
Creatinine, Ser: 1.53 mg/dL — ABNORMAL HIGH (ref 0.61–1.24)
GFR, Estimated: 50 mL/min — ABNORMAL LOW (ref >60)
Glucose, Bld: 138 mg/dL — ABNORMAL HIGH (ref 70–99)
Potassium: 3.9 mmol/L (ref 3.5–5.1)
Sodium: 140 mmol/L (ref 135–145)

## 2021-11-29 LAB — PHOSPHORUS: Phosphorus: 3.4 mg/dL (ref 2.5–4.6)

## 2021-11-29 LAB — MAGNESIUM: Magnesium: 1.9 mg/dL (ref 1.7–2.4)

## 2021-11-29 MED ORDER — METRONIDAZOLE 500 MG PO TABS
500.0000 mg | ORAL_TABLET | Freq: Two times a day (BID) | ORAL | Status: DC
  Administered 2021-11-29 – 2021-12-05 (×13): 500 mg via ORAL
  Filled 2021-11-29 (×13): qty 1

## 2021-11-29 MED ORDER — LOPERAMIDE HCL 2 MG PO CAPS
2.0000 mg | ORAL_CAPSULE | Freq: Four times a day (QID) | ORAL | Status: DC
  Administered 2021-11-29 – 2021-11-30 (×4): 2 mg via ORAL
  Filled 2021-11-29 (×4): qty 1

## 2021-11-29 MED ORDER — CIPROFLOXACIN HCL 500 MG PO TABS
500.0000 mg | ORAL_TABLET | Freq: Two times a day (BID) | ORAL | Status: DC
Start: 2021-11-29 — End: 2021-12-06
  Administered 2021-11-29 – 2021-12-05 (×13): 500 mg via ORAL
  Filled 2021-11-29 (×13): qty 1

## 2021-11-29 NOTE — Progress Notes (Signed)
Nutrition Follow-up  DOCUMENTATION CODES:   Severe malnutrition in context of chronic illness  INTERVENTION:  -continue snacks TID -continue MVI with minerals daily  NUTRITION DIAGNOSIS:   Severe Malnutrition related to chronic illness as evidenced by severe muscle depletion, severe fat depletion.  ongoing  GOAL:   Patient will meet greater than or equal to 90% of their needs  progressing  MONITOR:   PO intake, Supplement acceptance  REASON FOR ASSESSMENT:   Consult Assessment of nutrition requirement/status  ASSESSMENT:   Pt with PMH of tobacco abuse, DM, HTN, and chronic diarrhea admitted 1/16 after being found by police unresponsive in home; pt with acute metabolic encephalopathy and sepsis.  Pt eating lunch at time of RD visit and denies having any complaints at this time. Discussed pt with RN who reports pt doing well with meals. States ONS were discontinued due to thought that they were contributing to diarrhea. Per MD, pt has had multiple episodes of loose bowel movements over the last few days. GI pathogen panel sent, results pending. Pt currently on scheduled Imodium and being initiated on abx.   PO Intake: 50-100% x 7 recorded meals (89% avg meal intake)   Medications:  amLODipine  10 mg Oral Daily   aspirin  81 mg Oral Daily   atorvastatin  40 mg Oral Daily   carvedilol  6.25 mg Oral BID WC   ciprofloxacin  500 mg Oral BID   cloNIDine  0.3 mg Transdermal Weekly   empagliflozin  10 mg Oral Daily   And   linagliptin  5 mg Oral Daily   heparin injection (subcutaneous)  5,000 Units Subcutaneous Q8H   hydrALAZINE  100 mg Oral Q8H   insulin aspart  0-9 Units Subcutaneous TID WC   insulin glargine-yfgn  30 Units Subcutaneous Daily   lisinopril  10 mg Oral Daily   loperamide  2 mg Oral Q6H   mouth rinse  15 mL Mouth Rinse q12n4p   metroNIDAZOLE  500 mg Oral Q12H   multivitamin with minerals  1 tablet Oral Daily   Labs: Recent Labs  Lab 11/25/21 0306  11/27/21 0058 11/29/21 0443  NA 139 139 140  K 4.1 4.1 3.9  CL 110 104 111  CO2 25 27 23   BUN 23 21 18   CREATININE 1.69* 1.63* 1.53*  CALCIUM 8.1* 8.6* 8.5*  MG  --   --  1.9  PHOS  --   --  3.4  GLUCOSE 128* 203* 138*  CBGs: 147-121-182-182   Diet Order:   Diet Order             DIET DYS 3 Room service appropriate? Yes with Assist; Fluid consistency: Thin  Diet effective now                   EDUCATION NEEDS:   Not appropriate for education at this time  Skin:  Skin Assessment: Reviewed RN Assessment  Last BM:  1/31  Height:   Ht Readings from Last 1 Encounters:  11/14/21 5\' 6"  (1.676 m)    Weight:   Wt Readings from Last 1 Encounters:  11/20/21 67.1 kg    BMI:  Body mass index is 23.88 kg/m.  Estimated Nutritional Needs:   Kcal:  1800-2000  Protein:  90-105 grams  Fluid:  >1.8 L/day     11/16/21., MS, RD, LDN (she/her/hers) RD pager number and weekend/on-call pager number located in Amion.

## 2021-11-29 NOTE — TOC Progression Note (Signed)
Transition of Care [facility]) - Progression Note    Patient Details  Name: HUMBERTO ADDO MRN: [id number] Date of Birth: February 02, 1956  [facility] of Care [facility] CM/SW Contact  Kermit Balo, RN Phone Number: 11/29/2021, 10:17 AM  Clinical Narrative:    Son asked for [facility] but they are in a covid outbreak and then [facility] but they have a wait list for medicaid. Son then selected [name]. Cm has asked [name] to start insurance. Daughter is going to go and tour potentially.  TOC following.   Expected Discharge Plan: Skilled Nursing Facility Barriers to Discharge: English as a second language teacher  Expected Discharge Plan and Services Expected Discharge Plan: Skilled Nursing Facility In-house Referral: Clinical Social Work Discharge Planning Services: CM Consult Post Acute Care Choice: Skilled Nursing Facility Living arrangements for the past 2 months: Single Family Home                                       Social Determinants of Health (SDOH) Interventions    Readmission Risk Interventions No flowsheet data found.

## 2021-11-29 NOTE — Progress Notes (Signed)
Physical Therapy Treatment Patient Details Name: Manuel Kramer MRN: [id number] DOB: 06/28/1956 Today's Date: 11/29/2021   History of Present Illness 66 y/o male presented to ED on 11/14/21 after being found in his home altered and combative. Unresponsive in ED. EEG suggestive of severe encephalopathy. 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 Intubated 1/16-1/19. PMH: HTN, DM    PT Comments    Pt continues with very impaired short term memory, constantly asking the same questions t/o session. Pt with decreased insight to safety and deficits as well. Pt continues to require RW for safe ambulation. Pt demo's inability to care for self at this time for reasons mentioned above in addition to not having awareness of urinary and stool incontinence. Acute PT to cont to follow.    Recommendations for follow up therapy are one component of a multi-disciplinary discharge planning process, led by the attending physician.  Recommendations may be updated based on patient status, additional functional criteria and insurance authorization.  Follow Up Recommendations  Acute inpatient rehab (3hours/day)     Assistance Recommended at Discharge Frequent or constant Supervision/Assistance  Patient can return home with the following A little help with walking and/or transfers;A little help with bathing/dressing/bathroom;Assistance with cooking/housework;Direct supervision/assist for medications management;Assist for transportation;Help with stairs or ramp for entrance   Equipment Recommendations  Other (comment) (TBA)    Recommendations for Other Services Rehab consult     Precautions / Restrictions Precautions Precautions: Fall Restrictions Weight Bearing Restrictions: No     Mobility  Bed Mobility Overal bed mobility: Needs Assistance Bed Mobility:  Supine to Sit, Sit to Supine     Supine to sit: Min guard     General bed mobility comments: verbal cues to reach across with R UE for railing to help bring self to EOB, incresaed time    Transfers Overall transfer level: Needs assistance Equipment used: Rolling walker (2 wheels) Transfers: Sit to/from Stand Sit to Stand: Min guard           General transfer comment: verbal cues for hand placement, min guard for safety    Ambulation/Gait Ambulation/Gait assistance: Min assist Gait Distance (Feet): 120 Feet (x1, 80x1) Assistive device: None, Rolling walker (2 wheels) Gait Pattern/deviations: Step-through pattern, Decreased stride length, Drifts right/left Gait velocity: Decreased Gait velocity interpretation: <1.31 ft/sec, indicative of household ambulator   General Gait Details: pt repeated asked "what way" despite being told and going in a circle. pt with report of fatigue, 120' with RW at min guard with verbal cues to stand upright and stay in walker. trialed amb without AD, pt with progressive use of PT's R UE via HHA with onset of fatigue, pt with wider base of support as well and less instability   Stairs             Wheelchair Mobility    Modified Rankin (Stroke Patients Only) Modified Rankin (Stroke Patients Only) Pre-Morbid Rankin Score: No symptoms Modified Rankin: Moderately severe disability     Balance Overall balance assessment: Needs assistance Sitting-balance support: Feet unsupported Sitting balance-Leahy Scale: Fair Sitting balance - Comments: pt able to don socks at EOB   Standing balance support: Single extremity supported, During functional activity Standing balance-Leahy Scale: Fair  Cognition Arousal/Alertness: Awake/alert Behavior During Therapy: WFL for tasks assessed/performed Overall Cognitive Status: Impaired/Different from baseline Area of Impairment: Attention, Memory, Awareness,  Problem solving, Orientation, Safety/judgement                 Orientation Level: Time, Disoriented to Current Attention Level: Selective Memory: Decreased short-term memory (asked if RN gave him his shot 3 times after pt already received his shot) Following Commands: Follows multi-step commands inconsistently, Follows one step commands with increased time Safety/Judgement: Decreased awareness of deficits Awareness: Emergent Problem Solving: Slow processing, Decreased initiation, Difficulty sequencing, Requires verbal cues, Requires tactile cues General Comments: pt with noted STM deficits, repeated asking several questions t/o session, pt with improved problem solving, ie. askign for food after eating a pill that didn't taste good to get the taste out of mouth, pt unable to navigate back to room        Exercises      General Comments General comments (skin integrity, edema, etc.): VSS, pt received soiled in the bed, pt unaware      Pertinent Vitals/Pain Pain Assessment Pain Assessment: Faces Faces Pain Scale: No hurt    Home Living                          Prior Function            PT Goals (current goals can now be found in the care plan section) Acute Rehab PT Goals PT Goal Formulation: Patient unable to participate in goal setting Time For Goal Achievement: 12/02/21 Potential to Achieve Goals: Good Progress towards PT goals: Progressing toward goals    Frequency    Min 4X/week      PT Plan Current plan remains appropriate    Co-evaluation              AM-PAC PT "6 Clicks" Mobility   Outcome Measure  Help needed turning from your back to your side while in a flat bed without using bedrails?: A Little Help needed moving from lying on your back to sitting on the side of a flat bed without using bedrails?: A Little Help needed moving to and from a bed to a chair (including a wheelchair)?: A Little Help needed standing up from a chair  using your arms (e.g., wheelchair or bedside chair)?: A Little Help needed to walk in hospital room?: A Little Help needed climbing 3-5 steps with a railing? : A Lot 6 Click Score: 17    End of Session Equipment Utilized During Treatment: Gait belt Activity Tolerance: Patient limited by fatigue;Patient tolerated treatment well Patient left: with call bell/phone within reach;in chair;with chair alarm set Nurse Communication: Mobility status (soiled bed) PT Visit Diagnosis: Muscle weakness (generalized) (M62.81);Other abnormalities of gait and mobility (R26.89);Other symptoms and signs involving the nervous system (R29.898)     Time: 5597-4163 PT Time Calculation (min) (ACUTE ONLY): 25 min  Charges:  $Gait Training: 23-37 mins                     Lewis Shock, PT, DPT Acute Rehabilitation Services Pager #: [phone] Office #: [phone]    Iona Hansen 11/29/2021, 1:55 PM

## 2021-11-29 NOTE — Progress Notes (Signed)
PROGRESS NOTE  Manuel Kramer  DOB: 01-13-1956  PCP: Diamantina Providence, FNP RFF:[id number]  DOA: 11/14/2021  LOS: 15 days  Hospital Day: 16  Chief Complaint  Patient presents with   Altered Mental Status   Brief narrative: Manuel Kramer is a 66 y.o. male with PMH significant for DM2, HTN, chronic diarrhea who lives at home alone. Patient was brought to the ED on 11/14/2021 by EMS for altered mental status. His neighbors noted his medical alarm going at 1:30 AM and hence EMS and police were involved.  EMS found patient agitated and combative with nonverbal status and nonpurposeful movements.  Patient was brought to the ED.  In the ED he was noted to have left-sided gaze preference.  His GCS was low and needed emergent intubation.  He was bolused with Keppra, placed on nitro infusion, later on Cleviprex for hypertension. Admitted to ICU 1/18, MRI brain showed acute infarcts within the right frontal operculum, right internal capsule, right frontoparietal periventricular white matter and posterior right frontal lobe.  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  Mild to moderate generalized cerebral atrophy and mild cerebellar atrophy. 1/19, extubated 1/21, transfer out of ICU to [facility]  Subjective: Patient was seen and examined this morning.  Lying on bed.  Not in distress.  Alert, awake and oriented only to place. No restlessness or agitation.  Assessment/Plan: Multifocal lacunar infarcts -Likely due to dehydration in the setting of small vessel disease and chronic hypertensive microangiopathy -Echo with bubble showed EF of 60 to 65%, no wall motion abnormality. -Neurology consultation was obtained.   -Stroke work-up completed.  Echo with EF 60 to 65%, severe LVH, no atrial level shunt. -Carotid artery duplex  without significant stenosis -A1c 9.9, LDL 88 -Neurology recommended aspirin 81 mg daily and Lipitor 40 mg at bedtime. -PT/OT/ST eval obtained.  CIR recommended.  Uncontrolled hypertension -Patient probably had chronic uncontrolled hypertension leading to hypertensive microangiopathy, severe LVH -Blood pressure medicines being adjusted despite which his blood pressure has been running elevated.  Last adjusted blood pressure medicines yesterday.  Currently on Coreg, clonidine patch, amlodipine, lisinopril and hydralazine.  Continue to monitor.  Dysphagia -Dysphagia 3 diet per speech.  Diarrhea -Patient has multiple episodes of loose bowel movement for the last few days.  GI pathogen panel sent, pending report.  Currently on Imodium 2 mg every 6 hours scheduled.  Continue the same for now.  WBC count worsening as well.  I will empirically start the patient on oral ciprofloxacin and Flagyl. Recent Labs  Lab 11/24/21 0331 11/25/21 0306 11/27/21 0058 11/29/21 0443  WBC 12.3* 12.1* 11.5* 13.1*   Acute metabolic encephalopathy -Probably due to stroke self.  In the ED, patient was febrile as well.  Further work-up did not suggest any neurological infection.  -Patient is alert awake, not oriented to place only.  Uncontrolled type 2 diabetes mellitus with hyperglycemia -A1c 9.9 on 11/14/2021 -Home meds include Lantus 30 to 64 units at bedtime, empagliflozin-linagliptin daily -Currently on Semglee 30 units daily, empagliflozin/linagliptin and sliding scale insulin with Accu-Cheks. Recent Labs  Lab 11/28/21 1210 11/28/21 1607 11/28/21 2136 11/29/21 0633 11/29/21 1141  GLUCAP 265* 182* 182* 121* 147*   AKI on CKD 3B -Baseline creatinine 1.4 from 2021.  This is present, creatinine increased to peak at 2.06.  Improving gradually, 1.53 today.  The fact that his creatinine is improving without IV fluid while  having diarrhea makes me wonder if his diarrhea is as severe as stated Recent Labs     11/17/21 0457 11/18/21 0333 11/19/21 0235 11/20/21 0310 11/21/21 0339 11/23/21 0329 11/24/21 0331 11/25/21 0306 11/27/21 0058 11/29/21 0443  BUN 24* 29* 29* 30* 29* 25* 26* 23 21 18   CREATININE 1.83* 1.81* 1.62* 1.55* 1.45* 1.73* 1.62* 1.69* 1.63* 1.53*    Mobility: PT eval obtained. Living condition: Was at home Goals of care:   Code Status: Full Code  Nutritional status: Body mass index is 23.88 kg/m.  Nutrition Problem: Severe Malnutrition Etiology: chronic illness Signs/Symptoms: severe muscle depletion, severe fat depletion Diet:  Diet Order             DIET DYS 3 Room service appropriate? Yes with Assist; Fluid consistency: Thin  Diet effective now                  DVT prophylaxis:  heparin injection 5,000 Units Start: 11/16/21 1400 SCDs Start: 11/14/21 0830   Antimicrobials: None Fluid: None Consultants: Neurology Family Communication: None at bedside  Status is: Inpatient  Continue in-hospital care because: Multiple episode of diarrhea in last 24 hours. Level of care: Med-Surg   Dispo: The patient is from: Home              Anticipated d/c is to: SNF              Patient currently is not medically stable to d/c.   Difficult to place patient No     Infusions:    Scheduled Meds:  amLODipine  10 mg Oral Daily   aspirin  81 mg Oral Daily   atorvastatin  40 mg Oral Daily   carvedilol  6.25 mg Oral BID WC   ciprofloxacin  500 mg Oral BID   cloNIDine  0.3 mg Transdermal Weekly   empagliflozin  10 mg Oral Daily   And   linagliptin  5 mg Oral Daily   heparin injection (subcutaneous)  5,000 Units Subcutaneous Q8H   hydrALAZINE  100 mg Oral Q8H   insulin aspart  0-9 Units Subcutaneous TID WC   insulin glargine-yfgn  30 Units Subcutaneous Daily   lisinopril  10 mg Oral Daily   mouth rinse  15 mL Mouth Rinse q12n4p   metroNIDAZOLE  500 mg Oral Q12H   multivitamin with minerals  1 tablet Oral Daily    PRN meds: dextrose    Antimicrobials: Anti-infectives (From admission, onward)    Start     Dose/Rate Route Frequency Ordered Stop   11/29/21 1245  ciprofloxacin (CIPRO) tablet 500 mg        500 mg Oral 2 times daily 11/29/21 1151     11/29/21 1245  metroNIDAZOLE (FLAGYL) tablet 500 mg        500 mg Oral Every 12 hours 11/29/21 1151     11/15/21 1000  vancomycin (VANCOREADY) IVPB 750 mg/150 mL  Status:  Discontinued        750 mg 150 mL/hr over 60 Minutes Intravenous Every 24 hours 11/14/21 0556 11/16/21 1045   11/14/21 2200  ceFEPIme (MAXIPIME) 2 g in sodium chloride 0.9 % 100 mL IVPB  Status:  Discontinued        2 g 200 mL/hr over 30 Minutes Intravenous Every 12 hours 11/14/21 0556 11/16/21 1045   11/14/21 1945  acyclovir (ZOVIRAX) 600 mg in dextrose 5 % 100 mL IVPB  Status:  Discontinued        10 mg/kg  60 kg 112 mL/hr over 60 Minutes Intravenous Every 12 hours 11/14/21 1847 11/18/21 0927   11/14/21 0530  ceFEPIme (MAXIPIME) 2 g in sodium chloride 0.9 % 100 mL IVPB        2 g 200 mL/hr over 30 Minutes Intravenous  Once 11/14/21 0523 11/14/21 0633   11/14/21 0530  metroNIDAZOLE (FLAGYL) IVPB 500 mg        500 mg 100 mL/hr over 60 Minutes Intravenous  Once 11/14/21 0523 11/14/21 0713   11/14/21 0530  vancomycin (VANCOCIN) IVPB 1000 mg/200 mL premix        1,000 mg 200 mL/hr over 60 Minutes Intravenous  Once 11/14/21 0523 11/14/21 0811       Objective: Vitals:   11/29/21 0538 11/29/21 0846  BP: (!) 178/92 (!) 175/91  Pulse:  76  Resp:  18  Temp:  98.3 F (36.8 C)  SpO2:  100%    Intake/Output Summary (Last 24 hours) at 11/29/2021 1155 Last data filed at 11/28/2021 2300 Gross per 24 hour  Intake 240 ml  Output --  Net 240 ml    Filed Weights   11/16/21 0500 11/17/21 0500 11/20/21 0400  Weight: 61 kg 61 kg 67.1 kg   Weight change:  Body mass index is 23.88 kg/m.   Physical Exam: General exam: Pleasant elderly African-American male.  Not in physical distress Skin: No rashes,  lesions or ulcers. HEENT: Atraumatic, normocephalic, no obvious bleeding Lungs: Clear to auscultation bilaterally CVS: Regular rate and rhythm, no murmur GI/Abd soft, nontender, nondistended, present CNS: Alert, awake, oriented to place and person Psychiatry: Mood appropriate Extremities: No pedal edema, no calf tenderness  Data Review: I have personally reviewed the laboratory data and studies available.  F/u labs ordered Unresulted Labs (From admission, onward)     Start     Ordered   11/28/21 1018  Gastrointestinal Panel by PCR , Stool  (Gastrointestinal Panel by PCR, Stool                                                                                                                                                     **Does Not include CLOSTRIDIUM DIFFICILE testing. **If CDIFF testing is needed, place order from the "C Difficile Testing" order set.**)  Once,   R        11/28/21 1018            Signed, Lorin Glass, MD [facility] 11/29/2021

## 2021-11-30 ENCOUNTER — Inpatient Hospital Stay (HOSPITAL_COMMUNITY): Payer: Medicaid Other

## 2021-11-30 LAB — BASIC METABOLIC PANEL
Anion gap: 8 (ref 5–15)
BUN: 19 mg/dL (ref 8–23)
CO2: 23 mmol/L (ref 22–32)
Calcium: 8.3 mg/dL — ABNORMAL LOW (ref 8.9–10.3)
Chloride: 108 mmol/L (ref 98–111)
Creatinine, Ser: 1.86 mg/dL — ABNORMAL HIGH (ref 0.61–1.24)
GFR, Estimated: 40 mL/min — ABNORMAL LOW (ref >60)
Glucose, Bld: 276 mg/dL — ABNORMAL HIGH (ref 70–99)
Potassium: 4.2 mmol/L (ref 3.5–5.1)
Sodium: 139 mmol/L (ref 135–145)

## 2021-11-30 LAB — CBC WITH DIFFERENTIAL/PLATELET
Abs Immature Granulocytes: 0.16 10*3/uL — ABNORMAL HIGH (ref 0.00–0.07)
Basophils Absolute: 0 10*3/uL (ref 0.0–0.1)
Basophils Relative: 0 %
Eosinophils Absolute: 0.2 10*3/uL (ref 0.0–0.5)
Eosinophils Relative: 2 %
HCT: 28.9 % — ABNORMAL LOW (ref 39.0–52.0)
Hemoglobin: 9.2 g/dL — ABNORMAL LOW (ref 13.0–17.0)
Immature Granulocytes: 2 %
Lymphocytes Relative: 14 %
Lymphs Abs: 1.5 10*3/uL (ref 0.7–4.0)
MCH: 32.9 pg (ref 26.0–34.0)
MCHC: 31.8 g/dL (ref 30.0–36.0)
MCV: 103.2 fL — ABNORMAL HIGH (ref 80.0–100.0)
Monocytes Absolute: 1 10*3/uL (ref 0.1–1.0)
Monocytes Relative: 9 %
Neutro Abs: 8.1 10*3/uL — ABNORMAL HIGH (ref 1.7–7.7)
Neutrophils Relative %: 73 %
Platelets: 368 10*3/uL (ref 150–400)
RBC: 2.8 MIL/uL — ABNORMAL LOW (ref 4.22–5.81)
RDW: 16.6 % — ABNORMAL HIGH (ref 11.5–15.5)
WBC: 11 10*3/uL — ABNORMAL HIGH (ref 4.0–10.5)
nRBC: 0 % (ref 0.0–0.2)

## 2021-11-30 LAB — GLUCOSE, CAPILLARY
Glucose-Capillary: 281 mg/dL — ABNORMAL HIGH (ref 70–99)
Glucose-Capillary: 261 mg/dL — ABNORMAL HIGH (ref 70–99)
Glucose-Capillary: 249 mg/dL — ABNORMAL HIGH (ref 70–99)
Glucose-Capillary: 257 mg/dL — ABNORMAL HIGH (ref 70–99)

## 2021-11-30 LAB — PROCALCITONIN: Procalcitonin: <0.1 ng/mL

## 2021-11-30 LAB — RESP PANEL BY RT-PCR (FLU A&B, COVID) ARPGX2
Influenza A by PCR: NEGATIVE
Influenza B by PCR: NEGATIVE
SARS Coronavirus 2 by RT PCR: NEGATIVE

## 2021-11-30 LAB — BRAIN NATRIURETIC PEPTIDE: B Natriuretic Peptide: 648.6 pg/mL — ABNORMAL HIGH (ref 0.0–100.0)

## 2021-11-30 IMAGING — DX DG CHEST 1V PORT
1 series · 1 of 1 positions shown · non-contrast
Comparison: Portable chest [DATE] and earlier.

CLINICAL DATA: 65-year-old male with shortness of breath. Code
stroke presentation.

EXAM:
PORTABLE CHEST 1 VIEW

[chest ap]
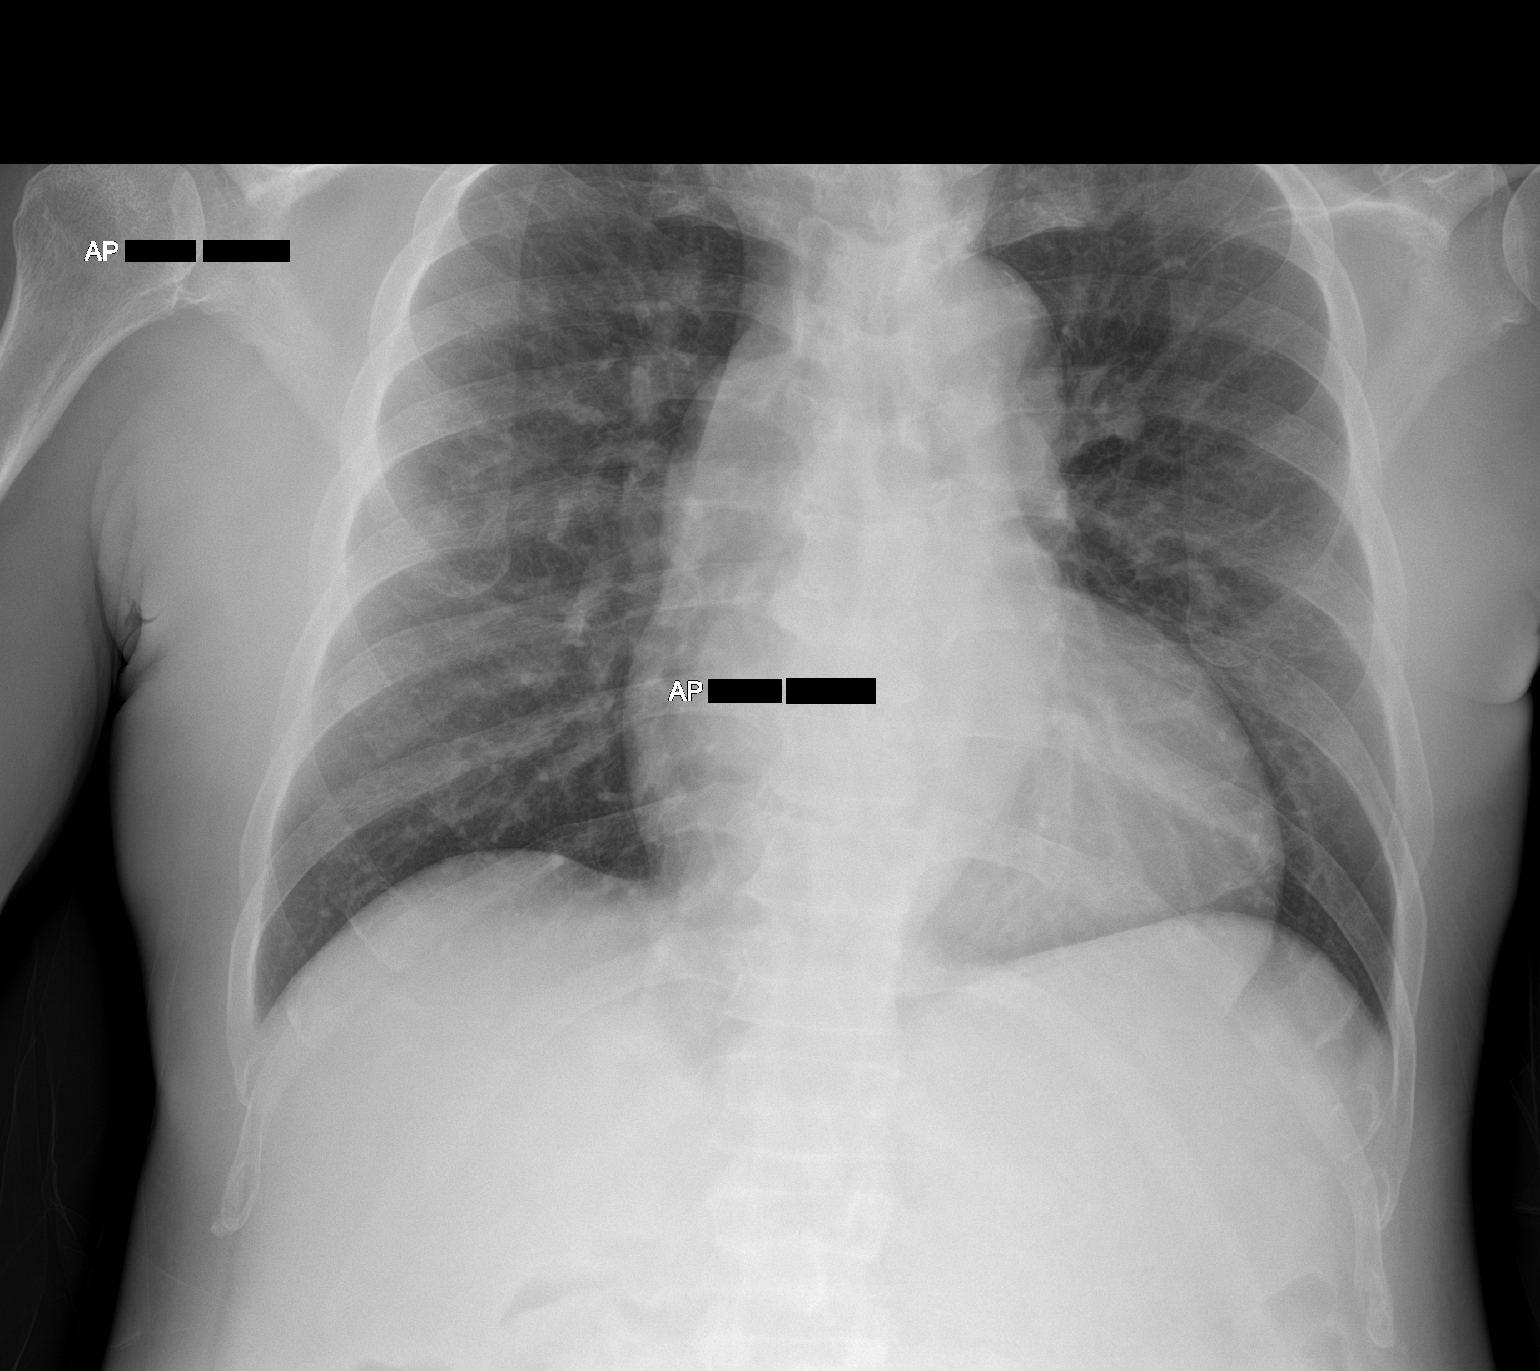

[1 of 1 positions shown; findings below may reference images not displayed]

FINDINGS: Portable AP upright view at [86] hours. Extubated. Improved lung
volumes and ventilation. Stable cardiomegaly and mediastinal
contours. Visualized tracheal air column is within normal limits.
Allowing for portable technique the lungs are clear. No acute
osseous abnormality identified. Paucity of bowel gas in the upper
abdomen.
IMPRESSION: No acute cardiopulmonary abnormality.

## 2021-11-30 MED ORDER — FUROSEMIDE 10 MG/ML IJ SOLN
40.0000 mg | Freq: Once | INTRAMUSCULAR | Status: AC
  Administered 2021-11-30: 40 mg via INTRAVENOUS
  Filled 2021-11-30: qty 4

## 2021-11-30 MED ORDER — LOPERAMIDE HCL 2 MG PO CAPS
2.0000 mg | ORAL_CAPSULE | ORAL | Status: DC | PRN
  Administered 2021-12-01 – 2021-12-03 (×5): 2 mg via ORAL
  Filled 2021-11-30 (×5): qty 1

## 2021-11-30 NOTE — Progress Notes (Signed)
PROGRESS NOTE  Manuel Kramer  DOB: 1956/07/07  PCP: Vonna Drafts, FNP [id number]  DOA: 11/14/2021  LOS: 16 days  Hospital Day: 83  Chief Complaint  Patient presents with   Altered Mental Status   Brief narrative: Manuel Kramer is a 66 y.o. male with PMH significant for DM2, HTN, chronic diarrhea who lives at home alone. Patient was brought to the ED on 11/14/2021 by EMS for altered mental status. His neighbors noted his medical alarm going off and hence EMS and police were involved.  EMS found patient agitated and combative with nonverbal status and nonpurposeful movements.  Patient was brought to the ED. In the ED he was noted to have left-sided gaze preference.  His GCS was low and needed emergent intubation.  He was bolused with Keppra, placed on nitro infusion, later on Cleviprex for hypertension. Admitted to ICU. On 1/18, MRI brain showed acute infarcts within the right frontal operculum, right internal capsule, right frontoparietal periventricular white matter and posterior right frontal lobe.  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  Mild to moderate generalized cerebral atrophy and mild cerebellar atrophy. On 1/19, extubated. On 1/21, transfer out of ICU to [facility]  Subjective: Patient seen and examined at bedside, reports diarrhea is resolving, denied any abdominal pain.  Patient reported some dyspnea upon ambulation and noted BLE edema.  Denies any chest pain, abdominal pain, nausea/vomiting, fever/chills.  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 Neurology consultation  Stroke work-up completed.  Echo with EF 60 to 65%, severe LVH, no atrial level shunt. Carotid artery  duplex without significant stenosis A1c 9.9, LDL 88 Neurology recommended aspirin 81 mg daily and Lipitor 40 mg at bedtime PT/OT/ST eval obtained. SNF recommended  Uncontrolled hypertension Patient probably had chronic uncontrolled hypertension leading to hypertensive microangiopathy, severe LVH Continue Coreg, clonidine patch, amlodipine, lisinopril and hydralazine.  Continue to monitor  Possible acute on chronic diastolic HF Reported dyspnea, with bilateral lower extremity edema On room air BNP elevated at 648 Chest x-ray, no acute cardiopulmonary abnormality Echo showed EF of 60 to 65%, no regional wall motion abnormality, grade 1 diastolic dysfunction Give a trial of Lasix and monitor Strict I's and O's, daily weights  Mild leukocytosis Currently afebrile No signs of infection Procalcitonin negative Daily CBC  Dysphagia Dysphagia 3 diet per speech.  Diarrhea History of chronic diarrhea Improving GI pathogen panel negative  Imodium as needed Continue Cipro, Flagyl, plan to DC  Uncontrolled type 2 diabetes mellitus with hyperglycemia A1c 9.9 on 11/14/2021 Home meds include Lantus 30 to 64 units at bedtime, empagliflozin-linagliptin daily Currently on Semglee 30 units daily, empagliflozin/linagliptin and sliding scale insulin with Accu-Cheks.  AKI on CKD 3B Baseline creatinine 1.4 from 2021 Monitor while on diuresis Daily BMP  Anemia of chronic kidney disease Anemia panel pending Daily BMP    Mobility: PT eval obtained. Living condition: Was at home Goals of care:   Code Status: Full Code  Nutritional status: Body mass index is 23.88 kg/m.  Nutrition Problem: Severe Malnutrition Etiology: chronic illness Signs/Symptoms: severe muscle depletion, severe fat depletion Diet:  Diet Order             DIET DYS 3 Room service appropriate? Yes with Assist; Fluid consistency: Thin  Diet effective now  DVT prophylaxis:  heparin injection  5,000 Units Start: 11/16/21 1400 SCDs Start: 11/14/21 0830   Antimicrobials: Cipro, Flagyl Fluid: None Consultants: Neurology Family Communication: None at bedside  Status is: Inpatient  Continue in-hospital care because: Level of care: Med-Surg   Dispo: The patient is from: Home              Anticipated d/c is to: SNF              Patient currently is not medically stable to d/c.   Difficult to place patient No     Infusions:    Scheduled Meds:  amLODipine  10 mg Oral Daily   aspirin  81 mg Oral Daily   atorvastatin  40 mg Oral Daily   carvedilol  6.25 mg Oral BID WC   ciprofloxacin  500 mg Oral BID   cloNIDine  0.3 mg Transdermal Weekly   empagliflozin  10 mg Oral Daily   And   linagliptin  5 mg Oral Daily   heparin injection (subcutaneous)  5,000 Units Subcutaneous Q8H   hydrALAZINE  100 mg Oral Q8H   insulin aspart  0-9 Units Subcutaneous TID WC   insulin glargine-yfgn  30 Units Subcutaneous Daily   lisinopril  10 mg Oral Daily   mouth rinse  15 mL Mouth Rinse q12n4p   metroNIDAZOLE  500 mg Oral Q12H   multivitamin with minerals  1 tablet Oral Daily    PRN meds: dextrose, loperamide   Antimicrobials: Anti-infectives (From admission, onward)    Start     Dose/Rate Route Frequency Ordered Stop   11/29/21 1300  ciprofloxacin (CIPRO) tablet 500 mg        500 mg Oral 2 times daily 11/29/21 1151     11/29/21 1300  metroNIDAZOLE (FLAGYL) tablet 500 mg        500 mg Oral Every 12 hours 11/29/21 1151     11/15/21 1000  vancomycin (VANCOREADY) IVPB 750 mg/150 mL  Status:  Discontinued        750 mg 150 mL/hr over 60 Minutes Intravenous Every 24 hours 11/14/21 0556 11/16/21 1045   11/14/21 2200  ceFEPIme (MAXIPIME) 2 g in sodium chloride 0.9 % 100 mL IVPB  Status:  Discontinued        2 g 200 mL/hr over 30 Minutes Intravenous Every 12 hours 11/14/21 0556 11/16/21 1045   11/14/21 1945  acyclovir (ZOVIRAX) 600 mg in dextrose 5 % 100 mL IVPB  Status:  Discontinued         10 mg/kg  60 kg 112 mL/hr over 60 Minutes Intravenous Every 12 hours 11/14/21 1847 11/18/21 0927   11/14/21 0530  ceFEPIme (MAXIPIME) 2 g in sodium chloride 0.9 % 100 mL IVPB        2 g 200 mL/hr over 30 Minutes Intravenous  Once 11/14/21 0523 11/14/21 0633   11/14/21 0530  metroNIDAZOLE (FLAGYL) IVPB 500 mg        500 mg 100 mL/hr over 60 Minutes Intravenous  Once 11/14/21 0523 11/14/21 0713   11/14/21 0530  vancomycin (VANCOCIN) IVPB 1000 mg/200 mL premix        1,000 mg 200 mL/hr over 60 Minutes Intravenous  Once 11/14/21 0523 11/14/21 0811       Objective: Vitals:   11/30/21 1627 11/30/21 1943  BP: (!) 179/95 (!) 147/77  Pulse: 89 89  Resp: 16 16  Temp: (!) 97.5 F (36.4 C) 98.2 F (36.8 C)  SpO2: 100% 99%  Intake/Output Summary (Last 24 hours) at 11/30/2021 2010 Last data filed at 11/30/2021 1007 Gross per 24 hour  Intake 240 ml  Output 800 ml  Net -560 ml    Filed Weights   11/16/21 0500 11/17/21 0500 11/20/21 0400  Weight: 61 kg 61 kg 67.1 kg   Weight change:  Body mass index is 23.88 kg/m.   Physical Exam: General: NAD  Cardiovascular: S1, S2 present Respiratory: CTAB Abdomen: Soft, nontender, nondistended, bowel sounds present Musculoskeletal: 1+ bilateral pedal edema noted Skin: Normal Psychiatry: Normal mood   Data Review: I have personally reviewed the laboratory data and studies available.  F/u labs ordered Unresulted Labs (From admission, onward)     Start     Ordered   12/01/21 0500  Procalcitonin  Daily,   R     Question:  Specimen collection method  Answer:  Lab=Lab collect   11/30/21 0758   11/30/21 0759  CBC with Differential/Platelet  Daily,   R     Question:  Specimen collection method  Answer:  Lab=Lab collect   11/30/21 0758   11/30/21 0000000  Basic metabolic panel  Daily,   R     Question:  Specimen collection method  Answer:  Lab=Lab collect   11/30/21 [id number]            Signed, Alma Friendly, MD [facility] 11/30/2021

## 2021-11-30 NOTE — Progress Notes (Signed)
Speech Language Pathology Treatment: Cognitive-Linquistic  Patient Details Name: Manuel Kramer MRN: [id number] DOB: 19-Apr-1956 Today's Date: 11/30/2021 Time:  -     Assessment / Plan / Recommendation Clinical Impression  Patient alert and cooperative. AM meal arriving as SLP entered room. Patient continues to present with very poor short term recall, at times repeating himself or asking the same question following only a 20-3- second delay. Reinforced education on use of writing to assist in cueing for previously taught information however did not incorporate into practice today as patient self feeding. With moderate cueing, patient able to use environment and reasoning to search to answers to repeated questions. He did demonstrate improved initiation to search environment for answers to questions.  Will benefit from ongoing therapy.    HPI HPI: Pt is a 66 year old man admitted after EMS found him unresponsive and hypotensive; Pt was intubated on 11/14/2021-11/17/2021. MRI scan revealed several small infarcts in right hemisphere.      SLP Plan  Continue with current plan of care      Recommendations for follow up therapy are one component of a multi-disciplinary discharge planning process, led by the attending physician.  Recommendations may be updated based on patient status, additional functional criteria and insurance authorization.    Recommendations                   Plan: Continue with current plan of care         [facility] MA, [place]  11/30/2021, 10:26 AM

## 2021-11-30 NOTE — Progress Notes (Signed)
Physical Therapy Treatment Patient Details Name: Manuel Kramer MRN: [id number] DOB: 1956/01/08 Today's Date: 11/30/2021   History of Present Illness 66 y/o male presented to ED on 11/14/21 after being found in his home altered and combative. Unresponsive in ED. EEG suggestive of severe encephalopathy. 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 Intubated 1/16-1/19. PMH: HTN, DM    PT Comments    Pt continues with severe STM deficits. Pt with noted bilat LE edema today and reports of scrotal pain which limited pt's ambulation tolerance and willingness to participate in standing LE strengthening exercises. Per chart and patient, patient lives alone without any support as his family all works. Pt to benefit from ST-SNF to allow for increased time to achieve safe mod I level of function for safe transition home. Acute PT to cont to follow.    Recommendations for follow up therapy are one component of a multi-disciplinary discharge planning process, led by the attending physician.  Recommendations may be updated based on patient status, additional functional criteria and insurance authorization.  Follow Up Recommendations  Skilled nursing-short term rehab (<3 hours/day)     Assistance Recommended at Discharge    Patient can return home with the following A little help with walking and/or transfers;A little help with bathing/dressing/bathroom;Direct supervision/assist for medications management;Direct supervision/assist for financial management;Assist for transportation;Help with stairs or ramp for entrance   Equipment Recommendations   (TBD at next venue)    Recommendations for Other Services       Precautions / Restrictions Precautions Precautions: Fall Restrictions Weight Bearing Restrictions: No     Mobility  Bed Mobility Overal bed  mobility: Needs Assistance Bed Mobility: Sit to Supine       Sit to supine: Min guard   General bed mobility comments: pt elevated bed because "i like it that way" and crawled into bed on hands and knees"    Transfers Overall transfer level: Needs assistance Equipment used: None Transfers: Sit to/from Stand Sit to Stand: Min guard           General transfer comment: min guard for safety, pt did push up with both hands on arm rests    Ambulation/Gait Ambulation/Gait assistance: Min assist Gait Distance (Feet): 160 Feet Assistive device: 1 person hand held assist Gait Pattern/deviations: Step-through pattern, Wide base of support Gait velocity: dec Gait velocity interpretation: <1.31 ft/sec, indicative of household ambulator   General Gait Details: pt began amb without AD, pt reaching for objects to hold onto and unsteady, grimacing in pain. Pt given R HHA. pt demo'd improved stability but continued to report scrotal pain.   Stairs             Wheelchair Mobility    Modified Rankin (Stroke Patients Only) Modified Rankin (Stroke Patients Only) Pre-Morbid Rankin Score: No symptoms Modified Rankin: Moderately severe disability     Balance Overall balance assessment: Needs assistance Sitting-balance support: Feet unsupported Sitting balance-Leahy Scale: Fair     Standing balance support: Single extremity supported, During functional activity Standing balance-Leahy Scale: Fair                              Cognition Arousal/Alertness: Awake/alert Behavior During Therapy: WFL for tasks assessed/performed Overall Cognitive Status: Impaired/Different from baseline Area of Impairment: Attention, Memory, Awareness, Problem solving, Orientation, Safety/judgement  Current Attention Level: Sustained Memory: Decreased short-term memory Following Commands: Follows one step commands with increased time, Follows multi-step commands  inconsistently Safety/Judgement: Decreased awareness of safety, Decreased awareness of deficits Awareness: Emergent Problem Solving: Slow processing, Decreased initiation General Comments: pt with noted STM, pt continues to be self limiting at time, reports "scrotal pain". Pt stopping mid exercise and returning to bed, pt raised bed super high and climbed in on hands and knees despite verbal cues from PT        Exercises General Exercises - Lower Extremity Long Arc Quad: AROM, Both, 10 reps, Seated (with manual resistance) Heel Slides: AROM, Both, 10 reps, Seated (with manual resistance) Mini-Sqauts: Other (comment) (pt refused due to "scrotal pain")    General Comments General comments (skin integrity, edema, etc.): VSS, pt asked to urinate. Pt taken into bathroom. Pt stood to urinate and supported self with L hand, pt flushed toliet 3 times while urinating, pt also with noted bilat LE edema, RN aware      Pertinent Vitals/Pain Pain Assessment Pain Assessment: Faces Faces Pain Scale: Hurts little more Pain Location: scrotum Pain Descriptors / Indicators: Discomfort, Grimacing    Home Living                          Prior Function            PT Goals (current goals can now be found in the care plan section) Progress towards PT goals: Progressing toward goals    Frequency    Min 3X/week      PT Plan Discharge plan needs to be updated;Frequency needs to be updated    Co-evaluation              AM-PAC PT "6 Clicks" Mobility   Outcome Measure  Help needed turning from your back to your side while in a flat bed without using bedrails?: A Little Help needed moving from lying on your back to sitting on the side of a flat bed without using bedrails?: A Little Help needed moving to and from a bed to a chair (including a wheelchair)?: A Little Help needed standing up from a chair using your arms (e.g., wheelchair or bedside chair)?: A Little Help needed to  walk in hospital room?: A Little Help needed climbing 3-5 steps with a railing? : A Lot 6 Click Score: 17    End of Session Equipment Utilized During Treatment: Gait belt Activity Tolerance: Patient limited by pain;Patient limited by fatigue Patient left: in bed;with call bell/phone within reach;with bed alarm set Nurse Communication: Mobility status PT Visit Diagnosis: Muscle weakness (generalized) (M62.81);Other abnormalities of gait and mobility (R26.89);Other symptoms and signs involving the nervous system (R29.898)     Time: 6834-1962 PT Time Calculation (min) (ACUTE ONLY): 21 min  Charges:  $Gait Training: 8-22 mins                     Lewis Shock, PT, DPT Acute Rehabilitation Services Pager #: [phone] Office #: [phone]    Manuel Kramer 11/30/2021, 1:04 PM

## 2021-11-30 NOTE — Progress Notes (Signed)
Occupational Therapy Treatment Patient Details Name: SEARS ORAN MRN: [id number] DOB: 03/31/1956 Today's Date: 11/30/2021   History of present illness 66 y/o male presented to ED on 11/14/21 after being found in his home altered and combative. Unresponsive in ED. EEG suggestive of severe encephalopathy. 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 Intubated 1/16-1/19. PMH: HTN, DM   OT comments  Keontay is making limited progress, he continues to be significantly limited by [facility] and impaired cognition. Overall he was supervision for all ADLs and and functional mobility with cues for safety. Pt was hyper-focused on getting back into bed and difficult to participate in therapy, however he did groom at the sink and sequenced through functional tasks well without cues. Pt continues to benefit from OT acutely. D/c to SNF remains appropriate due to safety concern related to impaired cognition. If pt had 24/7 supervision pt would be able to d/c home.    Recommendations for follow up therapy are one component of a multi-disciplinary discharge planning process, led by the attending physician.  Recommendations may be updated based on patient status, additional functional criteria and insurance authorization.    Follow Up Recommendations  Skilled nursing-short term rehab (<3 hours/day)    Assistance Recommended at Discharge Frequent or constant Supervision/Assistance  Patient can return home with the following  Direct supervision/assist for financial management;Assistance with cooking/housework;Assist for transportation;Help with stairs or ramp for entrance;A little help with walking and/or transfers;A little help with bathing/dressing/bathroom;Direct supervision/assist for medications management   Equipment Recommendations  Other (comment)       Precautions  / Restrictions Precautions Precautions: Fall Restrictions Weight Bearing Restrictions: No       Mobility Bed Mobility Overal bed mobility: Needs Assistance Bed Mobility: Supine to Sit, Sit to Supine     Supine to sit: Supervision Sit to supine: Supervision   General bed mobility comments: supervision for safety - pt is impulsive. Continues to crawl into bed, hand and knees    Transfers Overall transfer level: Needs assistance Equipment used: None Transfers: Sit to/from Stand Sit to Stand: Supervision           General transfer comment: impulsively getting OOB     Balance Overall balance assessment: Needs assistance Sitting-balance support: Feet unsupported Sitting balance-Leahy Scale: Good     Standing balance support: Single extremity supported, During functional activity Standing balance-Leahy Scale: Fair Standing balance comment: standing to urinate and standing to brush teeth at sink         ADL either performed or assessed with clinical judgement   ADL Overall ADL's : Needs assistance/impaired     Grooming: Supervision/safety;Standing Grooming Details (indicate cue type and reason): standing at the sink to brush teeth and wash face - cues to thoroughness and motivation, pt wanting to get ack into bed.                 Toilet Transfer: Supervision/safety;Ambulation Toilet Transfer Details (indicate cue type and reason): cues for safety / impulsivity Toileting- Clothing Manipulation and Hygiene: Supervision/safety;Sit to/from stand Toileting - Clothing Manipulation Details (indicate cue type and reason): stood at toilet to pee - flushed 2x     Functional mobility during ADLs: Supervision/safety;Cueing for safety;Rolling walker (2 wheels) General ADL Comments: limited effort given, pt focused on getting back to bed the entire session. supervision for all functional mobility with cues for safety and  sequencing. Encouraged pt to sit up OOB, adamantly  declining    Extremity/Trunk Assessment Upper Extremity Assessment RUE Deficits / Details: generalized weakness, ROM WFL. finger to thumb is slow and required increased cues. LUE Deficits / Details: generalized weakness. Struffling with one handed self feeding while LUE was under tray, once therapist uncovered LUE and cued him to use both hands he used LUE funcitonally.   Lower Extremity Assessment Lower Extremity Assessment: Defer to PT evaluation        Vision   Vision Assessment?: No apparent visual deficits   Perception Perception Perception: Not tested   Praxis Praxis Praxis: Not tested    Cognition Arousal/Alertness: Awake/alert Behavior During Therapy: WFL for tasks assessed/performed Overall Cognitive Status: Impaired/Different from baseline Area of Impairment: Attention, Safety/judgement, Awareness, Problem solving                 Orientation Level: Time, Disoriented to Current Attention Level: Sustained Memory: Decreased short-term memory Following Commands: Follows one step commands with increased time, Follows multi-step commands inconsistently Safety/Judgement: Decreased awareness of safety, Decreased awareness of deficits Awareness: Emergent Problem Solving: Slow processing, Decreased initiation General Comments: therapist walked into room to speak with, left and returned 5 minutes later. Pt stating he does not remeber me talking with him prior. Very little insight to safety, impulsive and requires maximal cues for safety. Sequences through funciotnal tasks well              General Comments VSS on RA    Pertinent Vitals/ Pain       Pain Assessment Pain Assessment: Faces Faces Pain Scale: No hurt Pain Intervention(s): Monitored during session   Frequency  Min 2X/week        Progress Toward Goals  OT Goals(current goals can now be found in the care plan section)  Progress towards OT goals: Progressing toward goals  Acute Rehab OT  Goals Patient Stated Goal: to go back to sleep OT Goal Formulation: With patient Time For Goal Achievement: 12/02/21 Potential to Achieve Goals: Good ADL Goals Pt Will Perform Eating: with min guard assist;sitting;with adaptive utensils Pt Will Perform Grooming: with min guard assist;sitting Pt Will Transfer to Toilet: with mod assist;stand pivot transfer;bedside commode Pt Will Perform Toileting - Clothing Manipulation and hygiene: with mod assist;sit to/from stand Additional ADL Goal #1: Pt will sit EOB at min guard assist level for approx 5 min to participate in ADL.  Plan Discharge plan remains appropriate       AM-PAC OT "6 Clicks" Daily Activity     Outcome Measure   Help from another person eating meals?: A Little Help from another person taking care of personal grooming?: A Little Help from another person toileting, which includes using toliet, bedpan, or urinal?: A Little Help from another person bathing (including washing, rinsing, drying)?: A Little Help from another person to put on and taking off regular upper body clothing?: A Little Help from another person to put on and taking off regular lower body clothing?: A Little 6 Click Score: 18    End of Session Equipment Utilized During Treatment: Rolling walker (2 wheels)  OT Visit Diagnosis: Other symptoms and signs involving the nervous system (R29.898);Other symptoms and signs involving cognitive function;Cognitive communication deficit (R41.841) Symptoms and signs involving cognitive functions: Cerebral infarction   Activity Tolerance Patient tolerated treatment well   Patient Left in bed;with call bell/phone within reach;with bed alarm set   Nurse Communication Mobility status        Time:  [id number] OT Time Calculation (min): 15 min  Charges: OT General Charges $OT Visit: 1 Visit OT Treatments $Self Care/Home Management : 8-22 mins   Jahni Nazar A Minnie Shi 11/30/2021, 2:16 PM

## 2021-11-30 NOTE — Progress Notes (Signed)
Inpatient Diabetes Program Recommendations  AACE/ADA: New Consensus Statement on Inpatient Glycemic Control (2015)  Target Ranges:  Prepandial:   less than 140 mg/dL      Peak postprandial:   less than 180 mg/dL (1-2 hours)      Critically ill patients:  140 - 180 mg/dL   Lab Results  Component Value Date   GLUCAP 281 (H) 11/30/2021   HGBA1C 9.9 (H) 11/14/2021    Review of Glycemic Control  Latest Reference Range & Units 11/29/21 06:33 11/29/21 11:41 11/29/21 17:42 11/29/21 21:06 11/30/21 08:17  Glucose-Capillary 70 - 99 mg/dL 540 (H) 981 (H) 191 (H) 239 (H) 281 (H)  (H): Data is abnormally high  Diabetes history: DM2 Outpatient Diabetes medications: Lantus 30-64 units QD??  PCP office prescribed Lantus 24 QHS Current orders for Inpatient glycemic control: Semglee 30 units QD, (increased today), Noovlog 0-9 units TID, Jardiance 10 mg QD, Tradjenta 5 mg QD  Inpatient Diabetes Program Recommendations:    Semglee 40 units QAM Novolog 2 units TID with meals Add carb modified to diet  Will continue to follow while inpatient.  Thank you, Dulce Sellar, MSN, RN Diabetes Coordinator Inpatient Diabetes Program [phone] (team pager from 8a-5p)

## 2021-11-30 NOTE — TOC Progression Note (Signed)
Transition of Care [facility]) - Progression Note    Patient Details  Name: Manuel Kramer MRN: [id number] Date of Birth: [birth year]  [facility] of [facility]) CM/SW Contact  Kermit Balo, RN Phone Number: 11/30/2021, 4:02 PM  Clinical Narrative:    Patient has auth and bed at [facility] Not medically ready per MD for d/c today. CM has updated the family. Daughter was supposed to sign paperwork at [facility] today to hold the bed. She did not show up. CM has encouraged her to sign paperwork tomorrow. [name] will not hold the bed past tomorrow.  TOC following.   Expected Discharge Plan: Skilled Nursing Facility Barriers to Discharge: English as a second language teacher  Expected Discharge Plan and Services Expected Discharge Plan: Skilled Nursing Facility In-house Referral: Clinical Social Work Discharge Planning Services: CM Consult Post Acute Care Choice: Skilled Nursing Facility Living arrangements for the past 2 months: Single Family Home                                       Social Determinants of Health (SDOH) Interventions    Readmission Risk Interventions No flowsheet data found.

## 2021-11-30 NOTE — Plan of Care (Signed)
°  Problem: Elimination: Goal: Will not experience complications related to bowel motility Outcome: Progressing   Problem: Nutrition: Goal: Dietary intake will improve Outcome: Progressing   Problem: Ischemic Stroke/TIA Tissue Perfusion: Goal: Complications of ischemic stroke/TIA will be minimized Outcome: Progressing

## 2021-12-01 LAB — BASIC METABOLIC PANEL
Anion gap: 10 (ref 5–15)
BUN: 21 mg/dL (ref 8–23)
CO2: 23 mmol/L (ref 22–32)
Calcium: 8.5 mg/dL — ABNORMAL LOW (ref 8.9–10.3)
Chloride: 108 mmol/L (ref 98–111)
Creatinine, Ser: 1.76 mg/dL — ABNORMAL HIGH (ref 0.61–1.24)
GFR, Estimated: 43 mL/min — ABNORMAL LOW (ref >60)
Glucose, Bld: 219 mg/dL — ABNORMAL HIGH (ref 70–99)
Potassium: 4.1 mmol/L (ref 3.5–5.1)
Sodium: 141 mmol/L (ref 135–145)

## 2021-12-01 LAB — GLUCOSE, CAPILLARY
Glucose-Capillary: 225 mg/dL — ABNORMAL HIGH (ref 70–99)
Glucose-Capillary: 289 mg/dL — ABNORMAL HIGH (ref 70–99)
Glucose-Capillary: 174 mg/dL — ABNORMAL HIGH (ref 70–99)
Glucose-Capillary: 241 mg/dL — ABNORMAL HIGH (ref 70–99)

## 2021-12-01 LAB — CBC WITH DIFFERENTIAL/PLATELET
Abs Immature Granulocytes: 0.14 10*3/uL — ABNORMAL HIGH (ref 0.00–0.07)
Basophils Absolute: 0 10*3/uL (ref 0.0–0.1)
Basophils Relative: 0 %
Eosinophils Absolute: 0.3 10*3/uL (ref 0.0–0.5)
Eosinophils Relative: 3 %
HCT: 27.1 % — ABNORMAL LOW (ref 39.0–52.0)
Hemoglobin: 8.3 g/dL — ABNORMAL LOW (ref 13.0–17.0)
Immature Granulocytes: 1 %
Lymphocytes Relative: 14 %
Lymphs Abs: 1.5 10*3/uL (ref 0.7–4.0)
MCH: 32.3 pg (ref 26.0–34.0)
MCHC: 30.6 g/dL (ref 30.0–36.0)
MCV: 105.4 fL — ABNORMAL HIGH (ref 80.0–100.0)
Monocytes Absolute: 1.2 10*3/uL — ABNORMAL HIGH (ref 0.1–1.0)
Monocytes Relative: 11 %
Neutro Abs: 7.4 10*3/uL (ref 1.7–7.7)
Neutrophils Relative %: 71 %
Platelets: 320 10*3/uL (ref 150–400)
RBC: 2.57 MIL/uL — ABNORMAL LOW (ref 4.22–5.81)
RDW: 17.2 % — ABNORMAL HIGH (ref 11.5–15.5)
WBC: 10.5 10*3/uL (ref 4.0–10.5)
nRBC: 0 % (ref 0.0–0.2)

## 2021-12-01 LAB — IRON AND TIBC
Iron: UNDETERMINED ug/dL (ref 45–182)
Saturation Ratios: UNDETERMINED % (ref 17.9–39.5)
TIBC: UNDETERMINED ug/dL (ref 250–450)
UIBC: UNDETERMINED ug/dL

## 2021-12-01 LAB — FOLATE: Folate: 7 ng/mL (ref >5.9)

## 2021-12-01 LAB — FERRITIN: Ferritin: 308 ng/mL (ref 24–336)

## 2021-12-01 LAB — VITAMIN B12: Vitamin B-12: 611 pg/mL (ref 180–914)

## 2021-12-01 MED ORDER — HYDRALAZINE HCL 20 MG/ML IJ SOLN
5.0000 mg | INTRAMUSCULAR | Status: DC | PRN
  Administered 2021-12-01 – 2021-12-04 (×3): 5 mg via INTRAVENOUS
  Filled 2021-12-01 (×3): qty 1

## 2021-12-01 MED ORDER — FUROSEMIDE 10 MG/ML IJ SOLN
40.0000 mg | Freq: Once | INTRAMUSCULAR | Status: AC
  Administered 2021-12-01: 40 mg via INTRAVENOUS
  Filled 2021-12-01: qty 4

## 2021-12-01 MED ORDER — FUROSEMIDE 10 MG/ML IJ SOLN
40.0000 mg | Freq: Two times a day (BID) | INTRAMUSCULAR | Status: DC
  Administered 2021-12-01 – 2021-12-02 (×2): 40 mg via INTRAVENOUS
  Filled 2021-12-01 (×2): qty 4

## 2021-12-01 MED ORDER — INSULIN GLARGINE-YFGN 100 UNIT/ML ~~LOC~~ SOLN
40.0000 [IU] | Freq: Every day | SUBCUTANEOUS | Status: DC
  Administered 2021-12-02 – 2021-12-05 (×4): 40 [IU] via SUBCUTANEOUS
  Filled 2021-12-01 (×4): qty 0.4

## 2021-12-01 NOTE — Plan of Care (Signed)
Pt is alert oriented x 3, pt ambulatory. Pts BP has been 170s-180s systolic tonight. On call [facility] provider paged to inform. Received a PRN order for hypertension, BP greater than 160s'. Given once, tonight.  Problem: Clinical Measurements: Goal: Ability to maintain clinical measurements within normal limits will improve Outcome: Progressing Goal: Will remain free from infection Outcome: Progressing Goal: Diagnostic test results will improve Outcome: Progressing Goal: Respiratory complications will improve Outcome: Progressing Goal: Cardiovascular complication will be avoided Outcome: Progressing   Problem: Nutrition: Goal: Adequate nutrition will be maintained Outcome: Progressing   Problem: Elimination: Goal: Will not experience complications related to bowel motility Outcome: Progressing Goal: Will not experience complications related to urinary retention Outcome: Progressing   Problem: Safety: Goal: Ability to remain free from injury will improve Outcome: Progressing   Problem: Skin Integrity: Goal: Risk for impaired skin integrity will decrease Outcome: Progressing   Problem: Education: Goal: Knowledge of disease or condition will improve Outcome: Progressing Goal: Knowledge of secondary prevention will improve (SELECT ALL) Outcome: Progressing Goal: Knowledge of patient specific risk factors will improve (INDIVIDUALIZE FOR PATIENT) Outcome: Progressing Goal: Individualized Educational Video(s) Outcome: Progressing   Problem: Coping: Goal: Will verbalize positive feelings about self Outcome: Progressing Goal: Will identify appropriate support needs Outcome: Progressing   Problem: Health Behavior/Discharge Planning: Goal: Ability to manage health-related needs will improve Outcome: Progressing   Problem: Self-Care: Goal: Ability to participate in self-care as condition permits will improve Outcome: Progressing Goal: Verbalization of feelings and concerns  over difficulty with self-care will improve Outcome: Progressing Goal: Ability to communicate needs accurately will improve Outcome: Progressing   Problem: Nutrition: Goal: Risk of aspiration will decrease Outcome: Progressing Goal: Dietary intake will improve Outcome: Progressing   Problem: Ischemic Stroke/TIA Tissue Perfusion: Goal: Complications of ischemic stroke/TIA will be minimized Outcome: Progressing

## 2021-12-01 NOTE — Progress Notes (Signed)
PROGRESS NOTE  Manuel Kramer  DOB: [birth year]  PCP: Vonna Drafts, FNP [id number]  DOA: 11/14/2021  LOS: 17 days  Hospital Day: 97  Chief Complaint  Patient presents with   Altered Mental Status   Brief narrative: Manuel Kramer is a 66 y.o. male with PMH significant for DM2, HTN, chronic diarrhea who lives at home alone. Patient was brought to the ED on 11/14/2021 by EMS for altered mental status. His neighbors noted his medical alarm going off and hence EMS and police were involved.  EMS found patient agitated and combative with nonverbal status and nonpurposeful movements.  Patient was brought to the ED. In the ED he was noted to have left-sided gaze preference.  His GCS was low and needed emergent intubation.  He was bolused with Keppra, placed on nitro infusion, later on Cleviprex for hypertension. Admitted to ICU. On 1/18, MRI brain showed acute infarcts within the right frontal operculum, right internal capsule, right frontoparietal periventricular white matter and posterior right frontal lobe.  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  Mild to moderate generalized cerebral atrophy and mild cerebellar atrophy. On 1/19, extubated. On 1/21, transfer out of ICU to [facility]    Subjective: Patient seen and examined at bedside, still having diarrhea, but has reduced in frequency. 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 Neurology consultation  Stroke work-up completed.  Echo with EF 60 to 65%, severe LVH, no atrial level shunt. Carotid artery duplex without significant stenosis A1c 9.9, LDL 88 Neurology recommended  aspirin 81 mg daily and Lipitor 40 mg at bedtime PT/OT/ST eval obtained. SNF recommended  Uncontrolled hypertension Patient probably had chronic uncontrolled hypertension leading to hypertensive microangiopathy, severe LVH Continue Coreg, clonidine patch, amlodipine, lisinopril and hydralazine Continue to monitor  Possible acute on chronic diastolic HF Reported dyspnea, with bilateral lower extremity edema On room air BNP elevated at 648 Chest x-ray, no acute cardiopulmonary abnormality Echo showed EF of 60 to 65%, no regional wall motion abnormality, grade 1 diastolic dysfunction Continue Lasix and monitor bmp closely Strict I's and O's, daily weights  Mild leukocytosis Resolved Currently afebrile No signs of infection Procalcitonin negative Daily CBC  Dysphagia Dysphagia 3 diet per speech.  Diarrhea History of chronic diarrhea Improving GI pathogen panel negative  Imodium as needed Continue Cipro, Flagyl, plan to DC after 5 days total or earlier  Uncontrolled type 2 diabetes mellitus with hyperglycemia A1c 9.9 on 11/14/2021 Home meds include Lantus 30 to 64 units at bedtime, empagliflozin-linagliptin daily Currently on Semglee 40 units daily, empagliflozin/linagliptin and sliding scale insulin with Accu-Cheks.  AKI on CKD 3B Baseline creatinine 1.4 from 2021 Monitor while on diuresis Daily BMP  Anemia of chronic kidney disease Anemia panel pending, ferritin 308, folate 7 Daily CBC    Mobility: PT eval obtained. Living condition: Was at home Goals of care:   Code Status: Full Code  Nutritional status: Body mass index is 22.77 kg/m.  Nutrition Problem: Severe Malnutrition Etiology: chronic illness Signs/Symptoms: severe muscle depletion, severe fat depletion Diet:  Diet Order             DIET DYS 3 Room service appropriate? Yes with Assist; Fluid consistency: Thin  Diet effective now  DVT prophylaxis:  heparin injection 5,000  Units Start: 11/16/21 1400 SCDs Start: 11/14/21 0830   Antimicrobials: Cipro, Flagyl Fluid: None Consultants: Neurology Family Communication: None at bedside  Status is: Inpatient  Continue in-hospital care because: Level of care: Med-Surg   Dispo: The patient is from: Home              Anticipated d/c is to: SNF              Patient currently is not medically stable to d/c.   Difficult to place patient No     Infusions:    Scheduled Meds:  amLODipine  10 mg Oral Daily   aspirin  81 mg Oral Daily   atorvastatin  40 mg Oral Daily   carvedilol  6.25 mg Oral BID WC   ciprofloxacin  500 mg Oral BID   cloNIDine  0.3 mg Transdermal Weekly   empagliflozin  10 mg Oral Daily   And   linagliptin  5 mg Oral Daily   furosemide  40 mg Intravenous BID   heparin injection (subcutaneous)  5,000 Units Subcutaneous Q8H   hydrALAZINE  100 mg Oral Q8H   insulin aspart  0-9 Units Subcutaneous TID WC   [START ON 12/02/2021] insulin glargine-yfgn  40 Units Subcutaneous Daily   lisinopril  10 mg Oral Daily   mouth rinse  15 mL Mouth Rinse q12n4p   metroNIDAZOLE  500 mg Oral Q12H   multivitamin with minerals  1 tablet Oral Daily    PRN meds: dextrose, hydrALAZINE, loperamide   Antimicrobials: Anti-infectives (From admission, onward)    Start     Dose/Rate Route Frequency Ordered Stop   11/29/21 1300  ciprofloxacin (CIPRO) tablet 500 mg        500 mg Oral 2 times daily 11/29/21 1151     11/29/21 1300  metroNIDAZOLE (FLAGYL) tablet 500 mg        500 mg Oral Every 12 hours 11/29/21 1151     11/15/21 1000  vancomycin (VANCOREADY) IVPB 750 mg/150 mL  Status:  Discontinued        750 mg 150 mL/hr over 60 Minutes Intravenous Every 24 hours 11/14/21 0556 11/16/21 1045   11/14/21 2200  ceFEPIme (MAXIPIME) 2 g in sodium chloride 0.9 % 100 mL IVPB  Status:  Discontinued        2 g 200 mL/hr over 30 Minutes Intravenous Every 12 hours 11/14/21 0556 11/16/21 1045   11/14/21 1945  acyclovir  (ZOVIRAX) 600 mg in dextrose 5 % 100 mL IVPB  Status:  Discontinued        10 mg/kg  60 kg 112 mL/hr over 60 Minutes Intravenous Every 12 hours 11/14/21 1847 11/18/21 0927   11/14/21 0530  ceFEPIme (MAXIPIME) 2 g in sodium chloride 0.9 % 100 mL IVPB        2 g 200 mL/hr over 30 Minutes Intravenous  Once 11/14/21 0523 11/14/21 0633   11/14/21 0530  metroNIDAZOLE (FLAGYL) IVPB 500 mg        500 mg 100 mL/hr over 60 Minutes Intravenous  Once 11/14/21 0523 11/14/21 0713   11/14/21 0530  vancomycin (VANCOCIN) IVPB 1000 mg/200 mL premix        1,000 mg 200 mL/hr over 60 Minutes Intravenous  Once 11/14/21 0523 11/14/21 0811       Objective: Vitals:   12/01/21 1136 12/01/21 1502  BP: (!) 166/89 (!) 162/81  Pulse: 86 84  Resp: 18 16  Temp: 98.3 F (36.8  C) 98.1 F (36.7 C)  SpO2:  100%    Intake/Output Summary (Last 24 hours) at 12/01/2021 1543 Last data filed at 12/01/2021 1100 Gross per 24 hour  Intake 660 ml  Output 375 ml  Net 285 ml    Filed Weights   11/17/21 0500 11/20/21 0400 12/01/21 0253  Weight: 61 kg 67.1 kg 64 kg   Weight change:  Body mass index is 22.77 kg/m.   Physical Exam: General: NAD  Cardiovascular: S1, S2 present Respiratory: CTAB Abdomen: Soft, nontender, nondistended, bowel sounds present Musculoskeletal: 2+ bilateral pedal edema noted Skin: Normal Psychiatry: Normal mood   Data Review: I have personally reviewed the laboratory data and studies available.  F/u labs ordered Unresulted Labs (From admission, onward)     Start     Ordered   12/02/21 0500  Iron and TIBC  Tomorrow morning,   R       Question:  Specimen collection method  Answer:  Lab=Lab collect   12/01/21 0814   11/30/21 0759  CBC with Differential/Platelet  Daily,   R     Question:  Specimen collection method  Answer:  Lab=Lab collect   11/30/21 0758   11/30/21 0000000  Basic metabolic panel  Daily,   R     Question:  Specimen collection method  Answer:  Lab=Lab collect    11/30/21 [id number]            Signed, Alma Friendly, MD [facility] 12/01/2021

## 2021-12-01 NOTE — Progress Notes (Signed)
Inpatient Diabetes Program Recommendations  AACE/ADA: New Consensus Statement on Inpatient Glycemic Control (2015)  Target Ranges:  Prepandial:   less than 140 mg/dL      Peak postprandial:   less than 180 mg/dL (1-2 hours)      Critically ill patients:  140 - 180 mg/dL   Lab Results  Component Value Date   GLUCAP 225 (H) 12/01/2021   HGBA1C 9.9 (H) 11/14/2021    Review of Glycemic Control  Latest Reference Range & Units 11/30/21 08:17 11/30/21 11:42 11/30/21 16:36 11/30/21 21:15 12/01/21 06:09  Glucose-Capillary 70 - 99 mg/dL 267 (H) 124 (H) 580 (H) 257 (H) 225 (H)  (H): Data is abnormally high  Diabetes history: DM2 Outpatient Diabetes medications: Lantus 30-64 units QD??  PCP office prescribed Lantus 24 QHS Current orders for Inpatient glycemic control: Semglee 30 units QD, Novolog 0-9 units TID, Jardiance 10 mg QD, Tradjenta 5 mg QD   Inpatient Diabetes Program Recommendations:     Semglee 40 units QAM Add carb modified to diet   Will continue to follow while inpatient.   Thank you, Dulce Sellar, MSN, RN Diabetes Coordinator Inpatient Diabetes Program [phone] (team pager from 8a-5p)

## 2021-12-02 ENCOUNTER — Inpatient Hospital Stay (HOSPITAL_COMMUNITY): Payer: Medicaid Other

## 2021-12-02 LAB — CBC WITH DIFFERENTIAL/PLATELET
Abs Immature Granulocytes: 0.09 10*3/uL — ABNORMAL HIGH (ref 0.00–0.07)
Basophils Absolute: 0 10*3/uL (ref 0.0–0.1)
Basophils Relative: 0 %
Eosinophils Absolute: 0.3 10*3/uL (ref 0.0–0.5)
Eosinophils Relative: 3 %
HCT: 27.8 % — ABNORMAL LOW (ref 39.0–52.0)
Hemoglobin: 8.9 g/dL — ABNORMAL LOW (ref 13.0–17.0)
Immature Granulocytes: 1 %
Lymphocytes Relative: 18 %
Lymphs Abs: 1.7 10*3/uL (ref 0.7–4.0)
MCH: 33.2 pg (ref 26.0–34.0)
MCHC: 32 g/dL (ref 30.0–36.0)
MCV: 103.7 fL — ABNORMAL HIGH (ref 80.0–100.0)
Monocytes Absolute: 1 10*3/uL (ref 0.1–1.0)
Monocytes Relative: 10 %
Neutro Abs: 6.5 10*3/uL (ref 1.7–7.7)
Neutrophils Relative %: 68 %
Platelets: 304 10*3/uL (ref 150–400)
RBC: 2.68 MIL/uL — ABNORMAL LOW (ref 4.22–5.81)
RDW: 16.8 % — ABNORMAL HIGH (ref 11.5–15.5)
WBC: 9.6 10*3/uL (ref 4.0–10.5)
nRBC: 0 % (ref 0.0–0.2)

## 2021-12-02 LAB — GLUCOSE, CAPILLARY
Glucose-Capillary: 145 mg/dL — ABNORMAL HIGH (ref 70–99)
Glucose-Capillary: 140 mg/dL — ABNORMAL HIGH (ref 70–99)
Glucose-Capillary: 164 mg/dL — ABNORMAL HIGH (ref 70–99)
Glucose-Capillary: 219 mg/dL — ABNORMAL HIGH (ref 70–99)
Glucose-Capillary: 304 mg/dL — ABNORMAL HIGH (ref 70–99)

## 2021-12-02 LAB — BASIC METABOLIC PANEL
Anion gap: 10 (ref 5–15)
BUN: 22 mg/dL (ref 8–23)
CO2: 23 mmol/L (ref 22–32)
Calcium: 8.5 mg/dL — ABNORMAL LOW (ref 8.9–10.3)
Chloride: 108 mmol/L (ref 98–111)
Creatinine, Ser: 1.91 mg/dL — ABNORMAL HIGH (ref 0.61–1.24)
GFR, Estimated: 39 mL/min — ABNORMAL LOW (ref >60)
Glucose, Bld: 201 mg/dL — ABNORMAL HIGH (ref 70–99)
Potassium: 3.9 mmol/L (ref 3.5–5.1)
Sodium: 141 mmol/L (ref 135–145)

## 2021-12-02 LAB — IRON AND TIBC
Iron: 78 ug/dL (ref 45–182)
Saturation Ratios: 33 % (ref 17.9–39.5)
TIBC: 238 ug/dL — ABNORMAL LOW (ref 250–450)
UIBC: 160 ug/dL

## 2021-12-02 IMAGING — MR MR HEAD W/O CM
12 of 13 series · 44 of 48 positions shown · non-contrast
Comparison: Brain MRI and intracranial MRA [DATE], [DATE].

CLINICAL DATA: 65-year-old male found unresponsive with
hypotension. Several scattered lacunar infarcts in the right MCA
territory last month on MRI.

EXAM:
MRI HEAD WITHOUT CONTRAST
TECHNIQUE: Multiplanar, multiecho pulse sequences of the brain and surrounding
structures were obtained without intravenous contrast.

[Series 5: DWI · axial · 3.0mm · 0.92mm/px · z∈[-81,+82]mm · 8 of 111 slices shown (1 of 4)]
[im 1/111]
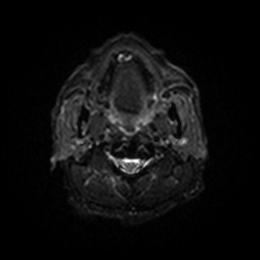
[im 16/111]
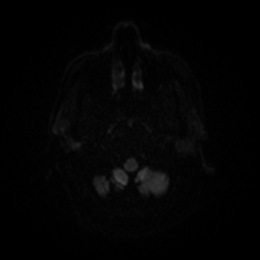
[im 32/111]
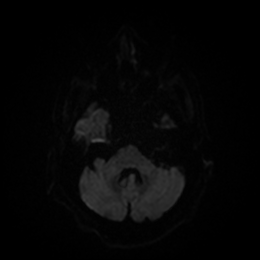
[im 48/111]
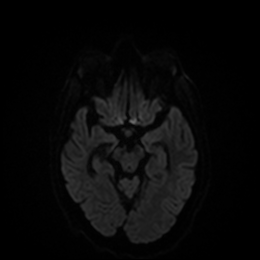
[im 63/111]
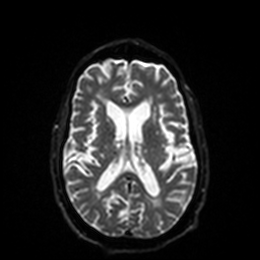
[im 79/111]
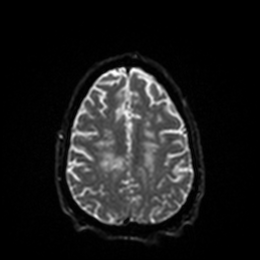
[im 95/111]
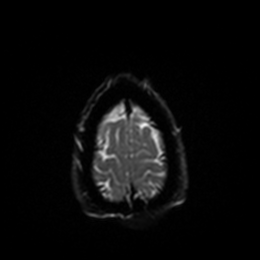
[im 111/111]
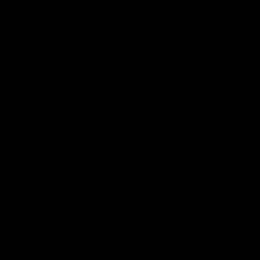

[Series 6: DWI · axial · 3.0mm · 0.92mm/px · z∈[-81,+79]mm · 4 of 53 slices shown (2 of 4)]
[im 1/53]
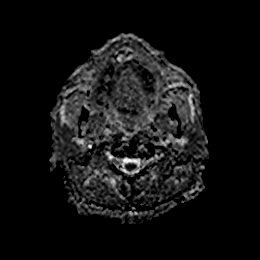
[im 18/53]
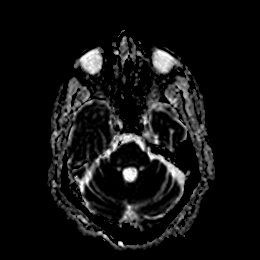
[im 35/53]
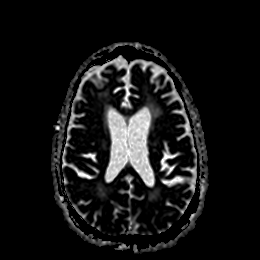
[im 53/53]
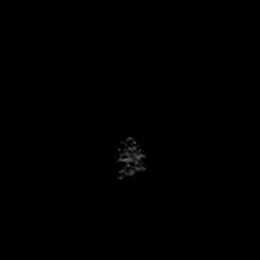

[Series 7: DWI · coronal · 4.0mm · 0.88mm/px · 6 of 84 slices shown (3 of 4)]
[im 1/84]
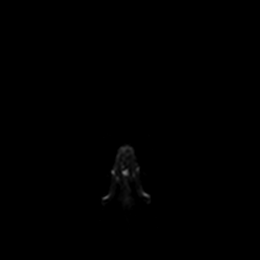
[im 17/84]
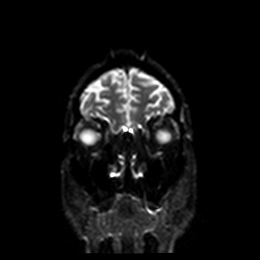
[im 34/84]
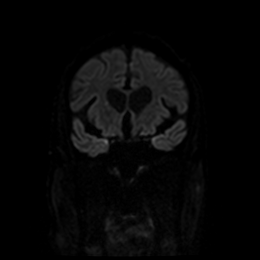
[im 50/84]
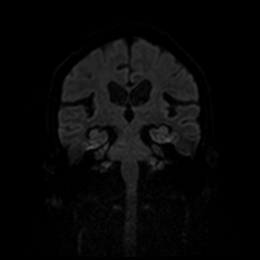
[im 67/84]
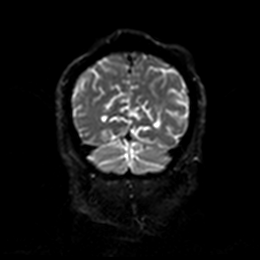
[im 84/84]
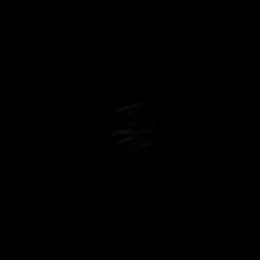

[Series 8: DWI · coronal · 4.0mm · 0.88mm/px · 3 of 42 slices shown (4 of 4)]
[im 1/42]
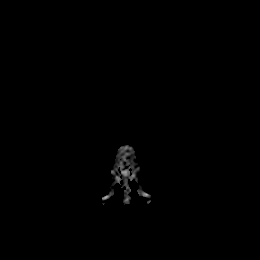
[im 21/42]
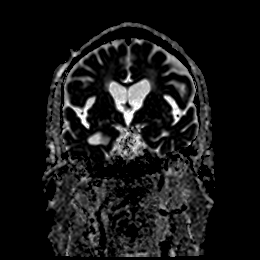
[im 42/42]
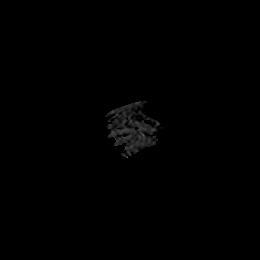

[Series 9: T1 · sagittal · 5.0mm · 0.75mm/px · 2 of 27 slices shown]
[im 1/27]
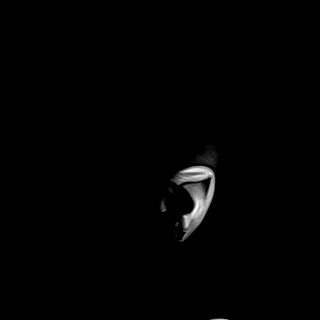
[im 27/27]
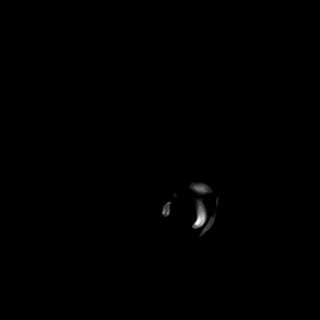

[Series 10: T2 · axial · 5.0mm · 0.75mm/px · z∈[-79,+81]mm · 2 of 28 slices shown (1 of 2)]
[im 1/28]
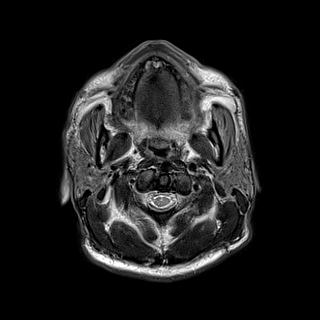
[im 28/28]
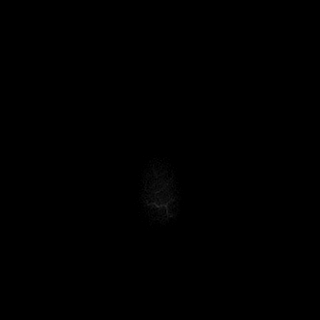

[Series 11: FLAIR · axial · 5.0mm · 0.47mm/px · z∈[-86,+80]mm · 2 of 29 slices shown]
[im 1/29]
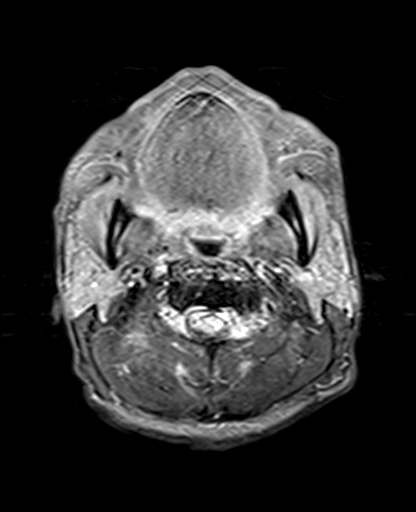
[im 29/29]
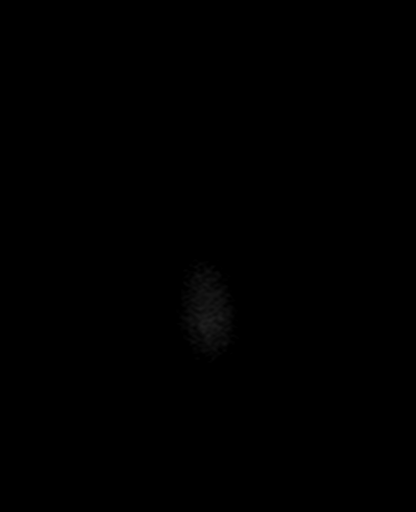

[Series 12: mag_images · axial · 3.0mm · 0.94mm/px · z∈[-82,+82]mm · 4 of 56 slices shown]
[im 1/56]
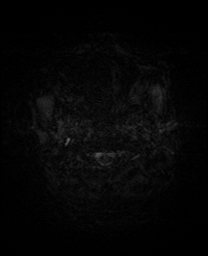
[im 19/56]
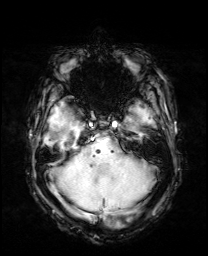
[im 37/56]
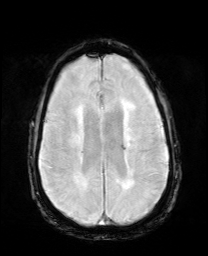
[im 56/56]
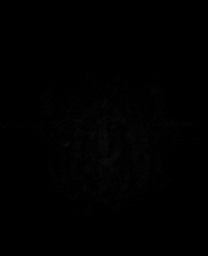

[Series 13: pha_images · axial · 3.0mm · 0.94mm/px · z∈[-79,+76]mm · 4 of 53 slices shown]
[im 1/53]
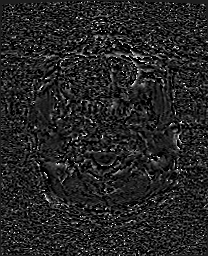
[im 18/53]
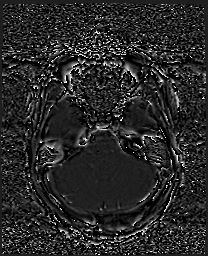
[im 35/53]
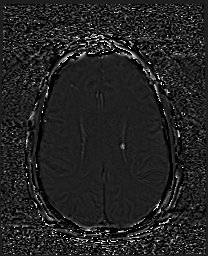
[im 53/53]
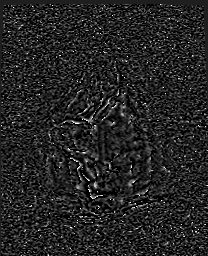

[Series 14: swi_images · axial · 3.0mm · 0.94mm/px · z∈[-82,+82]mm · 4 of 56 slices shown]
[im 1/56]
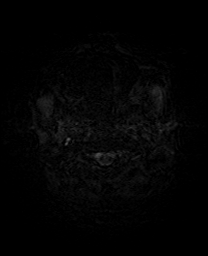
[im 19/56]
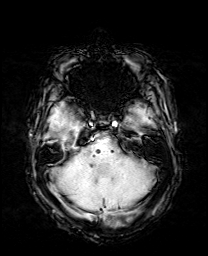
[im 37/56]
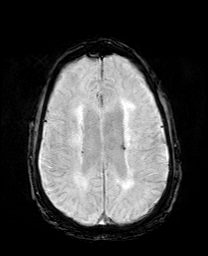
[im 56/56]
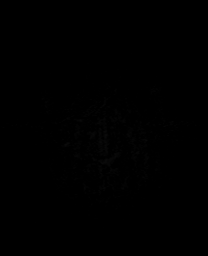

[Series 15: mip_images(sw) · axial · 24.0mm · 0.94mm/px · z∈[-71,+71]mm · 3 of 49 slices shown]
[im 1/49]
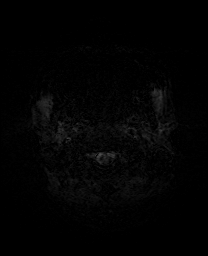
[im 25/49]
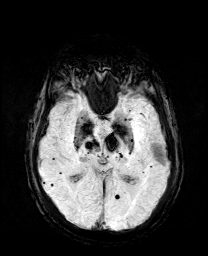
[im 49/49]
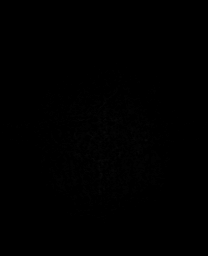

[Series 17: T2 · coronal · 5.0mm · 0.34mm/px · 2 of 35 slices shown (2 of 2)]
[im 1/35]
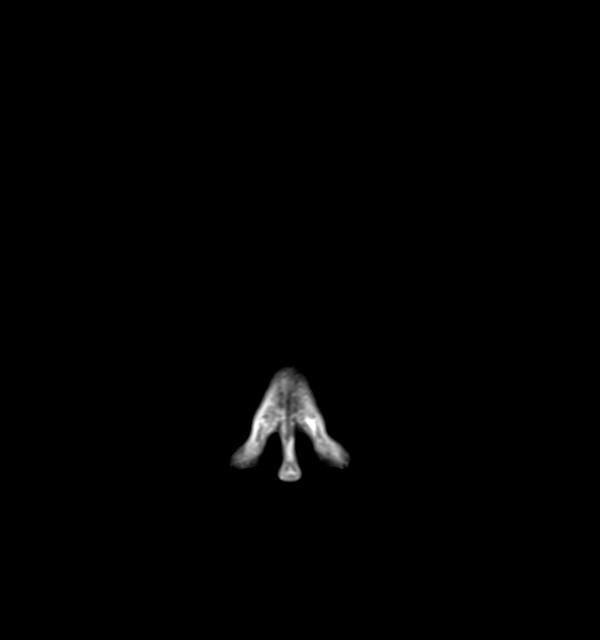
[im 35/35]
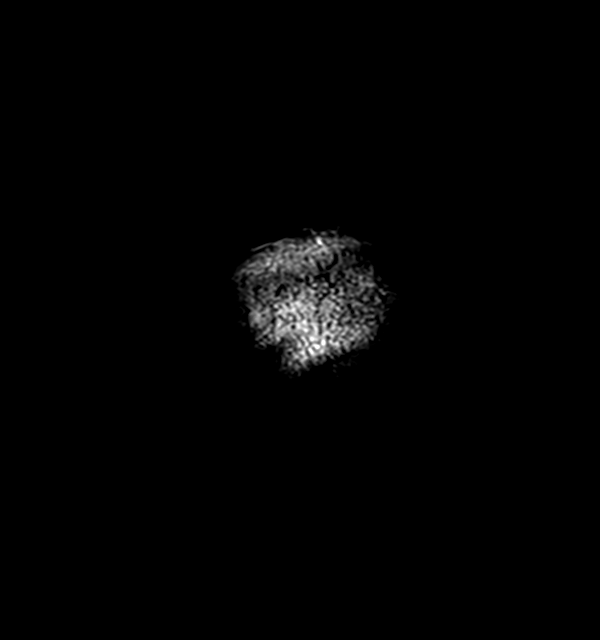

[44 of 48 positions shown; findings below may reference images not displayed]

FINDINGS: Brain: Extensive chronic microhemorrhages concentrated in the
bilateral deep gray matter nuclei, moderately affecting the
brainstem, and scattered elsewhere in the bilateral cerebral and
cerebellar hemispheres. Extent is less than typical of amyloid
angiopathy.

Associated hemosiderin susceptibility on DWI. No restricted
diffusion to suggest acute infarction. No midline shift, mass
effect, evidence of mass lesion, ventriculomegaly, extra-axial
collection or acute intracranial hemorrhage. Cervicomedullary
junction and pituitary are within normal limits.

Chronic lacunar infarcts in the bilateral corona radiata, deep gray
matter nuclei, right pons. No cortical encephalomalacia or new
signal abnormality identified.

Vascular: Major intracranial vascular flow voids are stable.

Skull and upper cervical spine: Negative. Visualized bone marrow
signal is within normal limits.

Sinuses/Orbits: Stable, negative.

Other: Trace mastoid fluid is stable and layering secretions in the
nasopharynx have resolved from last month at which time the patient
was intubated. Superficial dermal cyst overlying the right mandible
appears benign.
IMPRESSION: 1. No acute intracranial abnormality.
2. Extensive chronic small vessel disease with numerous chronic
micro-hemorrhages concentrated in the deep gray matter nuclei and
brainstem.

## 2021-12-02 IMAGING — DX DG ABD PORTABLE 1V
1 series · 1 of 1 positions shown · non-contrast
Comparison: [DATE].

CLINICAL DATA: Diarrhea.

EXAM:
PORTABLE ABDOMEN - 1 VIEW

[abdomen]
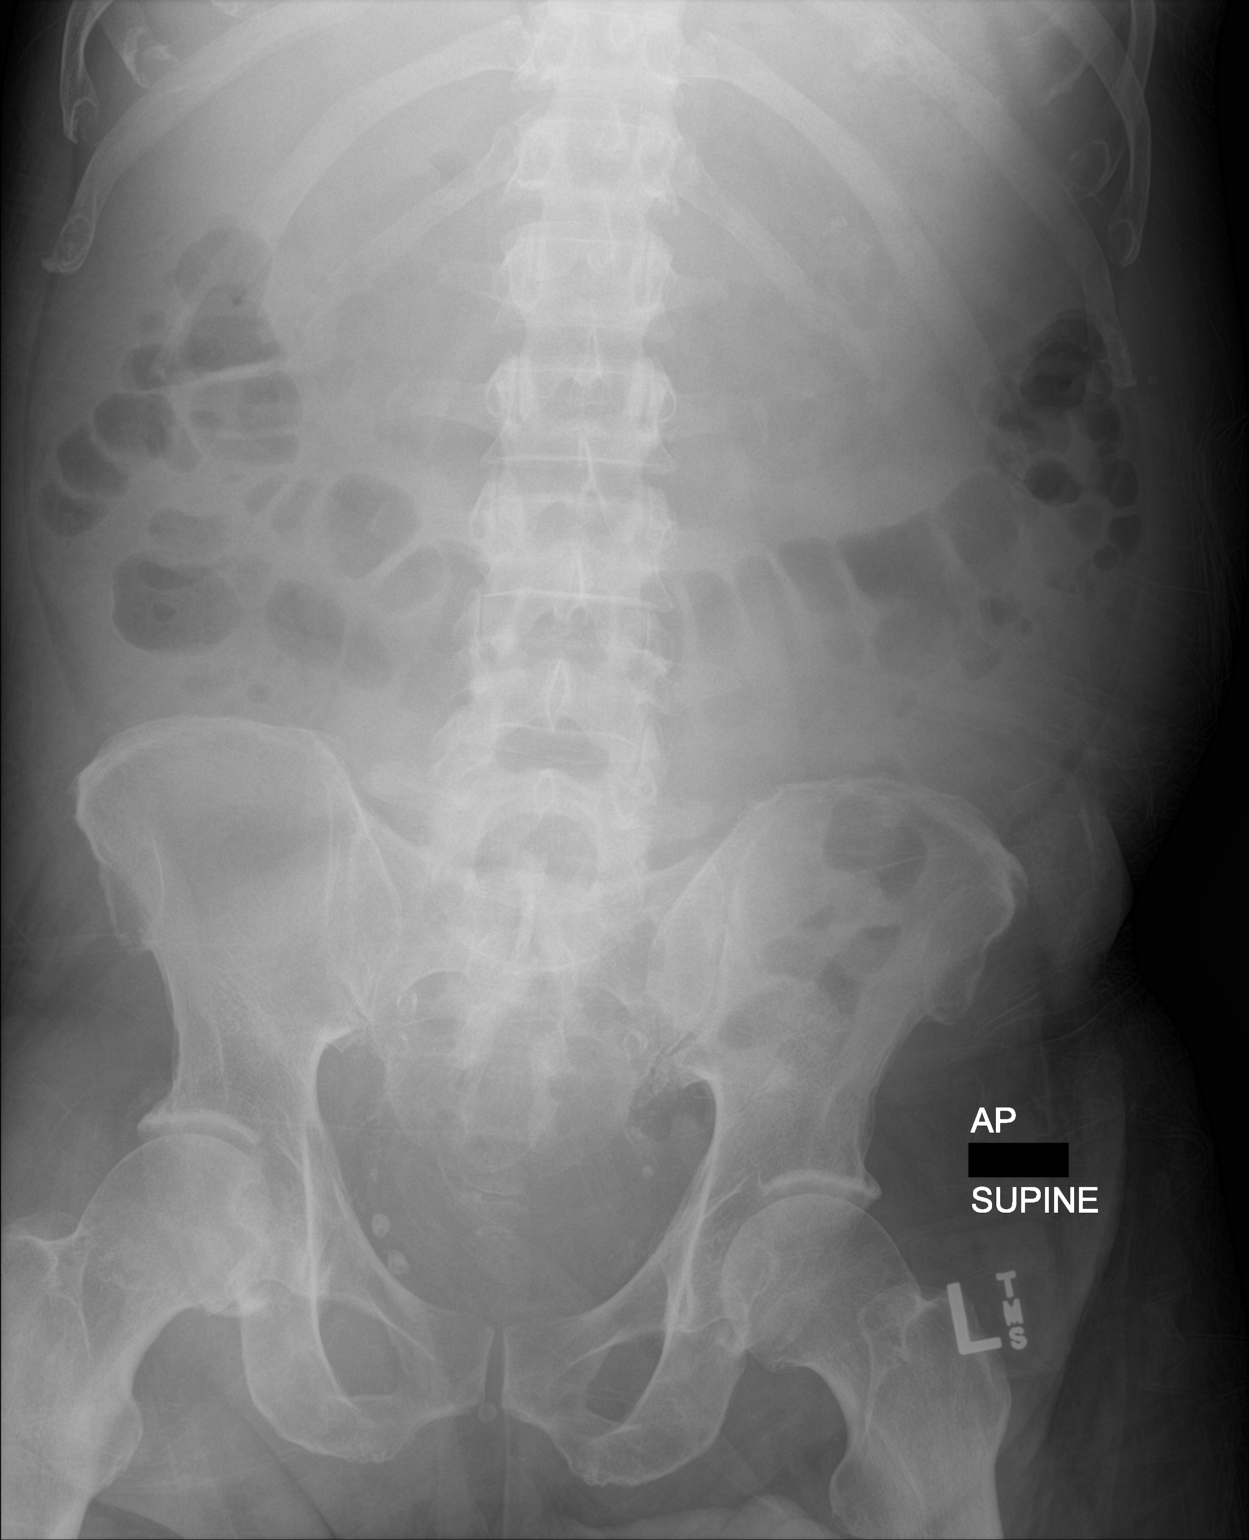

[1 of 1 positions shown; findings below may reference images not displayed]

FINDINGS: The bowel gas pattern is normal. No nephrolithiasis is noted.
Phleboliths are noted in the pelvis.
IMPRESSION: No abnormal bowel dilatation is noted.

## 2021-12-02 NOTE — Progress Notes (Signed)
PROGRESS NOTE  Manuel Kramer  DOB: [birth year]  PCP: Vonna Drafts, FNP [id number]  DOA: 11/14/2021  LOS: 18 days  Hospital Day: 19  Chief Complaint  Patient presents with   Altered Mental Status   Brief narrative: Manuel Kramer is a 66 y.o. male with PMH significant for DM2, HTN, chronic diarrhea who lives at home alone. Patient was brought to the ED on 11/14/2021 by EMS for altered mental status. His neighbors noted his medical alarm going off and hence EMS and police were involved.  EMS found patient agitated and combative with nonverbal status and nonpurposeful movements.  Patient was brought to the ED. In the ED he was noted to have left-sided gaze preference.  His GCS was low and needed emergent intubation.  He was bolused with Keppra, placed on nitro infusion, later on Cleviprex for hypertension. Admitted to ICU. On 1/18, MRI brain showed acute infarcts within the right frontal operculum, right internal capsule, right frontoparietal periventricular white matter and posterior right frontal lobe.  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  Mild to moderate generalized cerebral atrophy and mild cerebellar atrophy. On 1/19, extubated. On 1/21, transfer out of ICU to [facility]    Subjective: Patient seen and examined at bedside.  Complained about significant dizziness earlier this morning, MRI brain negative.  Still having significant diarrhea, reported fatigue.  Noted to be always hungry.   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 Neurology consultation  Stroke work-up completed.   Echo with EF 60 to 65%, severe LVH, no atrial level shunt. Carotid artery duplex without significant stenosis A1c 9.9, LDL 88 Neurology recommended aspirin 81 mg daily and Lipitor 40 mg at bedtime PT/OT/ST eval obtained. SNF recommended  Uncontrolled hypertension Patient probably had chronic uncontrolled hypertension leading to hypertensive microangiopathy, severe LVH Continue Coreg, clonidine patch, amlodipine, lisinopril and hydralazine Continue to monitor  ??Possible acute on chronic diastolic HF Reported dyspnea, with bilateral lower extremity edema On room air BNP elevated at 648 Chest x-ray, no acute cardiopulmonary abnormality Echo showed EF of 60 to 65%, no regional wall motion abnormality, grade 1 diastolic dysfunction Held Lasix due to bump in Cr and monitor bmp closely Strict I's and O's, daily weights  Mild leukocytosis Resolved Currently afebrile No signs of infection Procalcitonin negative Daily CBC  Dysphagia Dysphagia 3 diet per speech.  Diarrhea History of chronic diarrhea Recurrent, ongoing GI pathogen panel negative  Imodium as needed Abdominal x-ray unremarkable CT abdomen pelvis without contrast pending Continue Cipro, Flagyl, plan to DC after 5 days total   Uncontrolled type 2 diabetes mellitus with hyperglycemia A1c 9.9 on 11/14/2021 Home meds include Lantus 30 to 64 units at bedtime, empagliflozin-linagliptin daily Currently on Semglee 40 units daily, empagliflozin/linagliptin and sliding scale insulin with Accu-Cheks.  AKI on CKD 3B Baseline creatinine 1.4 from 2021 Daily BMP  Anemia of chronic kidney disease Anemia panel showed iron 78, saturation 33, ferritin 308, folate 7 Daily CBC    Mobility: PT eval obtained. Living condition: Was at home Goals of care:   Code Status: Full Code  Nutritional status: Body mass index is 22.77 kg/m.  Nutrition Problem: Severe Malnutrition Etiology: chronic illness Signs/Symptoms: severe muscle  depletion, severe  fat depletion Diet:  Diet Order             DIET DYS 3 Room service appropriate? Yes with Assist; Fluid consistency: Thin  Diet effective now                  DVT prophylaxis:  heparin injection 5,000 Units Start: 11/16/21 1400 SCDs Start: 11/14/21 0830   Antimicrobials: Cipro, Flagyl Fluid: None Consultants: Neurology Family Communication: None at bedside  Status is: Inpatient  Continue in-hospital care because: Level of care: Med-Surg   Dispo: The patient is from: Home              Anticipated d/c is to: SNF              Patient currently is not medically stable to d/c.   Difficult to place patient No     Infusions:    Scheduled Meds:  amLODipine  10 mg Oral Daily   aspirin  81 mg Oral Daily   atorvastatin  40 mg Oral Daily   carvedilol  6.25 mg Oral BID WC   ciprofloxacin  500 mg Oral BID   cloNIDine  0.3 mg Transdermal Weekly   empagliflozin  10 mg Oral Daily   And   linagliptin  5 mg Oral Daily   heparin injection (subcutaneous)  5,000 Units Subcutaneous Q8H   hydrALAZINE  100 mg Oral Q8H   insulin aspart  0-9 Units Subcutaneous TID WC   insulin glargine-yfgn  40 Units Subcutaneous Daily   mouth rinse  15 mL Mouth Rinse q12n4p   metroNIDAZOLE  500 mg Oral Q12H   multivitamin with minerals  1 tablet Oral Daily    PRN meds: dextrose, hydrALAZINE, loperamide   Antimicrobials: Anti-infectives (From admission, onward)    Start     Dose/Rate Route Frequency Ordered Stop   11/29/21 1300  ciprofloxacin (CIPRO) tablet 500 mg        500 mg Oral 2 times daily 11/29/21 1151     11/29/21 1300  metroNIDAZOLE (FLAGYL) tablet 500 mg        500 mg Oral Every 12 hours 11/29/21 1151     11/15/21 1000  vancomycin (VANCOREADY) IVPB 750 mg/150 mL  Status:  Discontinued        750 mg 150 mL/hr over 60 Minutes Intravenous Every 24 hours 11/14/21 0556 11/16/21 1045   11/14/21 2200  ceFEPIme (MAXIPIME) 2 g in sodium chloride 0.9 % 100 mL IVPB   Status:  Discontinued        2 g 200 mL/hr over 30 Minutes Intravenous Every 12 hours 11/14/21 0556 11/16/21 1045   11/14/21 1945  acyclovir (ZOVIRAX) 600 mg in dextrose 5 % 100 mL IVPB  Status:  Discontinued        10 mg/kg  60 kg 112 mL/hr over 60 Minutes Intravenous Every 12 hours 11/14/21 1847 11/18/21 0927   11/14/21 0530  ceFEPIme (MAXIPIME) 2 g in sodium chloride 0.9 % 100 mL IVPB        2 g 200 mL/hr over 30 Minutes Intravenous  Once 11/14/21 0523 11/14/21 0633   11/14/21 0530  metroNIDAZOLE (FLAGYL) IVPB 500 mg        500 mg 100 mL/hr over 60 Minutes Intravenous  Once 11/14/21 0523 11/14/21 0713   11/14/21 0530  vancomycin (VANCOCIN) IVPB 1000 mg/200 mL premix        1,000 mg 200 mL/hr over 60 Minutes Intravenous  Once 11/14/21 0523 11/14/21 3474  Objective: Vitals:   12/02/21 1101 12/02/21 1455  BP: (!) 142/78 132/69  Pulse: 83 91  Resp: 18 18  Temp: 98.2 F (36.8 C) 98.3 F (36.8 C)  SpO2: 98%     Intake/Output Summary (Last 24 hours) at 12/02/2021 1840 Last data filed at 12/02/2021 1831 Gross per 24 hour  Intake 954 ml  Output --  Net 954 ml    Filed Weights   11/17/21 0500 11/20/21 0400 12/01/21 0253  Weight: 61 kg 67.1 kg 64 kg   Weight change:  Body mass index is 22.77 kg/m.   Physical Exam: General: NAD  Cardiovascular: S1, S2 present Respiratory: CTAB Abdomen: Soft, nontender, nondistended, bowel sounds present Musculoskeletal: 2+ bilateral pedal edema noted Skin: Normal Psychiatry: Normal mood   Data Review: I have personally reviewed the laboratory data and studies available.  F/u labs ordered Unresulted Labs (From admission, onward)     Start     Ordered   11/30/21 0759  CBC with Differential/Platelet  Daily,   R     Question:  Specimen collection method  Answer:  Lab=Lab collect   11/30/21 0758   11/30/21 0000000  Basic metabolic panel  Daily,   R     Question:  Specimen collection method  Answer:  Lab=Lab collect   11/30/21 [id number]             Signed, Alma Friendly, MD [facility] 12/02/2021

## 2021-12-02 NOTE — TOC Progression Note (Signed)
Transition of Care [facility]) - Progression Note    Patient Details  Name: Manuel Kramer MRN: [id number] Date of Birth: 07/07/1956  Transition of Care [facility]) CM/SW Contact  Baldemar Lenis, [place] Phone Number: 12/02/2021, 11:32 AM  Clinical Narrative:   CSW notified [name] that patient is not medically ready for discharge at this time, medical workup ongoing. Patient's authorization expires today, so patient will need a new authorization when ready. MD aware. CSW to follow.    Expected Discharge Plan: Skilled Nursing Facility Barriers to Discharge: Continued Medical Work up, English as a second language teacher  Expected Discharge Plan and Services Expected Discharge Plan: Skilled Nursing Facility In-house Referral: Clinical Social Work Discharge Planning Services: CM Consult Post Acute Care Choice: Skilled Nursing Facility Living arrangements for the past 2 months: Single Family Home                                       Social Determinants of Health (SDOH) Interventions    Readmission Risk Interventions No flowsheet data found.

## 2021-12-02 NOTE — Plan of Care (Signed)
Pt is alert oriented x 3-4. Pt ambulatory to bathroom with +1 assist and walker. Pt has had 5 episodes of diarrhea tonight. Pt has been given 2 imodium tonight. Pt verbalized that he is very thirsty and hungry. Pt also complained of dizziness when he gets up to walk to bathroom. On call provider informed and currently in room with patient.   Problem: Clinical Measurements: Goal: Ability to maintain clinical measurements within normal limits will improve Outcome: Progressing Goal: Will remain free from infection Outcome: Progressing Goal: Diagnostic test results will improve Outcome: Progressing Goal: Respiratory complications will improve Outcome: Progressing Goal: Cardiovascular complication will be avoided Outcome: Progressing   Problem: Nutrition: Goal: Adequate nutrition will be maintained Outcome: Progressing   Problem: Elimination: Goal: Will not experience complications related to bowel motility Outcome: Progressing Goal: Will not experience complications related to urinary retention Outcome: Progressing   Problem: Safety: Goal: Ability to remain free from injury will improve Outcome: Progressing   Problem: Skin Integrity: Goal: Risk for impaired skin integrity will decrease Outcome: Progressing   Problem: Education: Goal: Knowledge of disease or condition will improve Outcome: Progressing Goal: Knowledge of secondary prevention will improve (SELECT ALL) Outcome: Progressing Goal: Knowledge of patient specific risk factors will improve (INDIVIDUALIZE FOR PATIENT) Outcome: Progressing Goal: Individualized Educational Video(s) Outcome: Progressing   Problem: Coping: Goal: Will verbalize positive feelings about self Outcome: Progressing Goal: Will identify appropriate support needs Outcome: Progressing   Problem: Health Behavior/Discharge Planning: Goal: Ability to manage health-related needs will improve Outcome: Progressing   Problem: Self-Care: Goal:  Ability to participate in self-care as condition permits will improve Outcome: Progressing Goal: Verbalization of feelings and concerns over difficulty with self-care will improve Outcome: Progressing Goal: Ability to communicate needs accurately will improve Outcome: Progressing   Problem: Nutrition: Goal: Risk of aspiration will decrease Outcome: Progressing Goal: Dietary intake will improve Outcome: Progressing   Problem: Ischemic Stroke/TIA Tissue Perfusion: Goal: Complications of ischemic stroke/TIA will be minimized Outcome: Progressing

## 2021-12-02 NOTE — Significant Event (Signed)
Patient's nurse notified me that patient has been complaining of persistent dizziness on standing since last night.  On checking orthostatics patient was not orthostatic.  On exam at bedside appears nonfocal.  On trying to stand patient is dizzy.  We will get MRI brain.  Further plans based on MRI brain.  Midge Minium

## 2021-12-03 ENCOUNTER — Inpatient Hospital Stay (HOSPITAL_COMMUNITY): Payer: Medicaid Other

## 2021-12-03 LAB — CBC WITH DIFFERENTIAL/PLATELET
Abs Immature Granulocytes: 0.09 10*3/uL — ABNORMAL HIGH (ref 0.00–0.07)
Basophils Absolute: 0 10*3/uL (ref 0.0–0.1)
Basophils Relative: 0 %
Eosinophils Absolute: 0.3 10*3/uL (ref 0.0–0.5)
Eosinophils Relative: 3 %
HCT: 25.4 % — ABNORMAL LOW (ref 39.0–52.0)
Hemoglobin: 8.2 g/dL — ABNORMAL LOW (ref 13.0–17.0)
Immature Granulocytes: 1 %
Lymphocytes Relative: 20 %
Lymphs Abs: 2 10*3/uL (ref 0.7–4.0)
MCH: 33.5 pg (ref 26.0–34.0)
MCHC: 32.3 g/dL (ref 30.0–36.0)
MCV: 103.7 fL — ABNORMAL HIGH (ref 80.0–100.0)
Monocytes Absolute: 1.2 10*3/uL — ABNORMAL HIGH (ref 0.1–1.0)
Monocytes Relative: 12 %
Neutro Abs: 6.1 10*3/uL (ref 1.7–7.7)
Neutrophils Relative %: 64 %
Platelets: 283 10*3/uL (ref 150–400)
RBC: 2.45 MIL/uL — ABNORMAL LOW (ref 4.22–5.81)
RDW: 16.4 % — ABNORMAL HIGH (ref 11.5–15.5)
WBC: 9.7 10*3/uL (ref 4.0–10.5)
nRBC: 0 % (ref 0.0–0.2)

## 2021-12-03 LAB — GLUCOSE, CAPILLARY
Glucose-Capillary: 140 mg/dL — ABNORMAL HIGH (ref 70–99)
Glucose-Capillary: 226 mg/dL — ABNORMAL HIGH (ref 70–99)
Glucose-Capillary: 150 mg/dL — ABNORMAL HIGH (ref 70–99)
Glucose-Capillary: 186 mg/dL — ABNORMAL HIGH (ref 70–99)

## 2021-12-03 LAB — BASIC METABOLIC PANEL
Anion gap: 9 (ref 5–15)
BUN: 21 mg/dL (ref 8–23)
CO2: 25 mmol/L (ref 22–32)
Calcium: 8.1 mg/dL — ABNORMAL LOW (ref 8.9–10.3)
Chloride: 106 mmol/L (ref 98–111)
Creatinine, Ser: 1.83 mg/dL — ABNORMAL HIGH (ref 0.61–1.24)
GFR, Estimated: 41 mL/min — ABNORMAL LOW (ref >60)
Glucose, Bld: 185 mg/dL — ABNORMAL HIGH (ref 70–99)
Potassium: 3.6 mmol/L (ref 3.5–5.1)
Sodium: 140 mmol/L (ref 135–145)

## 2021-12-03 IMAGING — CT CT ABD-PELV W/O CM
2 of 4 series · 15 of 46 positions shown, 17 images · non-contrast
Comparison: CT done on [DATE], abdomen radiograph done on
[DATE]

CLINICAL DATA: Diarrhea



[Series 3: ap without · axial · non-contrast · 0.72mm/px · z∈[+568,+978]mm · 12 of 92 slices shown, 14 images]
[im 5/92  soft-tissue]
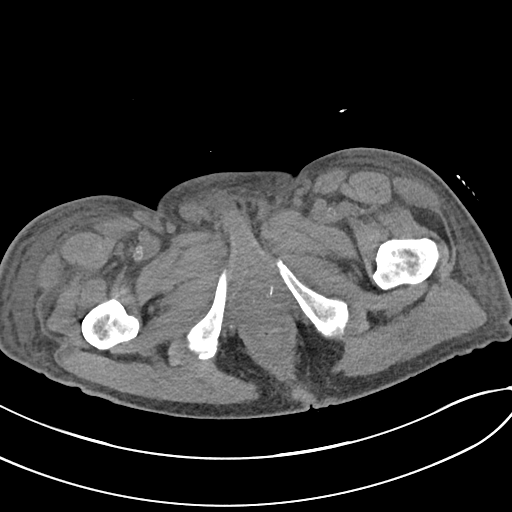
[im 5/92  bone]
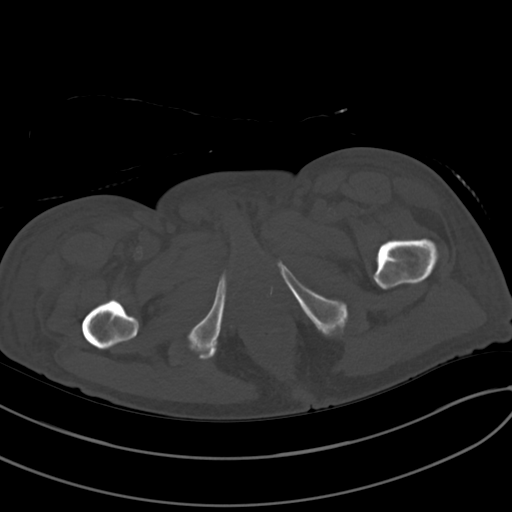
[im 15/92  soft-tissue]
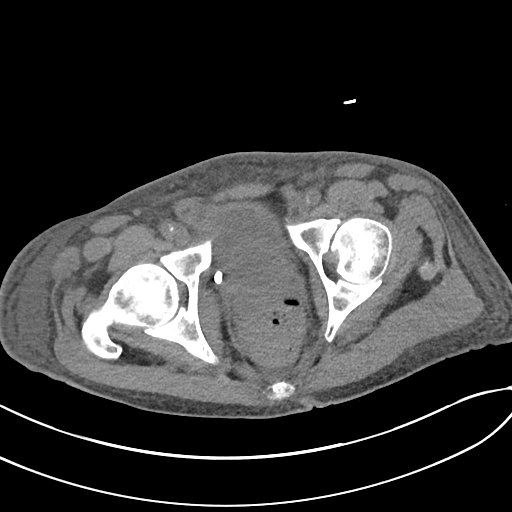
[im 20/92  soft-tissue]
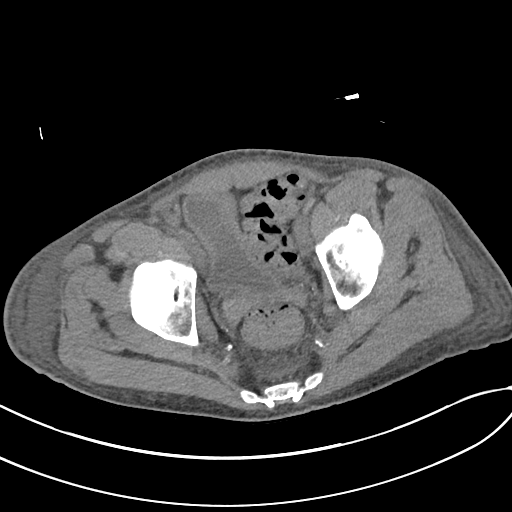
[im 29/92  soft-tissue]
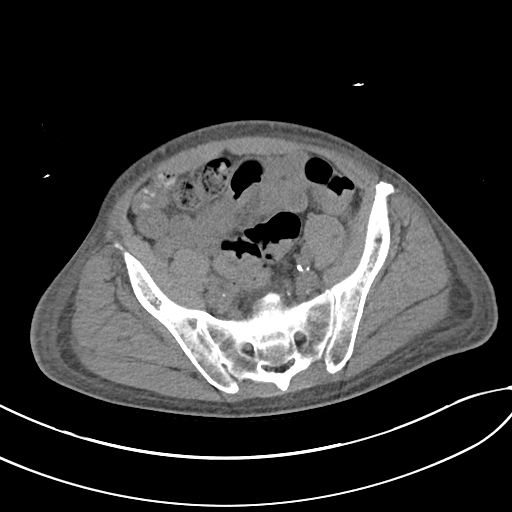
[im 34/92  soft-tissue]
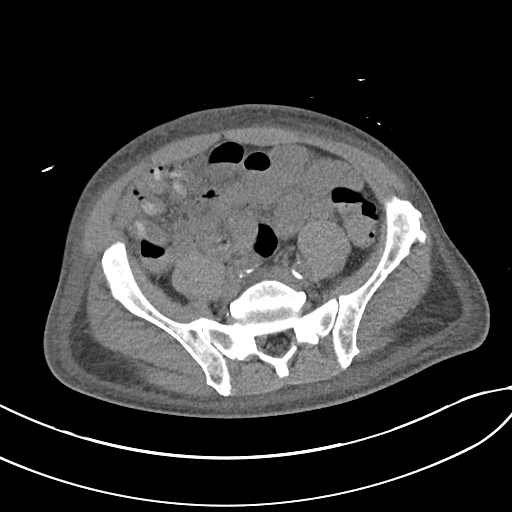
[im 44/92  soft-tissue]
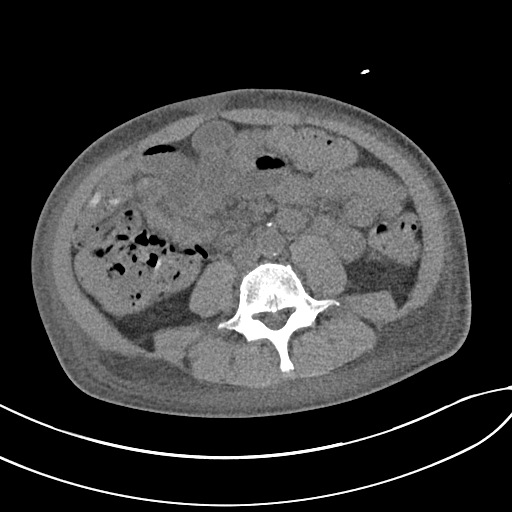
[im 48/92  soft-tissue]
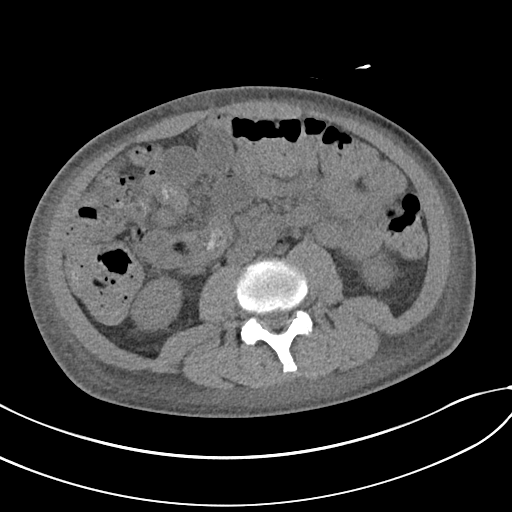
[im 58/92  soft-tissue]
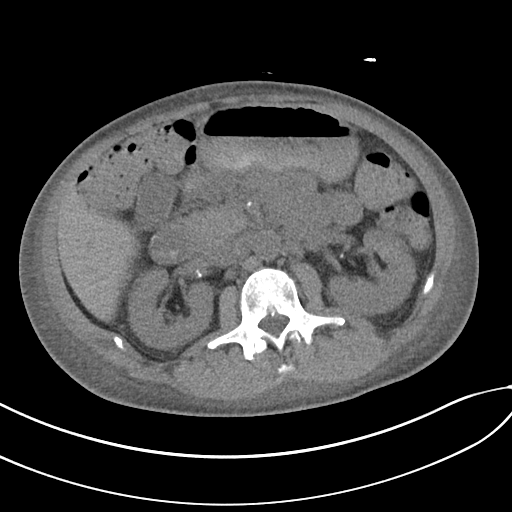
[im 63/92  soft-tissue]
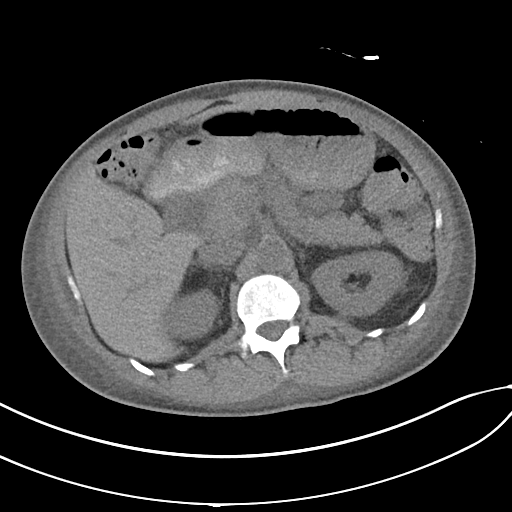
[im 63/92  bone]
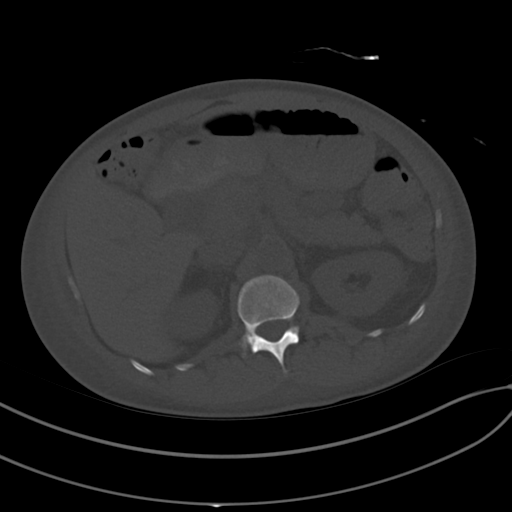
[im 72/92  soft-tissue]
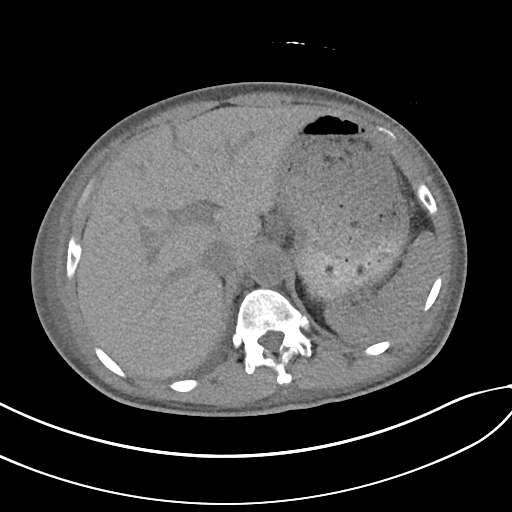
[im 77/92  soft-tissue]
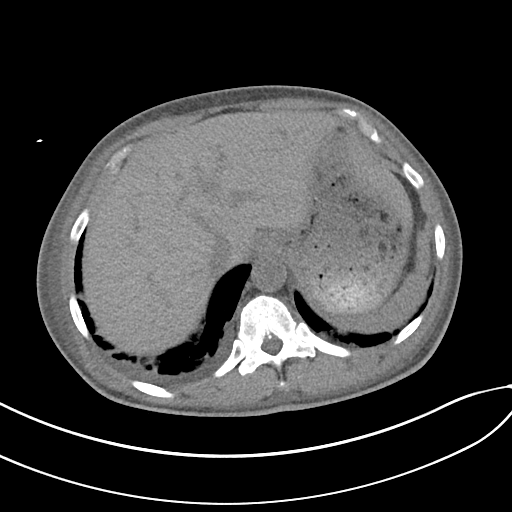
[im 87/92  soft-tissue]
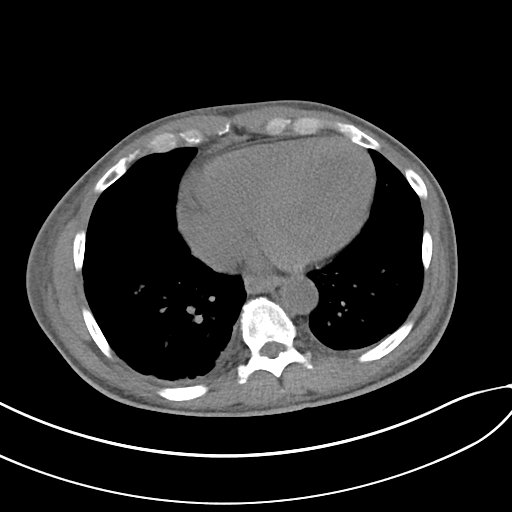

[Series 6: cor · coronal · 0.64mm/px · 3 of 82 slices shown]
[im 28/82  soft-tissue]
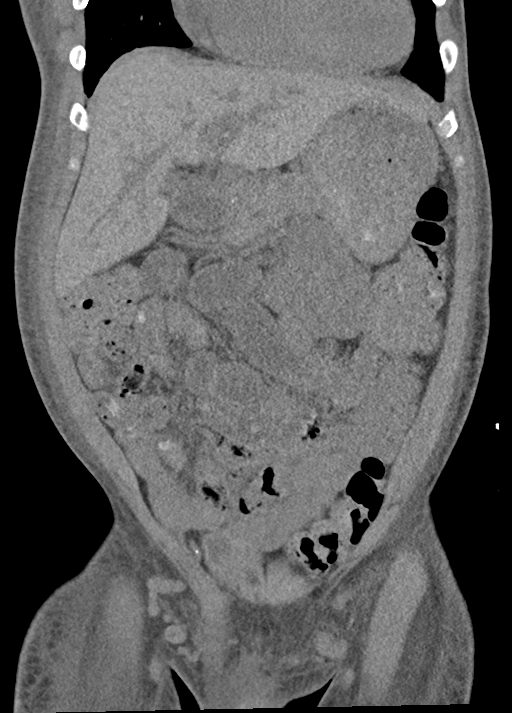
[im 37/82  soft-tissue]
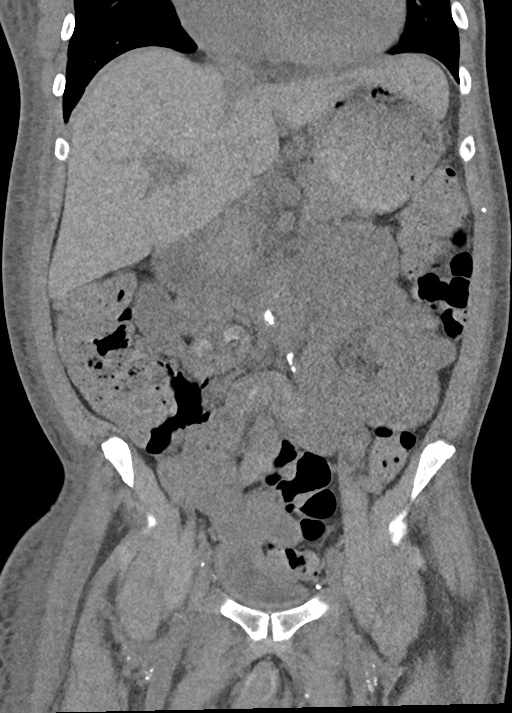
[im 46/82  soft-tissue]
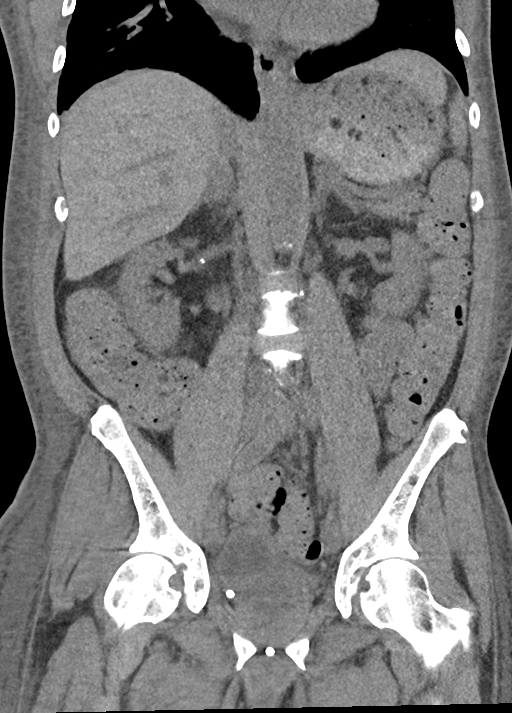

[15 of 46 positions shown; findings below may reference images not displayed]

FINDINGS: Lower chest: There is marked enlargement of heart. Small bilateral
pleural effusions are seen. Small linear densities in the posterior
lower lung fields may suggest subsegmental atelectasis.

Hepatobiliary: No focal abnormality is seen in the liver.
Gallbladder is not distended. Pocket of air seen close to porta
hepatis most likely is within duodenum.

Pancreas: No focal abnormality is seen.

Spleen: There are few coarse calcifications in the spleen. Spleen is
not enlarged.

Adrenals/Urinary Tract: Adrenals are unremarkable. There is no
hydronephrosis. There are scattered calcifications in the renal
artery branches. There are no renal or ureteral stones. Urinary
bladder is not distended. There is 10 mm low-density in the
midportion of right kidney, possibly a cyst.

Stomach/Bowel: Small hiatal hernia is seen. Stomach is moderately
distended. There is mild dilation of small-bowel loops. There is
fluid in the lumen of small bowel loops. Appendix is not dilated.
There is no significant wall thickening in colon. There is no
pericolic stranding or fluid collection.

Vascular/Lymphatic: There are scattered arterial calcifications.

Reproductive: Unremarkable.

Other: There is no ascites or pneumoperitoneum. There is diffuse
edema in subcutaneous plane suggesting anasarca.

Musculoskeletal: Unremarkable.
IMPRESSION: There is no evidence of intestinal obstruction or pneumoperitoneum.
There is no hydronephrosis. Appendix is not dilated. There is mild
dilation of small-bowel loops with fluid in the lumen of distal
small bowel loops. Findings suggest possible nonspecific enteritis.

There is diffuse edema in the subcutaneous plane suggesting possible
anasarca. Small bilateral pleural effusions. Cardiomegaly.

## 2021-12-03 MED ORDER — CARVEDILOL 12.5 MG PO TABS
12.5000 mg | ORAL_TABLET | Freq: Two times a day (BID) | ORAL | Status: DC
  Administered 2021-12-03 (×2): 12.5 mg via ORAL
  Filled 2021-12-03 (×2): qty 1

## 2021-12-03 NOTE — Plan of Care (Signed)
Pt is alert oriented x 4, pt denies pain. Pt refused abdominal CT because he wanted to sleep. Education provided regarding test. Pt stated he would go down today for test.      Problem: Clinical Measurements: Goal: Ability to maintain clinical measurements within normal limits will improve Outcome: Progressing Goal: Will remain free from infection Outcome: Progressing Goal: Diagnostic test results will improve Outcome: Progressing Goal: Respiratory complications will improve Outcome: Progressing Goal: Cardiovascular complication will be avoided Outcome: Progressing   Problem: Nutrition: Goal: Adequate nutrition will be maintained Outcome: Progressing   Problem: Elimination: Goal: Will not experience complications related to bowel motility Outcome: Progressing Goal: Will not experience complications related to urinary retention Outcome: Progressing   Problem: Safety: Goal: Ability to remain free from injury will improve Outcome: Progressing   Problem: Skin Integrity: Goal: Risk for impaired skin integrity will decrease Outcome: Progressing   Problem: Education: Goal: Knowledge of disease or condition will improve Outcome: Progressing Goal: Knowledge of secondary prevention will improve (SELECT ALL) Outcome: Progressing Goal: Knowledge of patient specific risk factors will improve (INDIVIDUALIZE FOR PATIENT) Outcome: Progressing Goal: Individualized Educational Video(s) Outcome: Progressing   Problem: Coping: Goal: Will verbalize positive feelings about self Outcome: Progressing Goal: Will identify appropriate support needs Outcome: Progressing   Problem: Health Behavior/Discharge Planning: Goal: Ability to manage health-related needs will improve Outcome: Progressing   Problem: Self-Care: Goal: Ability to participate in self-care as condition permits will improve Outcome: Progressing   Problem: Self-Care: Goal: Ability to participate in self-care as condition  permits will improve Outcome: Progressing Goal: Verbalization of feelings and concerns over difficulty with self-care will improve Outcome: Progressing Goal: Ability to communicate needs accurately will improve Outcome: Progressing   Problem: Nutrition: Goal: Risk of aspiration will decrease Outcome: Progressing Goal: Dietary intake will improve Outcome: Progressing   Problem: Ischemic Stroke/TIA Tissue Perfusion: Goal: Complications of ischemic stroke/TIA will be minimized Outcome: Progressing

## 2021-12-03 NOTE — Progress Notes (Signed)
PROGRESS NOTE  Manuel Kramer  DOB: Apr 16, 1956  PCP: Vonna Drafts, FNP [id number]  DOA: 11/14/2021  LOS: 19 days  Hospital Day: 65  Chief Complaint  Patient presents with   Altered Mental Status   Brief narrative: Manuel Kramer is a 66 y.o. male with PMH significant for DM2, HTN, chronic diarrhea who lives at home alone. Patient was brought to the ED on 11/14/2021 by EMS for altered mental status. His neighbors noted his medical alarm going off and hence EMS and police were involved.  EMS found patient agitated and combative with nonverbal status and nonpurposeful movements.  Patient was brought to the ED. In the ED he was noted to have left-sided gaze preference.  His GCS was low and needed emergent intubation.  He was bolused with Keppra, placed on nitro infusion, later on Cleviprex for hypertension. Admitted to ICU. On 1/18, MRI brain showed acute infarcts within the right frontal operculum, right internal capsule, right frontoparietal periventricular white matter and posterior right frontal lobe.  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  Mild to moderate generalized cerebral atrophy and mild cerebellar atrophy. On 1/19, extubated. On 1/21, transfer out of ICU to [facility]    Subjective: Patient seen and examined at bedside.  Denies any new complaints.   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 Neurology consultation  Stroke work-up completed.  Echo with EF 60 to 65%, severe LVH, no atrial level shunt. Carotid artery duplex without significant stenosis A1c 9.9, LDL  88 Neurology recommended aspirin 81 mg daily and Lipitor 40 mg at bedtime PT/OT/ST eval obtained. SNF recommended  Uncontrolled hypertension Patient probably had chronic uncontrolled hypertension leading to hypertensive microangiopathy, severe LVH Continue increased Coreg, clonidine patch, amlodipine, hydralazine and held lisinopril for now due to AKI Continue to monitor  ??Possible acute on chronic diastolic HF Reported dyspnea, with bilateral lower extremity edema On room air BNP elevated at 648 Chest x-ray, no acute cardiopulmonary abnormality Echo showed EF of 60 to 65%, no regional wall motion abnormality, grade 1 diastolic dysfunction Held Lasix due to bump in Cr and monitor bmp closely Strict I's and O's, daily weights  Mild leukocytosis Resolved Currently afebrile No signs of infection Procalcitonin negative Daily CBC  Dysphagia Dysphagia 3 diet per speech.  Diarrhea History of chronic diarrhea Nonspecific enteritis GI pathogen panel negative  Imodium as needed Abdominal x-ray unremarkable CT abdomen pelvis without contrast showed nonspecific enteritis Continue Cipro, Flagyl, plan to DC after 7 days total   Uncontrolled type 2 diabetes mellitus with hyperglycemia A1c 9.9 on 11/14/2021 Home meds include Lantus 30 to 64 units at bedtime, empagliflozin-linagliptin daily Currently on Semglee daily, empagliflozin/linagliptin and sliding scale insulin with Accu-Cheks.  AKI on CKD 3B Baseline creatinine 1.4 from 2021 Daily BMP  Anemia of chronic kidney disease Anemia panel showed iron 78, saturation 33, ferritin 308, folate 7 Daily CBC    Mobility: PT eval obtained. Living condition: Was at home Goals of care:   Code Status: Full Code  Nutritional status: Body mass index is 23.88 kg/m.  Nutrition Problem: Severe Malnutrition Etiology: chronic illness Signs/Symptoms: severe muscle depletion, severe fat depletion Diet:  Diet Order  DIET DYS 3  Room service appropriate? Yes with Assist; Fluid consistency: Thin  Diet effective now                  DVT prophylaxis:  heparin injection 5,000 Units Start: 11/16/21 1400 SCDs Start: 11/14/21 0830   Antimicrobials: Cipro, Flagyl Fluid: None Consultants: Neurology Family Communication: None at bedside  Status is: Inpatient  Continue in-hospital care because: Level of care: Med-Surg   Dispo: The patient is from: Home              Anticipated d/c is to: SNF              Patient currently is not medically stable to d/c.   Difficult to place patient No     Infusions:    Scheduled Meds:  amLODipine  10 mg Oral Daily   aspirin  81 mg Oral Daily   atorvastatin  40 mg Oral Daily   carvedilol  12.5 mg Oral BID WC   ciprofloxacin  500 mg Oral BID   cloNIDine  0.3 mg Transdermal Weekly   empagliflozin  10 mg Oral Daily   And   linagliptin  5 mg Oral Daily   heparin injection (subcutaneous)  5,000 Units Subcutaneous Q8H   hydrALAZINE  100 mg Oral Q8H   insulin aspart  0-9 Units Subcutaneous TID WC   insulin glargine-yfgn  40 Units Subcutaneous Daily   mouth rinse  15 mL Mouth Rinse q12n4p   metroNIDAZOLE  500 mg Oral Q12H   multivitamin with minerals  1 tablet Oral Daily    PRN meds: dextrose, hydrALAZINE, loperamide   Antimicrobials: Anti-infectives (From admission, onward)    Start     Dose/Rate Route Frequency Ordered Stop   11/29/21 1300  ciprofloxacin (CIPRO) tablet 500 mg        500 mg Oral 2 times daily 11/29/21 1151 12/06/21 0759   11/29/21 1300  metroNIDAZOLE (FLAGYL) tablet 500 mg        500 mg Oral Every 12 hours 11/29/21 1151 12/06/21 0959   11/15/21 1000  vancomycin (VANCOREADY) IVPB 750 mg/150 mL  Status:  Discontinued        750 mg 150 mL/hr over 60 Minutes Intravenous Every 24 hours 11/14/21 0556 11/16/21 1045   11/14/21 2200  ceFEPIme (MAXIPIME) 2 g in sodium chloride 0.9 % 100 mL IVPB  Status:  Discontinued        2 g 200 mL/hr over 30 Minutes  Intravenous Every 12 hours 11/14/21 0556 11/16/21 1045   11/14/21 1945  acyclovir (ZOVIRAX) 600 mg in dextrose 5 % 100 mL IVPB  Status:  Discontinued        10 mg/kg  60 kg 112 mL/hr over 60 Minutes Intravenous Every 12 hours 11/14/21 1847 11/18/21 0927   11/14/21 0530  ceFEPIme (MAXIPIME) 2 g in sodium chloride 0.9 % 100 mL IVPB        2 g 200 mL/hr over 30 Minutes Intravenous  Once 11/14/21 0523 11/14/21 0633   11/14/21 0530  metroNIDAZOLE (FLAGYL) IVPB 500 mg        500 mg 100 mL/hr over 60 Minutes Intravenous  Once 11/14/21 0523 11/14/21 0713   11/14/21 0530  vancomycin (VANCOCIN) IVPB 1000 mg/200 mL premix        1,000 mg 200 mL/hr over 60 Minutes Intravenous  Once 11/14/21 0523 11/14/21 0811       Objective: Vitals:   12/03/21 1218 12/03/21 1524  BP: (!) 162/83 Marland Kitchen)  154/85  Pulse: 81 81  Resp: 16 16  Temp: 98.4 F (36.9 C) 98 F (36.7 C)  SpO2: 100% 100%    Intake/Output Summary (Last 24 hours) at 12/03/2021 1831 Last data filed at 12/03/2021 0506 Gross per 24 hour  Intake 480 ml  Output 0 ml  Net 480 ml    Filed Weights   11/20/21 0400 12/01/21 0253 12/03/21 0504  Weight: 67.1 kg 64 kg 67.1 kg   Weight change:  Body mass index is 23.88 kg/m.   Physical Exam: General: NAD  Cardiovascular: S1, S2 present Respiratory: CTAB Abdomen: Soft, nontender, nondistended, bowel sounds present Musculoskeletal: 2+ bilateral pedal edema noted Skin: Normal Psychiatry: Normal mood   Data Review: I have personally reviewed the laboratory data and studies available.  F/u labs ordered Unresulted Labs (From admission, onward)     Start     Ordered   11/30/21 0759  CBC with Differential/Platelet  Daily,   R     Question:  Specimen collection method  Answer:  Lab=Lab collect   11/30/21 0758   11/30/21 0000000  Basic metabolic panel  Daily,   R     Question:  Specimen collection method  Answer:  Lab=Lab collect   11/30/21 [id number]            Signed, Alma Friendly,  MD [facility] 12/03/2021

## 2021-12-04 LAB — CBC WITH DIFFERENTIAL/PLATELET
Abs Immature Granulocytes: 0.08 10*3/uL — ABNORMAL HIGH (ref 0.00–0.07)
Basophils Absolute: 0 10*3/uL (ref 0.0–0.1)
Basophils Relative: 0 %
Eosinophils Absolute: 0.3 10*3/uL (ref 0.0–0.5)
Eosinophils Relative: 2 %
HCT: 27.5 % — ABNORMAL LOW (ref 39.0–52.0)
Hemoglobin: 8.5 g/dL — ABNORMAL LOW (ref 13.0–17.0)
Immature Granulocytes: 1 %
Lymphocytes Relative: 12 %
Lymphs Abs: 1.4 10*3/uL (ref 0.7–4.0)
MCH: 32.8 pg (ref 26.0–34.0)
MCHC: 30.9 g/dL (ref 30.0–36.0)
MCV: 106.2 fL — ABNORMAL HIGH (ref 80.0–100.0)
Monocytes Absolute: 1.4 10*3/uL — ABNORMAL HIGH (ref 0.1–1.0)
Monocytes Relative: 12 %
Neutro Abs: 8.6 10*3/uL — ABNORMAL HIGH (ref 1.7–7.7)
Neutrophils Relative %: 73 %
Platelets: 291 10*3/uL (ref 150–400)
RBC: 2.59 MIL/uL — ABNORMAL LOW (ref 4.22–5.81)
RDW: 16.7 % — ABNORMAL HIGH (ref 11.5–15.5)
WBC: 11.8 10*3/uL — ABNORMAL HIGH (ref 4.0–10.5)
nRBC: 0 % (ref 0.0–0.2)

## 2021-12-04 LAB — BASIC METABOLIC PANEL
Anion gap: 8 (ref 5–15)
BUN: 22 mg/dL (ref 8–23)
CO2: 24 mmol/L (ref 22–32)
Calcium: 8 mg/dL — ABNORMAL LOW (ref 8.9–10.3)
Chloride: 108 mmol/L (ref 98–111)
Creatinine, Ser: 1.74 mg/dL — ABNORMAL HIGH (ref 0.61–1.24)
GFR, Estimated: 43 mL/min — ABNORMAL LOW (ref >60)
Glucose, Bld: 224 mg/dL — ABNORMAL HIGH (ref 70–99)
Potassium: 3.9 mmol/L (ref 3.5–5.1)
Sodium: 140 mmol/L (ref 135–145)

## 2021-12-04 LAB — GLUCOSE, CAPILLARY
Glucose-Capillary: 145 mg/dL — ABNORMAL HIGH (ref 70–99)
Glucose-Capillary: 170 mg/dL — ABNORMAL HIGH (ref 70–99)
Glucose-Capillary: 129 mg/dL — ABNORMAL HIGH (ref 70–99)
Glucose-Capillary: 143 mg/dL — ABNORMAL HIGH (ref 70–99)

## 2021-12-04 MED ORDER — CARVEDILOL 12.5 MG PO TABS
25.0000 mg | ORAL_TABLET | Freq: Two times a day (BID) | ORAL | Status: DC
  Administered 2021-12-04 – 2021-12-05 (×4): 25 mg via ORAL
  Filled 2021-12-04 (×4): qty 2

## 2021-12-04 MED ORDER — CARVEDILOL 12.5 MG PO TABS: 25.0000 mg | ORAL_TABLET | Freq: Two times a day (BID) | ORAL | Status: DC

## 2021-12-04 NOTE — Progress Notes (Signed)
PROGRESS NOTE  Manuel Kramer  DOB: 03-27-1956  PCP: Vonna Drafts, FNP [id number]  DOA: 11/14/2021  LOS: 20 days  Hospital Day: 21  Chief Complaint  Patient presents with   Altered Mental Status   Brief narrative: Manuel Kramer is a 66 y.o. male with PMH significant for DM2, HTN, chronic diarrhea who lives at home alone. Patient was brought to the ED on 11/14/2021 by EMS for altered mental status. His neighbors noted his medical alarm going off and hence EMS and police were involved.  EMS found patient agitated and combative with nonverbal status and nonpurposeful movements.  Patient was brought to the ED. In the ED he was noted to have left-sided gaze preference.  His GCS was low and needed emergent intubation.  He was bolused with Keppra, placed on nitro infusion, later on Cleviprex for hypertension. Admitted to ICU. On 1/18, MRI brain showed acute infarcts within the right frontal operculum, right internal capsule, right frontoparietal periventricular white matter and posterior right frontal lobe.  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  Mild to moderate generalized cerebral atrophy and mild cerebellar atrophy. On 1/19, extubated. On 1/21, transfer out of ICU to [facility]    Subjective: Today, patient denies any new complaints.  Noted diarrhea is resolving.   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 Neurology consultation  Stroke work-up completed.  Echo with EF 60 to 65%, severe LVH, no atrial level shunt. Carotid artery duplex without significant stenosis A1c 9.9, LDL  88 Neurology recommended aspirin 81 mg daily and Lipitor 40 mg at bedtime PT/OT/ST eval obtained. SNF recommended  Uncontrolled hypertension Patient probably had chronic uncontrolled hypertension leading to hypertensive microangiopathy, severe LVH Continue increased Coreg, clonidine patch, amlodipine, hydralazine and held lisinopril due to AKI Continue to monitor  ??Possible acute on chronic diastolic HF Reported dyspnea, with bilateral lower extremity edema On room air BNP elevated at 648 Chest x-ray, no acute cardiopulmonary abnormality Echo showed EF of 60 to 65%, no regional wall motion abnormality, grade 1 diastolic dysfunction Held Lasix due to bump in Cr and monitor bmp closely UNNA boots for BLE edema  Strict I's and O's, daily weights  Mild leukocytosis Fluctuating  Currently afebrile No signs of infection Procalcitonin negative Daily CBC  Dysphagia Dysphagia 3 diet per speech.  Diarrhea History of chronic diarrhea Nonspecific enteritis GI pathogen panel negative  Imodium as needed Abdominal x-ray unremarkable CT abdomen pelvis without contrast showed nonspecific enteritis Continue Cipro, Flagyl, plan to DC after 7 days total  May need further outpatient GI work up if chronic diarrhea is persistent  Uncontrolled type 2 diabetes mellitus with hyperglycemia A1c 9.9 on 11/14/2021 Home meds include Lantus 30 to 64 units at bedtime, empagliflozin-linagliptin daily Currently on Semglee daily, empagliflozin/linagliptin and sliding scale insulin with Accu-Cheks.  AKI on CKD 3B Baseline creatinine 1.4 from 2021 Daily BMP  Anemia of chronic kidney disease Anemia panel showed iron 78, saturation 33, ferritin 308, folate 7 Daily CBC    Mobility: PT eval obtained. Living condition: Was at home Goals of care:   Code Status: Full Code  Nutritional status: Body mass index is 23.88 kg/m.  Nutrition Problem: Severe Malnutrition Etiology: chronic  illness  Signs/Symptoms: severe muscle depletion, severe fat depletion Diet:  Diet Order             DIET DYS 3 Room service appropriate? Yes with Assist; Fluid consistency: Thin  Diet effective now                  DVT prophylaxis:  heparin injection 5,000 Units Start: 11/16/21 1400 SCDs Start: 11/14/21 0830   Antimicrobials: Cipro, Flagyl Fluid: None Consultants: Neurology Family Communication: None at bedside  Status is: Inpatient  Continue in-hospital care because: Level of care: Med-Surg   Dispo: The patient is from: Home              Anticipated d/c is to: SNF              Patient currently is not medically stable to d/c.   Difficult to place patient No     Infusions:    Scheduled Meds:  amLODipine  10 mg Oral Daily   aspirin  81 mg Oral Daily   atorvastatin  40 mg Oral Daily   carvedilol  25 mg Oral BID WC   ciprofloxacin  500 mg Oral BID   cloNIDine  0.3 mg Transdermal Weekly   empagliflozin  10 mg Oral Daily   And   linagliptin  5 mg Oral Daily   heparin injection (subcutaneous)  5,000 Units Subcutaneous Q8H   hydrALAZINE  100 mg Oral Q8H   insulin aspart  0-9 Units Subcutaneous TID WC   insulin glargine-yfgn  40 Units Subcutaneous Daily   mouth rinse  15 mL Mouth Rinse q12n4p   metroNIDAZOLE  500 mg Oral Q12H   multivitamin with minerals  1 tablet Oral Daily    PRN meds: dextrose, hydrALAZINE, loperamide   Antimicrobials: Anti-infectives (From admission, onward)    Start     Dose/Rate Route Frequency Ordered Stop   11/29/21 1300  ciprofloxacin (CIPRO) tablet 500 mg        500 mg Oral 2 times daily 11/29/21 1151 12/06/21 0759   11/29/21 1300  metroNIDAZOLE (FLAGYL) tablet 500 mg        500 mg Oral Every 12 hours 11/29/21 1151 12/06/21 0959   11/15/21 1000  vancomycin (VANCOREADY) IVPB 750 mg/150 mL  Status:  Discontinued        750 mg 150 mL/hr over 60 Minutes Intravenous Every 24 hours 11/14/21 0556 11/16/21 1045   11/14/21 2200   ceFEPIme (MAXIPIME) 2 g in sodium chloride 0.9 % 100 mL IVPB  Status:  Discontinued        2 g 200 mL/hr over 30 Minutes Intravenous Every 12 hours 11/14/21 0556 11/16/21 1045   11/14/21 1945  acyclovir (ZOVIRAX) 600 mg in dextrose 5 % 100 mL IVPB  Status:  Discontinued        10 mg/kg  60 kg 112 mL/hr over 60 Minutes Intravenous Every 12 hours 11/14/21 1847 11/18/21 0927   11/14/21 0530  ceFEPIme (MAXIPIME) 2 g in sodium chloride 0.9 % 100 mL IVPB        2 g 200 mL/hr over 30 Minutes Intravenous  Once 11/14/21 0523 11/14/21 0633   11/14/21 0530  metroNIDAZOLE (FLAGYL) IVPB 500 mg        500 mg 100 mL/hr over 60 Minutes Intravenous  Once 11/14/21 0523 11/14/21 0713   11/14/21 0530  vancomycin (VANCOCIN) IVPB 1000 mg/200 mL premix        1,000 mg 200 mL/hr over 60 Minutes Intravenous  Once 11/14/21  [id number] 11/14/21 0811       Objective: Vitals:   12/04/21 1146 12/04/21 1507  BP: (!) 146/75 (!) 147/74  Pulse: 85 84  Resp: 18 18  Temp: 98.7 F (37.1 C) 97.8 F (36.6 C)  SpO2: 99% 100%    Intake/Output Summary (Last 24 hours) at 12/04/2021 1522 Last data filed at 12/03/2021 2141 Gross per 24 hour  Intake 237 ml  Output --  Net 237 ml    Filed Weights   12/01/21 0253 12/03/21 0504 12/04/21 0500  Weight: 64 kg 67.1 kg 67.1 kg   Weight change: 0 kg Body mass index is 23.88 kg/m.   Physical Exam: General: NAD  Cardiovascular: S1, S2 present Respiratory: CTAB Abdomen: Soft, nontender, nondistended, bowel sounds present Musculoskeletal: 2+ bilateral pedal edema noted Skin: Normal Psychiatry: Normal mood   Data Review: I have personally reviewed the laboratory data and studies available.  F/u labs ordered Unresulted Labs (From admission, onward)     Start     Ordered   11/30/21 0759  CBC with Differential/Platelet  Daily,   R     Question:  Specimen collection method  Answer:  Lab=Lab collect   11/30/21 0758            Signed, Alma Friendly, MD [facility] 12/04/2021

## 2021-12-05 ENCOUNTER — Inpatient Hospital Stay (HOSPITAL_COMMUNITY): Payer: Medicaid Other

## 2021-12-05 DIAGNOSIS — E43 Unspecified severe protein-calorie malnutrition: Secondary | ICD-10-CM

## 2021-12-05 LAB — RESP PANEL BY RT-PCR (FLU A&B, COVID) ARPGX2
Influenza A by PCR: NEGATIVE
Influenza B by PCR: NEGATIVE
SARS Coronavirus 2 by RT PCR: NEGATIVE

## 2021-12-05 LAB — CBC WITH DIFFERENTIAL/PLATELET
Abs Immature Granulocytes: 0.08 10*3/uL — ABNORMAL HIGH (ref 0.00–0.07)
Basophils Absolute: 0 10*3/uL (ref 0.0–0.1)
Basophils Relative: 0 %
Eosinophils Absolute: 0.3 10*3/uL (ref 0.0–0.5)
Eosinophils Relative: 3 %
HCT: 26.7 % — ABNORMAL LOW (ref 39.0–52.0)
Hemoglobin: 8.5 g/dL — ABNORMAL LOW (ref 13.0–17.0)
Immature Granulocytes: 1 %
Lymphocytes Relative: 15 %
Lymphs Abs: 1.6 10*3/uL (ref 0.7–4.0)
MCH: 33.6 pg (ref 26.0–34.0)
MCHC: 31.8 g/dL (ref 30.0–36.0)
MCV: 105.5 fL — ABNORMAL HIGH (ref 80.0–100.0)
Monocytes Absolute: 1.2 10*3/uL — ABNORMAL HIGH (ref 0.1–1.0)
Monocytes Relative: 12 %
Neutro Abs: 7.1 10*3/uL (ref 1.7–7.7)
Neutrophils Relative %: 69 %
Platelets: 287 10*3/uL (ref 150–400)
RBC: 2.53 MIL/uL — ABNORMAL LOW (ref 4.22–5.81)
RDW: 16.4 % — ABNORMAL HIGH (ref 11.5–15.5)
WBC: 10.3 10*3/uL (ref 4.0–10.5)
nRBC: 0 % (ref 0.0–0.2)

## 2021-12-05 LAB — BASIC METABOLIC PANEL
Anion gap: 7 (ref 5–15)
BUN: 25 mg/dL — ABNORMAL HIGH (ref 8–23)
CO2: 24 mmol/L (ref 22–32)
Calcium: 8.3 mg/dL — ABNORMAL LOW (ref 8.9–10.3)
Chloride: 108 mmol/L (ref 98–111)
Creatinine, Ser: 1.84 mg/dL — ABNORMAL HIGH (ref 0.61–1.24)
GFR, Estimated: 40 mL/min — ABNORMAL LOW (ref >60)
Glucose, Bld: 149 mg/dL — ABNORMAL HIGH (ref 70–99)
Potassium: 4.2 mmol/L (ref 3.5–5.1)
Sodium: 139 mmol/L (ref 135–145)

## 2021-12-05 LAB — GLUCOSE, CAPILLARY
Glucose-Capillary: 127 mg/dL — ABNORMAL HIGH (ref 70–99)
Glucose-Capillary: 307 mg/dL — ABNORMAL HIGH (ref 70–99)
Glucose-Capillary: 181 mg/dL — ABNORMAL HIGH (ref 70–99)

## 2021-12-05 IMAGING — US US ABDOMEN LIMITED
1 series · 11 of 11 positions shown · non-contrast
Comparison: CT abdomen and pelvis [DATE]

CLINICAL DATA: Ascites

EXAM:
LIMITED ABDOMEN ULTRASOUND FOR ASCITES
TECHNIQUE: Limited ultrasound survey for ascites was performed in all four
abdominal quadrants.

[Series 1: us ascites (abdomen limited) · 11 of 11 slices shown]
[im 1/11]
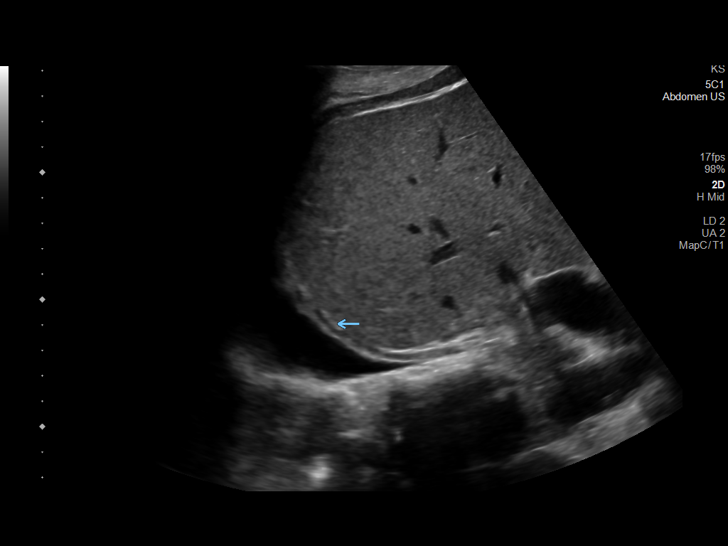
[im 2/11]
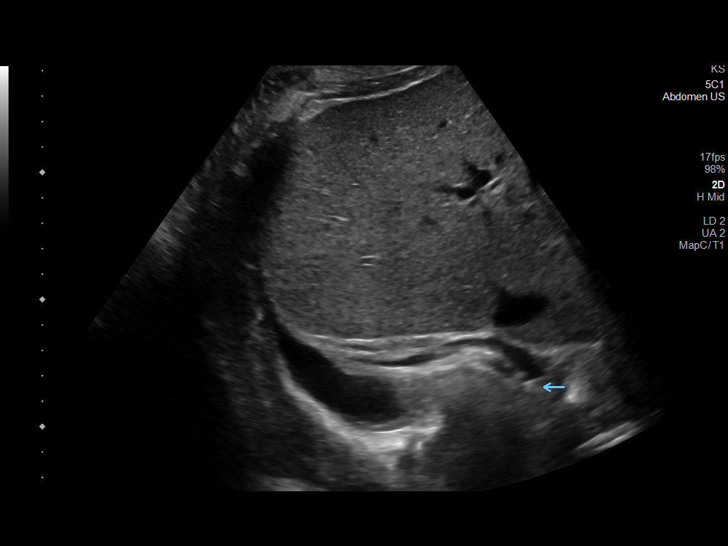
[im 3/11]
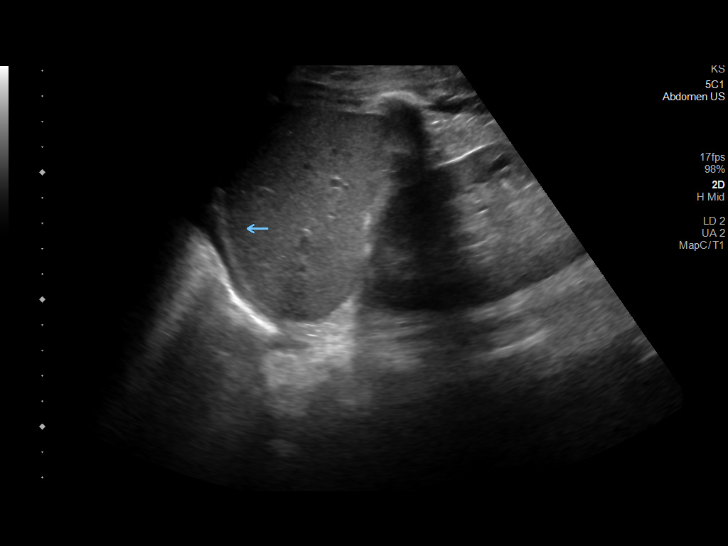
[im 4/11]
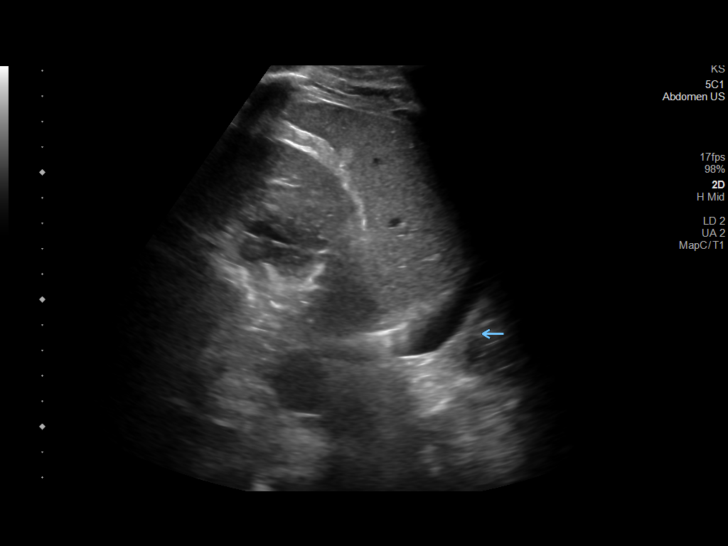
[im 5/11]
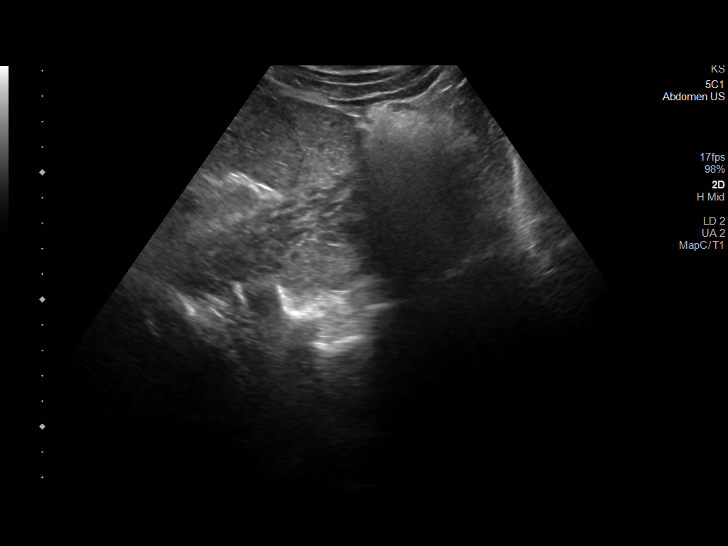
[im 6/11]
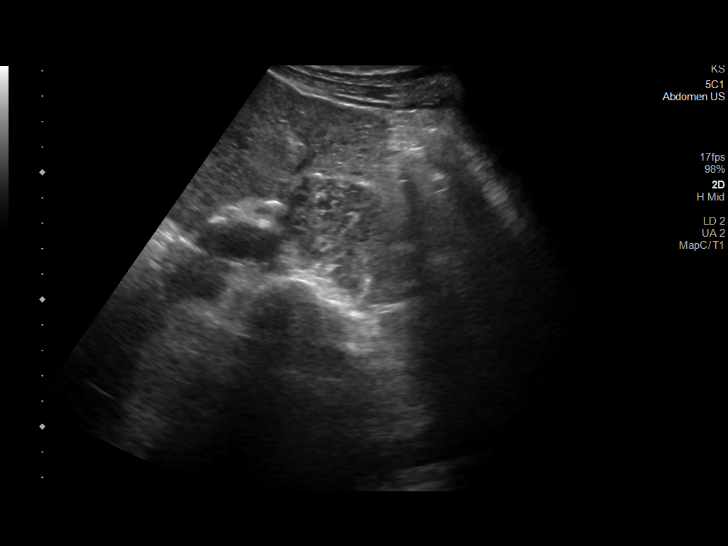
[im 7/11]
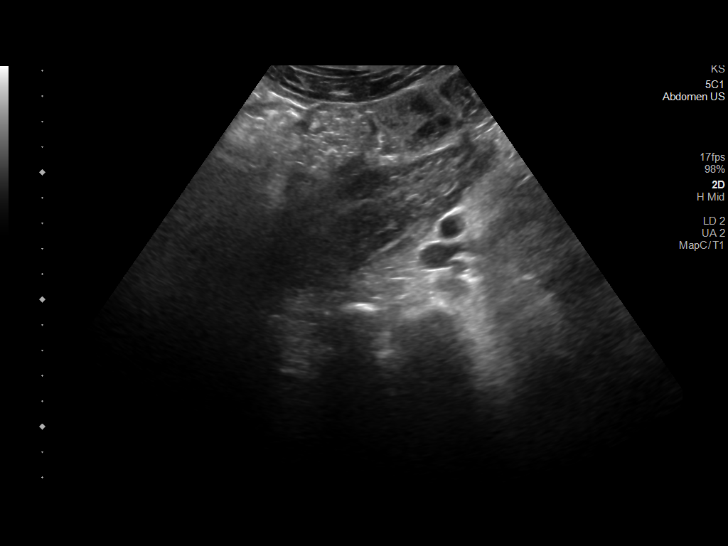
[im 8/11]
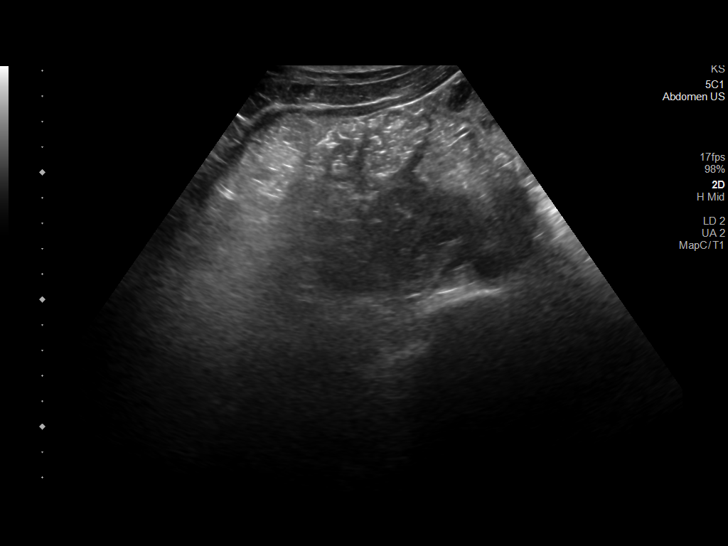
[im 9/11]
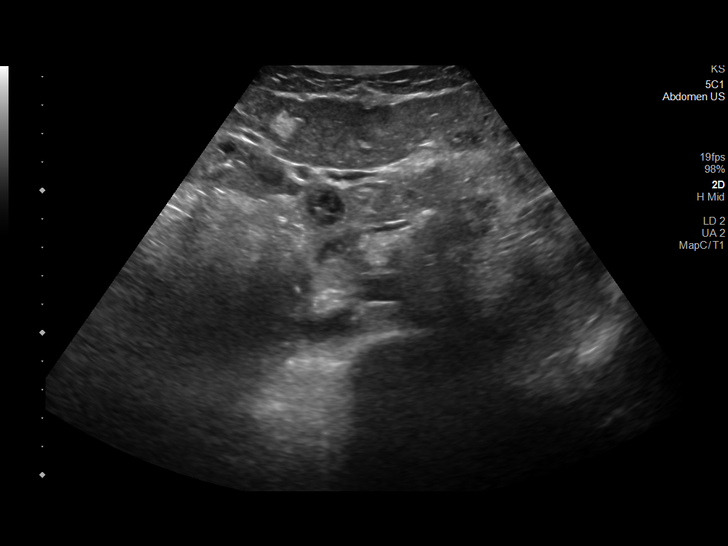
[im 10/11]
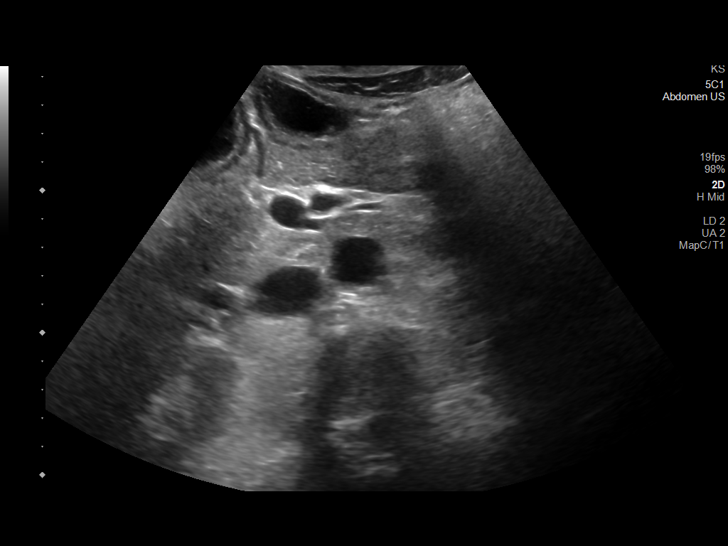
[im 11/11]
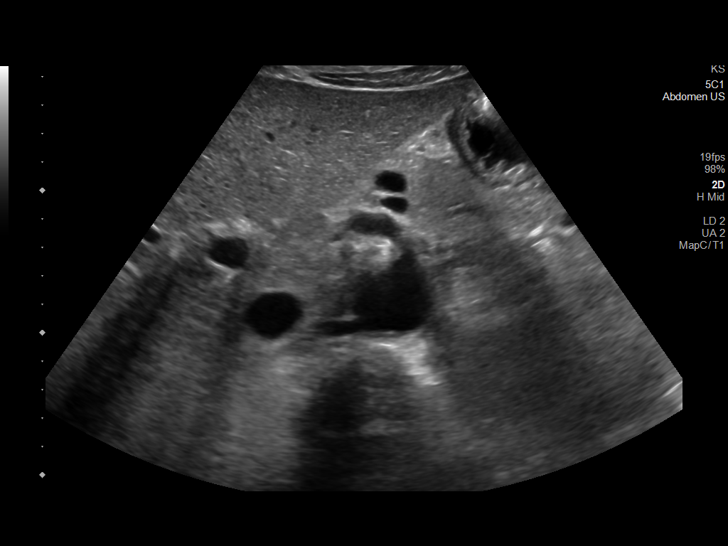

[11 of 11 positions shown; findings below may reference images not displayed]

FINDINGS: No ascites identified.

Small BILATERAL pleural effusions.
IMPRESSION: No ascites.

Small BILATERAL pleural effusions.

## 2021-12-05 IMAGING — DX DG CHEST 1V PORT
1 series · 1 of 1 positions shown · non-contrast
Comparison: Chest x-ray dated [DATE]

CLINICAL DATA: Shortness of breath

EXAM:
PORTABLE CHEST 1 VIEW

[chest ap]
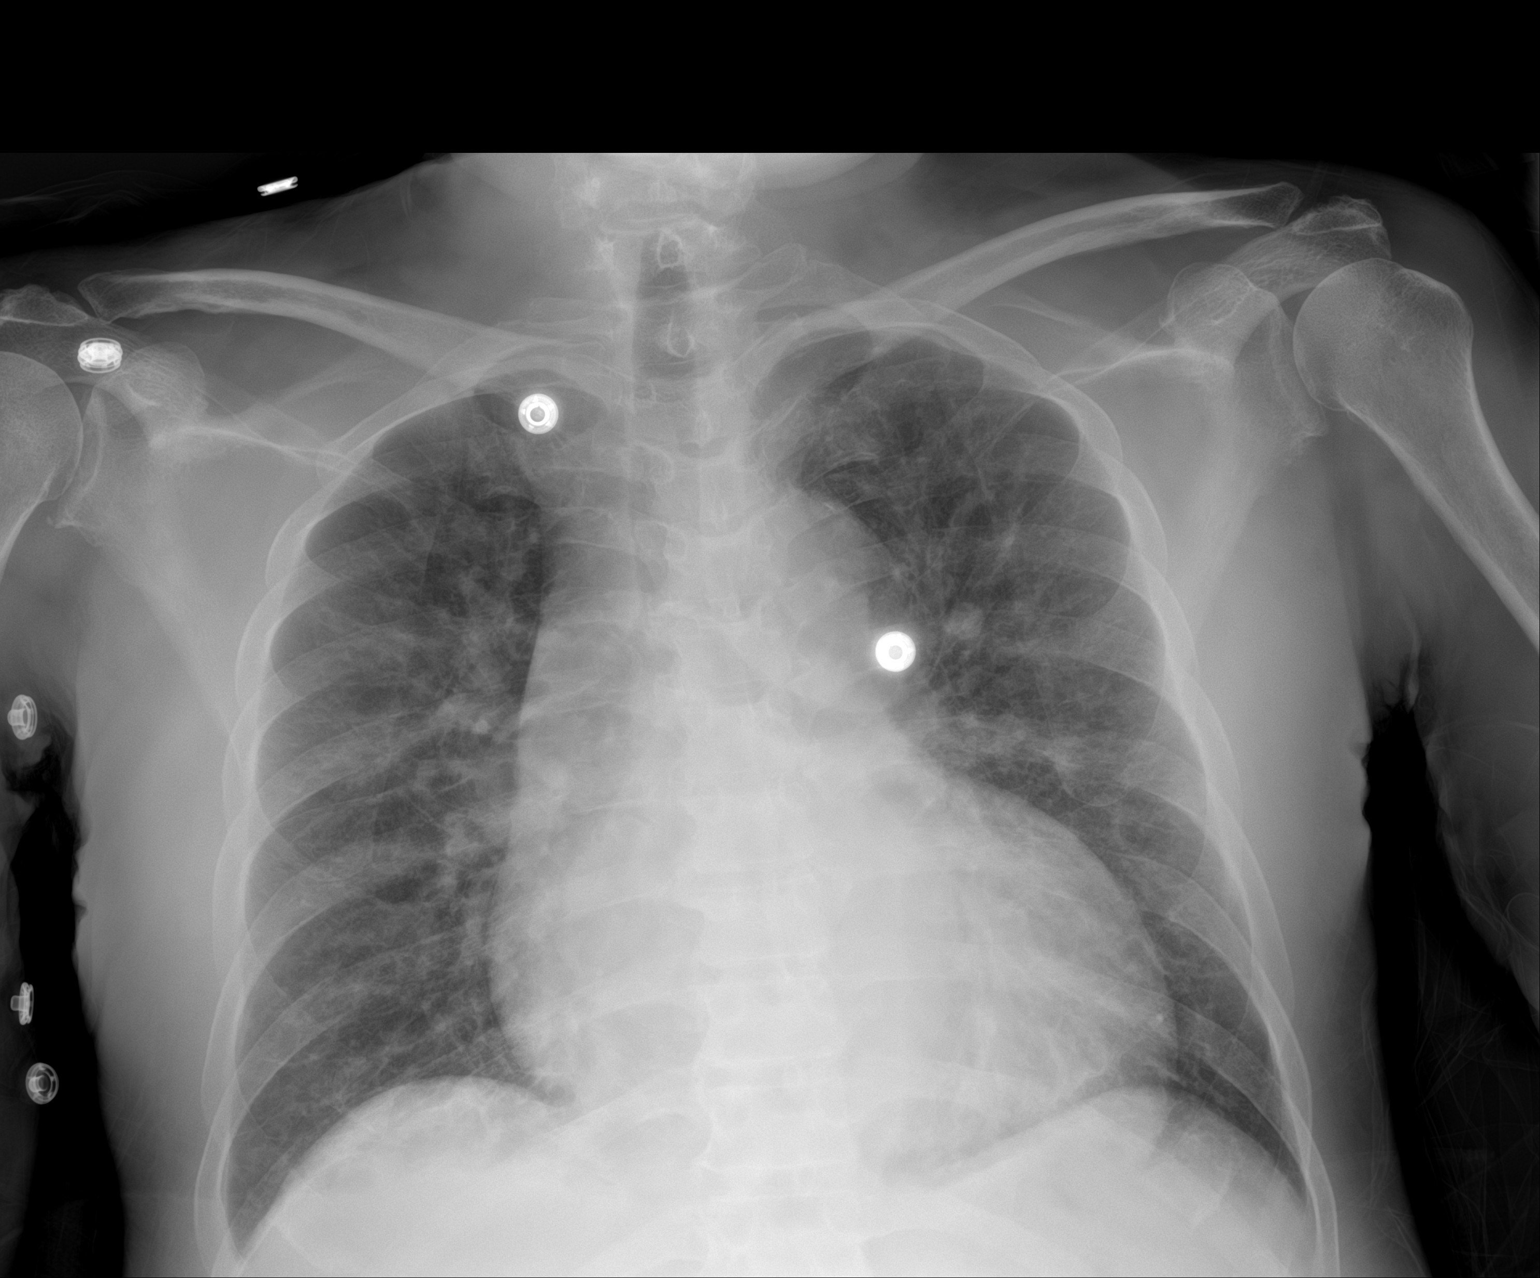

[1 of 1 positions shown; findings below may reference images not displayed]

FINDINGS: Unchanged enlarged cardiac and mediastinal contours. Similar mild
bilateral heterogeneous opacities. No large pleural effusion or
pneumothorax.
IMPRESSION: Similar mild bilateral heterogeneous pulmonary opacities, likely due
to pulmonary edema.

## 2021-12-05 MED ORDER — ATORVASTATIN CALCIUM 40 MG PO TABS: 40.0000 mg | ORAL_TABLET | Freq: Every day | ORAL | Status: AC

## 2021-12-05 MED ORDER — LOPERAMIDE HCL 2 MG PO CAPS: 2.0000 mg | ORAL_CAPSULE | ORAL | 0 refills | Status: DC | PRN

## 2021-12-05 MED ORDER — FUROSEMIDE 10 MG/ML IJ SOLN
20.0000 mg | Freq: Once | INTRAMUSCULAR | Status: AC
  Administered 2021-12-05: 20 mg via INTRAVENOUS
  Filled 2021-12-05: qty 4

## 2021-12-05 MED ORDER — HYDRALAZINE HCL 20 MG/ML IJ SOLN: 10.0000 mg | Freq: Once | INTRAMUSCULAR | Status: DC

## 2021-12-05 MED ORDER — CIPROFLOXACIN HCL 500 MG PO TABS: 500.0000 mg | ORAL_TABLET | Freq: Two times a day (BID) | ORAL | 0 refills | Status: AC

## 2021-12-05 MED ORDER — INSULIN GLARGINE 100 UNIT/ML SOLOSTAR PEN: 45.0000 [IU] | PEN_INJECTOR | Freq: Every day | SUBCUTANEOUS | 11 refills | Status: DC

## 2021-12-05 MED ORDER — CARVEDILOL 25 MG PO TABS: 25.0000 mg | ORAL_TABLET | Freq: Two times a day (BID) | ORAL | Status: AC

## 2021-12-05 MED ORDER — FUROSEMIDE 40 MG PO TABS: 40.0000 mg | ORAL_TABLET | Freq: Every day | ORAL | 0 refills | Status: DC

## 2021-12-05 MED ORDER — METRONIDAZOLE 500 MG PO TABS: 500.0000 mg | ORAL_TABLET | Freq: Two times a day (BID) | ORAL | 0 refills | Status: AC

## 2021-12-05 MED ORDER — ASPIRIN 81 MG PO CHEW: 81.0000 mg | CHEWABLE_TABLET | Freq: Every day | ORAL | 0 refills | Status: AC

## 2021-12-05 MED ORDER — ADULT MULTIVITAMIN W/MINERALS CH: 1.0000 | ORAL_TABLET | Freq: Every day | ORAL | Status: AC

## 2021-12-05 MED ORDER — HYDRALAZINE HCL 100 MG PO TABS: 100.0000 mg | ORAL_TABLET | Freq: Three times a day (TID) | ORAL | Status: DC

## 2021-12-05 NOTE — TOC Transition Note (Signed)
Transition of Care [facility]) - CM/SW Discharge Note   Patient Details  Name: MARKESE BLOXHAM MRN: [id number] Date of Birth: 1955-12-29  [facility]) CM/SW Contact:  Baldemar Lenis, LCSW Phone Number: 12/05/2021, 3:49 PM   Clinical Narrative:   Nurse to call report to [phone], Room 217.    Final next level of care: Skilled Nursing Facility Barriers to Discharge: Barriers Resolved   Patient Goals and CMS Choice   CMS Medicare.gov Compare Post Acute Care list provided to:: Patient Represenative (must comment) Choice offered to / list presented to : Adult Children  Discharge Placement              Patient chooses bed at: [facility] Patient to be transferred to facility by: [facility] Name of family member notified: Self, [name] Patient and family notified of of transfer: 12/05/21  Discharge Plan and Services In-house Referral: Clinical Social Work Discharge Planning Services: [employer] Consult Post Acute Care Choice: Skilled Nursing Facility                               Social Determinants of Health (SDOH) Interventions     Readmission Risk Interventions No flowsheet data found.

## 2021-12-05 NOTE — Discharge Summary (Signed)
Physician Discharge Summary   Patient: Manuel Kramer MRN: [id number] DOB: [birth year]  Admit date:     11/14/2021  Discharge date: 12/05/21  Discharge Physician: Alma Friendly   PCP: Manuel Drafts, FNP   Recommendations at discharge:   Follow-up with PCP in 1 week after discharge from SNF Follow-up with neurology as scheduled   Discharge Diagnoses: Principal Problem:   Acute encephalopathy Active Problems:   Protein-calorie malnutrition, severe   Cerebral embolism with cerebral infarction     Hospital Course: [name] is a 66 y.o. male with PMH significant for DM2, HTN, chronic diarrhea who lives at home alone. Patient was brought to the ED on 11/14/2021 by EMS for altered mental status. His neighbors noted his medical alarm going off and hence EMS and police were involved.  EMS found patient agitated and combative with nonverbal status and nonpurposeful movements.  Patient was brought to the ED. In the ED he was noted to have left-sided gaze preference.  His GCS was low and needed emergent intubation.  He was bolused with Keppra, placed on nitro infusion, later on Cleviprex for hypertension. Admitted to ICU. On 1/18, MRI brain showed acute infarcts within the right frontal operculum, right internal capsule, right frontoparietal periventricular white matter and posterior right frontal lobe.  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  Mild to moderate generalized cerebral atrophy and mild cerebellar atrophy. On 1/19, extubated. On 1/21, transfer out of ICU to [facility]     Today, patient denies any new complaints, still noted about 1+BLE.  Denies any worsening shortness of breath, chest pain, abdominal pain, nausea/vomiting, fever/chills.  Diarrhea has stopped.  Stools are more formed as per  patient.     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 Neurology consulted, outpatient follow-up Stroke work-up completed.  Echo with EF 60 to 65%, severe LVH, no atrial level shunt. Carotid artery duplex without significant stenosis A1c 9.9, LDL 88 Neurology recommended aspirin 81 mg daily and Lipitor 40 mg at bedtime PT/OT/ST eval obtained. SNF recommended   Uncontrolled hypertension Patient probably had chronic uncontrolled hypertension leading to hypertensive microangiopathy, severe LVH Continue increased Coreg, clonidine BID, amlodipine, hydralazine Held home lisinopril and HCTZ due to AKI on CKD   ??Possible acute on chronic diastolic HF Noted persistent BLE Currently on room air BNP elevated at 648 Repeat CXR showed mild bilateral heterogeneous opacities likely mild pulmonary edema Echo showed EF of 60 to 65%, no regional wall motion abnormality, grade 1 diastolic dysfunction Held IV Lasix due to bump in Cr, will discharge on p.o. Lasix 40 mg daily Advise for repeat BMP in 3 days, monitor creatinine closely as patient has some mild AKI on CKD Patient may need outpatient cardiologist if edema is persistent  AKI on CKD 3B Baseline creatinine 1.4 from 2021, no recent creatinine to compare Could be renal congestion CT abdomen pelvis with no renal abnormality Monitor BMP closely while on diuretics Patient will need to follow-up with nephrologist as an outpatient  Diarrhea History of chronic diarrhea Nonspecific enteritis GI pathogen panel negative  Imodium as needed Abdominal x-ray unremarkable CT abdomen pelvis without contrast showed nonspecific enteritis Continue Cipro, Flagyl for 1 more dose  on 12/05/2021 PM, dose will be completed on  12/05/2021 for a total of 7 days May need further outpatient GI work up if chronic diarrhea is persistent  Dysphagia Dysphagia 3 diet per speech.   Uncontrolled type 2 diabetes mellitus with hyperglycemia A1c 9.9 on 11/14/2021 Home meds include Lantus 45 units at bedtime, empagliflozin-linagliptin daily Adjust Lantus as needed   Anemia of chronic kidney disease Anemia panel showed iron 78, saturation 33, ferritin 308, folate 7 Monitor CBC          Consultants: Neurology Procedures performed: Mechanical ventilation Disposition: Skilled nursing facility Diet recommendation:  Cardiac and Carb modified diet  DISCHARGE MEDICATION: Allergies as of 12/05/2021   No Known Allergies      Medication List     STOP taking these medications    gabapentin 300 MG capsule Commonly known as: NEURONTIN   hydrochlorothiazide 25 MG tablet Commonly known as: HYDRODIURIL   lisinopril 40 MG tablet Commonly known as: ZESTRIL   meclizine 25 MG tablet Commonly known as: ANTIVERT       TAKE these medications    acetaminophen 500 MG tablet Commonly known as: TYLENOL Take 1 tablet (500 mg total) by mouth every 6 (six) hours as needed. What changed: reasons to take this   amLODipine 10 MG tablet Commonly known as: NORVASC Take 1 tablet (10 mg total) by mouth daily.   aspirin 81 MG chewable tablet Chew 1 tablet (81 mg total) by mouth daily. Start taking on: December 06, 2021   atorvastatin 40 MG tablet Commonly known as: LIPITOR Take 1 tablet (40 mg total) by mouth daily. Start taking on: December 06, 2021 What changed:  medication strength how much to take   carvedilol 25 MG tablet Commonly known as: COREG Take 1 tablet (25 mg total) by mouth 2 (two) times daily with a meal.   ciprofloxacin 500 MG tablet Commonly known as: CIPRO Take 1 tablet (500 mg total) by mouth 2 (two) times daily for 1 dose.   cloNIDine 0.1 MG tablet Commonly known as: CATAPRES Take 1 tablet  (0.1 mg total) by mouth 2 (two) times daily.   furosemide 40 MG tablet Commonly known as: Lasix Take 1 tablet (40 mg total) by mouth daily.   GLYXAMBI PO Take 1 tablet by mouth daily.   hydrALAZINE 100 MG tablet Commonly known as: APRESOLINE Take 1 tablet (100 mg total) by mouth every 8 (eight) hours.   insulin glargine 100 UNIT/ML Solostar Pen Commonly known as: LANTUS Inject 45 Units into the skin at bedtime. What changed: how much to take   loperamide 2 MG capsule Commonly known as: IMODIUM Take 1 capsule (2 mg total) by mouth as needed for diarrhea or loose stools.   metroNIDAZOLE 500 MG tablet Commonly known as: FLAGYL Take 1 tablet (500 mg total) by mouth every 12 (twelve) hours for 1 dose.   multivitamin with minerals Tabs tablet Take 1 tablet by mouth daily. Start taking on: December 06, 2021        Follow-up Information     [facility]. Schedule an appointment as soon as possible for a visit in 1 month(s).   Specialty: Neurology Why: stroke clinic Contact information: [place] [phone]        Manuel Kramer, [place]. Schedule an appointment as soon as possible for a visit in 1 week(s).   Specialty: Nurse Practitioner Contact information: [address] [place] [address] [phone]  Discharge Exam: Filed Weights   12/03/21 0504 12/04/21 0500 12/05/21 0500  Weight: 67.1 kg 67.1 kg 66.1 kg   General: NAD  Cardiovascular: S1, S2 present Respiratory: CTAB Abdomen: Soft, nontender, nondistended, bowel sounds present Musculoskeletal: 1+ bilateral pedal edema noted Skin: Normal Psychiatry: Normal mood   Condition at discharge: stable  The results of significant diagnostics from this hospitalization (including imaging, microbiology, ancillary and laboratory) are listed below for reference.   Imaging Studies: CT ABDOMEN PELVIS WO  CONTRAST  Result Date: 12/03/2021 CLINICAL DATA:  Diarrhea EXAM: CT ABDOMEN AND PELVIS WITHOUT CONTRAST TECHNIQUE: Multidetector CT imaging of the abdomen and pelvis was performed following the standard protocol without IV contrast. RADIATION DOSE REDUCTION: This exam was performed according to the departmental dose-optimization program which includes automated exposure control, adjustment of the mA and/or kV according to patient size and/or use of iterative reconstruction technique. COMPARISON:  CT done on 10/24/2007, abdomen radiograph done on 12/02/2021 FINDINGS: Lower chest: There is marked enlargement of heart. Small bilateral pleural effusions are seen. Small linear densities in the posterior lower lung fields may suggest subsegmental atelectasis. Hepatobiliary: No focal abnormality is seen in the liver. Gallbladder is not distended. Pocket of air seen close to porta hepatis most likely is within duodenum. Pancreas: No focal abnormality is seen. Spleen: There are few coarse calcifications in the spleen. Spleen is not enlarged. Adrenals/Urinary Tract: Adrenals are unremarkable. There is no hydronephrosis. There are scattered calcifications in the renal artery branches. There are no renal or ureteral stones. Urinary bladder is not distended. There is 10 mm low-density in the midportion of right kidney, possibly a cyst. Stomach/Bowel: Small hiatal hernia is seen. Stomach is moderately distended. There is mild dilation of small-bowel loops. There is fluid in the lumen of small bowel loops. Appendix is not dilated. There is no significant wall thickening in colon. There is no pericolic stranding or fluid collection. Vascular/Lymphatic: There are scattered arterial calcifications. Reproductive: Unremarkable. Other: There is no ascites or pneumoperitoneum. There is diffuse edema in subcutaneous plane suggesting anasarca. Musculoskeletal: Unremarkable. IMPRESSION: There is no evidence of intestinal obstruction or  pneumoperitoneum. There is no hydronephrosis. Appendix is not dilated. There is mild dilation of small-bowel loops with fluid in the lumen of distal small bowel loops. Findings suggest possible nonspecific enteritis. There is diffuse edema in the subcutaneous plane suggesting possible anasarca. Small bilateral pleural effusions. Cardiomegaly. Electronically Signed   By: Elmer Picker M.D.   On: 12/03/2021 11:23   CT HEAD WO CONTRAST (5MM)  Result Date: 11/15/2021 CLINICAL DATA:  Mental status change. Unknown cause. Right-sided weakness. Downward gaze. EXAM: CT HEAD WITHOUT CONTRAST TECHNIQUE: Contiguous axial images were obtained from the base of the skull through the vertex without intravenous contrast. RADIATION DOSE REDUCTION: This exam was performed according to the departmental dose-optimization program which includes automated exposure control, adjustment of the mA and/or kV according to patient size and/or use of iterative reconstruction technique. COMPARISON:  CT brain 11/14/2021 and 01/20/2018 FINDINGS: Brain: There is mild cortical atrophy, within normal limits for patient age. The ventricles are normal in configuration. The basilar cisterns are patent. No mass, mass effect, or midline shift. No acute intracranial hemorrhage is seen. No abnormal extra-axial fluid collection. Unchanged small hypodensity possibly a small lacunar infarct at the superior aspect of the left thalamus. Mild-to-moderate periventricular white matter hypodensities, likely chronic ischemic white matter changes. Preservation of the normal cortical gray-white interface without CT evidence of an acute major vascular territorial cortical based infarction. Vascular:  No hyperdense vessel or unexpected calcification. Skull: Normal. Negative for fracture or focal lesion. Sinuses/Orbits: The visualized orbits are unremarkable. The visualized paranasal sinuses and mastoid air cells are clear. Other: None. IMPRESSION: No significant  change from prior.  No acute intracranial process. Mild cortical atrophy and mild-to-moderate chronic ischemic white matter changes. Electronically Signed   By: Yvonne Kendall M.D.   On: 11/15/2021 18:02   CT Head Wo Contrast  Result Date: 11/14/2021 CLINICAL DATA:  Mental status change with unknown cause EXAM: CT HEAD WITHOUT CONTRAST TECHNIQUE: Contiguous axial images were obtained from the base of the skull through the vertex without intravenous contrast. RADIATION DOSE REDUCTION: This exam was performed according to the departmental dose-optimization program which includes automated exposure control, adjustment of the mA and/or kV according to patient size and/or use of iterative reconstruction technique. COMPARISON:  01/20/2018 FINDINGS: Brain: No evidence of acute infarction, hemorrhage, hydrocephalus, extra-axial collection or mass lesion/mass effect. Relative low-density appearance at the left occipital pole is likely artifact from the inner table of the skull. Deep chronic white matter disease with progression from prior. Generalized atrophy since prior which is also progressed. Vascular: No hyperdense vessel or unexpected calcification. Skull: Normal. Negative for fracture or focal lesion. Sinuses/Orbits: No acute finding. IMPRESSION: 1. No acute finding. 2. Atrophy and chronic small vessel ischemia with significant progression from 2019. Electronically Signed   By: Jorje Guild M.D.   On: 11/14/2021 05:13   MR ANGIO HEAD WO CONTRAST  Result Date: 11/17/2021 CLINICAL DATA:  Follow-up examination for acute stroke. EXAM: MRA HEAD WITHOUT CONTRAST TECHNIQUE: Angiographic images of the Circle of Willis were acquired using MRA technique without intravenous contrast. COMPARISON:  Brain MRI from 11/16/2021. FINDINGS: Anterior circulation: Examination moderately degraded by motion artifact. Visualized distal cervical segments of the internal carotid arteries are patent with antegrade flow. Apparent  stenoses/defects at the cervical petrous junction favored to be artifactual. Petrous segments widely patent. Probable atheromatous irregularity within the carotid siphons with no visible hemodynamically significant stenosis or other abnormality. A1 segments patent bilaterally. Grossly normal anterior communicating artery complex. Anterior cerebral arteries grossly patent and well perfused to their distal aspects without appreciable stenosis. No visible M1 stenosis or occlusion. Normal MCA bifurcations. Distal MCA branches perfused and symmetric. Posterior circulation: Visualized distal V4 segments patent without stenosis. Left vertebral artery dominant. Left PICA patent. Right PICA origin not seen. Basilar patent to its distal aspect without appreciable stenosis. Superior cerebellar arteries patent bilaterally. Both PCAs primarily supplied via the basilar and are well perfused or distal aspects. Anatomic variants: None significant. Other: No visible aneurysm or other vascular malformation on this motion degraded exam. IMPRESSION: 1. Motion degraded exam. 2. No large vessel occlusion. 3. Probable atheromatous change involving the carotid siphons without significant stenosis. No hemodynamically significant or correctable stenosis seen on this motion degraded exam. Electronically Signed   By: Jeannine Boga M.D.   On: 11/17/2021 22:11   MR BRAIN WO CONTRAST  Result Date: 12/02/2021 CLINICAL DATA:  66 year old male found unresponsive with hypotension. Several scattered lacunar infarcts in the right MCA territory last month on MRI. EXAM: MRI HEAD WITHOUT CONTRAST TECHNIQUE: Multiplanar, multiecho pulse sequences of the brain and surrounding structures were obtained without intravenous contrast. COMPARISON:  Brain MRI and intracranial MRA 11/16/21, 11/17/21. FINDINGS: Brain: Extensive chronic microhemorrhages concentrated in the bilateral deep gray matter nuclei, moderately affecting the brainstem, and scattered  elsewhere in the bilateral cerebral and cerebellar hemispheres. Extent is less than typical of amyloid angiopathy. Associated  hemosiderin susceptibility on DWI. No restricted diffusion to suggest acute infarction. No midline shift, mass effect, evidence of mass lesion, ventriculomegaly, extra-axial collection or acute intracranial hemorrhage. Cervicomedullary junction and pituitary are within normal limits. Chronic lacunar infarcts in the bilateral corona radiata, deep gray matter nuclei, right pons. No cortical encephalomalacia or new signal abnormality identified. Vascular: Major intracranial vascular flow voids are stable. Skull and upper cervical spine: Negative. Visualized bone marrow signal is within normal limits. Sinuses/Orbits: Stable, negative. Other: Trace mastoid fluid is stable and layering secretions in the nasopharynx have resolved from last month at which time the patient was intubated. Superficial dermal cyst overlying the right mandible appears benign. IMPRESSION: 1. No acute intracranial abnormality. 2. Extensive chronic small vessel disease with numerous chronic micro-hemorrhages concentrated in the deep gray matter nuclei and brainstem. Electronically Signed   By: Genevie Ann M.D.   On: 12/02/2021 07:04   MR BRAIN WO CONTRAST  Result Date: 11/16/2021 CLINICAL DATA:  Provided history: Mental status change, unknown cause. Additional history provided: Patient found unresponsive at home this morning, incoherent, combative, right eye deviation. Hypertensive. EXAM: MRI HEAD WITHOUT CONTRAST TECHNIQUE: Multiplanar, multiecho pulse sequences of the brain and surrounding structures were obtained without intravenous contrast. COMPARISON:  Head CT 11/15/2021.  CT angiogram head/neck 11/14/2021. FINDINGS: Brain: Mild-to-moderate generalized cerebral atrophy. Comparatively mild cerebellar atrophy. 2 mm acute cortical infarct within the right frontal operculum (series 2, image 24). Punctate acute infarct  within the genu of the right internal capsule (series 2, image 23). Punctate acute infarct within the right frontoparietal periventricular white matter (series 2, image 18). 3 mm acute infarct within the posterior right frontal lobe subcortical white matter (series 2, image 14). 6 mm focus of diffusion-weighted signal hyperintensity within the right frontoparietal periventricular white matter (series 2, image 14). There is corresponding T2 shine through at this site on the [facility] map, and this likely reflects a subacute infarct. Apparent restricted diffusion within the bilateral thalami likely reflects artifact from chronic blood products at these sites. Chronic lacunar infarcts within the bilateral cerebral hemispheric white matter. Background moderate multifocal T2 FLAIR hyperintense signal abnormality within the cerebral white matter, nonspecific but compatible with chronic small vessel ischemic disease. Mild chronic small vessel ischemic changes are also present within the pons. Chronic lacunar infarcts within the bilateral thalami, some with associated chronic blood products. Fairly numerous supratentorial and infratentorial chronic parenchymal microhemorrhages with a deep gray nuclei, brainstem and cerebellar predominance. No evidence of an intracranial mass. No extra-axial fluid collection. No midline shift. Vascular: Maintained flow voids within the proximal large arterial vessels. Skull and upper cervical spine: No focal suspicious marrow lesion. Sinuses/Orbits: Visualized orbits show no acute finding. Minimal diffuse paranasal sinus mucosal thickening. Other: Small bilateral mastoid effusions. IMPRESSION: Subcentimeter acute infarcts within the right frontal operculum, genu of right internal capsule, right frontoparietal periventricular white matter, and posterior right frontal lobe subcortical white matter as described. 6 mm subacute infarct within right frontoparietal periventricular white matter.  Background moderate chronic small vessel ischemic changes within the cerebral white matter, including chronic lacunar infarcts. Mild chronic small vessel ischemic changes also present within the pons. 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 Fairly numerous supratentorial and infratentorial chronic parenchymal microhemorrhages. 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 Mild-to-moderate generalized cerebral atrophy. Comparatively mild cerebellar atrophy. Small bilateral mastoid effusions. Electronically Signed   By: Kellie Simmering D.O.   On: 11/16/2021 14:34   US RENAL  Result Date: 11/15/2021 CLINICAL DATA:  Acute  kidney injury. EXAM: RENAL / URINARY TRACT ULTRASOUND COMPLETE COMPARISON:  04/23/2015 FINDINGS: Right Kidney: Renal measurements: 10.6 x 4.7 x 4.9 centimeters = volume: 125.9 mL. Midpole cyst measures 1.1 centimeters. Mildly increased echogenicity. No hydronephrosis. Left Kidney: Renal measurements: 11.3 x 5.9 x 4.7 centimeters = volume: 163.5 mL. Small cysts are present, largest 1.1 centimeters. Mildly increased echogenicity. No hydronephrosis. Bladder: Foley catheter decompresses urinary bladder. Other: Small amount of ascites noted in the 4 quadrants. IMPRESSION: 1. Mildly increased renal echogenicity bilaterally. 2. No hydronephrosis. 3. Small bilateral renal cysts. 4. Small amount of ascites throughout the abdomen and pelvis. Electronically Signed   By: Nolon Nations M.D.   On: 11/15/2021 12:40   DG Chest Port 1 View  Result Date: 12/05/2021 CLINICAL DATA:  Shortness of breath EXAM: PORTABLE CHEST 1 VIEW COMPARISON:  Chest x-ray dated November 30, 2021 FINDINGS: Unchanged enlarged cardiac and mediastinal contours. Similar mild bilateral heterogeneous opacities. No large pleural effusion or pneumothorax. IMPRESSION: Similar mild bilateral heterogeneous pulmonary opacities, likely due to pulmonary edema. Electronically Signed   By: Yetta Glassman  M.D.   On: 12/05/2021 10:51   DG Chest Port 1 View  Result Date: 11/30/2021 CLINICAL DATA:  66 year old male with shortness of breath. Code stroke presentation. EXAM: PORTABLE CHEST 1 VIEW COMPARISON:  Portable chest 11/15/2021 and earlier. FINDINGS: Portable AP upright view at 1052 hours. Extubated. Improved lung volumes and ventilation. Stable cardiomegaly and mediastinal contours. Visualized tracheal air column is within normal limits. Allowing for portable technique the lungs are clear. No acute osseous abnormality identified. Paucity of bowel gas in the upper abdomen. IMPRESSION: No acute cardiopulmonary abnormality. Electronically Signed   By: Genevie Ann M.D.   On: 11/30/2021 10:58   DG Chest Port 1 View  Result Date: 11/15/2021 CLINICAL DATA:  Respiratory failure. EXAM: PORTABLE CHEST 1 VIEW COMPARISON:  11/14/2021 FINDINGS: ET tube tip is above the carina. There is a nasogastric tube with tip and side port below the level of the GE junction. Heart size the cardiomediastinal contours appear stable. No pleural effusion or edema. IMPRESSION: 1. Satisfactory position of support apparatus. 2. No acute cardiopulmonary abnormalities. Electronically Signed   By: Kerby Moors M.D.   On: 11/15/2021 05:38   DG Chest Port 1 View  Result Date: 11/14/2021 CLINICAL DATA:  Intubation.  Questionable sepsis EXAM: PORTABLE CHEST 1 VIEW COMPARISON:  05/25/2020 FINDINGS: Endotracheal tube with tip just below the clavicular heads. The enteric tube reaches the stomach. Extensive artifact from EKG leads. Cardiomegaly and aortic tortuosity. There is no edema, consolidation, effusion, or pneumothorax. IMPRESSION: Unremarkable hardware. No acute finding. Electronically Signed   By: Jorje Guild M.D.   On: 11/14/2021 05:11   DG Abd Portable 1V  Result Date: 12/02/2021 CLINICAL DATA:  Diarrhea. EXAM: PORTABLE ABDOMEN - 1 VIEW COMPARISON:  October 24, 2007. FINDINGS: The bowel gas pattern is normal. No nephrolithiasis is  noted. Phleboliths are noted in the pelvis. IMPRESSION: No abnormal bowel dilatation is noted. Electronically Signed   By: Marijo Conception M.D.   On: 12/02/2021 10:41   EEG adult  Result Date: 11/14/2021 Willaim Rayas, MD     11/14/2021 10:54 AM [facility] Routine EEG Report Patient Name:   Manuel Kramer Date of Birth:   November 08, 1955 Identification Number:   MRN - [id number] Indication: Encephalopathy, Technical Summary: A routine 20 channel electroencephalogram using the international 10-20 system of electrode placement was performed. States      Coma Abnormalities Generalized  Slowing: Diffuse generalized slowing Background Slowing: The background consists of 20-50 uV, low amplitude activity. Difficult to assess accurate background with level of suppression. Activation Procedures Hyperventilation: Not performed Photic Stimulation: Not performed Classification: Abnormal : Diagnosis: 1. Diffuse low amplitude, suppressed background consistent with nonspecific encephalopathy, neuronal dysfunction. No abnormal discharges or seizures are seen in this 22 minute recording [id number] LTM EEG is now running. Dr Apolinar Junes [facility] [phone] Case [id number]  Overnight EEG with video  Result Date: 11/16/2021 Samuella Cota, MD     11/16/2021  8:20 AM EEG Procedure CPT/Type of Study: [id number]; 24hr EEG with video Referring Provider: Hunsucker Primary Neurological Diagnosis: AMS History: This is a 66 yr old patient, undergoing an EEG to evaluate for acute encephalopathy. Clinical State: comatose Technical Description: The EEG was performed using standard setting per the guidelines of American Clinical Neurophysiology Society (ACNS). A minimum of 21 electrodes were placed on scalp according to the International 10-20 or/and 10-10 Systems. Supplemental electrodes were placed as needed. Single EKG electrode was also used to detect cardiac arrhythmia. Patient's  behavior was continuously recorded on video simultaneously with EEG. A minimum of 16 channels were used for data display. Each epoch of study was reviewed manually daily and as needed using standard referential and bipolar montages. Computerized quantitative EEG analysis (such as compressed spectral array analysis, trending, automated spike & seizure detection) were used as indicated. Day 2: from 0730 11/15/21 to 1150 11/15/21, 1816 11/15/21 to 0730 11/16/21 EEG Description: Overall Amplitude: suppressed Predominant Frequency: The background activity showed delta-theta activity with 1-3Hz  background Superimposed Frequencies: none The background was symmetric Background Abnormalities: Background suppression with rare low voltage slow activity Rhythmic or periodic pattern: GRDA, runs of 1-2Hz  GRDA with arousal Epileptiform activity: no Electrographic seizures: no Events: no Breach rhythm: no Reactivity: present with atypical arousal patterns Stimulation procedures: Hyperventilation: not done Photic stimulation: not done Sleep Background: none EKG:no significant arrhythmia Impression: This was an abnormal continuous video EEG due to diffuse slowing with atypical arousal patterns and some runs of GRDA, indicative of a severe encephalopathy pattern. No seizures were seen.   Overnight EEG with video  Result Date: 11/15/2021 Samuella Cota, MD     11/15/2021  7:32 AM EEG Procedure CPT/Type of Study: [id number]; 24hr EEG with video Referring Provider: Hunsucker Primary Neurological Diagnosis: AMS History: This is a 66 yr old patient, undergoing an EEG to evaluate for acute encephalopathy. Clinical State: comatose Technical Description: The EEG was performed using standard setting per the guidelines of American Clinical Neurophysiology Society (ACNS). A minimum of 21 electrodes were placed on scalp according to the International 10-20 or/and 10-10 Systems. Supplemental electrodes were placed as needed. Single EKG electrode was also  used to detect cardiac arrhythmia. Patient's behavior was continuously recorded on video simultaneously with EEG. A minimum of 16 channels were used for data display. Each epoch of study was reviewed manually daily and as needed using standard referential and bipolar montages. Computerized quantitative EEG analysis (such as compressed spectral array analysis, trending, automated spike & seizure detection) were used as indicated. Day 1: from 1005 11/14/21 to 0730 11/15/21 EEG Description: Overall Amplitude: suppressed Predominant Frequency: The background activity showed background suppression with some brief low voltage 1hz  delta slowing. Superimposed Frequencies: none The background was symmetric Background Abnormalities: Background suppression with rare low voltage slow activity Rhythmic or periodic pattern: No Epileptiform activity: no Electrographic seizures: no Events: no Breach rhythm: no Reactivity: Absent Stimulation procedures: Hyperventilation: not done Photic stimulation: not done  Sleep Background: none EKG:no significant arrhythmia Impression: This was a markedly abnormal continuous video EEG due to absent reactivity and near-complete background attenuation, indicative of a severe encephalopathy pattern. No seizures or epileptiform discharges were seen.   Discontinue Long Term Monitor EEG  Result Date: 11/15/2021 Tsosie Billing, MD     11/15/2021  2:55 PM [facility] Long-term EEG Report EEG reviewed period : 11/15/2021 00:14:00 to 11/15/2021 12:14:00 (Eastern Standard Time) Patient Name:   Manuel Kramer, Manuel Kramer Date of Birth:   17-Jun-1956 Identification Number:   MRN - [id number] Study Start Time:   11/15/2021 07:30:00 Study End Time:   11/15/2021 11:50:00 Indication: Encephalopathy, Technical Summary: A routine 20 channel electroencephalogram using the international 10-20 system of electrode placement was performed. Background: 3-4 Hz, Poorly formed  States      Coma Abnormalities Generalized Slowing: Diffuse generalized slowing Background Slowing: The background consists of 20-50 uV, 3-4 Hz diffuse activity with superimposed diffuse polymorphic delta activity that is non reactive to external stimulation. Activation Procedures Hyperventilation: Not performed Photic Stimulation: Not performed Classification: Abnormal : Diagnosis: This is an abnormal EEG due to diffuse delta slowing with intermittent overriding beta activity. This is indicative of generalized brain dysfunction. Dr Tsosie Billing [facility] [phone] Case [id number]  [place] ASCITES (ABDOMEN LIMITED)  Result Date: 12/05/2021 CLINICAL DATA:  Ascites EXAM: LIMITED ABDOMEN ULTRASOUND FOR ASCITES TECHNIQUE: Limited ultrasound survey for ascites was performed in all four abdominal quadrants. COMPARISON:  CT abdomen and pelvis 12/03/2021 FINDINGS: No ascites identified. Small BILATERAL pleural effusions. IMPRESSION: No ascites. Small BILATERAL pleural effusions. Electronically Signed   By: Lavonia Dana M.D.   On: 12/05/2021 09:19   ECHOCARDIOGRAM COMPLETE BUBBLE STUDY  Result Date: 11/18/2021    ECHOCARDIOGRAM REPORT   Patient Name:   Manuel Kramer Date of Exam: 11/18/2021 Medical Rec #:  [id number]        Height:       66.0 in Accession #:    [id number]       Weight:       134.5 lb Date of Birth:  12/26/1955        BSA:          1.689 m Patient Age:    24 years         BP:           168/97 mmHg Patient Gender: M                HR:           72 bpm. Exam Location:  Inpatient Procedure: 2D Echo, Cardiac Doppler, Color Doppler, Saline Contrast Bubble Study            and 3D Echo Indications:    Stroke, bubble  History:        Patient has no prior history of Echocardiogram examinations.                 Risk Factors:Hypertension, Current Smoker and Diabetes.  Sonographer:    Glo Herring Referring Phys: [id number]  1. Left ventricular ejection fraction, by  estimation, is 60 to 65%. The left ventricle has normal function. The left ventricle has no regional wall motion abnormalities. There is severe left ventricular hypertrophy. Left ventricular diastolic parameters  are consistent with Grade I diastolic dysfunction (impaired relaxation).  2. Right ventricular systolic function is normal. The right ventricular size is normal. Tricuspid regurgitation signal is inadequate for assessing PA pressure.  3. The mitral valve  is grossly normal. No evidence of mitral valve regurgitation.  4. The aortic valve is calcified. Aortic valve regurgitation is moderate. No aortic stenosis is present. Aortic regurgitation PHT measures 389 msec. Aortic valve mean gradient measures 8.0 mmHg.  5. The inferior vena cava is normal in size with greater than 50% respiratory variability, suggesting right atrial pressure of 3 mmHg.  6. Agitated saline contrast bubble study was negative, with no evidence of any interatrial shunt. Comparison(s): No prior Echocardiogram. FINDINGS  Left Ventricle: Left ventricular ejection fraction, by estimation, is 60 to 65%. The left ventricle has normal function. The left ventricle has no regional wall motion abnormalities. The left ventricular internal cavity size was normal in size. There is  severe left ventricular hypertrophy. Left ventricular diastolic parameters are consistent with Grade I diastolic dysfunction (impaired relaxation). Right Ventricle: The right ventricular size is normal. No increase in right ventricular wall thickness. Right ventricular systolic function is normal. Tricuspid regurgitation signal is inadequate for assessing PA pressure. Left Atrium: Left atrial size was normal in size. Right Atrium: Right atrial size was normal in size. Pericardium: There is no evidence of pericardial effusion. Mitral Valve: The mitral valve is grossly normal. There is mild calcification of the mitral valve leaflet(s). No evidence of mitral valve  regurgitation. Tricuspid Valve: The tricuspid valve is grossly normal. Tricuspid valve regurgitation is not demonstrated. Aortic Valve: The aortic valve is calcified. Aortic valve regurgitation is moderate. Aortic regurgitation PHT measures 389 msec. No aortic stenosis is present. Aortic valve mean gradient measures 8.0 mmHg. Aortic valve peak gradient measures 15.5 mmHg. Aortic valve area, by VTI measures 1.39 cm. Pulmonic Valve: The pulmonic valve was grossly normal. Pulmonic valve regurgitation is trivial. Aorta: The aortic root and ascending aorta are structurally normal, with no evidence of dilitation. Venous: The inferior vena cava is normal in size with greater than 50% respiratory variability, suggesting right atrial pressure of 3 mmHg. IAS/Shunts: No atrial level shunt detected by color flow Doppler. Agitated saline contrast was given intravenously to evaluate for intracardiac shunting. Agitated saline contrast bubble study was negative, with no evidence of any interatrial shunt.  LEFT VENTRICLE PLAX 2D LVIDd:         4.40 cm   Diastology LVIDs:         2.80 cm   LV e' medial:    6.31 cm/s LV PW:         2.00 cm   LV E/e' medial:  11.3 LV IVS:        1.80 cm   LV e' lateral:   6.42 cm/s LVOT diam:     1.65 cm   LV E/e' lateral: 11.1 LV SV:         56 LV SV Index:   33 LVOT Area:     2.14 cm                           3D Volume EF:                          3D EF:        52 %                          LV EDV:       188 ml  LV ESV:       90 ml                          LV SV:        98 ml RIGHT VENTRICLE             IVC RV Basal diam:  3.80 cm     IVC diam: 1.80 cm RV Mid diam:    2.30 cm RV S prime:     15.90 cm/s LEFT ATRIUM             Index        RIGHT ATRIUM           Index LA diam:        4.10 cm 2.43 cm/m   RA Area:     13.60 cm LA Vol (A2C):   49.1 ml 29.06 ml/m  RA Volume:   33.50 ml  19.83 ml/m LA Vol (A4C):   50.2 ml 29.71 ml/m LA Biplane Vol: 51.0 ml 30.19 ml/m  AORTIC  VALVE                     PULMONIC VALVE AV Area (Vmax):    1.61 cm      PV Vmax:       1.09 m/s AV Area (Vmean):   1.37 cm      PV Peak grad:  4.8 mmHg AV Area (VTI):     1.39 cm AV Vmax:           197.00 cm/s AV Vmean:          135.000 cm/s AV VTI:            0.401 m AV Peak Grad:      15.5 mmHg AV Mean Grad:      8.0 mmHg LVOT Vmax:         148.00 cm/s LVOT Vmean:        86.500 cm/s LVOT VTI:          0.261 m LVOT/AV VTI ratio: 0.65 AI PHT:            389 msec  AORTA Ao Root diam: 3.20 cm Ao Asc diam:  3.30 cm MITRAL VALVE MV Area (PHT): 3.63 cm    SHUNTS MV Decel Time: 209 msec    Systemic VTI:  0.26 m MV E velocity: 71.30 cm/s  Systemic Diam: 1.65 cm MV A velocity: 83.90 cm/s MV E/A ratio:  0.85 Mary Scientist, physiological signed by Phineas Inches Signature Date/Time: 11/18/2021/8:59:54 AM    Final    VAS US CAROTID  Result Date: 11/18/2021 Carotid Arterial Duplex Study Patient Name:  Manuel Kramer  Date of Exam:   11/18/2021 Medical Rec #: [id number]         Accession #:    [id number] Date of Birth: 1956/09/30         Patient Gender: M Patient Age:   65 years Exam Location:  [facility] Procedure:      VAS US CAROTID Referring Phys: Alferd Patee [facility] --------------------------------------------------------------------------------  Indications:       CVA. Risk Factors:      Hypertension, hyperlipidemia, Diabetes, current smoker. Limitations        Today's exam was limited due to patient movement. Comparison Study:  No prior studies. Performing Technologist: Darlin Coco RDMS, RVT  Examination Guidelines: A complete evaluation includes B-mode imaging, spectral Doppler, color Doppler, and power  Doppler as needed of all accessible portions of each vessel. Bilateral testing is considered an integral part of a complete examination. Limited examinations for reoccurring indications may be performed as noted.  Right Carotid Findings: +----------+--------+--------+--------+------------------+--------+              PSV cm/s EDV cm/s Stenosis Plaque Description Comments  +----------+--------+--------+--------+------------------+--------+  CCA Prox   96       16                                             +----------+--------+--------+--------+------------------+--------+  CCA Distal 79       16                                             +----------+--------+--------+--------+------------------+--------+  ICA Prox   83       16                                             +----------+--------+--------+--------+------------------+--------+  ICA Distal 70       26                                             +----------+--------+--------+--------+------------------+--------+  ECA        84                                                      +----------+--------+--------+--------+------------------+--------+ +----------+--------+-------+----------------+-------------------+             PSV cm/s EDV cms Describe         Arm Pressure (mmHG)  +----------+--------+-------+----------------+-------------------+  Subclavian 89               Multiphasic, WNL                      +----------+--------+-------+----------------+-------------------+ +---------+--------+--+--------+--+---------+  Vertebral PSV cm/s 37 EDV cm/s 13 Antegrade  +---------+--------+--+--------+--+---------+  Left Carotid Findings: +----------+--------+--------+--------+-------------------------------+--------+             PSV cm/s EDV cm/s Stenosis Plaque Description              Comments  +----------+--------+--------+--------+-------------------------------+--------+  CCA Prox   110      19                                                          +----------+--------+--------+--------+-------------------------------+--------+  CCA Distal 69       13                                                          +----------+--------+--------+--------+-------------------------------+--------+  ICA Prox   65       23       1-39%    calcific and heterogenous ,                                                       mild                                      +----------+--------+--------+--------+-------------------------------+--------+  ICA Distal 86       21                                                          +----------+--------+--------+--------+-------------------------------+--------+  ECA        65       10                                                          +----------+--------+--------+--------+-------------------------------+--------+ +----------+--------+--------+----------------+-------------------+             PSV cm/s EDV cm/s Describe         Arm Pressure (mmHG)  +----------+--------+--------+----------------+-------------------+  Subclavian 140               Multiphasic, WNL                      +----------+--------+--------+----------------+-------------------+ +---------+--------+--+--------+--+---------+  Vertebral PSV cm/s 38 EDV cm/s 13 Antegrade  +---------+--------+--+--------+--+---------+   Summary: Right Carotid: The extracranial vessels were near-normal with only minimal wall                thickening or plaque. Left Carotid: Velocities in the left ICA are consistent with a 1-39% stenosis. Vertebrals:  Bilateral vertebral arteries demonstrate antegrade flow. Subclavians: Normal flow hemodynamics were seen in bilateral subclavian              arteries. *See table(s) above for measurements and observations.  Electronically signed by Delia HeadyPramod Sethi MD on 11/18/2021 at 1:45:56 PM.    Final    CT ANGIO HEAD NECK W WO CM W PERF  Addendum Date: 11/14/2021   ADDENDUM REPORT: 11/14/2021 06:08 ADDENDUM: Multiphase CT imaging of the brain was performed following IV bolus contrast injection. Subsequent parametric perfusion maps were calculated using RAPID software. CT Brain Perfusion Findings: ASPECTS: 10 CBF (<30%) Volume: 0mL Perfusion (Tmax>6.0s) volume: 0mL Electronically Signed   By: Tiburcio PeaJonathan  Watts M.D.   On: 11/14/2021 06:08   Result Date:  11/14/2021 CLINICAL DATA:  Neuro deficit with stroke suspected EXAM: CT ANGIOGRAPHY HEAD AND NECK TECHNIQUE: Multidetector CT imaging of the head and neck was performed using the standard protocol during bolus administration of intravenous contrast. Multiplanar CT image reconstructions and MIPs were obtained to evaluate the vascular anatomy. Carotid stenosis measurements (when applicable) are obtained utilizing NASCET criteria, using the distal internal carotid diameter as the denominator. RADIATION DOSE REDUCTION: This exam was performed according  to the departmental dose-optimization program which includes automated exposure control, adjustment of the mA and/or kV according to patient size and/or use of iterative reconstruction technique. CONTRAST:  72mL OMNIPAQUE IOHEXOL 350 MG/ML SOLN COMPARISON:  Noncontrast head CT from earlier today FINDINGS: CTA NECK FINDINGS Aortic arch: No acute finding or dilatation. Three vessel branching. Right carotid system: Atheromatous wall thickening proximally. No flow limiting stenosis or ulceration. Left carotid system: Mild proximal atheromatous wall thickening with mild calcified plaque at the bifurcation. No flow limiting stenosis or ulceration. Vertebral arteries: No proximal subclavian stenosis. Left dominant vertebral artery. Both vertebral arteries are smoothly contoured and widely patent to the dura. Skeleton: No acute finding Other neck: Subcutaneous reticulation which appears dependent, likely third-spacing. Upper chest: No acute finding Review of the MIP images confirms the above findings CTA HEAD FINDINGS Anterior circulation: Atheromatous calcification on the carotid siphons. Right atheromatous supraclinoid ICA narrowing not reaching 50% stenosis by measurement. No branch occlusion, beading, or aneurysm. Posterior circulation: Dominant left vertebral artery. Vertebral and basilar arteries are smoothly contoured and widely patent. No branch occlusion, beading, or  aneurysm. Venous sinuses: Diffusely patent as permitted by contrast timing Anatomic variants: None significant Review of the MIP images confirms the above findings IMPRESSION: 1. No emergent finding. 2. Atherosclerosis without flow limiting stenosis of major vessels in the head and neck. Electronically Signed: By: Jorje Guild M.D. On: 11/14/2021 06:01    Microbiology: Results for orders placed or performed during the hospital encounter of 11/14/21  Blood Culture (routine x 2)     Status: None   Collection Time: 11/14/21  4:09 AM   Specimen: BLOOD RIGHT FOREARM  Result Value Ref Range Status   Specimen Description BLOOD RIGHT FOREARM  Final   Special Requests   Final    BOTTLES DRAWN AEROBIC AND ANAEROBIC Blood Culture adequate volume   Culture   Final    NO GROWTH 5 DAYS Performed at [facility], [place] [address], [place], [place] [address]    Report Status 11/19/2021 FINAL  Final  Resp Panel by RT-PCR (Flu A&B, Covid) Nasopharyngeal Swab     Status: None   Collection Time: 11/14/21  4:44 AM   Specimen: Nasopharyngeal Swab; Nasopharyngeal(NP) swabs in vial transport medium  Result Value Ref Range Status   SARS Coronavirus 2 by RT PCR NEGATIVE NEGATIVE Final    Comment: (NOTE) SARS-CoV-2 target nucleic acids are NOT DETECTED.  The SARS-CoV-2 RNA is generally detectable in upper respiratory specimens during the acute phase of infection. The lowest concentration of SARS-CoV-2 viral copies this assay can detect is 138 copies/mL. A negative result does not preclude SARS-Cov-2 infection and should not be used as the sole basis for treatment or other patient management decisions. A negative result may occur with  improper specimen collection/handling, submission of specimen other than nasopharyngeal swab, presence of viral mutation(s) within the areas targeted by this assay, and inadequate number of viral copies(<138 copies/mL). A negative result must be combined with clinical  observations, patient history, and epidemiological information. The expected result is Negative.  Fact Sheet for Patients:  EntrepreneurPulse.com.au  Fact Sheet for Healthcare Providers:  IncredibleEmployment.be  This test is no t yet approved or cleared by the [place] FDA and  has been authorized for detection and/or diagnosis of SARS-CoV-2 by FDA under an Emergency Use Authorization (EUA). This EUA will remain  in effect (meaning this test can be used) for the duration of the COVID-19 declaration under Section 564(b)(1) of the  Act, 21 U.S.C.section 360bbb-3(b)(1), unless the authorization is terminated  or revoked sooner.       Influenza A by PCR NEGATIVE NEGATIVE Final   Influenza B by PCR NEGATIVE NEGATIVE Final    Comment: (NOTE) The Xpert Xpress SARS-CoV-2/FLU/RSV plus assay is intended as an aid in the diagnosis of influenza from Nasopharyngeal swab specimens and should not be used as a sole basis for treatment. Nasal washings and aspirates are unacceptable for Xpert Xpress SARS-CoV-2/FLU/RSV testing.  Fact Sheet for Patients: EntrepreneurPulse.com.au  Fact Sheet for Healthcare Providers: IncredibleEmployment.be  This test is not yet approved or cleared by the [place] FDA and has been authorized for detection and/or diagnosis of SARS-CoV-2 by FDA under an Emergency Use Authorization (EUA). This EUA will remain in effect (meaning this test can be used) for the duration of the COVID-19 declaration under Section 564(b)(1) of the Act, 21 U.S.C. section 360bbb-3(b)(1), unless the authorization is terminated or revoked.  Performed at [facility], [place] [address], [place], [place] [address]   Blood Culture (routine x 2)     Status: None   Collection Time: 11/14/21  7:28 AM   Specimen: Site Not Specified; Blood  Result Value Ref Range Status   Specimen Description SITE NOT SPECIFIED   Final   Special Requests   Final    BOTTLES DRAWN AEROBIC AND ANAEROBIC Blood Culture adequate volume   Culture   Final    NO GROWTH 5 DAYS Performed at [facility], [place] [address], [place], [place] [address]    Report Status 11/19/2021 FINAL  Final  MRSA Next Gen by PCR, Nasal     Status: None   Collection Time: 11/14/21  1:14 PM   Specimen: Nasal Mucosa; Nasal Swab  Result Value Ref Range Status   MRSA by PCR Next Gen NOT DETECTED NOT DETECTED Final    Comment: (NOTE) The GeneXpert MRSA Assay (FDA approved for NASAL specimens only), is one component of a comprehensive MRSA colonization surveillance program. It is not intended to diagnose MRSA infection nor to guide or monitor treatment for MRSA infections. Test performance is not FDA approved in patients less than 4 years old. Performed at [facility], [place] [address], [place], [place] [address]   CSF culture w Stat Gram Stain     Status: None   Collection Time: 11/14/21  2:54 PM   Specimen: CSF; Cerebrospinal Fluid  Result Value Ref Range Status   Specimen Description CSF  Final   Special Requests NONE  Final   Gram Stain   Final    WBC PRESENT,BOTH PMN AND MONONUCLEAR NO ORGANISMS SEEN    Culture   Final    NO GROWTH 3 DAYS Performed at [facility], [place] [address], [place], [place] [address]    Report Status 11/18/2021 FINAL  Final  VZV PCR, CSF     Status: None   Collection Time: 11/14/21  5:08 PM   Specimen: CSF; Cerebrospinal Fluid  Result Value Ref Range Status   VZV PCR, CSF Negative Negative Final    Comment: (NOTE) No Varicella Zoster Virus DNA detected. Performed At: [facility] [place], [place] [id number] Rush Farmer MD [id number]   Gastrointestinal Panel by PCR , Stool     Status: None   Collection Time: 11/28/21 11:49 PM   Specimen: Stool  Result Value Ref Range Status   Campylobacter species NOT DETECTED NOT DETECTED Final   Plesimonas  shigelloides  NOT DETECTED NOT DETECTED Final   Salmonella species NOT DETECTED NOT DETECTED Final   Yersinia enterocolitica NOT DETECTED NOT DETECTED Final   Vibrio species NOT DETECTED NOT DETECTED Final   Vibrio cholerae NOT DETECTED NOT DETECTED Final   Enteroaggregative E coli (EAEC) NOT DETECTED NOT DETECTED Final   Enteropathogenic E coli (EPEC) NOT DETECTED NOT DETECTED Final   Enterotoxigenic E coli (ETEC) NOT DETECTED NOT DETECTED Final   Shiga like toxin producing E coli (STEC) NOT DETECTED NOT DETECTED Final   Shigella/Enteroinvasive E coli (EIEC) NOT DETECTED NOT DETECTED Final   Cryptosporidium NOT DETECTED NOT DETECTED Final   Cyclospora cayetanensis NOT DETECTED NOT DETECTED Final   Entamoeba histolytica NOT DETECTED NOT DETECTED Final   Giardia lamblia NOT DETECTED NOT DETECTED Final   Adenovirus F40/41 NOT DETECTED NOT DETECTED Final   Astrovirus NOT DETECTED NOT DETECTED Final   Norovirus GI/GII NOT DETECTED NOT DETECTED Final   Rotavirus A NOT DETECTED NOT DETECTED Final   Sapovirus (I, II, IV, and V) NOT DETECTED NOT DETECTED Final    Comment: Performed at [facility], [address] [place], [place] [address]  Resp Panel by RT-PCR (Flu A&B, Covid) Nasopharyngeal Swab     Status: None   Collection Time: 11/30/21  9:07 AM   Specimen: Nasopharyngeal Swab; Nasopharyngeal(NP) swabs in vial transport medium  Result Value Ref Range Status   SARS Coronavirus 2 by RT PCR NEGATIVE NEGATIVE Final    Comment: (NOTE) SARS-CoV-2 target nucleic acids are NOT DETECTED.  The SARS-CoV-2 RNA is generally detectable in upper respiratory specimens during the acute phase of infection. The lowest concentration of SARS-CoV-2 viral copies this assay can detect is 138 copies/mL. A negative result does not preclude SARS-Cov-2 infection and should not be used as the sole basis for treatment or other patient management decisions. A negative result may occur with  improper  specimen collection/handling, submission of specimen other than nasopharyngeal swab, presence of viral mutation(s) within the areas targeted by this assay, and inadequate number of viral copies(<138 copies/mL). A negative result must be combined with clinical observations, patient history, and epidemiological information. The expected result is Negative.  Fact Sheet for Patients:  EntrepreneurPulse.com.au  Fact Sheet for Healthcare Providers:  IncredibleEmployment.be  This test is no t yet approved or cleared by the [place] FDA and  has been authorized for detection and/or diagnosis of SARS-CoV-2 by FDA under an Emergency Use Authorization (EUA). This EUA will remain  in effect (meaning this test can be used) for the duration of the COVID-19 declaration under Section 564(b)(1) of the Act, 21 U.S.C.section 360bbb-3(b)(1), unless the authorization is terminated  or revoked sooner.       Influenza A by PCR NEGATIVE NEGATIVE Final   Influenza B by PCR NEGATIVE NEGATIVE Final    Comment: (NOTE) The Xpert Xpress SARS-CoV-2/FLU/RSV plus assay is intended as an aid in the diagnosis of influenza from Nasopharyngeal swab specimens and should not be used as a sole basis for treatment. Nasal washings and aspirates are unacceptable for Xpert Xpress SARS-CoV-2/FLU/RSV testing.  Fact Sheet for Patients: EntrepreneurPulse.com.au  Fact Sheet for Healthcare Providers: IncredibleEmployment.be  This test is not yet approved or cleared by the [place] FDA and has been authorized for detection and/or diagnosis of SARS-CoV-2 by FDA under an Emergency Use Authorization (EUA). This EUA will remain in effect (meaning this test can be used) for the duration of the COVID-19 declaration under Section 564(b)(1) of the Act, 21 U.S.C.  section 360bbb-3(b)(1), unless the authorization is terminated or revoked.  Performed at  [facility], [place] [address] [place], [place] [address]   Resp Panel by RT-PCR (Flu A&B, Covid) Nasopharyngeal Swab     Status: None   Collection Time: 12/05/21 11:31 AM   Specimen: Nasopharyngeal Swab; Nasopharyngeal(NP) swabs in vial transport medium  Result Value Ref Range Status   SARS Coronavirus 2 by RT PCR NEGATIVE NEGATIVE Final    Comment: (NOTE) SARS-CoV-2 target nucleic acids are NOT DETECTED.  The SARS-CoV-2 RNA is generally detectable in upper respiratory specimens during the acute phase of infection. The lowest concentration of SARS-CoV-2 viral copies this assay can detect is 138 copies/mL. A negative result does not preclude SARS-Cov-2 infection and should not be used as the sole basis for treatment or other patient management decisions. A negative result may occur with  improper specimen collection/handling, submission of specimen other than nasopharyngeal swab, presence of viral mutation(s) within the areas targeted by this assay, and inadequate number of viral copies(<138 copies/mL). A negative result must be combined with clinical observations, patient history, and epidemiological information. The expected result is Negative.  Fact Sheet for Patients:  EntrepreneurPulse.com.au  Fact Sheet for Healthcare Providers:  IncredibleEmployment.be  This test is no t yet approved or cleared by the [place] FDA and  has been authorized for detection and/or diagnosis of SARS-CoV-2 by FDA under an Emergency Use Authorization (EUA). This EUA will remain  in effect (meaning this test can be used) for the duration of the COVID-19 declaration under Section 564(b)(1) of the Act, 21 U.S.C.section 360bbb-3(b)(1), unless the authorization is terminated  or revoked sooner.       Influenza A by PCR NEGATIVE NEGATIVE Final   Influenza B by PCR NEGATIVE NEGATIVE Final    Comment: (NOTE) The Xpert Xpress SARS-CoV-2/FLU/RSV plus assay  is intended as an aid in the diagnosis of influenza from Nasopharyngeal swab specimens and should not be used as a sole basis for treatment. Nasal washings and aspirates are unacceptable for Xpert Xpress SARS-CoV-2/FLU/RSV testing.  Fact Sheet for Patients: EntrepreneurPulse.com.au  Fact Sheet for Healthcare Providers: IncredibleEmployment.be  This test is not yet approved or cleared by the [place] FDA and has been authorized for detection and/or diagnosis of SARS-CoV-2 by FDA under an Emergency Use Authorization (EUA). This EUA will remain in effect (meaning this test can be used) for the duration of the COVID-19 declaration under Section 564(b)(1) of the Act, 21 U.S.C. section 360bbb-3(b)(1), unless the authorization is terminated or revoked.  Performed at [facility], [place] [address], [place], [place] [address]     Labs: CBC: Recent Labs  Lab 12/01/21 [id number] 12/02/21 0237 12/03/21 0250 12/04/21 0054 12/05/21 0048  WBC 10.5 9.6 9.7 11.8* 10.3  NEUTROABS 7.4 6.5 6.1 8.6* 7.1  HGB 8.3* 8.9* 8.2* 8.5* 8.5*  HCT 27.1* 27.8* 25.4* 27.5* 26.7*  MCV 105.4* 103.7* 103.7* 106.2* 105.5*  PLT 320 304 283 291 [id number]   Basic Metabolic Panel: Recent Labs  Lab 11/29/21 0443 11/30/21 0820 12/01/21 0252 12/02/21 0237 12/03/21 0250 12/04/21 0054 12/05/21 0048  NA 140   < > 141 141 140 140 139  K 3.9   < > 4.1 3.9 3.6 3.9 4.2  CL 111   < > 108 108 106 108 108  CO2 23   < > 23 23 25 24 24   GLUCOSE 138*   < > 219* 201* 185* 224* 149*  BUN 18   < >  21 22 21 22  25*  CREATININE 1.53*   < > 1.76* 1.91* 1.83* 1.74* 1.84*  CALCIUM 8.5*   < > 8.5* 8.5* 8.1* 8.0* 8.3*  MG 1.9  --   --   --   --   --   --   PHOS 3.4  --   --   --   --   --   --    < > = values in this interval not displayed.   Liver Function Tests: No results for input(s): AST, ALT, ALKPHOS, BILITOT, PROT, ALBUMIN in the last 168 hours. CBG: Recent Labs  Lab  12/04/21 1144 12/04/21 1559 12/04/21 2115 12/05/21 0630 12/05/21 1244  GLUCAP 170* 129* 143* 127* 307*    Discharge time spent: greater than 30 minutes.  Signed: Alma Friendly, MD [facility] 12/05/2021

## 2021-12-05 NOTE — Progress Notes (Signed)
Physical Therapy Treatment Patient Details Name: Manuel Kramer MRN: [id number] DOB: 1955-11-06 Today's Date: 12/05/2021   History of Present Illness 66 y/o male presented to ED on 11/14/21 after being found in his home altered and combative. Unresponsive in ED. EEG suggestive of severe encephalopathy. 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 Intubated 1/16-1/19. PMH: HTN, DM    PT Comments    Pt progressing towards all goals however requires max encouragement to participate in therapy. Pt c/o pain "everywhere" and "my legs are going to give out" during ambulation. Pt continues with poor STM including how to manage his DM. RN educated pt on carb mod diet and pt started asking PT for a soda and chips. Pt re-educated again. Pt with noted bilat LE edema, benefits from RW for ambulation for energy conservation. Acute PT to cont to follow.   Recommendations for follow up therapy are one component of a multi-disciplinary discharge planning process, led by the attending physician.  Recommendations may be updated based on patient status, additional functional criteria and insurance authorization.  Follow Up Recommendations  Skilled nursing-short term rehab (<3 hours/day)     Assistance Recommended at Discharge Frequent or constant Supervision/Assistance  Patient can return home with the following A little help with walking and/or transfers;A little help with bathing/dressing/bathroom;Direct supervision/assist for medications management;Direct supervision/assist for financial management;Assist for transportation;Help with stairs or ramp for entrance   Equipment Recommendations   (TBD at next venue)    Recommendations for Other Services Rehab consult     Precautions / Restrictions Precautions Precautions: Fall Restrictions Weight Bearing  Restrictions: No     Mobility  Bed Mobility Overal bed mobility: Needs Assistance Bed Mobility: Supine to Sit Rolling: Supervision         General bed mobility comments: increased time, assist to unwrap covers from pt's legs as they were wrapped all the way around and pt unable to problem solve on how to get them out. Pt then supervision for transfer to EOB with directional verbal cues on how to use the bed rail instead of reaching for PT to help    Transfers Overall transfer level: Needs assistance Equipment used: Rolling walker (2 wheels) Transfers: Sit to/from Stand Sit to Stand: Min guard           General transfer comment: verbal cues for safe hand placement, increased time, min guard for safety    Ambulation/Gait Ambulation/Gait assistance: Min assist Gait Distance (Feet): 120 Feet Assistive device: Rolling walker (2 wheels) Gait Pattern/deviations: Step-through pattern, Wide base of support Gait velocity: dec Gait velocity interpretation: <1.31 ft/sec, indicative of household ambulator   General Gait Details: verbal cues to stay in walker, pt with report of onset of "my legs feel like theyre going to give out they hurt so bad" pt then amb from one side of the bed to the other without AD. Pt reports "I am so tired." SpO2 at 99% on RA.   Stairs             Wheelchair Mobility    Modified Rankin (Stroke Patients Only) Modified Rankin (Stroke Patients Only) Pre-Morbid Rankin Score: No symptoms Modified Rankin: Moderately severe disability     Balance Overall balance assessment: Needs assistance Sitting-balance support: Feet unsupported Sitting balance-Leahy Scale: Good Sitting balance - Comments: max encouragement given to open [name] he received. Pt gave up  several times however PT encouraged pt to continue to try. Verbal cues to rip along the perforated line in which pt was then able to complete, pt with noted fine motor deficits Postural  control: Other (comment) (mild posterior list) Standing balance support: During functional activity, Bilateral upper extremity supported Standing balance-Leahy Scale: Fair Standing balance comment: pt benefits from RW for ambulation                            Cognition Arousal/Alertness: Awake/alert Behavior During Therapy: WFL for tasks assessed/performed Overall Cognitive Status: Impaired/Different from baseline Area of Impairment: Attention, Safety/judgement, Awareness, Problem solving                 Orientation Level: Time, Disoriented to Current Attention Level: Sustained Memory: Decreased short-term memory Following Commands: Follows one step commands with increased time, Follows multi-step commands inconsistently Safety/Judgement: Decreased awareness of safety, Decreased awareness of deficits Awareness: Emergent Problem Solving: Slow processing, Decreased initiation General Comments: pt requiring max encouragement to participate, requiring PT to come back to work with him. Pt with little effort to help self, when PT asked pt to do all things pt states "I'm being lazy aren't I. You get lazy in here."        Exercises      General Comments General comments (skin integrity, edema, etc.): VSS on RA      Pertinent Vitals/Pain Pain Assessment Pain Assessment: Faces Faces Pain Scale: Hurts a little bit Pain Location: pt reports "I have pain everywhere when I walk" Pain Descriptors / Indicators: Discomfort, Grimacing Pain Intervention(s): Monitored during session    Home Living                          Prior Function            PT Goals (current goals can now be found in the care plan section) Acute Rehab PT Goals Patient Stated Goal: unable to state PT Goal Formulation: With patient Time For Goal Achievement: 12/19/21 Potential to Achieve Goals: Good Progress towards PT goals: Progressing toward goals    Frequency    Min  3X/week      PT Plan Discharge plan needs to be updated;Frequency needs to be updated    Co-evaluation              AM-PAC PT "6 Clicks" Mobility   Outcome Measure  Help needed turning from your back to your side while in a flat bed without using bedrails?: A Little Help needed moving from lying on your back to sitting on the side of a flat bed without using bedrails?: A Little Help needed moving to and from a bed to a chair (including a wheelchair)?: A Little Help needed standing up from a chair using your arms (e.g., wheelchair or bedside chair)?: A Little Help needed to walk in hospital room?: A Little Help needed climbing 3-5 steps with a railing? : A Lot 6 Click Score: 17    End of Session Equipment Utilized During Treatment: Gait belt Activity Tolerance: Patient limited by pain;Patient limited by fatigue Patient left: with call bell/phone within reach;in chair;with chair alarm set Nurse Communication: Mobility status PT Visit Diagnosis: Muscle weakness (generalized) (M62.81);Other abnormalities of gait and mobility (R26.89);Other symptoms and signs involving the nervous system (R29.898)     Time: 1049-1101 PT Time Calculation (min) (ACUTE ONLY): 12 min  Charges:  $Gait Training: 8-22 mins  Kittie Plater, PT, DPT Acute Rehabilitation Services Pager #: [phone] Office #: [phone]    Berline Lopes 12/05/2021, 11:18 AM

## 2021-12-05 NOTE — Progress Notes (Signed)
Occupational Therapy Treatment Patient Details Name: Manuel Kramer MRN: [id number] DOB: 12-08-1955 Today's Date: 12/05/2021   History of present illness 66 y/o male presented to ED on 11/14/21 after being found in his home altered and combative. Unresponsive in ED. EEG suggestive of severe encephalopathy. 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 Intubated 1/16-1/19. PMH: HTN, DM   OT comments  Manuel Kramer has met his acute OT goals, see care plan fro updated OT goals. He continues to required encouragement to participate and needs moderate cues throughout for safety and re-directing to task. Overall he is supervision level for ADLs and mobility within the room but is significantly limited by STM and insight. Pt continues to benefit from OT acutely. D/c recommendation remains appropriate.    Recommendations for follow up therapy are one component of a multi-disciplinary discharge planning process, led by the attending physician.  Recommendations may be updated based on patient status, additional functional criteria and insurance authorization.    Follow Up Recommendations  Skilled nursing-short term rehab (<3 hours/day)    Assistance Recommended at Discharge Frequent or constant Supervision/Assistance  Patient can return home with the following  Direct supervision/assist for financial management;Assistance with cooking/housework;Assist for transportation;Help with stairs or ramp for entrance;A little help with walking and/or transfers;A little help with bathing/dressing/bathroom;Direct supervision/assist for medications management   Equipment Recommendations  Other (comment)    Recommendations for Other Services      Precautions / Restrictions Precautions Precautions: Fall Restrictions Weight Bearing Restrictions: No       Mobility Bed  Mobility               General bed mobility comments: in chair upon arrival    Transfers Overall transfer level: Needs assistance Equipment used: None Transfers: Sit to/from Stand Sit to Stand: Supervision           General transfer comment: incrased time for motivation, pt reported that he was dizzy upon standing     Balance Overall balance assessment: Needs assistance Sitting-balance support: Feet unsupported Sitting balance-Leahy Scale: Good     Standing balance support: During functional activity, No upper extremity supported Standing balance-Leahy Scale: Fair                             ADL either performed or assessed with clinical judgement   ADL       Grooming: Supervision/safety;Standing;Cueing for safety;Cueing for sequencing Grooming Details (indicate cue type and reason): at the sink to groom, benefits from cues for safety and sequencing. pt impulisve with some tasks                 Toilet Transfer: Supervision/safety;Ambulation Toilet Transfer Details (indicate cue type and reason): stood to [address] and Hygiene: Supervision/safety;Sit to/from stand Toileting - Clothing Manipulation Details (indicate cue type and reason): stood at toilet to pee - flushed 2x     Functional mobility during ADLs: Supervision/safety;Cueing for sequencing;Cueing for safety General ADL Comments: requires max encouragement, limited motivation to participate in ADLs "they do that for me." pt's lunch tray came during session and it was difficult to re-direct him from eating    Extremity/Trunk Assessment Upper Extremity Assessment RUE Deficits / Details: generally weak, coordination is mildly slow however RUE is [facility] for feeding and grooming tasks assessed LUE Deficits / Details:  generally weak but [facility]   Lower Extremity Assessment Lower Extremity Assessment: Defer to PT evaluation        Vision   Vision Assessment?: No  apparent visual deficits   Perception Perception Perception: Impaired   Praxis Praxis Praxis: Not tested    Cognition Arousal/Alertness: Awake/alert Behavior During Therapy: WFL for tasks assessed/performed Overall Cognitive Status: Impaired/Different from baseline Area of Impairment: Attention, Safety/judgement, Awareness, Problem solving                 Orientation Level: Time, Disoriented to Current Attention Level: Sustained Memory: Decreased short-term memory Following Commands: Follows one step commands with increased time, Follows multi-step commands inconsistently Safety/Judgement: Decreased awareness of safety, Decreased awareness of deficits Awareness: Emergent Problem Solving: Slow processing, Decreased initiation General Comments: pt continues to require max encouragement to participate. stating "wow" after each time he is re-oriented. offered pt to clean and don new clothes as his were dirty, pt declining stating "ill change when I go home" & "these well be okay for one more day." Pt continues to lack insight and needs constant cues for safety and re-driection        Exercises      Shoulder Instructions       General Comments VSS on RA    Pertinent Vitals/ Pain       Pain Assessment Pain Assessment: No/denies pain Pain Intervention(s): Monitored during session   Frequency  Min 2X/week        Progress Toward Goals  OT Goals(current goals can now be found in the care plan section)  Progress towards OT goals: Goals met and updated - see care plan  Acute Rehab OT Goals Patient Stated Goal: to go home OT Goal Formulation: With patient Time For Goal Achievement: 12/16/21 Potential to Achieve Goals: Good ADL Goals Pt Will Perform Eating: Independently Pt Will Perform Grooming: Independently;standing Pt Will Transfer to Toilet: Independently;regular height toilet Pt Will Perform Toileting - Clothing Manipulation and hygiene: Independently;sit  to/from stand Additional ADL Goal #1: pt will indep recall at least 3 fall prevention strategies to apply at d/c  Plan Discharge plan remains appropriate       AM-PAC OT "6 Clicks" Daily Activity     Outcome Measure   Help from another person eating meals?: A Little Help from another person taking care of personal grooming?: A Little Help from another person toileting, which includes using toliet, bedpan, or urinal?: A Little Help from another person bathing (including washing, rinsing, drying)?: A Little Help from another person to put on and taking off regular upper body clothing?: A Little Help from another person to put on and taking off regular lower body clothing?: A Little 6 Click Score: 18    End of Session Equipment Utilized During Treatment: Gait belt  OT Visit Diagnosis: Other symptoms and signs involving the nervous system (R29.898);Other symptoms and signs involving cognitive function;Cognitive communication deficit (R41.841) Symptoms and signs involving cognitive functions: Cerebral infarction   Activity Tolerance Patient tolerated treatment well   Patient Left in chair;with chair alarm set;with call bell/phone within reach   Nurse Communication Mobility status        Time: 0071-2197 OT Time Calculation (min): 19 min  Charges: OT General Charges $OT Visit: 1 Visit OT Treatments $Self Care/Home Management : 8-22 mins   Manuel Kramer A Caidyn Henricksen 12/05/2021, 12:24 PM

## 2021-12-28 ENCOUNTER — Encounter: Payer: Self-pay | Admitting: Adult Health

## 2021-12-28 ENCOUNTER — Ambulatory Visit (INDEPENDENT_AMBULATORY_CARE_PROVIDER_SITE_OTHER): Payer: Medicaid Other | Admitting: Adult Health

## 2021-12-28 VITALS — BP 189/99 | HR 75 | Ht 62.0 in | Wt 160.6 lb

## 2021-12-28 DIAGNOSIS — E785 Hyperlipidemia, unspecified: Secondary | ICD-10-CM | POA: Diagnosis not present

## 2021-12-28 DIAGNOSIS — I10 Essential (primary) hypertension: Secondary | ICD-10-CM

## 2021-12-28 DIAGNOSIS — I6381 Other cerebral infarction due to occlusion or stenosis of small artery: Secondary | ICD-10-CM | POA: Diagnosis not present

## 2021-12-28 DIAGNOSIS — E139 Other specified diabetes mellitus without complications: Secondary | ICD-10-CM

## 2021-12-28 NOTE — Progress Notes (Signed)
PATIENT: Manuel Kramer DOB: 1956-09-16  REASON FOR VISIT: follow up HISTORY FROM: patient PRIMARY NEUROLOGIST: Dr. 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  The patient's medical alarm system went off and he was found unresponsive for an unknown by EMS. Taken to the hospital on 10/30/2021  CT scan of the head showed no acute abnormality--Mild cortical atrophy and mild-to-moderate chronic ischemic white matter changes. CTA head and neck showed atherosclerosis with flow-limiting stenosis of major vessels in the head and neck MRA of the head was unremarkable. MRI of the head showed subcentimeter acute infarcts within the right frontal operculum,genu of right internal capsule, right frontoparietal periventricular white matter, and posterior right frontal lobe subcortical white Matter. 6 mm subacute infarct within right frontoparietal periventricular white matter. 2D echo showed ejection fraction of 60 to 65% bubble study was negative.  Blood work showed that LDL was 88, hemoglobin A1c 9.9 patient was not on any antithrombotic medication prior to admission.  He was placed on aspirin but not dual antiplatelet therapy due to numerous microhemorrhages on MRI- most likely due to uncontrolled HTN.  Patient was hypertensive while in the hospital.  Medications was adjusted.  Patient was also increased on Lipitor 10 mg to 40 mg.   Delirium with hallucination noted in the hospital.  Dr. Roda Shutters felt that may be underlying vascular dementia.     TODAY 12/28/21: Patient here alone. Currently at SNF- [facility].  Patient does not recall why he went to the hospital.  He was unaware that he had a stroke.  The patient reports that he has an ex-wife and daughter but does not see them frequently.  Patient was unable to give  me a lot of information today.  Reports that overall he feels that he is doing well.  Looking forward to being able to go back home.      REVIEW OF SYSTEMS: Out of a complete 14 system review of symptoms, the patient complains only of the following symptoms, and all other reviewed systems are negative.  ALLERGIES: No Known Allergies  HOME MEDICATIONS: Outpatient Medications Prior to Visit  Medication Sig Dispense Refill   acetaminophen (TYLENOL) 500 MG tablet Take 1 tablet (500 mg total) by mouth every 6 (six) hours as needed. (Patient taking differently: Take 500 mg by mouth every 6 (six) hours as needed for mild pain or moderate pain. ) 30 tablet 0   amLODipine (NORVASC) 10 MG tablet Take 1 tablet (10 mg total) by mouth daily. 30 tablet 0   aspirin 81 MG chewable tablet Chew 1 tablet (81 mg total) by mouth daily. 30 tablet 0   atorvastatin (LIPITOR) 40 MG tablet Take 1 tablet (40 mg total) by mouth daily.     carvedilol (COREG) 25 MG tablet Take 1 tablet (25 mg total) by mouth 2 (two) times daily with a meal.     cloNIDine (CATAPRES) 0.1 MG tablet Take 1 tablet (0.1 mg total) by mouth 2 (two) times daily. 60 tablet 0   Empagliflozin-linaGLIPtin (GLYXAMBI PO) Take 1 tablet by mouth daily.     furosemide (LASIX) 40 MG tablet Take 1 tablet (40 mg total) by mouth daily. 30 tablet 0   hydrALAZINE (APRESOLINE) 100 MG tablet Take 1 tablet (100 mg total) by mouth every 8 (eight) hours.     insulin glargine (  LANTUS) 100 UNIT/ML Solostar Pen Inject 45 Units into the skin at bedtime. 15 mL 11   loperamide (IMODIUM) 2 MG capsule Take 1 capsule (2 mg total) by mouth as needed for diarrhea or loose stools. 30 capsule 0   Multiple Vitamin (MULTIVITAMIN WITH MINERALS) TABS tablet Take 1 tablet by mouth daily.     No facility-administered medications prior to visit.    PAST MEDICAL HISTORY: Past Medical History:  Diagnosis Date   Chronic diarrhea    Diabetes mellitus without complication (HCC)     Hypertension     PAST SURGICAL HISTORY: Past Surgical History:  Procedure Laterality Date   COLOSCOPY      POLYP    FAMILY HISTORY: Family History  Problem Relation Age of Onset   Cancer Mother    Diabetes Mother    Hypertension Mother    Cancer Father     SOCIAL HISTORY: Social History   Socioeconomic History   Marital status: Legally Separated    Spouse name: Not on file   Number of children: Not on file   Years of education: Not on file   Highest education level: Not on file  Occupational History   Not on file  Tobacco Use   Smoking status: Every Day    Packs/day: 0.50    Years: 40.00    Pack years: 20.00    Types: Cigarettes   Smokeless tobacco: Former  Building services engineer Use: Former  Substance and Sexual Activity   Alcohol use: Not Currently    Alcohol/week: 1.0 standard drink    Types: 1 Cans of beer per week    Comment: no etoh in 4-5 months   Drug use: No   Sexual activity: Not on file  Other Topics Concern   Not on file  Social History Narrative   Not on file   Social Determinants of Health   Financial Resource Strain: Not on file  Food Insecurity: Not on file  Transportation Needs: Not on file  Physical Activity: Not on file  Stress: Not on file  Social Connections: Not on file  Intimate Partner Violence: Not on file      PHYSICAL EXAM  Vitals:   12/28/21 0913  BP: (!) 189/99  Pulse: 75  Weight: 160 lb 9.6 oz (72.8 kg)  Height: 5\' 2"  (1.575 m)   Body mass index is 29.37 kg/m.  MMSE - Mini Mental State Exam 12/28/2021  Orientation to time 5  Orientation to Place 4  Registration 3  Attention/ Calculation 5  Recall 1  Language- name 2 objects 2  Language- repeat 1  Language- follow 3 step command 3  Language- read & follow direction 1  Write a sentence 1  Copy design 1  Total score 27     Generalized: Well developed, in no acute distress. 2-3+ pitting edema BLE  Neurological examination  Mentation: Alert oriented to  time, place, history taking. Follows all commands speech and language fluent Cranial nerve II-XII: Pupils were equal round reactive to light. Extraocular movements were full, visual field were full on confrontational test. Facial sensation and strength were normal. Uvula tongue midline. Head turning and shoulder shrug  were normal and symmetric. Motor: The motor testing reveals 5 over 5 strength of all 4 extremities. Good symmetric motor tone is noted throughout.  Sensory: Sensory testing is intact to soft touch on all 4 extremities. No evidence of extinction is noted.  Coordination: Cerebellar testing reveals good finger-nose-finger and heel-to-shin bilaterally.  Gait and station: Gait is slow and cautious. Tandem gait not attempted. Reflexes: Deep tendon reflexes are symmetric and normal bilaterally.   DIAGNOSTIC DATA (LABS, IMAGING, TESTING) - I reviewed patient records, labs, notes, testing and imaging myself where available.  Lab Results  Component Value Date   WBC 10.3 12/05/2021   HGB 8.5 (L) 12/05/2021   HCT 26.7 (L) 12/05/2021   MCV 105.5 (H) 12/05/2021   PLT 287 12/05/2021      Component Value Date/Time   NA 139 12/05/2021 0048   K 4.2 12/05/2021 0048   CL 108 12/05/2021 0048   CO2 24 12/05/2021 0048   GLUCOSE 149 (H) 12/05/2021 0048   BUN 25 (H) 12/05/2021 0048   CREATININE 1.84 (H) 12/05/2021 0048   CREATININE 1.10 07/01/2015 1140   CALCIUM 8.3 (L) 12/05/2021 0048   PROT 5.9 (L) 11/14/2021 0420   ALBUMIN 3.0 (L) 11/14/2021 0420   AST 22 11/14/2021 0420   ALT 23 11/14/2021 0420   ALKPHOS 97 11/14/2021 0420   BILITOT 1.0 11/14/2021 0420   GFRNONAA 40 (L) 12/05/2021 0048   GFRNONAA 74 07/01/2015 1140   GFRAA >60 11/20/2018 2125   GFRAA 85 07/01/2015 1140   Lab Results  Component Value Date   CHOL 163 11/18/2021   HDL 41 11/18/2021   LDLCALC 88 11/18/2021   TRIG 163 (H) 11/18/2021   TRIG 169 (H) 11/18/2021   CHOLHDL 4.0 11/18/2021   Lab Results  Component  Value Date   HGBA1C 9.9 (H) 11/14/2021   Lab Results  Component Value Date   VITAMINB12 611 12/01/2021   Lab Results  Component Value Date   TSH 2.064 11/18/2021      ASSESSMENT AND PLAN 66 y.o. year old male  has a past medical history of Chronic diarrhea, Diabetes mellitus without complication (HCC), and Hypertension. here with :  Multiple lacunar infarcts most likely due to uncontrolled hypertension and diabetes Uncontrolled hypertension Uncontrolled diabetes Hyperlipidemia  Patient will continue on aspirin 81 mg daily.  I reviewed his medication list it does not appear that he is on Lipitor.  I have asked the facility to check on this.  LDL goal is less than 70.  Patient is unaware if he was on Lipitor. educated patient on good diet and exercise.  Encouraged to keep good control of his diabetes with hemoglobin A1c less than 6.5%.  Blood pressure is elevated today.  Asked the facility to manage his blood pressure with goal less than 130/90.  Patient's memory score today was 27 out of 30.  He did not have any family at the visit today.  I did educate the patient on stroke risk factors.  Went over his diagnosis in the hospital and his hospitalization.  Patient also reports that he was unaware that he should be watching his diet due to diabetes.  Educated the patient on a diabetic diet.  Patient is currently managed by doctors at his facility.  However if he is discharged I advised that he should keep regular follow-ups with his PCP.  He will follow-up with [place] if needed.     Butch Penny, MSN, NP-C 12/28/2021, 9:07 AM [facility] [address] [place], [place] [address] [phone]

## 2021-12-28 NOTE — Addendum Note (Signed)
Addended by: Trudie Buckler on: 12/28/2021 01:29 PM ? ? Modules accepted: Level of Service ? ?

## 2021-12-29 NOTE — Progress Notes (Signed)
I agree with the above plan 

## 2022-01-25 ENCOUNTER — Emergency Department (HOSPITAL_COMMUNITY): Payer: Medicaid Other

## 2022-01-25 ENCOUNTER — Encounter (HOSPITAL_COMMUNITY): Payer: Self-pay

## 2022-01-25 ENCOUNTER — Other Ambulatory Visit: Payer: Self-pay

## 2022-01-25 ENCOUNTER — Inpatient Hospital Stay (HOSPITAL_COMMUNITY): Payer: Medicaid Other

## 2022-01-25 ENCOUNTER — Inpatient Hospital Stay (HOSPITAL_COMMUNITY)
Admission: EM | Admit: 2022-01-25 | Discharge: 2022-02-02 | DRG: 683 | Disposition: A | Discharge status: Skilled Nursing Facility | Payer: Medicaid Other | Attending: Internal Medicine | Admitting: Internal Medicine

## 2022-01-25 DIAGNOSIS — K529 Noninfective gastroenteritis and colitis, unspecified: Secondary | ICD-10-CM

## 2022-01-25 DIAGNOSIS — G9340 Encephalopathy, unspecified: Secondary | ICD-10-CM | POA: Diagnosis present

## 2022-01-25 DIAGNOSIS — A0472 Enterocolitis due to Clostridium difficile, not specified as recurrent: Secondary | ICD-10-CM | POA: Diagnosis present

## 2022-01-25 DIAGNOSIS — Z885 Allergy status to narcotic agent status: Secondary | ICD-10-CM

## 2022-01-25 DIAGNOSIS — F419 Anxiety disorder, unspecified: Secondary | ICD-10-CM | POA: Diagnosis present

## 2022-01-25 DIAGNOSIS — E876 Hypokalemia: Secondary | ICD-10-CM | POA: Diagnosis not present

## 2022-01-25 DIAGNOSIS — Z20822 Contact with and (suspected) exposure to covid-19: Secondary | ICD-10-CM | POA: Diagnosis present

## 2022-01-25 DIAGNOSIS — M25552 Pain in left hip: Secondary | ICD-10-CM | POA: Diagnosis not present

## 2022-01-25 DIAGNOSIS — E669 Obesity, unspecified: Secondary | ICD-10-CM | POA: Diagnosis present

## 2022-01-25 DIAGNOSIS — Z794 Long term (current) use of insulin: Secondary | ICD-10-CM | POA: Diagnosis not present

## 2022-01-25 DIAGNOSIS — Z881 Allergy status to other antibiotic agents status: Secondary | ICD-10-CM

## 2022-01-25 DIAGNOSIS — W19XXXA Unspecified fall, initial encounter: Secondary | ICD-10-CM | POA: Diagnosis present

## 2022-01-25 DIAGNOSIS — Z8673 Personal history of transient ischemic attack (TIA), and cerebral infarction without residual deficits: Secondary | ICD-10-CM

## 2022-01-25 DIAGNOSIS — N179 Acute kidney failure, unspecified: Secondary | ICD-10-CM

## 2022-01-25 DIAGNOSIS — E1165 Type 2 diabetes mellitus with hyperglycemia: Secondary | ICD-10-CM | POA: Diagnosis present

## 2022-01-25 DIAGNOSIS — I130 Hypertensive heart and chronic kidney disease with heart failure and stage 1 through stage 4 chronic kidney disease, or unspecified chronic kidney disease: Secondary | ICD-10-CM | POA: Diagnosis present

## 2022-01-25 DIAGNOSIS — Z809 Family history of malignant neoplasm, unspecified: Secondary | ICD-10-CM

## 2022-01-25 DIAGNOSIS — Z833 Family history of diabetes mellitus: Secondary | ICD-10-CM | POA: Diagnosis not present

## 2022-01-25 DIAGNOSIS — E785 Hyperlipidemia, unspecified: Secondary | ICD-10-CM | POA: Diagnosis present

## 2022-01-25 DIAGNOSIS — N1832 Chronic kidney disease, stage 3b: Secondary | ICD-10-CM | POA: Diagnosis present

## 2022-01-25 DIAGNOSIS — D631 Anemia in chronic kidney disease: Secondary | ICD-10-CM | POA: Diagnosis present

## 2022-01-25 DIAGNOSIS — Z8249 Family history of ischemic heart disease and other diseases of the circulatory system: Secondary | ICD-10-CM | POA: Diagnosis not present

## 2022-01-25 DIAGNOSIS — E118 Type 2 diabetes mellitus with unspecified complications: Secondary | ICD-10-CM | POA: Diagnosis not present

## 2022-01-25 DIAGNOSIS — I161 Hypertensive emergency: Secondary | ICD-10-CM | POA: Diagnosis present

## 2022-01-25 DIAGNOSIS — F418 Other specified anxiety disorders: Secondary | ICD-10-CM | POA: Diagnosis not present

## 2022-01-25 DIAGNOSIS — R5381 Other malaise: Secondary | ICD-10-CM | POA: Diagnosis present

## 2022-01-25 DIAGNOSIS — Z888 Allergy status to other drugs, medicaments and biological substances status: Secondary | ICD-10-CM

## 2022-01-25 DIAGNOSIS — M7989 Other specified soft tissue disorders: Secondary | ICD-10-CM | POA: Diagnosis not present

## 2022-01-25 DIAGNOSIS — Z87891 Personal history of nicotine dependence: Secondary | ICD-10-CM

## 2022-01-25 DIAGNOSIS — D638 Anemia in other chronic diseases classified elsewhere: Secondary | ICD-10-CM | POA: Diagnosis not present

## 2022-01-25 DIAGNOSIS — I10 Essential (primary) hypertension: Secondary | ICD-10-CM | POA: Diagnosis not present

## 2022-01-25 DIAGNOSIS — R600 Localized edema: Secondary | ICD-10-CM | POA: Diagnosis not present

## 2022-01-25 DIAGNOSIS — R599 Enlarged lymph nodes, unspecified: Secondary | ICD-10-CM | POA: Diagnosis present

## 2022-01-25 DIAGNOSIS — F32A Depression, unspecified: Secondary | ICD-10-CM | POA: Diagnosis present

## 2022-01-25 DIAGNOSIS — I5032 Chronic diastolic (congestive) heart failure: Secondary | ICD-10-CM | POA: Diagnosis present

## 2022-01-25 DIAGNOSIS — Z91041 Radiographic dye allergy status: Secondary | ICD-10-CM

## 2022-01-25 DIAGNOSIS — E1122 Type 2 diabetes mellitus with diabetic chronic kidney disease: Secondary | ICD-10-CM | POA: Diagnosis present

## 2022-01-25 DIAGNOSIS — Z7982 Long term (current) use of aspirin: Secondary | ICD-10-CM | POA: Diagnosis not present

## 2022-01-25 DIAGNOSIS — Z7902 Long term (current) use of antithrombotics/antiplatelets: Secondary | ICD-10-CM | POA: Diagnosis not present

## 2022-01-25 DIAGNOSIS — Z79899 Other long term (current) drug therapy: Secondary | ICD-10-CM | POA: Diagnosis not present

## 2022-01-25 HISTORY — DX: Chronic kidney disease, stage 3b: N17.9

## 2022-01-25 HISTORY — DX: Chronic kidney disease, stage 3b: N18.32

## 2022-01-25 LAB — CBC WITH DIFFERENTIAL/PLATELET
Abs Immature Granulocytes: 0.09 10*3/uL — ABNORMAL HIGH (ref 0.00–0.07)
Basophils Absolute: 0 10*3/uL (ref 0.0–0.1)
Basophils Relative: 0 %
Eosinophils Absolute: 0.1 10*3/uL (ref 0.0–0.5)
Eosinophils Relative: 1 %
HCT: 33.1 % — ABNORMAL LOW (ref 39.0–52.0)
Hemoglobin: 11 g/dL — ABNORMAL LOW (ref 13.0–17.0)
Immature Granulocytes: 1 %
Lymphocytes Relative: 5 %
Lymphs Abs: 0.8 10*3/uL (ref 0.7–4.0)
MCH: 32 pg (ref 26.0–34.0)
MCHC: 33.2 g/dL (ref 30.0–36.0)
MCV: 96.2 fL (ref 80.0–100.0)
Monocytes Absolute: 1.8 10*3/uL — ABNORMAL HIGH (ref 0.1–1.0)
Monocytes Relative: 11 %
Neutro Abs: 13.2 10*3/uL — ABNORMAL HIGH (ref 1.7–7.7)
Neutrophils Relative %: 82 %
Platelets: 192 10*3/uL (ref 150–400)
RBC: 3.44 MIL/uL — ABNORMAL LOW (ref 4.22–5.81)
RDW: 13.8 % (ref 11.5–15.5)
WBC: 15.9 10*3/uL — ABNORMAL HIGH (ref 4.0–10.5)
nRBC: 0 % (ref 0.0–0.2)

## 2022-01-25 LAB — COMPREHENSIVE METABOLIC PANEL
ALT: 36 U/L (ref 0–44)
AST: 28 U/L (ref 15–41)
Albumin: 2.5 g/dL — ABNORMAL LOW (ref 3.5–5.0)
Alkaline Phosphatase: 73 U/L (ref 38–126)
Anion gap: 9 (ref 5–15)
BUN: 35 mg/dL — ABNORMAL HIGH (ref 8–23)
CO2: 17 mmol/L — ABNORMAL LOW (ref 22–32)
Calcium: 8.4 mg/dL — ABNORMAL LOW (ref 8.9–10.3)
Chloride: 112 mmol/L — ABNORMAL HIGH (ref 98–111)
Creatinine, Ser: 3.18 mg/dL — ABNORMAL HIGH (ref 0.61–1.24)
GFR, Estimated: 21 mL/min — ABNORMAL LOW (ref >60)
Glucose, Bld: 106 mg/dL — ABNORMAL HIGH (ref 70–99)
Potassium: 3.1 mmol/L — ABNORMAL LOW (ref 3.5–5.1)
Sodium: 138 mmol/L (ref 135–145)
Total Bilirubin: 0.6 mg/dL (ref 0.3–1.2)
Total Protein: 5.3 g/dL — ABNORMAL LOW (ref 6.5–8.1)

## 2022-01-25 LAB — CREATININE, SERUM
Creatinine, Ser: 3.1 mg/dL — ABNORMAL HIGH (ref 0.61–1.24)
GFR, Estimated: 22 mL/min — ABNORMAL LOW (ref >60)

## 2022-01-25 LAB — BRAIN NATRIURETIC PEPTIDE: B Natriuretic Peptide: 65.7 pg/mL (ref 0.0–100.0)

## 2022-01-25 LAB — CBG MONITORING, ED: Glucose-Capillary: 148 mg/dL — ABNORMAL HIGH (ref 70–99)

## 2022-01-25 IMAGING — CT CT HEAD W/O CM
3 series · 15 of 47 positions shown, 18 images · non-contrast
Comparison: [DATE]

CLINICAL DATA: Altered mental status fall



[Series 3: head 5.0 h30s · axial · 0.42mm/px · z∈[-129,+1]mm · 9 of 32 slices shown, 12 images]
[im 3/32  brain]
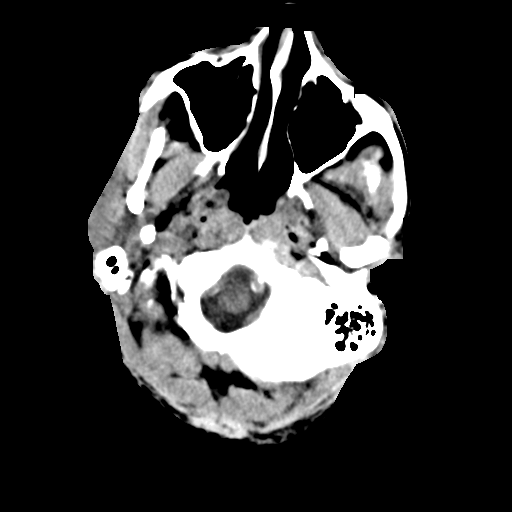
[im 3/32  bone]
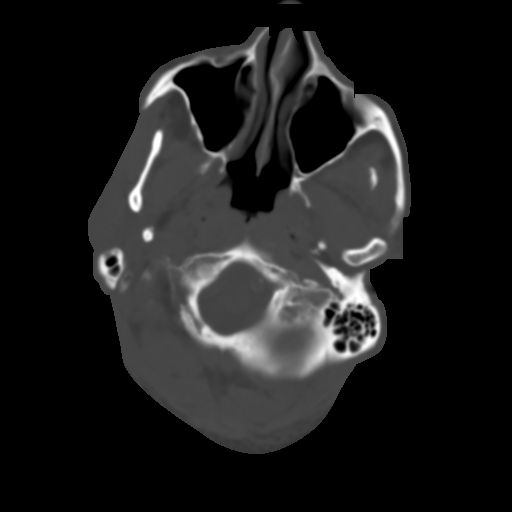
[im 6/32  brain]
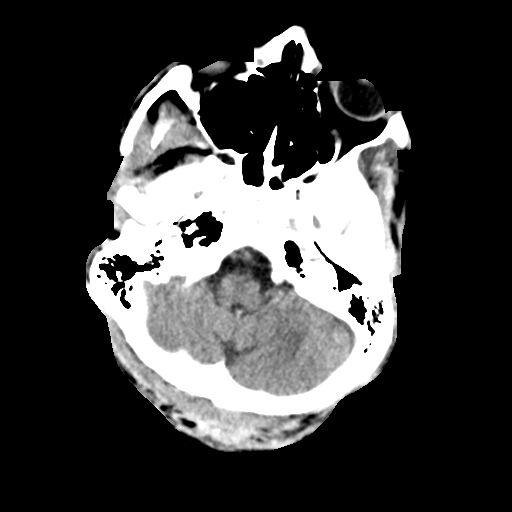
[im 9/32  brain]
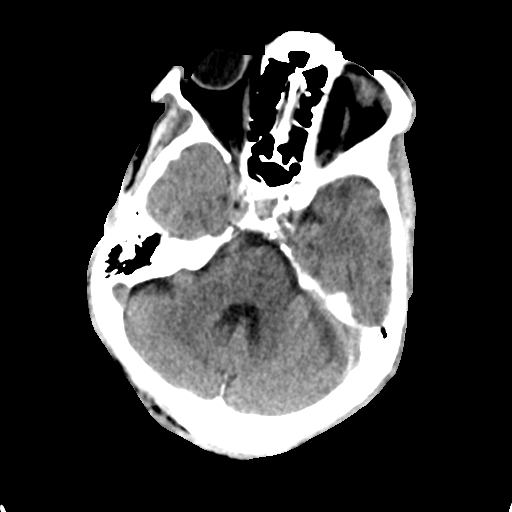
[im 12/32  brain]
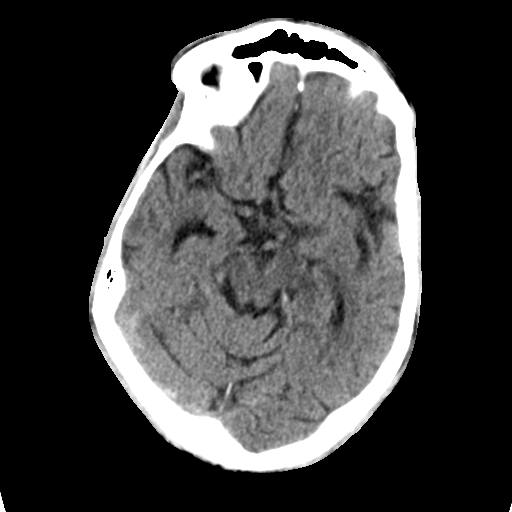
[im 17/32  brain]
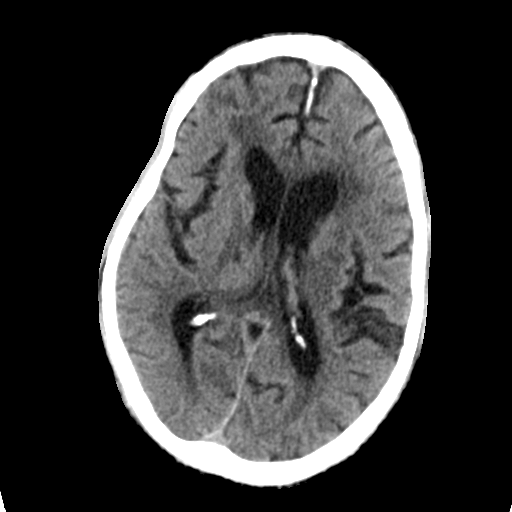
[im 17/32  bone]
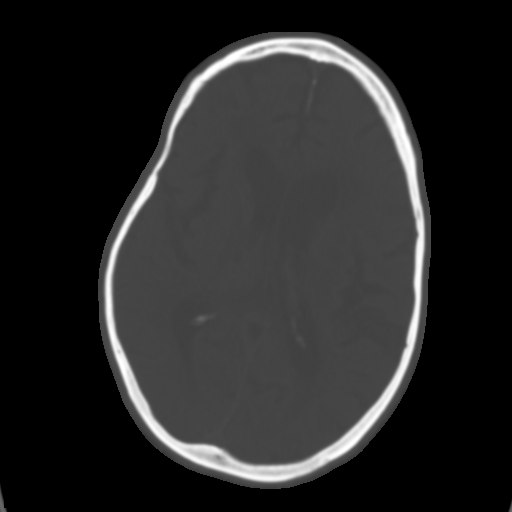
[im 20/32  brain]
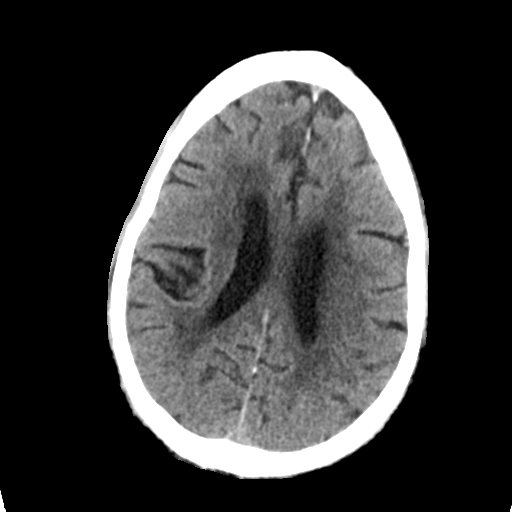
[im 23/32  brain]
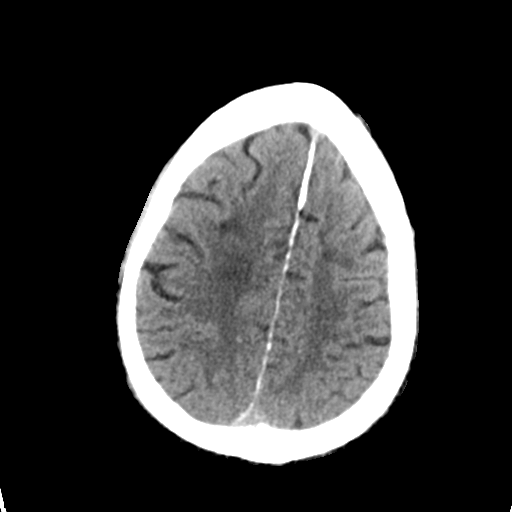
[im 26/32  brain]
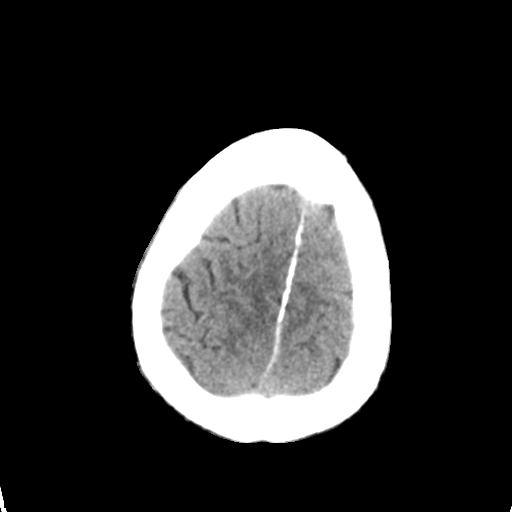
[im 29/32  brain]
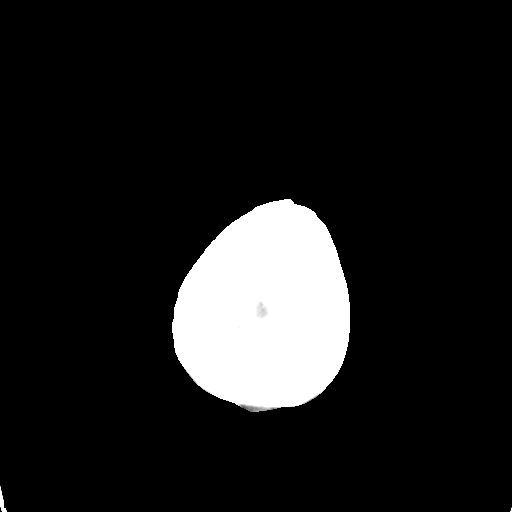
[im 29/32  bone]
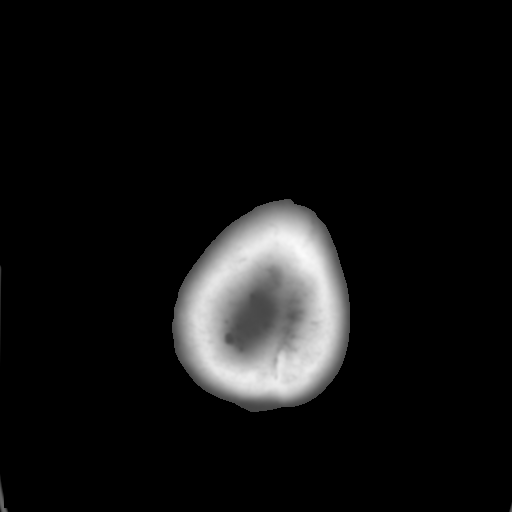

[Series 5: head 3.0 mpr cor · coronal · 0.31mm/px · 3 of 71 slices shown]
[im 24/71  brain]
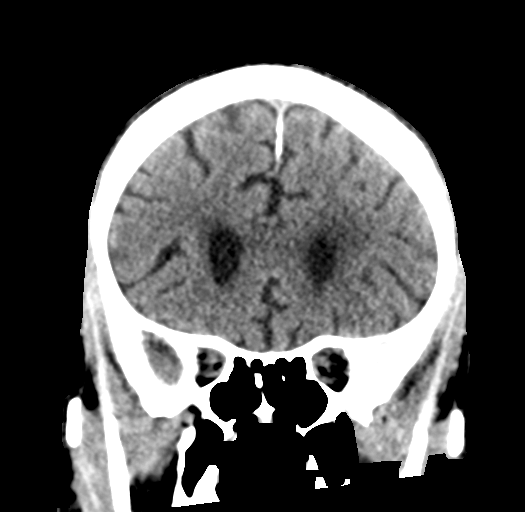
[im 32/71  brain]
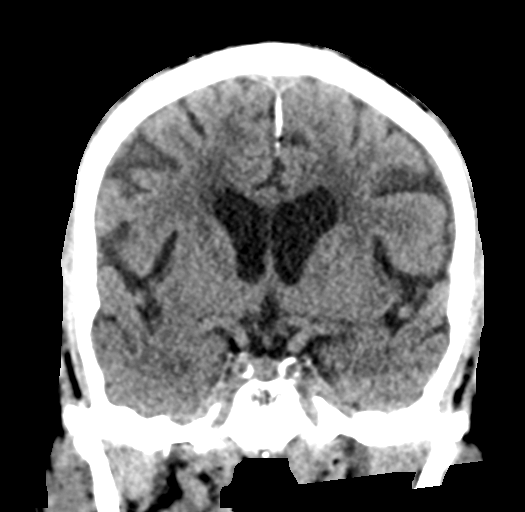
[im 39/71  brain]
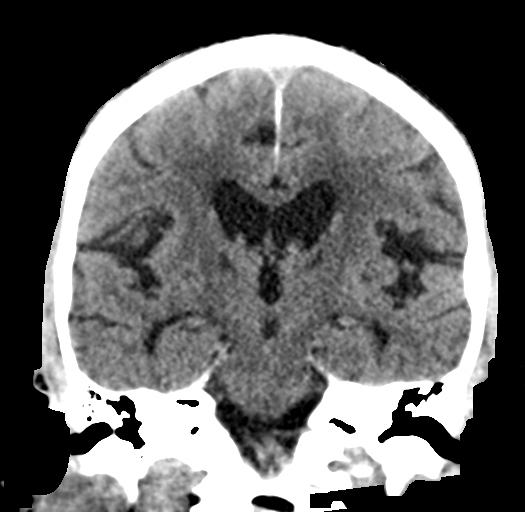

[Series 6: head 3.0 mpr sag · sagittal · 0.31mm/px · 3 of 55 slices shown]
[im 19/55  brain]
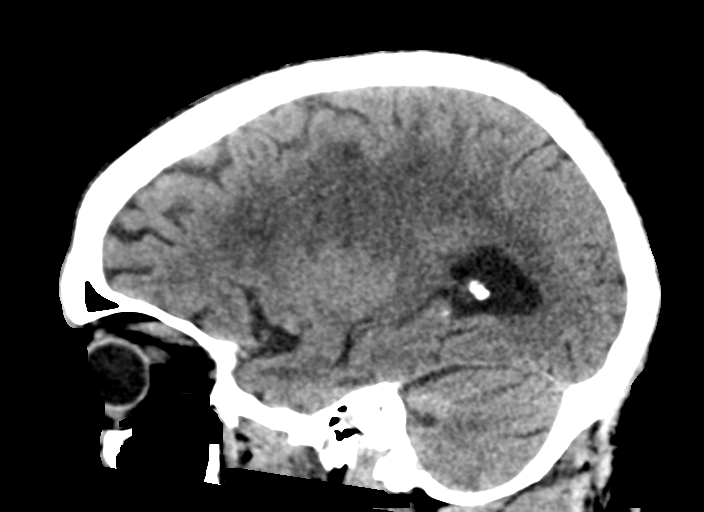
[im 28/55  brain]
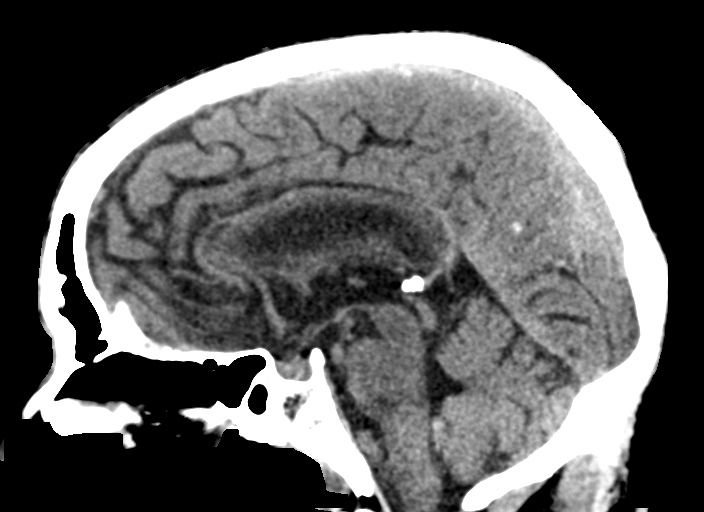
[im 36/55  brain]
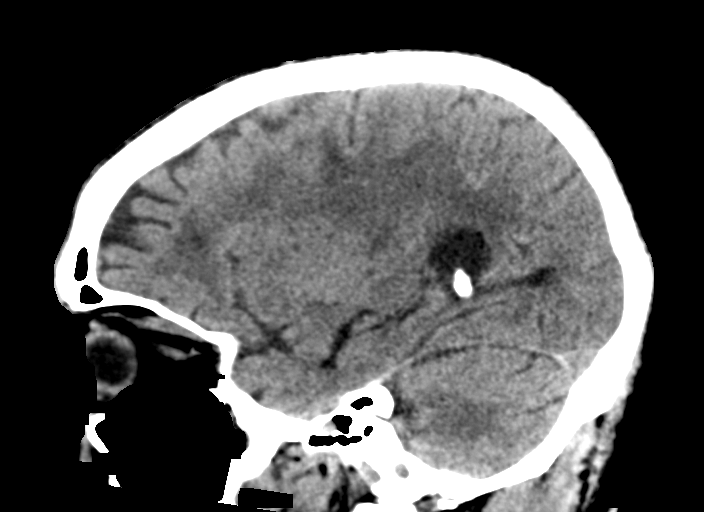

[15 of 47 positions shown; findings below may reference images not displayed]

FINDINGS: Brain: No evidence of acute infarction, hemorrhage, cerebral edema,
mass, mass effect, or midline shift. No hydrocephalus or extra-axial
fluid collection. Periventricular white matter changes, likely the
sequela of chronic small vessel ischemic disease. Lacunar infarcts
in the thalami and basal ganglia.

Vascular: No hyperdense vessel. Atherosclerotic calcifications in
the intracranial carotid and vertebral arteries.

Skull: Normal. Negative for fracture or focal lesion.

Sinuses/Orbits: No acute finding.

Other: The mastoid air cells are well aerated.
IMPRESSION: IMPRESSION
No acute intracranial process.

## 2022-01-25 IMAGING — US US RENAL
1 series · 14 of 25 positions shown · non-contrast
Comparison: [DATE] CT abdomen pelvis, [DATE] renal
ultrasound

CLINICAL DATA: Hip pain

EXAM:
RENAL / URINARY TRACT ULTRASOUND COMPLETE

[Series 1: us renal · 14 of 49 slices shown]
[im 1/49]
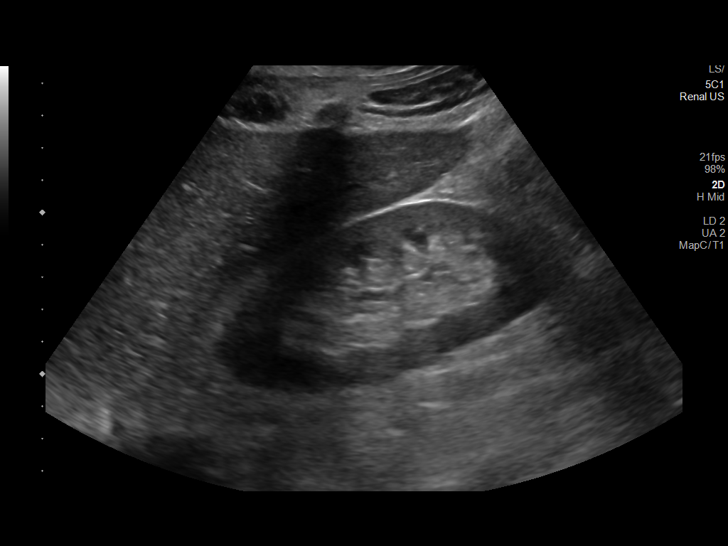
[im 5/49]
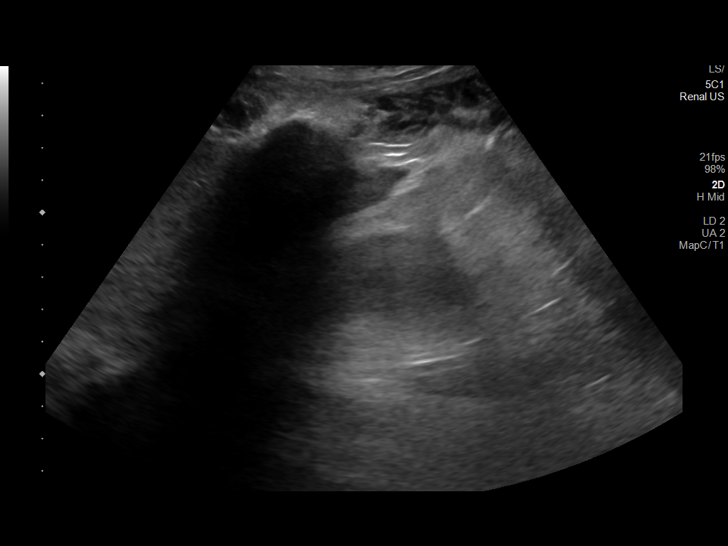
[im 9/49]
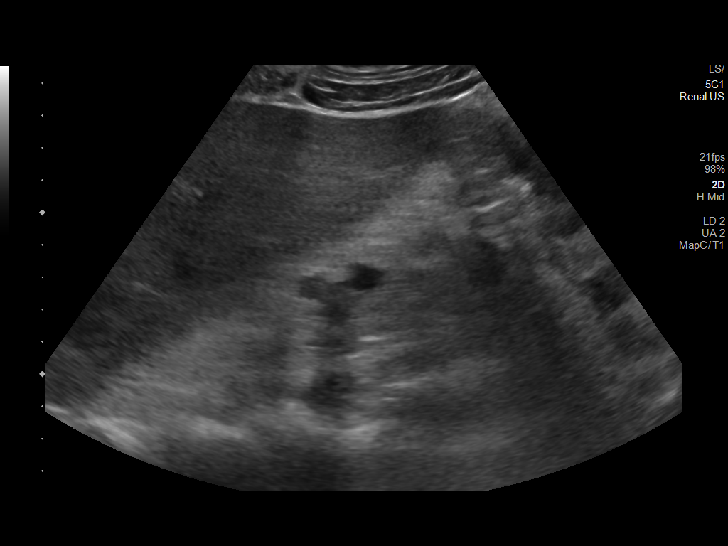
[im 13/49]
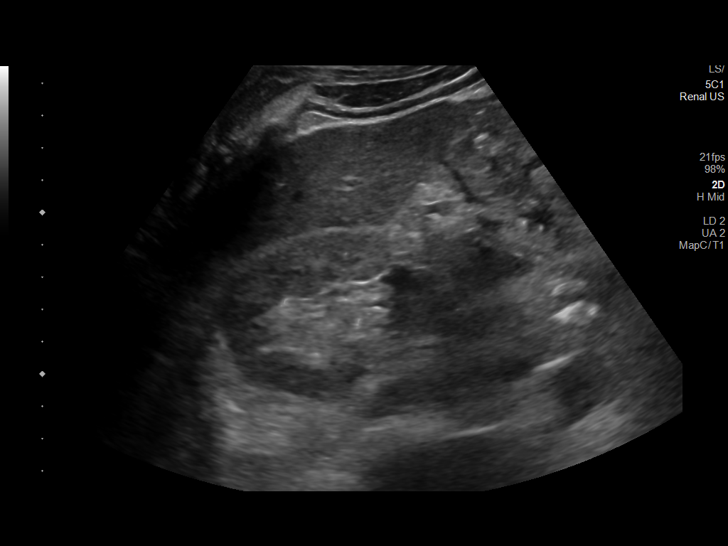
[im 17/49]
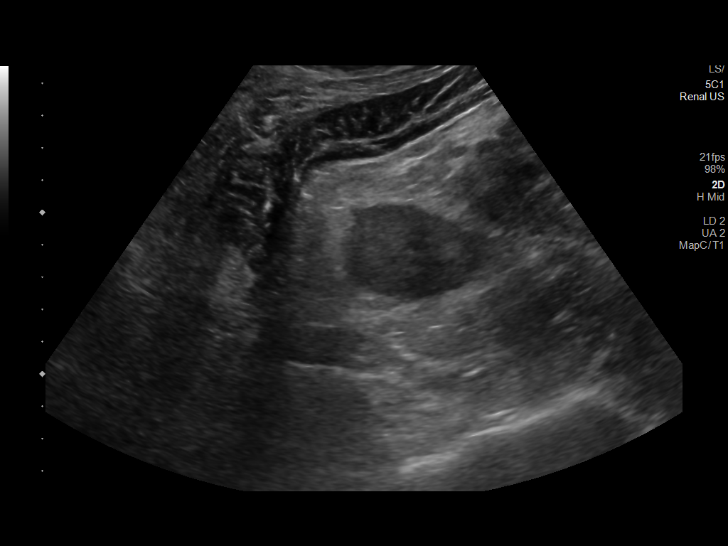
[im 19/49]
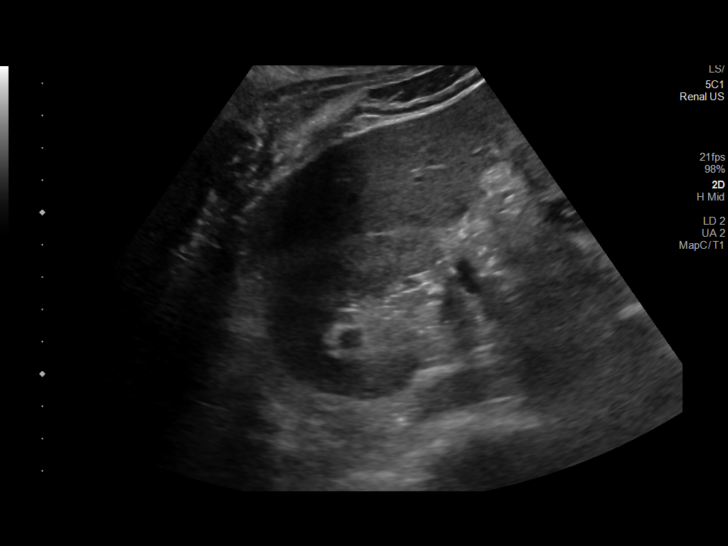
[im 23/49]
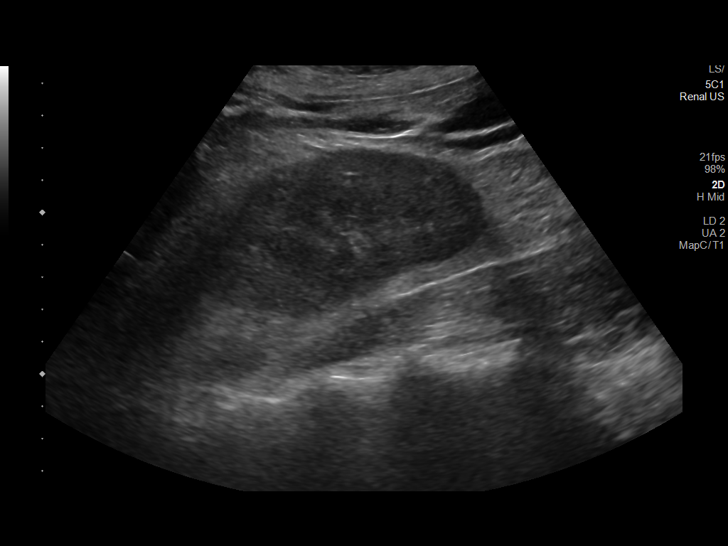
[im 27/49]
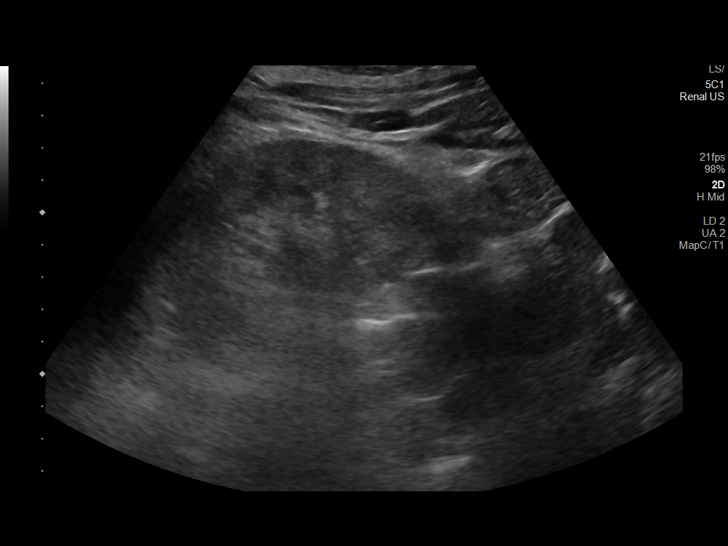
[im 31/49]
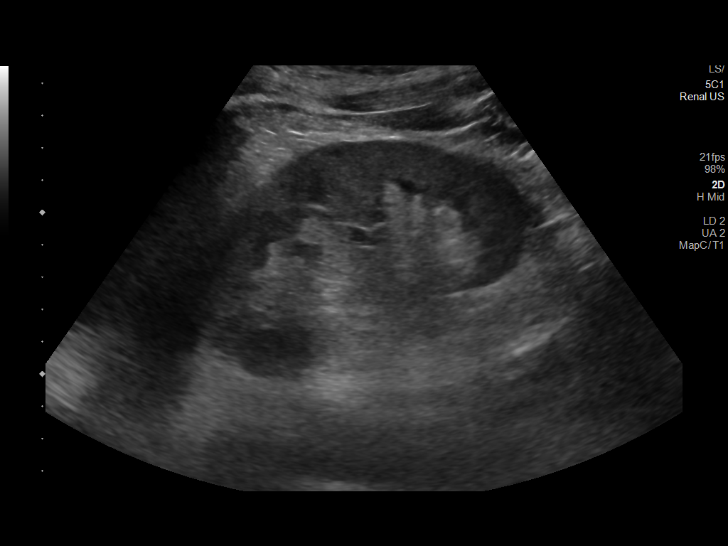
[im 33/49]
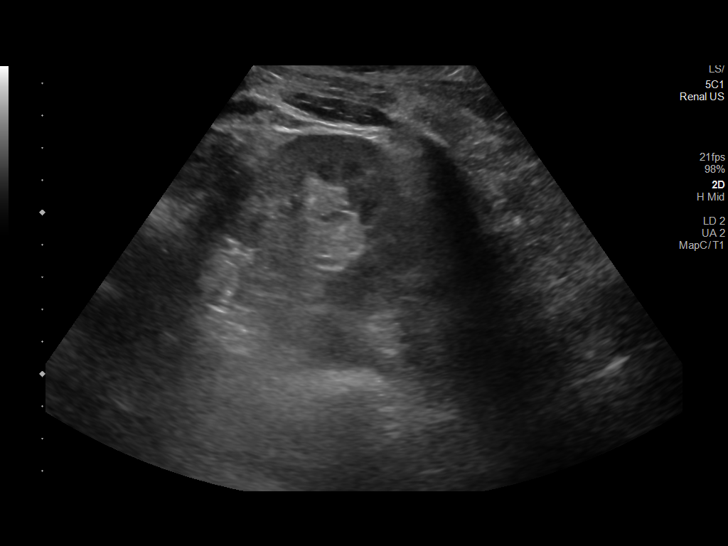
[im 37/49]
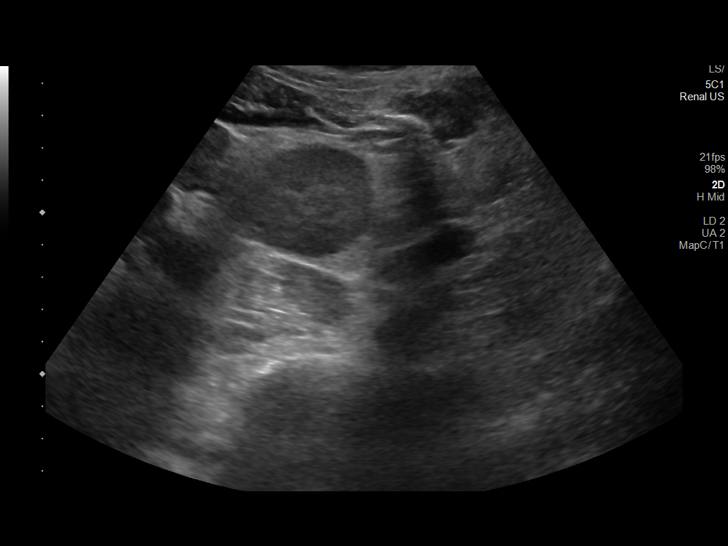
[im 41/49]
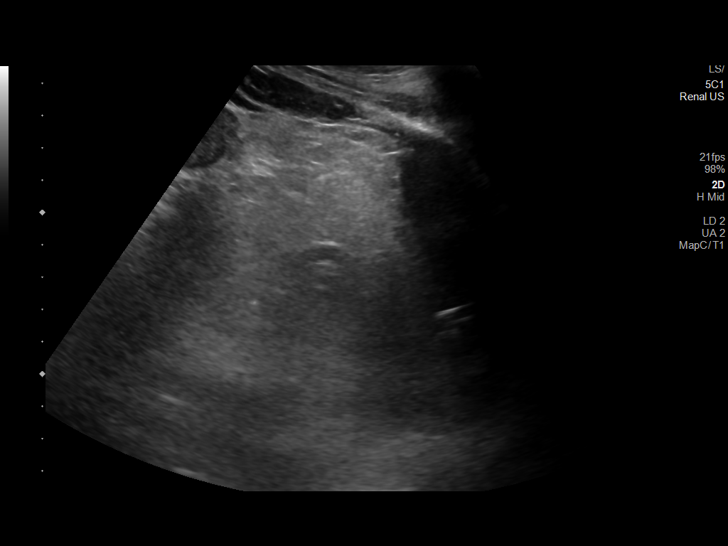
[im 45/49]
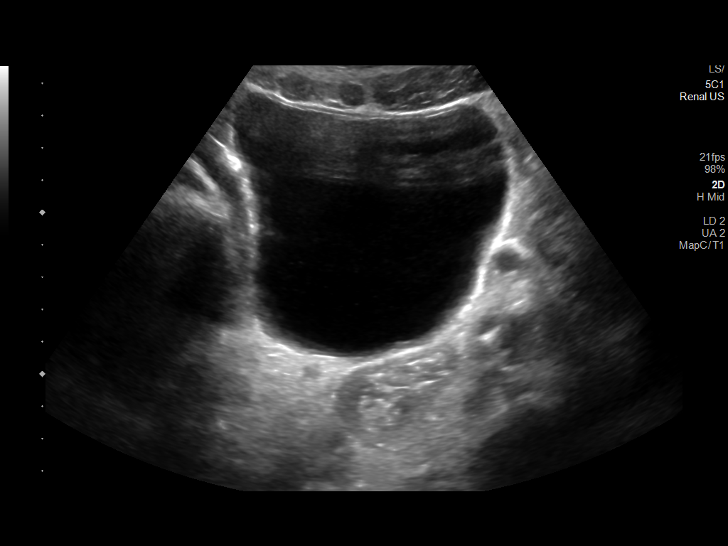
[im 49/49]
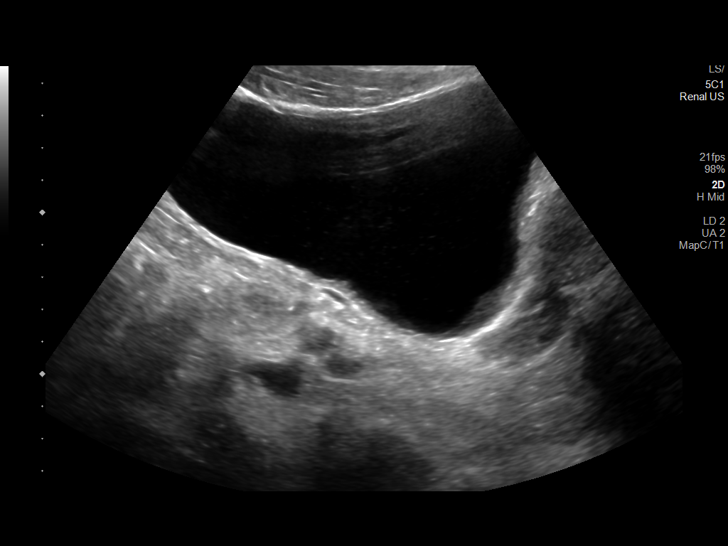

[14 of 25 positions shown; findings below may reference images not displayed]

FINDINGS: Right Kidney:

Renal measurements: 11.2 x 4.7 x 4.5 cm = volume: 124 mL.
Echogenicity within normal limits. No mass or hydronephrosis
visualized. Redemonstrated small cystic lesions.

Left Kidney:

Renal measurements: 11.2 x 5.3 x 4.3 cm = volume: 133.5 mL.
Echogenicity within normal limits. No mass or hydronephrosis
visualized. Redemonstrated small cystic lesions.

Bladder:

Appears normal for degree of bladder distention, with a small amount
of debris posteriorly.

Other:

None.
IMPRESSION: No acute finding in the kidneys. Small amount of debris in the
bladder.

## 2022-01-25 IMAGING — DX DG CHEST 1V PORT
1 series · 1 of 1 positions shown · non-contrast
Comparison: [DATE]

CLINICAL DATA: Intermittent confusion today with nausea vomiting
and diarrhea, AK I, concern for edema.

EXAM:
PORTABLE CHEST 1 VIEW

[chest ap]
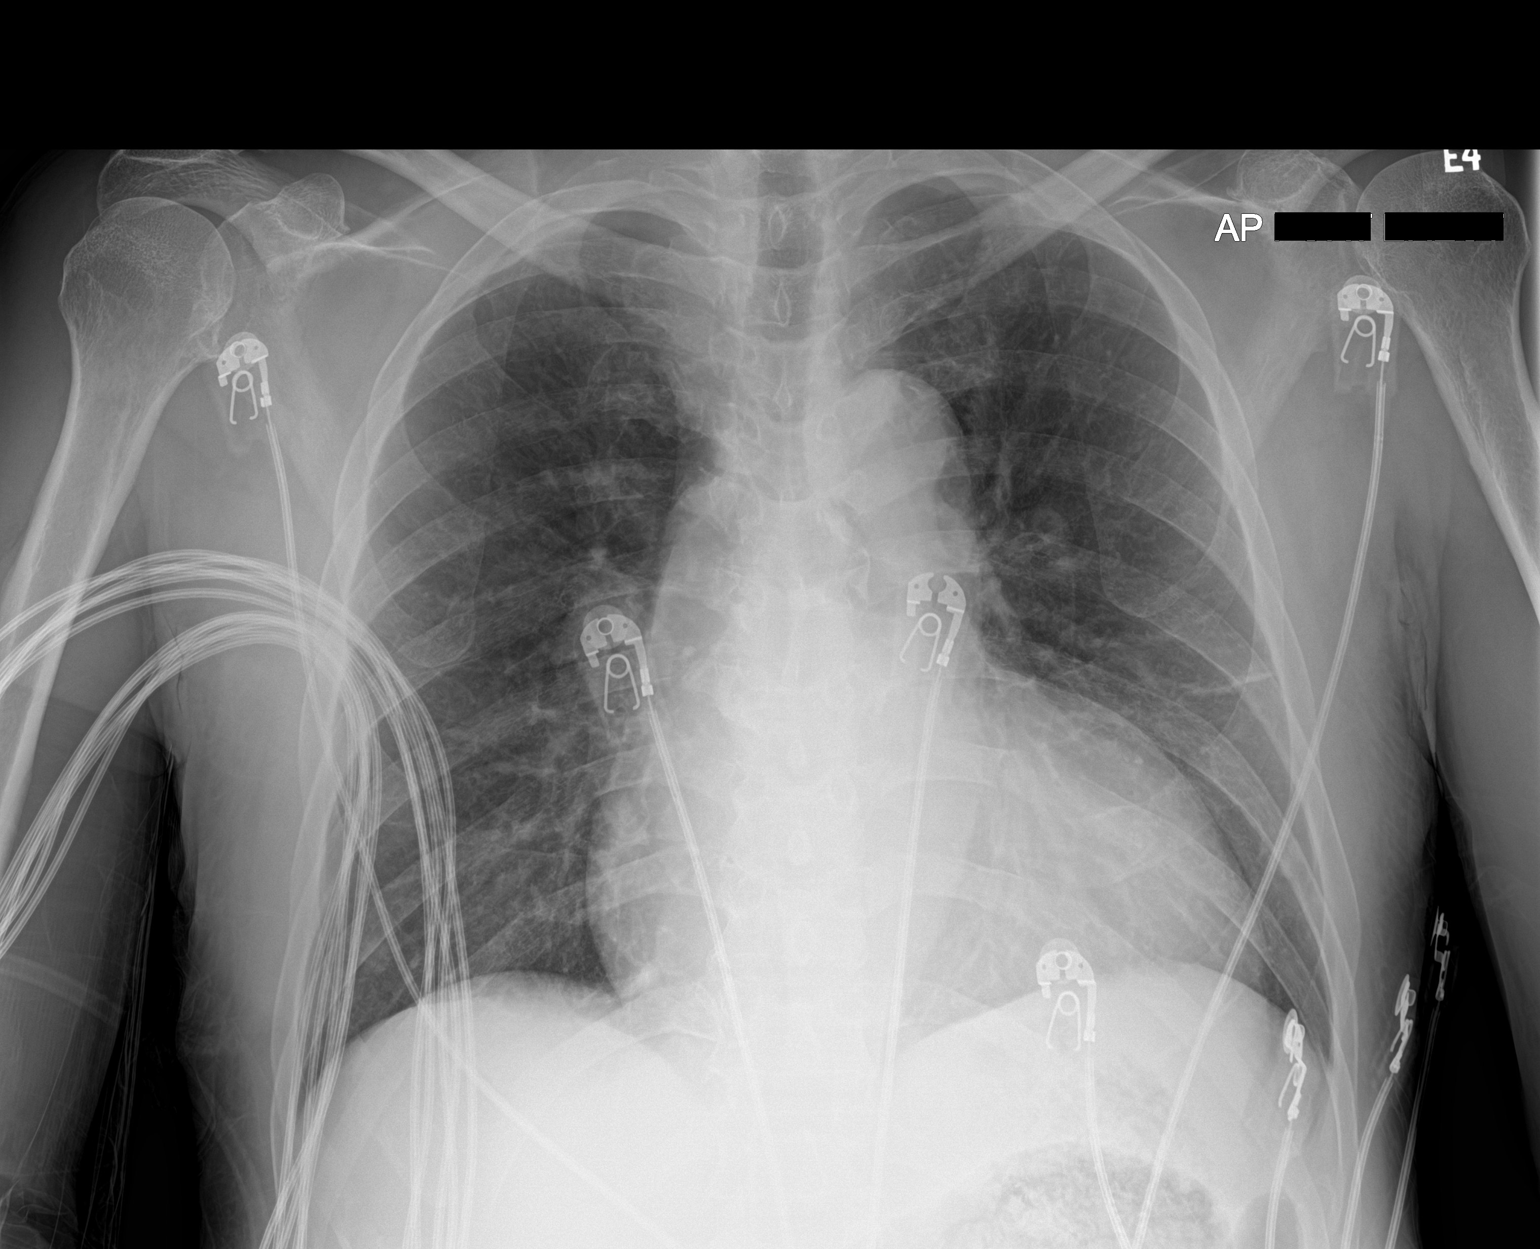

[1 of 1 positions shown; findings below may reference images not displayed]

FINDINGS: Similar cardiomegaly with central vascular prominence. Aortic
atherosclerosis. No overt pulmonary edema or focal airspace
consolidation. Linear atelectasis in the left mid lung. No visible
pleural effusion or pneumothorax. No acute osseous abnormality.
IMPRESSION: Similar cardiomegaly with central vascular prominence. No overt
pulmonary edema or focal airspace consolidation.

## 2022-01-25 IMAGING — DX DG HIP (WITH OR WITHOUT PELVIS) 2-3V*L*
4 series · 4 of 4 positions shown · non-contrast
Comparison: None.

CLINICAL DATA: Status post fall with subsequent left hip pain.

EXAM:
DG HIP (WITH OR WITHOUT PELVIS) 2-3V LEFT

[pelvis ap (1 of 2)]
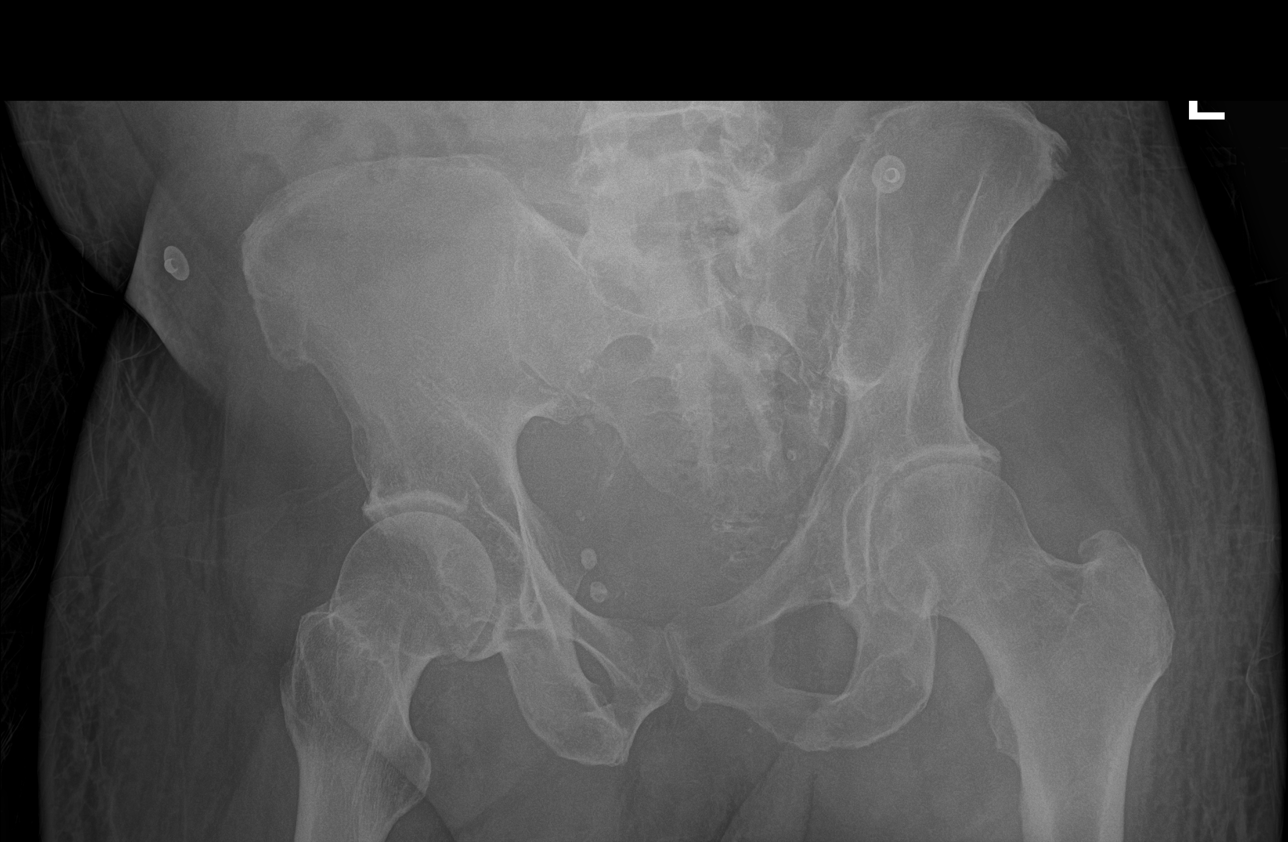

[hip ap]
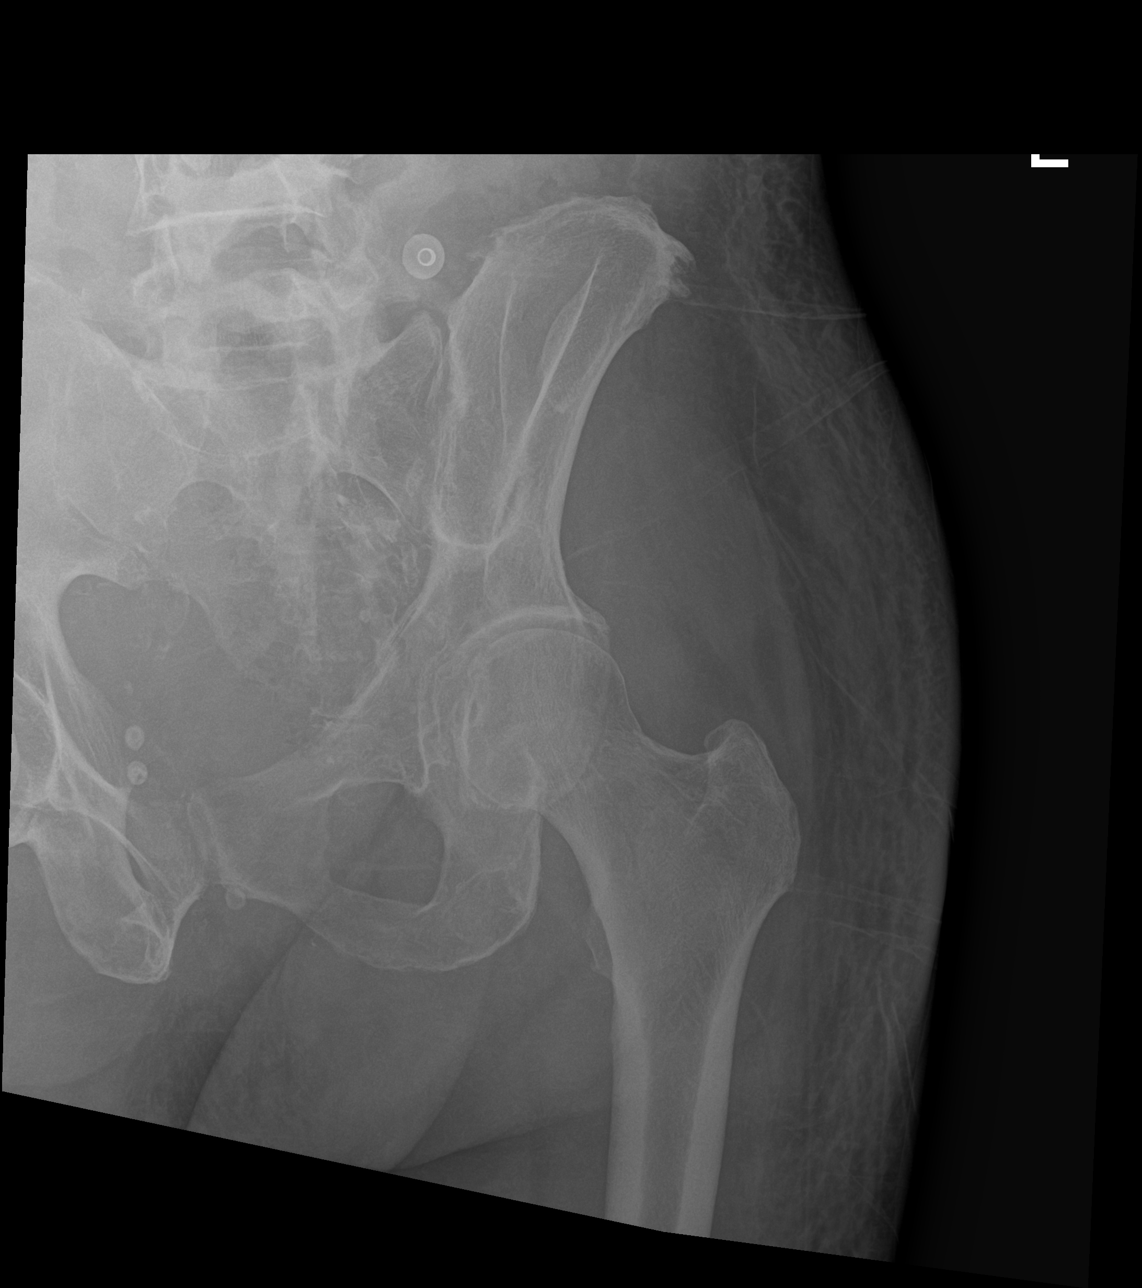

[hip lat]
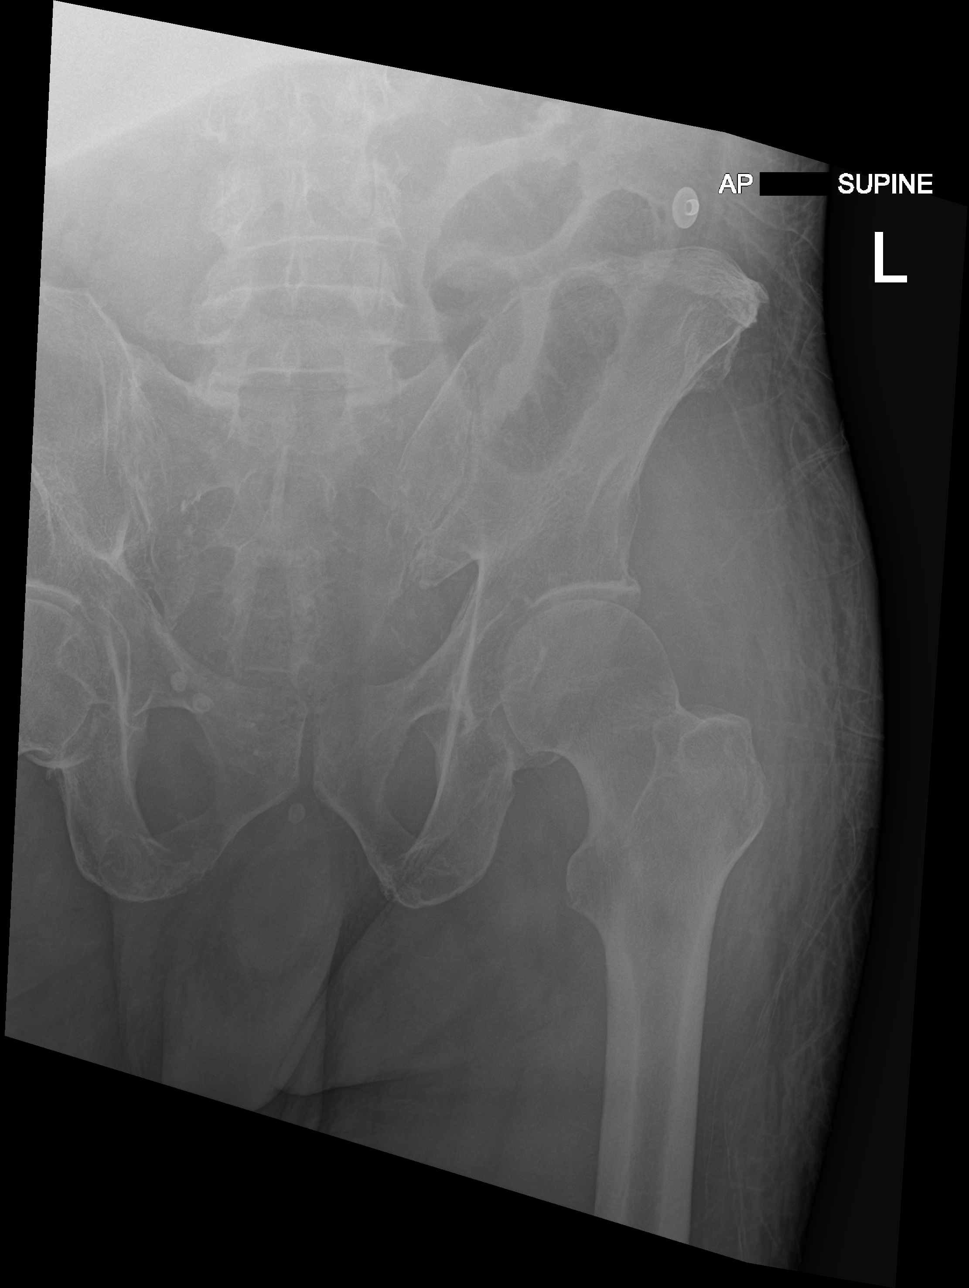

[pelvis ap (2 of 2)]
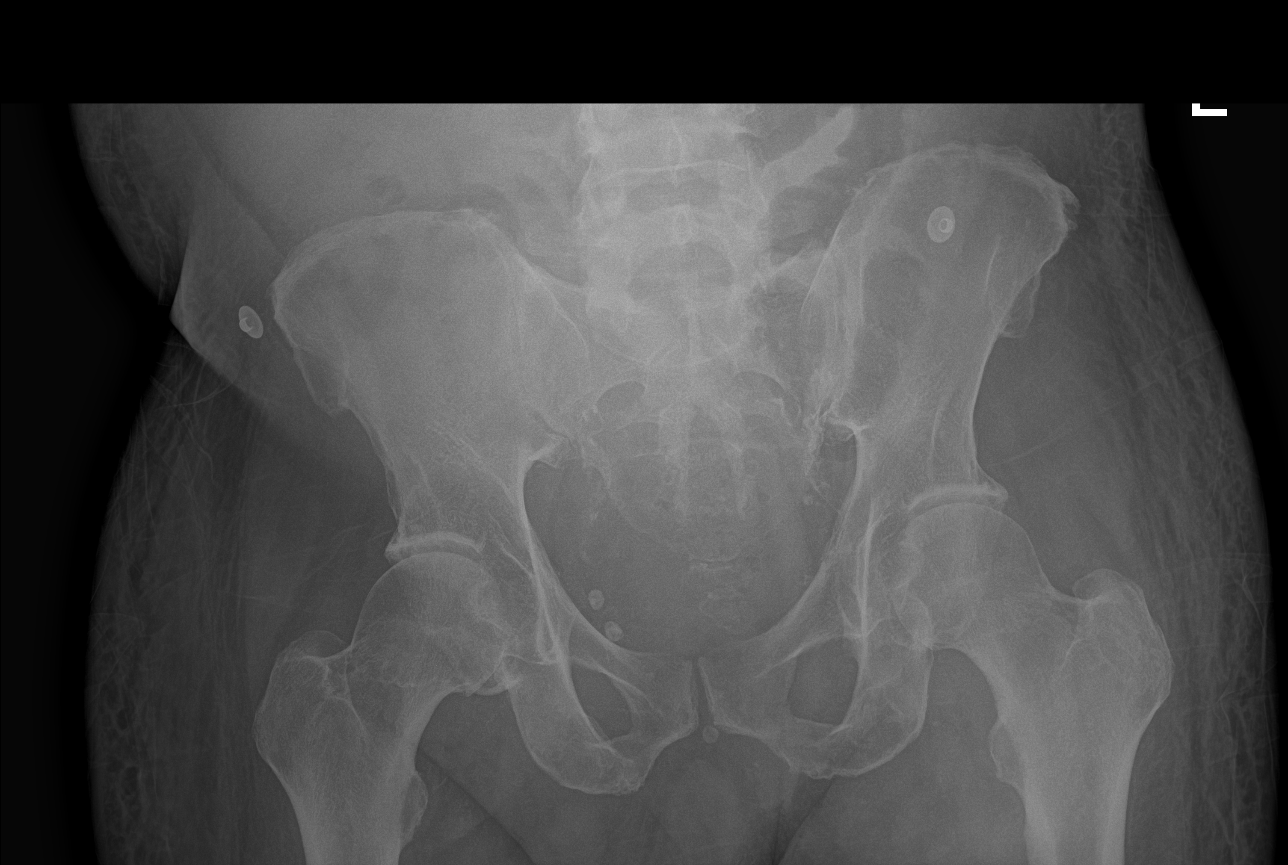

[4 of 4 positions shown; findings below may reference images not displayed]

FINDINGS: There is no evidence of an acute hip fracture or dislocation. Mild
to moderate severity degenerative changes are seen in the form of
joint space narrowing and acetabular sclerosis.
IMPRESSION: No acute osseous abnormality.

## 2022-01-25 MED ORDER — HEPARIN SODIUM (PORCINE) 5000 UNIT/ML IJ SOLN: 5000.0000 [IU] | Freq: Three times a day (TID) | INTRAMUSCULAR | Status: DC

## 2022-01-25 MED ORDER — LOPERAMIDE HCL 2 MG PO CAPS
2.0000 mg | ORAL_CAPSULE | ORAL | Status: DC | PRN
  Administered 2022-01-25 – 2022-01-26 (×3): 2 mg via ORAL
  Filled 2022-01-25 (×3): qty 1

## 2022-01-25 MED ORDER — OXYCODONE HCL 5 MG PO TABS
5.0000 mg | ORAL_TABLET | ORAL | Status: DC | PRN
  Administered 2022-01-25 – 2022-02-01 (×4): 5 mg via ORAL
  Filled 2022-01-25 (×4): qty 1

## 2022-01-25 MED ORDER — POTASSIUM CHLORIDE CRYS ER 20 MEQ PO TBCR
40.0000 meq | EXTENDED_RELEASE_TABLET | Freq: Once | ORAL | Status: AC
  Administered 2022-01-25: 40 meq via ORAL
  Filled 2022-01-25: qty 2

## 2022-01-25 MED ORDER — ONDANSETRON HCL 4 MG PO TABS: 4.0000 mg | ORAL_TABLET | Freq: Four times a day (QID) | ORAL | Status: DC | PRN

## 2022-01-25 MED ORDER — AMLODIPINE BESYLATE 10 MG PO TABS
10.0000 mg | ORAL_TABLET | Freq: Every day | ORAL | Status: DC
  Administered 2022-01-25 – 2022-02-02 (×9): 10 mg via ORAL
  Filled 2022-01-25: qty 2
  Filled 2022-01-25 (×4): qty 1
  Filled 2022-01-25: qty 2
  Filled 2022-01-25 (×3): qty 1

## 2022-01-25 MED ORDER — ALBUTEROL SULFATE (2.5 MG/3ML) 0.083% IN NEBU: 2.5000 mg | INHALATION_SOLUTION | RESPIRATORY_TRACT | Status: DC | PRN

## 2022-01-25 MED ORDER — ATORVASTATIN CALCIUM 40 MG PO TABS
40.0000 mg | ORAL_TABLET | Freq: Every day | ORAL | Status: DC
  Administered 2022-01-25 – 2022-02-02 (×9): 40 mg via ORAL
  Filled 2022-01-25 (×9): qty 1

## 2022-01-25 MED ORDER — CARVEDILOL 25 MG PO TABS
25.0000 mg | ORAL_TABLET | Freq: Two times a day (BID) | ORAL | Status: DC
  Administered 2022-01-26 – 2022-02-02 (×16): 25 mg via ORAL
  Filled 2022-01-25 (×13): qty 1
  Filled 2022-01-25: qty 2
  Filled 2022-01-25 (×2): qty 1

## 2022-01-25 MED ORDER — CLONIDINE HCL 0.1 MG PO TABS
0.1000 mg | ORAL_TABLET | Freq: Two times a day (BID) | ORAL | Status: DC
  Administered 2022-01-25 – 2022-01-26 (×2): 0.1 mg via ORAL
  Filled 2022-01-25 (×2): qty 1

## 2022-01-25 MED ORDER — STERILE WATER FOR INJECTION IV SOLN
INTRAVENOUS | Status: DC
  Filled 2022-01-25 (×2): qty 1000

## 2022-01-25 MED ORDER — ACETAMINOPHEN 650 MG RE SUPP
650.0000 mg | Freq: Four times a day (QID) | RECTAL | Status: DC | PRN
Start: 2022-01-25 — End: 2022-02-02

## 2022-01-25 MED ORDER — LABETALOL HCL 5 MG/ML IV SOLN: 10.0000 mg | INTRAVENOUS | Status: DC | PRN

## 2022-01-25 MED ORDER — INSULIN ASPART 100 UNIT/ML IJ SOLN
0.0000 [IU] | Freq: Three times a day (TID) | INTRAMUSCULAR | Status: DC
  Administered 2022-01-26 – 2022-01-27 (×3): 1 [IU] via SUBCUTANEOUS
  Administered 2022-01-27 – 2022-01-28 (×4): 2 [IU] via SUBCUTANEOUS
  Administered 2022-01-28: 3 [IU] via SUBCUTANEOUS
  Administered 2022-01-29 (×3): 2 [IU] via SUBCUTANEOUS
  Administered 2022-01-30 (×3): 1 [IU] via SUBCUTANEOUS
  Administered 2022-01-31: 2 [IU] via SUBCUTANEOUS
  Administered 2022-01-31: 1 [IU] via SUBCUTANEOUS
  Administered 2022-01-31 – 2022-02-01 (×2): 2 [IU] via SUBCUTANEOUS
  Administered 2022-02-01: 1 [IU] via SUBCUTANEOUS
  Administered 2022-02-01 – 2022-02-02 (×4): 2 [IU] via SUBCUTANEOUS

## 2022-01-25 MED ORDER — INSULIN ASPART 100 UNIT/ML IJ SOLN: 0.0000 [IU] | Freq: Every day | INTRAMUSCULAR | Status: DC

## 2022-01-25 MED ORDER — ACETAMINOPHEN 325 MG PO TABS
650.0000 mg | ORAL_TABLET | Freq: Four times a day (QID) | ORAL | Status: DC | PRN
Start: 2022-01-25 — End: 2022-02-02

## 2022-01-25 MED ORDER — ONDANSETRON HCL 4 MG/2ML IJ SOLN: 4.0000 mg | Freq: Four times a day (QID) | INTRAMUSCULAR | Status: DC | PRN

## 2022-01-25 MED ORDER — HYDRALAZINE HCL 50 MG PO TABS
100.0000 mg | ORAL_TABLET | Freq: Three times a day (TID) | ORAL | Status: DC
  Administered 2022-01-25 – 2022-02-02 (×24): 100 mg via ORAL
  Filled 2022-01-25 (×24): qty 2

## 2022-01-25 NOTE — ED Notes (Signed)
Pt given a urinal and made aware of need for UA ?

## 2022-01-25 NOTE — Hospital Course (Signed)
Manuel Kramer is a 66 year old male with past medical history significant for type 2 diabetes mellitus, essential hypertension, CKD stage IIIb, anemia of chronic kidney disease, depression/anxiety, chronic diarrhea, Hx ischemic CVA, with questionable cognitive impairment who presented to [facility] ED via EMS from [facility] with confusion, worsening renal function and recent fall day prior.  Given patient is a poor historian with confusion, spouse at bedside contributes to the history; although she has very poor knowledge of his medical conditions and has not seen him regularly at the SNF.  Patient reports worsening lower extremity edema over the last 2 weeks and apparently nursing facility has increased his Lasix without improvement.  Labs from SNF with creatinine 2.6, baseline of 1.84 on 12/05/2021.  Also reports left hip pain and nursing home noted fall yesterday.  No other specific complaints or concerns at this time.  Denies headache, no dizziness, no chest pain, no palpitations, no shortness of breath, no cough/congestion, no nausea/vomiting, no abdominal pain, no weakness, no fatigue, no paresthesias.   ? ?In the ED, temperature 98.4 ?F, HR 74, RR 17, BP 201/99, SPO2 100% on room air.  WBC 15.9, hemoglobin 11.0, platelets 192.  Sodium 138, potassium 3.1, chloride 112, CO2 17, BUN 35, creatinine 3.18, glucose 106.  Chest x-ray with cardiomegaly with central vascular prominence without overt pulmonary edema or focal airspace consolidation.  EDP consulted [facility] for admission for acute on chronic renal failure. ? ?

## 2022-01-25 NOTE — Assessment & Plan Note (Addendum)
Continue atorvastatin 40 mg p.o. daily. ?

## 2022-01-25 NOTE — ED Provider Notes (Signed)
Blood pressure (!) 169/96, pulse 74, temperature 98.4 ?F (36.9 ?C), temperature source Oral, resp. rate 17, SpO2 100 %. ? ?Assuming care from Dr. Rubin Payor.  In short, Manuel Kramer is a 66 y.o. male with a chief complaint of Abnormal labs- "kidney failure" ?Marland Kitchen  Refer to the original H&P for additional details. ? ?The current plan of care is to follow up on labs and likely admit. ? ?04:25 PM  ?Patient's creatinine coming back at 3.18 with GFR of 21.  Mild hypokalemia at 3.1. CXR independently evaluated and agree with radiology interpretation.  ? ?Discussed patient's case with [facility] to request admission. Patient and family (if present) updated with plan. Care transferred to [facility] service. ? ?I reviewed all nursing notes, vitals, pertinent old records, EKGs, labs, imaging (as available). ? ? ?  ?Maia Plan, MD ?01/25/22 1648 ? ?

## 2022-01-25 NOTE — Assessment & Plan Note (Addendum)
BP 201/99; uncontrolled.  On amlodipine 10 mg p.o. daily, carvedilol 25 mg p.o. twice daily, hydralazine 100 mg p.o. 3 times daily, clonidine 0.3mg  PO TID and spironolactone outpatient. ?--Holding spironolactone and furosemide due to AKI as above ?--Amlodipine 10 mg p.o. daily ?--Carvedilol 25 g p.o. BID ?--Hydralazine 100 mg p.o. TID ?--Clonidine 0.3 mg p.o. TID ?--Labetalol 20 mg IV every 4 hours PRN SBP >180 or DBP >110 ?

## 2022-01-25 NOTE — Assessment & Plan Note (Addendum)
GI PCR panel negative, C. difficile positive.  Afebrile. ?--C. difficile positive as above on oral vancomycin ? ?

## 2022-01-25 NOTE — ED Provider Notes (Signed)
?[facility] EMERGENCY DEPARTMENT ?Provider Note ? ? ?CSN: [id number] ?Arrival date & time: 01/25/22  1412 ? ?  ? ?History ? ?Chief Complaint  ?Patient presents with  ? Abnormal labs- "kidney failure"  ? ? ?Manuel Kramer is a 66 y.o. male. ? ?HPI ?Patient brought in from nursing home for worsening renal function.  Reportedly had a fall.  Has had some nausea vomiting diarrhea.  Decreased oral intake.  Some confusion.  Found to be in "renal failure".  Had creatinine drawn that was 2.6.  Around 2 weeks ago it was 2.0 and 2 months ago was 1.8.  Has increasing edema on the legs.  Reportedly has had increase in his Lasix also.  Patient states he feels a little bad all over ?  ? ?Home Medications ?Prior to Admission medications   ?Medication Sig Start Date End Date Taking? Authorizing Provider  ?acetaminophen (TYLENOL) 500 MG tablet Take 1 tablet (500 mg total) by mouth every 6 (six) hours as needed. ?Patient taking differently: Take 500 mg by mouth every 6 (six) hours as needed for mild pain or moderate pain. 08/22/16   Elpidio Anis, PA-C  ?amLODipine (NORVASC) 10 MG tablet Take 1 tablet (10 mg total) by mouth daily. 01/05/17   Hoy Register, MD  ?atorvastatin (LIPITOR) 40 MG tablet Take 1 tablet (40 mg total) by mouth daily. 12/06/21   Briant Cedar, MD  ?carvedilol (COREG) 25 MG tablet Take 1 tablet (25 mg total) by mouth 2 (two) times daily with a meal. 12/05/21   Briant Cedar, MD  ?cloNIDine (CATAPRES) 0.1 MG tablet Take 1 tablet (0.1 mg total) by mouth 2 (two) times daily. 09/14/16   Hoy Register, MD  ?Empagliflozin-linaGLIPtin (GLYXAMBI PO) Take 1 tablet by mouth daily.    [provider]  ?furosemide (LASIX) 40 MG tablet Take 1 tablet (40 mg total) by mouth daily. 12/05/21 01/04/22  Briant Cedar, MD  ?hydrALAZINE (APRESOLINE) 100 MG tablet Take 1 tablet (100 mg total) by mouth every 8 (eight) hours. 12/05/21   Briant Cedar, MD  ?insulin glargine (LANTUS) 100  UNIT/ML Solostar Pen Inject 45 Units into the skin at bedtime. 12/05/21   Briant Cedar, MD  ?loperamide (IMODIUM) 2 MG capsule Take 1 capsule (2 mg total) by mouth as needed for diarrhea or loose stools. 12/05/21   Briant Cedar, MD  ?Multiple Vitamin (MULTIVITAMIN WITH MINERALS) TABS tablet Take 1 tablet by mouth daily. 12/06/21   Briant Cedar, MD  ?   ? ?Allergies    ?Patient has no known allergies.   ? ?Review of Systems   ?Review of Systems  ?Constitutional:  Positive for appetite change.  ?Cardiovascular:  Positive for leg swelling.  ?Gastrointestinal:  Positive for diarrhea, nausea and vomiting.  ?Musculoskeletal:  Negative for back pain.  ?Neurological:  Negative for weakness.  ? ?Physical Exam ?Updated Vital Signs ?BP (!) 169/96   Pulse 74   Temp 98.4 ?F (36.9 ?C) (Oral)   Resp 17   SpO2 100%  ?Physical Exam ?Vitals and nursing note reviewed.  ?HENT:  ?   Head: Normocephalic.  ?Cardiovascular:  ?   Rate and Rhythm: Normal rate.  ?Abdominal:  ?   Tenderness: There is no abdominal tenderness.  ?Musculoskeletal:  ?   Right lower leg: Edema present.  ?   Left lower leg: Edema present.  ?   Comments: Moderate pitting edema bilateral lower extremities.  ?Skin: ?   General: Skin is warm.  ?  Capillary Refill: Capillary refill takes less than 2 seconds.  ?Neurological:  ?   Mental Status: He is alert and oriented to person, place, and time.  ? ? ?ED Results / Procedures / Treatments   ?Labs ?(all labs ordered are listed, but only abnormal results are displayed) ?Labs Reviewed  ?CBC WITH DIFFERENTIAL/PLATELET - Abnormal; Notable for the following components:  ?    Result Value  ? WBC 15.9 (*)   ? RBC 3.44 (*)   ? Hemoglobin 11.0 (*)   ? HCT 33.1 (*)   ? Neutro Abs 13.2 (*)   ? Monocytes Absolute 1.8 (*)   ? Abs Immature Granulocytes 0.09 (*)   ? All other components within normal limits  ?COMPREHENSIVE METABOLIC PANEL  ?URINALYSIS, ROUTINE W REFLEX MICROSCOPIC  ?BRAIN NATRIURETIC PEPTIDE   ? ? ?EKG ?EKG Interpretation ? ?Date/Time:  Wednesday January 25 2022 14:19:12 EDT ?Ventricular Rate:  76 ?PR Interval:  139 ?QRS Duration: 96 ?QT Interval:  407 ?QTC Calculation: 458 ?R Axis:   13 ?Text Interpretation: Sinus rhythm Probable left atrial enlargement Left ventricular hypertrophy Anterior Q waves, possibly due to LVH Confirmed by Benjiman Core [phone]) on 01/25/2022 4:10:43 PM ? ?Radiology ?DG Chest Portable 1 View ? ?Result Date: 01/25/2022 ?CLINICAL DATA:  Intermittent confusion today with nausea vomiting and diarrhea, AK I, concern for edema. EXAM: PORTABLE CHEST 1 VIEW COMPARISON:  December 05, 2021 FINDINGS: Similar cardiomegaly with central vascular prominence. Aortic atherosclerosis. No overt pulmonary edema or focal airspace consolidation. Linear atelectasis in the left mid lung. No visible pleural effusion or pneumothorax. No acute osseous abnormality. IMPRESSION: Similar cardiomegaly with central vascular prominence. No overt pulmonary edema or focal airspace consolidation. Electronically Signed   By: Maudry Mayhew M.D.   On: 01/25/2022 15:18   ? ?Procedures ?Procedures  ? ? ?Medications Ordered in ED ?Medications - No data to display ? ?ED Course/ Medical Decision Making/ A&P ?  ?                        ?Medical Decision Making ?Amount and/or Complexity of Data Reviewed ?Labs: ordered. ?Radiology: ordered. ? ? ?Patient brought in for worsening kidney function.  Creatinine up to 2.6 on recent blood work.  2.0 a week and a half ago.  1.8 prior to that.  Reportedly has been on increasing Lasix for his peripheral edema.  Chest x-ray independently interpreted and showed no frank edema.  However with worsening kidney function increasing edema likely require admission to the hospital.  Lab work pending.  Care turned over to Dr. Jacqulyn Bath.  I have reviewed the paperwork that came with the patient ? ? ? ? ? ? ? ?Final Clinical Impression(s) / ED Diagnoses ?Final diagnoses:  ?AKI (acute kidney injury)  (HCC)  ? ? ?Rx / DC Orders ?ED Discharge Orders   ? ? None  ? ?  ? ? ?  ?Benjiman Core, MD ?01/25/22 1611 ? ?

## 2022-01-25 NOTE — H&P (Signed)
?History and Physical  ? ? ?[name] [id number] DOB: 07/28/1956 DOA: 01/25/2022 ? ?PCP: Vonna Drafts, FNP  ?Patient coming from:  ? ?I have personally briefly reviewed patient's old medical records in [place] ? ?Chief Complaint:  ? ?HPI:  ? ?Manuel Kramer is a 66 year old male with past medical history significant for type 2 diabetes mellitus, essential hypertension, CKD stage IIIb, anemia of chronic kidney disease, depression/anxiety, chronic diarrhea, Hx ischemic CVA, with questionable cognitive impairment who presented to [facility] ED via EMS from [facility] SNF with confusion, worsening renal function and recent fall day prior.  Given patient is a poor historian with confusion, spouse at bedside contributes to the history; although she has very poor knowledge of his medical conditions and has not seen him regularly at the SNF.  Patient reports worsening lower extremity edema over the last 2 weeks and apparently nursing facility has increased his Lasix without improvement.  Labs from SNF with creatinine 2.6, baseline of 1.84 on 12/05/2021.  Also reports left hip pain and nursing home noted fall yesterday.  No other specific complaints or concerns at this time.  Denies headache, no dizziness, no chest pain, no palpitations, no shortness of breath, no cough/congestion, no nausea/vomiting, no abdominal pain, no weakness, no fatigue, no paresthesias.   ? ?In the ED, temperature 98.4 ?F, HR 74, RR 17, BP 201/99, SPO2 100% on room air.  WBC 15.9, hemoglobin 11.0, platelets 192.  Sodium 138, potassium 3.1, chloride 112, CO2 17, BUN 35, creatinine 3.18, glucose 106.  Chest x-ray with cardiomegaly with central vascular prominence without overt pulmonary edema or focal airspace consolidation.  EDP consulted [facility] for admission for acute on chronic renal failure. ? ? ? ?Review of Systems:  ?Constitutional - No Fatigue, No Weight Loss, + confusion ?Vision - No impaired vision, decreased visual  acuity ?Ear/Nose/Mouth/Throat - No decreased hearing, no congestion ?Respiratory - No shortness of breath, no exertional dyspnea, chronic cough ?Cardiovascular - No chest pain, no palpitations, no peripheral edema ?Gastrointestinal - No nausea, + diarrhea, no constipation, ?Genitourinary - No excessive urination, no urinary incontinence ?Integumentary - No rashes or concerning skin lesions, + LE edema ?Neurologic - No numbness, no tingling, no dizziness, no headaches, no confusion or memory loss ? ? ?Past Medical History:  ?Diagnosis Date  ? Chronic diarrhea   ? Diabetes mellitus without complication ([place])   ? Hypertension   ? ? ?Past Surgical History:  ?Procedure Laterality Date  ? COLOSCOPY     ? POLYP  ? ? ? reports that he has been smoking cigarettes. He has a 20.00 pack-year smoking history. He has quit using smokeless tobacco. He reports that he does not currently use alcohol after a past usage of about 1.0 standard drink per week. He reports that he does not use drugs. ? ?No Known Allergies ? ?Family History  ?Problem Relation Age of Onset  ? Cancer Mother   ? Diabetes Mother   ? Hypertension Mother   ? Cancer Father   ? ? ?Family history reviewed and not pertinent  ? ?Prior to Admission medications   ?Medication Sig Start Date End Date Taking? Authorizing Provider  ?acetaminophen (TYLENOL) 500 MG tablet Take 1 tablet (500 mg total) by mouth every 6 (six) hours as needed. ?Patient taking differently: Take 500 mg by mouth every 6 (six) hours as needed for mild pain or moderate pain. 08/22/16   Charlann Lange, PA-C  ?amLODipine (NORVASC) 10 MG tablet Take 1 tablet (10 mg  total) by mouth daily. 01/05/17   Charlott Rakes, MD  ?atorvastatin (LIPITOR) 40 MG tablet Take 1 tablet (40 mg total) by mouth daily. 12/06/21   Alma Friendly, MD  ?carvedilol (COREG) 25 MG tablet Take 1 tablet (25 mg total) by mouth 2 (two) times daily with a meal. 12/05/21   Alma Friendly, MD  ?cloNIDine (CATAPRES) 0.1 MG tablet  Take 1 tablet (0.1 mg total) by mouth 2 (two) times daily. 09/14/16   Charlott Rakes, MD  ?Empagliflozin-linaGLIPtin (GLYXAMBI PO) Take 1 tablet by mouth daily.    [provider]  ?furosemide (LASIX) 40 MG tablet Take 1 tablet (40 mg total) by mouth daily. 12/05/21 01/04/22  Alma Friendly, MD  ?hydrALAZINE (APRESOLINE) 100 MG tablet Take 1 tablet (100 mg total) by mouth every 8 (eight) hours. 12/05/21   Alma Friendly, MD  ?insulin glargine (LANTUS) 100 UNIT/ML Solostar Pen Inject 45 Units into the skin at bedtime. 12/05/21   Alma Friendly, MD  ?loperamide (IMODIUM) 2 MG capsule Take 1 capsule (2 mg total) by mouth as needed for diarrhea or loose stools. 12/05/21   Alma Friendly, MD  ?Multiple Vitamin (MULTIVITAMIN WITH MINERALS) TABS tablet Take 1 tablet by mouth daily. 12/06/21   Alma Friendly, MD  ? ? ?Physical Exam: ?Vitals:  ? 01/25/22 1422 01/25/22 1500 01/25/22 1515 01/25/22 1530  ?BP: (!) 201/99 (!) 165/72 (!) 162/71 (!) 169/96  ?Pulse: 76 73 75 74  ?Resp: 14 16 15 17   ?Temp: 98.4 ?F (36.9 ?C)     ?TempSrc: Oral     ?SpO2: 100% 100% 100% 100%  ? ? ?Physical Exam: ?GEN: NAD, alert and oriented to place [facility]), but not time (2020) or person (President: "I know it is not Eulas Post"), chronically ill in appearance ?HEENT: NCAT, PERRL, EOMI, sclera clear, MMM ?PULM: CTAB w/o wheezes/crackles, normal respiratory effort, on room air ?CV: RRR w/o M/G/R ?GI: abd soft, NTND, NABS, no R/G/M ?MSK: 2-3+ pitting edema bilateral lower extremities to knee, moves all extremities independently, + TTP left lateral hip ?NEURO: CN II-XII intact, no focal deficits, sensation to light touch intact ?PSYCH: normal mood/affect ?Integumentary: dry/intact, no rashes or wounds ? ? ?Labs on Admission: I have personally reviewed following labs and imaging studies ? ?CBC: ?Recent Labs  ?Lab 01/25/22 ?1528  ?WBC 15.9*  ?NEUTROABS 13.2*  ?HGB 11.0*  ?HCT 33.1*  ?MCV 96.2  ?PLT 192  ? ?Basic  Metabolic Panel: ?Recent Labs  ?Lab 01/25/22 ?1528 01/25/22 ?1722  ?NA 138  --   ?K 3.1*  --   ?CL 112*  --   ?CO2 17*  --   ?GLUCOSE 106*  --   ?BUN 35*  --   ?CREATININE 3.18* 3.10*  ?CALCIUM 8.4*  --   ? ?GFR: ?CrCl cannot be calculated (Unknown ideal weight.). ?Liver Function Tests: ?Recent Labs  ?Lab 01/25/22 ?1528  ?AST 28  ?ALT 36  ?ALKPHOS 73  ?BILITOT 0.6  ?PROT 5.3*  ?ALBUMIN 2.5*  ? ?No results for input(s): LIPASE, AMYLASE in the last 168 hours. ?No results for input(s): AMMONIA in the last 168 hours. ?Coagulation Profile: ?No results for input(s): INR, PROTIME in the last 168 hours. ?Cardiac Enzymes: ?No results for input(s): CKTOTAL, CKMB, CKMBINDEX, TROPONINI in the last 168 hours. ?BNP (last 3 results) ?No results for input(s): PROBNP in the last 8760 hours. ?HbA1C: ?No results for input(s): HGBA1C in the last 72 hours. ?CBG: ?No results for input(s): GLUCAP in the last  168 hours. ?Lipid Profile: ?No results for input(s): CHOL, HDL, LDLCALC, TRIG, CHOLHDL, LDLDIRECT in the last 72 hours. ?Thyroid Function Tests: ?No results for input(s): TSH, T4TOTAL, FREET4, T3FREE, THYROIDAB in the last 72 hours. ?Anemia Panel: ?No results for input(s): VITAMINB12, FOLATE, FERRITIN, TIBC, IRON, RETICCTPCT in the last 72 hours. ?Urine analysis: ?   ?Component Value Date/Time  ? COLORURINE YELLOW 11/14/2021 [id number]  ? APPEARANCEUR CLEAR 11/14/2021 [id number]  ? LABSPEC 1.010 11/14/2021 0712  ? PHURINE 7.0 11/14/2021 0712  ? GLUCOSEU >=500 (A) 11/14/2021 [id number]  ? HGBUR LARGE (A) 11/14/2021 0712  ? [place] NEGATIVE 11/14/2021 [id number]  ? [place] NEGATIVE 11/14/2021 [id number]  ? PROTEINUR >300 (A) 11/14/2021 [id number]  ? UROBILINOGEN 0.2 01/01/2015 1118  ? NITRITE NEGATIVE 11/14/2021 [id number]  ? LEUKOCYTESUR NEGATIVE 11/14/2021 [id number]  ? ? ?Radiological Exams on Admission: ?DG Chest Portable 1 View ? ?Result Date: 01/25/2022 ?CLINICAL DATA:  Intermittent confusion today with nausea vomiting and diarrhea, AK I, concern for edema. EXAM: PORTABLE  CHEST 1 VIEW COMPARISON:  December 05, 2021 FINDINGS: Similar cardiomegaly with central vascular prominence. Aortic atherosclerosis. No overt pulmonary edema or focal airspace consolidation. Linear atelectasis in the left

## 2022-01-25 NOTE — Assessment & Plan Note (Addendum)
Nursing home noted fall yesterday.  Patient complaining of left hip pain with active/passive range of motion.  Left hip/pelvis x-ray with no acute osseous abnormality. ?--PT/OT following, recommend return to SNF ?

## 2022-01-25 NOTE — Assessment & Plan Note (Addendum)
Patient with confusion, unable to discern who the president is or what the year is.  Per spouse at bedside, has been unable to answer these questions correctly for some time.  Patient with history of ischemic CVA, unclear if patient with cognitive impairment versus developing dementia.  Patient is afebrile, no concerning consolidation on chest x-ray, unlikely infectious etiology.  BUN only 35.  CT head without contrast negative for acute findings. ?--SLP evaluated for cognitive impairment, appears to be at baseline following recent stroke ?--Supportive care ?

## 2022-01-25 NOTE — Assessment & Plan Note (Addendum)
--  Continue sertraline 25 mg p.o. daily ?

## 2022-01-25 NOTE — Assessment & Plan Note (Addendum)
Patient with progressive lower extremity edema bilaterally.  Apparently nursing home has been increasing Lasix without improvement and now with worsening renal failure.  Chest x-ray with no overt pulmonary edema.  D-dimer elevated 2.04.  Vascular duplex ultrasound bilateral lower extremities negative for DVT, although limited study; but notable for enlarged lymph nodes in the groin.  CT abdomen/pelvis without contrast with diffuse wall thickening ascending and transverse colon, pericolic stranding adjacent to the ascending colon consistent with infectious colitis, no evidence of obstruction or pneumoperitoneum, no hydronephrosis, right inguinal hernia, enlarged lymph nodes inguinal regions suggestive of reactive hyperplasia. ?--TED hose, lower extremity elevation ?

## 2022-01-25 NOTE — Assessment & Plan Note (Addendum)
Patient presenting from SNF with elevated creatinine at facility of 2.6; and on presentation to the ED now up to 3.18.  Apparently SNF has been increasing his Lasix due to progressive lower extremity edema.  Also patient's blood pressure very poorly controlled with poorly controlled diabetes likely contributing factor to progressive renal failure and volume loss with positive C. difficile colitis.  Renal ultrasound with no acute findings.  Urine sodium 22, urine creatinine 96; FeNa = 0.5%; which is consistent with prerenal. ?--Cr 3.18>3.10>3.09>3.05>2.59>2.36>2.26>2.07 ?--NS at 100 mL/h  ?--Avoid nephrotoxins, renally dose all medications ?--Strict I's and O's and daily weights ?--BMP daily ?

## 2022-01-25 NOTE — Assessment & Plan Note (Addendum)
Patient has been at [facility] for several months ?--PT/OT following ?--TOC for assistance with discharge planning ?

## 2022-01-25 NOTE — Assessment & Plan Note (Addendum)
Hemoglobin 11.1, stable. ?

## 2022-01-25 NOTE — ED Triage Notes (Signed)
Pt BIB [facility] from [facility] d/t his labs showing he was in "Acute Kidney Failure" (per EMS). Pt is reported to have fell last night as well with no residual injury. Pt has had n/v/d, intermittent confusion-alert to self & situation (not baseline), BLE swelling, his lasix was increased recently. EMS reports 200/100, 18g PIV in Lt AC, CBG 112, 74 bpm, 100% on RA, 16 Resp. ?

## 2022-01-25 NOTE — Assessment & Plan Note (Addendum)
Hemoglobin A1c 5.2, well controlled.  Patient is prescribed Lantus 45 units [place] daily and Glyxambi, but review of SNF records appears that he has not been receiving insulin.  Hemoglobin A1c 5.2, well controlled. ?--Semglee 8u  daily ?--SSI for coverage ?--CBG before every meal/at bedtime ?

## 2022-01-25 NOTE — Assessment & Plan Note (Addendum)
TTE 11/18/2021 with LVEF 60-65%, no regional wall motion normalities, grade 1 diastolic dysfunction, IVC normal.  Chest x-ray with no overt pulmonary edema. ?--Holding home Lasix due to worsening renal failure ?--Strict I's and O's and daily weights ?

## 2022-01-25 NOTE — ED Notes (Signed)
Got patient on the monitor did ekg patient is resting with call bell in reach ?

## 2022-01-26 ENCOUNTER — Inpatient Hospital Stay (HOSPITAL_COMMUNITY): Payer: Medicaid Other

## 2022-01-26 DIAGNOSIS — E876 Hypokalemia: Secondary | ICD-10-CM | POA: Diagnosis present

## 2022-01-26 DIAGNOSIS — R600 Localized edema: Secondary | ICD-10-CM | POA: Diagnosis not present

## 2022-01-26 DIAGNOSIS — M7989 Other specified soft tissue disorders: Secondary | ICD-10-CM | POA: Diagnosis not present

## 2022-01-26 DIAGNOSIS — I5032 Chronic diastolic (congestive) heart failure: Secondary | ICD-10-CM

## 2022-01-26 DIAGNOSIS — D638 Anemia in other chronic diseases classified elsewhere: Secondary | ICD-10-CM | POA: Diagnosis not present

## 2022-01-26 DIAGNOSIS — G9340 Encephalopathy, unspecified: Secondary | ICD-10-CM | POA: Diagnosis not present

## 2022-01-26 DIAGNOSIS — A0472 Enterocolitis due to Clostridium difficile, not specified as recurrent: Secondary | ICD-10-CM | POA: Diagnosis present

## 2022-01-26 DIAGNOSIS — N179 Acute kidney failure, unspecified: Secondary | ICD-10-CM | POA: Diagnosis not present

## 2022-01-26 HISTORY — DX: Enterocolitis due to Clostridium difficile, not specified as recurrent: A04.72

## 2022-01-26 LAB — BASIC METABOLIC PANEL
Anion gap: 9 (ref 5–15)
BUN: 33 mg/dL — ABNORMAL HIGH (ref 8–23)
CO2: 18 mmol/L — ABNORMAL LOW (ref 22–32)
Calcium: 8.2 mg/dL — ABNORMAL LOW (ref 8.9–10.3)
Chloride: 111 mmol/L (ref 98–111)
Creatinine, Ser: 3.09 mg/dL — ABNORMAL HIGH (ref 0.61–1.24)
GFR, Estimated: 22 mL/min — ABNORMAL LOW (ref >60)
Glucose, Bld: 149 mg/dL — ABNORMAL HIGH (ref 70–99)
Potassium: 2.8 mmol/L — ABNORMAL LOW (ref 3.5–5.1)
Sodium: 138 mmol/L (ref 135–145)

## 2022-01-26 LAB — URINALYSIS, ROUTINE W REFLEX MICROSCOPIC
Bilirubin Urine: NEGATIVE
Glucose, UA: >=500 mg/dL — AB
Hgb urine dipstick: NEGATIVE
Ketones, ur: NEGATIVE mg/dL
Leukocytes,Ua: NEGATIVE
Nitrite: NEGATIVE
Protein, ur: >300 mg/dL — AB
Specific Gravity, Urine: 1.025 (ref 1.005–1.030)
pH: 5 (ref 5.0–8.0)

## 2022-01-26 LAB — C DIFFICILE QUICK SCREEN W PCR REFLEX
C Diff antigen: POSITIVE — AB
C Diff interpretation: DETECTED
C Diff toxin: POSITIVE — AB

## 2022-01-26 LAB — CBC
HCT: 31.5 % — ABNORMAL LOW (ref 39.0–52.0)
Hemoglobin: 10.5 g/dL — ABNORMAL LOW (ref 13.0–17.0)
MCH: 31.9 pg (ref 26.0–34.0)
MCHC: 33.3 g/dL (ref 30.0–36.0)
MCV: 95.7 fL (ref 80.0–100.0)
Platelets: 181 10*3/uL (ref 150–400)
RBC: 3.29 MIL/uL — ABNORMAL LOW (ref 4.22–5.81)
RDW: 13.8 % (ref 11.5–15.5)
WBC: 14.5 10*3/uL — ABNORMAL HIGH (ref 4.0–10.5)
nRBC: 0 % (ref 0.0–0.2)

## 2022-01-26 LAB — GASTROINTESTINAL PANEL BY PCR, STOOL (REPLACES STOOL CULTURE)

## 2022-01-26 LAB — GLUCOSE, CAPILLARY
Glucose-Capillary: 143 mg/dL — ABNORMAL HIGH (ref 70–99)
Glucose-Capillary: 130 mg/dL — ABNORMAL HIGH (ref 70–99)
Glucose-Capillary: 152 mg/dL — ABNORMAL HIGH (ref 70–99)

## 2022-01-26 LAB — TSH: TSH: 1.448 u[IU]/mL (ref 0.350–4.500)

## 2022-01-26 LAB — URINALYSIS, MICROSCOPIC (REFLEX): Bacteria, UA: NONE SEEN

## 2022-01-26 LAB — CBG MONITORING, ED: Glucose-Capillary: 123 mg/dL — ABNORMAL HIGH (ref 70–99)

## 2022-01-26 LAB — BRAIN NATRIURETIC PEPTIDE: B Natriuretic Peptide: 888.4 pg/mL — ABNORMAL HIGH (ref 0.0–100.0)

## 2022-01-26 LAB — SODIUM, URINE, RANDOM: Sodium, Ur: 22 mmol/L

## 2022-01-26 LAB — CREATININE, URINE, RANDOM: Creatinine, Urine: 96.65 mg/dL

## 2022-01-26 LAB — HEMOGLOBIN A1C
Hgb A1c MFr Bld: 5.2 % (ref 4.8–5.6)
Mean Plasma Glucose: 102.54 mg/dL

## 2022-01-26 LAB — D-DIMER, QUANTITATIVE: D-Dimer, Quant: 2.04 ug/mL-FEU — ABNORMAL HIGH (ref 0.00–0.50)

## 2022-01-26 LAB — MAGNESIUM: Magnesium: 2.2 mg/dL (ref 1.7–2.4)

## 2022-01-26 IMAGING — CT CT ABD-PELV W/O CM
2 of 4 series · 15 of 46 positions shown, 17 images · non-contrast
Comparison: [DATE]

CLINICAL DATA: Lymphadenopathy in the inguinal regions



[Series 3: ap without · axial · non-contrast · 0.71mm/px · z∈[-459,-84]mm · 12 of 85 slices shown, 14 images]
[im 5/85  soft-tissue]
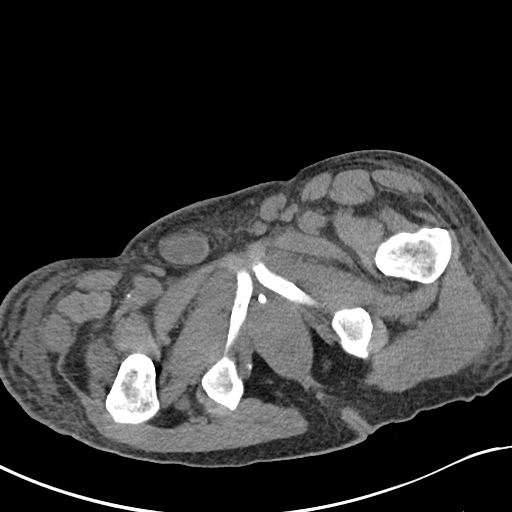
[im 5/85  bone]
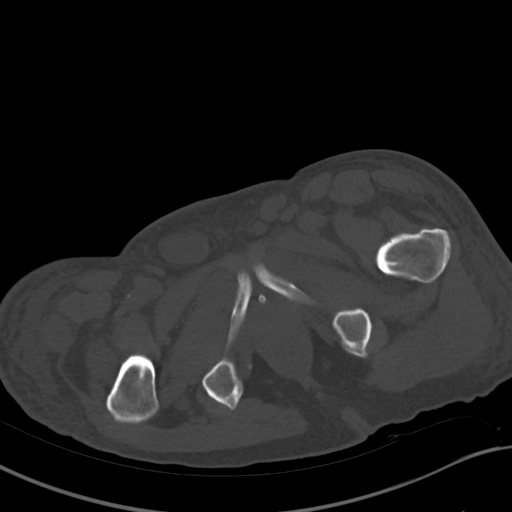
[im 14/85  soft-tissue]
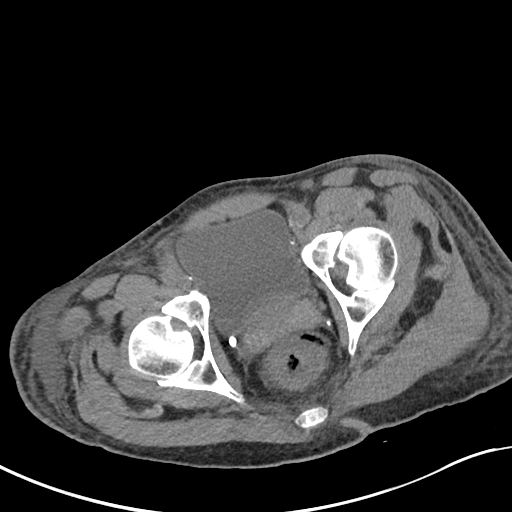
[im 18/85  soft-tissue]
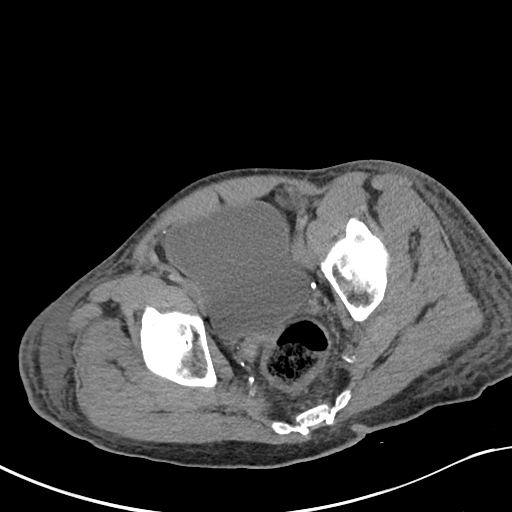
[im 27/85  soft-tissue]
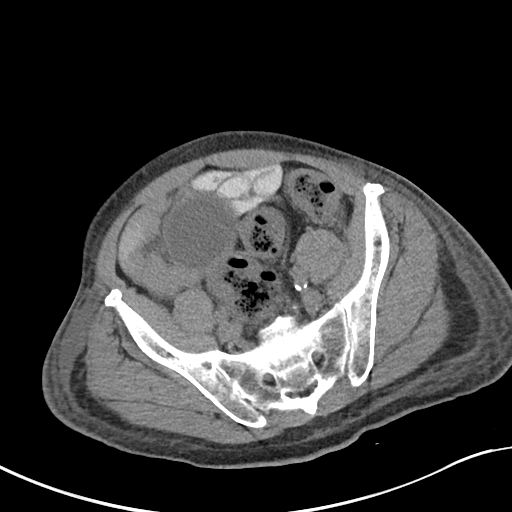
[im 31/85  soft-tissue]
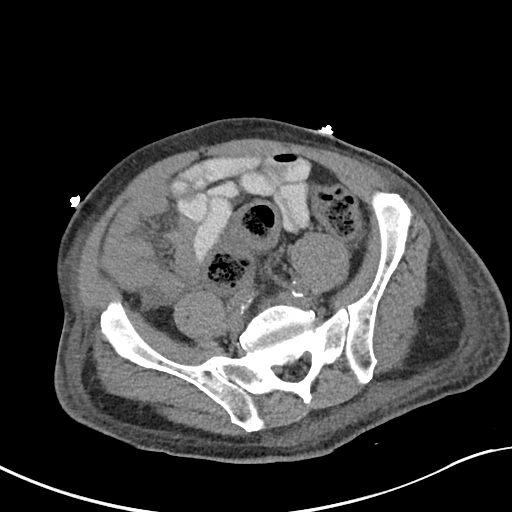
[im 40/85  soft-tissue]
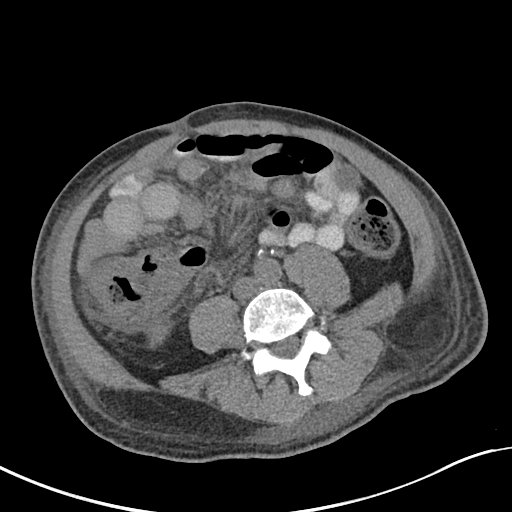
[im 45/85  soft-tissue]
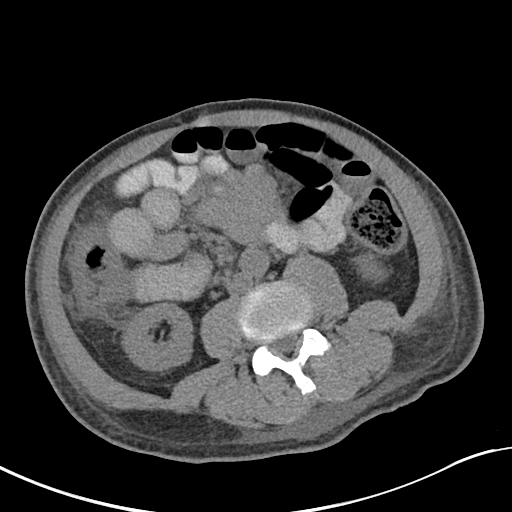
[im 54/85  soft-tissue]
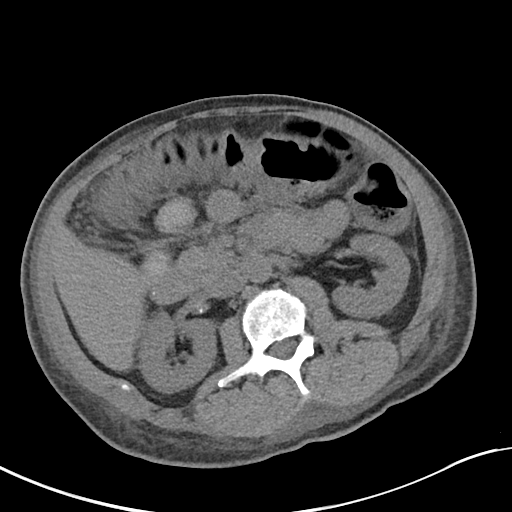
[im 58/85  soft-tissue]
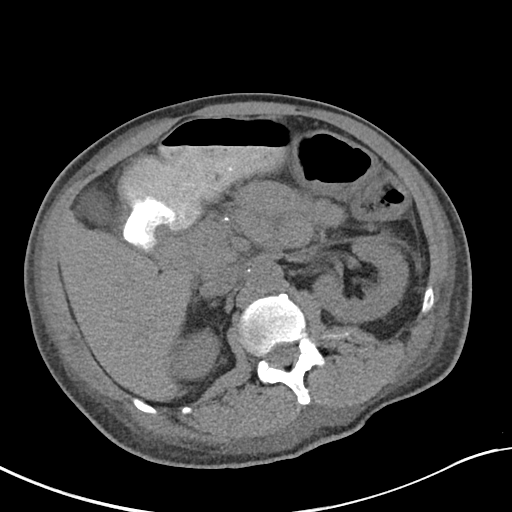
[im 58/85  bone]
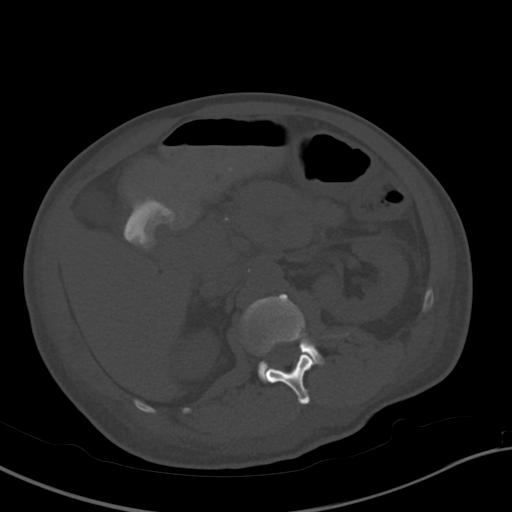
[im 67/85  soft-tissue]
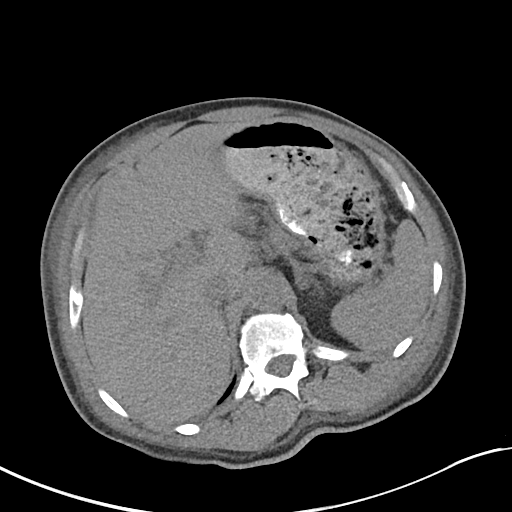
[im 71/85  soft-tissue]
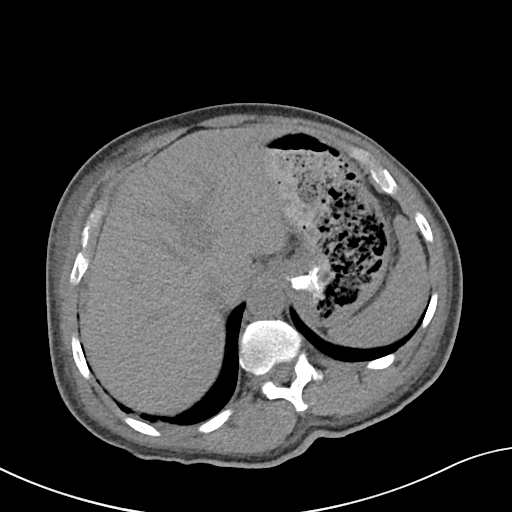
[im 80/85  soft-tissue]
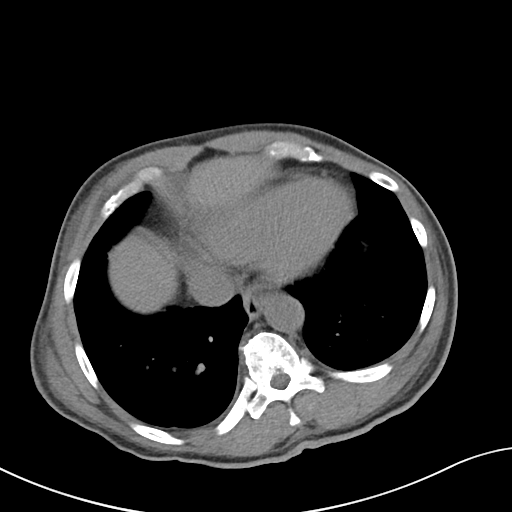

[Series 6: cor · coronal · 0.73mm/px · 3 of 92 slices shown]
[im 31/92  soft-tissue]
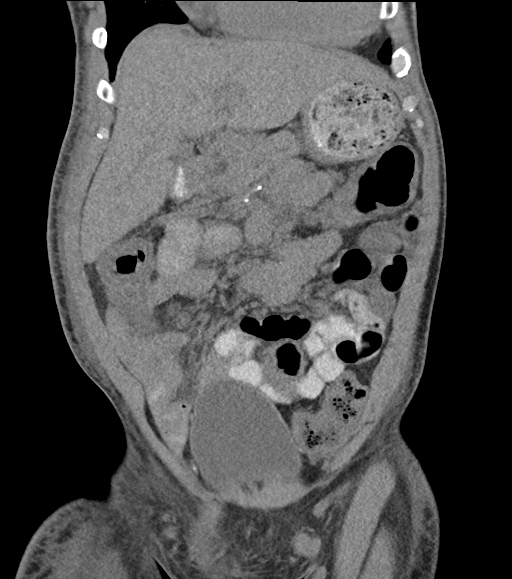
[im 41/92  soft-tissue]
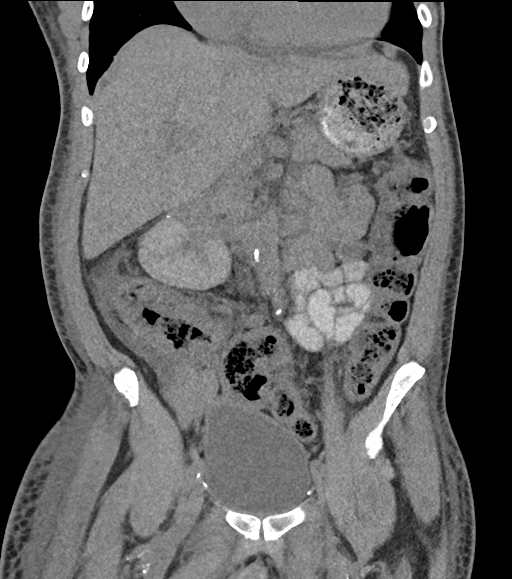
[im 51/92  soft-tissue]
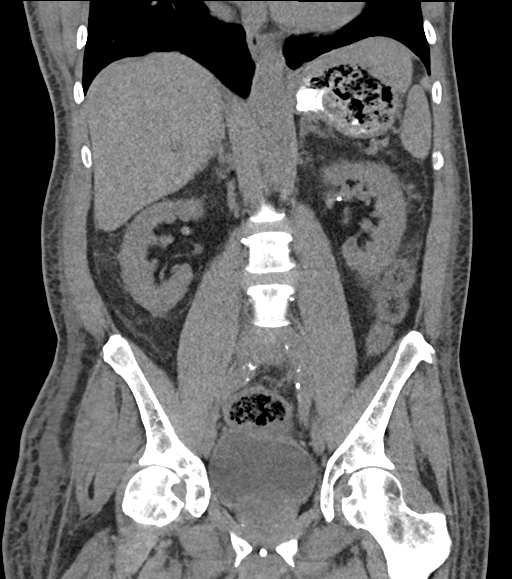

[15 of 46 positions shown; findings below may reference images not displayed]

FINDINGS: Lower chest: Heart is enlarged in size. Small linear densities in
the lower lung fields may suggest scarring or subsegmental
atelectasis.

Hepatobiliary: Liver measures 18.5 cm in length. No focal
abnormality is seen. There is no dilation of bile ducts. Gallbladder
is unremarkable.

Pancreas: No focal abnormality is seen.

Spleen: There are few coarse calcifications. Spleen is not enlarged.

Adrenals/Urinary Tract: Adrenals are unremarkable. There is no
hydronephrosis. There are scattered arterial calcifications in both
kidneys. There are no definite renal or ureteral stones. Urinary
bladder is not distended.

Stomach/Bowel: Stomach is unremarkable. Small hiatal hernia is seen.
Small bowel loops are unremarkable. Appendix is not distinctly seen.
There is abnormal wall thickening in the ascending and transverse
colon. There is pericolic stranding adjacent to ascending colon and
hepatic flexure. There is no pericolic stranding in the descending
and sigmoid colon.

Vascular/Lymphatic: Scattered arterial calcifications are seen.
There are slightly enlarged lymph nodes in the mesentery and
retroperitoneum.

Reproductive: Prostate is enlarged.

Other: There is no ascites or pneumoperitoneum. Small umbilical
hernia containing fat is seen. There is fluid in the right inguinal
canal. There are few slightly enlarged lymph nodes in both inguinal
regions largest measuring 13 mm in short axis in the left inguinal
region. There is edema in the subcutaneous plane in the abdominal
wall and gluteal regions suggesting anasarca.

Musculoskeletal: Unremarkable.
IMPRESSION: There is diffuse wall thickening in the ascending and transverse
colon. There is pericolic stranding adjacent to the ascending colon.
Findings suggest possible inflammatory or infectious colitis.

There is no evidence intestinal obstruction or pneumoperitoneum.
There is no hydronephrosis.

Small hiatal hernia. Prostate is enlarged. There is edema in the
subcutaneous plane suggesting anasarca.

There is fluid density in the right inguinal canal. Significance of
this finding is not clear. This may suggest fluid within right
inguinal hernia related to right inguinal hernia or anasarca or
right hydrocele extending to the inguinal canal.

There are slightly enlarged lymph nodes in both inguinal regions
which appear more prominent, possibly suggesting reactive
hyperplasia.

Other findings as described in the body of the report.

## 2022-01-26 MED ORDER — IOHEXOL 9 MG/ML PO SOLN
ORAL | Status: AC
  Filled 2022-01-26: qty 1000

## 2022-01-26 MED ORDER — STERILE WATER FOR INJECTION IV SOLN
INTRAVENOUS | Status: DC
  Filled 2022-01-26 (×5): qty 1000

## 2022-01-26 MED ORDER — CLONIDINE HCL 0.3 MG PO TABS
0.3000 mg | ORAL_TABLET | Freq: Three times a day (TID) | ORAL | Status: DC
  Administered 2022-01-26 – 2022-02-02 (×22): 0.3 mg via ORAL
  Filled 2022-01-26 (×22): qty 1

## 2022-01-26 MED ORDER — GERHARDT'S BUTT CREAM
TOPICAL_CREAM | Freq: Two times a day (BID) | CUTANEOUS | Status: DC
  Administered 2022-01-26: 1 via TOPICAL
  Filled 2022-01-26 (×2): qty 1

## 2022-01-26 MED ORDER — SERTRALINE HCL 50 MG PO TABS
25.0000 mg | ORAL_TABLET | Freq: Every day | ORAL | Status: DC
  Administered 2022-01-26 – 2022-02-02 (×8): 25 mg via ORAL
  Filled 2022-01-26 (×8): qty 1

## 2022-01-26 MED ORDER — POTASSIUM CHLORIDE CRYS ER 20 MEQ PO TBCR
40.0000 meq | EXTENDED_RELEASE_TABLET | ORAL | Status: AC
  Administered 2022-01-26 (×3): 40 meq via ORAL
  Filled 2022-01-26 (×3): qty 2

## 2022-01-26 MED ORDER — VANCOMYCIN HCL 125 MG PO CAPS
125.0000 mg | ORAL_CAPSULE | Freq: Four times a day (QID) | ORAL | Status: DC
  Administered 2022-01-26 – 2022-02-02 (×30): 125 mg via ORAL
  Filled 2022-01-26 (×30): qty 1

## 2022-01-26 MED ORDER — HEPARIN SODIUM (PORCINE) 5000 UNIT/ML IJ SOLN
5000.0000 [IU] | Freq: Three times a day (TID) | INTRAMUSCULAR | Status: DC
  Administered 2022-01-26 – 2022-02-02 (×18): 5000 [IU] via SUBCUTANEOUS
  Filled 2022-01-26 (×22): qty 1

## 2022-01-26 NOTE — ED Notes (Signed)
Breakfast order placed ?

## 2022-01-26 NOTE — Evaluation (Signed)
Occupational Therapy Evaluation ?Patient Details ?Name: Manuel Kramer ?MRN: [id number] ?DOB: January 10, 1956 ?Today's Date: 01/26/2022 ? ? ?History of Present Illness 66 y.o. M admitted on 01/25/22 due to confusion and BLE edema. Found to have C-diff and Acute Renal failure. PMH significant for DM2, HTN, CKD, CVA, depression/anxiety.  ? ?Clinical Impression ?  ?Pt admitted for concerns listed above. PTA pt reported that he was independent with functional mobility, using a RW only when needed, and he was getting assist with bathing at [facility]. At this time, pt presents with increased weakness and balance deficits. He is requiring a RW to maintain balance, as well as mod A to power up to standing. Once in standing, pt is able to complete BADL's with min guard to min A. Recommending pt return to SNF with some therapy to assist pt in returning to PLOF. OT will continue to follow acutely.   ?   ? ?Recommendations for follow up therapy are one component of a multi-disciplinary discharge planning process, led by the attending physician.  Recommendations may be updated based on patient status, additional functional criteria and insurance authorization.  ? ?Follow Up Recommendations ? Skilled nursing-short term rehab (<3 hours/day)  ?  ?Assistance Recommended at Discharge Intermittent Supervision/Assistance  ?Patient can return home with the following A little help with walking and/or transfers;A little help with bathing/dressing/bathroom ? ?  ?Functional Status Assessment ? Patient has had a recent decline in their functional status and demonstrates the ability to make significant improvements in function in a reasonable and predictable amount of time.  ?Equipment Recommendations ? None recommended by OT  ?  ?Recommendations for Other Services   ? ? ?  ?Precautions / Restrictions Precautions ?Precautions: Fall ?Restrictions ?Weight Bearing Restrictions: No  ? ?  ? ?Mobility Bed Mobility ?Overal bed mobility: Needs  Assistance ?Bed Mobility: Supine to Sit, Sit to Supine ?  ?  ?Supine to sit: Min assist ?Sit to supine: Min guard ?  ?General bed mobility comments: Min A to pull up to sitting EOB, min guard toreturn to supine ?  ? ?Transfers ?Overall transfer level: Needs assistance ?Equipment used: Rolling walker (2 wheels) ?Transfers: Sit to/from Stand ?Sit to Stand: Mod assist ?  ?  ?  ?  ?  ?General transfer comment: Mod A from bed and toilet in bathroom ?  ? ?  ?Balance Overall balance assessment: Needs assistance ?Sitting-balance support: No upper extremity supported, Feet supported ?Sitting balance-Leahy Scale: Good ?  ?  ?Standing balance support: Bilateral upper extremity supported, Reliant on assistive device for balance ?Standing balance-Leahy Scale: Poor ?  ?  ?  ?  ?  ?  ?  ?  ?  ?  ?  ?  ?   ? ?ADL either performed or assessed with clinical judgement  ? ?ADL Overall ADL's : Needs assistance/impaired ?Eating/Feeding: Independent;Sitting ?  ?Grooming: Min guard;Standing ?  ?Upper Body Bathing: Min guard;Sitting ?  ?Lower Body Bathing: Minimal assistance;Sit to/from stand;Sitting/lateral leans ?  ?Upper Body Dressing : Supervision/safety;Sitting ?  ?Lower Body Dressing: Minimal assistance;Sitting/lateral leans;Sit to/from stand ?  ?Toilet Transfer: Minimal assistance;Ambulation ?  ?Toileting- Clothing Manipulation and Hygiene: Min guard;Sitting/lateral lean;Sit to/from stand ?  ?  ?  ?Functional mobility during ADLs: Minimal assistance;Rolling walker (2 wheels) ?General ADL Comments: Pt presents with weakness and balance deficits, requiring min gaurd to min A for BADL's.  ? ? ? ?Vision Baseline Vision/History: 0 No visual deficits ?Ability to See in Adequate Light: 0 Adequate ?Patient Visual  Report: No change from baseline ?Vision Assessment?: No apparent visual deficits  ?   ?Perception   ?  ?Praxis   ?  ? ?Pertinent Vitals/Pain Pain Assessment ?Pain Assessment: No/denies pain  ? ? ? ?Hand Dominance Right ?   ?Extremity/Trunk Assessment Upper Extremity Assessment ?Upper Extremity Assessment: Overall WFL for tasks assessed ?  ?Lower Extremity Assessment ?Lower Extremity Assessment: Defer to PT evaluation ?  ?Cervical / Trunk Assessment ?Cervical / Trunk Assessment: Normal ?  ?Communication Communication ?Communication: No difficulties ?  ?Cognition Arousal/Alertness: Awake/alert ?Behavior During Therapy: [facility] for tasks assessed/performed ?Overall Cognitive Status: No family/caregiver present to determine baseline cognitive functioning ?  ?  ?  ?  ?  ?  ?  ?  ?  ?  ?  ?  ?  ?  ?  ?  ?General Comments: Follows all simple commands, O&A x4 ?  ?  ?General Comments  VSS on RA ? ?  ?Exercises   ?  ?Shoulder Instructions    ? ? ?Home Living Family/patient expects to be discharged to:: Skilled nursing facility ?  ?Available Help at Discharge: [place] ?Type of Home: Apartment ?  ?  ?  ?  ?  ?  ?  ?  ?  ?  ?  ?  ?  ?Additional Comments: LTC at [name] ? Lives With: Other (Comment) (SNF) ? ?  ?Prior Functioning/Environment Prior Level of Function : Needs assist ?  ?  ?  ?  ?  ?  ?Mobility Comments: Supervision for mobility ?ADLs Comments: Assist with IADL's and bathing ?  ? ?  ?  ?OT Problem List: Decreased strength;Decreased activity tolerance;Impaired balance (sitting and/or standing);Decreased safety awareness;Decreased knowledge of use of DME or AE ?  ?   ?OT Treatment/Interventions: Self-care/ADL training;Therapeutic exercise;Energy conservation;DME and/or AE instruction;Therapeutic activities;Patient/family education;Balance training  ?  ?OT Goals(Current goals can be found in the care plan section) Acute Rehab OT Goals ?Patient Stated Goal: To not go back to SNF ?OT Goal Formulation: With patient ?Time For Goal Achievement: 02/09/22 ?Potential to Achieve Goals: Good ?ADL Goals ?Additional ADL Goal #1: Pt will complete all toileting tasks at mod I level, using RW for safety. ?Additional ADL Goal #2: Pt will  tolerate 5 mins of standing ADL tasks to improve activity tolerance. ?Additional ADL Goal #3: Pt will complete a multistep pathfinding task with supervision.  ?OT Frequency: Min 2X/week ?  ? ?Co-evaluation   ?  ?  ?  ?  ? ?  ?AM-PAC OT "6 Clicks" Daily Activity     ?Outcome Measure Help from another person eating meals?: None ?Help from another person taking care of personal grooming?: A Little ?Help from another person toileting, which includes using toliet, bedpan, or urinal?: A Little ?Help from another person bathing (including washing, rinsing, drying)?: A Little ?Help from another person to put on and taking off regular upper body clothing?: A Little ?Help from another person to put on and taking off regular lower body clothing?: A Little ?6 Click Score: 19 ?  ?End of Session Equipment Utilized During Treatment: Rolling walker (2 wheels) ?Nurse Communication: Mobility status ? ?Activity Tolerance: Patient tolerated treatment well ?Patient left: in bed;with call bell/phone within reach;with bed alarm set ? ?OT Visit Diagnosis: Unsteadiness on feet (R26.81);Other abnormalities of gait and mobility (R26.89);Muscle weakness (generalized) (M62.81)  ?              ?Time: [id number] ?OT Time Calculation (min): 43 min ?Charges:  OT  General Charges ?$OT Visit: 1 Visit ?OT Evaluation ?$OT Eval Moderate Complexity: 1 Mod ?OT Treatments ?$Self Care/Home Management : 23-37 mins ? ?Rayford Williamsen H., OTR/L ?Acute Rehabilitation ? ?Clio Gerhart Elane Yolanda Bonine ?01/26/2022, 5:38 PM ?

## 2022-01-26 NOTE — TOC Progression Note (Signed)
Transition of Care (TOC) - Initial/Assessment Note  ? ? ?Patient Details  ?Name: Manuel Kramer ?MRN: [id number] ?Date of Birth: 12-02-1955 ? ?Transition of Care (TOC) CM/SW Contact:    ?Paulene Floor Bessie Boyte, LCSWA ?Phone Number: ?01/26/2022, 3:58 PM ? ?Clinical Narrative:                 ?CSW contacted [name] with admissions at [facility] the patient is LT at the facility, but will need and FL2 and insurance auth prior to readmission.   ? ?Pending: PT and OT recommendations to complete FL2 ? ?TOC will continue to follow and follow up with the facility when patient is closer to being medically ready.   ?  ?  ? ? ?Patient Goals and CMS Choice ?  ?  ?  ? ?Expected Discharge Plan and Services ?  ?  ?  ?  ?  ?                ?  ?  ?  ?  ?  ?  ?  ?  ?  ?  ? ?Prior Living Arrangements/Services ?  ?  ?  ?       ?  ?  ?  ?  ? ?Activities of Daily Living ?  ?  ? ?Permission Sought/Granted ?  ?  ?   ?   ?   ?   ? ?Emotional Assessment ?  ?  ?  ?  ?  ?  ? ?Admission diagnosis:  AKI (acute kidney injury) ([place]) [N17.9] ?Acute renal failure superimposed on stage 3b chronic kidney disease ([place]) [N17.9, N18.32] ?Patient Active Problem List  ? Diagnosis Date Noted  ? C. difficile colitis 01/26/2022  ? Hypokalemia 01/26/2022  ? Acute renal failure superimposed on stage 3b chronic kidney disease ([place]) 01/25/2022  ? Bilateral lower extremity edema 01/25/2022  ? Type 2 diabetes mellitus with complication, with long-term current use of insulin ([place]) 01/25/2022  ? Depression with anxiety 01/25/2022  ? Chronic diarrhea 01/25/2022  ? Physical deconditioning 01/25/2022  ? Left hip pain 01/25/2022  ? Anemia of chronic disease 01/25/2022  ? Chronic diastolic CHF (congestive heart failure) ([place]) 01/25/2022  ? Cerebral embolism with cerebral infarction 11/17/2021  ? Protein-calorie malnutrition, severe 11/16/2021  ? Acute encephalopathy 11/14/2021  ? AKI (acute kidney injury) ([place])   ? Hyperglycemia   ? Hypertensive emergency without congestive heart  failure   ? Sepsis ([place])   ? Hyperlipidemia 05/22/2016  ? Diabetic neuropathy ([place]) 05/22/2016  ? Other specified diabetes mellitus without complications ([place]) [id number]  ? Essential hypertension 10/02/2014  ? Smoking 10/02/2014  ? Family history of prostate cancer 10/02/2014  ? Renal insufficiency 10/02/2014  ? ?PCP:  Vonna Drafts, FNP ?Pharmacy:   ?[facility] [employer], [address] ?[place] [address] ?Phone: [phone] Fax: [phone] ? ?[place], [place] - [address] ?[address] ?[place] [address] ?Phone: [phone] Fax: [phone] ? ?[id number] - [place], [place] [address] ?[place] [address] ?Phone: [phone] Fax: [phone] ? ? ? ? ?Social Determinants of Health (SDOH) Interventions ?  ? ?Readmission Risk Interventions ?   ? View : No data to display.  ?  ?  ?  ? ? ? ?

## 2022-01-26 NOTE — Evaluation (Signed)
Speech Language Pathology Evaluation ?Patient Details ?Name: Manuel Kramer ?MRN: [id number] ?DOB: 31-Oct-1955 ?Today's Date: 01/26/2022 ?Time: [id number] ?SLP Time Calculation (min) (ACUTE ONLY): 20 min ? ?Problem List:  ?Patient Active Problem List  ? Diagnosis Date Noted  ? C. difficile colitis 01/26/2022  ? Hypokalemia 01/26/2022  ? Acute renal failure superimposed on stage 3b chronic kidney disease ([place]) 01/25/2022  ? Bilateral lower extremity edema 01/25/2022  ? Type 2 diabetes mellitus with complication, with long-term current use of insulin ([place]) 01/25/2022  ? Depression with anxiety 01/25/2022  ? Chronic diarrhea 01/25/2022  ? Physical deconditioning 01/25/2022  ? Left hip pain 01/25/2022  ? Anemia of chronic disease 01/25/2022  ? Chronic diastolic CHF (congestive heart failure) ([place]) 01/25/2022  ? Cerebral embolism with cerebral infarction 11/17/2021  ? Protein-calorie malnutrition, severe 11/16/2021  ? Acute encephalopathy 11/14/2021  ? AKI (acute kidney injury) ([place])   ? Hyperglycemia   ? Hypertensive emergency without congestive heart failure   ? Sepsis ([place])   ? Hyperlipidemia 05/22/2016  ? Diabetic neuropathy ([place]) 05/22/2016  ? Other specified diabetes mellitus without complications ([place]) [id number]  ? Essential hypertension 10/02/2014  ? Smoking 10/02/2014  ? Family history of prostate cancer 10/02/2014  ? Renal insufficiency 10/02/2014  ? ?Past Medical History:  ?Past Medical History:  ?Diagnosis Date  ? Chronic diarrhea   ? Diabetes mellitus without complication ([place])   ? Hypertension   ? ?Past Surgical History:  ?Past Surgical History:  ?Procedure Laterality Date  ? COLOSCOPY     ? POLYP  ? ?HPI:  ?Patient is a 66 y.o. 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 He presented from SNF to [facility] ED with confusion, worsening renal function and recent fall day prior. In ED, afebrile, oxygen  saturations 100% on RA, CT head negative for acute findings.  ? ?Assessment / Plan / Recommendation ?Clinical Impression ? Patient is presenting with mod-severe cognitive impairment as per this speech-language cognitive evaluation. No family present at time of evaluation but per review of SLP notes when patient admitted following CVA in January of 2023, suspect patient is at or near baseline cognitive function. Following prior CVA, SLP noted patient with significant deficits in short term memory, delayed recall and even some long term memory deficits, poor carryover of strategies as well as maxA cues for awareness to deficits. During today's evaluation, patient was oriented to name, DOB, state but not able to tell how old he was, not oriented to month, date, year, oriented to being in the hospital but not consistently oriented to city. He was able to state reason for being here was "I fell in my room and they sent me here". Patient not able to demonstrate any delayed recall with 5 words, named 3 animals in 60 seconds and then started to decline further participation in standardized testing "my brain is shot". Patient unable to recall items he ate on meal tray which was covered up and sitting in front of him. As SLP suspects patient is not far off baseline cognitive function established after CVA this past January, SLP is not recommending acute level therapy but is recommending SNF level SLP intervention when ready for discharge. ?   ?SLP Assessment ? SLP Recommendation/Assessment: All further Speech Lanaguage Pathology  needs can be addressed in the next venue of care ?SLP Visit Diagnosis: Cognitive communication deficit (R41.841)  ?  ?Recommendations for follow up therapy are one component  of a multi-disciplinary discharge planning process, led by the attending physician.  Recommendations may be updated based on patient status, additional functional criteria and insurance authorization. ?   ?Follow Up  Recommendations ? Skilled nursing-short term rehab (<3 hours/day)  ?  ?Assistance Recommended at Discharge ? Frequent or constant Supervision/Assistance  ?Functional Status Assessment Patient has had a recent decline in their functional status and demonstrates the ability to make significant improvements in function in a reasonable and predictable amount of time.  ?Frequency and Duration    ?  ?  ?   ?SLP Evaluation ?Cognition ? Overall Cognitive Status: No family/caregiver present to determine baseline cognitive functioning ?Arousal/Alertness: Awake/alert ?Orientation Level: Oriented to person;Disoriented to time;Disoriented to situation;Disoriented to place ?Year: Other (Comment) ("1994") ?Month: August ?Day of Week: Incorrect ?Attention: Sustained ?Sustained Attention: Impaired ?Sustained Attention Impairment: Verbal basic;Functional basic ?Memory: Impaired ?Memory Impairment: Storage deficit;Retrieval deficit;Decreased recall of new information ?Awareness: Impaired ?Awareness Impairment: Intellectual impairment;Emergent impairment ?Problem Solving: Impaired ?Problem Solving Impairment: Verbal basic;Verbal complex;Functional basic ?Behaviors: Other (comment) (patient not fully engaged) ?Safety/Judgment: Impaired ?Comments: Patient not demonstrating any awareness to his deficits and stating he plans to go home rather than back to the nursing home  ?  ?   ?Comprehension ? Auditory Comprehension ?Overall Auditory Comprehension: Other (comment) (WFL at basic level)  ?  ?Expression Expression ?Primary Mode of Expression: Verbal ?Verbal Expression ?Initiation: No impairment ?Level of Generative/Spontaneous Verbalization: Phrase;Sentence ?Naming: Impairment ?Divergent: 50-74% accurate ?Interfering Components: Attention ?Non-Verbal Means of Communication: Not applicable   ?Oral / Motor ? Oral Motor/Sensory Function ?Overall Oral Motor/Sensory Function: Within functional limits ?Motor Speech ?Overall Motor Speech: Appears  within functional limits for tasks assessed ?Resonance: Within functional limits ?Articulation: Within functional limitis ?Intelligibility: Intelligible ?Motor Planning: Witnin functional limits ?Motor Speech Errors: Not applicable   ?        ? ?Sonia Baller, MA, CCC-SLP ?Speech Therapy ? ? ?

## 2022-01-26 NOTE — Assessment & Plan Note (Addendum)
C. difficile PCR positive. ?--BM x 9 past past 24 hours (13 day prior) ?--Vancomycin 125 mg PO QID x 10 days ?--Enteric precautions ?

## 2022-01-26 NOTE — Progress Notes (Signed)
PT Cancellation Note ? ?Patient Details ?Name: Manuel Kramer ?MRN: [id number] ?DOB: 02-Mar-1956 ? ? ?Cancelled Treatment:    Reason Eval/Treat Not Completed: Other (comment).  PT made two attempts during which pt declined to work with PT.  Retry at another time. ? ? ?Ivar Drape ?01/26/2022, 2:04 PM ? ?Samul Dada, PT PhD ?Acute Rehab Dept. Number: [facility] [phone] and [facility] [phone] ? ?

## 2022-01-26 NOTE — Progress Notes (Signed)
VASCULAR LAB ? ? ? ?Bilateral lower extremity venous duplex has been performed. ? ?See CV proc for preliminary results. ? ? ?Elmo Shumard, RVT ?01/26/2022, 9:17 AM ? ?

## 2022-01-26 NOTE — Progress Notes (Signed)
?PROGRESS NOTE ? ? ? ?Manuel Kramer  [id number] DOB: June 28, 1956 DOA: 01/25/2022 ?PCP: Manuel Drafts, FNP  ? ? ?Brief Narrative:  ?Manuel Kramer is a 66 year old male with past medical history significant for type 2 diabetes mellitus, essential hypertension, CKD stage IIIb, anemia of chronic kidney disease, depression/anxiety, chronic diarrhea, Hx ischemic CVA, with questionable cognitive impairment who presented to [facility] ED via EMS from [facility] SNF with confusion, worsening renal function and recent fall day prior.  Given patient is a poor historian with confusion, spouse at bedside contributes to the history; although she has very poor knowledge of his medical conditions and has not seen him regularly at the SNF.  Patient reports worsening lower extremity edema over the last 2 weeks and apparently nursing facility has increased his Lasix without improvement.  Labs from SNF with creatinine 2.6, baseline of 1.84 on 12/05/2021.  Also reports left hip pain and nursing home noted fall yesterday.  No other specific complaints or concerns at this time.  Denies headache, no dizziness, no chest pain, no palpitations, no shortness of breath, no cough/congestion, no nausea/vomiting, no abdominal pain, no weakness, no fatigue, no paresthesias.   ? ?In the ED, temperature 98.4 ?F, HR 74, RR 17, BP 201/99, SPO2 100% on room air.  WBC 15.9, hemoglobin 11.0, platelets 192.  Sodium 138, potassium 3.1, chloride 112, CO2 17, BUN 35, creatinine 3.18, glucose 106.  Chest x-ray with cardiomegaly with central vascular prominence without overt pulmonary edema or focal airspace consolidation.  EDP consulted [facility] for admission for acute on chronic renal failure. ?  ? ? ?Assessment & Plan: ?  ?Assessment and Plan: ?* Acute renal failure superimposed on stage 3b chronic kidney disease ([place]) ?Patient presenting from SNF with elevated creatinine at facility of 2.6; and on presentation to the ED now up to 3.18.  Apparently SNF has  been increasing his Lasix due to progressive lower extremity edema.  Also patient's blood pressure very poorly controlled with poorly controlled diabetes likely contributing factor to progressive renal failure and volume loss with positive C. difficile colitis.  Renal ultrasound with no acute findings. ?--Urinalysis with urine creatinine/sodium/urea: Pending ?--Sodium bicarbonate drip 75 mL/h  ?--Avoid nephrotoxins, renally dose all medications ?--Strict I's and O's and daily weights ?-- BMP daily ? ?Acute encephalopathy ?Patient with confusion, unable to discern who the president is or what the year is.  Per spouse at bedside, has been unable to answer these questions correctly for some time.  Patient with history of ischemic CVA, unclear if patient with cognitive impairment versus developing dementia.  Patient is afebrile, no concerning consolidation on chest x-ray, unlikely infectious etiology.  BUN only 35.  CT head without contrast negative for acute findings. ?--SLP for cognitive evaluation ?--Supportive care ? ?Bilateral lower extremity edema ?Patient with progressive lower extremity edema bilaterally.  Apparently nursing home has been increasing Lasix without improvement and now with worsening renal failure.  Chest x-ray with no overt pulmonary edema.  D-dimer elevated 2.04.  Vascular duplex ultrasound bilateral lower extremities negative for DVT, although limited study; but notable for enlarged lymph nodes in the groin. ?--Check CT abdomen/pelvis without contrast; lymphadenopathy secondary to infectious with C. difficile colitis versus lymphoma causing vascular compression causing edema. ? ?C. difficile colitis ?C. difficile PCR positive. ?--Vancomycin 125 mg PO QID x 10 days ?--Enteric precautions ? ?Left hip pain ?Nursing home noted fall yesterday.  Patient complaining of left hip pain with active/passive range of motion.  Left hip/pelvis x-ray with  no acute osseous abnormality. ?--PT/OT eval ? ?Type 2  diabetes mellitus with complication, with long-term current use of insulin ([place]) ?Hemoglobin A1c 5.2, well controlled.  Patient is prescribed Lantus 45 units [place] daily and Glyxambi, but review of SNF records appears that he has not been receiving insulin.  Hemoglobin A1c 5.2, well controlled. ?--Hold long-acting insulin for now ?--SSI for coverage ?--CBG before every meal/at bedtime ? ?Essential hypertension ?BP 201/99; uncontrolled.  On amlodipine 10 mg p.o. daily, carvedilol 25 mg p.o. twice daily, hydralazine 100 mg p.o. 3 times daily, clonidine 0.3mg  PO TID and spironolactone outpatient. ?--Holding spironolactone and furosemide due to AKI as above ?--Amlodipine 10 mg p.o. daily ?--Carvedilol 25 g p.o. BID ?--Hydralazine 100 mg p.o. TID ?--Clonidine 0.3 mg p.o. TID ?--Labetalol 10 mg IV every 4 hours PRN SBP >180 or DBP >110 ? ?Depression with anxiety ?--Continue sertraline 25 mg p.o. daily ? ?Chronic diarrhea ?GI PCR negative January 2023.  Patient is afebrile ?-- C. difficile positive as above on oral vancomycin ?-- GI PCR: Pending ? ?Hyperlipidemia ?--Continue atorvastatin 40 mg p.o. daily ? ?Anemia of chronic disease ?Hemoglobin 10.5, stable. ? ?Chronic diastolic CHF (congestive heart failure) ([place]) ?TTE 11/18/2021 with LVEF 60-65%, no regional wall motion normalities, grade 1 diastolic dysfunction, IVC normal.  Chest x-ray with no overt pulmonary edema. ?--Holding home Lasix due to worsening renal failure ?--Strict I's and O's and daily weights ? ?Physical deconditioning ?Patient has been at [facility] for several months. ?--PT/OT evaluation ?--TOC for assistance with discharge planning ? ? ? ?DVT prophylaxis:   Heparin ? ?  Code Status: Full Code ?Family Communication: No family present at bedside this morning ? ?Disposition Plan:  ?Level of care: Med-Surg ?Status is: Inpatient ?Remains inpatient appropriate because: Continues on IV fluid hydration, pending CT abdomen/pelvis, starting on oral vancomycin for  positive C. difficile colitis ?  ? ?Consultants:  ?None ? ?Procedures:  ?Renal ultrasound ? ?Antimicrobials:  ?Oral vancomycin 3/30>> ? ? ?Subjective: ?Patient seen examined at bedside, resting comfortably.  Sleeping but easily arousable.  Remains in ED holding area.  C. difficile PCR returned back positive and starting oral vancomycin.  Mild improvement in his renal function to 3.09 this morning.  No DVT noted on duplex ultrasound lower extremities but notable for enlarged lymph nodes.  No other specific questions or concerns at this time.  Denies headache, no chest pain, palpitations, no shortness of breath, no abdominal pain, no fever/chills/night sweats, no nausea/vomiting, no cough/congestion, no paresthesias.  No acute events overnight per nursing staff. ? ?Objective: ?Vitals:  ? 01/26/22 0500 01/26/22 0600 01/26/22 0800 01/26/22 0957  ?BP: (!) 183/92 (!) 183/90 (!) 193/94 (!) 179/91  ?Pulse: 76 71 74 75  ?Resp: 20 20 16 20   ?Temp:   98.1 ?F (36.7 ?C) 97.8 ?F (36.6 ?C)  ?TempSrc:   Oral Oral  ?SpO2: 100% 100% 100% 100%  ? ?No intake or output data in the 24 hours ending 01/26/22 1256 ?There were no vitals filed for this visit. ? ?Examination: ? ?Physical Exam: ?GEN: NAD, alert and oriented to place [facility]), but not time, person, or situation; chronically ill in appearance, appears older than stated age ?HEENT: NCAT, PERRL, EOMI, sclera clear, MMM ?PULM: CTAB w/o wheezes/crackles, normal respiratory effort, on room air ?CV: RRR w/o M/G/R ?GI: abd soft, NTND, NABS, no R/G/M ?MSK: 2+ pitting edema bilateral lower extremities, muscle strength globally intact 5/5 bilateral upper/lower extremities ?NEURO: CN II-XII intact, no focal deficits, sensation to light touch intact ?PSYCH:  normal mood/affect ?Integumentary: dry/intact, no rashes or wounds ? ? ? ?Data Reviewed: I have personally reviewed following labs and imaging studies ? ?CBC: ?Recent Labs  ?Lab 01/25/22 ?1528 01/26/22 ?0432  ?WBC 15.9* 14.5*   ?NEUTROABS 13.2*  --   ?HGB 11.0* 10.5*  ?HCT 33.1* 31.5*  ?MCV 96.2 95.7  ?PLT 192 181  ? ?Basic Metabolic Panel: ?Recent Labs  ?Lab 01/25/22 ?1528 01/25/22 ?1722 01/26/22 ?0432  ?NA 138  --  138  ?K 3.1*

## 2022-01-26 NOTE — Assessment & Plan Note (Addendum)
Potassium 3.8 today ?--Repeat electrolytes in a.m. ?

## 2022-01-27 DIAGNOSIS — D638 Anemia in other chronic diseases classified elsewhere: Secondary | ICD-10-CM | POA: Diagnosis not present

## 2022-01-27 DIAGNOSIS — R600 Localized edema: Secondary | ICD-10-CM | POA: Diagnosis not present

## 2022-01-27 DIAGNOSIS — G9340 Encephalopathy, unspecified: Secondary | ICD-10-CM | POA: Diagnosis not present

## 2022-01-27 DIAGNOSIS — N179 Acute kidney failure, unspecified: Secondary | ICD-10-CM | POA: Diagnosis not present

## 2022-01-27 LAB — BASIC METABOLIC PANEL
Anion gap: 9 (ref 5–15)
BUN: 29 mg/dL — ABNORMAL HIGH (ref 8–23)
CO2: 20 mmol/L — ABNORMAL LOW (ref 22–32)
Calcium: 7.9 mg/dL — ABNORMAL LOW (ref 8.9–10.3)
Chloride: 108 mmol/L (ref 98–111)
Creatinine, Ser: 3.05 mg/dL — ABNORMAL HIGH (ref 0.61–1.24)
GFR, Estimated: 22 mL/min — ABNORMAL LOW (ref >60)
Glucose, Bld: 150 mg/dL — ABNORMAL HIGH (ref 70–99)
Potassium: 3.4 mmol/L — ABNORMAL LOW (ref 3.5–5.1)
Sodium: 137 mmol/L (ref 135–145)

## 2022-01-27 LAB — GLUCOSE, CAPILLARY
Glucose-Capillary: 162 mg/dL — ABNORMAL HIGH (ref 70–99)
Glucose-Capillary: 137 mg/dL — ABNORMAL HIGH (ref 70–99)
Glucose-Capillary: 170 mg/dL — ABNORMAL HIGH (ref 70–99)
Glucose-Capillary: 176 mg/dL — ABNORMAL HIGH (ref 70–99)

## 2022-01-27 LAB — CBC
HCT: 30 % — ABNORMAL LOW (ref 39.0–52.0)
Hemoglobin: 10 g/dL — ABNORMAL LOW (ref 13.0–17.0)
MCH: 31.1 pg (ref 26.0–34.0)
MCHC: 33.3 g/dL (ref 30.0–36.0)
MCV: 93.2 fL (ref 80.0–100.0)
Platelets: 183 10*3/uL (ref 150–400)
RBC: 3.22 MIL/uL — ABNORMAL LOW (ref 4.22–5.81)
RDW: 13.6 % (ref 11.5–15.5)
WBC: 10.6 10*3/uL — ABNORMAL HIGH (ref 4.0–10.5)
nRBC: 0 % (ref 0.0–0.2)

## 2022-01-27 LAB — UREA NITROGEN, URINE: Urea Nitrogen, Ur: 430 mg/dL

## 2022-01-27 LAB — URINE CULTURE

## 2022-01-27 LAB — MAGNESIUM: Magnesium: 2.1 mg/dL (ref 1.7–2.4)

## 2022-01-27 MED ORDER — POTASSIUM CHLORIDE CRYS ER 20 MEQ PO TBCR
30.0000 meq | EXTENDED_RELEASE_TABLET | ORAL | Status: AC
  Administered 2022-01-27 (×2): 30 meq via ORAL
  Filled 2022-01-27 (×2): qty 1

## 2022-01-27 NOTE — Evaluation (Signed)
Physical Therapy Evaluation ?Patient Details ?Name: Manuel Kramer ?MRN: [id number] ?DOB: 14-Feb-1956 ?Today's Date: 01/27/2022 ? ?History of Present Illness ? 66 y.o. M admitted on 01/25/22 due to confusion and BLE edema. Found to have C-diff and Acute Renal failure. PMH significant for DM2, HTN, CKD, CVA, depression/anxiety. ?  ?Clinical Impression ? Pt admitted with above diagnosis. PTA pt resided in LTC at [facility], supervision mobility with RW.  Pt currently with functional limitations due to the deficits listed below (see PT Problem List). On eval, pt required min assist bed mobility, mod assist sit to stand, and min assist ambulation 25' with RW. Pt will benefit from skilled PT to increase their independence and safety with mobility to allow discharge to the venue listed below.   ?   ?   ? ?Recommendations for follow up therapy are one component of a multi-disciplinary discharge planning process, led by the attending physician.  Recommendations may be updated based on patient status, additional functional criteria and insurance authorization. ? ?Follow Up Recommendations Skilled nursing-short term rehab (<3 hours/day) ? ?  ?Assistance Recommended at Discharge Frequent or constant Supervision/Assistance  ?Patient can return home with the following ?   ? ?  ?Equipment Recommendations None recommended by PT  ?Recommendations for Other Services ?    ?  ?Functional Status Assessment Patient has had a recent decline in their functional status and demonstrates the ability to make significant improvements in function in a reasonable and predictable amount of time.  ? ?  ?Precautions / Restrictions Precautions ?Precautions: Fall  ? ?  ? ?Mobility ? Bed Mobility ?Overal bed mobility: Needs Assistance ?Bed Mobility: Supine to Sit ?  ?  ?Supine to sit: Min assist, HOB elevated ?  ?  ?General bed mobility comments: +rail, increased time, assist to elevate trunk ?  ? ?Transfers ?Overall transfer level: Needs  assistance ?Equipment used: Rolling walker (2 wheels) ?Transfers: Sit to/from Stand ?Sit to Stand: Mod assist, From elevated surface ?  ?  ?  ?  ?  ?General transfer comment: cues for hand placement and sequencing, assist to power up ?  ? ?Ambulation/Gait ?Ambulation/Gait assistance: Min assist ?Gait Distance (Feet): 25 Feet ?Assistive device: Rolling walker (2 wheels) ?Gait Pattern/deviations: Step-through pattern, Decreased stride length ?Gait velocity: decreased ?Gait velocity interpretation: <1.31 ft/sec, indicative of household ambulator ?  ?General Gait Details: cues to stay close to RW, assist to maintain balance ? ?Stairs ?  ?  ?  ?  ?  ? ?Wheelchair Mobility ?  ? ?Modified Rankin (Stroke Patients Only) ?  ? ?  ? ?Balance Overall balance assessment: Needs assistance ?Sitting-balance support: No upper extremity supported, Feet supported ?Sitting balance-Leahy Scale: Good ?  ?  ?Standing balance support: Bilateral upper extremity supported, Reliant on assistive device for balance, During functional activity ?Standing balance-Leahy Scale: Poor ?  ?  ?  ?  ?  ?  ?  ?  ?  ?  ?  ?  ?   ? ? ? ?Pertinent Vitals/Pain Pain Assessment ?Pain Assessment: Faces ?Faces Pain Scale: Hurts little more ?Pain Location: bottom from diarrhea ?Pain Descriptors / Indicators: Discomfort, Sore, Tender ?Pain Intervention(s): Monitored during session, Repositioned  ? ? ?Home Living Family/patient expects to be discharged to:: Skilled nursing facility ?  ?Available Help at Discharge: Skilled Nursing Facility ?  ?  ?  ?  ?  ?  ?  ?Additional Comments: LTC at [name]  ?  ?Prior Function Prior Level of Function : Needs assist ?  ?  ?  ?  ?  ?  ?  Mobility Comments: Supervision for mobility ?ADLs Comments: Assist with IADL's and bathing ?  ? ? ?Hand Dominance  ? Dominant Hand: Right ? ?  ?Extremity/Trunk Assessment  ? Upper Extremity Assessment ?Upper Extremity Assessment: Overall WFL for tasks assessed ?  ? ?Lower Extremity  Assessment ?Lower Extremity Assessment: Generalized weakness (edema noted) ?  ? ?Cervical / Trunk Assessment ?Cervical / Trunk Assessment: Kyphotic  ?Communication  ? Communication: No difficulties  ?Cognition Arousal/Alertness: Awake/alert ?Behavior During Therapy: [facility] for tasks assessed/performed ?Overall Cognitive Status: No family/caregiver present to determine baseline cognitive functioning ?  ?  ?  ?  ?  ?  ?  ?  ?  ?  ?  ?  ?  ?  ?  ?  ?General Comments: Follows all simple commands, O&A x4 ?  ?  ? ?  ?General Comments General comments (skin integrity, edema, etc.): VSS on RA ? ?  ?Exercises    ? ?Assessment/Plan  ?  ?PT Assessment Patient needs continued PT services  ?PT Problem List Decreased mobility;Decreased activity tolerance;Pain;Decreased balance ? ?   ?  ?PT Treatment Interventions Therapeutic activities;Gait training;Therapeutic exercise;Patient/family education;Balance training;Functional mobility training   ? ?PT Goals (Current goals can be found in the Care Plan section)  ?Acute Rehab PT Goals ?Patient Stated Goal: home ?PT Goal Formulation: With patient ?Time For Goal Achievement: 02/10/22 ?Potential to Achieve Goals: Good ? ?  ?Frequency Min 2X/week ?  ? ? ?Co-evaluation   ?  ?  ?  ?  ? ? ?  ?AM-PAC PT "6 Clicks" Mobility  ?Outcome Measure Help needed turning from your back to your side while in a flat bed without using bedrails?: A Little ?Help needed moving from lying on your back to sitting on the side of a flat bed without using bedrails?: A Little ?Help needed moving to and from a bed to a chair (including a wheelchair)?: A Little ?Help needed standing up from a chair using your arms (e.g., wheelchair or bedside chair)?: A Lot ?Help needed to walk in hospital room?: A Little ?Help needed climbing 3-5 steps with a railing? : Total ?6 Click Score: 15 ? ?  ?End of Session Equipment Utilized During Treatment: Gait belt ?Activity Tolerance: Patient tolerated treatment well ?Patient left: in  chair;with call bell/phone within reach;with chair alarm set ?Nurse Communication: Mobility status ?PT Visit Diagnosis: Difficulty in walking, not elsewhere classified (R26.2) ?  ? ?Time: 3762-8315 ?PT Time Calculation (min) (ACUTE ONLY): 16 min ? ? ?Charges:   PT Evaluation ?$PT Eval Moderate Complexity: 1 Mod ?  ?  ?   ? ? ?Aida Raider, PT  ?Office # [phone] ?Pager [phone] ? ? ?Ilda Foil ?01/27/2022, 10:16 AM ? ?

## 2022-01-27 NOTE — Progress Notes (Signed)
?PROGRESS NOTE ? ? ? ?Manuel Kramer  [id number] DOB: 10-15-1956 DOA: 01/25/2022 ?PCP: Vonna Drafts, FNP  ? ? ?Brief Narrative:  ?Manuel Kramer is a 66 year old male with past medical history significant for type 2 diabetes mellitus, essential hypertension, CKD stage IIIb, anemia of chronic kidney disease, depression/anxiety, chronic diarrhea, Hx ischemic CVA, with questionable cognitive impairment who presented to [facility] ED via EMS from [facility] SNF with confusion, worsening renal function and recent fall day prior.  Given patient is a poor historian with confusion, spouse at bedside contributes to the history; although she has very poor knowledge of his medical conditions and has not seen him regularly at the SNF.  Patient reports worsening lower extremity edema over the last 2 weeks and apparently nursing facility has increased his Lasix without improvement.  Labs from SNF with creatinine 2.6, baseline of 1.84 on 12/05/2021.  Also reports left hip pain and nursing home noted fall yesterday.  No other specific complaints or concerns at this time.  Denies headache, no dizziness, no chest pain, no palpitations, no shortness of breath, no cough/congestion, no nausea/vomiting, no abdominal pain, no weakness, no fatigue, no paresthesias.   ? ?In the ED, temperature 98.4 ?F, HR 74, RR 17, BP 201/99, SPO2 100% on room air.  WBC 15.9, hemoglobin 11.0, platelets 192.  Sodium 138, potassium 3.1, chloride 112, CO2 17, BUN 35, creatinine 3.18, glucose 106.  Chest x-ray with cardiomegaly with central vascular prominence without overt pulmonary edema or focal airspace consolidation.  EDP consulted [facility] for admission for acute on chronic renal failure. ?  ?  ?Assessment and Plan: ?* Acute renal failure superimposed on stage 3b chronic kidney disease ([place]) ?Patient presenting from SNF with elevated creatinine at facility of 2.6; and on presentation to the ED now up to 3.18.  Apparently SNF has been increasing his Lasix  due to progressive lower extremity edema.  Also patient's blood pressure very poorly controlled with poorly controlled diabetes likely contributing factor to progressive renal failure and volume loss with positive C. difficile colitis.  Renal ultrasound with no acute findings.  Urine sodium 22, urine creatinine 96; FeNa = 0.5%; which is consistent with prerenal. ?--Cr 3.18>3.10>3.09>3.05 ?--Urinalysis with urine creatinine/sodium/urea: Pending ?--Sodium bicarbonate drip 100 mL/h  ?--Avoid nephrotoxins, renally dose all medications ?--Strict I's and O's and daily weights ?--BMP daily ? ?Acute encephalopathy ?Patient with confusion, unable to discern who the president is or what the year is.  Per spouse at bedside, has been unable to answer these questions correctly for some time.  Patient with history of ischemic CVA, unclear if patient with cognitive impairment versus developing dementia.  Patient is afebrile, no concerning consolidation on chest x-ray, unlikely infectious etiology.  BUN only 35.  CT head without contrast negative for acute findings. ?--SLP evaluated for cognitive impairment, appears to be at baseline following recent stroke ?--Supportive care ? ?Bilateral lower extremity edema ?Patient with progressive lower extremity edema bilaterally.  Apparently nursing home has been increasing Lasix without improvement and now with worsening renal failure.  Chest x-ray with no overt pulmonary edema.  D-dimer elevated 2.04.  Vascular duplex ultrasound bilateral lower extremities negative for DVT, although limited study; but notable for enlarged lymph nodes in the groin.  CT abdomen/pelvis without contrast with diffuse wall thickening ascending and transverse colon, pericolic stranding adjacent to the ascending colon consistent with infectious colitis, no evidence of obstruction or pneumoperitoneum, no hydronephrosis, right inguinal hernia, enlarged lymph nodes inguinal regions suggestive of reactive  hyperplasia. ?--TED hose, lower extremity elevation ? ?C. difficile colitis ?C. difficile PCR positive. ?--Vancomycin 125 mg PO QID x 10 days ?--Enteric precautions ? ?Left hip pain ?Nursing home noted fall yesterday.  Patient complaining of left hip pain with active/passive range of motion.  Left hip/pelvis x-ray with no acute osseous abnormality. ?--PT/OT following, recommend return to SNF ? ?Type 2 diabetes mellitus with complication, with long-term current use of insulin ([place]) ?Hemoglobin A1c 5.2, well controlled.  Patient is prescribed Lantus 45 units [place] daily and Glyxambi, but review of SNF records appears that he has not been receiving insulin.  Hemoglobin A1c 5.2, well controlled. ?--Hold long-acting insulin for now ?--SSI for coverage ?--CBG before every meal/at bedtime ? ?Essential hypertension ?BP 201/99; uncontrolled.  On amlodipine 10 mg p.o. daily, carvedilol 25 mg p.o. twice daily, hydralazine 100 mg p.o. 3 times daily, clonidine 0.3mg  PO TID and spironolactone outpatient. ?--Holding spironolactone and furosemide due to AKI as above ?--Amlodipine 10 mg p.o. daily ?--Carvedilol 25 g p.o. BID ?--Hydralazine 100 mg p.o. TID ?--Clonidine 0.3 mg p.o. TID ?--Labetalol 10 mg IV every 4 hours PRN SBP >180 or DBP >110 ? ?Depression with anxiety ?--Continue sertraline 25 mg p.o. daily ? ?Chronic diarrhea ?GI PCR panel negative, C. difficile positive.  Afebrile. ?--C. difficile positive as above on oral vancomycin ? ? ?Hyperlipidemia ?--Continue atorvastatin 40 mg p.o. daily ? ?Anemia of chronic disease ?Hemoglobin 11.1, stable. ? ?Chronic diastolic CHF (congestive heart failure) ([place]) ?TTE 11/18/2021 with LVEF 60-65%, no regional wall motion normalities, grade 1 diastolic dysfunction, IVC normal.  Chest x-ray with no overt pulmonary edema. ?--Holding home Lasix due to worsening renal failure ?--Strict I's and O's and daily weights ? ?Physical deconditioning ?Patient has been at [facility] for several months;  patient declines return to [facility]. ?--PT/OT following ?--TOC for assistance with discharge planning ? ?Hypokalemia ?Potassium 3.4, magnesium 2.1.  We will replete potassium today. ?--Repeat electrolytes in a.m. ? ? ? ?DVT prophylaxis: heparin injection 5,000 Units Start: 01/26/22 1500  Heparin ? ?  Code Status: Full Code ?Family Communication: No family present at bedside this morning ? ?Disposition Plan:  ?Level of care: Med-Surg ?Status is: Inpatient ?Remains inpatient appropriate because: Continues on IV fluid hydration, started on oral vancomycin for positive C. difficile colitis, anticipate discharge back to SNF in 2-3 days ?  ? ?Consultants:  ?None ? ?Procedures:  ?Renal ultrasound ? ?Antimicrobials:  ?Oral vancomycin 3/30>> ? ? ?Subjective: ?Patient seen examined at bedside, resting comfortably.  Sitting in bedside chair.  No specific complaints this morning.  Reports lower extremity edema improved.  Reports he will not return back to [facility], and would like to go home with either his daughter or spouse.  No other specific questions or concerns at this time.  Denies headache, no chest pain, palpitations, no shortness of breath, no abdominal pain, no fever/chills/night sweats, no nausea/vomiting, no cough/congestion, no paresthesias.  No acute events overnight per nursing staff. ? ?Objective: ?Vitals:  ? 01/27/22 0128 01/27/22 [id number] 01/27/22 0459 01/27/22 0954  ?BP: 131/62  125/70 (!) 152/79  ?Pulse: 70  76 71  ?Resp: 18  18 18   ?Temp: 97.6 ?F (36.4 ?C)  99.1 ?F (37.3 ?C) 98.7 ?F (37.1 ?C)  ?TempSrc: Oral  Oral   ?SpO2: 99%  100% 99%  ?Weight:  69.2 kg    ? ? ?Intake/Output Summary (Last 24 hours) at 01/27/2022 1309 ?Last data filed at 01/27/2022 0600 ?Gross per 24 hour  ?Intake 1380 ml  ?Output 600 ml  ?  Net 780 ml  ? ?Filed Weights  ? 01/27/22 0456  ?Weight: 69.2 kg  ? ? ?Examination: ? ?Physical Exam: ?GEN: NAD, alert and oriented to place [facility]), but not time, person, or situation;  chronically ill in appearance, appears older than stated age ?HEENT: NCAT, PERRL, EOMI, sclera clear, MMM ?PULM: CTAB w/o wheezes/crackles, normal respiratory effort, on room air ?CV: RRR w/o M/G/R ?GI: abd soft, NTND, N

## 2022-01-28 DIAGNOSIS — Z794 Long term (current) use of insulin: Secondary | ICD-10-CM | POA: Diagnosis not present

## 2022-01-28 DIAGNOSIS — E1122 Type 2 diabetes mellitus with diabetic chronic kidney disease: Secondary | ICD-10-CM | POA: Diagnosis present

## 2022-01-28 DIAGNOSIS — Z79899 Other long term (current) drug therapy: Secondary | ICD-10-CM | POA: Diagnosis not present

## 2022-01-28 DIAGNOSIS — N1832 Chronic kidney disease, stage 3b: Secondary | ICD-10-CM | POA: Diagnosis present

## 2022-01-28 DIAGNOSIS — I5032 Chronic diastolic (congestive) heart failure: Secondary | ICD-10-CM | POA: Diagnosis present

## 2022-01-28 DIAGNOSIS — Z20822 Contact with and (suspected) exposure to covid-19: Secondary | ICD-10-CM | POA: Diagnosis present

## 2022-01-28 DIAGNOSIS — R600 Localized edema: Secondary | ICD-10-CM | POA: Diagnosis not present

## 2022-01-28 DIAGNOSIS — Z809 Family history of malignant neoplasm, unspecified: Secondary | ICD-10-CM | POA: Diagnosis not present

## 2022-01-28 DIAGNOSIS — F419 Anxiety disorder, unspecified: Secondary | ICD-10-CM | POA: Diagnosis present

## 2022-01-28 DIAGNOSIS — E669 Obesity, unspecified: Secondary | ICD-10-CM | POA: Diagnosis present

## 2022-01-28 DIAGNOSIS — E1165 Type 2 diabetes mellitus with hyperglycemia: Secondary | ICD-10-CM | POA: Diagnosis present

## 2022-01-28 DIAGNOSIS — Z87891 Personal history of nicotine dependence: Secondary | ICD-10-CM | POA: Diagnosis not present

## 2022-01-28 DIAGNOSIS — Z8249 Family history of ischemic heart disease and other diseases of the circulatory system: Secondary | ICD-10-CM | POA: Diagnosis not present

## 2022-01-28 DIAGNOSIS — W19XXXA Unspecified fall, initial encounter: Secondary | ICD-10-CM | POA: Diagnosis present

## 2022-01-28 DIAGNOSIS — D638 Anemia in other chronic diseases classified elsewhere: Secondary | ICD-10-CM | POA: Diagnosis not present

## 2022-01-28 DIAGNOSIS — Z833 Family history of diabetes mellitus: Secondary | ICD-10-CM | POA: Diagnosis not present

## 2022-01-28 DIAGNOSIS — F32A Depression, unspecified: Secondary | ICD-10-CM | POA: Diagnosis present

## 2022-01-28 DIAGNOSIS — D631 Anemia in chronic kidney disease: Secondary | ICD-10-CM | POA: Diagnosis present

## 2022-01-28 DIAGNOSIS — Z7902 Long term (current) use of antithrombotics/antiplatelets: Secondary | ICD-10-CM | POA: Diagnosis not present

## 2022-01-28 DIAGNOSIS — Z8673 Personal history of transient ischemic attack (TIA), and cerebral infarction without residual deficits: Secondary | ICD-10-CM | POA: Diagnosis not present

## 2022-01-28 DIAGNOSIS — E876 Hypokalemia: Secondary | ICD-10-CM | POA: Diagnosis present

## 2022-01-28 DIAGNOSIS — A0472 Enterocolitis due to Clostridium difficile, not specified as recurrent: Secondary | ICD-10-CM | POA: Diagnosis present

## 2022-01-28 DIAGNOSIS — N179 Acute kidney failure, unspecified: Secondary | ICD-10-CM | POA: Diagnosis present

## 2022-01-28 DIAGNOSIS — G9340 Encephalopathy, unspecified: Secondary | ICD-10-CM | POA: Diagnosis present

## 2022-01-28 DIAGNOSIS — E785 Hyperlipidemia, unspecified: Secondary | ICD-10-CM | POA: Diagnosis present

## 2022-01-28 DIAGNOSIS — Z7982 Long term (current) use of aspirin: Secondary | ICD-10-CM | POA: Diagnosis not present

## 2022-01-28 DIAGNOSIS — I130 Hypertensive heart and chronic kidney disease with heart failure and stage 1 through stage 4 chronic kidney disease, or unspecified chronic kidney disease: Secondary | ICD-10-CM | POA: Diagnosis present

## 2022-01-28 LAB — CBC
HCT: 29.9 % — ABNORMAL LOW (ref 39.0–52.0)
Hemoglobin: 10 g/dL — ABNORMAL LOW (ref 13.0–17.0)
MCH: 31 pg (ref 26.0–34.0)
MCHC: 33.4 g/dL (ref 30.0–36.0)
MCV: 92.6 fL (ref 80.0–100.0)
Platelets: 215 10*3/uL (ref 150–400)
RBC: 3.23 MIL/uL — ABNORMAL LOW (ref 4.22–5.81)
RDW: 13.4 % (ref 11.5–15.5)
WBC: 8.3 10*3/uL (ref 4.0–10.5)
nRBC: 0 % (ref 0.0–0.2)

## 2022-01-28 LAB — BASIC METABOLIC PANEL
Anion gap: 6 (ref 5–15)
BUN: 24 mg/dL — ABNORMAL HIGH (ref 8–23)
CO2: 22 mmol/L (ref 22–32)
Calcium: 7.9 mg/dL — ABNORMAL LOW (ref 8.9–10.3)
Chloride: 108 mmol/L (ref 98–111)
Creatinine, Ser: 2.59 mg/dL — ABNORMAL HIGH (ref 0.61–1.24)
GFR, Estimated: 27 mL/min — ABNORMAL LOW (ref >60)
Glucose, Bld: 173 mg/dL — ABNORMAL HIGH (ref 70–99)
Potassium: 3.4 mmol/L — ABNORMAL LOW (ref 3.5–5.1)
Sodium: 136 mmol/L (ref 135–145)

## 2022-01-28 LAB — GLUCOSE, CAPILLARY
Glucose-Capillary: 223 mg/dL — ABNORMAL HIGH (ref 70–99)
Glucose-Capillary: 162 mg/dL — ABNORMAL HIGH (ref 70–99)
Glucose-Capillary: 173 mg/dL — ABNORMAL HIGH (ref 70–99)
Glucose-Capillary: 143 mg/dL — ABNORMAL HIGH (ref 70–99)

## 2022-01-28 LAB — MAGNESIUM: Magnesium: 2 mg/dL (ref 1.7–2.4)

## 2022-01-28 MED ORDER — POTASSIUM CHLORIDE CRYS ER 20 MEQ PO TBCR
30.0000 meq | EXTENDED_RELEASE_TABLET | ORAL | Status: AC
  Administered 2022-01-28 (×2): 30 meq via ORAL
  Filled 2022-01-28 (×2): qty 1

## 2022-01-28 NOTE — Progress Notes (Signed)
?PROGRESS NOTE ? ? ? ?JESSON SENSKE  [id number] DOB: 1956/03/16 DOA: 01/25/2022 ?PCP: Vonna Drafts, FNP  ? ? ?Brief Narrative:  ?MARQUEZE PEA is a 66 year old male with past medical history significant for type 2 diabetes mellitus, essential hypertension, CKD stage IIIb, anemia of chronic kidney disease, depression/anxiety, chronic diarrhea, Hx ischemic CVA, with questionable cognitive impairment who presented to [facility] ED via EMS from [facility] SNF with confusion, worsening renal function and recent fall day prior.  Given patient is a poor historian with confusion, spouse at bedside contributes to the history; although she has very poor knowledge of his medical conditions and has not seen him regularly at the SNF.  Patient reports worsening lower extremity edema over the last 2 weeks and apparently nursing facility has increased his Lasix without improvement.  Labs from SNF with creatinine 2.6, baseline of 1.84 on 12/05/2021.  Also reports left hip pain and nursing home noted fall yesterday.  No other specific complaints or concerns at this time.  Denies headache, no dizziness, no chest pain, no palpitations, no shortness of breath, no cough/congestion, no nausea/vomiting, no abdominal pain, no weakness, no fatigue, no paresthesias.   ? ?In the ED, temperature 98.4 ?F, HR 74, RR 17, BP 201/99, SPO2 100% on room air.  WBC 15.9, hemoglobin 11.0, platelets 192.  Sodium 138, potassium 3.1, chloride 112, CO2 17, BUN 35, creatinine 3.18, glucose 106.  Chest x-ray with cardiomegaly with central vascular prominence without overt pulmonary edema or focal airspace consolidation.  EDP consulted [facility] for admission for acute on chronic renal failure. ?  ?  ?Assessment and Plan: ?* Acute renal failure superimposed on stage 3b chronic kidney disease ([place]) ?Patient presenting from SNF with elevated creatinine at facility of 2.6; and on presentation to the ED now up to 3.18.  Apparently SNF has been increasing his Lasix  due to progressive lower extremity edema.  Also patient's blood pressure very poorly controlled with poorly controlled diabetes likely contributing factor to progressive renal failure and volume loss with positive C. difficile colitis.  Renal ultrasound with no acute findings.  Urine sodium 22, urine creatinine 96; FeNa = 0.5%; which is consistent with prerenal. ?--Cr 3.18>3.10>3.09>3.05>2.59 ?--Sodium bicarbonate drip 100 mL/h  ?--Avoid nephrotoxins, renally dose all medications ?--Strict I's and O's and daily weights ?--BMP daily ? ?Acute encephalopathy ?Patient with confusion, unable to discern who the president is or what the year is.  Per spouse at bedside, has been unable to answer these questions correctly for some time.  Patient with history of ischemic CVA, unclear if patient with cognitive impairment versus developing dementia.  Patient is afebrile, no concerning consolidation on chest x-ray, unlikely infectious etiology.  BUN only 35.  CT head without contrast negative for acute findings. ?--SLP evaluated for cognitive impairment, appears to be at baseline following recent stroke ?--Supportive care ? ?Bilateral lower extremity edema ?Patient with progressive lower extremity edema bilaterally.  Apparently nursing home has been increasing Lasix without improvement and now with worsening renal failure.  Chest x-ray with no overt pulmonary edema.  D-dimer elevated 2.04.  Vascular duplex ultrasound bilateral lower extremities negative for DVT, although limited study; but notable for enlarged lymph nodes in the groin.  CT abdomen/pelvis without contrast with diffuse wall thickening ascending and transverse colon, pericolic stranding adjacent to the ascending colon consistent with infectious colitis, no evidence of obstruction or pneumoperitoneum, no hydronephrosis, right inguinal hernia, enlarged lymph nodes inguinal regions suggestive of reactive hyperplasia. ?--TED hose, lower extremity elevation ? ?  C.  difficile colitis ?C. difficile PCR positive. ?--Vancomycin 125 mg PO QID x 10 days ?--Enteric precautions ? ?Left hip pain ?Nursing home noted fall yesterday.  Patient complaining of left hip pain with active/passive range of motion.  Left hip/pelvis x-ray with no acute osseous abnormality. ?--PT/OT following, recommend return to SNF ? ?Type 2 diabetes mellitus with complication, with long-term current use of insulin ([place]) ?Hemoglobin A1c 5.2, well controlled.  Patient is prescribed Lantus 45 units [place] daily and Glyxambi, but review of SNF records appears that he has not been receiving insulin.  Hemoglobin A1c 5.2, well controlled. ?--Hold long-acting insulin for now ?--SSI for coverage ?--CBG before every meal/at bedtime ? ?Essential hypertension ?BP 201/99; uncontrolled.  On amlodipine 10 mg p.o. daily, carvedilol 25 mg p.o. twice daily, hydralazine 100 mg p.o. 3 times daily, clonidine 0.3mg  PO TID and spironolactone outpatient. ?--Holding spironolactone and furosemide due to AKI as above ?--Amlodipine 10 mg p.o. daily ?--Carvedilol 25 g p.o. BID ?--Hydralazine 100 mg p.o. TID ?--Clonidine 0.3 mg p.o. TID ?--Labetalol 10 mg IV every 4 hours PRN SBP >180 or DBP >110 ? ?Depression with anxiety ?--Continue sertraline 25 mg p.o. daily ? ?Chronic diarrhea ?GI PCR panel negative, C. difficile positive.  Afebrile. ?--C. difficile positive as above on oral vancomycin ? ? ?Hyperlipidemia ?--Continue atorvastatin 40 mg p.o. daily ? ?Anemia of chronic disease ?Hemoglobin 11.1, stable. ? ?Chronic diastolic CHF (congestive heart failure) ([place]) ?TTE 11/18/2021 with LVEF 60-65%, no regional wall motion normalities, grade 1 diastolic dysfunction, IVC normal.  Chest x-ray with no overt pulmonary edema. ?--Holding home Lasix due to worsening renal failure ?--Strict I's and O's and daily weights ? ?Physical deconditioning ?Patient has been at [facility] for several months; patient declines return to [facility]. ?--PT/OT  following ?--TOC for assistance with discharge planning ? ?Hypokalemia ?Potassium 3.4, magnesium 2.1.  Will replete potassium today. ?--Repeat electrolytes in a.m. ? ? ? ?DVT prophylaxis: Place TED hose Start: 01/27/22 1312 ?heparin injection 5,000 Units Start: 01/26/22 1500  Heparin ? ?  Code Status: Full Code ?Family Communication: No family present at bedside this morning ? ?Disposition Plan:  ?Level of care: Med-Surg ?Status is: Inpatient ?Remains inpatient appropriate because: Continues on IV fluid hydration, started on oral vancomycin for positive C. difficile colitis, anticipate discharge back to SNF in 2-3 days ?  ? ?Consultants:  ?None ? ?Procedures:  ?Renal ultrasound ? ?Antimicrobials:  ?Oral vancomycin 3/30>> ? ? ?Subjective: ?Patient seen examined at bedside, resting comfortably.  Lying in bed.  Continues with multiple bowel movements, rectocele placed today but now removed.  No other specific complaints or concerns at this time.  No family present at bedside.  Denies headache, no chest pain, palpitations, no shortness of breath, no abdominal pain, no fever/chills/night sweats, no nausea/vomiting, no cough/congestion, no paresthesias.  No acute events overnight per nursing staff. ? ?Objective: ?Vitals:  ? 01/27/22 1634 01/27/22 2152 01/28/22 0530 01/28/22 0843  ?BP: (!) 183/100 (!) 155/79 (!) 177/88 (!) 169/96  ?Pulse: 70 70 74   ?Resp: 18 16 18    ?Temp: 98.5 ?F (36.9 ?C) (!) 97.4 ?F (36.3 ?C) 98.2 ?F (36.8 ?C)   ?TempSrc:  Oral    ?SpO2: 100% 98% 95%   ?Weight:      ? ? ?Intake/Output Summary (Last 24 hours) at 01/28/2022 1150 ?Last data filed at 01/28/2022 0849 ?Gross per 24 hour  ?Intake 2451.14 ml  ?Output 550 ml  ?Net 1901.14 ml  ? ?Filed Weights  ? 01/27/22 0456  ?Weight:  69.2 kg  ? ? ?Examination: ? ?Physical Exam: ?GEN: NAD, alert and oriented to place [facility]), but not time, person, or situation; chronically ill in appearance, appears older than stated age ?HEENT: NCAT, PERRL, EOMI, sclera  clear, MMM ?PULM: CTAB w/o wheezes/crackles, normal respiratory effort, on room air ?CV: RRR w/o M/G/R ?GI: abd soft, NTND, NABS, no R/G/M ?MSK: 2+ pitting edema bilateral lower extremities, muscle strength globally

## 2022-01-28 NOTE — Progress Notes (Signed)
Patient's flexiseal came out last night,RN able to place it back. This morning,patient bear down and push it out while staff was in the room,balloon still inflated. Attempted to put it back but patient adamantly refused. ?

## 2022-01-29 DIAGNOSIS — N179 Acute kidney failure, unspecified: Secondary | ICD-10-CM | POA: Diagnosis not present

## 2022-01-29 DIAGNOSIS — G9340 Encephalopathy, unspecified: Secondary | ICD-10-CM | POA: Diagnosis not present

## 2022-01-29 DIAGNOSIS — R600 Localized edema: Secondary | ICD-10-CM | POA: Diagnosis not present

## 2022-01-29 DIAGNOSIS — D638 Anemia in other chronic diseases classified elsewhere: Secondary | ICD-10-CM | POA: Diagnosis not present

## 2022-01-29 LAB — BASIC METABOLIC PANEL
Anion gap: 10 (ref 5–15)
BUN: 23 mg/dL (ref 8–23)
CO2: 24 mmol/L (ref 22–32)
Calcium: 8 mg/dL — ABNORMAL LOW (ref 8.9–10.3)
Chloride: 104 mmol/L (ref 98–111)
Creatinine, Ser: 2.36 mg/dL — ABNORMAL HIGH (ref 0.61–1.24)
GFR, Estimated: 30 mL/min — ABNORMAL LOW (ref >60)
Glucose, Bld: 206 mg/dL — ABNORMAL HIGH (ref 70–99)
Potassium: 3.8 mmol/L (ref 3.5–5.1)
Sodium: 138 mmol/L (ref 135–145)

## 2022-01-29 LAB — GLUCOSE, CAPILLARY
Glucose-Capillary: 169 mg/dL — ABNORMAL HIGH (ref 70–99)
Glucose-Capillary: 165 mg/dL — ABNORMAL HIGH (ref 70–99)
Glucose-Capillary: 163 mg/dL — ABNORMAL HIGH (ref 70–99)
Glucose-Capillary: 133 mg/dL — ABNORMAL HIGH (ref 70–99)

## 2022-01-29 MED ORDER — INSULIN GLARGINE-YFGN 100 UNIT/ML ~~LOC~~ SOLN
5.0000 [IU] | Freq: Every day | SUBCUTANEOUS | Status: DC
  Administered 2022-01-29 – 2022-01-31 (×3): 5 [IU] via SUBCUTANEOUS
  Filled 2022-01-29 (×3): qty 0.05

## 2022-01-29 MED ORDER — SODIUM CHLORIDE 0.9 % IV SOLN: INTRAVENOUS | Status: DC

## 2022-01-29 NOTE — Progress Notes (Signed)
?PROGRESS NOTE ? ? ? ?Manuel Kramer  [id number] DOB: 1956/08/02 DOA: 01/25/2022 ?PCP: Vonna Drafts, FNP  ? ? ?Brief Narrative:  ?Manuel Kramer is a 66 year old male with past medical history significant for type 2 diabetes mellitus, essential hypertension, CKD stage IIIb, anemia of chronic kidney disease, depression/anxiety, chronic diarrhea, Hx ischemic CVA, with questionable cognitive impairment who presented to [facility] ED via EMS from [facility] with confusion, worsening renal function and recent fall day prior.  Given patient is a poor historian with confusion, spouse at bedside contributes to the history; although she has very poor knowledge of his medical conditions and has not seen him regularly at the SNF.  Patient reports worsening lower extremity edema over the last 2 weeks and apparently nursing facility has increased his Lasix without improvement.  Labs from SNF with creatinine 2.6, baseline of 1.84 on 12/05/2021.  Also reports left hip pain and nursing home noted fall yesterday.  No other specific complaints or concerns at this time.  Denies headache, no dizziness, no chest pain, no palpitations, no shortness of breath, no cough/congestion, no nausea/vomiting, no abdominal pain, no weakness, no fatigue, no paresthesias.   ? ?In the ED, temperature 98.4 ?F, HR 74, RR 17, BP 201/99, SPO2 100% on room air.  WBC 15.9, hemoglobin 11.0, platelets 192.  Sodium 138, potassium 3.1, chloride 112, CO2 17, BUN 35, creatinine 3.18, glucose 106.  Chest x-ray with cardiomegaly with central vascular prominence without overt pulmonary edema or focal airspace consolidation.  EDP consulted [facility] for admission for acute on chronic renal failure. ?  ?  ?Assessment and Plan: ?* Acute renal failure superimposed on stage 3b chronic kidney disease ([place]) ?Patient presenting from SNF with elevated creatinine at facility of 2.6; and on presentation to the ED now up to 3.18.  Apparently SNF has been increasing his Lasix  due to progressive lower extremity edema.  Also patient's blood pressure very poorly controlled with poorly controlled diabetes likely contributing factor to progressive renal failure and volume loss with positive C. difficile colitis.  Renal ultrasound with no acute findings.  Urine sodium 22, urine creatinine 96; FeNa = 0.5%; which is consistent with prerenal. ?--Cr 3.18>3.10>3.09>3.05>2.59>2.36 ?--NS at 100 mL/h  ?--Avoid nephrotoxins, renally dose all medications ?--Strict I's and O's and daily weights ?--BMP daily ? ?Acute encephalopathy ?Patient with confusion, unable to discern who the president is or what the year is.  Per spouse at bedside, has been unable to answer these questions correctly for some time.  Patient with history of ischemic CVA, unclear if patient with cognitive impairment versus developing dementia.  Patient is afebrile, no concerning consolidation on chest x-ray, unlikely infectious etiology.  BUN only 35.  CT head without contrast negative for acute findings. ?--SLP evaluated for cognitive impairment, appears to be at baseline following recent stroke ?--Supportive care ? ?Bilateral lower extremity edema ?Patient with progressive lower extremity edema bilaterally.  Apparently nursing home has been increasing Lasix without improvement and now with worsening renal failure.  Chest x-ray with no overt pulmonary edema.  D-dimer elevated 2.04.  Vascular duplex ultrasound bilateral lower extremities negative for DVT, although limited study; but notable for enlarged lymph nodes in the groin.  CT abdomen/pelvis without contrast with diffuse wall thickening ascending and transverse colon, pericolic stranding adjacent to the ascending colon consistent with infectious colitis, no evidence of obstruction or pneumoperitoneum, no hydronephrosis, right inguinal hernia, enlarged lymph nodes inguinal regions suggestive of reactive hyperplasia. ?--TED hose, lower extremity elevation ? ?  C. difficile  colitis ?C. difficile PCR positive. ?--Vancomycin 125 mg PO QID x 10 days ?--Enteric precautions ? ?Left hip pain ?Nursing home noted fall yesterday.  Patient complaining of left hip pain with active/passive range of motion.  Left hip/pelvis x-ray with no acute osseous abnormality. ?--PT/OT following, recommend return to SNF ? ?Type 2 diabetes mellitus with complication, with long-term current use of insulin (HCC) ?Hemoglobin A1c 5.2, well controlled.  Patient is prescribed Lantus 45 units [place] daily and Glyxambi, but review of SNF records appears that he has not been receiving insulin.  Hemoglobin A1c 5.2, well controlled. ?--Semglee 5u [place] daily ?--SSI for coverage ?--CBG before every meal/at bedtime ? ?Essential hypertension ?BP 201/99; uncontrolled.  On amlodipine 10 mg p.o. daily, carvedilol 25 mg p.o. twice daily, hydralazine 100 mg p.o. 3 times daily, clonidine 0.3mg  PO TID and spironolactone outpatient. ?--Holding spironolactone and furosemide due to AKI as above ?--Amlodipine 10 mg p.o. daily ?--Carvedilol 25 g p.o. BID ?--Hydralazine 100 mg p.o. TID ?--Clonidine 0.3 mg p.o. TID ?--Labetalol 10 mg IV every 4 hours PRN SBP >180 or DBP >110 ? ?Depression with anxiety ?--Continue sertraline 25 mg p.o. daily ? ?Chronic diarrhea ?GI PCR panel negative, C. difficile positive.  Afebrile. ?--C. difficile positive as above on oral vancomycin ? ? ?Hyperlipidemia ?--Continue atorvastatin 40 mg p.o. daily ? ?Anemia of chronic disease ?Hemoglobin 11.1, stable. ? ?Chronic diastolic CHF (congestive heart failure) (HCC) ?TTE 11/18/2021 with LVEF 60-65%, no regional wall motion normalities, grade 1 diastolic dysfunction, IVC normal.  Chest x-ray with no overt pulmonary edema. ?--Holding home Lasix due to worsening renal failure ?--Strict I's and O's and daily weights ? ?Physical deconditioning ?Patient has been at [facility] for several months; patient declines return to [facility]. ?--PT/OT following ?--TOC for assistance  with discharge planning ? ?Hypokalemia ?Potassium 3.8 today ?--Repeat electrolytes in a.m. ? ? ? ?DVT prophylaxis: Place TED hose Start: 01/27/22 1312 ?heparin injection 5,000 Units Start: 01/26/22 1500  Heparin ? ?  Code Status: Full Code ?Family Communication: No family present at bedside this morning ? ?Disposition Plan:  ?Level of care: Med-Surg ?Status is: Inpatient ?Remains inpatient appropriate because: Continues on IV fluid hydration, started on oral vancomycin for positive C. difficile colitis, anticipate discharge back to SNF once creatinine stabilizes and diarrhea improves ?  ? ?Consultants:  ?None ? ?Procedures:  ?Renal ultrasound ? ?Antimicrobials:  ?Oral vancomycin 3/30>> ? ? ?Subjective: ?Patient seen examined at bedside, resting comfortably.  Lying in bed eating breakfast.  Continues with multiple loose stools.  No other specific questions or concerns at this time.  Denies headache, no chest pain, palpitations, no shortness of breath, no abdominal pain, no fever/chills/night sweats, no nausea/vomiting, no cough/congestion, no paresthesias.  No acute events overnight per nursing staff. ? ?Objective: ?Vitals:  ? 01/28/22 0843 01/28/22 1718 01/28/22 2015 01/29/22 0519  ?BP: (!) 169/96 (!) 174/88 (!) 169/94 (!) 183/91  ?Pulse:  73 73 73  ?Resp:  18 18 18   ?Temp:  97.8 ?F (36.6 ?C) 98.3 ?F (36.8 ?C) 98.7 ?F (37.1 ?C)  ?TempSrc:  Oral Oral Oral  ?SpO2:  95% 94% 92%  ?Weight:      ? ? ?Intake/Output Summary (Last 24 hours) at 01/29/2022 1058 ?Last data filed at 01/29/2022 0900 ?Gross per 24 hour  ?Intake 2940.11 ml  ?Output 950 ml  ?Net 1990.11 ml  ? ?Filed Weights  ? 01/27/22 0456  ?Weight: 69.2 kg  ? ? ?Examination: ? ?Physical Exam: ?GEN: NAD, alert and oriented to  place [facility]), but not time, person, or situation; chronically ill in appearance, appears older than stated age ?HEENT: NCAT, PERRL, EOMI, sclera clear, MMM ?PULM: CTAB w/o wheezes/crackles, normal respiratory effort, on room air ?CV: RRR  w/o M/G/R ?GI: abd soft, NTND, NABS, no R/G/M ?MSK: 2+ pitting edema bilateral lower extremities, muscle strength globally intact 5/5 bilateral upper/lower extremities ?NEURO: CN II-XII intact, no focal defic

## 2022-01-30 DIAGNOSIS — D638 Anemia in other chronic diseases classified elsewhere: Secondary | ICD-10-CM | POA: Diagnosis not present

## 2022-01-30 DIAGNOSIS — N179 Acute kidney failure, unspecified: Secondary | ICD-10-CM | POA: Diagnosis not present

## 2022-01-30 DIAGNOSIS — R600 Localized edema: Secondary | ICD-10-CM | POA: Diagnosis not present

## 2022-01-30 DIAGNOSIS — G9340 Encephalopathy, unspecified: Secondary | ICD-10-CM | POA: Diagnosis not present

## 2022-01-30 LAB — BASIC METABOLIC PANEL
Anion gap: 7 (ref 5–15)
BUN: 21 mg/dL (ref 8–23)
CO2: 23 mmol/L (ref 22–32)
Calcium: 7.9 mg/dL — ABNORMAL LOW (ref 8.9–10.3)
Chloride: 108 mmol/L (ref 98–111)
Creatinine, Ser: 2.26 mg/dL — ABNORMAL HIGH (ref 0.61–1.24)
GFR, Estimated: 32 mL/min — ABNORMAL LOW (ref >60)
Glucose, Bld: 127 mg/dL — ABNORMAL HIGH (ref 70–99)
Potassium: 3.5 mmol/L (ref 3.5–5.1)
Sodium: 138 mmol/L (ref 135–145)

## 2022-01-30 LAB — GLUCOSE, CAPILLARY
Glucose-Capillary: 128 mg/dL — ABNORMAL HIGH (ref 70–99)
Glucose-Capillary: 149 mg/dL — ABNORMAL HIGH (ref 70–99)
Glucose-Capillary: 146 mg/dL — ABNORMAL HIGH (ref 70–99)
Glucose-Capillary: 122 mg/dL — ABNORMAL HIGH (ref 70–99)

## 2022-01-30 MED ORDER — LABETALOL HCL 5 MG/ML IV SOLN
20.0000 mg | INTRAVENOUS | Status: DC | PRN
  Administered 2022-01-31: 20 mg via INTRAVENOUS
  Filled 2022-01-30: qty 4

## 2022-01-30 MED ORDER — POTASSIUM CHLORIDE CRYS ER 20 MEQ PO TBCR
40.0000 meq | EXTENDED_RELEASE_TABLET | Freq: Once | ORAL | Status: AC
  Administered 2022-01-30: 40 meq via ORAL
  Filled 2022-01-30: qty 2

## 2022-01-30 NOTE — NC FL2 (Signed)
?[place] MEDICAID FL2 LEVEL OF CARE SCREENING TOOL  ?  ? ?IDENTIFICATION  ?Patient Name: ?Manuel Kramer Birthdate: 09-Dec-1955 Sex: male Admission Date (Current Location): ?01/25/2022  ?[place] and [place] Number: ? [place] ?[id number] L Facility and Address:  ?  ?     Provider Number: ?   ?Attending Physician Name and Address:  ?[place] Eric J, DO ? Relative Name and Phone Number:  ?Madonna,Chris (Son)   [phone] ?   ?Current Level of Care: ?Hospital Recommended Level of Care: ?[place] Prior Approval Number: ?  ? ?Date Approved/Denied: ?  PASRR Number: ?[id number] ? ?Discharge Plan: ?SNF ?  ? ?Current Diagnoses: ?Patient Active Problem List  ? Diagnosis Date Noted  ? C. difficile colitis 01/26/2022  ? Hypokalemia 01/26/2022  ? Acute renal failure superimposed on stage 3b chronic kidney disease ([place]) 01/25/2022  ? Bilateral lower extremity edema 01/25/2022  ? Type 2 diabetes mellitus with complication, with long-term current use of insulin ([place]) 01/25/2022  ? Depression with anxiety 01/25/2022  ? Chronic diarrhea 01/25/2022  ? Physical deconditioning 01/25/2022  ? Left hip pain 01/25/2022  ? Anemia of chronic disease 01/25/2022  ? Chronic diastolic CHF (congestive heart failure) ([place]) 01/25/2022  ? Cerebral embolism with cerebral infarction 11/17/2021  ? Protein-calorie malnutrition, severe 11/16/2021  ? Acute encephalopathy 11/14/2021  ? AKI (acute kidney injury) ([place])   ? Hyperglycemia   ? Hypertensive emergency without congestive heart failure   ? Sepsis ([place])   ? Hyperlipidemia 05/22/2016  ? Diabetic neuropathy ([place]) 05/22/2016  ? Other specified diabetes mellitus without complications ([place]) [id number]  ? Essential hypertension 10/02/2014  ? Smoking 10/02/2014  ? Family history of prostate cancer 10/02/2014  ? Renal insufficiency 10/02/2014  ? ? ?Orientation RESPIRATION BLADDER Height & Weight   ?  ?Self, Situation, Place ? Normal Continent Weight: 162 lb 11.2 oz (73.8 kg) ?Height:      ?BEHAVIORAL SYMPTOMS/MOOD NEUROLOGICAL BOWEL NUTRITION STATUS  ?    Continent Diet (see d/c summary)  ?AMBULATORY STATUS COMMUNICATION OF NEEDS Skin   ?Extensive Assist Verbally PU Stage and Appropriate Care ?  ?  ?  ?    ?     ?     ? ? ?Personal Care Assistance Level of Assistance  ?Bathing, Feeding, Dressing Bathing Assistance: Maximum assistance ?Feeding assistance: Independent ?Dressing Assistance: Limited assistance ?   ? ?Functional Limitations Info  ?Hearing, Speech, Sight Sight Info: Impaired ?Hearing Info: Adequate ?Speech Info: Adequate  ? ? ?SPECIAL CARE FACTORS FREQUENCY  ?PT (By licensed PT), OT (By licensed OT)   ?  ?PT Frequency: 5x/ week ?OT Frequency: 5x/ week ?  ?  ?  ?   ? ? ?Contractures Contractures Info: Not present  ? ? ?Additional Factors Info  ?Code Status, Allergies, Insulin Sliding Scale, Isolation Precautions Code Status Info: Full ?Allergies Info: Codeine   Iodinated Contrast Media   Pentazocine   Statins ?  ?Insulin Sliding Scale Info: see d/c medication list ?Isolation Precautions Info: Enteric ?   ? ?Current Medications (01/30/2022):  This is the current hospital active medication list ?Current Facility-Administered Medications  ?Medication Dose Route Frequency Provider Last Rate Last Admin  ? 0.9 %  sodium chloride infusion   Intravenous Continuous [place] Eric J, DO 100 mL/hr at 01/30/22 0322 New Bag at 01/30/22 0322  ? acetaminophen (TYLENOL) tablet 650 mg  650 mg Oral Q6H PRN [place] Eric J, DO      ? Or  ? acetaminophen (TYLENOL) suppository 650 mg  650 mg Rectal Q6H PRN [place] Donnamarie Poag, DO      ? albuterol (PROVENTIL) (2.5 MG/3ML) 0.083% nebulizer solution 2.5 mg  2.5 mg Nebulization Q2H PRN [place] Eric J, DO      ? amLODipine (NORVASC) tablet 10 mg  10 mg Oral Daily [place] Donnamarie Poag, DO   10 mg at 01/30/22 [id number]  ? atorvastatin (LIPITOR) tablet 40 mg  40 mg Oral Daily [place] Eric J, DO   40 mg at 01/30/22 [id number]  ? carvedilol (COREG) tablet 25 mg  25 mg Oral BID WC [place]  Eric J, DO   25 mg at 01/30/22 [id number]  ? cloNIDine (CATAPRES) tablet 0.3 mg  0.3 mg Oral TID [place] Eric J, DO   0.3 mg at 01/30/22 [id number]  ? Gerhardt's butt cream   Topical BID [place] Eric J, DO   Given at 01/30/22 [id number]  ? heparin injection 5,000 Units  5,000 Units Subcutaneous Q8H Skeet Simmer, RPH   5,000 Units at 01/30/22 [id number]  ? hydrALAZINE (APRESOLINE) tablet 100 mg  100 mg Oral Q8H [place] Donnamarie Poag, DO   100 mg at 01/30/22 [id number]  ? insulin aspart (novoLOG) injection 0-5 Units  0-5 Units Subcutaneous QHS [place] Donnamarie Poag, DO      ? insulin aspart (novoLOG) injection 0-9 Units  0-9 Units Subcutaneous TID WC [place] Eric J, DO   1 Units at 01/30/22 [id number]  ? insulin glargine-yfgn [facility]) injection 5 Units  5 Units Subcutaneous Daily [place] Eric J, [place]   5 Units at 01/30/22 [id number]  ? labetalol (NORMODYNE) injection 20 mg  20 mg Intravenous Q4H PRN [place] Donnamarie Poag, DO      ? ondansetron [facility]) tablet 4 mg  4 mg Oral Q6H PRN [place] Eric J, DO      ? Or  ? ondansetron (ZOFRAN) injection 4 mg  4 mg Intravenous Q6H PRN [place] Donnamarie Poag, DO      ? oxyCODONE (Oxy IR/ROXICODONE) immediate release tablet 5 mg  5 mg Oral Q4H PRN [place] Eric J, DO   5 mg at 01/25/22 2308  ? sertraline (ZOLOFT) tablet 25 mg  25 mg Oral Daily [place] Eric J, DO   25 mg at 01/30/22 [id number]  ? vancomycin (VANCOCIN) capsule 125 mg  125 mg Oral QID [place] Eric J, DO   125 mg at 01/30/22 [id number]  ? ? ? ?Discharge Medications: ?Please see discharge summary for a list of discharge medications. ? ?Relevant Imaging Results: ? ?Relevant Lab Results: ? ? ?Additional Information ?SS#: [id number] ? ?Paulene Floor Flossie Wexler, LCSWA ? ? ? ? ?

## 2022-01-30 NOTE — TOC Progression Note (Signed)
Transition of Care (TOC) - Initial/Assessment Note  ? ? ?Patient Details  ?Name: Manuel Kramer ?MRN: [id number] ?Date of Birth: 1956-08-20 ? ?Transition of Care (TOC) CM/SW Contact:    ?Paulene Floor Zimere Dunlevy, LCSWA ?Phone Number: ?01/30/2022, 10:36 AM ? ?Clinical Narrative:                 ?CSW spoke with the patient's son, Masaki Padley.  The son is agreeable with the patient returning to [facility] at discharge and would like information about home health/ personal care options for patient.  RNCM notified.  ? ?  ?  ? ? ?Patient Goals and CMS Choice ?  ?  ?  ? ?Expected Discharge Plan and Services ?  ?  ?  ?  ?  ?                ?  ?  ?  ?  ?  ?  ?  ?  ?  ?  ? ?Prior Living Arrangements/Services ?  ?  ?  ?       ?  ?  ?  ?  ? ?Activities of Daily Living ?  ?  ? ?Permission Sought/Granted ?  ?  ?   ?   ?   ?   ? ?Emotional Assessment ?  ?  ?  ?  ?  ?  ? ?Admission diagnosis:  AKI (acute kidney injury) ([place]) [N17.9] ?Acute renal failure superimposed on stage 3b chronic kidney disease ([place]) [N17.9, N18.32] ?Patient Active Problem List  ? Diagnosis Date Noted  ? C. difficile colitis 01/26/2022  ? Hypokalemia 01/26/2022  ? Acute renal failure superimposed on stage 3b chronic kidney disease ([place]) 01/25/2022  ? Bilateral lower extremity edema 01/25/2022  ? Type 2 diabetes mellitus with complication, with long-term current use of insulin ([place]) 01/25/2022  ? Depression with anxiety 01/25/2022  ? Chronic diarrhea 01/25/2022  ? Physical deconditioning 01/25/2022  ? Left hip pain 01/25/2022  ? Anemia of chronic disease 01/25/2022  ? Chronic diastolic CHF (congestive heart failure) ([place]) 01/25/2022  ? Cerebral embolism with cerebral infarction 11/17/2021  ? Protein-calorie malnutrition, severe 11/16/2021  ? Acute encephalopathy 11/14/2021  ? AKI (acute kidney injury) ([place])   ? Hyperglycemia   ? Hypertensive emergency without congestive heart failure   ? Sepsis ([place])   ? Hyperlipidemia 05/22/2016  ? Diabetic neuropathy ([place])  05/22/2016  ? Other specified diabetes mellitus without complications ([place]) [id number]  ? Essential hypertension 10/02/2014  ? Smoking 10/02/2014  ? Family history of prostate cancer 10/02/2014  ? Renal insufficiency 10/02/2014  ? ?PCP:  Vonna Drafts, FNP ?Pharmacy:   ?[facility] [address] [employer], [address] ?[place] [address] ?Phone: [phone] Fax: [phone] ? ?[place], [place] - [address] ?[address] ?[place] [address] ?Phone: [phone] Fax: [phone] ? ?[id number] - [place], [address] ?[place] ?[place] [address] ?Phone: [phone] Fax: [phone] ? ? ? ? ?Social Determinants of Health (SDOH) Interventions ?  ? ?Readmission Risk Interventions ?   ? View : No data to display.  ?  ?  ?  ? ? ? ?

## 2022-01-30 NOTE — Progress Notes (Signed)
?PROGRESS NOTE ? ? ? ?Manuel Kramer  [id number] DOB: [birth year] DOA: 01/25/2022 ?PCP: Vonna Drafts, FNP  ? ? ?Brief Narrative:  ?Manuel Kramer is a 66 year old male with past medical history significant for type 2 diabetes mellitus, essential hypertension, CKD stage IIIb, anemia of chronic kidney disease, depression/anxiety, chronic diarrhea, Hx ischemic CVA, with questionable cognitive impairment who presented to [facility] ED via EMS from [facility] with confusion, worsening renal function and recent fall day prior.  Given patient is a poor historian with confusion, spouse at bedside contributes to the history; although she has very poor knowledge of his medical conditions and has not seen him regularly at the SNF.  Patient reports worsening lower extremity edema over the last 2 weeks and apparently nursing facility has increased his Lasix without improvement.  Labs from SNF with creatinine 2.6, baseline of 1.84 on 12/05/2021.  Also reports left hip pain and nursing home noted fall yesterday.  No other specific complaints or concerns at this time.  Denies headache, no dizziness, no chest pain, no palpitations, no shortness of breath, no cough/congestion, no nausea/vomiting, no abdominal pain, no weakness, no fatigue, no paresthesias.   ? ?In the ED, temperature 98.4 ?F, HR 74, RR 17, BP 201/99, SPO2 100% on room air.  WBC 15.9, hemoglobin 11.0, platelets 192.  Sodium 138, potassium 3.1, chloride 112, CO2 17, BUN 35, creatinine 3.18, glucose 106.  Chest x-ray with cardiomegaly with central vascular prominence without overt pulmonary edema or focal airspace consolidation.  EDP consulted [facility] for admission for acute on chronic renal failure. ?  ?  ?Assessment and Plan: ?* Acute renal failure superimposed on stage 3b chronic kidney disease ([place]) ?Patient presenting from SNF with elevated creatinine at facility of 2.6; and on presentation to the ED now up to 3.18.  Apparently SNF has been increasing his Lasix  due to progressive lower extremity edema.  Also patient's blood pressure very poorly controlled with poorly controlled diabetes likely contributing factor to progressive renal failure and volume loss with positive C. difficile colitis.  Renal ultrasound with no acute findings.  Urine sodium 22, urine creatinine 96; FeNa = 0.5%; which is consistent with prerenal. ?--Cr 3.18>3.10>3.09>3.05>2.59>2.36>2.26 ?--NS at 100 mL/h  ?--Avoid nephrotoxins, renally dose all medications ?--Strict I's and O's and daily weights ?--BMP daily ? ?Acute encephalopathy ?Patient with confusion, unable to discern who the president is or what the year is.  Per spouse at bedside, has been unable to answer these questions correctly for some time.  Patient with history of ischemic CVA, unclear if patient with cognitive impairment versus developing dementia.  Patient is afebrile, no concerning consolidation on chest x-ray, unlikely infectious etiology.  BUN only 35.  CT head without contrast negative for acute findings. ?--SLP evaluated for cognitive impairment, appears to be at baseline following recent stroke ?--Supportive care ? ?Bilateral lower extremity edema ?Patient with progressive lower extremity edema bilaterally.  Apparently nursing home has been increasing Lasix without improvement and now with worsening renal failure.  Chest x-ray with no overt pulmonary edema.  D-dimer elevated 2.04.  Vascular duplex ultrasound bilateral lower extremities negative for DVT, although limited study; but notable for enlarged lymph nodes in the groin.  CT abdomen/pelvis without contrast with diffuse wall thickening ascending and transverse colon, pericolic stranding adjacent to the ascending colon consistent with infectious colitis, no evidence of obstruction or pneumoperitoneum, no hydronephrosis, right inguinal hernia, enlarged lymph nodes inguinal regions suggestive of reactive hyperplasia. ?--TED hose, lower extremity elevation ? ?  C. difficile  colitis ?C. difficile PCR positive. ?--BM x 13 recorded over the past 24 hours ?--Vancomycin 125 mg PO QID x 10 days ?--Enteric precautions ? ?Left hip pain ?Nursing home noted fall yesterday.  Patient complaining of left hip pain with active/passive range of motion.  Left hip/pelvis x-ray with no acute osseous abnormality. ?--PT/OT following, recommend return to SNF ? ?Type 2 diabetes mellitus with complication, with long-term current use of insulin ([place]) ?Hemoglobin A1c 5.2, well controlled.  Patient is prescribed Lantus 45 units [place] daily and Glyxambi, but review of SNF records appears that he has not been receiving insulin.  Hemoglobin A1c 5.2, well controlled. ?--Semglee 5u [place] daily ?--SSI for coverage ?--CBG before every meal/at bedtime ? ?Essential hypertension ?BP 201/99; uncontrolled.  On amlodipine 10 mg p.o. daily, carvedilol 25 mg p.o. twice daily, hydralazine 100 mg p.o. 3 times daily, clonidine 0.3mg  PO TID and spironolactone outpatient. ?--Holding spironolactone and furosemide due to AKI as above ?--Amlodipine 10 mg p.o. daily ?--Carvedilol 25 g p.o. BID ?--Hydralazine 100 mg p.o. TID ?--Clonidine 0.3 mg p.o. TID ?--Labetalol 20 mg IV every 4 hours PRN SBP >180 or DBP >110 ? ?Depression with anxiety ?--Continue sertraline 25 mg p.o. daily ? ?Hyperlipidemia ?--Continue atorvastatin 40 mg p.o. daily ? ?Anemia of chronic disease ?Hemoglobin 11.1, stable. ? ?Chronic diastolic CHF (congestive heart failure) ([place]) ?TTE 11/18/2021 with LVEF 60-65%, no regional wall motion normalities, grade 1 diastolic dysfunction, IVC normal.  Chest x-ray with no overt pulmonary edema. ?--Holding home Lasix due to worsening renal failure ?--Strict I's and O's and daily weights ? ?Physical deconditioning ?Patient has been at [facility] for several months ?--PT/OT following ?--TOC for assistance with discharge planning ? ?Hypokalemia ?Potassium 3.5 today; replace K today ?--Repeat electrolytes in a.m. ? ? ? ?DVT prophylaxis:  Place TED hose Start: 01/27/22 1312 ?heparin injection 5,000 Units Start: 01/26/22 1500  Heparin ? ?  Code Status: Full Code ?Family Communication: No family present at bedside this morning ? ?Disposition Plan:  ?Level of care: Med-Surg ?Status is: Inpatient ?Remains inpatient appropriate because: Continues on IV fluid hydration, oral vancomycin for positive C. difficile colitis, anticipate discharge back to SNF once creatinine stabilizes and diarrhea improves; continues with 13 bowel movements over the past 24 hours ?  ? ?Consultants:  ?None ? ?Procedures:  ?Renal ultrasound ? ?Antimicrobials:  ?Oral vancomycin 3/30>> ? ? ?Subjective: ?Patient seen examined at bedside, resting comfortably.  Lying in bed eating breakfast.  No nausea/vomiting.  Continues with profuse diarrhea, 13 episodes reported over the past 24 hours.  Creatinine continues to slowly improve on IV fluid hydration.  No other specific questions or concerns at this time.  Denies headache, no fever/chills/night sweats, no nausea/vomiting, no chest pain, no palpitations, no shortness of breath, no abdominal pain, no cough/congestion, no paresthesias.  No acute events overnight per nursing staff. ? ?Objective: ?Vitals:  ? 01/29/22 2109 01/30/22 0559 01/30/22 [id number] 01/30/22 0919  ?BP: (!) 177/94 (!) 187/89  (!) 186/89  ?Pulse: 75 73 72 72  ?Resp: 18  15 17   ?Temp: 99.2 ?F (37.3 ?C) 98.7 ?F (37.1 ?C)  97.9 ?F (36.6 ?C)  ?TempSrc: Oral Oral  Oral  ?SpO2: 95% 99% 96% 95%  ?Weight:  73.8 kg    ? ? ?Intake/Output Summary (Last 24 hours) at 01/30/2022 1131 ?Last data filed at 01/30/2022 0800 ?Gross per 24 hour  ?Intake 2679 ml  ?Output 400 ml  ?Net 2279 ml  ? ?Filed Weights  ? 01/27/22 0456 01/30/22 0559  ?  Weight: 69.2 kg 73.8 kg  ? ? ?Examination: ? ?Physical Exam: ?GEN: NAD, alert and oriented to place [facility]), but not time, person, or situation; chronically ill in appearance, appears older than stated age ?HEENT: NCAT, PERRL, EOMI, sclera clear,  MMM ?PULM: CTAB w/o wheezes/crackles, normal respiratory effort, on room air ?CV: RRR w/o M/G/R ?GI: abd soft, NTND, NABS, no R/G/M ?MSK: 2+ pitting edema bilateral lower extremities, muscle strength globally int

## 2022-01-31 LAB — GLUCOSE, CAPILLARY
Glucose-Capillary: 184 mg/dL — ABNORMAL HIGH (ref 70–99)
Glucose-Capillary: 182 mg/dL — ABNORMAL HIGH (ref 70–99)
Glucose-Capillary: 153 mg/dL — ABNORMAL HIGH (ref 70–99)
Glucose-Capillary: 164 mg/dL — ABNORMAL HIGH (ref 70–99)

## 2022-01-31 LAB — BASIC METABOLIC PANEL
Anion gap: 4 — ABNORMAL LOW (ref 5–15)
BUN: 23 mg/dL (ref 8–23)
CO2: 23 mmol/L (ref 22–32)
Calcium: 7.7 mg/dL — ABNORMAL LOW (ref 8.9–10.3)
Chloride: 110 mmol/L (ref 98–111)
Creatinine, Ser: 2.07 mg/dL — ABNORMAL HIGH (ref 0.61–1.24)
GFR, Estimated: 35 mL/min — ABNORMAL LOW (ref >60)
Glucose, Bld: 217 mg/dL — ABNORMAL HIGH (ref 70–99)
Potassium: 3.8 mmol/L (ref 3.5–5.1)
Sodium: 137 mmol/L (ref 135–145)

## 2022-01-31 LAB — MAGNESIUM: Magnesium: 1.8 mg/dL (ref 1.7–2.4)

## 2022-01-31 MED ORDER — INSULIN GLARGINE-YFGN 100 UNIT/ML ~~LOC~~ SOLN
8.0000 [IU] | Freq: Every day | SUBCUTANEOUS | Status: DC
  Administered 2022-02-01 – 2022-02-02 (×2): 8 [IU] via SUBCUTANEOUS
  Filled 2022-01-31 (×2): qty 0.08

## 2022-01-31 NOTE — Progress Notes (Addendum)
Occupational Therapy Treatment ?Patient Details ?Name: Manuel Kramer ?MRN: [id number] ?DOB: [birth year] ?Today's Date: 01/31/2022 ? ? ?History of present illness 66 y.o. M admitted on 01/25/22 due to confusion and BLE edema. Found to have C-diff and Acute Renal failure. PMH significant for DM2, HTN, CKD, CVA, depression/anxiety. ?  ?OT comments ? Pt with minimal progress towards OT goals today due to self-limiting behaviors. Pt declined EOB/OOB activities with OT due to "not feeling like it" despite education/encouragement. Pt later reported he had been on bedpan for 1 hour (appears he has been placing bedpan independently). Pt able to roll self without assist for bedpan removal, peri care at Total A. Educated on skin integrity concerns, need to notify staff when on bedpan, and encouraged BSC/bathroom use though pt again declined. Reinforced pt's long term goal to return home and need for incremental OOB activity to achieve that goal. Continue to rec return to SNF rehab.  ? ?Recommendations for follow up therapy are one component of a multi-disciplinary discharge planning process, led by the attending physician.  Recommendations may be updated based on patient status, additional functional criteria and insurance authorization. ?   ?Follow Up Recommendations ? Skilled nursing-short term rehab (<3 hours/day)  ?  ?Assistance Recommended at Discharge Intermittent Supervision/Assistance  ?Patient can return home with the following ? A little help with walking and/or transfers;A lot of help with bathing/dressing/bathroom ?  ?Equipment Recommendations ? None recommended by OT  ?  ?Recommendations for Other Services   ? ?  ?Precautions / Restrictions Precautions ?Precautions: Fall;Other (comment) ?Precaution Comments: bowel urgency ?Restrictions ?Weight Bearing Restrictions: No  ? ? ?  ? ?Mobility Bed Mobility ?Overal bed mobility: Needs Assistance ?Bed Mobility: Rolling ?Rolling: Supervision ?  ?  ?  ?  ?General bed mobility  comments: for removal and placement of bedrail ; declined EOB ?  ? ?Transfers ?  ?  ?  ?  ?  ?  ?  ?  ?  ?General transfer comment: refused ?  ?  ?Balance   ?  ?  ?  ?  ?  ?  ?  ?  ?  ?  ?  ?  ?  ?  ?  ?  ?  ?  ?   ? ?ADL either performed or assessed with clinical judgement  ? ?ADL Overall ADL's : Needs assistance/impaired ?Eating/Feeding: Set up;Sitting ?Eating/Feeding Details (indicate cue type and reason): requesting assist to open fruit cup despite encouragement to attempt prior to assist given ?  ?  ?  ?  ?  ?  ?  ?  ?  ?  ?  ?  ?Toileting- Clothing Manipulation and Hygiene: Total assistance;Bed level ?Toileting - Clothing Manipulation Details (indicate cue type and reason): reported being on bedpan after initiation of OT session; reports he has been on it for an hour; appears he placed himself on it - declined OOB BSC or bathroom assist despite encouragement. educated on skin concerns with prolonged bedpan use. Total A for hygiene with pt immediately experiencing gas and reporting need to be placed back on bedpan ?  ?  ?  ?General ADL Comments: Self limiting; educated on need for mobility if pt's goal is to return home. ?  ? ?Extremity/Trunk Assessment Upper Extremity Assessment ?Upper Extremity Assessment: Overall WFL for tasks assessed ?  ?Lower Extremity Assessment ?Lower Extremity Assessment: Defer to PT evaluation ?  ?  ?  ? ?Vision   ?Vision Assessment?: No apparent visual deficits ?  ?Perception   ?  ?  Praxis   ?  ? ?Cognition Arousal/Alertness: Awake/alert ?Behavior During Therapy: [facility] for tasks assessed/performed ?Overall Cognitive Status: No family/caregiver present to determine baseline cognitive functioning ?  ?  ?  ?  ?  ?  ?  ?  ?  ?  ?  ?  ?  ?  ?  ?  ?General Comments: Follows all simple commands, poor insight into deficits and how to achieve goal of returning home; self limiting ?  ?  ?   ?Exercises   ? ?  ?Shoulder Instructions   ? ? ?  ?General Comments    ? ? ?Pertinent Vitals/ Pain        Pain Assessment ?Pain Assessment: No/denies pain ? ?Home Living   ?  ?  ?  ?  ?  ?  ?  ?  ?  ?  ?  ?  ?  ?  ?  ?  ?  ?  ? ?  ?Prior Functioning/Environment    ?  ?  ?  ?   ? ?Frequency ? Min 2X/week  ? ? ? ? ?  ?Progress Toward Goals ? ?OT Goals(current goals can now be found in the care plan section) ? Progress towards OT goals: OT to reassess next treatment ? ?Acute Rehab OT Goals ?Patient Stated Goal: turn up tv volume; not get up today ?OT Goal Formulation: With patient ?Time For Goal Achievement: 02/09/22 ?Potential to Achieve Goals: Good ?ADL Goals ?Additional ADL Goal #1: Pt will complete all toileting tasks at mod I level, using RW for safety. ?Additional ADL Goal #2: Pt will tolerate 5 mins of standing ADL tasks to improve activity tolerance. ?Additional ADL Goal #3: Pt will complete a multistep pathfinding task with supervision.  ?Plan Discharge plan remains appropriate   ? ?Co-evaluation ? ? ?   ?  ?  ?  ?  ? ?  ?AM-PAC OT "6 Clicks" Daily Activity     ?Outcome Measure ? ? Help from another person eating meals?: A Little ?Help from another person taking care of personal grooming?: A Little ?Help from another person toileting, which includes using toliet, bedpan, or urinal?: Total ?Help from another person bathing (including washing, rinsing, drying)?: A Little ?Help from another person to put on and taking off regular upper body clothing?: A Little ?Help from another person to put on and taking off regular lower body clothing?: A Little ?6 Click Score: 16 ? ?  ?End of Session   ? ?OT Visit Diagnosis: Unsteadiness on feet (R26.81);Other abnormalities of gait and mobility (R26.89);Muscle weakness (generalized) (M62.81) ?  ?Activity Tolerance Other (comment) ?  ?Patient Left in bed;with call bell/phone within reach ?  ?Nurse Communication Other (comment) (discussed with NT) ?  ? ?   ? ?Time: 1210-1225 ?OT Time Calculation (min): 15 min ? ?Charges: OT General Charges ?$OT Visit: 1 Visit ?OT Treatments ?$Self  Care/Home Management : 8-22 mins ? ?Bradd Canary, OTR/L ?Acute Rehab Services ?Office: [phone]  ? ?Lorre Munroe ?01/31/2022, 12:43 PM ?

## 2022-01-31 NOTE — Progress Notes (Signed)
?PROGRESS NOTE ? ? ? ?Manuel Kramer  [id number] DOB: May 04, 1956 DOA: 01/25/2022 ?PCP: Vonna Drafts, FNP  ? ? ?Brief Narrative:  ?Manuel Kramer is a 66 year old male with past medical history significant for type 2 diabetes mellitus, essential hypertension, CKD stage IIIb, anemia of chronic kidney disease, depression/anxiety, chronic diarrhea, Hx ischemic CVA, with questionable cognitive impairment who presented to [facility] ED via EMS from [facility] SNF with confusion, worsening renal function and recent fall day prior.  Given patient is a poor historian with confusion, spouse at bedside contributes to the history; although she has very poor knowledge of his medical conditions and has not seen him regularly at the SNF.  Patient reports worsening lower extremity edema over the last 2 weeks and apparently nursing facility has increased his Lasix without improvement.  Labs from SNF with creatinine 2.6, baseline of 1.84 on 12/05/2021.  Also reports left hip pain and nursing home noted fall yesterday.  No other specific complaints or concerns at this time.  Denies headache, no dizziness, no chest pain, no palpitations, no shortness of breath, no cough/congestion, no nausea/vomiting, no abdominal pain, no weakness, no fatigue, no paresthesias.   ? ?In the ED, temperature 98.4 ?F, HR 74, RR 17, BP 201/99, SPO2 100% on room air.  WBC 15.9, hemoglobin 11.0, platelets 192.  Sodium 138, potassium 3.1, chloride 112, CO2 17, BUN 35, creatinine 3.18, glucose 106.  Chest x-ray with cardiomegaly with central vascular prominence without overt pulmonary edema or focal airspace consolidation.  EDP consulted [facility] for admission for acute on chronic renal failure. ?  ?  ?Assessment and Plan: ?* Acute renal failure superimposed on stage 3b chronic kidney disease ([place]) ?Patient presenting from SNF with elevated creatinine at facility of 2.6; and on presentation to the ED now up to 3.18.  Apparently SNF has been increasing his Lasix  due to progressive lower extremity edema.  Also patient's blood pressure very poorly controlled with poorly controlled diabetes likely contributing factor to progressive renal failure and volume loss with positive C. difficile colitis.  Renal ultrasound with no acute findings.  Urine sodium 22, urine creatinine 96; FeNa = 0.5%; which is consistent with prerenal. ?--Cr 3.18>3.10>3.09>3.05>2.59>2.36>2.26>2.07 ?--NS at 100 mL/h  ?--Avoid nephrotoxins, renally dose all medications ?--Strict I's and O's and daily weights ?--BMP daily ? ?Acute encephalopathy ?Patient with confusion, unable to discern who the president is or what the year is.  Per spouse at bedside, has been unable to answer these questions correctly for some time.  Patient with history of ischemic CVA, unclear if patient with cognitive impairment versus developing dementia.  Patient is afebrile, no concerning consolidation on chest x-ray, unlikely infectious etiology.  BUN only 35.  CT head without contrast negative for acute findings. ?--SLP evaluated for cognitive impairment, appears to be at baseline following recent stroke ?--Supportive care ? ?Bilateral lower extremity edema ?Patient with progressive lower extremity edema bilaterally.  Apparently nursing home has been increasing Lasix without improvement and now with worsening renal failure.  Chest x-ray with no overt pulmonary edema.  D-dimer elevated 2.04.  Vascular duplex ultrasound bilateral lower extremities negative for DVT, although limited study; but notable for enlarged lymph nodes in the groin.  CT abdomen/pelvis without contrast with diffuse wall thickening ascending and transverse colon, pericolic stranding adjacent to the ascending colon consistent with infectious colitis, no evidence of obstruction or pneumoperitoneum, no hydronephrosis, right inguinal hernia, enlarged lymph nodes inguinal regions suggestive of reactive hyperplasia. ?--TED hose, lower extremity elevation ? ?  C. difficile  colitis ?C. difficile PCR positive. ?--BM x 9 past past 24 hours (13 day prior) ?--Vancomycin 125 mg PO QID x 10 days ?--Enteric precautions ? ?Left hip pain ?Nursing home noted fall yesterday.  Patient complaining of left hip pain with active/passive range of motion.  Left hip/pelvis x-ray with no acute osseous abnormality. ?--PT/OT following, recommend return to SNF ? ?Type 2 diabetes mellitus with complication, with long-term current use of insulin ([place]) ?Hemoglobin A1c 5.2, well controlled.  Patient is prescribed Lantus 45 units [place] daily and Glyxambi, but review of SNF records appears that he has not been receiving insulin.  Hemoglobin A1c 5.2, well controlled. ?--Semglee 8u [place] daily ?--SSI for coverage ?--CBG before every meal/at bedtime ? ?Essential hypertension ?BP 201/99; uncontrolled.  On amlodipine 10 mg p.o. daily, carvedilol 25 mg p.o. twice daily, hydralazine 100 mg p.o. 3 times daily, clonidine 0.3mg  PO TID and spironolactone outpatient. ?--Holding spironolactone and furosemide due to AKI as above ?--Amlodipine 10 mg p.o. daily ?--Carvedilol 25 g p.o. BID ?--Hydralazine 100 mg p.o. TID ?--Clonidine 0.3 mg p.o. TID ?--Labetalol 20 mg IV every 4 hours PRN SBP >180 or DBP >110 ? ?Depression with anxiety ?--Continue sertraline 25 mg p.o. daily ? ?Hyperlipidemia ?--Continue atorvastatin 40 mg p.o. daily ? ?Anemia of chronic disease ?Hemoglobin 11.1, stable. ? ?Chronic diastolic CHF (congestive heart failure) ([place]) ?TTE 11/18/2021 with LVEF 60-65%, no regional wall motion normalities, grade 1 diastolic dysfunction, IVC normal.  Chest x-ray with no overt pulmonary edema. ?--Holding home Lasix due to worsening renal failure ?--Strict I's and O's and daily weights ? ?Physical deconditioning ?Patient has been at [facility] for several months ?--PT/OT following ?--TOC for assistance with discharge planning ? ?Hypokalemia ?Potassium 3.8 today ?--Repeat electrolytes in a.m. ? ? ? ?DVT prophylaxis: Place TED hose  Start: 01/27/22 1312 ?heparin injection 5,000 Units Start: 01/26/22 1500  Heparin ? ?  Code Status: Full Code ?Family Communication: No family present at bedside this morning ? ?Disposition Plan:  ?Level of care: Med-Surg ?Status is: Inpatient ?Remains inpatient appropriate because: Continues on IV fluid hydration, oral vancomycin for positive C. difficile colitis, anticipate discharge back to SNF once creatinine stabilizes and diarrhea improves; continues with 9 bowel movements over the past 24 hours ?  ? ?Consultants:  ?None ? ?Procedures:  ?Renal ultrasound ? ?Antimicrobials:  ?Oral vancomycin 3/30>> ? ? ?Subjective: ?Patient seen examined at bedside, resting comfortably.  Lying in bed getting cleaned up by the nurse tech.  Reports diarrhea slowly improving, 9 episodes of the last 24 hours which is improved from 13-day prior.  Continues on IV fluid hydration.  Creatinine continues to slowly improve.  No other specific questions or concerns at this time.  Denies headache, no fever/chills/night sweats, no nausea/vomiting, no chest pain, no palpitations, no shortness of breath, no abdominal pain, no cough/congestion, no paresthesias.  No acute events overnight per nursing staff. ? ?Objective: ?Vitals:  ? 01/30/22 1617 01/30/22 2013 01/31/22 0534 01/31/22 0851  ?BP: (!) 162/89 (!) 179/101 (!) 187/98 (!) 182/99  ?Pulse: 70 70 72 70  ?Resp: 18 18 18 18   ?Temp: 98.4 ?F (36.9 ?C) 98 ?F (36.7 ?C) 98 ?F (36.7 ?C) 98 ?F (36.7 ?C)  ?TempSrc:    Oral  ?SpO2: 92% 96% 100% 94%  ?Weight:      ? ? ?Intake/Output Summary (Last 24 hours) at 01/31/2022 1104 ?Last data filed at 01/31/2022 0900 ?Gross per 24 hour  ?Intake 4090.57 ml  ?Output 1000 ml  ?Net 3090.57 ml  ? ?  Filed Weights  ? 01/27/22 0456 01/30/22 0559  ?Weight: 69.2 kg 73.8 kg  ? ? ?Examination: ? ?Physical Exam: ?GEN: NAD, alert and oriented to place [facility]), but not time, person, or situation; chronically ill in appearance, appears older than stated age ?HEENT:  NCAT, PERRL, EOMI, sclera clear, MMM ?PULM: CTAB w/o wheezes/crackles, normal respiratory effort, on room air ?CV: RRR w/o M/G/R ?GI: abd soft, NTND, NABS, no R/G/M ?MSK: 2+ pitting edema bilateral lower

## 2022-01-31 NOTE — Progress Notes (Addendum)
PT Cancellation Note ? ?Patient Details ?Name: Manuel Kramer ?MRN: [id number] ?DOB: [birth year] ? ? ?Cancelled Treatment:    Reason Eval/Treat Not Completed: Patient declined, no reason specified. Pt stating "my nurse just moved me and that's enough." PT to re-attempt as time allows. ? ?1111 addendum: Re-attempted. Pt continues to refuse. ? ? ?Lorriane Shire ?01/31/2022, 9:13 AM ? ?Lorrin Goodell, PT  ?Office # [phone] ?Pager [phone] ? ?

## 2022-02-01 LAB — GLUCOSE, CAPILLARY
Glucose-Capillary: 165 mg/dL — ABNORMAL HIGH (ref 70–99)
Glucose-Capillary: 142 mg/dL — ABNORMAL HIGH (ref 70–99)
Glucose-Capillary: 189 mg/dL — ABNORMAL HIGH (ref 70–99)
Glucose-Capillary: 184 mg/dL — ABNORMAL HIGH (ref 70–99)

## 2022-02-01 LAB — BASIC METABOLIC PANEL
Anion gap: 5 (ref 5–15)
BUN: 21 mg/dL (ref 8–23)
CO2: 20 mmol/L — ABNORMAL LOW (ref 22–32)
Calcium: 8.1 mg/dL — ABNORMAL LOW (ref 8.9–10.3)
Chloride: 113 mmol/L — ABNORMAL HIGH (ref 98–111)
Creatinine, Ser: 2.02 mg/dL — ABNORMAL HIGH (ref 0.61–1.24)
GFR, Estimated: 36 mL/min — ABNORMAL LOW (ref >60)
Glucose, Bld: 172 mg/dL — ABNORMAL HIGH (ref 70–99)
Potassium: 3.8 mmol/L (ref 3.5–5.1)
Sodium: 138 mmol/L (ref 135–145)

## 2022-02-01 LAB — MAGNESIUM: Magnesium: 1.7 mg/dL (ref 1.7–2.4)

## 2022-02-01 MED ORDER — ISOSORBIDE MONONITRATE ER 30 MG PO TB24
30.0000 mg | ORAL_TABLET | Freq: Every day | ORAL | Status: DC
  Administered 2022-02-01 – 2022-02-02 (×2): 30 mg via ORAL
  Filled 2022-02-01 (×2): qty 1

## 2022-02-01 NOTE — Progress Notes (Signed)
? ? ? [facility] ?                                                                            ? ? ?Manuel Kramer, is a 66 y.o. male, DOB - [birth year], [id number] ?Admit date - 01/25/2022    ?Outpatient Primary MD for the patient is Vonna Drafts, FNP ? ?LOS - 7  days ? ? ? ?Brief summary  ?Patient is a 66 year old male with type 2 DM, HTN, CKD stage IIIb, anemia of chronic kidney disease, depression/anxiety, chronic diarrhea, Hx ischemic CVA, with questionable cognitive impairment presented to ED from SNF with confusion, worsening renal function and recent fall day prior.  Patient reported worsening lower extremity edema over the last 2 weeks and apparently nursing facility had increased his Lasix without improvement.  Labs from SNF with creatinine 2.6, baseline of 1.84 on 12/05/2021.  Also reports left hip pain and nursing home noted fall a day before the admission.    ?  ?In the ED, temperature 98.4 ?F, HR 74, RR 17, BP 201/99, SPO2 100% on room air.  WBC 15.9, hemoglobin 11.0, platelets 192.  Sodium 138, potassium 3.1, chloride 112, CO2 17, BUN 35, creatinine 3.18, glucose 106.  Chest x-ray with cardiomegaly with central vascular prominence without overt pulmonary edema or focal airspace consolidation.  TRH was consulted for admission. ? ? ?Assessment & Plan  ? ? ?Assessment and Plan: ?* Acute renal failure superimposed on stage 3b chronic kidney disease ([place]) ?-Presented from SNF with creatinine of 2.6, in ED noted to be 3.18.  Apparently SNF had been increasing his Lasix due to progressive lower extremity edema.  Poorly controlled hypertension and diabetes, volume loss with positive C. difficile colitis.   ?-Renal ultrasound with no acute findings.   ?-Continue IV fluid hydration, creatinine improved to 2.0, close to his baseline ? ?Acute encephalopathy ?-On admission, patient was noted to be confused and disoriented. ?- Per spouse at bedside, has been unable to answer these questions correctly  for some time.  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 ?-CT head negative, chest x-ray showed no consolidation or pneumonia. ?-Cognitive deficit was noted by speech therapy during evaluation and recommended skilled rehab ? ?Bilateral lower extremity edema ?- Venous Dopplers negative for DVT.  Chest x-ray showed no overt pulmonary edema ?- CT abdomen pelvis showed diffuse wall thickening ascending transverse colon,  pericolic stranding adjacent to the ascending colon consistent with infectious colitis ?- continue TED hose, lower extremity elevation ? ?C. difficile colitis ?-CT abdomen pelvis consistent with infectious colitis, C. difficile PCR  ?-- Continue vancomycin 125 mg PO QID x 10 days ?--Enteric precautions ?-Per patient diarrhea improving ? ?Left hip pain ?-Noted to have a fall day before admission at the SNF.   ?-  Left hip/pelvis x-ray with no acute osseous abnormality. ?--PT/OT following, recommend return to SNF ? ?Type 2 diabetes mellitus with complication, with long-term current use of insulin ([place]) ?- Patient is prescribed Lantus 45 units [place] daily and Glyxambi, but review of SNF records appears that he has not been receiving insulin.   ?-Hemoglobin A1c 5.2, well controlled. ?-Continue Semglee 8  units daily, SSI ? ? ?Essential hypertension ?-On admission, BP 201/99; uncontrolled.   ?-Continue amlodipine 10 mg daily, Coreg 25 mg twice daily, hydralazine 100 mg 3 times daily, clonidine 0.3 mg 3 times daily  ?-Spironolactone, Lasix held due to AKI  ?-BP still uncontrolled, started on Imdur 30 mg daily ? ?Depression with anxiety ?-- Continue sertraline ? ?Hyperlipidemia ?-- Continue Lipitor ? ?Anemia of chronic disease ?Hemoglobin 10.0 on 4/1, has remained stable ? ?Chronic diastolic CHF (congestive heart failure) (HCC) ?-2D echo in 10/2021 showed LVEF 60-65%, no regional wall motion normalities, grade 1 diastolic dysfunction, IVC normal.   ?- Chest x-ray with no overt pulmonary edema. ?-- Currently euvolemic, Lasix has been held due to AKI ? ?Physical deconditioning ?Patient has been at [facility] for several months ?--PT/OT following ?--TOC for assistance with discharge planning ? ?Hypokalemia ?-Replete as needed ? ?Obesity ?Estimated body mass index is 31.94 kg/m? as calculated from the following: ?  Height as of 12/28/21: 5\' 2"  (1.575 m). ?  Weight as of this encounter: 79.2 kg. ? ?Code Status: Full CODE STATUS ?DVT Prophylaxis:  Place TED hose Start: 01/27/22 1312 ?heparin injection 5,000 Units Start: 01/26/22 1500 ? ? ?Level of Care: Level of care: Med-Surg ?Family Communication:  ?Disposition Plan:     Remains inpatient appropriate: Closer to discharge, patient is now declining to go back to his skilled nursing facility.   ? ?Procedures:  ?Renal ultrasound ? ?Consultants:   ?None ? ?Antimicrobials:  ?Oral vancomycin 125 mg 4 times daily started on 01/26/2022--- 02/05/2022 ? ? ?Medications ? ? amLODipine  10 mg Oral Daily  ? atorvastatin  40 mg Oral Daily  ? carvedilol  25 mg Oral BID WC  ? cloNIDine  0.3 mg Oral TID  ? Gerhardt's butt cream   Topical BID  ? heparin injection (subcutaneous)  5,000 Units Subcutaneous Q8H  ? hydrALAZINE  100 mg Oral Q8H  ? insulin aspart  0-5 Units Subcutaneous QHS  ? insulin aspart  0-9 Units Subcutaneous TID WC  ? insulin glargine-yfgn  8 Units Subcutaneous Daily  ? sertraline  25 mg Oral Daily  ? vancomycin  125 mg Oral QID  ? ? ? ?Subjective:  ? ?Charon Brandes was seen and examined today.  States overall diarrhea is improving, had 1 BM last night.  No chest pain, shortness of breath, BP still somewhat uncontrolled.  ? ?Objective:  ? ?Vitals:  ? 02/01/22 0446 02/01/22 0813 02/01/22 0813 02/01/22 1018  ?BP: (!) 196/97 (!) 190/98 (!) 190/98 (!) 171/92  ?Pulse: 73  75 70  ?Resp: 18  16   ?Temp: 98 ?F (36.7 ?C)  98.3 ?F (36.8 ?C)   ?TempSrc:      ?SpO2: 96%  98%   ?Weight: 79.2 kg     ? ? ?Intake/Output Summary (Last  24 hours) at 02/01/2022 1615 ?Last data filed at 02/01/2022 1300 ?Gross per 24 hour  ?Intake 3394.86 ml  ?Output 1550 ml  ?Net 1844.86 ml  ? ?Page Weights  ? 01/27/22 0456 01/30/22 0559 02/01/22 0446  ?Weight: 69.2 kg 73.8 kg 79.2 kg  ? ? ? ?Exam ?General: Alert and oriented, ill-appearing ?Cardiovascular: S1 S2 auscultated, no murmurs, RRR ?Respiratory: Clear to auscultation bilaterally, no wheezing ?Gastrointestinal: Soft, nontender, nondistended, + bowel sounds ?Ext: 2+ pedal edema bilaterally ?Psych: Normal affect and demeanor ? ? ?Data Reviewed:  I have personally reviewed following labs  ? ? ?CBC ?Lab Results  ?Component Value Date  ? WBC 8.3 01/28/2022  ? RBC  3.23 (L) 01/28/2022  ? HGB 10.0 (L) 01/28/2022  ? HCT 29.9 (L) 01/28/2022  ? MCV 92.6 01/28/2022  ? MCH 31.0 01/28/2022  ? PLT 215 01/28/2022  ? MCHC 33.4 01/28/2022  ? RDW 13.4 01/28/2022  ? LYMPHSABS 0.8 01/25/2022  ? MONOABS 1.8 (H) 01/25/2022  ? EOSABS 0.1 01/25/2022  ? BASOSABS 0.0 01/25/2022  ? ? ? ?Last metabolic panel ?Lab Results  ?Component Value Date  ? NA 138 02/01/2022  ? K 3.8 02/01/2022  ? CL 113 (H) 02/01/2022  ? CO2 20 (L) 02/01/2022  ? BUN 21 02/01/2022  ? CREATININE 2.02 (H) 02/01/2022  ? GLUCOSE 172 (H) 02/01/2022  ? GFRNONAA 36 (L) 02/01/2022  ? GFRAA >60 11/20/2018  ? CALCIUM 8.1 (L) 02/01/2022  ? PHOS 3.4 11/29/2021  ? PROT 5.3 (L) 01/25/2022  ? ALBUMIN 2.5 (L) 01/25/2022  ? BILITOT 0.6 01/25/2022  ? ALKPHOS 73 01/25/2022  ? AST 28 01/25/2022  ? ALT 36 01/25/2022  ? ANIONGAP 5 02/01/2022  ? ? ?CBG (last 3)  ?Recent Labs  ?  01/31/22 ?2125 02/01/22 ?0722 02/01/22 ?1139  ?GLUCAP 164* 165* 142*  ?  ? ? ? ? ? ? ?Estill Cotta M.D. ?[facility] ?02/01/2022, 4:15 PM ? ?Available via Epic secure chat 7am-7pm ?After 7 pm, please refer to night coverage provider listed on amion. ? ?  ?

## 2022-02-01 NOTE — Progress Notes (Signed)
Physical Therapy Treatment ?Patient Details ?Name: Manuel Kramer ?MRN: [id number] ?DOB: [birth year] ?Today's Date: 02/01/2022 ? ? ?History of Present Illness 66 y.o. M admitted on 01/25/22 due to confusion and BLE edema. Found to have C-diff and Acute Renal failure. PMH significant for DM2, HTN, CKD, CVA, depression/anxiety. ? ?  ?PT Comments  ? ? Continuing work on functional mobility and activity tolerance;  initially planned on walking this session, however pt's condom cath was too uncomfortable to be able to walk; Still, he was able to stand at bedside to allow for adjusting RW to optimal height; finished in bed; notified RN of uncomfortable condom cath; will continue to follow while in-hospital  ?Recommendations for follow up therapy are one component of a multi-disciplinary discharge planning process, led by the attending physician.  Recommendations may be updated based on patient status, additional functional criteria and insurance authorization. ? ?Follow Up Recommendations ? Skilled nursing-short term rehab (<3 hours/day) ?  ?  ?Assistance Recommended at Discharge Frequent or constant Supervision/Assistance  ?Patient can return home with the following   ?  ?Equipment Recommendations ? None recommended by PT  ?  ?Recommendations for Other Services   ? ? ?  ?Precautions / Restrictions Precautions ?Precautions: Fall;Other (comment) ?Precaution Comments: bowel urgency ?Restrictions ?Weight Bearing Restrictions: No  ?  ? ?Mobility ? Bed Mobility ?Overal bed mobility: Needs Assistance ?Bed Mobility: Supine to Sit ?  ?  ?Supine to sit: Mod assist, HOB elevated ?Sit to supine: Mod assist ?  ?General bed mobility comments: Mod assist and heavy use of pad to help pt turn towards EOB, and elevate trunk to sit ?  ? ?Transfers ?Overall transfer level: Needs assistance ?Equipment used: Rolling walker (2 wheels) ?Transfers: Sit to/from Stand ?Sit to Stand: Mod assist, From elevated surface ?  ?  ?  ?  ?  ?General transfer  comment: Mod assist to power up to stand; once up, adjusted height of RW for optimal fit; condom cath contiued to be very uncomfortable, and pt requesting to sit ?  ? ?Ambulation/Gait ?  ?  ?  ?  ?  ?  ?  ?General Gait Details: discomfort and pain at condom cath made walking intolerable fo rpt ? ? ?Stairs ?  ?  ?  ?  ?  ? ? ?Wheelchair Mobility ?  ? ?Modified Rankin (Stroke Patients Only) ?  ? ? ?  ?Balance   ?  ?Sitting balance-Leahy Scale: Good ?  ?  ?  ?Standing balance-Leahy Scale: Poor ?  ?  ?  ?  ?  ?  ?  ?  ?  ?  ?  ?  ?  ? ?  ?Cognition Arousal/Alertness: Awake/alert ?Behavior During Therapy: [facility] for tasks assessed/performed ?Overall Cognitive Status: No family/caregiver present to determine baseline cognitive functioning ?  ?  ?  ?  ?  ?  ?  ?  ?  ?  ?  ?  ?  ?  ?  ?  ?General Comments: Follows all simple commands, poor insight into deficits and how to achieve goal of returning home; self limiting ?  ?  ? ?  ?Exercises   ? ?  ?General Comments   ?  ?  ? ?Pertinent Vitals/Pain Pain Assessment ?Pain Assessment: Faces ?Faces Pain Scale: Hurts whole lot ?Pain Location: Condom cath ?Pain Descriptors / Indicators: Discomfort, Sore, Tender ?Pain Intervention(s): Monitored during session (attempted repositioning; Notified RN)  ? ? ?Home Living   ?  ?  ?  ?  ?  ?  ?  ?  ?  ?   ?  ?  Prior Function    ?  ?  ?   ? ?PT Goals (current goals can now be found in the care plan section) Acute Rehab PT Goals ?Patient Stated Goal: home ?PT Goal Formulation: With patient ?Time For Goal Achievement: 02/10/22 ?Potential to Achieve Goals: Good ?Progress towards PT goals: Progressing toward goals (slowly) ? ?  ?Frequency ? ? ? Min 2X/week ? ? ? ?  ?PT Plan Current plan remains appropriate  ? ? ?Co-evaluation   ?  ?  ?  ?  ? ?  ?AM-PAC PT "6 Clicks" Mobility   ?Outcome Measure ? Help needed turning from your back to your side while in a flat bed without using bedrails?: A Little ?Help needed moving from lying on your back to sitting  on the side of a flat bed without using bedrails?: A Lot ?Help needed moving to and from a bed to a chair (including a wheelchair)?: A Lot ?Help needed standing up from a chair using your arms (e.g., wheelchair or bedside chair)?: A Lot ?Help needed to walk in hospital room?: A Lot ?Help needed climbing 3-5 steps with a railing? : Total ?6 Click Score: 12 ? ?  ?End of Session   ?Activity Tolerance: Patient limited by pain ?Patient left: in bed;with call bell/phone within reach;with bed alarm set (with lunch setup) ?Nurse Communication: Mobility status;Other (comment) (adn please check condom cath) ?PT Visit Diagnosis: Difficulty in walking, not elsewhere classified (R26.2) ?  ? ? ?Time: [id number] ?PT Time Calculation (min) (ACUTE ONLY): 28 min ? ?Charges:  $Therapeutic Activity: 23-37 mins          ?          ? ?Roney Marion, PT  ?Acute Rehabilitation Services ?Pager [phone] ?Office [phone] ? ? ? ?[place] ?02/01/2022, 1:22 PM ? ?

## 2022-02-01 NOTE — TOC Progression Note (Signed)
Transition of Care (TOC) - Initial/Assessment Note  ? ? ?Patient Details  ?Name: Manuel Kramer ?MRN: [id number] ?Date of Birth: 1956-08-19 ? ?Transition of Care (TOC) CM/SW Contact:    ?Paulene Floor Ramisa Duman, LCSWA ?Phone Number: ?02/01/2022, 11:41 AM ? ?Clinical Narrative:                 ?CSW attempted to contact the patient to discuss discharge planning there was no answer. ? ?CSW contacted the patient's son and was informed that the family cannot care for the patient and the patient does not have a home to go back to.  The son will contact the patient and remind the patient that the family is still pursuing LTC for the patient.  ? ?  ?  ? ? ?Patient Goals and CMS Choice ?  ?  ?  ? ?Expected Discharge Plan and Services ?  ?  ?  ?  ?  ?                ?  ?  ?  ?  ?  ?  ?  ?  ?  ?  ? ?Prior Living Arrangements/Services ?  ?  ?  ?       ?  ?  ?  ?  ? ?Activities of Daily Living ?  ?  ? ?Permission Sought/Granted ?  ?  ?   ?   ?   ?   ? ?Emotional Assessment ?  ?  ?  ?  ?  ?  ? ?Admission diagnosis:  AKI (acute kidney injury) ([place]) [N17.9] ?Acute renal failure superimposed on stage 3b chronic kidney disease ([place]) [N17.9, N18.32] ?Patient Active Problem List  ? Diagnosis Date Noted  ? C. difficile colitis 01/26/2022  ? Hypokalemia 01/26/2022  ? Acute renal failure superimposed on stage 3b chronic kidney disease ([place]) 01/25/2022  ? Bilateral lower extremity edema 01/25/2022  ? Type 2 diabetes mellitus with complication, with long-term current use of insulin ([place]) 01/25/2022  ? Depression with anxiety 01/25/2022  ? Physical deconditioning 01/25/2022  ? Left hip pain 01/25/2022  ? Anemia of chronic disease 01/25/2022  ? Chronic diastolic CHF (congestive heart failure) ([place]) 01/25/2022  ? Cerebral embolism with cerebral infarction 11/17/2021  ? Protein-calorie malnutrition, severe 11/16/2021  ? Acute encephalopathy 11/14/2021  ? AKI (acute kidney injury) ([place])   ? Hyperglycemia   ? Hypertensive emergency without congestive heart  failure   ? Sepsis ([place])   ? Hyperlipidemia 05/22/2016  ? Diabetic neuropathy ([place]) 05/22/2016  ? Other specified diabetes mellitus without complications ([place]) [id number]  ? Essential hypertension 10/02/2014  ? Smoking 10/02/2014  ? Family history of prostate cancer 10/02/2014  ? Renal insufficiency 10/02/2014  ? ?PCP:  Vonna Drafts, FNP ?Pharmacy:   ?[facility] [employer], [address] ?[place] [address] ?Phone: [phone] Fax: [phone] ? ?[place], [place] - [address] ?[address] ?[place] [address] ?Phone: [phone] Fax: [phone] ? ?[id number] [place] [address] ?[place] [address] ?Phone: [phone] Fax: [phone] ? ? ? ? ?Social Determinants of Health (SDOH) Interventions ?  ? ?Readmission Risk Interventions ?   ? View : No data to display.  ?  ?  ?  ? ? ? ?

## 2022-02-02 ENCOUNTER — Other Ambulatory Visit: Payer: Self-pay

## 2022-02-02 LAB — GLUCOSE, CAPILLARY
Glucose-Capillary: 151 mg/dL — ABNORMAL HIGH (ref 70–99)
Glucose-Capillary: 169 mg/dL — ABNORMAL HIGH (ref 70–99)
Glucose-Capillary: 166 mg/dL — ABNORMAL HIGH (ref 70–99)

## 2022-02-02 MED ORDER — ISOSORBIDE MONONITRATE ER 30 MG PO TB24: 30.0000 mg | ORAL_TABLET | Freq: Every day | ORAL | Status: DC

## 2022-02-02 MED ORDER — VANCOMYCIN HCL 125 MG PO CAPS
125.0000 mg | ORAL_CAPSULE | Freq: Four times a day (QID) | ORAL | 0 refills | Status: AC
  Filled 2022-02-02: qty 12, 3d supply, fill #0

## 2022-02-02 MED ORDER — CLONIDINE HCL 0.3 MG PO TABS: 0.3000 mg | ORAL_TABLET | Freq: Three times a day (TID) | ORAL | Status: AC

## 2022-02-02 MED ORDER — SERTRALINE HCL 25 MG PO TABS: 25.0000 mg | ORAL_TABLET | Freq: Every day | ORAL | Status: DC

## 2022-02-02 MED ORDER — INSULIN GLARGINE 100 UNIT/ML SOLOSTAR PEN: 8.0000 [IU] | PEN_INJECTOR | Freq: Every day | SUBCUTANEOUS | 11 refills | Status: DC

## 2022-02-02 MED ORDER — GERHARDT'S BUTT CREAM: 1.0000 "application " | TOPICAL_CREAM | Freq: Two times a day (BID) | CUTANEOUS | Status: DC

## 2022-02-02 NOTE — Discharge Summary (Signed)
?Physician Discharge Summary ?  ?Patient: Manuel Kramer MRN: [id number] DOB: [birth year]  ?Admit date:     01/25/2022  ?Discharge date: 02/02/22  ?Discharge Physician: Lilli Dewald  ? ?PCP: Vonna Drafts, FNP  ? ?Recommendations at discharge:  ? ?Continue oral vancomycin 125 mg p.o. 4 times daily for 3 more days ? ?Discharge Diagnoses: ? ?  Acute renal failure superimposed on stage 3b chronic kidney disease ([place]) ?  Acute encephalopathy ?  Bilateral lower extremity edema ?  C. difficile colitis ?  Left hip pain ?  Type 2 diabetes mellitus with complication, with long-term current use of insulin ([place]) ?  Essential hypertension, uncontrolled ?  Depression with anxiety ?  Hyperlipidemia ?  Anemia of chronic disease ?  Chronic diastolic CHF (congestive heart failure) ([place]) ?  Physical deconditioning ?  Hypokalemia ? ? ? ?Hospital Course: ?Patient is a 66 year old male with type 2 DM, HTN, CKD stage IIIb, anemia of chronic kidney disease, depression/anxiety, chronic diarrhea, Hx ischemic CVA, with questionable cognitive impairment presented to ED from SNF with confusion, worsening renal function and recent fall day prior.  Patient reported worsening lower extremity edema over the last 2 weeks and apparently nursing facility had increased his Lasix without improvement.  Labs from SNF with creatinine 2.6, baseline of 1.84 on 12/05/2021.  Also reports left hip pain and nursing home noted fall a day before the admission.    ?  ?In the ED, temperature 98.4 ?F, HR 74, RR 17, BP 201/99, SPO2 100% on room air.  WBC 15.9, hemoglobin 11.0, platelets 192.  Sodium 138, potassium 3.1, chloride 112, CO2 17, BUN 35, creatinine 3.18, glucose 106.  Chest x-ray with cardiomegaly with central vascular prominence without overt pulmonary edema or focal airspace consolidation.   ?Patient was admitted for further work-up ? ?Assessment and Plan: ? ?Acute renal failure superimposed on stage 3b chronic kidney disease ([place]) ?-Presented from SNF  with creatinine of 2.6, in ED noted to be 3.18.  Apparently SNF had been increasing his Lasix due to progressive lower extremity edema.  Poorly controlled hypertension and diabetes, volume loss with positive C. difficile colitis.   ?-Renal ultrasound with no acute findings.   ?-Patient was placed on IV fluid hydration, creatinine improved to 2.0, close to his baseline ?  ?Acute encephalopathy ?-On admission, patient was noted to be confused and disoriented. ?- Per spouse at bedside, has been unable to answer these questions correctly for some time.  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 ?-CT head negative, chest x-ray showed no consolidation or pneumonia. ?-Cognitive deficit was noted by speech therapy during evaluation and recommended skilled rehab ?  ?Bilateral lower extremity edema ?- Venous Dopplers negative for DVT.  Chest x-ray showed no overt pulmonary edema ?- CT abdomen pelvis showed diffuse wall thickening ascending transverse colon,  pericolic stranding adjacent to the ascending colon consistent with infectious colitis ?- continue TED hose, lower extremity elevation ?  ?C. difficile colitis ?-CT abdomen pelvis consistent with infectious colitis, C. difficile PCR positive ?-- Continue vancomycin 125 mg PO QID for total of 10 days  ?-- Diarrhea is improving. ?  ?Left hip pain ?-Noted to have a fall day before admission at the SNF.   ?-  Left hip/pelvis x-ray with no acute osseous abnormality. ?--PT/OT following, recommend return to SNF ?  ?Type 2 diabetes mellitus with complication, with long-term current use of insulin ([place]) ?- Patient is prescribed Lantus  45 units [place] daily and Januvia but review of SNF records appears that he has not been receiving insulin.   ?-Hemoglobin A1c 5.2, well controlled. ?-Continue Semglee 8 units daily, sliding scale insulin ?  ?  ?Essential hypertension ?-On admission, BP 201/99; uncontrolled.   ?-Continue  amlodipine 10 mg daily, Coreg 25 mg twice daily, hydralazine 100 mg 3 times daily, clonidine 0.3 mg 3 times daily  ?-Lasix held due to AKI  ?-Started on Imdur 30 mg daily, can titrate up to 60 mg daily ?  ?Depression with anxiety ?-- Continue sertraline ?  ?Hyperlipidemia ?-- Continue Lipitor ?  ?Anemia of chronic disease ?Hemoglobin 10.0 on 4/1, has remained stable ?  ?Chronic diastolic CHF (congestive heart failure) (HCC) ?-2D echo in 10/2021 showed LVEF 60-65%, no regional wall motion normalities, grade 1 diastolic dysfunction, IVC normal.  ?- Chest x-ray with no overt pulmonary edema. ?-- Currently euvolemic, Lasix has been held due to AKI ?  ?Physical deconditioning ?Patient has been at [facility] for several months ?--PT/OT recommended skilled nursing facility rec ?  ?Hypokalemia ?-Replete as needed ? ?History of prior ischemic CVA ?-Continue aspirin and Plavix, statin ?  ?Obesity ?Estimated body mass index is 31.94 kg/m? as calculated from the following: ?  Height as of 12/28/21: 5\' 2"  (1.575 m). ?  Weight as of this encounter: 79.2 kg. ? ? ? ?Pain control - [employer] Controlled Substance Reporting System database was reviewed. and patient was instructed, not to drive, operate heavy machinery, perform activities at heights, swimming or participation in water activities or provide baby-sitting services while on Pain, Sleep and Anxiety Medications; until their outpatient Physician has advised to do so again. Also recommended to not to take more than prescribed Pain, Sleep and Anxiety Medications.  ?Consultants: None  ?procedures performed: Renal ultrasound ?Disposition: Skilled nursing facility ?Diet recommendation:  ?Discharge Diet Orders (From admission, onward)  ? ?  Start     Ordered  ?    02/02/22 1030  ? 02/02/22 0000  Diet Carb Modified       ? 02/02/22 1030  ? ?  ?  ? ?  ? ?Carb modified diet ?DISCHARGE MEDICATION: ?Allergies as of 02/02/2022   ? ?   Reactions  ? Codeine Other (See Comments)  ? On  MAR, unknown reaction  ? Iodinated Contrast [place] Other (See Comments)  ? On MAR, unknown reaction  ? Pentazocine Other (See Comments)  ? On MAR, unknown reaction  ? Statins Other (See Comments)  ? On MAR, unknown reaction  ? ?  ? ?  ?Medication List  ?  ? ?STOP taking these medications   ? ?allopurinol 300 MG tablet ?Commonly known as: ZYLOPRIM ?  ?furosemide 40 MG tablet ?Commonly known as: Lasix ?  ?loperamide 2 MG capsule ?Commonly known as: IMODIUM ?  ?sitaGLIPtin 50 MG tablet ?Commonly known as: JANUVIA ?  ? ?  ? ?TAKE these medications   ? ?acetaminophen 500 MG tablet ?Commonly known as: TYLENOL ?Take 1 tablet (500 mg total) by mouth every 6 (six) hours as needed. ?  ?amLODipine 10 MG tablet ?Commonly known as: NORVASC ?Take 1 tablet (10 mg total) by mouth daily. ?  ?aspirin EC 81 MG tablet ?Take 81 mg by mouth daily. Swallow whole. ?  ?atorvastatin 40 MG tablet ?Commonly known as: LIPITOR ?Take 1 tablet (40 mg total) by mouth daily. ?  ?carvedilol 25 MG tablet ?Commonly known as: COREG ?Take 1 tablet (25 mg total) by mouth 2 (two) times  daily with a meal. ?  ?cholecalciferol 25 MCG (1000 UNIT) tablet ?Commonly known as: VITAMIN D3 ?Take 1,000 Units by mouth daily. ?  ?Cinnamon 500 MG capsule ?Take 500 mg by mouth daily. ?  ?cloNIDine 0.3 MG tablet ?Commonly known as: CATAPRES ?Take 1 tablet (0.3 mg total) by mouth 3 (three) times daily. ?What changed: Another medication with the same name was removed. Continue taking this medication, and follow the directions you see here. ?  ?clopidogrel 75 MG tablet ?Commonly known as: PLAVIX ?Take 75 mg by mouth daily. ?  ?gabapentin 100 MG capsule ?Commonly known as: NEURONTIN ?Take 100 mg by mouth at bedtime. ?  ?Garlic [id number] MG Caps ?Take 1 capsule by mouth daily. ?  ?Gerhardt's butt cream Crea ?Apply 1 application. topically 2 (two) times daily. ?  ?hydrALAZINE 100 MG tablet ?Commonly known as: APRESOLINE ?Take 1 tablet (100 mg total) by mouth every 8 (eight) hours. ?   ?insulin glargine 100 UNIT/ML Solostar Pen ?Commonly known as: LANTUS ?Inject 8 Units into the skin at bedtime. ?What changed: how much to take ?  ?ipratropium 0.06 % nasal spray ?Commonly known as: AT

## 2022-02-02 NOTE — TOC Transition Note (Signed)
Transition of Care ? Patient will DC to: [name] ?Anticipated DC date: 02/02/2022 ?Family notified: Yes ?Transport by: Sharin Mons ? ? ?Per MD patient ready for DC to SNF. RN to call report prior to discharge [phone] room 217. RN, patient, patient's family, and facility notified of DC. Discharge Summary and FL2 sent to facility. DC packet on chart. Ambulance transport will be requested for patient.  ? ?CSW will sign off for now as social work intervention is no longer needed. Please consult [place] again if new needs arise. ? ? ? (TOC) - CM/SW Discharge Note ? ? ?Patient Details  ?Name: Manuel Kramer ?MRN: [id number] ?Date of Birth: 02/28/1956 ? ?Transition of Care (TOC) CM/SW Contact:  ?Catalina Pizza Alyx Mcguirk, LCSWA ?Phone Number: ?02/02/2022, 10:46 AM ? ? ?Clinical Narrative:    ? ? ? ?Final next level of care: Skilled Nursing Facility ?  ? ? ?Patient Goals and CMS Choice ?  ?  ?  ? ?Discharge Placement ?  ?           ?Patient chooses bed at: [facility] ?Patient to be transferred to facility by: [facility] ?Name of family member notified: Goro, Wenrick Daughter     [phone] ?Patient and family notified of of transfer: 02/02/22 ? ?Discharge Plan and Services ?  ?  ?           ?  ?  ?  ?  ?  ?  ?  ?  ?  ?  ? ?Social Determinants of Health (SDOH) Interventions ?  ? ? ?Readmission Risk Interventions ?   ? View : No data to display.  ?  ?  ?  ? ? ? ? ? ?

## 2022-02-02 NOTE — TOC Progression Note (Addendum)
Transition of Care (TOC) - Initial/Assessment Note  ? ? ?Patient Details  ?Name: Manuel Kramer ?MRN: [id number] ?Date of Birth: [birth year] ? ?Transition of Care (TOC) CM/SW Contact:    ?Manuel Kramer, LCSWA ?Phone Number: ?02/02/2022, 10:15 AM ? ?Clinical Narrative:                 ?CSW received notification that the MD that patient is refusing to go to SNF.  CSW spoke with the patient's daughter,  ?Manuel Kramer Daughter    [phone].   ? ? The daughter reports that the family has told the patient that he must go to the facility and there are no other options and asked that CSW call transportation and send patient to the facility.  Ms. Manuel Kramer reports that she works at an [employer], but will be available after 1500 for the nursing staff to call her when [facility] arrives to speak with the patient should patient refuse to be transported.   ? ?CSW verified that the facility can accept the patient today. ? ?Pending: d/c summary ? ? ?  ?  ? ? ?Patient Goals and CMS Choice ?  ?  ?  ? ?Expected Discharge Plan and Services ?  ?  ?  ?  ?  ?                ?  ?  ?  ?  ?  ?  ?  ?  ?  ?  ? ?Prior Living Arrangements/Services ?  ?  ?  ?       ?  ?  ?  ?  ? ?Activities of Daily Living ?  ?  ? ?Permission Sought/Granted ?  ?  ?   ?   ?   ?   ? ?Emotional Assessment ?  ?  ?  ?  ?  ?  ? ?Admission diagnosis:  AKI (acute kidney injury) ([place]) [N17.9] ?Acute renal failure superimposed on stage 3b chronic kidney disease ([place]) [N17.9, N18.32] ?Patient Active Problem List  ? Diagnosis Date Noted  ? C. difficile colitis 01/26/2022  ? Hypokalemia 01/26/2022  ? Acute renal failure superimposed on stage 3b chronic kidney disease ([place]) 01/25/2022  ? Bilateral lower extremity edema 01/25/2022  ? Type 2 diabetes mellitus with complication, with long-term current use of insulin ([place]) 01/25/2022  ? Depression with anxiety 01/25/2022  ? Physical deconditioning 01/25/2022  ? Left hip pain 01/25/2022  ? Anemia of chronic disease 01/25/2022  ?  Chronic diastolic CHF (congestive heart failure) ([place]) 01/25/2022  ? Cerebral embolism with cerebral infarction 11/17/2021  ? Protein-calorie malnutrition, severe 11/16/2021  ? Acute encephalopathy 11/14/2021  ? AKI (acute kidney injury) ([place])   ? Hyperglycemia   ? Hypertensive emergency without congestive heart failure   ? Sepsis ([place])   ? Hyperlipidemia 05/22/2016  ? Diabetic neuropathy ([place]) 05/22/2016  ? Other specified diabetes mellitus without complications ([place]) [id number]  ? Essential hypertension 10/02/2014  ? Smoking 10/02/2014  ? Family history of prostate cancer 10/02/2014  ? Renal insufficiency 10/02/2014  ? ?PCP:  Vonna Drafts, FNP ?Pharmacy:   ?[facility] [employer], [address] ?[place] [address] ?Phone: [phone] Fax: [phone] ? ?[place], [place] - [address] ?[address] ?[place] [address] ?Phone: [phone] Fax: [phone] ? ?[id number] - [place] ?[place] [address] ?Phone:  [phone] Fax: [phone] ? ? ? ? ?Social Determinants of Health (SDOH) Interventions ?  ? ?Readmission Risk Interventions ?   ? View : No data to display.  ?  ?  ?  ? ? ? ?

## 2022-02-03 ENCOUNTER — Other Ambulatory Visit: Payer: Self-pay

## 2022-02-10 ENCOUNTER — Other Ambulatory Visit: Payer: Self-pay

## 2022-02-20 ENCOUNTER — Emergency Department (HOSPITAL_COMMUNITY)
Admission: EM | Admit: 2022-02-20 | Discharge: 2022-02-21 | Disposition: A | Discharge status: Skilled Nursing Facility | Payer: Medicare Other | Attending: Emergency Medicine | Admitting: Emergency Medicine

## 2022-02-20 ENCOUNTER — Emergency Department (HOSPITAL_COMMUNITY): Payer: Medicare Other

## 2022-02-20 DIAGNOSIS — I129 Hypertensive chronic kidney disease with stage 1 through stage 4 chronic kidney disease, or unspecified chronic kidney disease: Secondary | ICD-10-CM | POA: Diagnosis not present

## 2022-02-20 DIAGNOSIS — A0472 Enterocolitis due to Clostridium difficile, not specified as recurrent: Secondary | ICD-10-CM | POA: Insufficient documentation

## 2022-02-20 DIAGNOSIS — R739 Hyperglycemia, unspecified: Secondary | ICD-10-CM

## 2022-02-20 DIAGNOSIS — Z7982 Long term (current) use of aspirin: Secondary | ICD-10-CM | POA: Diagnosis not present

## 2022-02-20 DIAGNOSIS — N189 Chronic kidney disease, unspecified: Secondary | ICD-10-CM | POA: Diagnosis not present

## 2022-02-20 DIAGNOSIS — Z79899 Other long term (current) drug therapy: Secondary | ICD-10-CM | POA: Diagnosis not present

## 2022-02-20 DIAGNOSIS — Z794 Long term (current) use of insulin: Secondary | ICD-10-CM | POA: Diagnosis not present

## 2022-02-20 DIAGNOSIS — D72829 Elevated white blood cell count, unspecified: Secondary | ICD-10-CM | POA: Insufficient documentation

## 2022-02-20 DIAGNOSIS — E1122 Type 2 diabetes mellitus with diabetic chronic kidney disease: Secondary | ICD-10-CM | POA: Diagnosis not present

## 2022-02-20 DIAGNOSIS — R7989 Other specified abnormal findings of blood chemistry: Secondary | ICD-10-CM | POA: Insufficient documentation

## 2022-02-20 DIAGNOSIS — R112 Nausea with vomiting, unspecified: Secondary | ICD-10-CM | POA: Diagnosis present

## 2022-02-20 DIAGNOSIS — E1165 Type 2 diabetes mellitus with hyperglycemia: Secondary | ICD-10-CM | POA: Diagnosis not present

## 2022-02-20 IMAGING — DX DG ABDOMEN ACUTE W/ 1V CHEST
2 series · 4 of 4 positions shown · non-contrast
Comparison: [DATE].

CLINICAL DATA: Nausea and vomiting.

EXAM:
DG ABDOMEN ACUTE WITH 1 VIEW CHEST

[Series 1: abdomen · 0.14mm/px · 3 of 3 slices shown]
[im 1/3]
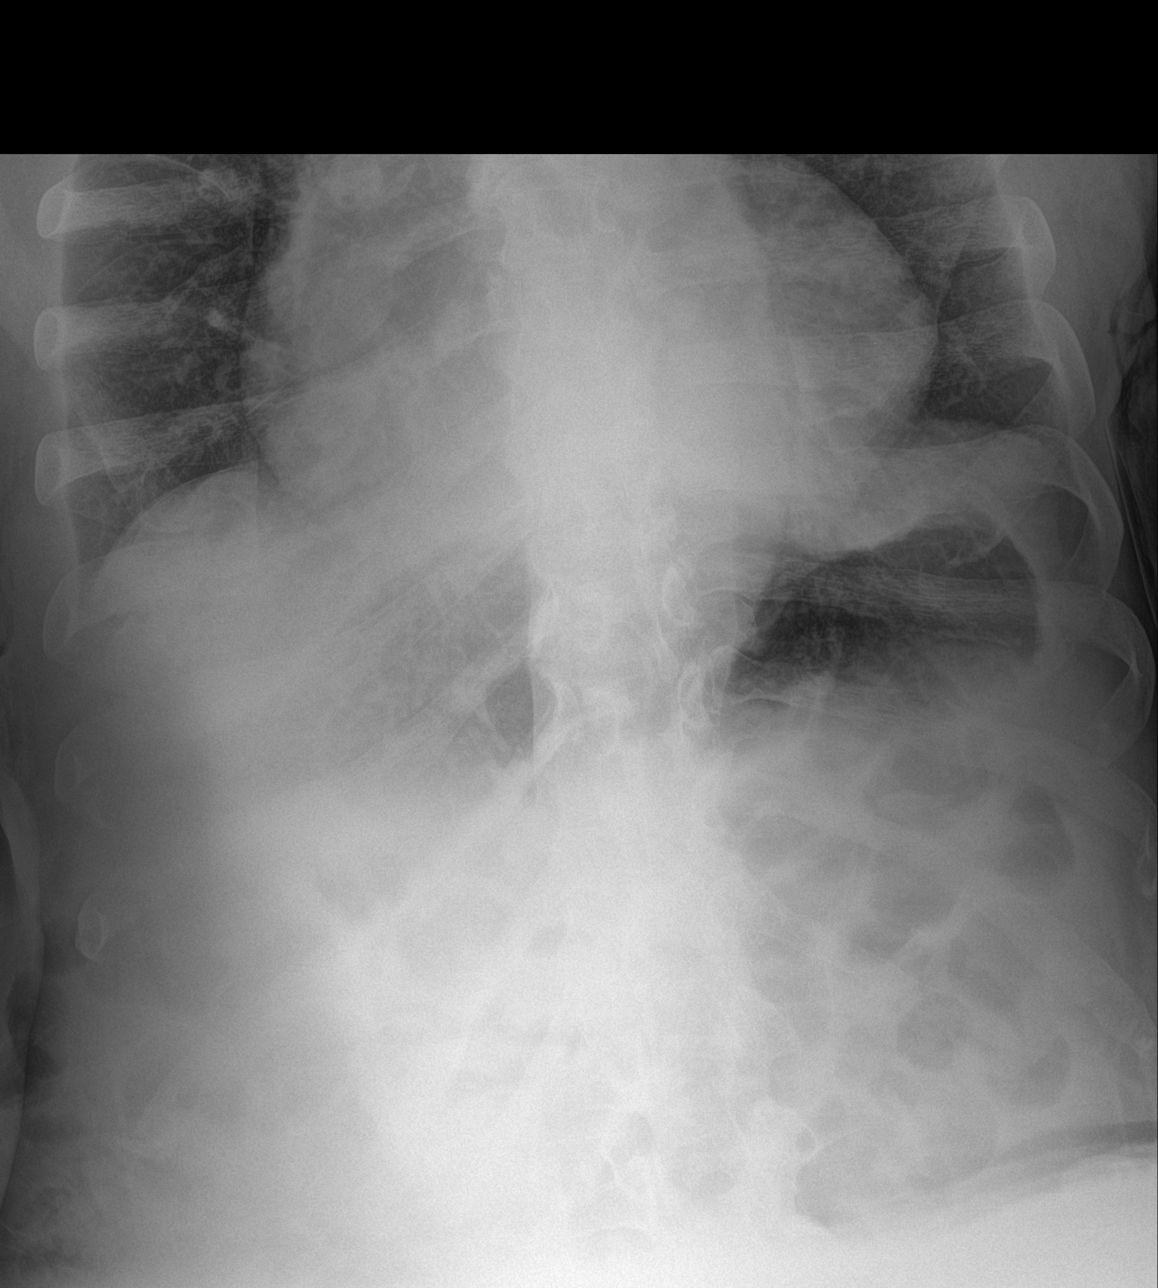
[im 2/3]
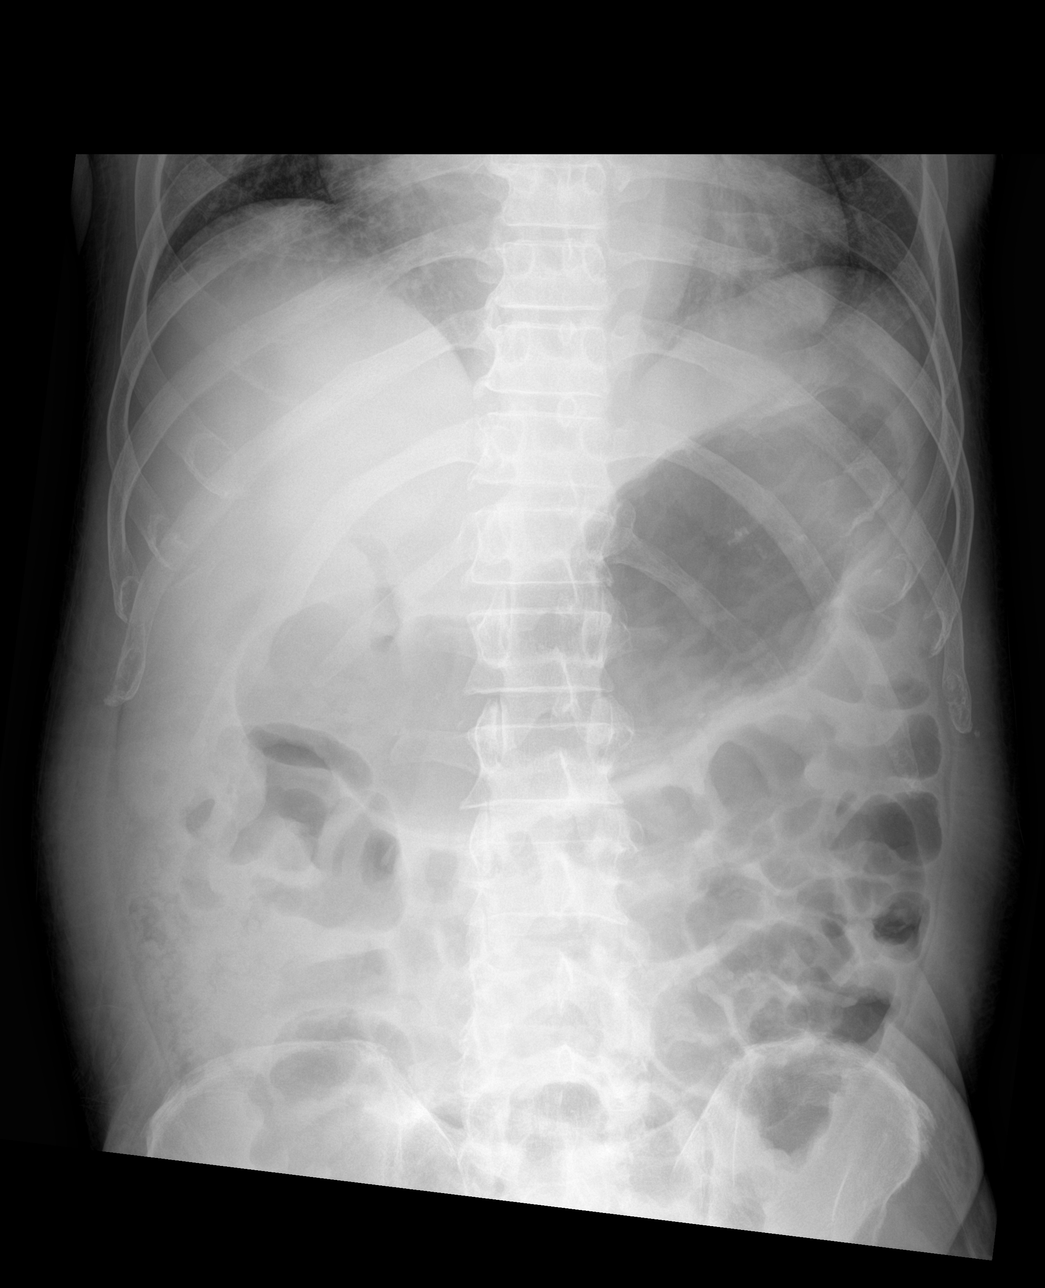
[im 3/3]
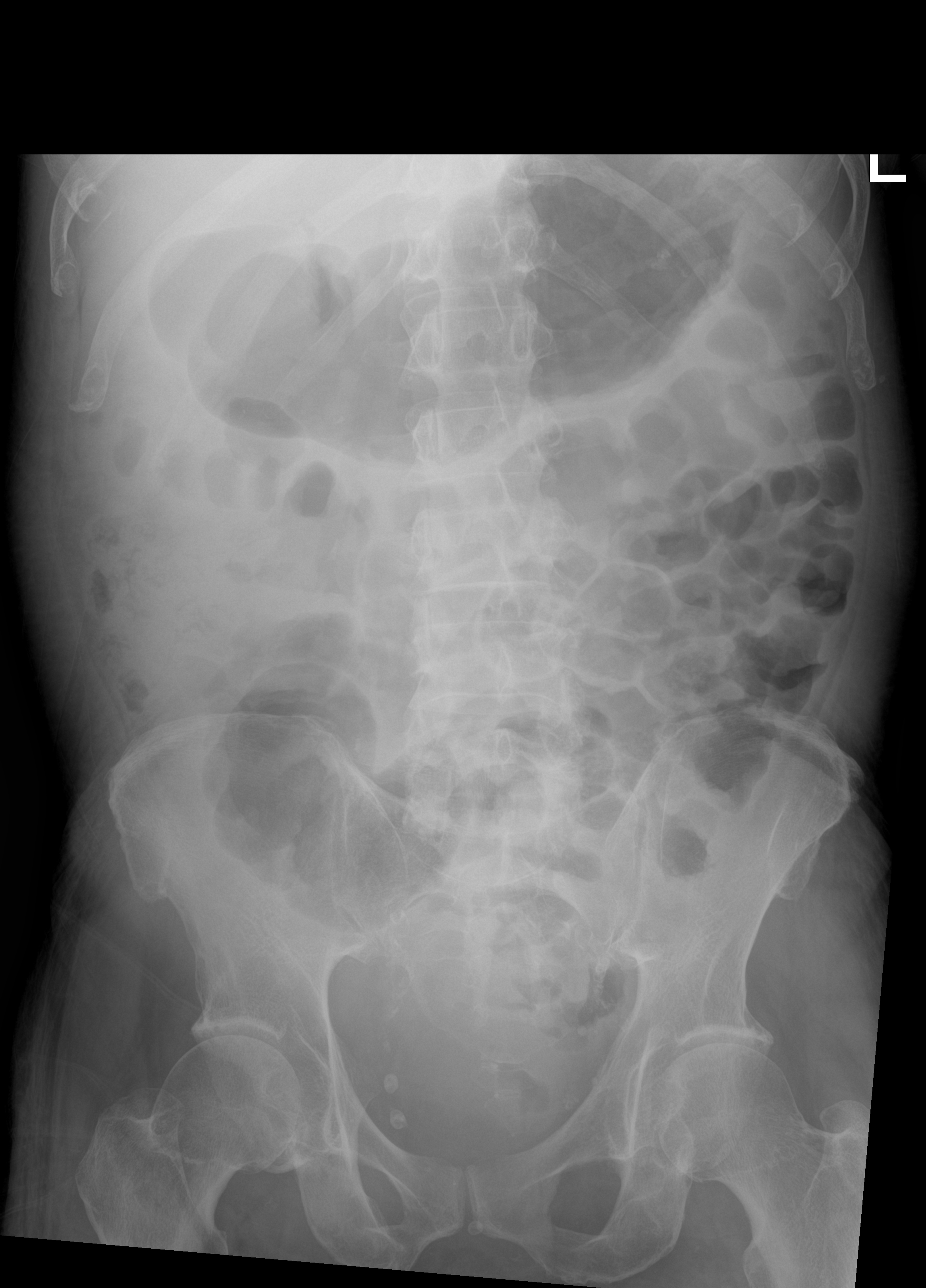

[chest]
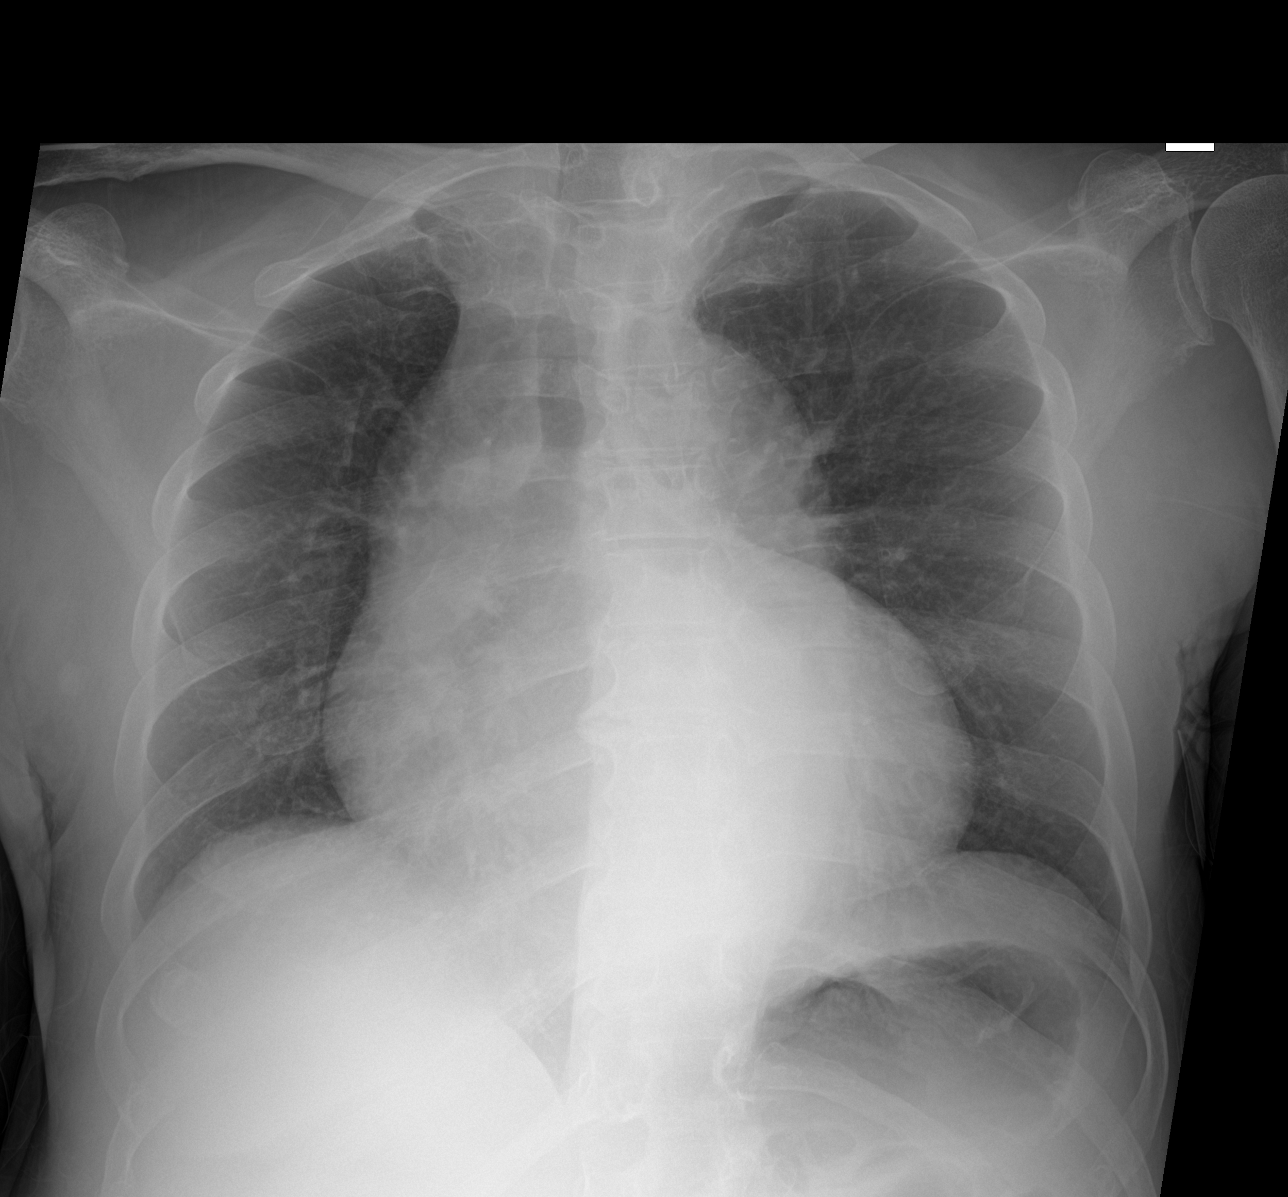

[4 of 4 positions shown; findings below may reference images not displayed]

FINDINGS: The small bowel is normal in caliber. The stomach is mildly
distended. No free air is seen. No radiopaque calculi. Vascular
calcifications are present in the pelvis. The heart is enlarged and
the mediastinal contour is stable. Both lungs are clear.
IMPRESSION: 1. No evidence of small bowel obstruction or free air.
2. Mildly distended stomach.
3. Cardiomegaly.

## 2022-02-20 MED ORDER — ONDANSETRON HCL 4 MG/2ML IJ SOLN
4.0000 mg | Freq: Once | INTRAMUSCULAR | Status: AC
  Administered 2022-02-20: 4 mg via INTRAVENOUS
  Filled 2022-02-20: qty 2

## 2022-02-20 NOTE — ED Triage Notes (Signed)
Patient BIB [facility] from [facility] c/o n/v.  Patient reports 4 episodes of emesis.  Patient denies pain.  ? ?148/72 ?68 ?100% ?CBG 438 ?

## 2022-02-20 NOTE — ED Provider Notes (Signed)
?[facility] EMERGENCY DEPARTMENT ?Provider Note ? ? ?CSN: [id number] ?Arrival date & time: 02/20/22  2301 ? ?  ? ?History ? ?Chief Complaint  ?Patient presents with  ? Emesis  ? ? ?Manuel Kramer is a 66 y.o. male. ? ?HPI ? ?  ? ?This is a 66 year old male with a history of chronic kidney disease, encephalopathy, C. difficile colitis, diabetes who presents with nausea and vomiting from his living facility.  Patient states he has no complaints at this time.  However earlier this evening he states that he had approximately 1 hour of nausea and vomiting.  No change in bowels.  Did not have any chest pain or abdominal pain at that time.  He states "I do not know what happened."  Denies fevers. ? ?Chart reviewed.  Recent admission for metabolic encephalopathy and C. difficile colitis.  He was discharged on 02/02/2022 to SNF. ? ?Home Medications ?Prior to Admission medications   ?Medication Sig Start Date End Date Taking? Authorizing Provider  ?acetaminophen (TYLENOL) 500 MG tablet Take 1 tablet (500 mg total) by mouth every 6 (six) hours as needed. ?Patient not taking: Reported on 01/26/2022 08/22/16   Elpidio Anis, PA-C  ?amLODipine (NORVASC) 10 MG tablet Take 1 tablet (10 mg total) by mouth daily. ?Patient not taking: Reported on 01/26/2022 01/05/17   Hoy Register, MD  ?aspirin EC 81 MG tablet Take 81 mg by mouth daily. Swallow whole.    [provider]  ?atorvastatin (LIPITOR) 40 MG tablet Take 1 tablet (40 mg total) by mouth daily. ?Patient not taking: Reported on 01/26/2022 12/06/21   Briant Cedar, MD  ?carvedilol (COREG) 25 MG tablet Take 1 tablet (25 mg total) by mouth 2 (two) times daily with a meal. ?Patient not taking: Reported on 01/26/2022 12/05/21   Briant Cedar, MD  ?cholecalciferol (VITAMIN D3) 25 MCG (1000 UNIT) tablet Take 1,000 Units by mouth daily.    [provider]  ?Cinnamon 500 MG capsule Take 500 mg by mouth daily.    [provider]   ?cloNIDine (CATAPRES) 0.3 MG tablet Take 1 tablet (0.3 mg total) by mouth 3 (three) times daily. 02/02/22   Rai, Delene Ruffini, MD  ?clopidogrel (PLAVIX) 75 MG tablet Take 75 mg by mouth daily.    [provider]  ?gabapentin (NEURONTIN) 100 MG capsule Take 100 mg by mouth at bedtime.    [provider]  ?Garlic 500 MG CAPS Take 1 capsule by mouth daily.    [provider]  ?hydrALAZINE (APRESOLINE) 100 MG tablet Take 1 tablet (100 mg total) by mouth every 8 (eight) hours. ?Patient not taking: Reported on 01/26/2022 12/05/21   Briant Cedar, MD  ?insulin glargine (LANTUS) 100 UNIT/ML Solostar Pen Inject 8 Units into the skin at bedtime. 02/02/22   Rai, Ripudeep K, MD  ?ipratropium (ATROVENT) 0.06 % nasal spray Place 2 sprays into both nostrils 2 (two) times daily.    [provider]  ?isosorbide mononitrate (IMDUR) 30 MG 24 hr tablet Take 1 tablet (30 mg total) by mouth daily. 02/03/22   Rai, Delene Ruffini, MD  ?loratadine (CLARITIN) 10 MG tablet Take 10 mg by mouth daily.    [provider]  ?Multiple Vitamin (MULTIVITAMIN WITH MINERALS) TABS tablet Take 1 tablet by mouth daily. ?Patient not taking: Reported on 01/26/2022 12/06/21   Briant Cedar, MD  ?niacin 100 MG tablet Take 100 mg by mouth 2 (two) times daily.    [provider]  ?  Nystatin (GERHARDT'S BUTT CREAM) CREA Apply 1 application. topically 2 (two) times daily. 02/02/22   Rai, Delene Ruffini, MD  ?sertraline (ZOLOFT) 25 MG tablet Take 1 tablet (25 mg total) by mouth daily. 02/02/22   Rai, Delene Ruffini, MD  ?sodium bicarbonate 650 MG tablet Take 1,300 mg by mouth 2 (two) times daily.    [provider]  ?   ? ?Allergies    ?Codeine, Iodinated contrast media, Pentazocine, and Statins   ? ?Review of Systems   ?Review of Systems  ?Constitutional:  Negative for fever.  ?Respiratory:  Negative for shortness of breath.   ?Cardiovascular:  Negative for chest pain.  ?Gastrointestinal:  Positive for nausea and  vomiting. Negative for abdominal pain, constipation and diarrhea.  ?All other systems reviewed and are negative. ? ?Physical Exam ?Updated Vital Signs ?BP (!) 159/85   Pulse 63   Temp 97.6 ?F (36.4 ?C) (Oral)   Resp 13   Ht 1.575 m (5\' 2" )   Wt 61.2 kg   SpO2 100%   BMI 24.69 kg/m?  ?Physical Exam ?Vitals and nursing note reviewed.  ?Constitutional:   ?   Appearance: He is well-developed.  ?   Comments: Chronically ill-appearing  ?HENT:  ?   Head: Normocephalic and atraumatic.  ?   Mouth/Throat:  ?   Mouth: Mucous membranes are dry.  ?Eyes:  ?   Pupils: Pupils are equal, round, and reactive to light.  ?Cardiovascular:  ?   Rate and Rhythm: Normal rate and regular rhythm.  ?   Heart sounds: Normal heart sounds. No murmur heard. ?Pulmonary:  ?   Effort: Pulmonary effort is normal. No respiratory distress.  ?   Breath sounds: Normal breath sounds. No wheezing.  ?Abdominal:  ?   General: Bowel sounds are normal.  ?   Palpations: Abdomen is soft.  ?   Tenderness: There is no abdominal tenderness. There is no rebound.  ?Musculoskeletal:  ?   Cervical back: Neck supple.  ?Lymphadenopathy:  ?   Cervical: No cervical adenopathy.  ?Skin: ?   General: Skin is warm and dry.  ?Neurological:  ?   Mental Status: He is alert and oriented to person, place, and time.  ?Psychiatric:     ?   Mood and Affect: Mood normal.  ? ? ?ED Results / Procedures / Treatments   ?Labs ?(all labs ordered are listed, but only abnormal results are displayed) ?Labs Reviewed  ?LIPASE, BLOOD - Abnormal; Notable for the following components:  ?    Result Value  ? Lipase 57 (*)   ? All other components within normal limits  ?COMPREHENSIVE METABOLIC PANEL - Abnormal; Notable for the following components:  ? Sodium 133 (*)   ? CO2 20 (*)   ? Glucose, Bld 400 (*)   ? BUN 25 (*)   ? Creatinine, Ser 2.52 (*)   ? Calcium 8.4 (*)   ? Total Protein 5.3 (*)   ? Albumin 2.2 (*)   ? GFR, Estimated 28 (*)   ? All other components within normal limits  ?CBC -  Abnormal; Notable for the following components:  ? WBC 15.1 (*)   ? RBC 3.65 (*)   ? Hemoglobin 11.0 (*)   ? HCT 33.0 (*)   ? Platelets 476 (*)   ? All other components within normal limits  ?URINALYSIS, ROUTINE W REFLEX MICROSCOPIC  ?CBG MONITORING, ED  ? ? ?EKG ?EKG Interpretation ? ?Date/Time:  Tuesday February 21 2022 00:10:34 EDT ?Ventricular  Rate:  61 ?PR Interval:  166 ?QRS Duration: 106 ?QT Interval:  457 ?QTC Calculation: 461 ?R Axis:   19 ?Text Interpretation: Sinus rhythm Probable left atrial enlargement Left ventricular hypertrophy Confirmed by Ross MarcusHorton, Lucine Bilski ([id number]) on 02/21/2022 12:36:42 AM ? ?Radiology ?CT ABDOMEN PELVIS WO CONTRAST ? ?Result Date: 02/21/2022 ?CLINICAL DATA:  Nausea and vomiting. EXAM: CT ABDOMEN AND PELVIS WITHOUT CONTRAST TECHNIQUE: Multidetector CT imaging of the abdomen and pelvis was performed following the standard protocol without IV contrast. RADIATION DOSE REDUCTION: This exam was performed according to the departmental dose-optimization program which includes automated exposure control, adjustment of the mA and/or kV according to patient size and/or use of iterative reconstruction technique. COMPARISON:  01/26/2022. FINDINGS: Lower chest: The heart is enlarged and there is a trace pericardial effusion. There small bilateral pleural effusions with atelectasis at the lung bases. Hepatobiliary: No focal liver abnormality is seen. The gallbladder is contracted. No biliary ductal dilatation. Pancreas: Unremarkable. No pancreatic ductal dilatation or surrounding inflammatory changes. Spleen: The spleen is normal size. Coarse calcifications are present within the spleen. Adrenals/Urinary Tract: No adrenal nodule or mass. No renal calculus or hydronephrosis. A subcentimeter hypodensity is present in the right kidney which is too small to further characterize. The bladder is within normal limits. Stomach/Bowel: Stomach is within normal limits. No bowel obstruction, free air, or  pneumatosis. The appendix is not visualized on exam. There is diffuse thickening of the walls of the rectosigmoid colon. Vascular/Lymphatic: Atherosclerotic calcification of the aorta without evidence of aneurysm. Enlarged lymph n

## 2022-02-21 ENCOUNTER — Emergency Department (HOSPITAL_COMMUNITY): Payer: Medicare Other

## 2022-02-21 DIAGNOSIS — A0472 Enterocolitis due to Clostridium difficile, not specified as recurrent: Secondary | ICD-10-CM | POA: Diagnosis not present

## 2022-02-21 LAB — COMPREHENSIVE METABOLIC PANEL
ALT: 16 U/L (ref 0–44)
AST: 25 U/L (ref 15–41)
Albumin: 2.2 g/dL — ABNORMAL LOW (ref 3.5–5.0)
Alkaline Phosphatase: 119 U/L (ref 38–126)
Anion gap: 7 (ref 5–15)
BUN: 25 mg/dL — ABNORMAL HIGH (ref 8–23)
CO2: 20 mmol/L — ABNORMAL LOW (ref 22–32)
Calcium: 8.4 mg/dL — ABNORMAL LOW (ref 8.9–10.3)
Chloride: 106 mmol/L (ref 98–111)
Creatinine, Ser: 2.52 mg/dL — ABNORMAL HIGH (ref 0.61–1.24)
GFR, Estimated: 28 mL/min — ABNORMAL LOW (ref >60)
Glucose, Bld: 400 mg/dL — ABNORMAL HIGH (ref 70–99)
Potassium: 3.7 mmol/L (ref 3.5–5.1)
Sodium: 133 mmol/L — ABNORMAL LOW (ref 135–145)
Total Bilirubin: 0.5 mg/dL (ref 0.3–1.2)
Total Protein: 5.3 g/dL — ABNORMAL LOW (ref 6.5–8.1)

## 2022-02-21 LAB — CBC
HCT: 33 % — ABNORMAL LOW (ref 39.0–52.0)
Hemoglobin: 11 g/dL — ABNORMAL LOW (ref 13.0–17.0)
MCH: 30.1 pg (ref 26.0–34.0)
MCHC: 33.3 g/dL (ref 30.0–36.0)
MCV: 90.4 fL (ref 80.0–100.0)
Platelets: 476 10*3/uL — ABNORMAL HIGH (ref 150–400)
RBC: 3.65 MIL/uL — ABNORMAL LOW (ref 4.22–5.81)
RDW: 14.2 % (ref 11.5–15.5)
WBC: 15.1 10*3/uL — ABNORMAL HIGH (ref 4.0–10.5)
nRBC: 0 % (ref 0.0–0.2)

## 2022-02-21 LAB — CBG MONITORING, ED: Glucose-Capillary: 349 mg/dL — ABNORMAL HIGH (ref 70–99)

## 2022-02-21 LAB — LIPASE, BLOOD: Lipase: 57 U/L — ABNORMAL HIGH (ref 11–51)

## 2022-02-21 IMAGING — CT CT ABD-PELV W/O CM
2 of 4 series · 15 of 46 positions shown, 17 images · non-contrast
Comparison: [DATE].

CLINICAL DATA: Nausea and vomiting.



[Series 3: a/p w/o 5mm · axial · non-contrast · 0.70mm/px · z∈[+861,+1266]mm · 12 of 97 slices shown, 14 images]
[im 8/97  soft-tissue]
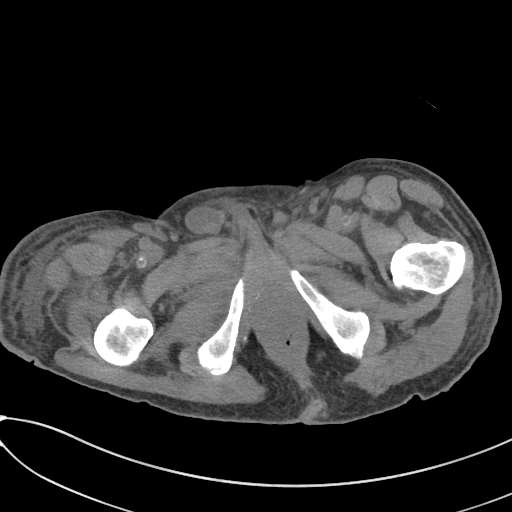
[im 8/97  bone]
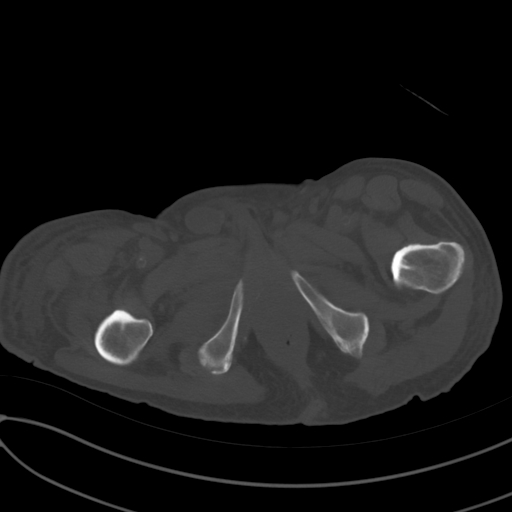
[im 15/97  soft-tissue]
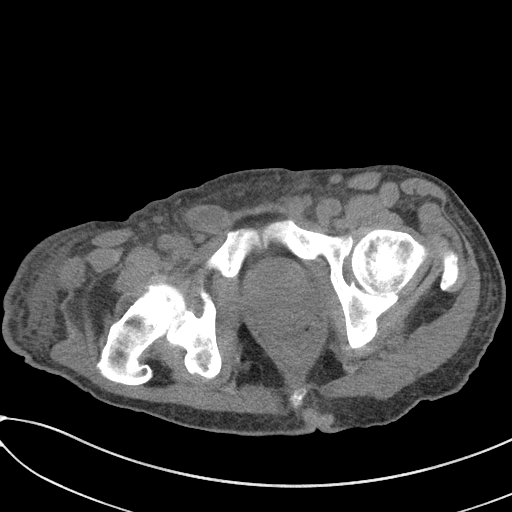
[im 23/97  soft-tissue]
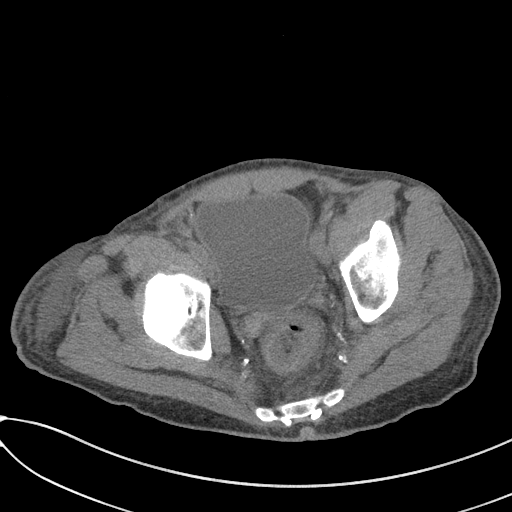
[im 30/97  soft-tissue]
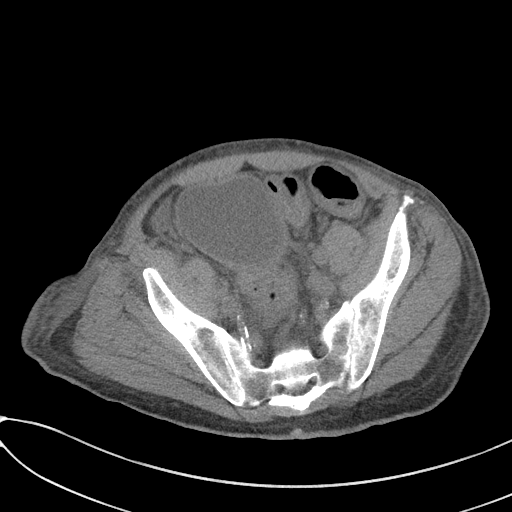
[im 37/97  soft-tissue]
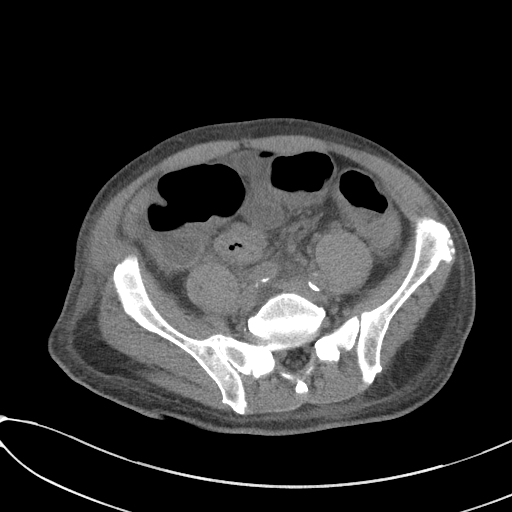
[im 45/97  soft-tissue]
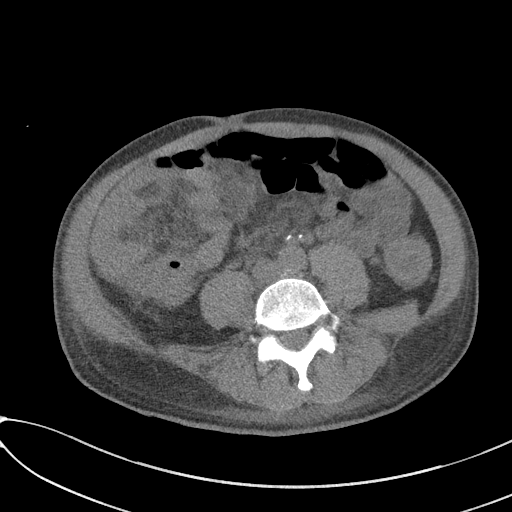
[im 52/97  soft-tissue]
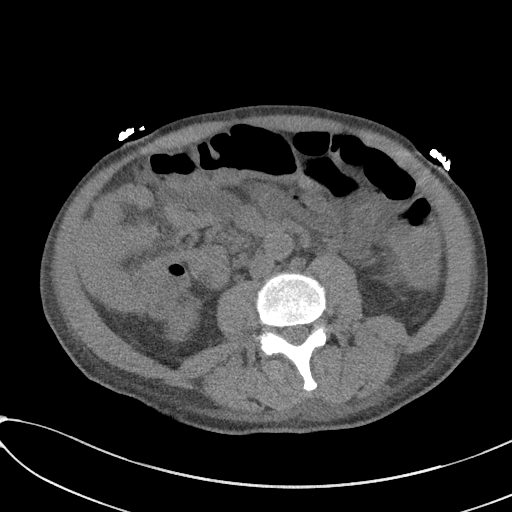
[im 60/97  soft-tissue]
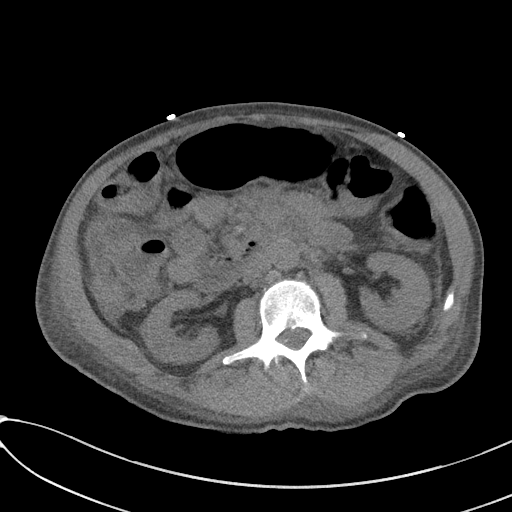
[im 67/97  soft-tissue]
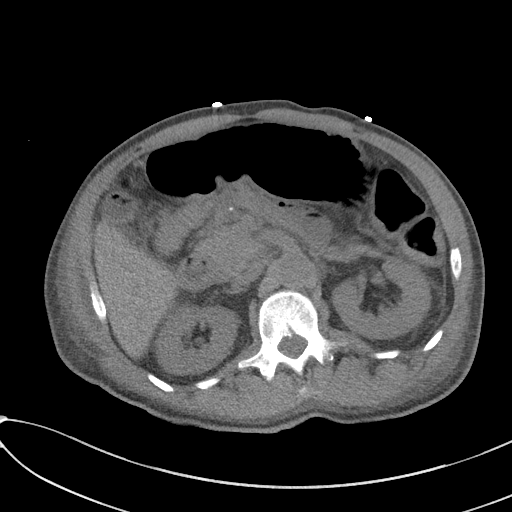
[im 67/97  bone]
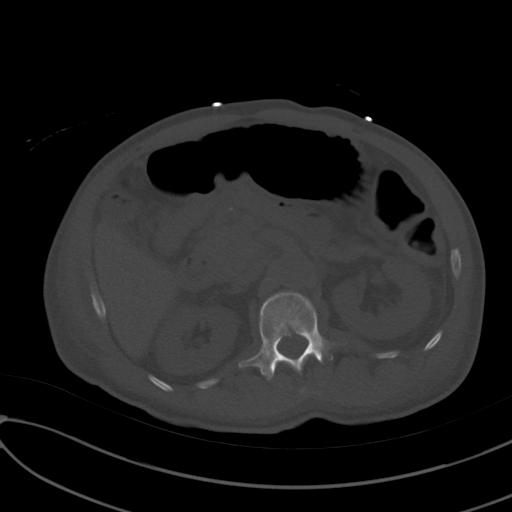
[im 74/97  soft-tissue]
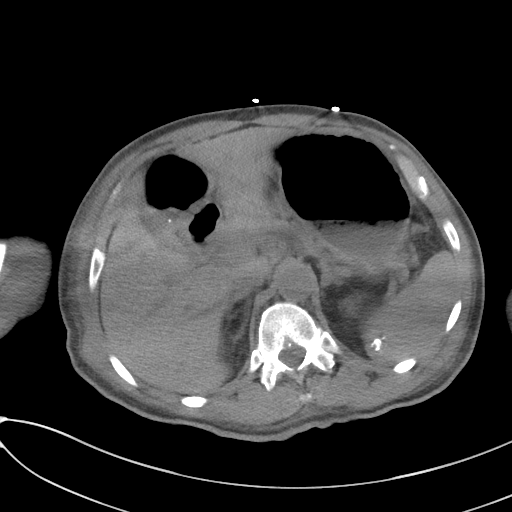
[im 82/97  soft-tissue]
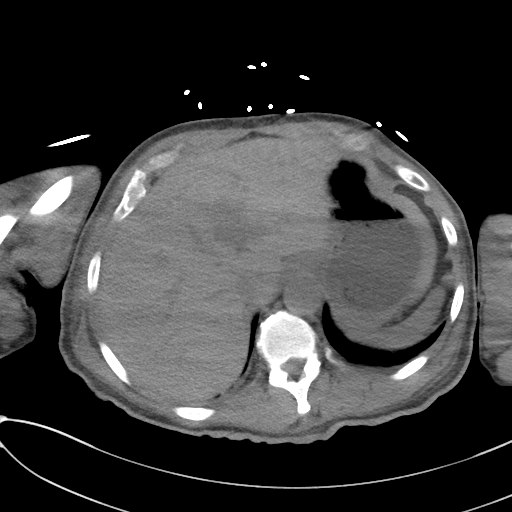
[im 89/97  soft-tissue]
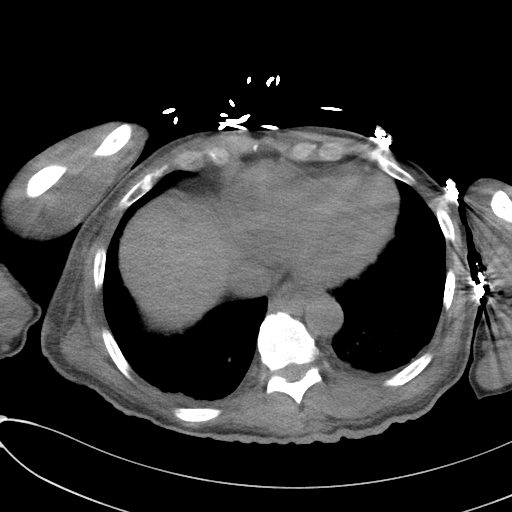

[Series 6: a/p w/o cor · coronal · non-contrast · 0.69mm/px · 3 of 125 slices shown]
[im 42/125  soft-tissue]
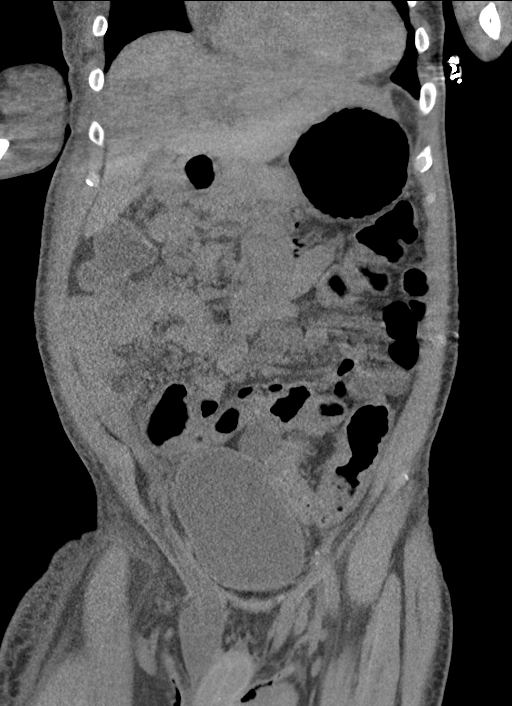
[im 56/125  soft-tissue]
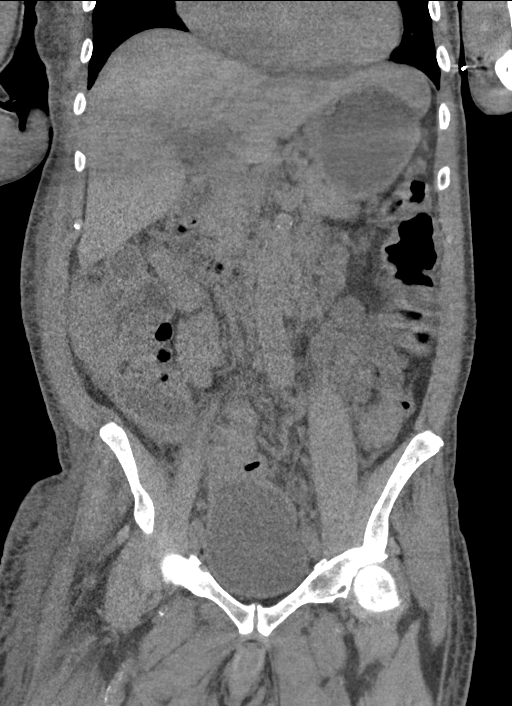
[im 69/125  soft-tissue]
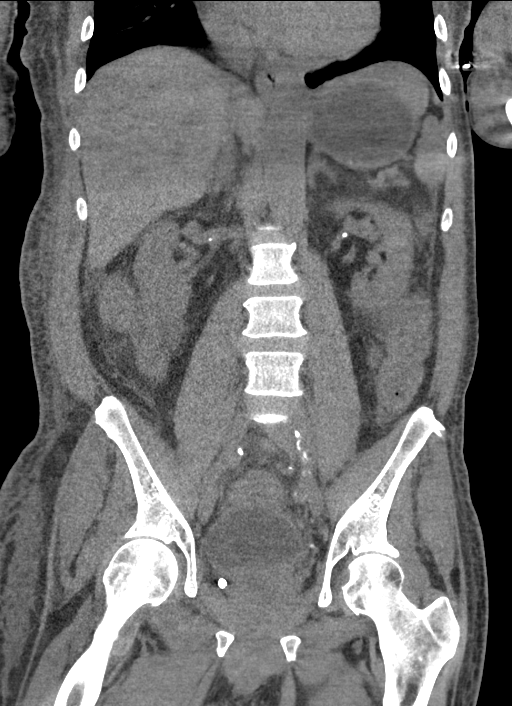

[15 of 46 positions shown; findings below may reference images not displayed]

FINDINGS: Lower chest: The heart is enlarged and there is a trace pericardial
effusion. There small bilateral pleural effusions with atelectasis
at the lung bases.

Hepatobiliary: No focal liver abnormality is seen. The gallbladder
is contracted. No biliary ductal dilatation.

Pancreas: Unremarkable. No pancreatic ductal dilatation or
surrounding inflammatory changes.

Spleen: The spleen is normal size. Coarse calcifications are present
within the spleen.

Adrenals/Urinary Tract: No adrenal nodule or mass. No renal calculus
or hydronephrosis. A subcentimeter hypodensity is present in the
right kidney which is too small to further characterize. The bladder
is within normal limits.

Stomach/Bowel: Stomach is within normal limits. No bowel
obstruction, free air, or pneumatosis. The appendix is not
visualized on exam. There is diffuse thickening of the walls of the
rectosigmoid colon.

Vascular/Lymphatic: Atherosclerotic calcification of the aorta
without evidence of aneurysm. Enlarged lymph nodes are present in
the retroperitoneum in the periaortic space on the left. Prominent
lymph nodes are noted in the inguinal regions bilaterally.

Reproductive: The prostate gland is enlarged.

Other: There is mesenteric edema. Fluid attenuation is noted in the
right inguinal canal, unchanged.

Musculoskeletal: Diffuse anasarca is noted. Degenerative changes are
present in the thoracolumbar spine.
IMPRESSION: 1. Diffuse colonic wall thickening suggesting colitis.
2. Small bilateral pleural effusions with atelectasis at the lung
bases.
3. Enlarged prostate gland.
4. Mesenteric edema and anasarca.
5. Cardiomegaly with a trace pericardial effusion.
6. Aortic atherosclerosis.
7. Fluid attenuation in the right inguinal canal, unchanged from the
previous exam.

## 2022-02-21 MED ORDER — SODIUM CHLORIDE 0.9 % IV BOLUS
1000.0000 mL | Freq: Once | INTRAVENOUS | Status: AC
Start: 2022-02-21 — End: 2022-02-21
  Administered 2022-02-21: 1000 mL via INTRAVENOUS

## 2022-02-21 NOTE — ED Notes (Signed)
Patient to CT.

## 2022-02-21 NOTE — Discharge Instructions (Signed)
You were seen today for nausea and vomiting.  Your presentation is still consistent with colitis.  Continue vancomycin.  Make sure that you are staying hydrated.  Check blood sugars and continue diabetes medications at your facility ?

## 2022-05-07 ENCOUNTER — Encounter (HOSPITAL_COMMUNITY): Payer: Self-pay | Admitting: Critical Care Medicine

## 2022-05-07 ENCOUNTER — Emergency Department (HOSPITAL_COMMUNITY): Payer: Medicare Other

## 2022-05-07 ENCOUNTER — Inpatient Hospital Stay (HOSPITAL_COMMUNITY)
Admission: EM | Admit: 2022-05-07 | Discharge: 2022-05-12 | DRG: 637 | Disposition: A | Discharge status: Skilled Nursing Facility | Payer: Medicare Other | Source: Skilled Nursing Facility | Attending: Internal Medicine | Admitting: Internal Medicine

## 2022-05-07 DIAGNOSIS — I5032 Chronic diastolic (congestive) heart failure: Secondary | ICD-10-CM | POA: Diagnosis present

## 2022-05-07 DIAGNOSIS — Z7902 Long term (current) use of antithrombotics/antiplatelets: Secondary | ICD-10-CM | POA: Diagnosis not present

## 2022-05-07 DIAGNOSIS — Z7982 Long term (current) use of aspirin: Secondary | ICD-10-CM | POA: Diagnosis not present

## 2022-05-07 DIAGNOSIS — E872 Acidosis, unspecified: Secondary | ICD-10-CM | POA: Diagnosis present

## 2022-05-07 DIAGNOSIS — J9601 Acute respiratory failure with hypoxia: Secondary | ICD-10-CM | POA: Diagnosis present

## 2022-05-07 DIAGNOSIS — I639 Cerebral infarction, unspecified: Secondary | ICD-10-CM

## 2022-05-07 DIAGNOSIS — E876 Hypokalemia: Secondary | ICD-10-CM | POA: Diagnosis present

## 2022-05-07 DIAGNOSIS — Z794 Long term (current) use of insulin: Secondary | ICD-10-CM | POA: Diagnosis not present

## 2022-05-07 DIAGNOSIS — I6329 Cerebral infarction due to unspecified occlusion or stenosis of other precerebral arteries: Secondary | ICD-10-CM | POA: Diagnosis present

## 2022-05-07 DIAGNOSIS — J690 Pneumonitis due to inhalation of food and vomit: Secondary | ICD-10-CM | POA: Diagnosis present

## 2022-05-07 DIAGNOSIS — L89152 Pressure ulcer of sacral region, stage 2: Secondary | ICD-10-CM | POA: Diagnosis present

## 2022-05-07 DIAGNOSIS — D631 Anemia in chronic kidney disease: Secondary | ICD-10-CM | POA: Diagnosis present

## 2022-05-07 DIAGNOSIS — Z9911 Dependence on respirator [ventilator] status: Secondary | ICD-10-CM | POA: Diagnosis not present

## 2022-05-07 DIAGNOSIS — E785 Hyperlipidemia, unspecified: Secondary | ICD-10-CM | POA: Diagnosis present

## 2022-05-07 DIAGNOSIS — G40909 Epilepsy, unspecified, not intractable, without status epilepticus: Secondary | ICD-10-CM | POA: Diagnosis present

## 2022-05-07 DIAGNOSIS — E11649 Type 2 diabetes mellitus with hypoglycemia without coma: Secondary | ICD-10-CM | POA: Diagnosis not present

## 2022-05-07 DIAGNOSIS — R4182 Altered mental status, unspecified: Principal | ICD-10-CM

## 2022-05-07 DIAGNOSIS — Z20822 Contact with and (suspected) exposure to covid-19: Secondary | ICD-10-CM | POA: Diagnosis present

## 2022-05-07 DIAGNOSIS — F17210 Nicotine dependence, cigarettes, uncomplicated: Secondary | ICD-10-CM | POA: Diagnosis present

## 2022-05-07 DIAGNOSIS — G9340 Encephalopathy, unspecified: Secondary | ICD-10-CM | POA: Diagnosis present

## 2022-05-07 DIAGNOSIS — D638 Anemia in other chronic diseases classified elsewhere: Secondary | ICD-10-CM | POA: Diagnosis present

## 2022-05-07 DIAGNOSIS — I161 Hypertensive emergency: Secondary | ICD-10-CM | POA: Diagnosis present

## 2022-05-07 DIAGNOSIS — Z809 Family history of malignant neoplasm, unspecified: Secondary | ICD-10-CM

## 2022-05-07 DIAGNOSIS — N179 Acute kidney failure, unspecified: Secondary | ICD-10-CM | POA: Diagnosis present

## 2022-05-07 DIAGNOSIS — N1832 Chronic kidney disease, stage 3b: Secondary | ICD-10-CM | POA: Diagnosis present

## 2022-05-07 DIAGNOSIS — E1101 Type 2 diabetes mellitus with hyperosmolarity with coma: Secondary | ICD-10-CM | POA: Diagnosis present

## 2022-05-07 DIAGNOSIS — D509 Iron deficiency anemia, unspecified: Secondary | ICD-10-CM | POA: Diagnosis present

## 2022-05-07 DIAGNOSIS — I130 Hypertensive heart and chronic kidney disease with heart failure and stage 1 through stage 4 chronic kidney disease, or unspecified chronic kidney disease: Secondary | ICD-10-CM | POA: Diagnosis present

## 2022-05-07 DIAGNOSIS — I6389 Other cerebral infarction: Secondary | ICD-10-CM | POA: Diagnosis not present

## 2022-05-07 DIAGNOSIS — Z79899 Other long term (current) drug therapy: Secondary | ICD-10-CM | POA: Diagnosis not present

## 2022-05-07 DIAGNOSIS — R739 Hyperglycemia, unspecified: Secondary | ICD-10-CM

## 2022-05-07 DIAGNOSIS — E1122 Type 2 diabetes mellitus with diabetic chronic kidney disease: Secondary | ICD-10-CM | POA: Diagnosis present

## 2022-05-07 DIAGNOSIS — E43 Unspecified severe protein-calorie malnutrition: Secondary | ICD-10-CM | POA: Diagnosis present

## 2022-05-07 DIAGNOSIS — A0472 Enterocolitis due to Clostridium difficile, not specified as recurrent: Secondary | ICD-10-CM | POA: Diagnosis present

## 2022-05-07 DIAGNOSIS — L8990 Pressure ulcer of unspecified site, unspecified stage: Secondary | ICD-10-CM | POA: Insufficient documentation

## 2022-05-07 DIAGNOSIS — G9341 Metabolic encephalopathy: Secondary | ICD-10-CM | POA: Diagnosis present

## 2022-05-07 DIAGNOSIS — L89892 Pressure ulcer of other site, stage 2: Secondary | ICD-10-CM | POA: Diagnosis present

## 2022-05-07 DIAGNOSIS — Z8249 Family history of ischemic heart disease and other diseases of the circulatory system: Secondary | ICD-10-CM

## 2022-05-07 DIAGNOSIS — Z833 Family history of diabetes mellitus: Secondary | ICD-10-CM

## 2022-05-07 DIAGNOSIS — R569 Unspecified convulsions: Secondary | ICD-10-CM | POA: Diagnosis not present

## 2022-05-07 LAB — COMPREHENSIVE METABOLIC PANEL
ALT: 33 U/L (ref 0–44)
AST: 21 U/L (ref 15–41)
Albumin: 2.8 g/dL — ABNORMAL LOW (ref 3.5–5.0)
Alkaline Phosphatase: 115 U/L (ref 38–126)
Anion gap: 15 (ref 5–15)
BUN: 29 mg/dL — ABNORMAL HIGH (ref 8–23)
CO2: 22 mmol/L (ref 22–32)
Calcium: 8.8 mg/dL — ABNORMAL LOW (ref 8.9–10.3)
Chloride: 98 mmol/L (ref 98–111)
Creatinine, Ser: 2.17 mg/dL — ABNORMAL HIGH (ref 0.61–1.24)
GFR, Estimated: 33 mL/min — ABNORMAL LOW (ref >60)
Glucose, Bld: 834 mg/dL (ref 70–99)
Potassium: 3.4 mmol/L — ABNORMAL LOW (ref 3.5–5.1)
Sodium: 135 mmol/L (ref 135–145)
Total Bilirubin: 0.7 mg/dL (ref 0.3–1.2)
Total Protein: 6.3 g/dL — ABNORMAL LOW (ref 6.5–8.1)

## 2022-05-07 LAB — DIFFERENTIAL
Abs Immature Granulocytes: 0.04 10*3/uL (ref 0.00–0.07)
Basophils Absolute: 0 10*3/uL (ref 0.0–0.1)
Basophils Relative: 0 %
Eosinophils Absolute: 0.1 10*3/uL (ref 0.0–0.5)
Eosinophils Relative: 1 %
Immature Granulocytes: 0 %
Lymphocytes Relative: 12 %
Lymphs Abs: 1.1 10*3/uL (ref 0.7–4.0)
Monocytes Absolute: 0.4 10*3/uL (ref 0.1–1.0)
Monocytes Relative: 4 %
Neutro Abs: 7.5 10*3/uL (ref 1.7–7.7)
Neutrophils Relative %: 83 %

## 2022-05-07 LAB — RESP PANEL BY RT-PCR (FLU A&B, COVID) ARPGX2
Influenza A by PCR: NEGATIVE
Influenza B by PCR: NEGATIVE
SARS Coronavirus 2 by RT PCR: NEGATIVE

## 2022-05-07 LAB — CBC
HCT: 36.9 % — ABNORMAL LOW (ref 39.0–52.0)
Hemoglobin: 11.9 g/dL — ABNORMAL LOW (ref 13.0–17.0)
MCH: 29.2 pg (ref 26.0–34.0)
MCHC: 32.2 g/dL (ref 30.0–36.0)
MCV: 90.7 fL (ref 80.0–100.0)
Platelets: 296 10*3/uL (ref 150–400)
RBC: 4.07 MIL/uL — ABNORMAL LOW (ref 4.22–5.81)
RDW: 14.1 % (ref 11.5–15.5)
WBC: 9.1 10*3/uL (ref 4.0–10.5)
nRBC: 0 % (ref 0.0–0.2)

## 2022-05-07 LAB — ETHANOL: Alcohol, Ethyl (B): <10 mg/dL (ref <10)

## 2022-05-07 LAB — I-STAT ARTERIAL BLOOD GAS, ED
Acid-Base Excess: 5 mmol/L — ABNORMAL HIGH (ref 0.0–2.0)
Bicarbonate: 28.7 mmol/L — ABNORMAL HIGH (ref 20.0–28.0)
Calcium, Ion: 1.2 mmol/L (ref 1.15–1.40)
HCT: 35 % — ABNORMAL LOW (ref 39.0–52.0)
Hemoglobin: 11.9 g/dL — ABNORMAL LOW (ref 13.0–17.0)
O2 Saturation: 100 %
Patient temperature: 98.1
Potassium: 2.8 mmol/L — ABNORMAL LOW (ref 3.5–5.1)
Sodium: 136 mmol/L (ref 135–145)
TCO2: 30 mmol/L (ref 22–32)
pCO2 arterial: 39 mmHg (ref 32–48)
pH, Arterial: 7.474 — ABNORMAL HIGH (ref 7.35–7.45)
pO2, Arterial: 433 mmHg — ABNORMAL HIGH (ref 83–108)

## 2022-05-07 LAB — I-STAT CHEM 8, ED
BUN: 30 mg/dL — ABNORMAL HIGH (ref 8–23)
Calcium, Ion: 1.13 mmol/L — ABNORMAL LOW (ref 1.15–1.40)
Chloride: 98 mmol/L (ref 98–111)
Creatinine, Ser: 2.1 mg/dL — ABNORMAL HIGH (ref 0.61–1.24)
Glucose, Bld: >700 mg/dL (ref 70–99)
HCT: 38 % — ABNORMAL LOW (ref 39.0–52.0)
Hemoglobin: 12.9 g/dL — ABNORMAL LOW (ref 13.0–17.0)
Potassium: 3.5 mmol/L (ref 3.5–5.1)
Sodium: 135 mmol/L (ref 135–145)
TCO2: 23 mmol/L (ref 22–32)

## 2022-05-07 LAB — PROTIME-INR
INR: 0.9 (ref 0.8–1.2)
Prothrombin Time: 12.5 seconds (ref 11.4–15.2)

## 2022-05-07 LAB — GLUCOSE, CAPILLARY: Glucose-Capillary: 536 mg/dL (ref 70–99)

## 2022-05-07 LAB — CBG MONITORING, ED
Glucose-Capillary: >600 mg/dL (ref 70–99)
Glucose-Capillary: >600 mg/dL (ref 70–99)

## 2022-05-07 LAB — APTT: aPTT: 26 seconds (ref 24–36)

## 2022-05-07 MED ORDER — DOCUSATE SODIUM 100 MG PO CAPS: 100.0000 mg | ORAL_CAPSULE | Freq: Two times a day (BID) | ORAL | Status: DC | PRN

## 2022-05-07 MED ORDER — DEXTROSE 50 % IV SOLN: 0.0000 mL | INTRAVENOUS | Status: DC | PRN

## 2022-05-07 MED ORDER — AMLODIPINE BESYLATE 10 MG PO TABS
10.0000 mg | ORAL_TABLET | Freq: Every day | ORAL | Status: DC
  Administered 2022-05-08 – 2022-05-09 (×2): 10 mg
  Filled 2022-05-07 (×2): qty 1

## 2022-05-07 MED ORDER — ATORVASTATIN CALCIUM 40 MG PO TABS
40.0000 mg | ORAL_TABLET | Freq: Every day | ORAL | Status: DC
  Administered 2022-05-08 – 2022-05-09 (×2): 40 mg
  Filled 2022-05-07 (×2): qty 1

## 2022-05-07 MED ORDER — FAMOTIDINE 20 MG PO TABS
20.0000 mg | ORAL_TABLET | Freq: Two times a day (BID) | ORAL | Status: DC
  Administered 2022-05-07: 20 mg
  Filled 2022-05-07: qty 1

## 2022-05-07 MED ORDER — VANCOMYCIN HCL 1250 MG/250ML IV SOLN
1250.0000 mg | Freq: Once | INTRAVENOUS | Status: AC
  Administered 2022-05-07: 1250 mg via INTRAVENOUS
  Filled 2022-05-07 (×2): qty 250

## 2022-05-07 MED ORDER — FENTANYL 2500MCG IN NS 250ML (10MCG/ML) PREMIX INFUSION
0.0000 ug/h | INTRAVENOUS | Status: DC
  Administered 2022-05-07: 25 ug/h via INTRAVENOUS
  Filled 2022-05-07: qty 250

## 2022-05-07 MED ORDER — PANTOPRAZOLE 2 MG/ML SUSPENSION
40.0000 mg | Freq: Every day | ORAL | Status: DC
  Administered 2022-05-07: 40 mg
  Filled 2022-05-07 (×3): qty 20

## 2022-05-07 MED ORDER — SERTRALINE HCL 50 MG PO TABS
50.0000 mg | ORAL_TABLET | Freq: Every day | ORAL | Status: DC
Start: 2022-05-08 — End: 2022-05-09
  Administered 2022-05-08 – 2022-05-09 (×2): 50 mg
  Filled 2022-05-07 (×2): qty 1

## 2022-05-07 MED ORDER — ISOSORBIDE DINITRATE 20 MG PO TABS
10.0000 mg | ORAL_TABLET | Freq: Three times a day (TID) | ORAL | Status: DC
  Administered 2022-05-07 – 2022-05-09 (×5): 10 mg
  Filled 2022-05-07 (×5): qty 1

## 2022-05-07 MED ORDER — POTASSIUM CHLORIDE 10 MEQ/100ML IV SOLN
10.0000 meq | INTRAVENOUS | Status: AC
  Administered 2022-05-07 (×2): 10 meq via INTRAVENOUS
  Filled 2022-05-07 (×2): qty 100

## 2022-05-07 MED ORDER — ORAL CARE MOUTH RINSE
15.0000 mL | OROMUCOSAL | Status: DC
  Administered 2022-05-07 – 2022-05-09 (×16): 15 mL via OROMUCOSAL

## 2022-05-07 MED ORDER — CHLORHEXIDINE GLUCONATE CLOTH 2 % EX PADS: 6.0000 | MEDICATED_PAD | Freq: Every day | CUTANEOUS | Status: DC

## 2022-05-07 MED ORDER — ACETAMINOPHEN 325 MG PO TABS: 650.0000 mg | ORAL_TABLET | ORAL | Status: DC | PRN

## 2022-05-07 MED ORDER — PROPOFOL 1000 MG/100ML IV EMUL
5.0000 ug/kg/min | INTRAVENOUS | Status: DC
  Administered 2022-05-07: 10 ug/kg/min via INTRAVENOUS
  Administered 2022-05-08: 30 ug/kg/min via INTRAVENOUS
  Filled 2022-05-07: qty 100

## 2022-05-07 MED ORDER — SODIUM CHLORIDE 0.9 % IV SOLN
2.0000 g | INTRAVENOUS | Status: AC
  Administered 2022-05-08 – 2022-05-11 (×4): 2 g via INTRAVENOUS
  Filled 2022-05-07 (×4): qty 20

## 2022-05-07 MED ORDER — ASPIRIN 81 MG PO CHEW
81.0000 mg | CHEWABLE_TABLET | Freq: Every day | ORAL | Status: DC
  Administered 2022-05-08 – 2022-05-09 (×2): 81 mg
  Filled 2022-05-07 (×2): qty 1

## 2022-05-07 MED ORDER — IPRATROPIUM-ALBUTEROL 0.5-2.5 (3) MG/3ML IN SOLN: 3.0000 mL | Freq: Four times a day (QID) | RESPIRATORY_TRACT | Status: DC | PRN

## 2022-05-07 MED ORDER — LACTATED RINGERS IV BOLUS
20.0000 mL/kg | Freq: Once | INTRAVENOUS | Status: AC
  Administered 2022-05-07: 1224 mL via INTRAVENOUS

## 2022-05-07 MED ORDER — FENTANYL CITRATE PF 50 MCG/ML IJ SOSY
25.0000 ug | PREFILLED_SYRINGE | INTRAMUSCULAR | Status: DC | PRN
  Administered 2022-05-08 – 2022-05-09 (×4): 50 ug via INTRAVENOUS
  Filled 2022-05-07: qty 1
  Filled 2022-05-07: qty 2
  Filled 2022-05-07 (×2): qty 1

## 2022-05-07 MED ORDER — ORAL CARE MOUTH RINSE: 15.0000 mL | OROMUCOSAL | Status: DC | PRN

## 2022-05-07 MED ORDER — ADULT MULTIVITAMIN LIQUID CH
15.0000 mL | Freq: Every day | ORAL | Status: DC
Start: 2022-05-08 — End: 2022-05-08
  Administered 2022-05-08: 15 mL
  Filled 2022-05-07: qty 15

## 2022-05-07 MED ORDER — LORATADINE 10 MG PO TABS
10.0000 mg | ORAL_TABLET | Freq: Every day | ORAL | Status: DC
Start: 2022-05-08 — End: 2022-05-09
  Administered 2022-05-08 – 2022-05-09 (×2): 10 mg
  Filled 2022-05-07 (×2): qty 1

## 2022-05-07 MED ORDER — ETOMIDATE 2 MG/ML IV SOLN
INTRAVENOUS | Status: DC | PRN
  Administered 2022-05-07: 20 mg via INTRAVENOUS

## 2022-05-07 MED ORDER — VANCOMYCIN 50 MG/ML ORAL SOLUTION
125.0000 mg | Freq: Four times a day (QID) | ORAL | Status: DC
  Administered 2022-05-07 – 2022-05-09 (×7): 125 mg
  Filled 2022-05-07 (×9): qty 2.5

## 2022-05-07 MED ORDER — NIACIN 100 MG PO TABS
100.0000 mg | ORAL_TABLET | Freq: Two times a day (BID) | ORAL | Status: DC
  Administered 2022-05-07 – 2022-05-09 (×4): 100 mg
  Filled 2022-05-07 (×5): qty 1

## 2022-05-07 MED ORDER — VANCOMYCIN HCL 125 MG PO CAPS
125.0000 mg | ORAL_CAPSULE | Freq: Four times a day (QID) | ORAL | Status: DC
  Filled 2022-05-07: qty 1

## 2022-05-07 MED ORDER — DEXTROSE IN LACTATED RINGERS 5 % IV SOLN: INTRAVENOUS | Status: DC

## 2022-05-07 MED ORDER — POLYETHYLENE GLYCOL 3350 17 G PO PACK: 17.0000 g | PACK | Freq: Every day | ORAL | Status: DC | PRN

## 2022-05-07 MED ORDER — SODIUM CHLORIDE 0.9 % IV BOLUS
1000.0000 mL | Freq: Once | INTRAVENOUS | Status: AC
  Administered 2022-05-07: 1000 mL via INTRAVENOUS

## 2022-05-07 MED ORDER — CARVEDILOL 12.5 MG PO TABS
25.0000 mg | ORAL_TABLET | Freq: Two times a day (BID) | ORAL | Status: DC
  Administered 2022-05-08 – 2022-05-09 (×3): 25 mg
  Filled 2022-05-07 (×3): qty 2

## 2022-05-07 MED ORDER — CLOPIDOGREL BISULFATE 75 MG PO TABS
75.0000 mg | ORAL_TABLET | Freq: Every day | ORAL | Status: DC
  Administered 2022-05-08 – 2022-05-09 (×2): 75 mg
  Filled 2022-05-07 (×2): qty 1

## 2022-05-07 MED ORDER — ISOSORBIDE MONONITRATE ER 30 MG PO TB24: 30.0000 mg | ORAL_TABLET | Freq: Every day | ORAL | Status: DC

## 2022-05-07 MED ORDER — DOCUSATE SODIUM 50 MG/5ML PO LIQD: 100.0000 mg | Freq: Two times a day (BID) | ORAL | Status: DC | PRN

## 2022-05-07 MED ORDER — VITAL HIGH PROTEIN PO LIQD
1000.0000 mL | ORAL | Status: DC
Start: 2022-05-07 — End: 2022-05-08

## 2022-05-07 MED ORDER — LACTATED RINGERS IV SOLN: INTRAVENOUS | Status: DC

## 2022-05-07 MED ORDER — VITAMIN D 25 MCG (1000 UNIT) PO TABS
1000.0000 [IU] | ORAL_TABLET | Freq: Every day | ORAL | Status: DC
Start: 2022-05-08 — End: 2022-05-09
  Administered 2022-05-08 – 2022-05-09 (×2): 1000 [IU]
  Filled 2022-05-07 (×2): qty 1

## 2022-05-07 MED ORDER — HYDRALAZINE HCL 50 MG PO TABS
100.0000 mg | ORAL_TABLET | Freq: Three times a day (TID) | ORAL | Status: DC
  Administered 2022-05-07 – 2022-05-09 (×6): 100 mg
  Filled 2022-05-07 (×6): qty 2

## 2022-05-07 MED ORDER — CALCIUM GLUCONATE-NACL 1-0.675 GM/50ML-% IV SOLN
1.0000 g | Freq: Once | INTRAVENOUS | Status: AC
Start: 2022-05-07 — End: 2022-05-07
  Administered 2022-05-07: 1000 mg via INTRAVENOUS
  Filled 2022-05-07: qty 50

## 2022-05-07 MED ORDER — GABAPENTIN 250 MG/5ML PO SOLN
100.0000 mg | Freq: Every day | ORAL | Status: DC
Start: 2022-05-08 — End: 2022-05-09
  Administered 2022-05-08: 100 mg
  Filled 2022-05-07 (×2): qty 2

## 2022-05-07 MED ORDER — ISOSORBIDE DINITRATE 20 MG PO TABS: 10.0000 mg | ORAL_TABLET | Freq: Three times a day (TID) | ORAL | Status: DC

## 2022-05-07 MED ORDER — CLONIDINE HCL 0.1 MG PO TABS
0.3000 mg | ORAL_TABLET | Freq: Three times a day (TID) | ORAL | Status: DC
  Administered 2022-05-07 – 2022-05-09 (×5): 0.3 mg
  Filled 2022-05-07 (×5): qty 1

## 2022-05-07 MED ORDER — SODIUM CHLORIDE 0.9 % IV BOLUS: 2000.0000 mL | Freq: Once | INTRAVENOUS | Status: DC

## 2022-05-07 MED ORDER — CLEVIDIPINE BUTYRATE 0.5 MG/ML IV EMUL
0.0000 mg/h | INTRAVENOUS | Status: DC
  Administered 2022-05-07: 2 mg/h via INTRAVENOUS
  Filled 2022-05-07: qty 50

## 2022-05-07 MED ORDER — ROCURONIUM BROMIDE 50 MG/5ML IV SOLN
INTRAVENOUS | Status: DC | PRN
  Administered 2022-05-07: 100 mg via INTRAVENOUS

## 2022-05-07 MED ORDER — INSULIN REGULAR(HUMAN) IN NACL 100-0.9 UT/100ML-% IV SOLN
INTRAVENOUS | Status: AC
  Administered 2022-05-07: 4 [IU]/h via INTRAVENOUS
  Filled 2022-05-07: qty 100

## 2022-05-07 MED ORDER — LEVETIRACETAM IN NACL 1000 MG/100ML IV SOLN
1000.0000 mg | Freq: Once | INTRAVENOUS | Status: AC
  Administered 2022-05-07: 1000 mg via INTRAVENOUS

## 2022-05-07 MED ORDER — VANCOMYCIN HCL 750 MG/150ML IV SOLN
750.0000 mg | INTRAVENOUS | Status: DC
Start: 2022-05-09 — End: 2022-05-08

## 2022-05-07 MED ORDER — PROSOURCE TF PO LIQD
45.0000 mL | Freq: Two times a day (BID) | ORAL | Status: DC
  Administered 2022-05-07 – 2022-05-08 (×2): 45 mL
  Filled 2022-05-07 (×2): qty 45

## 2022-05-07 MED ORDER — FENTANYL CITRATE PF 50 MCG/ML IJ SOSY: 25.0000 ug | PREFILLED_SYRINGE | INTRAMUSCULAR | Status: DC | PRN

## 2022-05-07 MED ORDER — POTASSIUM CHLORIDE 20 MEQ PO PACK
40.0000 meq | PACK | Freq: Once | ORAL | Status: AC
Start: 2022-05-07 — End: 2022-05-07
  Administered 2022-05-07: 40 meq
  Filled 2022-05-07: qty 2

## 2022-05-07 MED ORDER — IPRATROPIUM-ALBUTEROL 0.5-2.5 (3) MG/3ML IN SOLN
3.0000 mL | Freq: Four times a day (QID) | RESPIRATORY_TRACT | Status: DC
  Administered 2022-05-07 – 2022-05-08 (×3): 3 mL via RESPIRATORY_TRACT
  Filled 2022-05-07 (×3): qty 3

## 2022-05-07 MED ORDER — SODIUM BICARBONATE 650 MG PO TABS
1300.0000 mg | ORAL_TABLET | Freq: Two times a day (BID) | ORAL | Status: DC
Start: 2022-05-07 — End: 2022-05-09
  Administered 2022-05-07 – 2022-05-09 (×4): 1300 mg
  Filled 2022-05-07 (×4): qty 2

## 2022-05-07 MED ORDER — HEPARIN SODIUM (PORCINE) 5000 UNIT/ML IJ SOLN
5000.0000 [IU] | Freq: Three times a day (TID) | INTRAMUSCULAR | Status: DC
Start: 2022-05-07 — End: 2022-05-12
  Administered 2022-05-07 – 2022-05-12 (×14): 5000 [IU] via SUBCUTANEOUS
  Filled 2022-05-07 (×14): qty 1

## 2022-05-07 NOTE — Progress Notes (Signed)
RT transported pt to [facility] 21 without event.

## 2022-05-07 NOTE — ED Provider Notes (Signed)
[facility] EMERGENCY DEPARTMENT Provider Note   CSN: [id number] Arrival date & time: 05/07/22  1928     History {Add pertinent medical, surgical, social history, OB history to HPI:1} Chief Complaint  Patient presents with  . Seizures    Manuel Kramer is a 66 y.o. male.   Seizures  Patient is a 66 year old male with a history of DM 2, HTN, HFpEF (60-65% 11/18/21), CKD 3B, C. difficile colitis, HLD, anemia of chronic disease who presents to the emergency department via EMS for evaluation of seizures.  Per EMS patient is a resident at [facility] and was initially called out by EMS for CPR at the facility.  When EMS arrived no compressions had been performed and they were just worried that the patient was not breathing.  Patient had what looked like was seizure-like activity while with EMS and they provided him with 5 mg IM midazolam which appeared to stop the seizure-like activity.  On arrival patient is minimally responsive and thus history is otherwise limited secondary to patient condition.  Attempts at calling facility were also limited in adding additional information to the patient's last known well.     Home Medications Prior to Admission medications   Medication Sig Start Date End Date Taking? Authorizing Provider  amLODipine (NORVASC) 10 MG tablet Take 1 tablet (10 mg total) by mouth daily. 01/05/17  Yes Charlott Rakes, MD  aspirin EC 81 MG tablet Take 81 mg by mouth daily. Swallow whole.   Yes [provider]  atorvastatin (LIPITOR) 40 MG tablet Take 1 tablet (40 mg total) by mouth daily. 12/06/21  Yes Alma Friendly, MD  carvedilol (COREG) 25 MG tablet Take 1 tablet (25 mg total) by mouth 2 (two) times daily with a meal. 12/05/21  Yes Alma Friendly, MD  cholecalciferol (VITAMIN D3) 25 MCG (1000 UNIT) tablet Take 1,000 Units by mouth daily.   Yes [provider]  Cinnamon 500 MG capsule Take 500 mg by mouth daily.   Yes [provider]  cloNIDine (CATAPRES) 0.3 MG tablet Take 1 tablet (0.3 mg total) by mouth 3 (three) times daily. 02/02/22  Yes Rai, Ripudeep K, MD  clopidogrel (PLAVIX) 75 MG tablet Take 75 mg by mouth daily.   Yes [provider]  DESITIN 40 % PSTE Apply 1 Application topically in the morning and at bedtime. 04/24/22  Yes [provider]  gabapentin (NEURONTIN) 100 MG capsule Take 100 mg by mouth at bedtime.   Yes [provider]  Garlic 841 MG CAPS Take 1 capsule by mouth daily.   Yes [provider]  hydrALAZINE (APRESOLINE) 100 MG tablet Take 1 tablet (100 mg total) by mouth every 8 (eight) hours. 12/05/21  Yes Alma Friendly, MD  Infant Care Products [facility]) OINT Apply 1 Application topically in the morning, at noon, and at bedtime. 02/04/22  Yes [provider]  insulin glargine (LANTUS) 100 UNIT/ML Solostar Pen Inject 8 Units into the skin at bedtime. Patient taking differently: Inject 10 Units into the skin at bedtime. 02/02/22  Yes Rai, Ripudeep K, MD  ipratropium (ATROVENT) 0.06 % nasal spray Place 2 sprays into both nostrils 2 (two) times daily.   Yes [provider]  isosorbide mononitrate (IMDUR) 30 MG 24 hr tablet Take 1 tablet (30 mg total) by mouth daily. 02/03/22  Yes Rai, Ripudeep K, MD  loratadine (CLARITIN) 10 MG tablet Take 10 mg by mouth daily.   Yes [provider]  losartan (COZAAR) 50 MG tablet Take 50 mg by mouth daily. 04/30/22  Yes [provider]  Multiple Vitamin (MULTIVITAMIN WITH MINERALS) TABS tablet Take 1 tablet by mouth daily. 12/06/21  Yes Alma Friendly, MD  niacin 100 MG tablet Take 100 mg by mouth 2 (two) times daily.   Yes [provider]  sertraline (ZOLOFT) 50 MG tablet Take 50 mg by mouth daily. 04/17/22  Yes [provider]  sodium bicarbonate 650 MG tablet Take 1,300 mg by mouth 2 (two) times daily.   Yes [provider]  torsemide (DEMADEX) 20 MG tablet  Take 20 mg by mouth daily. 04/17/22  Yes [provider]  vancomycin (VANCOCIN) 125 MG capsule Take 125 mg by mouth 4 (four) times daily.   Yes [provider]  acetaminophen (TYLENOL) 500 MG tablet Take 1 tablet (500 mg total) by mouth every 6 (six) hours as needed. Patient not taking: Reported on 01/26/2022 08/22/16   Charlann Lange, PA-C  sertraline (ZOLOFT) 25 MG tablet Take 1 tablet (25 mg total) by mouth daily. Patient not taking: Reported on 05/07/2022 02/02/22   Mendel Corning, MD      Allergies    Codeine, Iodinated contrast media, Pentazocine, and Statins    Review of Systems   Review of Systems  Neurological:  Positive for seizures.    Physical Exam Updated Vital Signs BP (!) 256/122   Pulse 89   Temp 98.1 F (36.7 C) (Oral)   Resp 20   Ht _0  (1.575 m)   Wt 61.2 kg   SpO2 100%   BMI 24.68 kg/m  Physical Exam Vitals and nursing note reviewed.  Constitutional:      Appearance: He is well-developed. He is ill-appearing and toxic-appearing.  HENT:     Head: Atraumatic.     Right Ear: External ear normal.     Left Ear: External ear normal.     Nose: Nose normal.     Mouth/Throat:     Mouth: Mucous membranes are moist.     Pharynx: Oropharynx is clear.     Comments: Edentulous Eyes:     Conjunctiva/sclera: Conjunctivae normal.     Comments: Initial gaze deviation to the left and then some intermittent gaze deviation to the right.  Pupils remained reactive to light and equal bilaterally.  Cardiovascular:     Rate and Rhythm: Normal rate and regular rhythm.     Pulses: Normal pulses.     Heart sounds: Normal heart sounds. No murmur heard. Pulmonary:     Effort: Pulmonary effort is normal. No respiratory distress.     Breath sounds: Rales present.  Abdominal:     Palpations: Abdomen is soft.     Tenderness: There is no abdominal tenderness.  Musculoskeletal:        General: No swelling.     Cervical back: Neck supple.     Right lower leg:  Edema (1+) present.     Left lower leg: Edema (1+) present.  Skin:    General: Skin is warm and dry.     Capillary Refill: Capillary refill takes less than 2 seconds.  Neurological:     GCS: GCS eye subscore is 4. GCS verbal subscore is 1. GCS motor subscore is 2.  Psychiatric:        Mood and Affect: Mood normal.    ED Results / Procedures / Treatments   Labs (all labs ordered are listed, but only abnormal results are displayed) Labs Reviewed  CBC - Abnormal; Notable for the following components:      Result Value   RBC 4.07 (*)    Hemoglobin 11.9 (*)    HCT 36.9 (*)    All other components within normal limits  I-STAT CHEM 8, ED - Abnormal; Notable for the following components:   BUN 30 (*)    Creatinine, Ser 2.10 (*)    Glucose, Bld >700 (*)    Calcium, Ion 1.13 (*)    Hemoglobin 12.9 (*)    HCT 38.0 (*)    All other components within normal limits  RESP PANEL BY RT-PCR (FLU A&B, COVID) ARPGX2  PROTIME-INR  APTT  DIFFERENTIAL  ETHANOL  COMPREHENSIVE METABOLIC PANEL  RAPID URINE DRUG SCREEN, HOSP PERFORMED  URINALYSIS, ROUTINE W REFLEX MICROSCOPIC  OSMOLALITY  BLOOD GAS, VENOUS  BLOOD GAS, ARTERIAL  CBG MONITORING, ED    EKG EKG Interpretation  Date/Time:  Sunday May 07 2022 19:38:08 EDT Ventricular Rate:  95 PR Interval:  126 QRS Duration: 102 QT Interval:  397 QTC Calculation: 500 R Axis:   10 Text Interpretation: Sinus rhythm Consider right atrial enlargement Borderline repolarization abnormality Borderline prolonged QT interval Confirmed by Wandra Arthurs [phone]) on 05/07/2022 7:53:16 PM  Radiology CT HEAD WO CONTRAST (5MM)  Result Date: 05/07/2022 CLINICAL DATA:  Seizure, new-onset, no history of trauma EXAM: CT HEAD WITHOUT CONTRAST TECHNIQUE: Contiguous axial images were obtained from the base of the skull through the vertex without intravenous contrast. RADIATION DOSE REDUCTION: This exam was performed according to the departmental dose-optimization  program which includes automated exposure control, adjustment of the mA and/or kV according to patient size and/or use of iterative reconstruction technique. COMPARISON:  01/25/2022 FINDINGS: Brain: There is atrophy and chronic small vessel disease changes. No acute intracranial abnormality. Specifically, no hemorrhage, hydrocephalus, mass lesion, acute infarction, or significant intracranial injury. Vascular: No hyperdense vessel or unexpected calcification. Skull: No acute calvarial abnormality. Sinuses/Orbits: No acute findings Other: None IMPRESSION: Atrophy, chronic microvascular disease. No acute intracranial abnormality. Electronically Signed   By: Rolm Baptise M.D.   On: 05/07/2022 20:11    Procedures Procedure Name: Intubation Date/Time: 05/07/2022 8:34 PM  Performed by: Nelta Numbers, MDPre-anesthesia Checklist: Patient identified and Emergency Drugs available Oxygen Delivery Method: Ambu bag Preoxygenation: Pre-oxygenation with 100% oxygen Induction Type: Rapid sequence Ventilation: Mask ventilation without difficulty and Nasal airway inserted- appropriate to patient size Laryngoscope Size: Mac and 3 Grade View: Grade II Tube size: 7.5 mm Number of attempts: 1 Airway Equipment and Method: Bougie stylet Placement Confirmation: ETT inserted through vocal cords under direct vision, Positive ETCO2, CO2 detector and Breath sounds checked- equal and bilateral Secured at: 21 cm Tube secured with: ETT holder     {Document cardiac monitor, telemetry assessment procedure when appropriate:1}  Medications Ordered in ED Medications  insulin regular, human (MYXREDLIN) 100 units/ 100 mL infusion (4 Units/hr Intravenous New Bag/Given 05/07/22 2045)  lactated ringers infusion (0 mLs Intravenous Hold 05/07/22 2040)  dextrose 5 % in lactated ringers infusion (0 mLs Intravenous Hold 05/07/22 2035)  dextrose 50 % solution 0-50 mL (has no administration in time range)  potassium chloride 10 mEq in 100  mL IVPB (10 mEq Intravenous New Bag/Given 05/07/22 2046)  etomidate (AMIDATE) injection (20 mg Intravenous Given 05/07/22 2020)  rocuronium (ZEMURON) injection (100 mg Intravenous Given 05/07/22 2021)  ipratropium-albuterol (DUONEB) 0.5-2.5 (3) MG/3ML nebulizer solution 3 mL (has no administration in time range)  clevidipine (CLEVIPREX) infusion 0.5 mg/mL (has no administration in time  range)  propofol (DIPRIVAN) 1000 MG/100ML infusion (has no administration in time range)  levETIRAcetam (KEPPRA) IVPB 1000 mg/100 mL premix (0 mg Intravenous Stopped 05/07/22 2016)  sodium chloride 0.9 % bolus 1,000 mL (0 mLs Intravenous Stopped 05/07/22 2054)  lactated ringers bolus 1,224 mL (1,224 mLs Intravenous New Bag/Given 05/07/22 2039)  levETIRAcetam (KEPPRA) IVPB 1000 mg/100 mL premix (0 mg Intravenous Stopped 05/07/22 2025)    ED Course/ Medical Decision Making/ A&P                           Medical Decision Making Amount and/or Complexity of Data Reviewed Independent Historian: EMS External Data Reviewed: labs and notes. Labs: ordered. Radiology: ordered. ECG/medicine tests: ordered.  Risk Prescription drug management.   Patient is a 66 year old male presents emergency department as above.  On initial presentation patient is slightly tachypneic at 21 but is saturating 91% on several liters of oxygen.  Per EMS patient had had seizure-like activity in route and had been given IM midazolam.  This did cause some improvement of the patient's symptoms.  On his arrival to [place] he did appear to have some decerebrate posturing which was either from posturing versus seizure-like activity.  For this reason he was taken quickly to the CT scanner for a CTA of the head and neck as well as a CT head.  The CT head did not show any acute bleeds.  An initial VBG was ordered which did show glucose greater than 700.  His bicarb was within normal limits which raised suspicion for HHS.  Low suspicion for DKA.  However given patient's  GCS was near 7 the decision was ultimately made to intubate this patient.  {Document critical care time when appropriate:1} {Document review of labs and clinical decision tools ie heart score, Chads2Vasc2 etc:1}  {Document your independent review of radiology images, and any outside records:1} {Document your discussion with family members, caretakers, and with consultants:1} {Document social determinants of health affecting pt's care:1} {Document your decision making why or why not admission, treatments were needed:1} Final Clinical Impression(s) / ED Diagnoses Final diagnoses:  None    Rx / DC Orders ED Discharge Orders     None

## 2022-05-07 NOTE — ED Triage Notes (Signed)
Pt BIB EMS from [facility], initially called out for CPR facility then stated pt had what looked like seizure like activity, pulses present on EMS arrival. CBG read "high". Pt had seizure while in route with EMS for approx. 1 minute. 5mg  IM midazolam given, seizure activity stopped.

## 2022-05-07 NOTE — Progress Notes (Signed)
Pharmacy Antibiotic Note  Manuel Kramer is a 66 y.o. male admitted on 05/07/2022 presenting with seizure activity, concern for sepsis.  Pharmacy has been consulted for vancomycin dosing.  Plan: Vancomycin 1250 mg IV x 1, then 750 mg IV q 36h (eAUC 422, SCr 2.1) Monitor renal function, Cx and clinical progression to narrow Vancomycin levels as needed  Height: 5\' 2"  (157.5 cm) Weight: 61.2 kg (134 lb 14.7 oz) IBW/kg (Calculated) : 54.6  Temp (24hrs), Avg:98.1 F (36.7 C), Min:98.1 F (36.7 C), Max:98.1 F (36.7 C)  Recent Labs  Lab 05/07/22 1948 05/07/22 1958  WBC 9.1  --   CREATININE 2.17* 2.10*    Estimated Creatinine Clearance: 27.1 mL/min (A) (by C-G formula based on SCr of 2.1 mg/dL (H)).    Allergies  Allergen Reactions   Codeine Other (See Comments)    On MAR, unknown reaction   Iodinated Contrast Media Other (See Comments)    On MAR, unknown reaction   Pentazocine Other (See Comments)    On MAR, unknown reaction   Statins Other (See Comments)    On MAR, unknown reaction    07/08/22, PharmD Clinical Pharmacist ED Pharmacist Phone # [phone] 05/07/2022 9:37 PM

## 2022-05-07 NOTE — Progress Notes (Addendum)
eLink Physician-Brief Progress Note Patient Name: Manuel Kramer DOB: 1956/03/10 MRN: [id number]   Date of Service  05/07/2022  HPI/Events of Note  66/M with DM, hypertension, recent C diff infection, brought in from SNF due to altered mental status.  Pt reportedly found unconscious and was noted by EMS to have seizure-like activity.  Pt intubated in the ED for airway protection.  Pt noted to be hyperglycemic with glucoses in the 800s, and also hypertensive with SBP in the 230s. Neurology consulted.    Pt intubated and sedated.  BP 160/41, HR 76, RR 20, O2 sats 100%  Lactate 1.1, K 2.8.  eICU Interventions  Acute encephalopathy Seizures HHS Acute respiratory failure AKI on CKD C diff infection Hypokalemia  Plan> Encephalopathy may be from toxic metabolic encephalopathy, hypertensive crisis, probable seizures.  BP control with goal 160-180. Continue ceribell monitoring.  Pt has been loaded with Keppra.  Continue empiric antibiotics for sepsis.  Continue insulin gtt.  Hourly glucose checks.  Replete K - pt getting via tube now. IV KCL given earlier.  Repeat BMP at midnight.  Continue PO vancomycin for C diff.  Sedate with propofol and fentanyl gtt.  CTA head and neck ordered.  CT chest ordered.  Famotidine for GI prophylaxis.  Heparin for DVT Prophylaxis.  Wrist restraints ordered.  Foley cath ordered.      Intervention Category Evaluation Type: New Patient Evaluation  Larinda Buttery 05/07/2022, 10:36 PM

## 2022-05-07 NOTE — H&P (Addendum)
NAME:  Manuel Kramer, MRN:  [id number], DOB:  Mar 04, 1956, LOS: 0 ADMISSION DATE:  05/07/2022, CONSULTATION DATE:  05/07/22 REFERRING MD:  Jamal Maes, CHIEF COMPLAINT:  acute encephalopathy  History of Present Illness:  [name] is a 66 year old gentleman who resides recently at [facility] who was brought to the ED for confusion.  He was found unconscious for an unknown amount of time this morning.  Although he the initial call was that he was receiving CPR, when EMS arrived he had not received chest compressions.  There was a concern about abnormal breathing.  With EMS he demonstrated seizure-like activity and received midazolam.  Upon arrival to the ED he had abnormal gaze deviation and posturing, raising concern for ongoing seizures.  He underwent CTA to rule out acute bleed and neurology was consulted.  Cerebellum was placed in the ED without concern for ongoing seizures.  Due to altered mental status he was intubated for airway protection.  He was found to have a blood glucose greater than 600 on i-STAT and has been started on IV insulin.  He was undergoing treatment for C. difficile colitis at SNF.  He was discharged from the hospital in April with ongoing treatment.  Pertinent  Medical History  Recent C. difficile infection Diabetes Hypertension CKD 3B Hyperlipidemia HFpEF Deconditioning  Significant Hospital Events: Including procedures, antibiotic start and stop dates in addition to other pertinent events   Admitted, intubated, started on antiepileptics, insulin, and antibiotics  Interim History / Subjective:    Objective   Blood pressure (!) 256/122, pulse 89, temperature 98.1 F (36.7 C), temperature source Oral, resp. rate 20, height 5\' 2"  (1.575 m), weight 61.2 kg, SpO2 100 %.    Vent Mode: PRVC FiO2 (%):  [40 %-100 %] 40 % Set Rate:  [20 bmp] 20 bmp Vt Set:  [430 mL] 430 mL PEEP:  [5 cmH20] 5 cmH20 Plateau Pressure:  [19 cmH20] 19 cmH20   Intake/Output Summary  (Last 24 hours) at 05/07/2022 2126 Last data filed at 05/07/2022 2054 Gross per 24 hour  Intake 1000 ml  Output --  Net 1000 ml   Filed Weights   05/07/22 1934  Weight: 61.2 kg    Examination: General: Critically ill-appearing man lying in bed no acute distress, intubated, sedated, very hypertensive HENT: /AT, eyes anicteric, endotracheal tube in place. Lungs: CTA B, synchronous with the vent Cardiovascular: S1-S2, regular rate and rhythm Abdomen: Soft, nontender, nondistended.  Hypoactive bowel sounds Extremities: Pitting edema bilateral shins, no cyanosis Neuro: RASS -5, delayed cough reflex after suctioning.  No withdrawal from pain.  Pupils sluggishly reactive Derm: Warm, dry, no diffuse rashes  COVID test pending Blood glucose 834 BUN 29 Creatinine 2.17 Potassium 3.4  Resolved Hospital Problem list     Assessment & Plan:  Acute encephalopathy, concern for hyperglycemic crisis causing seizures.  Less likely would be sepsis.  No real concern for meningitis with out prodromal symptoms - Reviewed case with neurology-no indication for broad meningitis or encephalitis antimicrobial coverage at this point. - Cerebel monitoring - Antiepileptics per neurology - Needs volume resuscitation and control of hyperglycemia to correct metabolic derangements -Seizure precautions -Rule out sepsis-cultures, UA, empiric antibiotics.  Would like to minimize antibiotic course is much as feasible with history of C. difficile. -Need to continue PTA gabapentin at reduced dose to prevent withdrawal--can start tomorrow.  Acute respiratory failure with hypoxia requiring mechanical ventilation - LTVV - VAP prevention protocol - PAD protocol for sedation-propofol preferred given concern for seizures -  Daily SAT and SBT as appropriate.  Mental status currently precludes extubation  Hyperglycemic crisis, HHS.  Not sure if medications have been held to precipitate hyperglycemic crisis. - Insulin  drip per Endo tool protocol - Additional volume expansion, additional 2L IVF + maintenance fluids per hyperglycemic protocol  Hypertensive emergency, not sure if this is potentially contributing to encephalopathy as well - Cleviprex - Goal SBP ~160- 180 (current pressures in 230s).  Communicated this to RN in the ED. -Can resume home Coreg, hydralazine, clonidine, amlodipine.  Holding PTA diuretic and ARB.  Abnormal chest x-ray-mediastinal widening is concerning, especially with hypertensive emergency - Starting with noncontrast CT, but may require a CT with contrast if concern remains with additional imaging  C. difficile infection, ongoing treatment at SNF - Continue vancomycin 4 times daily -Pepcid for GI prophylaxis - Minimize systemic antibiotics is much as feasible -Precautions ordered  Hypokalemia - Repleted - Continue to monitor  AKI on CKD 3B - Volume resuscitation - Continue to monitor - Renally dose meds and avoid nephrotoxic meds were able - Strict I's/O -Hold PTA diuretic and ARB  Hypocalcemia - Repleted  Chronic anemia, likely due to chronic illness - Transfuse for hemoglobin less than 7 or hemodynamically significant bleeding - Monitor   Best Practice (right click and "Reselect all SmartList Selections" daily)   Diet/type: tubefeeds DVT prophylaxis: prophylactic heparin  GI prophylaxis: H2B Lines: N/A Foley:  N/A Code Status:  full code Last date of multidisciplinary goals of care discussion [ ]   Labs   CBC: Recent Labs  Lab 05/07/22 1948 05/07/22 1958 05/07/22 2104  WBC 9.1  --   --   NEUTROABS 7.5  --   --   HGB 11.9* 12.9* 11.9*  HCT 36.9* 38.0* 35.0*  MCV 90.7  --   --   PLT 296  --   --     Basic Metabolic Panel: Recent Labs  Lab 05/07/22 1948 05/07/22 1958 05/07/22 2104  NA 135 135 136  K 3.4* 3.5 2.8*  CL 98 98  --   CO2 22  --   --   GLUCOSE 834* >700*  --   BUN 29* 30*  --   CREATININE 2.17* 2.10*  --   CALCIUM 8.8*  --    --    GFR: Estimated Creatinine Clearance: 27.1 mL/min (A) (by C-G formula based on SCr of 2.1 mg/dL (H)). Recent Labs  Lab 05/07/22 1948  WBC 9.1    Liver Function Tests: Recent Labs  Lab 05/07/22 1948  AST 21  ALT 33  ALKPHOS 115  BILITOT 0.7  PROT 6.3*  ALBUMIN 2.8*   No results for input(s): "LIPASE", "AMYLASE" in the last 168 hours. No results for input(s): "AMMONIA" in the last 168 hours.  ABG    Component Value Date/Time   PHART 7.474 (H) 05/07/2022 2104   PCO2ART 39.0 05/07/2022 2104   PO2ART 433 (H) 05/07/2022 2104   HCO3 28.7 (H) 05/07/2022 2104   TCO2 30 05/07/2022 2104   O2SAT 100 05/07/2022 2104     Coagulation Profile: Recent Labs  Lab 05/07/22 1948  INR 0.9    Cardiac Enzymes: No results for input(s): "CKTOTAL", "CKMB", "CKMBINDEX", "TROPONINI" in the last 168 hours.  HbA1C: Hgb A1c MFr Bld  Date/Time Value Ref Range Status  01/26/2022 04:32 AM 5.2 4.8 - 5.6 % Final    Comment:    (NOTE) Pre diabetes:          5.7%-6.4%  Diabetes:              >  6.4%  Glycemic control for   <7.0% adults with diabetes   11/14/2021 12:19 PM 9.9 (H) 4.8 - 5.6 % Final    Comment:    (NOTE) Pre diabetes:          5.7%-6.4%  Diabetes:              >6.4%  Glycemic control for   <7.0% adults with diabetes     CBG: Recent Labs  Lab 05/07/22 2104  GLUCAP >600*    Review of Systems:   Unable to be obtained due to intubated and sedated status.  Past Medical History:  He,  has a past medical history of Chronic diarrhea, Diabetes mellitus without complication (HCC), and Hypertension.   Surgical History:   Past Surgical History:  Procedure Laterality Date   COLOSCOPY      POLYP     Social History:   reports that he has been smoking cigarettes. He has a 20.00 pack-year smoking history. He has quit using smokeless tobacco. He reports that he does not currently use alcohol after a past usage of about 1.0 standard drink of alcohol per week. He  reports that he does not use drugs.   Family History:  His family history includes Cancer in his father and mother; Diabetes in his mother; Hypertension in his mother.   Allergies Allergies  Allergen Reactions   Codeine Other (See Comments)    On MAR, unknown reaction   Iodinated Contrast Media Other (See Comments)    On MAR, unknown reaction   Pentazocine Other (See Comments)    On MAR, unknown reaction   Statins Other (See Comments)    On MAR, unknown reaction     Home Medications  Prior to Admission medications   Medication Sig Start Date End Date Taking? Authorizing Provider  amLODipine (NORVASC) 10 MG tablet Take 1 tablet (10 mg total) by mouth daily. 01/05/17  Yes Hoy Register, MD  aspirin EC 81 MG tablet Take 81 mg by mouth daily. Swallow whole.   Yes [provider]  atorvastatin (LIPITOR) 40 MG tablet Take 1 tablet (40 mg total) by mouth daily. 12/06/21  Yes Briant Cedar, MD  carvedilol (COREG) 25 MG tablet Take 1 tablet (25 mg total) by mouth 2 (two) times daily with a meal. 12/05/21  Yes Briant Cedar, MD  cholecalciferol (VITAMIN D3) 25 MCG (1000 UNIT) tablet Take 1,000 Units by mouth daily.   Yes [provider]  Cinnamon 500 MG capsule Take 500 mg by mouth daily.   Yes [provider]  cloNIDine (CATAPRES) 0.3 MG tablet Take 1 tablet (0.3 mg total) by mouth 3 (three) times daily. 02/02/22  Yes Rai, Ripudeep K, MD  clopidogrel (PLAVIX) 75 MG tablet Take 75 mg by mouth daily.   Yes [provider]  DESITIN 40 % PSTE Apply 1 Application topically in the morning and at bedtime. 04/24/22  Yes [provider]  gabapentin (NEURONTIN) 100 MG capsule Take 100 mg by mouth at bedtime.   Yes [provider]  Garlic 500 MG CAPS Take 1 capsule by mouth daily.   Yes [provider]  hydrALAZINE (APRESOLINE) 100 MG tablet Take 1 tablet (100 mg total) by mouth every 8 (eight) hours. 12/05/21  Yes Briant Cedar,  MD  [employer] [facility]) OINT Apply 1 Application topically in the morning, at noon, and at bedtime. 02/04/22  Yes [provider]  insulin glargine (LANTUS) 100 UNIT/ML Solostar Pen Inject 8 Units  into the skin at bedtime. Patient taking differently: Inject 10 Units into the skin at bedtime. 02/02/22  Yes Rai, Ripudeep K, MD  ipratropium (ATROVENT) 0.06 % nasal spray Place 2 sprays into both nostrils 2 (two) times daily.   Yes [provider]  isosorbide mononitrate (IMDUR) 30 MG 24 hr tablet Take 1 tablet (30 mg total) by mouth daily. 02/03/22  Yes Rai, Ripudeep K, MD  loratadine (CLARITIN) 10 MG tablet Take 10 mg by mouth daily.   Yes [provider]  losartan (COZAAR) 50 MG tablet Take 50 mg by mouth daily. 04/30/22  Yes [provider]  Multiple Vitamin (MULTIVITAMIN WITH MINERALS) TABS tablet Take 1 tablet by mouth daily. 12/06/21  Yes Briant Cedar, MD  niacin 100 MG tablet Take 100 mg by mouth 2 (two) times daily.   Yes [provider]  sertraline (ZOLOFT) 50 MG tablet Take 50 mg by mouth daily. 04/17/22  Yes [provider]  sodium bicarbonate 650 MG tablet Take 1,300 mg by mouth 2 (two) times daily.   Yes [provider]  torsemide (DEMADEX) 20 MG tablet Take 20 mg by mouth daily. 04/17/22  Yes [provider]  vancomycin (VANCOCIN) 125 MG capsule Take 125 mg by mouth 4 (four) times daily.   Yes [provider]  acetaminophen (TYLENOL) 500 MG tablet Take 1 tablet (500 mg total) by mouth every 6 (six) hours as needed. Patient not taking: Reported on 01/26/2022 08/22/16   Elpidio Anis, PA-C  sertraline (ZOLOFT) 25 MG tablet Take 1 tablet (25 mg total) by mouth daily. Patient not taking: Reported on 05/07/2022 02/02/22   Cathren Harsh, MD     Critical care time: 56 min.       Steffanie Dunn, DO 05/07/22 9:49 PM [facility]

## 2022-05-07 NOTE — Consult Note (Addendum)
Neurology Consultation Reason for Consult: Seizure Referring Physician: Silverio Lay, D  CC: Seizure  History is obtained from: Nursing home, referring providers  HPI: Manuel Kramer is a 66 y.o. male with a history of diarrhea, currently being managed for C. difficile who presents with seizures.  He was last seen well sometime today, though all the staff that know what time he was last seen have left for the day.  Around 6 PM, staff checked on him and found him to be shaking all over, and EMS was called.  EMS reported that he was having generalized tonic-clonic type convulsions and he was brought in emergently to the ED.  He apparently had cessation of tonic-clonic activity shortly before arrival, but on arrival was still severely encephalopathic with posturing to noxious stimulation.  At baseline, he has been needing help with essentially all of his activities of daily living, though he can usually feed himself.  LKW: Earlier today though unclear time tpa given?: no, unclear time of onset MRS: 5  Past Medical History:  Diagnosis Date   Chronic diarrhea    Diabetes mellitus without complication (HCC)    Hypertension      Family History  Problem Relation Age of Onset   Cancer Mother    Diabetes Mother    Hypertension Mother    Cancer Father      Social History:  reports that he has been smoking cigarettes. He has a 20.00 pack-year smoking history. He has quit using smokeless tobacco. He reports that he does not currently use alcohol after a past usage of about 1.0 standard drink of alcohol per week. He reports that he does not use drugs.   Exam: Current vital signs: BP (!) 190/99   Pulse 95   Temp 98.1 F (36.7 C) (Oral)   Resp 20   Ht 5\' 2"  (1.575 m)   Wt 61.2 kg   SpO2 98%   BMI 24.68 kg/m  Vital signs in last 24 hours: Temp:  [98.1 F (36.7 C)] 98.1 F (36.7 C) (07/09 2009) Pulse Rate:  [92-95] 95 (07/09 2009) Resp:  [19-20] 20 (07/09 2009) BP: (190)/(99) 190/99  (07/09 2000) SpO2:  [98 %] 98 % (07/09 2009) FiO2 (%):  [100 %] 100 % (07/09 2009) Weight:  [61.2 kg] 61.2 kg (07/09 1934)   Physical Exam  Constitutional: Appears well-developed and well-nourished.  Neuro: Mental Status: Patient is comatose, does not open eyes or follow commands. Cranial Nerves: II: He does not blink to threat. Pupils are equal, round, and reactive to light.   III,IV, VI: He has roving eye movements across midline in both directions V: VII: Corneals are absent Motor: He has extension to noxious stimulation of the left upper extremity, flexion of the right upper, minimal flexion bilaterally and lowers.  Subsequently after a few minutes, his exam does improve to spontaneous movements of both arms sensory: As above Cerebellar: Does not perform   I have reviewed labs in epic and the results pertinent to this consultation are: Glucose 834 Na 135 Ca 8.8 Cr 2.17 Hgb 11.9  I have reviewed the images obtained: CT-negative  Impression: 66 year old male with new onset seizures in the setting of severe hyperglycemia.  My suspicion is that this does represent seizure secondary to hyperosmolar state.  A stat cerebella was connected which does not show any ongoing seizures at this time, I will leave this connected overnight.  In the short-term, I would favor continuing antiepileptic and I will continue Keppra, but not  clear at this point that he will need it long-term.  Recommendations: 1) Keppra 500mg  BID 2) Continue EEG for now.  3) check magnesium.  4) If he has continued deficit or other concern tomorrow, would pursue MRI brain 5) will follow.   This patient is critically ill and at significant risk of neurological worsening, death and care requires constant monitoring of vital signs, hemodynamics,respiratory and cardiac monitoring, neurological assessment, discussion with family, other specialists and medical decision making of high complexity. I spent 45 minutes of  neurocritical care time  in the care of  this patient. This was time spent independent of any time provided by nurse practitioner or PA.  , MD [facility] [phone]  If 7pm- 7am, please page neurology on call as listed in AMION. 05/07/2022  9:55 PM

## 2022-05-08 ENCOUNTER — Inpatient Hospital Stay (HOSPITAL_COMMUNITY): Payer: Medicare Other

## 2022-05-08 DIAGNOSIS — G9341 Metabolic encephalopathy: Secondary | ICD-10-CM

## 2022-05-08 DIAGNOSIS — E1101 Type 2 diabetes mellitus with hyperosmolarity with coma: Secondary | ICD-10-CM

## 2022-05-08 DIAGNOSIS — R569 Unspecified convulsions: Secondary | ICD-10-CM

## 2022-05-08 DIAGNOSIS — J690 Pneumonitis due to inhalation of food and vomit: Secondary | ICD-10-CM

## 2022-05-08 DIAGNOSIS — J9601 Acute respiratory failure with hypoxia: Secondary | ICD-10-CM

## 2022-05-08 DIAGNOSIS — G9340 Encephalopathy, unspecified: Secondary | ICD-10-CM | POA: Diagnosis not present

## 2022-05-08 DIAGNOSIS — L8990 Pressure ulcer of unspecified site, unspecified stage: Secondary | ICD-10-CM | POA: Insufficient documentation

## 2022-05-08 HISTORY — DX: Pneumonitis due to inhalation of food and vomit: J69.0

## 2022-05-08 HISTORY — DX: Metabolic encephalopathy: G93.41

## 2022-05-08 LAB — BLOOD GAS, VENOUS
Acid-Base Excess: 1.2 mmol/L (ref 0.0–2.0)
Bicarbonate: 26.6 mmol/L (ref 20.0–28.0)
O2 Saturation: 88.9 %
Patient temperature: 36.4
pCO2, Ven: 43 mmHg — ABNORMAL LOW (ref 44–60)
pH, Ven: 7.4 (ref 7.25–7.43)
pO2, Ven: 57 mmHg — ABNORMAL HIGH (ref 32–45)

## 2022-05-08 LAB — RAPID URINE DRUG SCREEN, HOSP PERFORMED
Amphetamines: NOT DETECTED
Barbiturates: NOT DETECTED
Benzodiazepines: POSITIVE — AB
Cocaine: NOT DETECTED
Opiates: NOT DETECTED
Tetrahydrocannabinol: NOT DETECTED

## 2022-05-08 LAB — URINALYSIS, ROUTINE W REFLEX MICROSCOPIC
Bilirubin Urine: NEGATIVE
Glucose, UA: >=500 mg/dL — AB
Ketones, ur: NEGATIVE mg/dL
Leukocytes,Ua: NEGATIVE
Nitrite: NEGATIVE
Protein, ur: 100 mg/dL — AB
Specific Gravity, Urine: 1.021 (ref 1.005–1.030)
pH: 6 (ref 5.0–8.0)

## 2022-05-08 LAB — BASIC METABOLIC PANEL
Anion gap: 13 (ref 5–15)
Anion gap: 8 (ref 5–15)
BUN: 29 mg/dL — ABNORMAL HIGH (ref 8–23)
BUN: 29 mg/dL — ABNORMAL HIGH (ref 8–23)
CO2: 22 mmol/L (ref 22–32)
CO2: 23 mmol/L (ref 22–32)
Calcium: 8.5 mg/dL — ABNORMAL LOW (ref 8.9–10.3)
Calcium: 8.1 mg/dL — ABNORMAL LOW (ref 8.9–10.3)
Chloride: 103 mmol/L (ref 98–111)
Chloride: 109 mmol/L (ref 98–111)
Creatinine, Ser: 2.17 mg/dL — ABNORMAL HIGH (ref 0.61–1.24)
Creatinine, Ser: 2.06 mg/dL — ABNORMAL HIGH (ref 0.61–1.24)
GFR, Estimated: 33 mL/min — ABNORMAL LOW (ref >60)
GFR, Estimated: 35 mL/min — ABNORMAL LOW (ref >60)
Glucose, Bld: 562 mg/dL (ref 70–99)
Glucose, Bld: 315 mg/dL — ABNORMAL HIGH (ref 70–99)
Potassium: 3.1 mmol/L — ABNORMAL LOW (ref 3.5–5.1)
Potassium: 3.2 mmol/L — ABNORMAL LOW (ref 3.5–5.1)
Sodium: 138 mmol/L (ref 135–145)
Sodium: 140 mmol/L (ref 135–145)

## 2022-05-08 LAB — LACTIC ACID, PLASMA
Lactic Acid, Venous: 3.6 mmol/L (ref 0.5–1.9)
Lactic Acid, Venous: 3 mmol/L (ref 0.5–1.9)

## 2022-05-08 LAB — CBC
HCT: 30.1 % — ABNORMAL LOW (ref 39.0–52.0)
HCT: 29.3 % — ABNORMAL LOW (ref 39.0–52.0)
Hemoglobin: 9.8 g/dL — ABNORMAL LOW (ref 13.0–17.0)
Hemoglobin: 9.7 g/dL — ABNORMAL LOW (ref 13.0–17.0)
MCH: 29.2 pg (ref 26.0–34.0)
MCH: 29 pg (ref 26.0–34.0)
MCHC: 32.6 g/dL (ref 30.0–36.0)
MCHC: 33.1 g/dL (ref 30.0–36.0)
MCV: 89.6 fL (ref 80.0–100.0)
MCV: 87.5 fL (ref 80.0–100.0)
Platelets: 242 10*3/uL (ref 150–400)
Platelets: 212 10*3/uL (ref 150–400)
RBC: 3.36 MIL/uL — ABNORMAL LOW (ref 4.22–5.81)
RBC: 3.35 MIL/uL — ABNORMAL LOW (ref 4.22–5.81)
RDW: 14.1 % (ref 11.5–15.5)
RDW: 14.3 % (ref 11.5–15.5)
WBC: 10.4 10*3/uL (ref 4.0–10.5)
WBC: 10.5 10*3/uL (ref 4.0–10.5)
nRBC: 0 % (ref 0.0–0.2)
nRBC: 0 % (ref 0.0–0.2)

## 2022-05-08 LAB — GLUCOSE, CAPILLARY
Glucose-Capillary: 101 mg/dL — ABNORMAL HIGH (ref 70–99)
Glucose-Capillary: 147 mg/dL — ABNORMAL HIGH (ref 70–99)
Glucose-Capillary: 173 mg/dL — ABNORMAL HIGH (ref 70–99)
Glucose-Capillary: 211 mg/dL — ABNORMAL HIGH (ref 70–99)
Glucose-Capillary: 225 mg/dL — ABNORMAL HIGH (ref 70–99)
Glucose-Capillary: 261 mg/dL — ABNORMAL HIGH (ref 70–99)
Glucose-Capillary: 318 mg/dL — ABNORMAL HIGH (ref 70–99)
Glucose-Capillary: 323 mg/dL — ABNORMAL HIGH (ref 70–99)
Glucose-Capillary: 381 mg/dL — ABNORMAL HIGH (ref 70–99)
Glucose-Capillary: 426 mg/dL — ABNORMAL HIGH (ref 70–99)
Glucose-Capillary: 436 mg/dL — ABNORMAL HIGH (ref 70–99)
Glucose-Capillary: 467 mg/dL — ABNORMAL HIGH (ref 70–99)
Glucose-Capillary: 532 mg/dL (ref 70–99)
Glucose-Capillary: 534 mg/dL (ref 70–99)
Glucose-Capillary: 66 mg/dL — ABNORMAL LOW (ref 70–99)
Glucose-Capillary: 73 mg/dL (ref 70–99)
Glucose-Capillary: 82 mg/dL (ref 70–99)
Glucose-Capillary: 83 mg/dL (ref 70–99)

## 2022-05-08 LAB — MRSA NEXT GEN BY PCR, NASAL: MRSA by PCR Next Gen: DETECTED — AB

## 2022-05-08 LAB — MAGNESIUM: Magnesium: 2 mg/dL (ref 1.7–2.4)

## 2022-05-08 LAB — OSMOLALITY: Osmolality: 322 mOsm/kg (ref 275–295)

## 2022-05-08 LAB — PHOSPHORUS: Phosphorus: 2.7 mg/dL (ref 2.5–4.6)

## 2022-05-08 LAB — TRIGLYCERIDES: Triglycerides: 61 mg/dL (ref <150)

## 2022-05-08 MED ORDER — VITAL 1.5 CAL PO LIQD
1000.0000 mL | ORAL | Status: DC
  Administered 2022-05-08: 1000 mL

## 2022-05-08 MED ORDER — PROSOURCE TF PO LIQD
45.0000 mL | Freq: Three times a day (TID) | ORAL | Status: DC
  Administered 2022-05-08 (×2): 45 mL
  Filled 2022-05-08 (×3): qty 45

## 2022-05-08 MED ORDER — ADULT MULTIVITAMIN W/MINERALS CH
1.0000 | ORAL_TABLET | Freq: Every day | ORAL | Status: DC
  Administered 2022-05-08 – 2022-05-09 (×2): 1
  Filled 2022-05-08 (×2): qty 1

## 2022-05-08 MED ORDER — CHLORHEXIDINE GLUCONATE CLOTH 2 % EX PADS: 6.0000 | MEDICATED_PAD | Freq: Every day | CUTANEOUS | Status: DC

## 2022-05-08 MED ORDER — INSULIN ASPART 100 UNIT/ML IJ SOLN
0.0000 [IU] | INTRAMUSCULAR | Status: DC
  Administered 2022-05-09: 3 [IU] via SUBCUTANEOUS
  Administered 2022-05-09 (×3): 4 [IU] via SUBCUTANEOUS
  Administered 2022-05-09: 3 [IU] via SUBCUTANEOUS

## 2022-05-08 MED ORDER — CHLORHEXIDINE GLUCONATE CLOTH 2 % EX PADS
6.0000 | MEDICATED_PAD | Freq: Every day | CUTANEOUS | Status: DC
  Administered 2022-05-09 – 2022-05-11 (×4): 6 via TOPICAL

## 2022-05-08 MED ORDER — DEXTROSE 50 % IV SOLN
12.5000 g | INTRAVENOUS | Status: AC
  Administered 2022-05-08: 12.5 g via INTRAVENOUS
  Filled 2022-05-08: qty 50

## 2022-05-08 MED ORDER — LOSARTAN POTASSIUM 50 MG PO TABS
50.0000 mg | ORAL_TABLET | Freq: Every day | ORAL | Status: DC
  Administered 2022-05-08: 50 mg
  Filled 2022-05-08 (×2): qty 1

## 2022-05-08 MED ORDER — FAMOTIDINE 20 MG PO TABS
20.0000 mg | ORAL_TABLET | Freq: Every day | ORAL | Status: DC
  Administered 2022-05-08: 20 mg
  Filled 2022-05-08: qty 1

## 2022-05-08 MED ORDER — POTASSIUM CHLORIDE 20 MEQ PO PACK
40.0000 meq | PACK | Freq: Once | ORAL | Status: AC
  Administered 2022-05-08: 40 meq
  Filled 2022-05-08: qty 2

## 2022-05-08 MED ORDER — INSULIN GLARGINE-YFGN 100 UNIT/ML ~~LOC~~ SOLN
10.0000 [IU] | Freq: Two times a day (BID) | SUBCUTANEOUS | Status: DC
  Administered 2022-05-08: 10 [IU] via SUBCUTANEOUS
  Filled 2022-05-08 (×5): qty 0.1

## 2022-05-08 MED ORDER — CHLORHEXIDINE GLUCONATE CLOTH 2 % EX PADS
6.0000 | MEDICATED_PAD | Freq: Every day | CUTANEOUS | Status: DC
  Administered 2022-05-08: 6 via TOPICAL

## 2022-05-08 MED ORDER — TORSEMIDE 20 MG PO TABS
20.0000 mg | ORAL_TABLET | Freq: Every day | ORAL | Status: DC
  Administered 2022-05-08: 20 mg
  Filled 2022-05-08 (×2): qty 1

## 2022-05-08 MED ORDER — LEVETIRACETAM IN NACL 500 MG/100ML IV SOLN
500.0000 mg | Freq: Two times a day (BID) | INTRAVENOUS | Status: DC
  Administered 2022-05-08 – 2022-05-09 (×3): 500 mg via INTRAVENOUS
  Filled 2022-05-08 (×3): qty 100

## 2022-05-08 MED ORDER — VITAL HIGH PROTEIN PO LIQD: 1000.0000 mL | ORAL | Status: DC

## 2022-05-08 MED ORDER — MUPIROCIN 2 % EX OINT
1.0000 | TOPICAL_OINTMENT | Freq: Two times a day (BID) | CUTANEOUS | Status: DC
  Administered 2022-05-08 (×2): 1 via NASAL

## 2022-05-08 MED ORDER — MUPIROCIN 2 % EX OINT
1.0000 | TOPICAL_OINTMENT | Freq: Two times a day (BID) | CUTANEOUS | Status: DC
  Administered 2022-05-08 – 2022-05-11 (×7): 1 via NASAL
  Filled 2022-05-08: qty 22

## 2022-05-08 NOTE — Progress Notes (Signed)
Rt and RN transported vent patient to CT and back. Vital signs stable through out.

## 2022-05-08 NOTE — Progress Notes (Signed)
Subjective: No seizures overnight.  Patient intubated and unable to provide any more history.  No family at bedside  ROS: Unable to obtain due to intubation  Examination  Vital signs in last 24 hours: Temp:  [97.6 F (36.4 C)-98.1 F (36.7 C)] 97.6 F (36.4 C) (07/10 0800) Pulse Rate:  [59-97] 59 (07/10 0800) Resp:  [16-25] 20 (07/10 0800) BP: (93-266)/(63-141) 126/78 (07/10 0800) SpO2:  [97 %-100 %] 100 % (07/10 0800) FiO2 (%):  [40 %-100 %] 40 % (07/10 0754) Weight:  [61.2 kg] 61.2 kg (07/09 1934)  General: lying in bed, NAD  Neuro: Opens eyes to repeated tactile stimuli, looks at examiner, tends to follow simple one-step commands but not consistently, PERRLA, EOMI, antigravity strength in all 4 extremities  Basic Metabolic Panel: Recent Labs  Lab 05/07/22 1948 05/07/22 1958 05/07/22 2104 05/08/22 0022 05/08/22 0457  NA 135 135 136 138 140  K 3.4* 3.5 2.8* 3.1* 3.2*  CL 98 98  --  103 109  CO2 22  --   --  22 23  GLUCOSE 834* >700*  --  562* 315*  BUN 29* 30*  --  29* 29*  CREATININE 2.17* 2.10*  --  2.17* 2.06*  CALCIUM 8.8*  --   --  8.5* 8.1*  MG  --   --   --  2.0  --   PHOS  --   --   --   --  2.7    CBC: Recent Labs  Lab 05/07/22 1948 05/07/22 1958 05/07/22 2104 05/08/22 0022 05/08/22 0457  WBC 9.1  --   --  10.4 10.5  NEUTROABS 7.5  --   --   --   --   HGB 11.9* 12.9* 11.9* 9.8* 9.7*  HCT 36.9* 38.0* 35.0* 30.1* 29.3*  MCV 90.7  --   --  89.6 87.5  PLT 296  --   --  242 212     Coagulation Studies: Recent Labs    05/07/22 1948  LABPROT 12.5  INR 0.9    Imaging CT head without contrast 05/07/2022: Atrophy, chronic microvascular disease. No acute intracranial abnormality.   ASSESSMENT AND PLAN: 57 old male presented with new onset seizures in the setting hyperglycemic hyperosmolar nonketotic coma.  New onset seizures Hyperglycemic hyperosmolar nonketotic coma Acute metabolic encephalopathy, improving Lactic acidosis, improving Microcytic  anemia Hypokalemia CKD -Etiology of seizures: Suspected provoked in the setting of hyperglycemia -Acute encephalopathy likely due to hyperglycemia and seizures  Recommendations -Agree with stopping propofol as no seizures overnight -Continue Keppra 500 mg twice daily for now.  This could potentially be weaned off in future if patient remains seizure-free -We will obtain MRI brain without contrast to look for any acute abnormality -Continue seizure precautions -Management of rest of comorbidities per primary team -Discussed plan with critical care team  I have spent a total of  37 minutes with the patient reviewing hospital notes,  test results, labs and examining the patient as well as establishing an assessment and plan.  > 50% of time was spent in direct patient care.    Lindie Spruce Epilepsy [facility] For questions after 5pm please refer to AMION to reach the Neurologist on call

## 2022-05-08 NOTE — Progress Notes (Signed)
CBG 66  Pt exam unchanged, VS stable. Hypoglycemia protocol followed; Pt received 12.5 g Dextrose 50% IV.    Following CBG 73

## 2022-05-08 NOTE — Assessment & Plan Note (Signed)
Start tube feeding

## 2022-05-08 NOTE — Assessment & Plan Note (Signed)
Continue ceftriaxone > plan 5 days

## 2022-05-08 NOTE — Progress Notes (Signed)
SBT attempted with patient this am. No effort made by patient at this time despite stimulation. RT placed pt back to full support settings. RT will continue to monitor and be available as needed.

## 2022-05-08 NOTE — Assessment & Plan Note (Signed)
Now at baseline Monitor BMET and UOP Replace electrolytes as needed

## 2022-05-08 NOTE — Progress Notes (Signed)
Pt transported from 4N21 to MRI1 and back on the ventilator without complication. RT and RN x2 accompanied patient.

## 2022-05-08 NOTE — Assessment & Plan Note (Signed)
Monitor for bleeding Transfuse PRBC for Hgb < 7 gm/dL

## 2022-05-08 NOTE — Assessment & Plan Note (Signed)
Due to metabolic encephlopathy Full mechanical vent support VAP prevention Daily WUA/SBT

## 2022-05-08 NOTE — Progress Notes (Signed)
Initial Nutrition Assessment  DOCUMENTATION CODES:   Not applicable  INTERVENTION:   Tube Feeding via OG:  Vital 1.5 at 50 ml/hr Begin TF at 20 ml/hr, titrate by 10 mL q 8 hours until goal rate of 50 ml/hr Pro-Source TF 45 mL TID  Change liquid MVI with standard MVI with Minerals  NUTRITION DIAGNOSIS:   Inadequate oral intake related to acute illness as evidenced by NPO status.  GOAL:   Patient will meet greater than or equal to 90% of their needs  MONITOR:   Vent status, Labs, Weight trends, TF tolerance  REASON FOR ASSESSMENT:   Ventilator, Consult Enteral/tube feeding initiation and management  ASSESSMENT:   66 yo male admitted with new onset seizures, hyperglycemic hyperosmolar  nonketotic coma, AMS requiring intubation. PMH includes DM, HTN, KD 3, HLD, CHF  7/09 Admitted, Intubated 7/10 CT head-chronic atrophy/microvascular disease  Pt remains on vent support, off sedation on visit Noted MRI pending. No seizures overnight  OG tube extends beyond the diaphragm per chest xray report.   Significant edema on exam making nutrition focused physical exam (NFPE) challenging, unable to assess muscle and subcutaneous fat in several areas. Plan to attempt repeat NFPE once diuresed as able. Suspect pt with some degree of malnutrition based on limited assessment  Pt with height of 5'2" documented; pt appears to be taller. Noted 5'6" as height from admission in Jan. RN present and able to measure pt. Pt height is actually 5'6". RD to change height in system  Pt undergoing treatment for C.diff infection upon admission, no BM yet this admission  Pt on insulin drip initially but transitioned to semglee bid and ss insulin q 4 hours. Pt may require increased coverage with initiation of TF  Labs: potassium 3.2 (L), Creatinine 2.06, CBGs 82-467 Meds: cholecalciferol, ss novolog, semglee, niacin 100 mg BID, liquid MVI, KCl, torsemide, sodium bicarb  NUTRITION - FOCUSED PHYSICAL  EXAM:  Flowsheet Row Most Recent Value  Orbital Region Mild depletion  Upper Arm Region Unable to assess  [edema]  Thoracic and Lumbar Region Unable to assess  Buccal Region Unable to assess  Temple Region Moderate depletion  Clavicle Bone Region Mild depletion  Clavicle and Acromion Bone Region Mild depletion  Dorsal Hand Unable to assess  Patellar Region Unable to assess  [edema]  Anterior Thigh Region Unable to assess  Posterior Calf Region Unable to assess  [edema]  Edema (RD Assessment) Severe       Diet Order:   Diet Order             Diet NPO time specified  Diet effective now                   EDUCATION NEEDS:   Not appropriate for education at this time  Skin:  Skin Assessment: Skin Integrity Issues: Skin Integrity Issues:: Stage II, Other (Comment) Stage II: coccyx, thigh Other: MASD  Last BM:  PTA  Height:   Ht Readings from Last 1 Encounters:  05/08/22 5\' 6"  (1.676 m)    Weight:   Wt Readings from Last 1 Encounters:  05/07/22 61.2 kg     BMI:  Body mass index is 21.78 kg/m.  Estimated Nutritional Needs:   Kcal:  1900-2100 kcals  Protein:  100-115 g  Fluid:  >/= 1.9 L  07/08/22 MS, RDN, LDN, CNSC Registered Dietitian 3 Clinical Nutrition RD Pager and On-Call Pager Number Located in [place]

## 2022-05-08 NOTE — Assessment & Plan Note (Signed)
Statin to continue

## 2022-05-08 NOTE — Assessment & Plan Note (Signed)
resolved 

## 2022-05-08 NOTE — Progress Notes (Signed)
Multiple attempts made with pt and performing SBT. Pt with either low VT/RR or apneas/no effort. PT remains on full support. SBT will be trialed again tomorrow 7/11. RT will continue to monitor and be available as needed.

## 2022-05-08 NOTE — Progress Notes (Signed)
eLink Physician-Brief Progress Note Patient Name: Manuel Kramer DOB: [birth year] MRN: [id number]   Date of Service  05/08/2022  HPI/Events of Note  Notified of hyperglycemia with glucose at 532.  Pt has tube feeds ordered.   eICU Interventions  Continue insulin gtt.  Ok to hold starting tube feeds until the morning and hopefully with better sugar control by then.     Intervention Category Intermediate Interventions: Hyperglycemia - evaluation and treatment  Larinda Buttery 05/08/2022, 12:17 AM

## 2022-05-08 NOTE — Assessment & Plan Note (Deleted)
Unclear why he was so hyperglycemic, now improved with insulin infusion Stop dextrose infusion Stop insulin infusion  Start glarine 10 bid Start SSI, resistant scale Monitor mental status, electrolytes

## 2022-05-08 NOTE — Procedures (Signed)
Patient Name: Manuel Kramer  MRN: [id number]  Epilepsy Attending: Charlsie Quest  Referring Physician/Provider: Dr Ritta Slot Duration: 05/07/2022 2034 to 05/08/2022 1709  Patient history: 66 year old male with new onset seizures in the setting of severe hyperglycemia. EEG to evaluate for seizure  Level of alertness: lethargic   AEDs during EEG study: Propofol, GBP  Technical aspects: This EEG was obtained using a 10 lead EEG system positioned circumferentially without any parasagittal coverage (rapid EEG). Computer selected EEG is reviewed as  well as background features and all clinically significant events.  Description: EEG showed continuous generalized 3 to 6 Hz theta-delta slowing. Hyperventilation and photic stimulation were not performed.     ABNORMALITY - Continuous slow, generalized  IMPRESSION: This limited ceribell EEG is suggestive of moderate to severe diffuse encephalopathy, nonspecific etiology. No seizures or epileptiform discharges were seen throughout the recording.  If suspicion for interictal activity remains a concern, a conventional eeg can be considered.   Marylin Lathon Annabelle Harman

## 2022-05-08 NOTE — Progress Notes (Signed)
NAME:  Manuel Kramer, MRN:  [id number], DOB:  11-03-1955, LOS: 1 ADMISSION DATE:  05/07/2022, CONSULTATION DATE:  7/9 REFERRING MD:  Jamal Maes REASON FOR CONSULT:  Acute encephalopathy   History of Present Illness:  66 y/o male admitted on 7/10 after he was found unresponsive in his nursing home with concern for seizure activity.  Noted to be profoundly hyperglycemic on admission.    Pertinent  Medical History  Recent C. difficile infection Diabetes Hypertension CKD 3B Hyperlipidemia HFpEF Deconditioning  Significant Hospital Events: Including procedures, antibiotic start and stop dates in addition to other pertinent events   7/9 admission; intubated for airway protection  Tubes/lines: 7/10 ETT>   Studies: 7/9 CT head> chronic atrophy/microvascular disease, NAICP 7/10 CT chest > cardiomegally and aortic tortuosity, dependent atelectasis, possible RLL consolidation  Antibiotics: 7/9 vanc > 7/10 7/10 rocephin >   Interim History / Subjective:  A bit more awake and responsive this morning  Objective   Blood pressure 126/78, pulse (!) 59, temperature 97.6 F (36.4 C), temperature source Axillary, resp. rate 20, height 5\' 2"  (1.575 m), weight 61.2 kg, SpO2 100 %.    Vent Mode: PRVC FiO2 (%):  [40 %-100 %] 40 % Set Rate:  [20 bmp] 20 bmp Vt Set:  [430 mL] 430 mL PEEP:  [5 cmH20] 5 cmH20 Plateau Pressure:  [18 cmH20-19 cmH20] 18 cmH20   Intake/Output Summary (Last 24 hours) at 05/08/2022 0843 Last data filed at 05/08/2022 0800 Gross per 24 hour  Intake 2719.96 ml  Output 935 ml  Net 1784.96 ml   Filed Weights   05/07/22 1934  Weight: 61.2 kg    Examination:  General:  In bed on vent HENT: NCAT ETT in place PULM: CTA B, vent supported breathing CV: RRR, no mgr GI: BS+, soft, nontender MSK: normal bulk and tone Neuro: sedated on vent   Resolved Hospital Problem list     Assessment & Plan:  Hyperglycemic hyperosmolar nonketotic coma (HCC) Unclear why he  was so hyperglycemic, now improved with insulin infusion Stop dextrose infusion Stop insulin infusion  Start glarine 10 bid Start SSI, resistant scale Monitor mental status, electrolytes  Acute metabolic encephalopathy Due to HHS> treat as above Continue PAD protocol, prn fentanyl alone Stop propofol Frequent orientation Consider MRI brain today if not more awake, PRES?  Acute renal failure superimposed on stage 3b chronic kidney disease (HCC) Improved, now at baseline Monitor BMET and UOP Replace electrolytes as needed Stop D5 LR  Acute respiratory failure with hypoxemia (HCC) Due to metabolic encephlopathy Full mechanical vent support VAP prevention Daily WUA/SBT   Anemia of chronic disease Monitor for bleeding Transfuse PRBC for Hgb < 7 gm/dL   C. difficile colitis Continue oral vanc> extend another 14 days given IV antibiotics Stop PPI  Hypertensive emergency Monitor off cleviprex Goal SBP < 180 Continue:  Coreg Amlodipine Clonidine hydralazine  Chronic diastolic CHF (congestive heart failure) (HCC) Antihypertensives as above Isordil Restart losartan Restart torsemide today, give extra KCl this afternoon  Hyperlipidemia Statin to continue  Protein-calorie malnutrition, severe Start tube feeding  Hypokalemia Replete today  Aspiration pneumonia (HCC) Causing sepsis Continue ceftriaxone Stop vanc    Best Practice (right click and "Reselect all SmartList Selections" daily)   Diet/type: tubefeeds DVT prophylaxis: systemic heparin GI prophylaxis: H2B Lines: N/A Foley:  Yes, and it is still needed Code Status:  full code Last date of multidisciplinary goals of care discussion [I spoke to his son 07/08/22 by phone today and gave him  an update.  At this time remains full code.]     Critical care time: 45 minutes    Heber Socorro, MD [place] [facility] Pager: [phone] Cell: [phone] After 7:00 pm call Elink  [phone]

## 2022-05-08 NOTE — Assessment & Plan Note (Addendum)
Antihypertensives as above Isordil Restart losartan Restart torsemide today, give extra KCl this afternoon

## 2022-05-08 NOTE — Assessment & Plan Note (Addendum)
Very difficult to control Goal SBP < 180 Continue:  Coreg Amlodipine Clonidine hydralazine Continue losartan to 100mg  daily Continue spironolactone May need to consider minoxidil

## 2022-05-08 NOTE — Assessment & Plan Note (Deleted)
Due to HHS> treat as above Continue PAD protocol, prn fentanyl alone Stop propofol Frequent orientation

## 2022-05-08 NOTE — Progress Notes (Signed)
Date and time results received: 05/08/22 0128  Critical Value: Serum osmolality 322  Name of Provider Notified: E-link

## 2022-05-08 NOTE — Assessment & Plan Note (Addendum)
Due to HHS> treat as above Continue PAD protocol, prn fentanyl alone Stop propofol Frequent orientation Consider MRI brain today if not more awake, PRES?

## 2022-05-08 NOTE — Progress Notes (Signed)
eLink Physician-Brief Progress Note Patient Name: Manuel Kramer DOB: 06/18/1956 MRN: [id number]   Date of Service  05/08/2022  HPI/Events of Note  K 3.1, lactate 3.6, crea 2.17.  Serum osmolality is high at 322.   eICU Interventions  Replete K - KCL via tube ordered.  Continue to trend lactate.      Intervention Category Intermediate Interventions: Diagnostic test evaluation  Larinda Buttery 05/08/2022, 1:50 AM

## 2022-05-08 NOTE — Assessment & Plan Note (Addendum)
Continue oral vanc> extend another 14 days given IV antibiotics Stop PPI

## 2022-05-09 DIAGNOSIS — G40909 Epilepsy, unspecified, not intractable, without status epilepticus: Secondary | ICD-10-CM

## 2022-05-09 DIAGNOSIS — E1101 Type 2 diabetes mellitus with hyperosmolarity with coma: Secondary | ICD-10-CM | POA: Diagnosis not present

## 2022-05-09 DIAGNOSIS — I639 Cerebral infarction, unspecified: Secondary | ICD-10-CM | POA: Diagnosis not present

## 2022-05-09 DIAGNOSIS — G9340 Encephalopathy, unspecified: Secondary | ICD-10-CM | POA: Diagnosis not present

## 2022-05-09 LAB — URINE CULTURE: Culture: NO GROWTH

## 2022-05-09 LAB — COMPREHENSIVE METABOLIC PANEL
ALT: 28 U/L (ref 0–44)
AST: 20 U/L (ref 15–41)
Albumin: 2 g/dL — ABNORMAL LOW (ref 3.5–5.0)
Alkaline Phosphatase: 63 U/L (ref 38–126)
Anion gap: 5 (ref 5–15)
BUN: 33 mg/dL — ABNORMAL HIGH (ref 8–23)
CO2: 23 mmol/L (ref 22–32)
Calcium: 8.1 mg/dL — ABNORMAL LOW (ref 8.9–10.3)
Chloride: 113 mmol/L — ABNORMAL HIGH (ref 98–111)
Creatinine, Ser: 2.19 mg/dL — ABNORMAL HIGH (ref 0.61–1.24)
GFR, Estimated: 33 mL/min — ABNORMAL LOW (ref >60)
Glucose, Bld: 177 mg/dL — ABNORMAL HIGH (ref 70–99)
Potassium: 4.5 mmol/L (ref 3.5–5.1)
Sodium: 141 mmol/L (ref 135–145)
Total Bilirubin: 0.3 mg/dL (ref 0.3–1.2)
Total Protein: 4.8 g/dL — ABNORMAL LOW (ref 6.5–8.1)

## 2022-05-09 LAB — CBC
HCT: 29.8 % — ABNORMAL LOW (ref 39.0–52.0)
Hemoglobin: 9.4 g/dL — ABNORMAL LOW (ref 13.0–17.0)
MCH: 29.3 pg (ref 26.0–34.0)
MCHC: 31.5 g/dL (ref 30.0–36.0)
MCV: 92.8 fL (ref 80.0–100.0)
Platelets: 205 10*3/uL (ref 150–400)
RBC: 3.21 MIL/uL — ABNORMAL LOW (ref 4.22–5.81)
RDW: 15.1 % (ref 11.5–15.5)
WBC: 10.4 10*3/uL (ref 4.0–10.5)
nRBC: 0 % (ref 0.0–0.2)

## 2022-05-09 LAB — GLUCOSE, CAPILLARY
Glucose-Capillary: 177 mg/dL — ABNORMAL HIGH (ref 70–99)
Glucose-Capillary: 162 mg/dL — ABNORMAL HIGH (ref 70–99)
Glucose-Capillary: 161 mg/dL — ABNORMAL HIGH (ref 70–99)
Glucose-Capillary: 126 mg/dL — ABNORMAL HIGH (ref 70–99)
Glucose-Capillary: 66 mg/dL — ABNORMAL LOW (ref 70–99)
Glucose-Capillary: 87 mg/dL (ref 70–99)
Glucose-Capillary: 144 mg/dL — ABNORMAL HIGH (ref 70–99)

## 2022-05-09 LAB — PHOSPHORUS: Phosphorus: 3.5 mg/dL (ref 2.5–4.6)

## 2022-05-09 LAB — MAGNESIUM: Magnesium: 2 mg/dL (ref 1.7–2.4)

## 2022-05-09 LAB — BRAIN NATRIURETIC PEPTIDE: B Natriuretic Peptide: 547.5 pg/mL — ABNORMAL HIGH (ref 0.0–100.0)

## 2022-05-09 MED ORDER — INSULIN GLARGINE-YFGN 100 UNIT/ML ~~LOC~~ SOLN
10.0000 [IU] | Freq: Every day | SUBCUTANEOUS | Status: DC
  Filled 2022-05-09 (×2): qty 0.1

## 2022-05-09 MED ORDER — SPIRONOLACTONE 25 MG PO TABS
25.0000 mg | ORAL_TABLET | Freq: Every day | ORAL | Status: DC
Start: 2022-05-10 — End: 2022-05-12
  Administered 2022-05-10 – 2022-05-12 (×3): 25 mg via ORAL
  Filled 2022-05-09 (×3): qty 1

## 2022-05-09 MED ORDER — VITAMIN D 25 MCG (1000 UNIT) PO TABS
1000.0000 [IU] | ORAL_TABLET | Freq: Every day | ORAL | Status: DC
  Administered 2022-05-10 – 2022-05-12 (×3): 1000 [IU] via ORAL
  Filled 2022-05-09 (×3): qty 1

## 2022-05-09 MED ORDER — ADULT MULTIVITAMIN W/MINERALS CH
1.0000 | ORAL_TABLET | Freq: Every day | ORAL | Status: DC
  Administered 2022-05-10 – 2022-05-12 (×3): 1 via ORAL
  Filled 2022-05-09 (×3): qty 1

## 2022-05-09 MED ORDER — CARVEDILOL 12.5 MG PO TABS
25.0000 mg | ORAL_TABLET | Freq: Two times a day (BID) | ORAL | Status: DC
  Administered 2022-05-09 – 2022-05-12 (×6): 25 mg via ORAL
  Filled 2022-05-09 (×6): qty 2

## 2022-05-09 MED ORDER — ATORVASTATIN CALCIUM 40 MG PO TABS
40.0000 mg | ORAL_TABLET | Freq: Every day | ORAL | Status: DC
Start: 2022-05-10 — End: 2022-05-12
  Administered 2022-05-10 – 2022-05-12 (×3): 40 mg via ORAL
  Filled 2022-05-09 (×3): qty 1

## 2022-05-09 MED ORDER — LOSARTAN POTASSIUM 50 MG PO TABS
100.0000 mg | ORAL_TABLET | Freq: Every day | ORAL | Status: DC
  Administered 2022-05-10 – 2022-05-12 (×3): 100 mg via ORAL
  Filled 2022-05-09 (×3): qty 2

## 2022-05-09 MED ORDER — POLYETHYLENE GLYCOL 3350 17 G PO PACK: 17.0000 g | PACK | Freq: Every day | ORAL | Status: DC | PRN

## 2022-05-09 MED ORDER — DOCUSATE SODIUM 100 MG PO CAPS
100.0000 mg | ORAL_CAPSULE | Freq: Two times a day (BID) | ORAL | Status: DC | PRN
Start: 2022-05-09 — End: 2022-05-12

## 2022-05-09 MED ORDER — HYDRALAZINE HCL 50 MG PO TABS
100.0000 mg | ORAL_TABLET | Freq: Three times a day (TID) | ORAL | Status: DC
Start: 2022-05-09 — End: 2022-05-12
  Administered 2022-05-09 – 2022-05-12 (×8): 100 mg via ORAL
  Filled 2022-05-09 (×8): qty 2

## 2022-05-09 MED ORDER — CLONIDINE HCL 0.1 MG PO TABS
0.3000 mg | ORAL_TABLET | Freq: Three times a day (TID) | ORAL | Status: DC
Start: 2022-05-09 — End: 2022-05-12
  Administered 2022-05-09 – 2022-05-12 (×9): 0.3 mg via ORAL
  Filled 2022-05-09 (×2): qty 1
  Filled 2022-05-09: qty 3
  Filled 2022-05-09: qty 1
  Filled 2022-05-09 (×2): qty 3
  Filled 2022-05-09 (×3): qty 1

## 2022-05-09 MED ORDER — VANCOMYCIN 50 MG/ML ORAL SOLUTION
125.0000 mg | Freq: Four times a day (QID) | ORAL | Status: DC
  Administered 2022-05-09 – 2022-05-12 (×8): 125 mg via ORAL
  Filled 2022-05-09 (×11): qty 2.5

## 2022-05-09 MED ORDER — GABAPENTIN 100 MG PO CAPS
100.0000 mg | ORAL_CAPSULE | Freq: Every day | ORAL | Status: DC
Start: 2022-05-09 — End: 2022-05-12
  Administered 2022-05-09 – 2022-05-11 (×3): 100 mg via ORAL
  Filled 2022-05-09 (×3): qty 1

## 2022-05-09 MED ORDER — CLOPIDOGREL BISULFATE 75 MG PO TABS
75.0000 mg | ORAL_TABLET | Freq: Every day | ORAL | Status: DC
  Administered 2022-05-10 – 2022-05-12 (×3): 75 mg via ORAL
  Filled 2022-05-09 (×3): qty 1

## 2022-05-09 MED ORDER — TORSEMIDE 20 MG PO TABS
40.0000 mg | ORAL_TABLET | Freq: Every day | ORAL | Status: DC
  Administered 2022-05-09: 20 mg
  Filled 2022-05-09: qty 2

## 2022-05-09 MED ORDER — SERTRALINE HCL 50 MG PO TABS
50.0000 mg | ORAL_TABLET | Freq: Every day | ORAL | Status: DC
Start: 2022-05-10 — End: 2022-05-12
  Administered 2022-05-10 – 2022-05-12 (×3): 50 mg via ORAL
  Filled 2022-05-09 (×3): qty 1

## 2022-05-09 MED ORDER — ISOSORBIDE DINITRATE 20 MG PO TABS
10.0000 mg | ORAL_TABLET | Freq: Three times a day (TID) | ORAL | Status: DC
  Administered 2022-05-09 – 2022-05-11 (×6): 10 mg via ORAL
  Filled 2022-05-09 (×7): qty 1

## 2022-05-09 MED ORDER — SODIUM BICARBONATE 650 MG PO TABS
1300.0000 mg | ORAL_TABLET | Freq: Two times a day (BID) | ORAL | Status: DC
  Administered 2022-05-09 – 2022-05-12 (×6): 1300 mg via ORAL
  Filled 2022-05-09 (×6): qty 2

## 2022-05-09 MED ORDER — NIACIN 100 MG PO TABS
100.0000 mg | ORAL_TABLET | Freq: Two times a day (BID) | ORAL | Status: DC
Start: 2022-05-09 — End: 2022-05-12
  Administered 2022-05-09 – 2022-05-12 (×6): 100 mg via ORAL
  Filled 2022-05-09 (×8): qty 1

## 2022-05-09 MED ORDER — LOSARTAN POTASSIUM 50 MG PO TABS
100.0000 mg | ORAL_TABLET | Freq: Every day | ORAL | Status: DC
Start: 2022-05-09 — End: 2022-05-09
  Administered 2022-05-09: 100 mg
  Filled 2022-05-09: qty 2

## 2022-05-09 MED ORDER — TORSEMIDE 20 MG PO TABS
40.0000 mg | ORAL_TABLET | Freq: Every day | ORAL | Status: DC
  Administered 2022-05-10 – 2022-05-12 (×3): 40 mg via ORAL
  Filled 2022-05-09 (×3): qty 2

## 2022-05-09 MED ORDER — ZINC OXIDE 12.8 % EX OINT
TOPICAL_OINTMENT | Freq: Three times a day (TID) | CUTANEOUS | Status: DC
Start: 2022-05-09 — End: 2022-05-12
  Filled 2022-05-09 (×2): qty 56.7

## 2022-05-09 MED ORDER — SPIRONOLACTONE 25 MG PO TABS
25.0000 mg | ORAL_TABLET | Freq: Every day | ORAL | Status: DC
  Administered 2022-05-09: 25 mg
  Filled 2022-05-09: qty 1

## 2022-05-09 MED ORDER — AMLODIPINE BESYLATE 10 MG PO TABS
10.0000 mg | ORAL_TABLET | Freq: Every day | ORAL | Status: DC
  Administered 2022-05-10 – 2022-05-12 (×3): 10 mg via ORAL
  Filled 2022-05-09 (×3): qty 1

## 2022-05-09 MED ORDER — FAMOTIDINE 20 MG PO TABS
20.0000 mg | ORAL_TABLET | Freq: Every day | ORAL | Status: DC
  Administered 2022-05-09: 20 mg via ORAL
  Filled 2022-05-09: qty 1

## 2022-05-09 MED ORDER — LEVETIRACETAM 500 MG PO TABS
500.0000 mg | ORAL_TABLET | Freq: Two times a day (BID) | ORAL | Status: DC
  Administered 2022-05-09 – 2022-05-12 (×6): 500 mg via ORAL
  Filled 2022-05-09 (×6): qty 1

## 2022-05-09 MED ORDER — LORATADINE 10 MG PO TABS
10.0000 mg | ORAL_TABLET | Freq: Every day | ORAL | Status: DC
  Administered 2022-05-10 – 2022-05-12 (×3): 10 mg via ORAL
  Filled 2022-05-09 (×3): qty 1

## 2022-05-09 NOTE — Progress Notes (Addendum)
Subjective: Patient was extubated this morning.  Patient states he is not sure why he is in the hospital.  Denies any history of epilepsy/seizures.  ROS: negative except above  Examination  Vital signs in last 24 hours: Temp:  [96.2 F (35.7 C)-97.7 F (36.5 C)] 97.6 F (36.4 C) (07/11 0748) Pulse Rate:  [62-72] 69 (07/11 0900) Resp:  [10-24] 12 (07/11 0900) BP: (114-178)/(63-113) 132/100 (07/11 0900) SpO2:  [100 %] 100 % (07/11 0900) FiO2 (%):  [40 %] 40 % (07/11 0741)  General: lying in bed, NAD Neuro: Awake, alert, oriented to self, place (hospital), not to time.  Perseverates on names and numbers (kept repeating some name and then #6), able to name 1 object, able to follow 1 simple command (wiggle toes) but did not follow other commands, PERRLA, blinks to threat bilaterally, tracks examiner in room, 5/5 strength in all extremities  Basic Metabolic Panel: Recent Labs  Lab 05/07/22 1948 05/07/22 1958 05/07/22 2104 05/08/22 0022 05/08/22 0457 05/09/22 0426  NA 135 135 136 138 140 141  K 3.4* 3.5 2.8* 3.1* 3.2* 4.5  CL 98 98  --  103 109 113*  CO2 22  --   --  22 23 23   GLUCOSE 834* >700*  --  562* 315* 177*  BUN 29* 30*  --  29* 29* 33*  CREATININE 2.17* 2.10*  --  2.17* 2.06* 2.19*  CALCIUM 8.8*  --   --  8.5* 8.1* 8.1*  MG  --   --   --  2.0  --  2.0  PHOS  --   --   --   --  2.7 3.5    CBC: Recent Labs  Lab 05/07/22 1948 05/07/22 1958 05/07/22 2104 05/08/22 0022 05/08/22 0457 05/09/22 0426  WBC 9.1  --   --  10.4 10.5 10.4  NEUTROABS 7.5  --   --   --   --   --   HGB 11.9* 12.9* 11.9* 9.8* 9.7* 9.4*  HCT 36.9* 38.0* 35.0* 30.1* 29.3* 29.8*  MCV 90.7  --   --  89.6 87.5 92.8  PLT 296  --   --  242 212 205     Coagulation Studies: Recent Labs    05/07/22 1948  LABPROT 12.5  INR 0.9    Imaging MRI brain without contrast 05/08/2022:  1.Small foci of acute/subacute infarcts within the left side of the pons and right forceps major. 2. Likely punctate  subacute infarcts in the deep white matter of the bilateral frontal lobes. 3. Extensive chronic microvascular ischemic changes of white matter with multiple associated microhemorrhages, similar to prior. 4. Bilateral mastoid effusion, greater on the left.   ASSESSMENT AND PLAN: 62 old male presented with new onset seizures in the setting hyperglycemic hyperosmolar nonketotic coma.   New onset seizures Hyperglycemic hyperosmolar nonketotic coma Acute metabolic encephalopathy, improving Acute ischemic strokes (incidental) CKD -Etiology of seizures: Suspected provoked in the setting of hyperglycemia -Acute encephalopathy likely due to hyperglycemia and seizures -Acute ischemic stroke are likely incidental and not causing any current symptoms.  Patient has significant intracranial stenosis from previous review of neurology notes but is not a candidate for dual antiplatelets due to increased risk of bleeding as patient has multiple microhemorrhages on MRI   Recommendations -Continue Keppra 500 mg twice daily .  -Recommend continuing aspirin 81 mg daily and atorvastatin 40 mg daily -We will obtain TTE to look for any thrombus.  If normal, recommend 30-day event monitor to look  for paroxysmal A-fib.  Given the patient is not a candidate for anticoagulation long-term, if we find paroxysmal A-fib, he may be a candidate for brief anticoagulation and watchman's device -Goal blood pressure: Normotension -PT/OT -Continue seizure precautions -Management of rest of comorbidities per primary team -Recommend follow-up with neurology in 3 months  Seizure precautions: Per [facility] statutes, patients with seizures are not allowed to drive until they have been seizure-free for six months and cleared by a physician    Use caution when using heavy equipment or power tools. Avoid working on ladders or at heights. Take showers instead of baths. Ensure the water temperature is not too high on the home  water heater. Do not go swimming alone. Do not lock yourself in a room alone (i.e. bathroom). When caring for infants or small children, sit down when holding, feeding, or changing them to minimize risk of injury to the child in the event you have a seizure. Maintain good sleep hygiene. Avoid alcohol.    If patient has another seizure, call 911 and bring them back to the ED if: A.  The seizure lasts longer than 5 minutes.      B.  The patient doesn't wake shortly after the seizure or has new problems such as difficulty seeing, speaking or moving following the seizure C.  The patient was injured during the seizure D.  The patient has a temperature over 102 F (39C) E.  The patient vomited during the seizure and now is having trouble breathing    During the Seizure   - First, ensure adequate ventilation and place patients on the floor on their left side  Loosen clothing around the neck and ensure the airway is patent. If the patient is clenching the teeth, do not force the mouth open with any object as this can cause severe damage - Remove all items from the surrounding that can be hazardous. The patient may be oblivious to what's happening and may not even know what he or she is doing. If the patient is confused and wandering, either gently guide him/her away and block access to outside areas - Reassure the individual and be comforting - Call 911. In most cases, the seizure ends before EMS arrives. However, there are cases when seizures may last over 3 to 5 minutes. Or the individual may have developed breathing difficulties or severe injuries. If a pregnant patient or a person with diabetes develops a seizure, it is prudent to call an ambulance. - Finally, if the patient does not regain full consciousness, then call EMS. Most patients will remain confused for about 45 to 90 minutes after a seizure, so you must use judgment in calling for help.    After the Seizure (Postictal Stage)   After a  seizure, most patients experience confusion, fatigue, muscle pain and/or a headache. Thus, one should permit the individual to sleep. For the next few days, reassurance is essential. Being calm and helping reorient the person is also of importance.   Most seizures are painless and end spontaneously. Seizures are not harmful to others but can lead to complications such as stress on the lungs, brain and the heart. Individuals with prior lung problems may develop labored breathing and respiratory distress.    I have spent a total of  37 minutes with the patient reviewing hospital notes,  test results, labs and examining the patient as well as establishing an assessment and plan.  > 50% of time was spent in direct  patient care.   Zeb Comfort Epilepsy [facility] For questions after 5pm please refer to AMION to reach the Neurologist on call

## 2022-05-09 NOTE — Consult Note (Signed)
WOC Nurse Consult Note: Patient receiving care in [facility] 4N21. Primary RN in room at time of my assessment. Reason for Consult: "sacral wound, DTPI? Open with macerated peri-wound area" Wound type: MASD-IAD--patient with C. Diff and liquid stool, plus a small stage 2 to the left upper buttock area adjacent to the inter-gluteal cleft. All POA. Drainage (amount, consistency, odor) none Periwound: intact with healed scattered hypopigmented areas. Dressing procedure/placement/frequency: Apply Triple Paste to buttocks, posterior upper thighs, all areas impacted by fecal incontinence. Do NOT place a foam dressing over the triple paste. This is a TID order.  Monitor the wound area(s) for worsening of condition such as: Signs/symptoms of infection,  Increase in size,  Development of or worsening of odor, Development of pain, or increased pain at the affected locations.  Notify the medical team if any of these develop.  Thank you for the consult.  Discussed plan of care with the bedside nurse.  WOC nurse will not follow at this time.  Please re-consult the WOC team if needed.  Helmut Muster, RN, MSN, CWOCN, CNS-BC, pager [phone]

## 2022-05-09 NOTE — Progress Notes (Signed)
NAME:  Manuel Kramer, MRN:  [id number], DOB:  11/14/1955, LOS: 2 ADMISSION DATE:  05/07/2022, CONSULTATION DATE:  7/9 REFERRING MD:  Jamal Maes REASON FOR CONSULT:  Acute encephalopathy   History of Present Illness:  66 y/o male admitted on 7/10 after he was found unresponsive in his nursing home with concern for seizure activity.  Noted to be profoundly hyperglycemic on admission.    Pertinent  Medical History  Recent C. difficile infection Diabetes Hypertension CKD 3B Hyperlipidemia HFpEF Deconditioning  Significant Hospital Events: Including procedures, antibiotic start and stop dates in addition to other pertinent events   7/9 admission; intubated for airway protection  Tubes/lines: 7/10 ETT> 7/11  Studies: 7/9 CT head> chronic atrophy/microvascular disease, NAICP 7/10 CT chest > cardiomegally and aortic tortuosity, dependent atelectasis, possible RLL consolidation 7/10 MRI Brain > small foci of acute/subacute infarcts left pons R forceps major, punctate infarcts in deep white matter of bilateral frontal lobes, extensive chronic microvascular ischemic changes of white matter, bilateral mastoid effusion  Antibiotics: 7/9 vanc > 7/10 7/10 rocephin >   Interim History / Subjective:   More awake and alert BP elevated Following commands Passing SBT  Objective   Blood pressure (!) 173/89, pulse 71, temperature 97.7 F (36.5 C), temperature source Axillary, resp. rate (!) 24, height 5\' 6"  (1.676 m), weight 61.2 kg, SpO2 100 %.    Vent Mode: PSV;CPAP FiO2 (%):  [40 %] 40 % Set Rate:  [20 bmp] 20 bmp Vt Set:  [430 mL] 430 mL PEEP:  [5 cmH20] 5 cmH20 Pressure Support:  [10 cmH20] 10 cmH20 Plateau Pressure:  [17 cmH20-19 cmH20] 17 cmH20   Intake/Output Summary (Last 24 hours) at 05/09/2022 0745 Last data filed at 05/09/2022 0700 Gross per 24 hour  Intake 1481.88 ml  Output 915 ml  Net 566.88 ml   Filed Weights   05/07/22 1934  Weight: 61.2 kg     Examination:  General:  In bed on vent HENT: NCAT ETT in place PULM: CTA B, vent supported breathing CV: RRR, no mgr GI: BS+, soft, nontender MSK: normal bulk and tone Neuro: awake, following commands    Resolved Hospital Problem list     Assessment & Plan:  Hyperglycemic hyperosmolar nonketotic coma (HCC) Improved Has labile blood sugar now (early AM low glucose) Stop night time glarine Continue resistant scale Advance diet today  Acute metabolic encephalopathy Due to HHS> improved Unclear significance of MRI findings (pons subacute infarct, as above) D/c PAD protocol Frequent orientation F/u neuro recommendations   Acute renal failure superimposed on stage 3b chronic kidney disease (HCC) Now at baseline Monitor BMET and UOP Replace electrolytes as needed   Acute respiratory failure with hypoxemia (HCC) Due to metabolic encephlopathy > improved Wean to extubate today VAP prevention Aspiration precautions  Anemia of chronic disease Monitor for bleeding Transfuse PRBC for Hgb < 7 gm/dL   C. difficile colitis Continue oral vanc> change oral vanc stop date to 14 days after ceftriaxone end date Stop PPI  Hypertensive emergency Goal SBP < 180 Continue:  Coreg Amlodipine Clonidine hydralazine Increase losartan to 100mg  daily Add spironolactone  Chronic diastolic CHF (congestive heart failure) (HCC) Antihypertensives as above Isordil Restart losartan Double torsemide dose given anasarca today  Hyperlipidemia Statin to continue  Protein-calorie malnutrition, severe Start tube feeding  Hypokalemia Replete today  Aspiration pneumonia (HCC) Continue ceftriaxone > plan 5 days   Seizure disorder (HCC) Due to hyperglycemia and hypertension Continue keppra F/u neurology recommendations   Best Practice (right  click and "Reselect all SmartList Selections" daily)   Diet/type: tubefeeds DVT prophylaxis: systemic heparin GI prophylaxis:  H2B Lines: N/A Foley:  Yes, and it is still needed Code Status:  full code Last date of multidisciplinary goals of care discussion [7/10, updated son [name], full code; daughter Elzie Rings updated by phone on 7/11 AM]     Critical care time: 35 minutes    Heber Branch, MD [place] [facility] Pager: [phone] Cell: [phone] After 7:00 pm call Elink  [phone]

## 2022-05-09 NOTE — Progress Notes (Signed)
Hypoglycemic Event  CBG: 66  Treatment: 4 oz juice/soda  Symptoms: None  Follow-up CBG: Time:1952 CBG Result: 87  Possible Reasons for Event: Inadequate meal intake  Pt  watching TV, in no visible distress. VS are stable, no change in exam. Will continue to encourage meal/fluid intake.   Jannifer Hick, RN

## 2022-05-09 NOTE — Progress Notes (Signed)
Pt placed on PS/CPAP 10/5 on 40% and is tolerating well. RT will monitor. 

## 2022-05-09 NOTE — Procedures (Signed)
Extubation Procedure Note  Patient Details:   Name: Manuel Kramer DOB: Apr 06, 1956 MRN: [id number]   Airway Documentation:    Vent end date: 05/09/22 Vent end time: 0904   Evaluation  O2 sats: stable throughout Complications: No apparent complications Patient did tolerate procedure well. Bilateral Breath Sounds: Clear, Diminished   Yes  Pt extubated to 4l [place] with RN at bedside. Positive cuff leak noted and pt is tolerating well. RT will monitor.  Forest Becker 05/09/2022, 9:05 AM

## 2022-05-09 NOTE — Progress Notes (Signed)
eLink Physician-Brief Progress Note Patient Name: Manuel Kramer DOB: 07-15-1956 MRN: [id number]   Date of Service  05/09/2022  HPI/Events of Note  Patient is oliguric, his volume status is not clear, no central line to transduce for CVP,  iv fluids were stopped on the day shift.  eICU Interventions  Will check a BNP to try to gauge overall volume status, and proceed from there.        Manuel Kramer [name] 05/09/2022, 1:32 AM

## 2022-05-09 NOTE — Assessment & Plan Note (Addendum)
Due to hyperglycemia and hypertension Continue keppra 500mg  bid per neuro Neurology recommendations

## 2022-05-10 ENCOUNTER — Inpatient Hospital Stay (HOSPITAL_COMMUNITY): Payer: Medicare Other

## 2022-05-10 DIAGNOSIS — I639 Cerebral infarction, unspecified: Secondary | ICD-10-CM

## 2022-05-10 DIAGNOSIS — I6389 Other cerebral infarction: Secondary | ICD-10-CM | POA: Diagnosis not present

## 2022-05-10 DIAGNOSIS — E1101 Type 2 diabetes mellitus with hyperosmolarity with coma: Secondary | ICD-10-CM | POA: Diagnosis not present

## 2022-05-10 HISTORY — DX: Cerebral infarction, unspecified: I63.9

## 2022-05-10 LAB — GLUCOSE, CAPILLARY
Glucose-Capillary: 114 mg/dL — ABNORMAL HIGH (ref 70–99)
Glucose-Capillary: 195 mg/dL — ABNORMAL HIGH (ref 70–99)
Glucose-Capillary: 317 mg/dL — ABNORMAL HIGH (ref 70–99)
Glucose-Capillary: 281 mg/dL — ABNORMAL HIGH (ref 70–99)
Glucose-Capillary: 211 mg/dL — ABNORMAL HIGH (ref 70–99)

## 2022-05-10 LAB — ECHOCARDIOGRAM COMPLETE
Area-P 1/2: 3.81 cm2
P 1/2 time: 713 msec
S' Lateral: 2.4 cm

## 2022-05-10 LAB — BASIC METABOLIC PANEL
Anion gap: 7 (ref 5–15)
BUN: 30 mg/dL — ABNORMAL HIGH (ref 8–23)
CO2: 25 mmol/L (ref 22–32)
Calcium: 8.2 mg/dL — ABNORMAL LOW (ref 8.9–10.3)
Chloride: 110 mmol/L (ref 98–111)
Creatinine, Ser: 2 mg/dL — ABNORMAL HIGH (ref 0.61–1.24)
GFR, Estimated: 36 mL/min — ABNORMAL LOW (ref >60)
Glucose, Bld: 134 mg/dL — ABNORMAL HIGH (ref 70–99)
Potassium: 3.9 mmol/L (ref 3.5–5.1)
Sodium: 142 mmol/L (ref 135–145)

## 2022-05-10 MED ORDER — INSULIN GLARGINE-YFGN 100 UNIT/ML ~~LOC~~ SOLN
10.0000 [IU] | Freq: Two times a day (BID) | SUBCUTANEOUS | Status: DC
Start: 2022-05-10 — End: 2022-05-11
  Administered 2022-05-10 – 2022-05-11 (×2): 10 [IU] via SUBCUTANEOUS
  Filled 2022-05-10 (×3): qty 0.1

## 2022-05-10 MED ORDER — INSULIN ASPART 100 UNIT/ML IJ SOLN
10.0000 [IU] | Freq: Once | INTRAMUSCULAR | Status: AC
  Administered 2022-05-10: 10 [IU] via SUBCUTANEOUS

## 2022-05-10 MED ORDER — ASPIRIN 81 MG PO CHEW
81.0000 mg | CHEWABLE_TABLET | Freq: Every day | ORAL | Status: DC
Start: 2022-05-10 — End: 2022-05-12
  Administered 2022-05-10 – 2022-05-12 (×3): 81 mg via ORAL
  Filled 2022-05-10 (×3): qty 1

## 2022-05-10 MED ORDER — INSULIN ASPART 100 UNIT/ML IJ SOLN: 0.0000 [IU] | Freq: Every day | INTRAMUSCULAR | Status: DC

## 2022-05-10 MED ORDER — GLUCERNA SHAKE PO LIQD
237.0000 mL | Freq: Two times a day (BID) | ORAL | Status: DC
  Administered 2022-05-12: 237 mL via ORAL
  Filled 2022-05-10: qty 237

## 2022-05-10 MED ORDER — INSULIN ASPART 100 UNIT/ML IJ SOLN
0.0000 [IU] | Freq: Three times a day (TID) | INTRAMUSCULAR | Status: DC
  Administered 2022-05-10: 15 [IU] via SUBCUTANEOUS
  Administered 2022-05-10: 11 [IU] via SUBCUTANEOUS
  Administered 2022-05-11: 4 [IU] via SUBCUTANEOUS
  Administered 2022-05-12: 7 [IU] via SUBCUTANEOUS

## 2022-05-10 NOTE — Assessment & Plan Note (Signed)
F/U TTE ordered per neuro Goal glucose 140-180 Goal SBP < 180 Continue asa Continue hypertension management as ordered

## 2022-05-10 NOTE — Progress Notes (Signed)
[facility]  Hyperglycemia noted Given 15 units aspart per SSI Will give another 10 Units now and will add back nighttime glargine  Heber Calwa, MD [place] [facility] Pager: [phone] Cell: [phone] After 7:00 pm call Elink  [phone]

## 2022-05-10 NOTE — Assessment & Plan Note (Signed)
Continue daily glargine 10 U Continue resistant scale Carb modified diet

## 2022-05-10 NOTE — Progress Notes (Signed)
Echocardiogram 2D Echocardiogram has been performed.  Warren Lacy Santhiago Collingsworth RDCS 05/10/2022, 2:22 PM

## 2022-05-10 NOTE — Evaluation (Signed)
Physical Therapy Evaluation Patient Details Name: Manuel Kramer MRN: [id number] DOB: [birth year] Today's Date: 05/10/2022  History of Present Illness  The pt is a 66 yo male presenting from [facility] on 7/9 with seizure. Intubated in ED due to ongoing AMS, BG > 600. PMH includes: DM II, HTN, HFpEF, CKD III, c diff, HLD, and anemia.   Clinical Impression  Pt in bed upon arrival of PT, agreeable to evaluation at this time. The pt was unable to provide information regarding PLOF or mobility/assist he receives at [facility], but was walking up to 25 ft with use of RW and minA when admitted in April 2023. The pt now presents with limitations in functional mobility, strength, stability, power, and activity tolerance due to above dx, and will continue to benefit from skilled PT to address these deficits. The pt required assist or UE support to maintain static sitting EOB, and was unable to achieve stand despite maxA of 1-2 from EOB. The pt will continue to benefit from skilled PT acutely to progress functional strength and capacity for transfers as well as continued rehab after d/c to facilitate return to prior level of mobility.        Recommendations for follow up therapy are one component of a multi-disciplinary discharge planning process, led by the attending physician.  Recommendations may be updated based on patient status, additional functional criteria and insurance authorization.  Follow Up Recommendations Skilled nursing-short term rehab (<3 hours/day) Can patient physically be transported by private vehicle: No    Assistance Recommended at Discharge Frequent or constant Supervision/Assistance  Patient can return home with the following  Two people to help with walking and/or transfers;Two people to help with bathing/dressing/bathroom;Assistance with cooking/housework;Direct supervision/assist for medications management;Direct supervision/assist for financial management;Assist for  transportation;Help with stairs or ramp for entrance    Equipment Recommendations  (defer to post acute)  Recommendations for Other Services  OT consult    Functional Status Assessment Patient has had a recent decline in their functional status and demonstrates the ability to make significant improvements in function in a reasonable and predictable amount of time.     Precautions / Restrictions Precautions Precautions: Fall Precaution Comments: seizure Restrictions Weight Bearing Restrictions: No      Mobility  Bed Mobility Overal bed mobility: Needs Assistance Bed Mobility: Supine to Sit, Sit to Supine     Supine to sit: Mod assist Sit to supine: Max assist   General bed mobility comments: modA to complete transition to sitting with increased time for LE movements, cues for sequencing with UE, and assist with bed pad to complete. maxA to return to supine    Transfers Overall transfer level: Needs assistance Equipment used: 1 person hand held assist, 2 person hand held assist Transfers: Sit to/from Stand Sit to Stand: Max assist, +2 physical assistance           General transfer comment: pt unable to complete despite mod-maxA of 2 and x3 attempts. only able to lift hips 2-5 inches from bed    Ambulation/Gait               General Gait Details: pt unable     Balance Overall balance assessment: Needs assistance Sitting-balance support: Single extremity supported, Feet supported Sitting balance-Leahy Scale: Poor     Standing balance support: Bilateral upper extremity supported Standing balance-Leahy Scale: Zero Standing balance comment: pt unable to achieve full stand  Pertinent Vitals/Pain Pain Assessment Pain Assessment: Faces Faces Pain Scale: Hurts whole lot Pain Location: bottom (sacral wound) Pain Descriptors / Indicators: Discomfort, Grimacing Pain Intervention(s): Limited activity within patient's  tolerance, Monitored during session, Repositioned    Home Living Family/patient expects to be discharged to:: Skilled nursing facility                   Additional Comments: LTC at [facility]    Prior Function Prior Level of Function : Needs assist             Mobility Comments: pt unreliable historian, when last admitted in April required modA to stand. Pt states he has no issues with mobility ADLs Comments: pt states no assist needed but is unreliable historian. needed assist with ADLs and bathing at last admission in April     Hand Dominance   Dominant Hand: Right    Extremity/Trunk Assessment   Upper Extremity Assessment Upper Extremity Assessment: Generalized weakness    Lower Extremity Assessment Lower Extremity Assessment: Generalized weakness (limited ROM bilaterally, unable to attain full knee extension or flex past 100, reports no change in sensation)    Cervical / Trunk Assessment Cervical / Trunk Assessment: Kyphotic  Communication   Communication: No difficulties  Cognition Arousal/Alertness: Awake/alert Behavior During Therapy: WFL for tasks assessed/performed Overall Cognitive Status: Impaired/Different from baseline Area of Impairment: Orientation, Attention, Memory, Following commands, Safety/judgement, Awareness, Problem solving                 Orientation Level: Disoriented to, Person, Time, Place (pt states he is at [employer], it is 2010 or 2012, does not know date, month, time of year (summer or winter), or his own age. is aware he had a seizure) Current Attention Level: Focused Memory: Decreased recall of precautions, Decreased short-term memory Following Commands: Follows one step commands consistently Safety/Judgement: Decreased awareness of safety, Decreased awareness of deficits Awareness: Intellectual Problem Solving: Slow processing, Decreased initiation General Comments: pt with poor memory and orientation, motivated and  eager to attempt mobility, no recall of PLOF        General Comments General comments (skin integrity, edema, etc.): VSS on RA    Exercises     Assessment/Plan    PT Assessment Patient needs continued PT services  PT Problem List Decreased strength;Decreased range of motion;Decreased activity tolerance;Decreased balance;Decreased mobility;Decreased coordination;Decreased cognition;Decreased safety awareness       PT Treatment Interventions DME instruction;Gait training;Functional mobility training;Therapeutic activities;Therapeutic exercise;Balance training;Neuromuscular re-education;Patient/family education    PT Goals (Current goals can be found in the Care Plan section)  Acute Rehab PT Goals Patient Stated Goal: none stated PT Goal Formulation: With patient Time For Goal Achievement: 05/24/22 Potential to Achieve Goals: Fair    Frequency Min 3X/week        AM-PAC PT "6 Clicks" Mobility  Outcome Measure Help needed turning from your back to your side while in a flat bed without using bedrails?: A Lot Help needed moving from lying on your back to sitting on the side of a flat bed without using bedrails?: A Lot Help needed moving to and from a bed to a chair (including a wheelchair)?: Total Help needed standing up from a chair using your arms (e.g., wheelchair or bedside chair)?: Total Help needed to walk in hospital room?: Total Help needed climbing 3-5 steps with a railing? : Total 6 Click Score: 8    End of Session Equipment Utilized During Treatment: Gait belt Activity Tolerance: Patient tolerated treatment well;Patient  limited by pain Patient left: in bed;with call bell/phone within reach;with bed alarm set Nurse Communication: Mobility status PT Visit Diagnosis: Other abnormalities of gait and mobility (R26.89);Muscle weakness (generalized) (M62.81)    Time: 8299-3716 PT Time Calculation (min) (ACUTE ONLY): 29 min   Charges:   PT Evaluation $PT Eval  Moderate Complexity: 1 Mod PT Treatments $Therapeutic Activity: 8-22 mins        Vickki Muff, PT, DPT   Acute Rehabilitation Department  Ronnie Derby 05/10/2022, 1:10 PM

## 2022-05-10 NOTE — Progress Notes (Signed)
  Transition of Care [facility]) Screening Note   Patient Details  Name: Manuel Kramer Date of Birth: 09-29-1956   Transition of Care [facility]) CM/SW Contact:    Mearl Latin, LCSW Phone Number: 05/10/2022, 6:00 PM    Transition of Care Department [facility]) has reviewed patient and he resides at [facility]. We will continue to monitor patient advancement through interdisciplinary progression rounds. If new patient transition needs arise, please place a TOC consult.

## 2022-05-10 NOTE — Progress Notes (Signed)
NAME:  Manuel Kramer, MRN:  [id number], DOB:  1956-03-26, LOS: 3 ADMISSION DATE:  05/07/2022, CONSULTATION DATE:  7/9 REFERRING MD:  Jamal Maes REASON FOR CONSULT:  Acute encephalopathy   History of Present Illness:  66 y/o male admitted on 7/10 after he was found unresponsive in his nursing home with concern for seizure activity.  Noted to be profoundly hyperglycemic on admission.    Pertinent  Medical History  Recent C. difficile infection Diabetes Hypertension CKD 3B Hyperlipidemia HFpEF Deconditioning  Significant Hospital Events: Including procedures, antibiotic start and stop dates in addition to other pertinent events   7/9 admission; intubated for airway protection 7/11 extubated, mental status improved  Tubes/lines: 7/10 ETT> 7/11  Studies: 7/9 CT head> chronic atrophy/microvascular disease, NAICP 7/10 CT chest > cardiomegally and aortic tortuosity, dependent atelectasis, possible RLL consolidation 7/10 MRI Brain > small foci of acute/subacute infarcts left pons R forceps major, punctate infarcts in deep white matter of bilateral frontal lobes, extensive chronic microvascular ischemic changes of white matter, bilateral mastoid effusion  Antibiotics: 7/9 vanc > 7/10 7/10 rocephin >   Interim History / Subjective:   extubated, mental status improved, no further seizures, remains hypertensive  Objective   Blood pressure (!) 159/68, pulse 72, temperature 97.6 F (36.4 C), temperature source Oral, resp. rate 14, height 5\' 6"  (1.676 m), weight 71.9 kg, SpO2 97 %.    Vent Mode: PSV;CPAP FiO2 (%):  [40 %] 40 % PEEP:  [5 cmH20] 5 cmH20 Pressure Support:  [10 cmH20] 10 cmH20   Intake/Output Summary (Last 24 hours) at 05/10/2022 07/11/2022 Last data filed at 05/10/2022 0500 Gross per 24 hour  Intake 993.87 ml  Output 851 ml  Net 142.87 ml   Filed Weights   05/07/22 1934 05/10/22 0500  Weight: 61.2 kg 71.9 kg    Examination:  General:  Chronically ill appearing,  resting comfortably in bed HENT: NCAT OP clear PULM: CTA B, normal effort CV: RRR, no mgr GI: BS+, soft, nontender MSK: normal bulk and tone Neuro: awake, alert, no distress, MAEW    Resolved Hospital Problem list   Acute respiratory failure with hypoxemia Hyperosmolar hyperglycemic non-ketotic coma Hypokalemia  Assessment & Plan:  Acute metabolic encephalopathy Due to HHS> improved Unclear significance of MRI findings (pons subacute infarct, as above) D/c PAD protocol Frequent orientation F/u neuro recommendations   Acute renal failure superimposed on stage 3b chronic kidney disease (HCC) Now at baseline Monitor BMET and UOP Replace electrolytes as needed   Acute respiratory failure with hypoxemia (HCC) Due to metabolic encephlopathy > improved Wean to extubate today VAP prevention Aspiration precautions  Anemia of chronic disease Monitor for bleeding Transfuse PRBC for Hgb < 7 gm/dL   C. difficile colitis Continue oral vanc> change oral vanc stop date to 7 days after ceftriaxone end date   Hypertensive emergency Very difficult to control Goal SBP < 180 Continue:  Coreg Amlodipine Clonidine hydralazine Continue losartan to 100mg  daily Continue spironolactone May need to consider minoxidil  Chronic diastolic CHF (congestive heart failure) (HCC) Antihypertensives as above Isordil Continue osartan Continue losartan at increased dose compared to baseline today given ongoing anasarca  Hyperlipidemia Statin to continue  Protein-calorie malnutrition, severe Carb modified diet  Hypokalemia resolved  Aspiration pneumonia (HCC) Continue ceftriaxone > plan 5 days   Seizure disorder (HCC) Due to hyperglycemia and hypertension Continue keppra 500mg  bid per neuro Neurology recommendations  Type 2 diabetes mellitus with complication, with long-term current use of insulin (HCC) Continue daily glargine 10  U Continue resistant scale Carb modified  diet  Acute ischemic stroke (HCC) > small acute to subacute pontine infarct seen on MRI F/U TTE ordered per neuro Goal glucose 140-180 Goal SBP < 180 Continue asa Continue hypertension management as ordered   Best Practice (right click and "Reselect all SmartList Selections" daily)   Diet/type: tubefeeds DVT prophylaxis: systemic heparin GI prophylaxis: H2B Lines: N/A Foley:  Yes, and it is still needed Code Status:  full code Last date of multidisciplinary goals of care discussion [7/10, updated son [name], full code; daughter Elzie Rings updated by phone on 7/11 AM]   Move to progressive care  Critical care time: n/a    Heber Drummond, MD [facility] Pager: [phone] Cell: [phone] After 7:00 pm call Elink  [phone]

## 2022-05-10 NOTE — Progress Notes (Signed)
Nutrition Follow-up  DOCUMENTATION CODES:   Not applicable  INTERVENTION:   Glucerna Shake po BID, each supplement provides 220 kcal and 10 grams of protein  Encourage adequate PO intake to support wound healing  MVI with minerals daily    NUTRITION DIAGNOSIS:   Increased nutrient needs related to wound healing as evidenced by estimated needs. Ongoing.   GOAL:   Patient will meet greater than or equal to 90% of their needs Progressing.   MONITOR:   PO intake, Supplement acceptance  REASON FOR ASSESSMENT:   Ventilator, Consult Enteral/tube feeding initiation and management  ASSESSMENT:   66 yo male admitted with new onset seizures, hyperglycemic hyperosmolar  nonketotic coma, AMS requiring intubation. PMH includes DM, HTN, KD 3, HLD, CHF  Pt discussed during ICU rounds and with RN. Per RN pt ate well for Breakfast this am.   Weight on admission: 61.2 kg Current weight: 71.9 kg  Needs repeat exam as edema subsides.  Pt with deep pitting edema  7/9 admission; intubated 7/11 extubated; diet advanced   Medications reviewed and include: Vitamin D, SSI, semglee, MVI with minerals, niacin, [place], spironolactone   Labs reviewed:  CBG's: 66-317   Diet Order:   Diet Order             Diet heart healthy/carb modified Room service appropriate? Yes with Assist; Fluid consistency: Thin  Diet effective now                   EDUCATION NEEDS:   Not appropriate for education at this time  Skin:  Skin Assessment: Skin Integrity Issues: Skin Integrity Issues:: Stage II, Other (Comment) Stage II: coccyx, thigh Other: Dermatitis: buttocks/groin  Last BM:  7/12  Height:   Ht Readings from Last 1 Encounters:  05/08/22 5\' 6"  (1.676 m)    Weight:   Wt Readings from Last 1 Encounters:  05/10/22 71.9 kg    BMI:  Body mass index is 25.58 kg/m.  Estimated Nutritional Needs:   Kcal:  1900-2100 kcals  Protein:  100-115 g  Fluid:  >/= 1.9  L  Tuck Dulworth P., RD, LDN, CNSC See AMiON for contact information

## 2022-05-11 DIAGNOSIS — E1101 Type 2 diabetes mellitus with hyperosmolarity with coma: Secondary | ICD-10-CM | POA: Diagnosis not present

## 2022-05-11 LAB — GLUCOSE, CAPILLARY
Glucose-Capillary: 147 mg/dL — ABNORMAL HIGH (ref 70–99)
Glucose-Capillary: 74 mg/dL (ref 70–99)
Glucose-Capillary: 46 mg/dL — ABNORMAL LOW (ref 70–99)
Glucose-Capillary: 69 mg/dL — ABNORMAL LOW (ref 70–99)
Glucose-Capillary: 82 mg/dL (ref 70–99)
Glucose-Capillary: 188 mg/dL — ABNORMAL HIGH (ref 70–99)
Glucose-Capillary: 114 mg/dL — ABNORMAL HIGH (ref 70–99)
Glucose-Capillary: 79 mg/dL (ref 70–99)

## 2022-05-11 LAB — BASIC METABOLIC PANEL
Anion gap: 7 (ref 5–15)
BUN: 27 mg/dL — ABNORMAL HIGH (ref 8–23)
CO2: 27 mmol/L (ref 22–32)
Calcium: 8.4 mg/dL — ABNORMAL LOW (ref 8.9–10.3)
Chloride: 105 mmol/L (ref 98–111)
Creatinine, Ser: 1.98 mg/dL — ABNORMAL HIGH (ref 0.61–1.24)
GFR, Estimated: 37 mL/min — ABNORMAL LOW (ref >60)
Glucose, Bld: 76 mg/dL (ref 70–99)
Potassium: 3.9 mmol/L (ref 3.5–5.1)
Sodium: 139 mmol/L (ref 135–145)

## 2022-05-11 MED ORDER — INSULIN ASPART 100 UNIT/ML IJ SOLN
3.0000 [IU] | Freq: Three times a day (TID) | INTRAMUSCULAR | Status: DC
  Administered 2022-05-11 – 2022-05-12 (×2): 3 [IU] via SUBCUTANEOUS

## 2022-05-11 MED ORDER — INSULIN GLARGINE-YFGN 100 UNIT/ML ~~LOC~~ SOLN
12.0000 [IU] | Freq: Every day | SUBCUTANEOUS | Status: DC
  Administered 2022-05-12: 12 [IU] via SUBCUTANEOUS
  Filled 2022-05-11: qty 0.12

## 2022-05-11 MED ORDER — ORAL CARE MOUTH RINSE: 15.0000 mL | OROMUCOSAL | Status: DC | PRN

## 2022-05-11 MED ORDER — ISOSORBIDE DINITRATE 30 MG PO TABS
30.0000 mg | ORAL_TABLET | Freq: Three times a day (TID) | ORAL | Status: DC
  Administered 2022-05-11 – 2022-05-12 (×2): 30 mg via ORAL
  Filled 2022-05-11 (×5): qty 1

## 2022-05-11 NOTE — Progress Notes (Signed)
Pt transferred to 3 [facility] 09. Pt placed on monitor, call bell in hand. Bed alarm on. CN Tracey aware of pt's arrival.

## 2022-05-11 NOTE — Progress Notes (Signed)
PROGRESS NOTE  Manuel Kramer  [id number] DOB: February 13, 1956 DOA: 05/07/2022 PCP: Vonna Drafts, FNP   Brief Narrative: Patient is a 66 year old male with history of diabetes type 2, hypertension, CKD stage IIIb, hyperlipidemia, diastolic congestive heart failure who was brought here from nursing facility after he was found to be unresponsive, there was concern for seizure activity.  On presentation, he was hypoglycemic.  Patient had to be intubated for airway protection.  MRI of the brain showed acute/subacute infarcts in the left pons, punctate infarct in the deep white matter of bilateral frontal lobes.  Neurology were following.  Extubated on 7/11, transferred to [facility] service on 7/13.  Plan is to send him back to skilled nursing facility.   Assessment & Plan:  Active Problems:   Seizure disorder (HCC)   Aspiration pneumonia (HCC)   Acute renal failure superimposed on stage 3b chronic kidney disease (HCC)   C. difficile colitis   Type 2 diabetes mellitus with complication, with long-term current use of insulin (HCC)   Hypertensive emergency   Hyperlipidemia   Anemia of chronic disease   Chronic diastolic CHF (congestive heart failure) (HCC)   Protein-calorie malnutrition, severe   Acute metabolic encephalopathy   Pressure injury of skin   Acute ischemic stroke (HCC) > small acute to subacute pontine infarct seen on MRI   Acute metabolic encephalopathy: Suspected to be from severe hyperglycemia, seizure.  Mental status slowly improving.  MRI showed acute/subacute infarct as above.  Neurology following.  Still intermittently  confused but mostly oriented  Seizure disorder: Likely secondary to hyperglycemia and severe hypertension.  Neurology following.  On Keppra 500 mg twice a day.  Acute ischemic stroke: MRI of the brain showed  acute/subacute infarcts in the left pons, punctate infarct in the deep white matter of bilateral frontal lobes.  Neurology were following.  Continue  aspirin,palvix. Echo showed EF of 60 to [id number], grade 1 diastolic dysfunction,  No shunt  AKI on CKD stage IIIb: Currently kidney function at baseline.  Monitor.  On torsemide  Acute hypoxic respiratory failure: Had to be intubated on presentation for airway protection.  Extubated.  Continue aspiration precaution.Hospital course also remarkable for finding of aspiration pneumonia, on ceftriaxone.  Plan for total of 5 days course. Currently on room air  Type 2 diabetes: Continue current insulin regimen, monitor blood sugar.DC following.  Very erratic blood sugars  Normocytic anemia: Likely associated  with chronic kidney disease.  Continue to monitor  C. difficile colitis: Was undergoing treatment for C. difficile colitis at skilled nursing facility.  On oral vancomycin.  Continue vanco  for  7 days after end of ceftriaxone  Hypertensive emergency: Persistent.  On Coreg, amlodipine, clonidine, hydralazine,imdur, losartan, spironolactone.We will continue to monitor the blood pressure and titrate the medications  Debility/deconditioning: Patient is from nursing facility, expect discharge to the same facility .TOC following   Nutrition Problem: Increased nutrient needs Etiology: wound healing Pressure Injury 05/07/22 Thigh Posterior;Proximal;Right Stage 2 -  Partial thickness loss of dermis presenting as a shallow open injury with a red, pink wound bed without slough. (Active)  05/07/22 2200  Location: Thigh  Location Orientation: Posterior;Proximal;Right  Staging: Stage 2 -  Partial thickness loss of dermis presenting as a shallow open injury with a red, pink wound bed without slough.  Wound Description (Comments):   Present on Admission: Yes  Dressing Type None 05/10/22 2000     Pressure Injury 05/07/22 Coccyx Medial;Left;Right Stage 2 -  Partial thickness loss of dermis  presenting as a shallow open injury with a red, pink wound bed without slough. (Active)  05/07/22 2200  Location: Coccyx   Location Orientation: Medial;Left;Right  Staging: Stage 2 -  Partial thickness loss of dermis presenting as a shallow open injury with a red, pink wound bed without slough.  Wound Description (Comments):   Present on Admission: Yes  Dressing Type None 05/09/22 2000    DVT prophylaxis:heparin injection 5,000 Units Start: 05/07/22 2200 SCDs Start: 05/07/22 2118     Code Status: Full Code  Family Communication:   Patient status:Inpatient  Patient is from :SNF  Anticipated discharge to:SNF  Estimated DC date:1-2 days   Consultants: PCCM,neurology  Procedures:Intubation  Antimicrobials:  Anti-infectives (From admission, onward)    Start     Dose/Rate Route Frequency Ordered Stop   05/09/22 1800  vancomycin (VANCOCIN) 50 mg/mL oral solution SOLN 125 mg        125 mg Oral 4 times daily 05/09/22 1345 05/18/22 2359   05/09/22 1000  vancomycin (VANCOREADY) IVPB 750 mg/150 mL  Status:  Discontinued        750 mg 150 mL/hr over 60 Minutes Intravenous Every 36 hours 05/07/22 2144 05/08/22 0851   05/08/22 0000  vancomycin (VANCOCIN) 50 mg/mL oral solution SOLN 125 mg  Status:  Discontinued        125 mg Per Tube 4 times daily 05/07/22 2301 05/09/22 1345   05/07/22 2200  cefTRIAXone (ROCEPHIN) 2 g in sodium chloride 0.9 % 100 mL IVPB        2 g 200 mL/hr over 30 Minutes Intravenous Every 24 hours 05/07/22 2134 05/11/22 2359   05/07/22 2200  vancomycin (VANCOCIN) capsule 125 mg  Status:  Discontinued        125 mg Oral 4 times daily 05/07/22 2144 05/07/22 2300   05/07/22 2145  vancomycin (VANCOREADY) IVPB 1250 mg/250 mL        1,250 mg 166.7 mL/hr over 90 Minutes Intravenous  Once 05/07/22 2144 05/08/22 0940       Subjective: Patient seen and examined at the bedside this morning.  Hemodynamically stable during my evaluation, he was overall comfortable, lying on the bed.  He knew that he is in the hospital and knows the current month.  Obeys commands, denies any new complaints.   Very poor historian.  Objective: Vitals:   05/11/22 0400 05/11/22 0500 05/11/22 0600 05/11/22 0700  BP: (!) 156/84 (!) 187/101 (!) 152/75 (!) 154/86  Pulse: 63 67 (!) 59 (!) 53  Resp: 20 17 14 13   Temp: 97.6 F (36.4 C)     TempSrc: Axillary     SpO2: 97% 98% 93% 98%  Weight:      Height:        Intake/Output Summary (Last 24 hours) at 05/11/2022 0814 Last data filed at 05/11/2022 0600 Gross per 24 hour  Intake 100 ml  Output 2050 ml  Net -1950 ml   Filed Weights   05/07/22 1934 05/10/22 0500  Weight: 61.2 kg 71.9 kg    Examination:  General exam: Overall comfortable, not in distress, deconditioned HEENT: PERRL Respiratory system:  no wheezes or crackles  Cardiovascular system: S1 & S2 heard, RRR.  Gastrointestinal system: Abdomen is nondistended, soft and nontender. Central nervous system: Alert and awake,oriented to place, knows month Extremities: No edema, no clubbing ,no cyanosis Skin: No rashes, no ulcers,no icterus     Data Reviewed: I have personally reviewed following labs and imaging studies  CBC: Recent Labs  Lab 05/07/22 1948 05/07/22 1958 05/07/22 2104 05/08/22 0022 05/08/22 0457 05/09/22 0426  WBC 9.1  --   --  10.4 10.5 10.4  NEUTROABS 7.5  --   --   --   --   --   HGB 11.9* 12.9* 11.9* 9.8* 9.7* 9.4*  HCT 36.9* 38.0* 35.0* 30.1* 29.3* 29.8*  MCV 90.7  --   --  89.6 87.5 92.8  PLT 296  --   --  242 212 205   Basic Metabolic Panel: Recent Labs  Lab 05/08/22 0022 05/08/22 0457 05/09/22 0426 05/10/22 0128 05/11/22 0255  NA 138 140 141 142 139  K 3.1* 3.2* 4.5 3.9 3.9  CL 103 109 113* 110 105  CO2 22 23 23 25 27   GLUCOSE 562* 315* 177* 134* 76  BUN 29* 29* 33* 30* 27*  CREATININE 2.17* 2.06* 2.19* 2.00* 1.98*  CALCIUM 8.5* 8.1* 8.1* 8.2* 8.4*  MG 2.0  --  2.0  --   --   PHOS  --  2.7 3.5  --   --      Recent Results (from the past 240 hour(s))  Resp Panel by RT-PCR (Flu A&B, Covid) Anterior Nasal Swab     Status: None    Collection Time: 05/07/22  8:49 PM   Specimen: Anterior Nasal Swab  Result Value Ref Range Status   SARS Coronavirus 2 by RT PCR NEGATIVE NEGATIVE Final    Comment: (NOTE) SARS-CoV-2 target nucleic acids are NOT DETECTED.  The SARS-CoV-2 RNA is generally detectable in upper respiratory specimens during the acute phase of infection. The lowest concentration of SARS-CoV-2 viral copies this assay can detect is 138 copies/mL. A negative result does not preclude SARS-Cov-2 infection and should not be used as the sole basis for treatment or other patient management decisions. A negative result may occur with  improper specimen collection/handling, submission of specimen other than nasopharyngeal swab, presence of viral mutation(s) within the areas targeted by this assay, and inadequate number of viral copies(<138 copies/mL). A negative result must be combined with clinical observations, patient history, and epidemiological information. The expected result is Negative.  Fact Sheet for Patients:  07/08/22  Fact Sheet for Healthcare Providers:  BloggerCourse.com  This test is no t yet approved or cleared by the [employer] FDA and  has been authorized for detection and/or diagnosis of SARS-CoV-2 by FDA under an Emergency Use Authorization (EUA). This EUA will remain  in effect (meaning this test can be used) for the duration of the COVID-19 declaration under Section 564(b)(1) of the Act, 21 U.S.C.section 360bbb-3(b)(1), unless the authorization is terminated  or revoked sooner.       Influenza A by PCR NEGATIVE NEGATIVE Final   Influenza B by PCR NEGATIVE NEGATIVE Final    Comment: (NOTE) The Xpert Xpress SARS-CoV-2/FLU/RSV plus assay is intended as an aid in the diagnosis of influenza from Nasopharyngeal swab specimens and should not be used as a sole basis for treatment. Nasal washings and aspirates are unacceptable for  Xpert Xpress SARS-CoV-2/FLU/RSV testing.  Fact Sheet for Patients: [place]  Fact Sheet for Healthcare Providers: BloggerCourse.com  This test is not yet approved or cleared by the [employer] FDA and has been authorized for detection and/or diagnosis of SARS-CoV-2 by FDA under an Emergency Use Authorization (EUA). This EUA will remain in effect (meaning this test can be used) for the duration of the COVID-19 declaration under Section 564(b)(1) of the Act, 21 U.S.C. section 360bbb-3(b)(1), unless the authorization is  terminated or revoked.  Performed at [facility], [address], [place], [place] [address]   Culture, blood (Routine X 2) w Reflex to ID Panel     Status: None (Preliminary result)   Collection Time: 05/07/22  9:25 PM   Specimen: BLOOD  Result Value Ref Range Status   Specimen Description BLOOD SITE NOT SPECIFIED  Final   Special Requests   Final    BOTTLES DRAWN AEROBIC AND ANAEROBIC Blood Culture adequate volume   Culture   Final    NO GROWTH 4 DAYS Performed at [facility], [address], [place], [place] [address]    Report Status PENDING  Incomplete  MRSA Next Gen by PCR, Nasal     Status: Abnormal   Collection Time: 05/07/22 10:22 PM   Specimen: Nasal Mucosa; Nasal Swab  Result Value Ref Range Status   MRSA by PCR Next Gen DETECTED (A) NOT DETECTED Final    Comment: RESULT CALLED TO, READ BACK BY AND VERIFIED WITH: J ALLEN,RN@0002  05/08/22 MK (NOTE) The GeneXpert MRSA Assay (FDA approved for NASAL specimens only), is one component of a comprehensive MRSA colonization surveillance program. It is not intended to diagnose MRSA infection nor to guide or monitor treatment for MRSA infections. Test performance is not FDA approved in patients less than 68 years old. Performed at [facility], [address] [place], [place] [address]   Urine Culture     Status: None    Collection Time: 05/07/22 11:39 PM   Specimen: Urine, Catheterized  Result Value Ref Range Status   Specimen Description URINE, CATHETERIZED  Final   Special Requests NONE  Final   Culture   Final    NO GROWTH Performed at [facility], [address] [place], [place] [address]    Report Status 05/09/2022 FINAL  Final  Culture, blood (Routine X 2) w Reflex to ID Panel     Status: None (Preliminary result)   Collection Time: 05/08/22 12:33 AM   Specimen: BLOOD  Result Value Ref Range Status   Specimen Description BLOOD LEFT ANTECUBITAL  Final   Special Requests   Final    BOTTLES DRAWN AEROBIC AND ANAEROBIC Blood Culture adequate volume   Culture   Final    NO GROWTH 3 DAYS Performed at [facility], [address] [place], [place] [address]    Report Status PENDING  Incomplete     Radiology Studies: ECHOCARDIOGRAM COMPLETE  Result Date: 05/10/2022    ECHOCARDIOGRAM REPORT   Patient Name:   Manuel Kramer Date of Exam: 05/10/2022 Medical Rec #:  [id number]        Height:       66.0 in Accession #:    [id number]       Weight:       158.5 lb Date of Birth:  September 28, 1956        BSA:          1.812 m Patient Age:    66 years         BP:           165/109 mmHg Patient Gender: M                HR:           73 bpm. Exam Location:  Inpatient Procedure: 2D Echo, Color Doppler, Cardiac Doppler and Strain Analysis Indications:    Stroke i63.9  History:  Patient has prior history of Echocardiogram examinations, most                 recent 11/18/2021. CHF; Risk Factors:Hypertension, Diabetes and                 Dyslipidemia.  Sonographer:    Raquel Sarna Senior RDCS Referring Phys: [id number] [place]  1. Left ventricular ejection fraction, by estimation, is 60 to 65%. The left ventricle has normal function. The left ventricle has no regional wall motion abnormalities. There is mild concentric left ventricular hypertrophy. Left ventricular diastolic parameters are consistent  with Grade I diastolic dysfunction (impaired relaxation).  2. Right ventricular systolic function is normal. The right ventricular size is normal. Tricuspid regurgitation signal is inadequate for assessing PA pressure.  3. The mitral valve is normal in structure. Mild mitral valve regurgitation. No evidence of mitral stenosis.  4. The aortic valve is tricuspid. There is moderate thickening of the aortic valve. Aortic valve regurgitation is mild. Aortic valve sclerosis/calcification is present, without any evidence of aortic stenosis. Aortic regurgitation PHT measures 713 msec.  5. The inferior vena cava is normal in size with greater than 50% respiratory variability, suggesting right atrial pressure of 3 mmHg. FINDINGS  Left Ventricle: Left ventricular ejection fraction, by estimation, is 60 to 65%. The left ventricle has normal function. The left ventricle has no regional wall motion abnormalities. Global longitudinal strain performed but not reported based on interpreter judgement due to suboptimal tracking. The left ventricular internal cavity size was normal in size. There is mild concentric left ventricular hypertrophy. Left ventricular diastolic parameters are consistent with Grade I diastolic dysfunction  (impaired relaxation). Normal left ventricular filling pressure. Right Ventricle: The right ventricular size is normal. No increase in right ventricular wall thickness. Right ventricular systolic function is normal. Tricuspid regurgitation signal is inadequate for assessing PA pressure. Left Atrium: Left atrial size was normal in size. Right Atrium: Right atrial size was normal in size. Pericardium: Trivial pericardial effusion is present. The pericardial effusion is circumferential. Mitral Valve: The mitral valve is normal in structure. Mild mitral valve regurgitation. No evidence of mitral valve stenosis. Tricuspid Valve: The tricuspid valve is normal in structure. Tricuspid valve regurgitation is not  demonstrated. No evidence of tricuspid stenosis. Aortic Valve: The aortic valve is tricuspid. There is moderate thickening of the aortic valve. Aortic valve regurgitation is mild. Aortic regurgitation PHT measures 713 msec. Aortic valve sclerosis/calcification is present, without any evidence of aortic  stenosis. Pulmonic Valve: The pulmonic valve was normal in structure. Pulmonic valve regurgitation is trivial. No evidence of pulmonic stenosis. Aorta: The aortic root is normal in size and structure. Venous: The inferior vena cava is normal in size with greater than 50% respiratory variability, suggesting right atrial pressure of 3 mmHg. IAS/Shunts: No atrial level shunt detected by color flow Doppler.  LEFT VENTRICLE PLAX 2D LVIDd:         3.60 cm   Diastology LVIDs:         2.40 cm   LV e' medial:    6.22 cm/s LV PW:         1.30 cm   LV E/e' medial:  8.3 LV IVS:        1.20 cm   LV e' lateral:   6.84 cm/s LVOT diam:     1.80 cm   LV E/e' lateral: 7.5 LV SV:         60 LV SV Index:   33  LVOT Area:     2.54 cm  RIGHT VENTRICLE RV S prime:     17.70 cm/s TAPSE (M-mode): 2.2 cm LEFT ATRIUM             Index        RIGHT ATRIUM           Index LA diam:        3.50 cm 1.93 cm/m   RA Area:     13.50 cm LA Vol (A2C):   52.7 ml 29.09 ml/m  RA Volume:   28.20 ml  15.57 ml/m LA Vol (A4C):   56.7 ml 31.30 ml/m LA Biplane Vol: 55.6 ml 30.69 ml/m  AORTIC VALVE LVOT Vmax:   142.00 cm/s LVOT Vmean:  94.200 cm/s LVOT VTI:    0.235 m AI PHT:      713 msec  AORTA Ao Root diam: 3.30 cm Ao Asc diam:  3.20 cm MITRAL VALVE MV Area (PHT): 3.81 cm    SHUNTS MV Decel Time: 199 msec    Systemic VTI:  0.24 m MV E velocity: 51.40 cm/s  Systemic Diam: 1.80 cm MV A velocity: 82.30 cm/s MV E/A ratio:  0.62 Fransico Him MD Electronically signed by Fransico Him MD Signature Date/Time: 05/10/2022/2:35:59 PM    Final     Scheduled Meds:  amLODipine  10 mg Oral Daily   aspirin  81 mg Oral Daily   atorvastatin  40 mg Oral Daily    carvedilol  25 mg Oral BID WC   Chlorhexidine Gluconate Cloth  6 each Topical Daily   cholecalciferol  1,000 Units Oral Daily   cloNIDine  0.3 mg Oral TID   clopidogrel  75 mg Oral Daily   feeding supplement (GLUCERNA SHAKE)  237 mL Oral BID BM   gabapentin  100 mg Oral QHS   heparin  5,000 Units Subcutaneous Q8H   hydrALAZINE  100 mg Oral Q8H   insulin aspart  0-20 Units Subcutaneous TID WC   insulin aspart  0-5 Units Subcutaneous QHS   insulin glargine-yfgn  10 Units Subcutaneous BID   isosorbide dinitrate  10 mg Oral TID   levETIRAcetam  500 mg Oral BID   loratadine  10 mg Oral Daily   losartan  100 mg Oral Daily   multivitamin with minerals  1 tablet Oral Daily   mupirocin ointment  1 Application Nasal BID   niacin  100 mg Oral BID   sertraline  50 mg Oral Daily   sodium bicarbonate  1,300 mg Oral BID   spironolactone  25 mg Oral Daily   torsemide  40 mg Oral Daily   vancomycin  125 mg Oral QID   Zinc Oxide   Topical TID   Continuous Infusions:  cefTRIAXone (ROCEPHIN)  IV Stopped (05/10/22 2328)     LOS: 4 days   Shelly Coss, MD [facility] P7/13/2023, 8:14 AM

## 2022-05-11 NOTE — Progress Notes (Signed)
AM CBG 46 Drank 4 oz grape juice Rechecked after 15 minutes  CBG 69 Drank 4 oz grape juice Rechecked after 15 minutes CBG 82

## 2022-05-11 NOTE — Progress Notes (Addendum)
Inpatient Diabetes Program Recommendations  AACE/ADA: New Consensus Statement on Inpatient Glycemic Control (2015)  Target Ranges:  Prepandial:   less than 140 mg/dL      Peak postprandial:   less than 180 mg/dL (1-2 hours)      Critically ill patients:  140 - 180 mg/dL   Lab Results  Component Value Date   GLUCAP 82 05/11/2022   HGBA1C 5.2 01/26/2022    Review of Glycemic Control  Latest Reference Range & Units 05/10/22 19:34 05/10/22 22:57 05/11/22 03:22 05/11/22 07:26 05/11/22 07:42 05/11/22 07:57  Glucose-Capillary 70 - 99 mg/dL 161 (H) 096 (H) 74 46 (L) 69 (L) 82  Diabetes history: DM 2 Outpatient Diabetes medications:  Lantus 10 units q HS Current orders for Inpatient glycemic control:  Lantus 10 units bid Novolog resistant tid with meals and HS  Inpatient Diabetes Program Recommendations:   Blood sugar low this morning. Consider reducing Semglee back to 12 units daily (instead of bid).  Also post-prandial blood sugars elevated on 7/12- Consider adding Novolog meal coverage 3 units tid with meals (hold if patient eats less than 50% or NPO).  Consider reducing Novolog correction to sensitive tid with meals and HS.   Thanks,  Beryl Meager, RN, BC-ADM Inpatient Diabetes Coordinator Pager [phone]  (8a-5p)    Addendum 1600: Spoke with patient's son who is concerned that patient was "not" getting insulin prior to admit and concerned that blood sugar was so high on admit.  We discussed different causes of high blood sugars such as infection, other metabolic processes, or omission of insulin.  Encouraged son to have dialogue with facility regarding DM management, checking blood sugars regularly and administration of insulin daily.  May also benefit from seeing endocrinologist as well to help with DM management.  Son appreciative of information and plans to f/u with facility. Explained that insulin doses were adjusted today and likely will be adjusted further during  hospitalization.

## 2022-05-11 NOTE — NC FL2 (Signed)
[place] LEVEL OF CARE SCREENING TOOL     IDENTIFICATION  Patient Name: Manuel Kramer Birthdate: [birth year] Sex: male Admission Date (Current Location): 05/07/2022  [facility] and [place] Number:  Herbalist and Address:  [facility] [place], [place] [address] [place], [place] [address]      Provider Number: [id number]  Attending Physician Name and Address:  Shelly Coss, MD  Relative Name and Phone Number:       Current Level of Care: Hospital Recommended Level of Care: [place] Prior Approval Number:    Date Approved/Denied:   PASRR Number: [id number]  Discharge Plan: SNF    Current Diagnoses: Patient Active Problem List   Diagnosis Date Noted   Acute ischemic stroke ([place]) > small acute to subacute pontine infarct seen on MRI 05/10/2022   Seizure disorder ([place]) [id number]   Acute metabolic encephalopathy 0000000   Pressure injury of skin 05/08/2022   Aspiration pneumonia ([place]) 05/08/2022   C. difficile colitis 01/26/2022   Acute renal failure superimposed on stage 3b chronic kidney disease ([place]) 01/25/2022   Bilateral lower extremity edema 01/25/2022   Type 2 diabetes mellitus with complication, with long-term current use of insulin ([place]) 01/25/2022   Depression with anxiety 01/25/2022   Physical deconditioning 01/25/2022   Left hip pain 01/25/2022   Anemia of chronic disease 01/25/2022   Chronic diastolic CHF (congestive heart failure) ([place]) 01/25/2022   Cerebral embolism with cerebral infarction 11/17/2021   Protein-calorie malnutrition, severe 11/16/2021   AKI (acute kidney injury) ([place])    Hyperglycemia    Hypertensive emergency without congestive heart failure    Sepsis ([place])    Hyperlipidemia 05/22/2016   Diabetic neuropathy ([place]) 05/22/2016   Other specified diabetes mellitus without complications ([place]) [id number]   Hypertensive emergency 10/02/2014   Smoking 10/02/2014   Family history of  prostate cancer 10/02/2014   Renal insufficiency 10/02/2014    Orientation RESPIRATION BLADDER Height & Weight     Self, Place  Normal Incontinent Weight: 158 lb 8.2 oz (71.9 kg) Height:  5\' 6"  (167.6 cm)  BEHAVIORAL SYMPTOMS/MOOD NEUROLOGICAL BOWEL NUTRITION STATUS      Incontinent Diet (Please see dc summary)  AMBULATORY STATUS COMMUNICATION OF NEEDS Skin   Extensive Assist Verbally PU Stage and Appropriate Care                       Personal Care Assistance Level of Assistance  Bathing, Feeding, Dressing Bathing Assistance: Maximum assistance Feeding assistance: Limited assistance Dressing Assistance: Maximum assistance     Functional Limitations Info  Sight, Hearing, Speech Sight Info: Impaired Hearing Info: Adequate Speech Info: Adequate    SPECIAL CARE FACTORS FREQUENCY  PT (By licensed PT), OT (By licensed OT)                    Contractures Contractures Info: Not present    Additional Factors Info  Code Status Code Status Info: FULL CODE             Current Medications (05/11/2022):  This is the current hospital active medication list Current Facility-Administered Medications  Medication Dose Route Frequency Provider Last Rate Last Admin   acetaminophen (TYLENOL) tablet 650 mg  650 mg Per Tube Q4H PRN Julian Hy, DO       amLODipine (NORVASC) tablet 10 mg  10 mg Oral Daily Elsie Amis, RPH   10 mg at 05/10/22 0950   aspirin chewable  tablet 81 mg  81 mg Oral Daily Max Fickle B, MD   81 mg at 05/10/22 0948   atorvastatin (LIPITOR) tablet 40 mg  40 mg Oral Daily Rosanna Randy, RPH   40 mg at 05/10/22 0950   carvedilol (COREG) tablet 25 mg  25 mg Oral BID WC Rosanna Randy, RPH   25 mg at 05/10/22 1606   cefTRIAXone (ROCEPHIN) 2 g in sodium chloride 0.9 % 100 mL IVPB  2 g Intravenous Q24H Lupita Leash, MD   Stopped at 05/10/22 2328   Chlorhexidine Gluconate Cloth 2 % PADS 6 each  6 each Topical Daily Lupita Leash, MD   6 each at 05/09/22 1949   cholecalciferol (VITAMIN D3) tablet 1,000 Units  1,000 Units Oral Daily Rosanna Randy, RPH   1,000 Units at 05/10/22 1607   cloNIDine (CATAPRES) tablet 0.3 mg  0.3 mg Oral TID Rosanna Randy, RPH   0.3 mg at 05/10/22 2251   clopidogrel (PLAVIX) tablet 75 mg  75 mg Oral Daily Rosanna Randy, RPH   75 mg at 05/10/22 0949   docusate sodium (COLACE) capsule 100 mg  100 mg Oral BID PRN Rosanna Randy, RPH       feeding supplement (GLUCERNA SHAKE) (GLUCERNA SHAKE) liquid 237 mL  237 mL Oral BID BM Max Fickle B, MD       gabapentin (NEURONTIN) capsule 100 mg  100 mg Oral QHS Rosanna Randy, RPH   100 mg at 05/10/22 2252   heparin injection 5,000 Units  5,000 Units Subcutaneous Q8H Steffanie Dunn, DO   5,000 Units at 05/11/22 3710   hydrALAZINE (APRESOLINE) tablet 100 mg  100 mg Oral Q8H Rosanna Randy, RPH   100 mg at 05/11/22 0612   insulin aspart (novoLOG) injection 0-20 Units  0-20 Units Subcutaneous TID WC Max Fickle B, MD   11 Units at 05/10/22 1753   insulin aspart (novoLOG) injection 0-5 Units  0-5 Units Subcutaneous QHS Max Fickle B, MD       insulin glargine-yfgn (SEMGLEE) injection 10 Units  10 Units Subcutaneous BID Max Fickle B, MD   10 Units at 05/10/22 2251   ipratropium-albuterol (DUONEB) 0.5-2.5 (3) MG/3ML nebulizer solution 3 mL  3 mL Nebulization Q6H PRN Karie Fetch P, DO       isosorbide dinitrate (ISORDIL) tablet 10 mg  10 mg Oral TID Rosanna Randy, RPH   10 mg at 05/10/22 2252   levETIRAcetam (KEPPRA) tablet 500 mg  500 mg Oral BID Rosanna Randy, RPH   500 mg at 05/10/22 2251   loratadine (CLARITIN) tablet 10 mg  10 mg Oral Daily Rosanna Randy, RPH   10 mg at 05/10/22 0949   losartan (COZAAR) tablet 100 mg  100 mg Oral Daily Rosanna Randy, RPH   100 mg at 05/10/22 6269   multivitamin with minerals tablet 1 tablet  1 tablet Oral Daily Rosanna Randy, RPH   1  tablet at 05/10/22 4854   mupirocin ointment (BACTROBAN) 2 % 1 Application  1 Application Nasal BID Steffanie Dunn, DO   1 Application at 05/10/22 2251   niacin tablet 100 mg  100 mg Oral BID Rosanna Randy, RPH   100 mg at 05/10/22 2252   polyethylene glycol (MIRALAX / GLYCOLAX) packet 17 g  17 g Oral Daily PRN Rosanna Randy, RPH       sertraline (ZOLOFT) tablet 50 mg  50  mg Oral Daily Rosanna Randy, [place]   50 mg at 05/10/22 0950   sodium bicarbonate tablet 1,300 mg  1,300 mg Oral BID Rosanna Randy, RPH   1,300 mg at 05/10/22 2251   spironolactone (ALDACTONE) tablet 25 mg  25 mg Oral Daily Rosanna Randy, RPH   25 mg at 05/10/22 0948   torsemide (DEMADEX) tablet 40 mg  40 mg Oral Daily Rosanna Randy, RPH   40 mg at 05/10/22 0949   vancomycin (VANCOCIN) 50 mg/mL oral solution SOLN 125 mg  125 mg Oral QID Rosanna Randy, RPH   125 mg at 05/11/22 0009   Zinc Oxide (TRIPLE PASTE) 12.8 % ointment   Topical TID Lupita Leash, MD   Given at 05/10/22 2252     Discharge Medications: Please see discharge summary for a list of discharge medications.  Relevant Imaging Results:  Relevant Lab Results:   Additional Information SS#: [id number]  Deatra Robinson, [place]

## 2022-05-11 NOTE — Progress Notes (Signed)
OT Cancellation Note  Patient Details Name: Manuel Kramer MRN: [id number] DOB: Apr 26, 1956   Cancelled Treatment:    Reason Eval/Treat Not Completed: Other (comment) Pt is Medicare and current D/C plan is back to his SNF. No apparent immediate acute care OT needs, therefore will defer OT to SNF. If OT eval is needed please call Acute Rehab Dept. at [phone] or text page OT at [phone].     [name],HILLARY 05/11/2022, 2:04 PM [name], OT/L   Acute OT Clinical Specialist Acute Rehabilitation Services Pager [phone] Office [phone]

## 2022-05-12 DIAGNOSIS — E1101 Type 2 diabetes mellitus with hyperosmolarity with coma: Secondary | ICD-10-CM | POA: Diagnosis not present

## 2022-05-12 LAB — CULTURE, BLOOD (ROUTINE X 2)
Culture: NO GROWTH
Special Requests: ADEQUATE

## 2022-05-12 LAB — BASIC METABOLIC PANEL
Anion gap: 7 (ref 5–15)
BUN: 29 mg/dL — ABNORMAL HIGH (ref 8–23)
CO2: 25 mmol/L (ref 22–32)
Calcium: 7.8 mg/dL — ABNORMAL LOW (ref 8.9–10.3)
Chloride: 104 mmol/L (ref 98–111)
Creatinine, Ser: 2.05 mg/dL — ABNORMAL HIGH (ref 0.61–1.24)
GFR, Estimated: 35 mL/min — ABNORMAL LOW (ref >60)
Glucose, Bld: 149 mg/dL — ABNORMAL HIGH (ref 70–99)
Potassium: 4.4 mmol/L (ref 3.5–5.1)
Sodium: 136 mmol/L (ref 135–145)

## 2022-05-12 LAB — GLUCOSE, CAPILLARY: Glucose-Capillary: 212 mg/dL — ABNORMAL HIGH (ref 70–99)

## 2022-05-12 MED ORDER — ISOSORBIDE DINITRATE 30 MG PO TABS: 30.0000 mg | ORAL_TABLET | Freq: Three times a day (TID) | ORAL | Status: DC

## 2022-05-12 MED ORDER — VANCOMYCIN HCL 125 MG PO CAPS
125.0000 mg | ORAL_CAPSULE | Freq: Three times a day (TID) | ORAL | 0 refills | Status: AC
Start: 2022-05-12 — End: 2022-05-19

## 2022-05-12 MED ORDER — TORSEMIDE 40 MG PO TABS: 40.0000 mg | ORAL_TABLET | Freq: Every day | ORAL | Status: DC

## 2022-05-12 MED ORDER — INSULIN ASPART 100 UNIT/ML IJ SOLN: 3.0000 [IU] | Freq: Three times a day (TID) | INTRAMUSCULAR | 11 refills | Status: DC

## 2022-05-12 MED ORDER — LEVETIRACETAM 500 MG PO TABS: 500.0000 mg | ORAL_TABLET | Freq: Two times a day (BID) | ORAL | Status: AC

## 2022-05-12 MED ORDER — LOSARTAN POTASSIUM 100 MG PO TABS: 100.0000 mg | ORAL_TABLET | Freq: Every day | ORAL | Status: DC

## 2022-05-12 MED ORDER — SPIRONOLACTONE 25 MG PO TABS: 25.0000 mg | ORAL_TABLET | Freq: Every day | ORAL | Status: DC

## 2022-05-12 MED ORDER — VANCOMYCIN HCL 125 MG PO CAPS
125.0000 mg | ORAL_CAPSULE | Freq: Three times a day (TID) | ORAL | Status: DC
  Administered 2022-05-12 (×2): 125 mg via ORAL
  Filled 2022-05-12 (×4): qty 1

## 2022-05-12 MED ORDER — INSULIN GLARGINE 100 UNIT/ML SOLOSTAR PEN: 12.0000 [IU] | PEN_INJECTOR | Freq: Every day | SUBCUTANEOUS | 11 refills | Status: DC

## 2022-05-12 NOTE — Inpatient Diabetes Management (Signed)
Inpatient Diabetes Program Recommendations  AACE/ADA: New Consensus Statement on Inpatient Glycemic Control (2015)  Target Ranges:  Prepandial:   less than 140 mg/dL      Peak postprandial:   less than 180 mg/dL (1-2 hours)      Critically ill patients:  140 - 180 mg/dL   Lab Results  Component Value Date   GLUCAP 212 (H) 05/12/2022   HGBA1C 5.2 01/26/2022    Latest Reference Range & Units 05/11/22 07:42 05/11/22 07:57 05/11/22 11:57 05/11/22 15:38 05/11/22 21:16 05/12/22 06:27  Glucose-Capillary 70 - 99 mg/dL 69 (L) 82 161 (H) 096 (H) 79 212 (H)  (L): Data is abnormally low (H): Data is abnormally high Review of Glycemic Control  Diabetes history: type 2 Outpatient Diabetes medications: Lantus 10 units at HS Current orders for Inpatient glycemic control: Semglee 12 units daily, Novolog RESISTANT correction scale TID, Novolog 0-5 units HS scale, Novolog 3 units TID  Inpatient Diabetes Program Recommendations:   Received diabetes coordinator consult. Noted that diabetes coordinator spoke with patient's son yesterday on the phone. She noted their conversation in the progress note on 7/13.   Will continue to monitor blood sugars while in the hospital.  Smith Mince RN BSN CDE Diabetes Coordinator Pager: [phone]  8am-5pm

## 2022-05-12 NOTE — Progress Notes (Signed)
Pt wheeled off unit by [facility]. Report given to [facility] nurse at [facility]

## 2022-05-12 NOTE — TOC Transition Note (Signed)
Transition of Care [facility]) - CM/SW Discharge Note   Patient Details  Name: Manuel Kramer MRN: [id number] Date of Birth: [birth year]  [facility] CM/SW Contact:  Baldemar Lenis, LCSW Phone Number: 05/12/2022, 11:12 AM   Clinical Narrative:   CSW confirmed with MD that patient is ready to return to SNF today. CSW updated [name] and sent discharge information. CSW notified son, [name], via phone, left a voicemail for daughter. Transport scheduled with [facility] for next available.  Nurse to call report to [phone], Room 218.    Final next level of care: Skilled Nursing Facility Barriers to Discharge: Barriers Resolved   Patient Goals and CMS Choice        Discharge Placement              Patient chooses bed at: [facility] Patient to be transferred to facility by: [facility] Name of family member notified: [name] Patient and family notified of of transfer: 05/12/22  Discharge Plan and Services                                     Social Determinants of Health (SDOH) Interventions     Readmission Risk Interventions     No data to display

## 2022-05-12 NOTE — Discharge Summary (Signed)
Physician Discharge Summary  Manuel Kramer [id number] DOB: June 28, 1956 DOA: 05/07/2022  PCP: Vonna Drafts, FNP  Admit date: 05/07/2022 Discharge date: 05/12/2022  Admitted From: SNF Disposition:  SNF  Discharge Condition:Stable CODE STATUS:FULL Diet recommendation: Heart Healthy  Brief/Interim Summary:   Patient is a 66 year old male with history of diabetes type 2, hypertension, CKD stage IIIb, hyperlipidemia, diastolic congestive heart failure who was brought here from nursing facility after he was found to be unresponsive, there was concern for seizure activity.  On presentation, he was hypoglycemic.  Patient had to be intubated for airway protection.  MRI of the brain showed acute/subacute infarcts in the left pons, punctate infarct in the deep white matter of bilateral frontal lobes.  Neurology were following.  Extubated on 7/11, transferred to [facility] service on 7/13.  He is hemodynamically stable to be discharged back to skilled nursing facility today.    Following problems were addressed during his hospitalization:  Acute metabolic encephalopathy: Suspected to be from severe hyperglycemia, seizure.  Mental status  improved.  MRI showed acute/subacute infarct as above.  Neurology were following.  S   Seizure disorder: Likely secondary to hyperglycemia and severe hypertension.  Neurology were following.  On Keppra 500 mg twice a day.   Acute ischemic stroke: MRI of the brain showed  acute/subacute infarcts in the left pons, punctate infarct in the deep white matter of bilateral frontal lobes.  Neurology were following.  Continue aspirin,palvix. Echo showed EF of 60 to [id number], grade 1 diastolic dysfunction,  No shunt.  We recommend to follow-up with neurology as an outpatient.   AKI on CKD stage IIIb: Currently kidney function at baseline.  Monitor.  On torsemide.  Continue sodium bicarb tablets   Acute hypoxic respiratory failure: Had to be intubated on presentation for airway  protection.  Extubated.  Continue aspiration precaution.Hospital course also remarkable for finding of aspiration pneumonia, on ceftriaxone, completed course. Currently on room air   Type 2 diabetes: Continue current insulin regimen, monitor blood sugar.  Very erratic blood sugars   Normocytic anemia: Likely associated  with chronic kidney disease.  Continue to monitor   C. difficile colitis: Was undergoing treatment for C. difficile colitis at skilled nursing facility.  He just finished ceftriaxone on 7/13.  Plan to continue 7 days of vancomycin from today.   Hypertensive emergency: .  On Coreg, amlodipine, clonidine, hydralazine,imdur, losartan, spironolactone.continue to monitor the blood pressure and titrate the medications   Debility/deconditioning: Patient is from nursing facility, expect discharge to the same facility .TOC following     Discharge Diagnoses:  Active Problems:   Seizure disorder (HCC)   Aspiration pneumonia (HCC)   Acute renal failure superimposed on stage 3b chronic kidney disease (HCC)   C. difficile colitis   Type 2 diabetes mellitus with complication, with long-term current use of insulin (HCC)   Hypertensive emergency   Hyperlipidemia   Anemia of chronic disease   Chronic diastolic CHF (congestive heart failure) (HCC)   Protein-calorie malnutrition, severe   Acute metabolic encephalopathy   Pressure injury of skin   Acute ischemic stroke (HCC) > small acute to subacute pontine infarct seen on MRI    Discharge Instructions  Discharge Instructions     Diet - low sodium heart healthy   Complete by: As directed    Discharge instructions   Complete by: As directed    1)Please take prescribed medications as instructed 2)Monitor your blood sugars and blood pressure 3)Follow up with [facility] neurology in 4  weeks. Number and number the provider has been addressed. 4)Do a CBC and BMP test in a week   Increase activity slowly   Complete by: As directed     No wound care   Complete by: As directed       Allergies as of 05/12/2022       Reactions   Codeine Other (See Comments)   On MAR, unknown reaction   Iodinated Contrast Media Other (See Comments)   On MAR, unknown reaction   Pentazocine Other (See Comments)   On MAR, unknown reaction   Statins Other (See Comments)   On MAR, unknown reaction        Medication List     STOP taking these medications    isosorbide mononitrate 30 MG 24 hr tablet Commonly known as: IMDUR       TAKE these medications    acetaminophen 500 MG tablet Commonly known as: TYLENOL Take 1 tablet (500 mg total) by mouth every 6 (six) hours as needed.   amLODipine 10 MG tablet Commonly known as: NORVASC Take 1 tablet (10 mg total) by mouth daily.   aspirin EC 81 MG tablet Take 81 mg by mouth daily. Swallow whole.   atorvastatin 40 MG tablet Commonly known as: LIPITOR Take 1 tablet (40 mg total) by mouth daily.   carvedilol 25 MG tablet Commonly known as: COREG Take 1 tablet (25 mg total) by mouth 2 (two) times daily with a meal.   cholecalciferol 25 MCG (1000 UNIT) tablet Commonly known as: VITAMIN D3 Take 1,000 Units by mouth daily.   Cinnamon 500 MG capsule Take 500 mg by mouth daily.   cloNIDine 0.3 MG tablet Commonly known as: CATAPRES Take 1 tablet (0.3 mg total) by mouth 3 (three) times daily.   clopidogrel 75 MG tablet Commonly known as: PLAVIX Take 75 mg by mouth daily.   Dermacloud Oint Apply 1 Application topically in the morning, at noon, and at bedtime.   Desitin 40 % Pste Generic drug: Zinc Oxide Apply 1 Application topically in the morning and at bedtime.   gabapentin 100 MG capsule Commonly known as: NEURONTIN Take 100 mg by mouth at bedtime.   Garlic [id number] MG Caps Take 1 capsule by mouth daily.   hydrALAZINE 100 MG tablet Commonly known as: APRESOLINE Take 1 tablet (100 mg total) by mouth every 8 (eight) hours.   insulin aspart 100 UNIT/ML  injection Commonly known as: novoLOG Inject 3 Units into the skin 3 (three) times daily with meals. If eats more than 50% of meals   insulin glargine 100 UNIT/ML Solostar Pen Commonly known as: LANTUS Inject 12 Units into the skin at bedtime. What changed: how much to take   ipratropium 0.06 % nasal spray Commonly known as: ATROVENT Place 2 sprays into both nostrils 2 (two) times daily.   isosorbide dinitrate 30 MG tablet Commonly known as: ISORDIL Take 1 tablet (30 mg total) by mouth 3 (three) times daily.   levETIRAcetam 500 MG tablet Commonly known as: KEPPRA Take 1 tablet (500 mg total) by mouth 2 (two) times daily.   loratadine 10 MG tablet Commonly known as: CLARITIN Take 10 mg by mouth daily.   losartan 100 MG tablet Commonly known as: COZAAR Take 1 tablet (100 mg total) by mouth daily. What changed:  medication strength how much to take   multivitamin with minerals Tabs tablet Take 1 tablet by mouth daily.   niacin 100 MG tablet Take 100 mg by mouth 2 (  two) times daily.   sertraline 50 MG tablet Commonly known as: ZOLOFT Take 50 mg by mouth daily.   sodium bicarbonate 650 MG tablet Take 1,300 mg by mouth 2 (two) times daily.   spironolactone 25 MG tablet Commonly known as: ALDACTONE Take 1 tablet (25 mg total) by mouth daily. Start taking on: May 13, 2022   Torsemide 40 MG Tabs Take 40 mg by mouth daily. What changed:  medication strength how much to take   vancomycin 125 MG capsule Commonly known as: VANCOCIN Take 1 capsule (125 mg total) by mouth in the morning, at noon, and at bedtime for 7 days. What changed: when to take this        Contact information for follow-up providers     [facility] Follow up in 4 week(s).   Specialty: Neurology Contact information: [address] [place] [address] [phone]             Contact information for after-discharge care     Destination      [facility] .   Service: Skilled Nursing Contact information: [address] [place] [address] [phone]                    Allergies  Allergen Reactions   Codeine Other (See Comments)    On MAR, unknown reaction   Iodinated Contrast Media Other (See Comments)    On MAR, unknown reaction   Pentazocine Other (See Comments)    On MAR, unknown reaction   Statins Other (See Comments)    On MAR, unknown reaction    Consultations: Neurology, PCCM   Procedures/Studies: ECHOCARDIOGRAM COMPLETE  Result Date: 05/10/2022    ECHOCARDIOGRAM REPORT   Patient Name:   JADDEN YIM Date of Exam: 05/10/2022 Medical Rec #:  [id number]        Height:       66.0 in Accession #:    [id number]       Weight:       158.5 lb Date of Birth:  Oct 13, 1956        BSA:          1.812 m Patient Age:    66 years         BP:           165/109 mmHg Patient Gender: M                HR:           73 bpm. Exam Location:  Inpatient Procedure: 2D Echo, Color Doppler, Cardiac Doppler and Strain Analysis Indications:    Stroke i63.9  History:        Patient has prior history of Echocardiogram examinations, most                 recent 11/18/2021. CHF; Risk Factors:Hypertension, Diabetes and                 Dyslipidemia.  Sonographer:    Irving Burton Senior RDCS Referring Phys: [id number] Kristopher Oppenheim YADAV IMPRESSIONS  1. Left ventricular ejection fraction, by estimation, is 60 to 65%. The left ventricle has normal function. The left ventricle has no regional wall motion abnormalities. There is mild concentric left ventricular hypertrophy. Left ventricular diastolic parameters are consistent with Grade I diastolic dysfunction (impaired relaxation).  2. Right ventricular systolic function is normal. The right ventricular size is normal. Tricuspid regurgitation signal is inadequate for assessing PA  pressure.  3. The mitral valve is normal in structure. Mild mitral valve  regurgitation. No evidence of mitral stenosis.  4. The aortic valve is tricuspid. There is moderate thickening of the aortic valve. Aortic valve regurgitation is mild. Aortic valve sclerosis/calcification is present, without any evidence of aortic stenosis. Aortic regurgitation PHT measures 713 msec.  5. The inferior vena cava is normal in size with greater than 50% respiratory variability, suggesting right atrial pressure of 3 mmHg. FINDINGS  Left Ventricle: Left ventricular ejection fraction, by estimation, is 60 to 65%. The left ventricle has normal function. The left ventricle has no regional wall motion abnormalities. Global longitudinal strain performed but not reported based on interpreter judgement due to suboptimal tracking. The left ventricular internal cavity size was normal in size. There is mild concentric left ventricular hypertrophy. Left ventricular diastolic parameters are consistent with Grade I diastolic dysfunction  (impaired relaxation). Normal left ventricular filling pressure. Right Ventricle: The right ventricular size is normal. No increase in right ventricular wall thickness. Right ventricular systolic function is normal. Tricuspid regurgitation signal is inadequate for assessing PA pressure. Left Atrium: Left atrial size was normal in size. Right Atrium: Right atrial size was normal in size. Pericardium: Trivial pericardial effusion is present. The pericardial effusion is circumferential. Mitral Valve: The mitral valve is normal in structure. Mild mitral valve regurgitation. No evidence of mitral valve stenosis. Tricuspid Valve: The tricuspid valve is normal in structure. Tricuspid valve regurgitation is not demonstrated. No evidence of tricuspid stenosis. Aortic Valve: The aortic valve is tricuspid. There is moderate thickening of the aortic valve. Aortic valve regurgitation is mild. Aortic regurgitation PHT measures 713 msec. Aortic valve sclerosis/calcification is present, without any  evidence of aortic  stenosis. Pulmonic Valve: The pulmonic valve was normal in structure. Pulmonic valve regurgitation is trivial. No evidence of pulmonic stenosis. Aorta: The aortic root is normal in size and structure. Venous: The inferior vena cava is normal in size with greater than 50% respiratory variability, suggesting right atrial pressure of 3 mmHg. IAS/Shunts: No atrial level shunt detected by color flow Doppler.  LEFT VENTRICLE PLAX 2D LVIDd:         3.60 cm   Diastology LVIDs:         2.40 cm   LV e' medial:    6.22 cm/s LV PW:         1.30 cm   LV E/e' medial:  8.3 LV IVS:        1.20 cm   LV e' lateral:   6.84 cm/s LVOT diam:     1.80 cm   LV E/e' lateral: 7.5 LV SV:         60 LV SV Index:   33 LVOT Area:     2.54 cm  RIGHT VENTRICLE RV S prime:     17.70 cm/s TAPSE (M-mode): 2.2 cm LEFT ATRIUM             Index        RIGHT ATRIUM           Index LA diam:        3.50 cm 1.93 cm/m   RA Area:     13.50 cm LA Vol (A2C):   52.7 ml 29.09 ml/m  RA Volume:   28.20 ml  15.57 ml/m LA Vol (A4C):   56.7 ml 31.30 ml/m LA Biplane Vol: 55.6 ml 30.69 ml/m  AORTIC VALVE LVOT Vmax:   142.00 cm/s LVOT Vmean:  94.200 cm/s LVOT  VTI:    0.235 m AI PHT:      713 msec  AORTA Ao Root diam: 3.30 cm Ao Asc diam:  3.20 cm MITRAL VALVE MV Area (PHT): 3.81 cm    SHUNTS MV Decel Time: 199 msec    Systemic VTI:  0.24 m MV E velocity: 51.40 cm/s  Systemic Diam: 1.80 cm MV A velocity: 82.30 cm/s MV E/A ratio:  0.62 Fransico Him MD Electronically signed by Fransico Him MD Signature Date/Time: 05/10/2022/2:35:59 PM    Final    MR BRAIN WO CONTRAST  Result Date: 05/08/2022 CLINICAL DATA:  Seizure, new-onset; no history of trauma. EXAM: MRI HEAD WITHOUT CONTRAST TECHNIQUE: Multiplanar, multiecho pulse sequences of the brain and surrounding structures were obtained without intravenous contrast. COMPARISON:  Head CT May 07, 2022; MRI of the brain December 02, 2021. FINDINGS: Brain: Foci of restricted diffusion on the left side  of the pons and right forceps major, consistent with acute/subacute infarcts. Tiny foci increased signal on DWI without corresponding low ADC in the white matter of the bilateral frontal lobes may represent small subacute infarcts. No acute hemorrhage, hydrocephalus, extra-axial collection or mass lesion. Scattered and confluent foci of T2 hyperintensity are seen within white matter of the cerebral hemispheres and within the pons, nonspecific, most likely related to chronic small vessel ischemia, similar to prior. Remote lacunar infarcts in the bilateral centrum semiovale and corona radiata, basal ganglia, thalami and pons. Numerous foci of susceptibility artifact scattered throughout the supratentorial and infratentorial brain with prominent foci in the bilateral thalami and basal ganglia, more typical of uncontrolled hypertension rather than amyloid angiopathy. Mesial temporal lobes are symmetric and with normal signal characteristics. Vascular: Normal flow voids. Skull and upper cervical spine: Normal marrow signal. Sinuses/Orbits: Negative. Other: Bilateral mastoid effusion, left greater than right. IMPRESSION: 1. Small foci of acute/subacute infarcts within the left side of the pons and right forceps major. 2. Likely punctate subacute infarcts in the deep white matter of the bilateral frontal lobes. 3. Extensive chronic microvascular ischemic changes of white matter with multiple associated microhemorrhages, similar to prior. 4. Bilateral mastoid effusion, greater on the left. Electronically Signed   By: Pedro Earls M.D.   On: 05/08/2022 18:17   CT CHEST WO CONTRAST  Result Date: 05/08/2022 CLINICAL DATA:  Respiratory illness with nondiagnostic x-ray and mediastinal widening EXAM: CT CHEST WITHOUT CONTRAST TECHNIQUE: Multidetector CT imaging of the chest was performed following the standard protocol without IV contrast. RADIATION DOSE REDUCTION: This exam was performed according to the  departmental dose-optimization program which includes automated exposure control, adjustment of the mA and/or kV according to patient size and/or use of iterative reconstruction technique. COMPARISON:  Radiography from yesterday FINDINGS: Cardiovascular: Cardiomegaly. No pericardial effusion. Mild atheromatous calcification. No acute vascular finding without contrast. Mediastinum/Nodes: No hematoma or pneumomediastinum. The endotracheal tube traverses the esophagus and reaches the stomach. Lungs/Pleura: The enteric tube tip is at the level of the trachea. Dependent pulmonary opacity with streaky density and volume loss. Some ground-glass consolidative opacity may be superimposed at the right lower lobe. No interlobular septal thickening or pleural fluid. No air leak. Upper Abdomen: No acute finding Musculoskeletal: Spondylosis. IMPRESSION: 1. Mediastinal widening from cardiomegaly and aortic tortuosity. No aortic aneurysm or mediastinal hematoma. 2. Dependent atelectasis. There may be superimposed airspace disease in the right lower lobe. Electronically Signed   By: Jorje Guild M.D.   On: 05/08/2022 04:37   DG Chest Portable 1 View  Result Date: 05/07/2022 CLINICAL  DATA:  ET and OG placement. EXAM: PORTABLE CHEST 1 VIEW COMPARISON:  Abdominal series 02/20/2022. FINDINGS: Endotracheal tube tip is 3 cm above the carina. Enteric tube extends below the diaphragm. Distal tip is not included on the image. There are minimal right upper lobe patchy opacities. The lungs are otherwise clear. The aorta is ectatic in the heart is enlarged, unchanged. No pleural effusion or pneumothorax. No acute fractures. IMPRESSION: 1. Endotracheal tube and orogastric tube appear in adequate position, although the distal tip of the OG tube is not included on the image. 2. Focal right upper lobe airspace disease. 3. Cardiomegaly. Electronically Signed   By: Ronney Asters M.D.   On: 05/07/2022 20:59   CT HEAD WO CONTRAST  (5MM)  Result Date: 05/07/2022 CLINICAL DATA:  Seizure, new-onset, no history of trauma EXAM: CT HEAD WITHOUT CONTRAST TECHNIQUE: Contiguous axial images were obtained from the base of the skull through the vertex without intravenous contrast. RADIATION DOSE REDUCTION: This exam was performed according to the departmental dose-optimization program which includes automated exposure control, adjustment of the mA and/or kV according to patient size and/or use of iterative reconstruction technique. COMPARISON:  01/25/2022 FINDINGS: Brain: There is atrophy and chronic small vessel disease changes. No acute intracranial abnormality. Specifically, no hemorrhage, hydrocephalus, mass lesion, acute infarction, or significant intracranial injury. Vascular: No hyperdense vessel or unexpected calcification. Skull: No acute calvarial abnormality. Sinuses/Orbits: No acute findings Other: None IMPRESSION: Atrophy, chronic microvascular disease. No acute intracranial abnormality. Electronically Signed   By: Rolm Baptise M.D.   On: 05/07/2022 20:11      Subjective: Patient seen and examined at the bedside this morning.  Hemodynamically stable for discharge.  Called his daughter and discussed about the discharge plan  Discharge Exam: Vitals:   05/12/22 0323 05/12/22 0723  BP: 135/87 (!) 152/79  Pulse:  67  Resp: 16 16  Temp: 97.8 F (36.6 C) 98.2 F (36.8 C)  SpO2:  95%   Vitals:   05/11/22 2323 05/12/22 0323 05/12/22 0500 05/12/22 0723  BP: (!) 136/97 135/87  (!) 152/79  Pulse:    67  Resp: 15 16  16   Temp: 98 F (36.7 C) 97.8 F (36.6 C)  98.2 F (36.8 C)  TempSrc: Oral Axillary  Oral  SpO2:    95%  Weight:   75.8 kg   Height:        General: Pt is alert, awake, not in acute distress Cardiovascular: RRR, S1/S2 +, no rubs, no gallops Respiratory: CTA bilaterally, no wheezing, no rhonchi Abdominal: Soft, NT, ND, bowel sounds + Extremities: no edema, no cyanosis    The results of significant  diagnostics from this hospitalization (including imaging, microbiology, ancillary and laboratory) are listed below for reference.     Microbiology: Recent Results (from the past 240 hour(s))  Resp Panel by RT-PCR (Flu A&B, Covid) Anterior Nasal Swab     Status: None   Collection Time: 05/07/22  8:49 PM   Specimen: Anterior Nasal Swab  Result Value Ref Range Status   SARS Coronavirus 2 by RT PCR NEGATIVE NEGATIVE Final    Comment: (NOTE) SARS-CoV-2 target nucleic acids are NOT DETECTED.  The SARS-CoV-2 RNA is generally detectable in upper respiratory specimens during the acute phase of infection. The lowest concentration of SARS-CoV-2 viral copies this assay can detect is 138 copies/mL. A negative result does not preclude SARS-Cov-2 infection and should not be used as the sole basis for treatment or other patient management decisions. A negative result  may occur with  improper specimen collection/handling, submission of specimen other than nasopharyngeal swab, presence of viral mutation(s) within the areas targeted by this assay, and inadequate number of viral copies(<138 copies/mL). A negative result must be combined with clinical observations, patient history, and epidemiological information. The expected result is Negative.  Fact Sheet for Patients:  BloggerCourse.com  Fact Sheet for Healthcare Providers:  SeriousBroker.it  This test is no t yet approved or cleared by the [place] FDA and  has been authorized for detection and/or diagnosis of SARS-CoV-2 by FDA under an Emergency Use Authorization (EUA). This EUA will remain  in effect (meaning this test can be used) for the duration of the COVID-19 declaration under Section 564(b)(1) of the Act, 21 U.S.C.section 360bbb-3(b)(1), unless the authorization is terminated  or revoked sooner.       Influenza A by PCR NEGATIVE NEGATIVE Final   Influenza B by PCR NEGATIVE  NEGATIVE Final    Comment: (NOTE) The Xpert Xpress SARS-CoV-2/FLU/RSV plus assay is intended as an aid in the diagnosis of influenza from Nasopharyngeal swab specimens and should not be used as a sole basis for treatment. Nasal washings and aspirates are unacceptable for Xpert Xpress SARS-CoV-2/FLU/RSV testing.  Fact Sheet for Patients: BloggerCourse.com  Fact Sheet for Healthcare Providers: SeriousBroker.it  This test is not yet approved or cleared by the [place] FDA and has been authorized for detection and/or diagnosis of SARS-CoV-2 by FDA under an Emergency Use Authorization (EUA). This EUA will remain in effect (meaning this test can be used) for the duration of the COVID-19 declaration under Section 564(b)(1) of the Act, 21 U.S.C. section 360bbb-3(b)(1), unless the authorization is terminated or revoked.  Performed at [facility], [address], [place], [place] [address]   Culture, blood (Routine X 2) w Reflex to ID Panel     Status: None   Collection Time: 05/07/22  9:25 PM   Specimen: BLOOD  Result Value Ref Range Status   Specimen Description BLOOD SITE NOT SPECIFIED  Final   Special Requests   Final    BOTTLES DRAWN AEROBIC AND ANAEROBIC Blood Culture adequate volume   Culture   Final    NO GROWTH 5 DAYS Performed at [facility], [address], [place], [place] [address]    Report Status 05/12/2022 FINAL  Final  MRSA Next Gen by PCR, Nasal     Status: Abnormal   Collection Time: 05/07/22 10:22 PM   Specimen: Nasal Mucosa; Nasal Swab  Result Value Ref Range Status   MRSA by PCR Next Gen DETECTED (A) NOT DETECTED Final    Comment: RESULT CALLED TO, READ BACK BY AND VERIFIED WITH: J ALLEN,RN@0002  05/08/22 MK (NOTE) The GeneXpert MRSA Assay (FDA approved for NASAL specimens only), is one component of a comprehensive MRSA colonization surveillance program. It is not intended to diagnose MRSA  infection nor to guide or monitor treatment for MRSA infections. Test performance is not FDA approved in patients less than 81 years old. Performed at [facility], [address] [place], [place] [address]   Urine Culture     Status: None   Collection Time: 05/07/22 11:39 PM   Specimen: Urine, Catheterized  Result Value Ref Range Status   Specimen Description URINE, CATHETERIZED  Final   Special Requests NONE  Final   Culture   Final    NO GROWTH Performed at [facility], [address], [place], [place] [address]  Report Status 05/09/2022 FINAL  Final  Culture, blood (Routine X 2) w Reflex to ID Panel     Status: None (Preliminary result)   Collection Time: 05/08/22 12:33 AM   Specimen: BLOOD  Result Value Ref Range Status   Specimen Description BLOOD LEFT ANTECUBITAL  Final   Special Requests   Final    BOTTLES DRAWN AEROBIC AND ANAEROBIC Blood Culture adequate volume   Culture   Final    NO GROWTH 4 DAYS Performed at [facility], [address], [place], [place] [address]    Report Status PENDING  Incomplete     Labs: BNP (last 3 results) Recent Labs    01/25/22 1528 01/26/22 0432 05/09/22 0545  BNP 65.7 888.4* [id number]*   Basic Metabolic Panel: Recent Labs  Lab 05/08/22 0022 05/08/22 0457 05/09/22 0426 05/10/22 0128 05/11/22 0255 05/12/22 0230  NA 138 140 141 142 139 136  K 3.1* 3.2* 4.5 3.9 3.9 4.4  CL 103 109 113* 110 105 104  CO2 22 23 23 25 27 25   GLUCOSE 562* 315* 177* 134* 76 149*  BUN 29* 29* 33* 30* 27* 29*  CREATININE 2.17* 2.06* 2.19* 2.00* 1.98* 2.05*  CALCIUM 8.5* 8.1* 8.1* 8.2* 8.4* 7.8*  MG 2.0  --  2.0  --   --   --   PHOS  --  2.7 3.5  --   --   --    Liver Function Tests: Recent Labs  Lab 05/07/22 1948 05/09/22 0426  AST 21 20  ALT 33 28  ALKPHOS 115 63  BILITOT 0.7 0.3  PROT 6.3* 4.8*  ALBUMIN 2.8* 2.0*   No results for input(s): "LIPASE", "AMYLASE" in the last 168 hours. No results for input(s):  "AMMONIA" in the last 168 hours. CBC: Recent Labs  Lab 05/07/22 1948 05/07/22 1958 05/07/22 2104 05/08/22 0022 05/08/22 0457 05/09/22 0426  WBC 9.1  --   --  10.4 10.5 10.4  NEUTROABS 7.5  --   --   --   --   --   HGB 11.9* 12.9* 11.9* 9.8* 9.7* 9.4*  HCT 36.9* 38.0* 35.0* 30.1* 29.3* 29.8*  MCV 90.7  --   --  89.6 87.5 92.8  PLT 296  --   --  242 212 205   Cardiac Enzymes: No results for input(s): "CKTOTAL", "CKMB", "CKMBINDEX", "TROPONINI" in the last 168 hours. BNP: Invalid input(s): "POCBNP" CBG: Recent Labs  Lab 05/11/22 0757 05/11/22 1157 05/11/22 1538 05/11/22 2116 05/12/22 0627  GLUCAP 82 188* 114* 79 212*   D-Dimer No results for input(s): "DDIMER" in the last 72 hours. Hgb A1c No results for input(s): "HGBA1C" in the last 72 hours. Lipid Profile No results for input(s): "CHOL", "HDL", "LDLCALC", "TRIG", "CHOLHDL", "LDLDIRECT" in the last 72 hours. Thyroid function studies No results for input(s): "TSH", "T4TOTAL", "T3FREE", "THYROIDAB" in the last 72 hours.  Invalid input(s): "FREET3" Anemia work up No results for input(s): "VITAMINB12", "FOLATE", "FERRITIN", "TIBC", "IRON", "RETICCTPCT" in the last 72 hours. Urinalysis    Component Value Date/Time   COLORURINE YELLOW 05/07/2022 2339   APPEARANCEUR CLEAR 05/07/2022 2339   LABSPEC 1.021 05/07/2022 2339   PHURINE 6.0 05/07/2022 2339   GLUCOSEU >=500 (A) 05/07/2022 2339   HGBUR SMALL (A) 05/07/2022 2339   BILIRUBINUR NEGATIVE 05/07/2022 2339   KETONESUR NEGATIVE 05/07/2022 2339   PROTEINUR 100 (A) 05/07/2022 2339   UROBILINOGEN 0.2 01/01/2015 1118   NITRITE NEGATIVE 05/07/2022 [place] NEGATIVE 05/07/2022 2339  Sepsis Labs Recent Labs  Lab 05/07/22 1948 05/08/22 0022 05/08/22 0457 05/09/22 0426  WBC 9.1 10.4 10.5 10.4   Microbiology Recent Results (from the past 240 hour(s))  Resp Panel by RT-PCR (Flu A&B, Covid) Anterior Nasal Swab     Status: None   Collection Time: 05/07/22   8:49 PM   Specimen: Anterior Nasal Swab  Result Value Ref Range Status   SARS Coronavirus 2 by RT PCR NEGATIVE NEGATIVE Final    Comment: (NOTE) SARS-CoV-2 target nucleic acids are NOT DETECTED.  The SARS-CoV-2 RNA is generally detectable in upper respiratory specimens during the acute phase of infection. The lowest concentration of SARS-CoV-2 viral copies this assay can detect is 138 copies/mL. A negative result does not preclude SARS-Cov-2 infection and should not be used as the sole basis for treatment or other patient management decisions. A negative result may occur with  improper specimen collection/handling, submission of specimen other than nasopharyngeal swab, presence of viral mutation(s) within the areas targeted by this assay, and inadequate number of viral copies(<138 copies/mL). A negative result must be combined with clinical observations, patient history, and epidemiological information. The expected result is Negative.  Fact Sheet for Patients:  EntrepreneurPulse.com.au  Fact Sheet for Healthcare Providers:  IncredibleEmployment.be  This test is no t yet approved or cleared by the [place] FDA and  has been authorized for detection and/or diagnosis of SARS-CoV-2 by FDA under an Emergency Use Authorization (EUA). This EUA will remain  in effect (meaning this test can be used) for the duration of the COVID-19 declaration under Section 564(b)(1) of the Act, 21 U.S.C.section 360bbb-3(b)(1), unless the authorization is terminated  or revoked sooner.       Influenza A by PCR NEGATIVE NEGATIVE Final   Influenza B by PCR NEGATIVE NEGATIVE Final    Comment: (NOTE) The Xpert Xpress SARS-CoV-2/FLU/RSV plus assay is intended as an aid in the diagnosis of influenza from Nasopharyngeal swab specimens and should not be used as a sole basis for treatment. Nasal washings and aspirates are unacceptable for Xpert Xpress  SARS-CoV-2/FLU/RSV testing.  Fact Sheet for Patients: EntrepreneurPulse.com.au  Fact Sheet for Healthcare Providers: IncredibleEmployment.be  This test is not yet approved or cleared by the [place] FDA and has been authorized for detection and/or diagnosis of SARS-CoV-2 by FDA under an Emergency Use Authorization (EUA). This EUA will remain in effect (meaning this test can be used) for the duration of the COVID-19 declaration under Section 564(b)(1) of the Act, 21 U.S.C. section 360bbb-3(b)(1), unless the authorization is terminated or revoked.  Performed at [facility], [place] [address], [place], [place] [address]   Culture, blood (Routine X 2) w Reflex to ID Panel     Status: None   Collection Time: 05/07/22  9:25 PM   Specimen: BLOOD  Result Value Ref Range Status   Specimen Description BLOOD SITE NOT SPECIFIED  Final   Special Requests   Final    BOTTLES DRAWN AEROBIC AND ANAEROBIC Blood Culture adequate volume   Culture   Final    NO GROWTH 5 DAYS Performed at [facility], [address], [place], [place] [address]    Report Status 05/12/2022 FINAL  Final  MRSA Next Gen by PCR, Nasal     Status: Abnormal   Collection Time: 05/07/22 10:22 PM   Specimen: Nasal Mucosa; Nasal Swab  Result Value Ref Range Status   MRSA by PCR Next Gen DETECTED (A) NOT DETECTED Final  Comment: RESULT CALLED TO, READ BACK BY AND VERIFIED WITH: J ALLEN,RN@0002  05/08/22 [place] (NOTE) The GeneXpert MRSA Assay (FDA approved for NASAL specimens only), is one component of a comprehensive MRSA colonization surveillance program. It is not intended to diagnose MRSA infection nor to guide or monitor treatment for MRSA infections. Test performance is not FDA approved in patients less than 35 years old. Performed at [facility], [place] [address] [place], [place] [address]   Urine Culture     Status: None   Collection Time: 05/07/22 11:39 PM    Specimen: Urine, Catheterized  Result Value Ref Range Status   Specimen Description URINE, CATHETERIZED  Final   Special Requests NONE  Final   Culture   Final    NO GROWTH Performed at [facility], [address], [place], [place] [address]    Report Status 05/09/2022 FINAL  Final  Culture, blood (Routine X 2) w Reflex to ID Panel     Status: None (Preliminary result)   Collection Time: 05/08/22 12:33 AM   Specimen: BLOOD  Result Value Ref Range Status   Specimen Description BLOOD LEFT ANTECUBITAL  Final   Special Requests   Final    BOTTLES DRAWN AEROBIC AND ANAEROBIC Blood Culture adequate volume   Culture   Final    NO GROWTH 4 DAYS Performed at [facility], [place] [address], [place], [place] [address]    Report Status PENDING  Incomplete    Please note: You were cared for by a hospitalist during your hospital stay. Once you are discharged, your primary care physician will handle any further medical issues. Please note that NO REFILLS for any discharge medications will be authorized once you are discharged, as it is imperative that you return to your primary care physician (or establish a relationship with a primary care physician if you do not have one) for your post hospital discharge needs so that they can reassess your need for medications and monitor your lab values.    Time coordinating discharge: 40 minutes  SIGNED:   Shelly Coss, MD  [facility] 05/12/2022, 10:50 AM Pager [phone]  If 7PM-7AM, please contact night-coverage [email or web address] Password [email or web address]

## 2022-05-13 LAB — CULTURE, BLOOD (ROUTINE X 2)
Culture: NO GROWTH
Special Requests: ADEQUATE

## 2022-07-27 ENCOUNTER — Encounter: Payer: Self-pay | Admitting: Internal Medicine

## 2022-08-01 ENCOUNTER — Telehealth: Payer: Self-pay | Admitting: Gastroenterology

## 2022-08-01 NOTE — Telephone Encounter (Signed)
Good morning Dr Tarri Glenn  Supervising MD 9/28 AM  We have received a referral for patient to be seen in the office for C-DIFF. Patient has previous gi history with [facility].  Patient was last seen with them in 2021. Records are available for review in epics. Please review and advise on scheduling.  Thank you

## 2022-11-08 ENCOUNTER — Inpatient Hospital Stay (HOSPITAL_COMMUNITY)
Admission: EM | Admit: 2022-11-08 | Discharge: 2022-11-13 | DRG: 193 | Disposition: A | Discharge status: Skilled Nursing Facility | Payer: Medicare Other | Source: Skilled Nursing Facility | Attending: Internal Medicine | Admitting: Internal Medicine

## 2022-11-08 ENCOUNTER — Encounter (HOSPITAL_COMMUNITY): Payer: Self-pay | Admitting: Emergency Medicine

## 2022-11-08 ENCOUNTER — Inpatient Hospital Stay (HOSPITAL_COMMUNITY): Payer: Medicare Other

## 2022-11-08 ENCOUNTER — Emergency Department (HOSPITAL_COMMUNITY): Payer: Medicare Other

## 2022-11-08 ENCOUNTER — Other Ambulatory Visit: Payer: Self-pay

## 2022-11-08 DIAGNOSIS — Z91041 Radiographic dye allergy status: Secondary | ICD-10-CM

## 2022-11-08 DIAGNOSIS — D638 Anemia in other chronic diseases classified elsewhere: Secondary | ICD-10-CM | POA: Diagnosis not present

## 2022-11-08 DIAGNOSIS — A419 Sepsis, unspecified organism: Secondary | ICD-10-CM

## 2022-11-08 DIAGNOSIS — Z79899 Other long term (current) drug therapy: Secondary | ICD-10-CM | POA: Diagnosis not present

## 2022-11-08 DIAGNOSIS — E876 Hypokalemia: Secondary | ICD-10-CM | POA: Diagnosis present

## 2022-11-08 DIAGNOSIS — K529 Noninfective gastroenteritis and colitis, unspecified: Secondary | ICD-10-CM | POA: Diagnosis present

## 2022-11-08 DIAGNOSIS — F17210 Nicotine dependence, cigarettes, uncomplicated: Secondary | ICD-10-CM | POA: Diagnosis present

## 2022-11-08 DIAGNOSIS — I10 Essential (primary) hypertension: Secondary | ICD-10-CM | POA: Diagnosis present

## 2022-11-08 DIAGNOSIS — Z833 Family history of diabetes mellitus: Secondary | ICD-10-CM

## 2022-11-08 DIAGNOSIS — E162 Hypoglycemia, unspecified: Secondary | ICD-10-CM | POA: Diagnosis present

## 2022-11-08 DIAGNOSIS — Z8673 Personal history of transient ischemic attack (TIA), and cerebral infarction without residual deficits: Secondary | ICD-10-CM | POA: Diagnosis not present

## 2022-11-08 DIAGNOSIS — E1149 Type 2 diabetes mellitus with other diabetic neurological complication: Secondary | ICD-10-CM | POA: Diagnosis not present

## 2022-11-08 DIAGNOSIS — E11649 Type 2 diabetes mellitus with hypoglycemia without coma: Secondary | ICD-10-CM | POA: Diagnosis not present

## 2022-11-08 DIAGNOSIS — E1122 Type 2 diabetes mellitus with diabetic chronic kidney disease: Secondary | ICD-10-CM | POA: Diagnosis present

## 2022-11-08 DIAGNOSIS — I5033 Acute on chronic diastolic (congestive) heart failure: Secondary | ICD-10-CM

## 2022-11-08 DIAGNOSIS — Z888 Allergy status to other drugs, medicaments and biological substances status: Secondary | ICD-10-CM

## 2022-11-08 DIAGNOSIS — Z7902 Long term (current) use of antithrombotics/antiplatelets: Secondary | ICD-10-CM | POA: Diagnosis not present

## 2022-11-08 DIAGNOSIS — Z7982 Long term (current) use of aspirin: Secondary | ICD-10-CM

## 2022-11-08 DIAGNOSIS — N1832 Chronic kidney disease, stage 3b: Secondary | ICD-10-CM | POA: Diagnosis not present

## 2022-11-08 DIAGNOSIS — I130 Hypertensive heart and chronic kidney disease with heart failure and stage 1 through stage 4 chronic kidney disease, or unspecified chronic kidney disease: Secondary | ICD-10-CM | POA: Diagnosis present

## 2022-11-08 DIAGNOSIS — E785 Hyperlipidemia, unspecified: Secondary | ICD-10-CM | POA: Diagnosis present

## 2022-11-08 DIAGNOSIS — E1140 Type 2 diabetes mellitus with diabetic neuropathy, unspecified: Secondary | ICD-10-CM | POA: Diagnosis present

## 2022-11-08 DIAGNOSIS — G40909 Epilepsy, unspecified, not intractable, without status epilepticus: Secondary | ICD-10-CM | POA: Diagnosis not present

## 2022-11-08 DIAGNOSIS — F32A Depression, unspecified: Secondary | ICD-10-CM | POA: Diagnosis present

## 2022-11-08 DIAGNOSIS — Z794 Long term (current) use of insulin: Secondary | ICD-10-CM | POA: Diagnosis not present

## 2022-11-08 DIAGNOSIS — Z885 Allergy status to narcotic agent status: Secondary | ICD-10-CM

## 2022-11-08 DIAGNOSIS — J189 Pneumonia, unspecified organism: Principal | ICD-10-CM | POA: Diagnosis present

## 2022-11-08 DIAGNOSIS — R509 Fever, unspecified: Secondary | ICD-10-CM

## 2022-11-08 DIAGNOSIS — N179 Acute kidney failure, unspecified: Secondary | ICD-10-CM | POA: Diagnosis not present

## 2022-11-08 DIAGNOSIS — E1165 Type 2 diabetes mellitus with hyperglycemia: Secondary | ICD-10-CM | POA: Diagnosis present

## 2022-11-08 DIAGNOSIS — F419 Anxiety disorder, unspecified: Secondary | ICD-10-CM | POA: Diagnosis present

## 2022-11-08 DIAGNOSIS — F418 Other specified anxiety disorders: Secondary | ICD-10-CM | POA: Diagnosis not present

## 2022-11-08 DIAGNOSIS — D631 Anemia in chronic kidney disease: Secondary | ICD-10-CM | POA: Diagnosis present

## 2022-11-08 DIAGNOSIS — Z8249 Family history of ischemic heart disease and other diseases of the circulatory system: Secondary | ICD-10-CM

## 2022-11-08 DIAGNOSIS — I358 Other nonrheumatic aortic valve disorders: Secondary | ICD-10-CM | POA: Diagnosis present

## 2022-11-08 DIAGNOSIS — Z1152 Encounter for screening for COVID-19: Secondary | ICD-10-CM

## 2022-11-08 DIAGNOSIS — E118 Type 2 diabetes mellitus with unspecified complications: Secondary | ICD-10-CM | POA: Diagnosis not present

## 2022-11-08 DIAGNOSIS — Z8619 Personal history of other infectious and parasitic diseases: Secondary | ICD-10-CM

## 2022-11-08 HISTORY — DX: Sepsis, unspecified organism: A41.9

## 2022-11-08 HISTORY — DX: Acute on chronic diastolic (congestive) heart failure: I50.33

## 2022-11-08 LAB — CBC WITH DIFFERENTIAL/PLATELET
Abs Immature Granulocytes: 0.02 10*3/uL (ref 0.00–0.07)
Basophils Absolute: 0 10*3/uL (ref 0.0–0.1)
Basophils Relative: 0 %
Eosinophils Absolute: 0 10*3/uL (ref 0.0–0.5)
Eosinophils Relative: 0 %
HCT: 27.7 % — ABNORMAL LOW (ref 39.0–52.0)
Hemoglobin: 8.7 g/dL — ABNORMAL LOW (ref 13.0–17.0)
Immature Granulocytes: 0 %
Lymphocytes Relative: 5 %
Lymphs Abs: 0.6 10*3/uL — ABNORMAL LOW (ref 0.7–4.0)
MCH: 30.6 pg (ref 26.0–34.0)
MCHC: 31.4 g/dL (ref 30.0–36.0)
MCV: 97.5 fL (ref 80.0–100.0)
Monocytes Absolute: 0.3 10*3/uL (ref 0.1–1.0)
Monocytes Relative: 3 %
Neutro Abs: 11.2 10*3/uL — ABNORMAL HIGH (ref 1.7–7.7)
Neutrophils Relative %: 92 %
Platelets: 224 10*3/uL (ref 150–400)
RBC: 2.84 MIL/uL — ABNORMAL LOW (ref 4.22–5.81)
RDW: 15.3 % (ref 11.5–15.5)
WBC: 12.2 10*3/uL — ABNORMAL HIGH (ref 4.0–10.5)
nRBC: 0 % (ref 0.0–0.2)

## 2022-11-08 LAB — COMPREHENSIVE METABOLIC PANEL
ALT: 29 U/L (ref 0–44)
AST: 29 U/L (ref 15–41)
Albumin: 2.4 g/dL — ABNORMAL LOW (ref 3.5–5.0)
Alkaline Phosphatase: 62 U/L (ref 38–126)
Anion gap: 8 (ref 5–15)
BUN: 40 mg/dL — ABNORMAL HIGH (ref 8–23)
CO2: 26 mmol/L (ref 22–32)
Calcium: 8.1 mg/dL — ABNORMAL LOW (ref 8.9–10.3)
Chloride: 107 mmol/L (ref 98–111)
Creatinine, Ser: 3.65 mg/dL — ABNORMAL HIGH (ref 0.61–1.24)
GFR, Estimated: 18 mL/min — ABNORMAL LOW (ref >60)
Glucose, Bld: 63 mg/dL — ABNORMAL LOW (ref 70–99)
Potassium: 3.5 mmol/L (ref 3.5–5.1)
Sodium: 141 mmol/L (ref 135–145)
Total Bilirubin: 0.6 mg/dL (ref 0.3–1.2)
Total Protein: 5.5 g/dL — ABNORMAL LOW (ref 6.5–8.1)

## 2022-11-08 LAB — URINALYSIS, ROUTINE W REFLEX MICROSCOPIC
Bilirubin Urine: NEGATIVE
Glucose, UA: 50 mg/dL — AB
Ketones, ur: NEGATIVE mg/dL
Nitrite: NEGATIVE
Protein, ur: >=300 mg/dL — AB
Specific Gravity, Urine: 1.02 (ref 1.005–1.030)
WBC, UA: >50 WBC/hpf — ABNORMAL HIGH (ref 0–5)
pH: 5 (ref 5.0–8.0)

## 2022-11-08 LAB — PHOSPHORUS: Phosphorus: 3.6 mg/dL (ref 2.5–4.6)

## 2022-11-08 LAB — CBG MONITORING, ED
Glucose-Capillary: 72 mg/dL (ref 70–99)
Glucose-Capillary: 162 mg/dL — ABNORMAL HIGH (ref 70–99)
Glucose-Capillary: 80 mg/dL (ref 70–99)

## 2022-11-08 LAB — GLUCOSE, CAPILLARY
Glucose-Capillary: 116 mg/dL — ABNORMAL HIGH (ref 70–99)
Glucose-Capillary: 69 mg/dL — ABNORMAL LOW (ref 70–99)
Glucose-Capillary: 77 mg/dL (ref 70–99)

## 2022-11-08 LAB — BRAIN NATRIURETIC PEPTIDE: B Natriuretic Peptide: 2183.5 pg/mL — ABNORMAL HIGH (ref 0.0–100.0)

## 2022-11-08 LAB — MAGNESIUM: Magnesium: 1.9 mg/dL (ref 1.7–2.4)

## 2022-11-08 LAB — RESP PANEL BY RT-PCR (RSV, FLU A&B, COVID)  RVPGX2
Influenza A by PCR: NEGATIVE
Influenza B by PCR: NEGATIVE
Resp Syncytial Virus by PCR: NEGATIVE
SARS Coronavirus 2 by RT PCR: NEGATIVE

## 2022-11-08 LAB — PROTIME-INR
INR: 1.2 (ref 0.8–1.2)
Prothrombin Time: 14.7 seconds (ref 11.4–15.2)

## 2022-11-08 LAB — APTT: aPTT: 36 seconds (ref 24–36)

## 2022-11-08 LAB — LACTIC ACID, PLASMA
Lactic Acid, Venous: 2.1 mmol/L (ref 0.5–1.9)
Lactic Acid, Venous: 1.4 mmol/L (ref 0.5–1.9)

## 2022-11-08 MED ORDER — SODIUM BICARBONATE 650 MG PO TABS
1300.0000 mg | ORAL_TABLET | Freq: Two times a day (BID) | ORAL | Status: DC
  Administered 2022-11-09 – 2022-11-13 (×8): 1300 mg via ORAL
  Filled 2022-11-08 (×8): qty 2

## 2022-11-08 MED ORDER — ASPIRIN 81 MG PO TBEC
81.0000 mg | DELAYED_RELEASE_TABLET | Freq: Every day | ORAL | Status: DC
  Administered 2022-11-10 – 2022-11-13 (×4): 81 mg via ORAL
  Filled 2022-11-08 (×4): qty 1

## 2022-11-08 MED ORDER — SERTRALINE HCL 50 MG PO TABS
50.0000 mg | ORAL_TABLET | Freq: Every day | ORAL | Status: DC
  Administered 2022-11-10 – 2022-11-13 (×4): 50 mg via ORAL
  Filled 2022-11-08 (×4): qty 1

## 2022-11-08 MED ORDER — VITAMIN D 25 MCG (1000 UNIT) PO TABS
2000.0000 [IU] | ORAL_TABLET | Freq: Every day | ORAL | Status: DC
  Administered 2022-11-10 – 2022-11-13 (×4): 2000 [IU] via ORAL
  Filled 2022-11-08 (×4): qty 2

## 2022-11-08 MED ORDER — ACETAMINOPHEN 650 MG RE SUPP
650.0000 mg | Freq: Four times a day (QID) | RECTAL | Status: DC | PRN
  Administered 2022-11-08: 650 mg via RECTAL
  Filled 2022-11-08: qty 1

## 2022-11-08 MED ORDER — SODIUM CHLORIDE 0.9 % IV BOLUS
500.0000 mL | Freq: Once | INTRAVENOUS | Status: AC
  Administered 2022-11-08: 500 mL via INTRAVENOUS

## 2022-11-08 MED ORDER — LEVETIRACETAM 500 MG PO TABS
500.0000 mg | ORAL_TABLET | Freq: Two times a day (BID) | ORAL | Status: DC
  Administered 2022-11-09 – 2022-11-13 (×8): 500 mg via ORAL
  Filled 2022-11-08 (×8): qty 1

## 2022-11-08 MED ORDER — GABAPENTIN 100 MG PO CAPS
100.0000 mg | ORAL_CAPSULE | Freq: Every day | ORAL | Status: DC
  Administered 2022-11-09 – 2022-11-12 (×4): 100 mg via ORAL
  Filled 2022-11-08 (×4): qty 1

## 2022-11-08 MED ORDER — SODIUM CHLORIDE 0.9 % IV SOLN
500.0000 mg | INTRAVENOUS | Status: AC
  Administered 2022-11-08 – 2022-11-12 (×5): 500 mg via INTRAVENOUS
  Filled 2022-11-08 (×5): qty 5

## 2022-11-08 MED ORDER — ONDANSETRON HCL 4 MG/2ML IJ SOLN: 4.0000 mg | Freq: Four times a day (QID) | INTRAMUSCULAR | Status: DC | PRN

## 2022-11-08 MED ORDER — DEXTROSE 50 % IV SOLN
25.0000 mL | Freq: Once | INTRAVENOUS | Status: AC
  Administered 2022-11-08: 25 mL via INTRAVENOUS
  Filled 2022-11-08: qty 50

## 2022-11-08 MED ORDER — IPRATROPIUM-ALBUTEROL 0.5-2.5 (3) MG/3ML IN SOLN
3.0000 mL | Freq: Four times a day (QID) | RESPIRATORY_TRACT | Status: DC
  Administered 2022-11-08: 3 mL via RESPIRATORY_TRACT

## 2022-11-08 MED ORDER — ATORVASTATIN CALCIUM 40 MG PO TABS
40.0000 mg | ORAL_TABLET | Freq: Every day | ORAL | Status: DC
  Administered 2022-11-10 – 2022-11-13 (×4): 40 mg via ORAL
  Filled 2022-11-08 (×4): qty 1

## 2022-11-08 MED ORDER — ALBUTEROL SULFATE (2.5 MG/3ML) 0.083% IN NEBU
2.5000 mg | INHALATION_SOLUTION | RESPIRATORY_TRACT | Status: DC | PRN
  Filled 2022-11-08: qty 3

## 2022-11-08 MED ORDER — ONDANSETRON HCL 4 MG PO TABS: 4.0000 mg | ORAL_TABLET | Freq: Four times a day (QID) | ORAL | Status: DC | PRN

## 2022-11-08 MED ORDER — CLOPIDOGREL BISULFATE 75 MG PO TABS
75.0000 mg | ORAL_TABLET | Freq: Every day | ORAL | Status: DC
  Administered 2022-11-10 – 2022-11-13 (×4): 75 mg via ORAL
  Filled 2022-11-08 (×4): qty 1

## 2022-11-08 MED ORDER — CARVEDILOL 12.5 MG PO TABS
25.0000 mg | ORAL_TABLET | Freq: Two times a day (BID) | ORAL | Status: DC
  Administered 2022-11-10 – 2022-11-13 (×7): 25 mg via ORAL
  Filled 2022-11-08 (×8): qty 2

## 2022-11-08 MED ORDER — ACETAMINOPHEN 325 MG PO TABS: 650.0000 mg | ORAL_TABLET | Freq: Four times a day (QID) | ORAL | Status: DC | PRN

## 2022-11-08 MED ORDER — LEVETIRACETAM IN NACL 500 MG/100ML IV SOLN
500.0000 mg | Freq: Once | INTRAVENOUS | Status: AC
  Administered 2022-11-08: 500 mg via INTRAVENOUS
  Filled 2022-11-08: qty 100

## 2022-11-08 MED ORDER — SODIUM CHLORIDE 0.9 % IV SOLN
1.0000 g | INTRAVENOUS | Status: AC
  Administered 2022-11-09 – 2022-11-12 (×4): 1 g via INTRAVENOUS
  Filled 2022-11-08 (×4): qty 10

## 2022-11-08 MED ORDER — DEXTROSE 10 % IV SOLN: INTRAVENOUS | Status: DC

## 2022-11-08 MED ORDER — ISOSORBIDE DINITRATE 20 MG PO TABS
40.0000 mg | ORAL_TABLET | Freq: Three times a day (TID) | ORAL | Status: DC
  Administered 2022-11-09 – 2022-11-13 (×11): 40 mg via ORAL
  Filled 2022-11-08 (×12): qty 2

## 2022-11-08 MED ORDER — SODIUM CHLORIDE 0.9 % IV BOLUS: 500.0000 mL | Freq: Once | INTRAVENOUS | Status: DC

## 2022-11-08 MED ORDER — CLONIDINE HCL 0.1 MG PO TABS
0.3000 mg | ORAL_TABLET | Freq: Two times a day (BID) | ORAL | Status: DC
  Administered 2022-11-09 – 2022-11-10 (×2): 0.3 mg via ORAL
  Filled 2022-11-08 (×2): qty 3

## 2022-11-08 MED ORDER — SODIUM CHLORIDE 0.9 % IV SOLN
2.0000 g | Freq: Once | INTRAVENOUS | Status: AC
  Administered 2022-11-08: 2 g via INTRAVENOUS
  Filled 2022-11-08: qty 20

## 2022-11-08 MED ORDER — ACETAMINOPHEN 325 MG PO TABS
650.0000 mg | ORAL_TABLET | Freq: Once | ORAL | Status: AC
  Administered 2022-11-08: 650 mg via ORAL
  Filled 2022-11-08: qty 2

## 2022-11-08 MED ORDER — HYDRALAZINE HCL 50 MG PO TABS
100.0000 mg | ORAL_TABLET | Freq: Three times a day (TID) | ORAL | Status: DC
  Administered 2022-11-09 – 2022-11-13 (×11): 100 mg via ORAL
  Filled 2022-11-08 (×12): qty 2

## 2022-11-08 MED ORDER — AMLODIPINE BESYLATE 5 MG PO TABS
10.0000 mg | ORAL_TABLET | Freq: Every day | ORAL | Status: DC
  Administered 2022-11-10 – 2022-11-13 (×4): 10 mg via ORAL
  Filled 2022-11-08 (×4): qty 2

## 2022-11-08 NOTE — ED Provider Notes (Signed)
[facility]-EMERGENCY DEPT Provider Note   CSN: [id number] Arrival date & time: 11/08/22  0945     History  Chief Complaint  Patient presents with   Fever    Manuel Kramer is a 67 y.o. male.  Patient has a history of chronic kidney disease diabetes hypertension heart failure and seizures with a stroke.  Patient has developed a cough and fever and lethargy  The history is provided by the patient and medical records. No language interpreter was used.  Fever Temp source:  Oral Severity:  Moderate Onset quality:  Sudden Timing:  Constant Progression:  Worsening Chronicity:  New Relieved by:  Nothing Worsened by:  Nothing Ineffective treatments:  None tried Associated symptoms: cough   Associated symptoms: no chest pain, no congestion, no diarrhea, no headaches and no rash        Home Medications Prior to Admission medications   Medication Sig Start Date End Date Taking? Authorizing Provider  acetaminophen (TYLENOL) 500 MG tablet Take 1 tablet (500 mg total) by mouth every 6 (six) hours as needed. 08/22/16  Yes Upstill, Melvenia Beam, PA-C  amLODipine (NORVASC) 10 MG tablet Take 1 tablet (10 mg total) by mouth daily. 01/05/17  Yes Hoy Register, MD  aspirin EC 81 MG tablet Take 81 mg by mouth daily. Swallow whole.   Yes [provider]  atorvastatin (LIPITOR) 40 MG tablet Take 1 tablet (40 mg total) by mouth daily. 12/06/21  Yes Briant Cedar, MD  carvedilol (COREG) 25 MG tablet Take 1 tablet (25 mg total) by mouth 2 (two) times daily with a meal. 12/05/21  Yes Briant Cedar, MD  cholecalciferol (VITAMIN D3) 25 MCG (1000 UNIT) tablet Take 2,000 Units by mouth daily.   Yes [provider]  Cinnamon 500 MG capsule Take 500 mg by mouth daily.   Yes [provider]  cloNIDine (CATAPRES) 0.3 MG tablet Take 1 tablet (0.3 mg total) by mouth 3 (three) times daily. 02/02/22  Yes Rai, Ripudeep K, MD  clopidogrel (PLAVIX) 75 MG tablet  Take 75 mg by mouth daily.   Yes [provider]  DESITIN 40 % PSTE Apply 1 Application topically 2 (two) times daily. 04/24/22  Yes [provider]  gabapentin (NEURONTIN) 100 MG capsule Take 100 mg by mouth at bedtime.   Yes [provider]  Garlic 500 MG CAPS Take 500 mg by mouth daily.   Yes [provider]  hydrALAZINE (APRESOLINE) 100 MG tablet Take 1 tablet (100 mg total) by mouth every 8 (eight) hours. 12/05/21  Yes Briant Cedar, MD  Infant Care Products [facility]) OINT Apply 1 Application topically in the morning, at noon, and at bedtime. 02/04/22  Yes [provider]  insulin aspart (NOVOLOG) 100 UNIT/ML injection Inject 3 Units into the skin 3 (three) times daily with meals. If eats more than 50% of meals Patient taking differently: Inject 3-11 Units into the skin 3 (three) times daily with meals.  Per sliding scale:  CBG 101-150 = 2 units, CBG  151-200 = 3 units, 201-250 = 5 units, 251-300 = 7 units, 301-350 = 9 units, > 350 = 11 units call MD if CBG > 400 05/12/22  Yes Adhikari, Amrit, MD  insulin glargine (LANTUS) 100 UNIT/ML Solostar Pen Inject 12 Units into the skin at bedtime. Patient taking differently: Inject 10 Units into the skin at bedtime. 05/12/22  Yes Burnadette Pop, MD  ipratropium (ATROVENT) 0.06 % nasal spray Place 2 sprays into both  nostrils 2 (two) times daily.   Yes [provider]  isosorbide dinitrate (ISORDIL) 40 MG tablet Take 40 mg by mouth 3 (three) times daily. 11/06/22  Yes [provider]  levETIRAcetam (KEPPRA) 500 MG tablet Take 1 tablet (500 mg total) by mouth 2 (two) times daily. 05/12/22  Yes Shelly Coss, MD  loratadine (CLARITIN) 10 MG tablet Take 10 mg by mouth every other day.   Yes [provider]  losartan (COZAAR) 100 MG tablet Take 1 tablet (100 mg total) by mouth daily. 05/12/22  Yes Shelly Coss, MD  Multiple Vitamin (MULTIVITAMIN WITH MINERALS) TABS tablet Take 1 tablet  by mouth daily. 12/06/21  Yes Alma Friendly, MD  niacin 100 MG tablet Take 100 mg by mouth 2 (two) times daily.   Yes [provider]  sertraline (ZOLOFT) 50 MG tablet Take 50 mg by mouth daily. 04/17/22  Yes [provider]  sodium bicarbonate 650 MG tablet Take 1,300 mg by mouth 2 (two) times daily.   Yes [provider]  spironolactone (ALDACTONE) 25 MG tablet Take 1 tablet (25 mg total) by mouth daily. 05/13/22  Yes Shelly Coss, MD  torsemide 40 MG TABS Take 40 mg by mouth daily. 05/12/22  Yes Shelly Coss, MD      Allergies    Codeine, Iodinated contrast media, Pentazocine, and Statins    Review of Systems   Review of Systems  Constitutional:  Positive for fever. Negative for appetite change and fatigue.  HENT:  Negative for congestion, ear discharge and sinus pressure.   Eyes:  Negative for discharge.  Respiratory:  Positive for cough.   Cardiovascular:  Negative for chest pain.  Gastrointestinal:  Negative for abdominal pain and diarrhea.  Genitourinary:  Negative for frequency and hematuria.  Musculoskeletal:  Negative for back pain.  Skin:  Negative for rash.  Neurological:  Negative for seizures and headaches.  Psychiatric/Behavioral:  Negative for hallucinations.     Physical Exam Updated Vital Signs BP (!) 153/78 (BP Location: Right Arm)   Pulse 75   Temp 98.8 F (37.1 C) (Axillary)   Resp 19   SpO2 96%  Physical Exam Vitals and nursing note reviewed.  Constitutional:      Appearance: He is well-developed.     Comments: Mild lethargy  HENT:     Head: Normocephalic.     Nose: Nose normal.  Eyes:     General: No scleral icterus.    Conjunctiva/sclera: Conjunctivae normal.  Neck:     Thyroid: No thyromegaly.  Cardiovascular:     Rate and Rhythm: Normal rate and regular rhythm.     Heart sounds: No murmur heard.    No friction rub. No gallop.  Pulmonary:     Breath sounds: No stridor. No wheezing or rales.  Chest:      Chest wall: No tenderness.  Abdominal:     General: There is no distension.     Tenderness: There is no abdominal tenderness. There is no rebound.  Musculoskeletal:        General: Normal range of motion.     Cervical back: Neck supple.  Lymphadenopathy:     Cervical: No cervical adenopathy.  Skin:    Findings: No erythema or rash.  Neurological:     Mental Status: He is oriented to person, place, and time.     Motor: No abnormal muscle tone.     Coordination: Coordination normal.  Psychiatric:        Behavior: Behavior  normal.     ED Results / Procedures / Treatments   Labs (all labs ordered are listed, but only abnormal results are displayed) Labs Reviewed  CBC WITH DIFFERENTIAL/PLATELET - Abnormal; Notable for the following components:      Result Value   WBC 12.2 (*)    RBC 2.84 (*)    Hemoglobin 8.7 (*)    HCT 27.7 (*)    Neutro Abs 11.2 (*)    Lymphs Abs 0.6 (*)    All other components within normal limits  COMPREHENSIVE METABOLIC PANEL - Abnormal; Notable for the following components:   Glucose, Bld 63 (*)    BUN 40 (*)    Creatinine, Ser 3.65 (*)    Calcium 8.1 (*)    Total Protein 5.5 (*)    Albumin 2.4 (*)    GFR, Estimated 18 (*)    All other components within normal limits  LACTIC ACID, PLASMA - Abnormal; Notable for the following components:   Lactic Acid, Venous 2.1 (*)    All other components within normal limits  RESP PANEL BY RT-PCR (RSV, FLU A&B, COVID)  [id number]  CULTURE, BLOOD (ROUTINE X 2)  CULTURE, BLOOD (ROUTINE X 2)  URINE CULTURE  PROTIME-INR  APTT  URINALYSIS, ROUTINE W REFLEX MICROSCOPIC  LACTIC ACID, PLASMA  BRAIN NATRIURETIC PEPTIDE  CBG MONITORING, ED    EKG None  Radiology DG Chest Port 1 View  Result Date: 11/08/2022 CLINICAL DATA:  Shortness of breath and weakness EXAM: PORTABLE CHEST 1 VIEW COMPARISON:  X-ray 05/07/2022.  CT 05/08/2022.  Older exams as well. FINDINGS: Enlarged cardiopericardial silhouette with vascular  congestion mild opacity. No pneumothorax, effusion. Overlapping cardiac leads. IMPRESSION: Enlarged cardiopericardial silhouette with diffuse vascular congestion. Electronically Signed   By: Karen Kays M.D.   On: 11/08/2022 10:30    Procedures Procedures    Medications Ordered in ED Medications  acetaminophen (TYLENOL) tablet 650 mg (650 mg Oral Given 11/08/22 1049)  sodium chloride 0.9 % bolus 500 mL (0 mLs Intravenous Stopped 11/08/22 1227)  cefTRIAXone (ROCEPHIN) 2 g in sodium chloride 0.9 % 100 mL IVPB (0 g Intravenous Stopped 11/08/22 1227)  dextrose 50 % solution 25 mL (25 mLs Intravenous Given 11/08/22 1351)    ED Course/ Medical Decision Making/ A&P                           Medical Decision Making Amount and/or Complexity of Data Reviewed Labs: ordered. Radiology: ordered. ECG/medicine tests: ordered.  Risk OTC drugs. Prescription drug management. Decision regarding hospitalization.  This patient presents to the ED for concern of fever cough weakness, this involves an extensive number of treatment options, and is a complaint that carries with it a high risk of complications and morbidity.  The differential diagnosis includes pneumonia, COVID   Co morbidities that complicate the patient evaluation  Chronic kidney disease and hypertension   Additional history obtained:  Additional history obtained from patient External records from outside source obtained and reviewed including hospital records   Lab Tests:  I Ordered, and personally interpreted labs.  The pertinent results include: UA suggest UTI, white blood count 12.2, BMP 213   Imaging Studies ordered:  I ordered imaging studies including CT chest and chest x-ray I independently visualized and interpreted imaging which showed vascular congestion I agree with the radiologist interpretation   Cardiac Monitoring: / EKG:  The patient was maintained on a cardiac monitor.  I personally viewed and interpreted  the cardiac monitored which showed an underlying rhythm of: Normal sinus rhythm   Consultations Obtained:  I requested consultation with the hospitalist,  and discussed lab and imaging findings as well as pertinent plan - they recommend: Admit   Problem List / ED Course / Critical interventions / Medication management  Kidney disease, congestive heart failure I ordered medication including Rocephin for possible infection Reevaluation of the patient after these medicines showed that the patient stayed the same I have reviewed the patients home medicines and have made adjustments as needed   Social Determinants of Health:  None   Test / Admission - Considered:  None  Patient will be admitted to medicine for AKI and febrile respiratory infection possible pneumonia, and congestive heart failure and possible UTI        Final Clinical Impression(s) / ED Diagnoses Final diagnoses:  AKI (acute kidney injury) ([place])  Febrile illness    Rx / DC Orders ED Discharge Orders     None         Milton Ferguson, MD 11/11/22 [phone]

## 2022-11-08 NOTE — H&P (Signed)
History and Physical    Patient: Manuel Kramer:[id number] DOB: Jun 24, 1956 DOA: 11/08/2022 DOS: the patient was seen and examined on 11/08/2022 PCP: Diamantina Providence, FNP  Patient coming from: SNF  Chief Complaint:  Chief Complaint  Patient presents with   Fever   HPI: Manuel Kramer is a 67 y.o. male with medical history significant of chronic diarrhea, history of C. 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  He is still somnolent, but wakes up easily and answers simple questions.  He denied headache, sore throat, rhinorrhea, abdominal/back/chest pain at this time.  ED course: Initial vital signs were temperature 101 F, pulse 81, respirations 25, BP 162/73 mmHg O2 sat 94% on room air.  He received 500 mL of normal saline bolus, dextrose 50% 25 mL IVP x 1, acetaminophen 650 mg p.o. x 1 and 2 g of ceftriaxone IVPB.  Lab work: His urinalysis was cloudy with glucosuria 50 mg/dL, small hemoglobinuria, proteinuria more than 300 mg/dL, moderate leukocyte esterase, more than 50 WBC with rare bacteria and budding yeast on microscopic examination.CBC showed a white count of 12.2, hemoglobin 8.7 g/dL platelets 956.  Normal PT, INR and PTT.  Negative coronavirus, influenza and RSV PCR.  BNP 2183.5 pg/mL.  Lactic acid is 2.1 then 1.4 mmol/L.  CMP showed normal electrolytes after calcium correction.  Glucose 63, BUN 40 and creatinine 3.65 mg/dL.  Total protein 5.5 and albumin 2.4 g/dL.  Magnesium was 1.9 and phosphorus 3.6 mg/dL.  Imaging: Portable 1 view chest radiograph show enlarged  pericardial silhouette with diffuse vascular congestion.   Review of Systems: As mentioned in the history of present illness. All other systems reviewed and are negative.  Past Medical History:  Diagnosis Date   Chronic diarrhea    Diabetes mellitus without complication (HCC)    Hypertension    Past Surgical History:  Procedure Laterality Date   COLOSCOPY      POLYP   Social History:  reports that he has been smoking cigarettes. He has a 20.00 pack-year smoking history. He has quit using smokeless tobacco. He reports that he does not currently use alcohol after a past usage of about 1.0 standard drink of alcohol per week. He reports that he does not use drugs.  Allergies  Allergen Reactions   Codeine Other (See Comments)    On MAR, unknown reaction   Iodinated Contrast Media Other (See Comments)    On MAR, unknown reaction   Pentazocine Other (See Comments)    On MAR, unknown reaction   Statins Other (See Comments)    On MAR, unknown reaction    Family History  Problem Relation Age of Onset   Cancer Mother    Diabetes Mother    Hypertension Mother    Cancer Father     Prior to Admission medications   Medication Sig Start Date End Date Taking? Authorizing Provider  acetaminophen (TYLENOL) 500 MG tablet Take 1 tablet (500 mg total) by mouth every 6 (six) hours as needed. 08/22/16  Yes Upstill, Melvenia Beam, PA-C  amLODipine (NORVASC) 10 MG tablet Take 1 tablet (10 mg total) by mouth daily. 01/05/17  Yes Hoy Register, MD  aspirin  EC 81 MG tablet Take 81 mg by mouth daily. Swallow whole.   Yes [provider]  atorvastatin (LIPITOR) 40 MG tablet Take 1 tablet (40 mg total) by mouth daily. 12/06/21  Yes Briant Cedar, MD  carvedilol (COREG) 25 MG tablet Take 1 tablet (25 mg total) by mouth 2 (two) times daily with a meal. 12/05/21  Yes Briant Cedar, MD  cholecalciferol (VITAMIN D3) 25 MCG (1000 UNIT) tablet Take 2,000 Units by mouth daily.   Yes [provider]  Cinnamon 500 MG capsule Take 500 mg by mouth daily.   Yes [provider]  cloNIDine (CATAPRES) 0.3 MG tablet Take 1 tablet (0.3 mg total) by mouth 3 (three) times daily. 02/02/22  Yes Rai, Ripudeep K, MD  clopidogrel (PLAVIX) 75 MG tablet Take 75 mg by mouth daily.   Yes [provider]  DESITIN 40 % PSTE Apply 1 Application topically 2 (two) times daily. 04/24/22  Yes [provider]  gabapentin (NEURONTIN) 100 MG capsule Take 100 mg by mouth at bedtime.   Yes [provider]  Garlic 500 MG CAPS Take 500 mg by mouth daily.   Yes [provider]  hydrALAZINE (APRESOLINE) 100 MG tablet Take 1 tablet (100 mg total) by mouth every 8 (eight) hours. 12/05/21  Yes Briant Cedar, MD  Infant [employer] [facility]) OINT Apply 1 Application topically in the morning, at noon, and at bedtime. 02/04/22  Yes [provider]  insulin aspart (NOVOLOG) 100 UNIT/ML injection Inject 3 Units into the skin 3 (three) times daily with meals. If eats more than 50% of meals Patient taking differently: Inject 3-11 Units into the skin 3 (three) times daily with meals.  Per sliding scale:  CBG 101-150 = 2 units, CBG  151-200 = 3 units, 201-250 = 5 units, 251-300 = 7 units, 301-350 = 9 units, > 350 = 11 units call MD if CBG > 400 05/12/22  Yes Adhikari, Amrit, MD  insulin glargine (LANTUS) 100 UNIT/ML Solostar Pen Inject 12 Units into the skin at bedtime. Patient taking differently: Inject 10 Units into the skin at bedtime. 05/12/22  Yes Burnadette Pop, MD  ipratropium (ATROVENT) 0.06 % nasal spray Place 2 sprays into both nostrils 2 (two) times daily.   Yes [provider]  isosorbide dinitrate (ISORDIL) 40 MG tablet Take 40 mg by mouth 3 (three) times daily. 11/06/22  Yes [provider]  levETIRAcetam (KEPPRA) 500 MG tablet Take 1 tablet (500 mg total) by mouth 2 (two) times daily. 05/12/22  Yes Burnadette Pop, MD  loratadine (CLARITIN)  10 MG tablet Take 10 mg by mouth every other day.   Yes [provider]  losartan (COZAAR) 100 MG tablet Take 1 tablet (100 mg total) by mouth daily. 05/12/22  Yes Burnadette Pop, MD  Multiple Vitamin (MULTIVITAMIN WITH MINERALS) TABS tablet Take 1 tablet by mouth daily. 12/06/21  Yes Briant Cedar, MD  niacin 100 MG tablet Take 100 mg by mouth 2 (two) times daily.   Yes [provider]  sertraline (ZOLOFT) 50 MG tablet Take 50 mg by mouth daily. 04/17/22  Yes [provider]  sodium bicarbonate 650 MG tablet Take 1,300 mg by mouth 2 (two) times daily.   Yes [provider]  spironolactone (ALDACTONE) 25 MG tablet Take 1 tablet (25 mg total) by mouth daily. 05/13/22  Yes Burnadette Pop, MD  torsemide 40 MG TABS Take 40 mg by mouth daily. 05/12/22  Yes Adhikari,  Amrit, MD    Physical Exam: Vitals:   11/08/22 1030 11/08/22 1045 11/08/22 1300 11/08/22 1355  BP: (!) 162/74 (!) 157/101 (!) 149/75 (!) 153/78  Pulse: 83 81 75 75  Resp: (!) 27 (!) 22 18 19   Temp:    98.8 F (37.1 C)  TempSrc:    Axillary  SpO2: 98% 97% 97% 96%   Physical Exam Vitals and nursing note reviewed.  Constitutional:      General: He is not in acute distress.    Appearance: Normal appearance. He is ill-appearing.     Comments: Chronically ill-appearing.  HENT:     Head: Normocephalic.     Nose: No rhinorrhea.     Mouth/Throat:     Mouth: Mucous membranes are moist.  Eyes:     General: No scleral icterus.    Pupils: Pupils are equal, round, and reactive to light.  Cardiovascular:     Rate and Rhythm: Normal rate and regular rhythm.     Heart sounds: S1 normal and S2 normal.  Abdominal:     General: Bowel sounds are normal.     Palpations: Abdomen is soft.  Musculoskeletal:     Cervical back: Neck supple.     Right lower leg: 2+ Pitting Edema present.     Left lower leg: 2+ Pitting Edema present.  Neurological:     Mental Status: He is alert and easily aroused.  Mental status is at baseline.  Psychiatric:        Behavior: Behavior is cooperative.   Data Reviewed:  Results are pending, will review when available.  05/10/2022 echocardiogram IMPRESSIONS    1. Left ventricular ejection fraction, by estimation, is 60 to 65%. The  left ventricle has normal function. The left ventricle has no regional  wall motion abnormalities. There is mild concentric left ventricular  hypertrophy. Left ventricular diastolic  parameters are consistent with Grade I diastolic dysfunction (impaired  relaxation).   2. Right ventricular systolic function is normal. The right ventricular  size is normal. Tricuspid regurgitation signal is inadequate for assessing  PA pressure.   3. The mitral valve is normal in structure. Mild mitral valve  regurgitation. No evidence of mitral stenosis.   4. The aortic valve is tricuspid. There is moderate thickening of the  aortic valve. Aortic valve regurgitation is mild. Aortic valve  sclerosis/calcification is present, without any evidence of aortic  stenosis. Aortic regurgitation PHT measures 713 msec.   5. The inferior vena cava is normal in size with greater than 50%  respiratory variability, suggesting right atrial pressure of 3 mmHg.   Assessment and Plan: Principal Problem:   Sepsis due to undetermined organism [facility]) Likely due to pneumonia. Also has abnormal urine analysis. Admit to PCU/inpatient. As needed supplemental oxygen. Scheduled and as needed bronchodilators. Continue ceftriaxone 1 g IVPB daily. Continue azithromycin 500 mg IVPB daily. Check strep pneumoniae urinary antigen. Check sputum Gram stain, culture and sensitivity. Follow-up blood culture and sensitivity. Follow-up urine culture and sensitivity. Follow-up CBC and chemistry in the morning. Will obtain CT chest without contrast to further characterize.  Active Problems:   Acute renal failure superimposed on stage 3b chronic kidney disease  ([place]) Currently having CHF. Defer IV fluids for now. Hold ARB/ACE. Hold diuretic. Avoid hypotension. Avoid nephrotoxins. Monitor intake and output. Monitor renal function electrolytes.    Acute on chronic diastolic congestive heart failure ([place]) Supplemental oxygen as needed. Sodium and fluid restriction. Hold diuretics for now due to AKI. Hold losartan 100  mg p.o. daily. Resume carvedilol in the morning. Continue ESRD L 40 mg p.o. 3 times daily. Monitor daily weights, intake and output. Monitor renal function electrolytes. Check echocardiogram.    Essential hypertension  Resume carvedilol tomorrow morning. Decrease clonidine to 0.3 mg p.o. to twice daily. Continue amlodipine 10 mg p.o. daily. Continue hydralazine 100 mg p.o. every 8 hours. Holding diuretics and ARB due to AKI.    Type 2 diabetes mellitus with complication,  with long-term current use of insulin ([place]) Presented with   Hypoglycemia Carbohydrate modified diet. CBG monitoring before meals and bedtime. Hold long-acting and regular insulin for now.    Depression with anxiety Continue sertraline 50 mg p.o. daily.    Hyperlipidemia Continue atorvastatin 40 mg p.o. daily.    Anemia of chronic disease Monitor hematocrit and hemoglobin.    Diabetic neuropathy (HCC) Continue gabapentin 100 mg p.o. at bedtime.    Seizure disorder (HCC) Continue levetiracetam 500 mg p.o. twice daily. Seizure precautions. Neurochecks every 4 hours x 4.    History of CVA (cerebrovascular accident)  Continue clopidogrel 75 mg p.o. daily. Continue antihypertensives and atorvastatin as above.    Advance Care Planning:   Code Status: Full Code   Consults:   Family Communication:   Severity of Illness: The appropriate patient status for this patient is INPATIENT. Inpatient status is judged to be reasonable and necessary in order to provide the required intensity of service to ensure the patient's safety. The patient's  presenting symptoms, physical exam findings, and initial radiographic and laboratory data in the context of their chronic comorbidities is felt to place them at high risk for further clinical deterioration. Furthermore, it is not anticipated that the patient will be medically stable for discharge from the hospital within 2 midnights of admission.   * I certify that at the point of admission it is my clinical judgment that the patient will require inpatient hospital care spanning beyond 2 midnights from the point of admission due to high intensity of service, high risk for further deterioration and high frequency of surveillance required.*  Author: Reubin Milan, MD 11/08/2022 2:54 PM  For on call review [email or web address].   This document was prepared using Dragon voice recognition software and may contain some unintended transcription errors.

## 2022-11-08 NOTE — Progress Notes (Signed)
Per RN, pt to unstable to go to CT. RN states to plan exam for 11/09/22

## 2022-11-08 NOTE — ED Triage Notes (Signed)
Pt here from [place] home  with c/o feever and being slightly lethargic , pt awake  on arrival , cbg 94 , c/o cough , congestion

## 2022-11-08 NOTE — ED Notes (Signed)
ED TO INPATIENT HANDOFF REPORT  ED Nurse Name and Phone #: Haywood Lasso   S Name/Age/Gender Manuel Kramer 67 y.o. male Room/Bed: WA04/WA04  Code Status   Code Status: Full Code  Home/SNF/Other Rehab Patient oriented to: self Is this baseline? No   Triage Complete: Triage complete  Chief Complaint Sepsis due to undetermined organism [facility]) [A41.9]  Triage Note Pt [facility]  with c/o feever and being slightly lethargic , pt awake  on arrival , cbg 94 , c/o cough , congestion    Allergies Allergies  Allergen Reactions   Codeine Other (See Comments)    On MAR, unknown reaction   Iodinated Contrast Media Other (See Comments)    On MAR, unknown reaction   Pentazocine Other (See Comments)    On MAR, unknown reaction   Statins Other (See Comments)    On MAR, unknown reaction    Level of Care/Admitting Diagnosis ED Disposition     ED Disposition  Admit   Condition  --   [place]: [place] [[id number]]  Level of Care: Telemetry [5]  Admit to tele based on following criteria: Other see comments  Comments: Sepsis/ CHF hX  May admit patient to [facility] or [facility] if equivalent level of care is available:: No  Covid Evaluation: Confirmed COVID Negative  Diagnosis: Sepsis due to undetermined organism [facility]) [[id number]]  Admitting Physician: Reubin Milan [[id number]]  Attending Physician: Reubin Milan [[id number]]  Certification:: I certify this patient will need inpatient services for at least 2 midnights  Estimated Length of Stay: 2          B Medical/Surgery History Past Medical History:  Diagnosis Date   Chronic diarrhea    Diabetes mellitus without complication ([place])    Hypertension    Past Surgical History:  Procedure Laterality Date   COLOSCOPY      POLYP     A IV Location/Drains/Wounds Patient Lines/Drains/Airways Status     Active Line/Drains/Airways     Name Placement date  Placement time Site Days   Peripheral IV 11/08/22 18 G Left Antecubital 11/08/22  1000  Antecubital  less than 1   Pressure Injury 05/07/22 Thigh Posterior;Proximal;Right Stage 2 -  Partial thickness loss of dermis presenting as a shallow open injury with a red, pink wound bed without slough. 05/07/22  2200  -- 185   Pressure Injury 05/07/22 Coccyx Medial;Left;Right Stage 2 -  Partial thickness loss of dermis presenting as a shallow open injury with a red, pink wound bed without slough. 05/07/22  2200  -- 185   Wound / Incision (Open or Dehisced) 01/27/22 (MASD) Moisture Associated Skin Damage Sacrum 01/27/22  2300  Sacrum  285   Wound / Incision (Open or Dehisced) 05/07/22 Irritant Dermatitis (Moisture Associated Skin Damage) Buttocks Posterior 05/07/22  2200  Buttocks  185   Wound / Incision (Open or Dehisced) 05/07/22 Irritant Dermatitis (Moisture Associated Skin Damage) Groin Bilateral 05/07/22  2200  Groin  185            Intake/Output Last 24 hours  Intake/Output Summary (Last 24 hours) at 11/08/2022 1615 Last data filed at 11/08/2022 1227 Gross per 24 hour  Intake 600 ml  Output --  Net 600 ml    Labs/Imaging Results for orders placed or performed during the hospital encounter of 11/08/22 (from the past 48 hour(s))  Resp panel by RT-PCR (RSV, Flu A&B, Covid) Anterior Nasal Swab  Status: None   Collection Time: 11/08/22  9:57 AM   Specimen: Anterior Nasal Swab  Result Value Ref Range   SARS Coronavirus 2 by RT PCR NEGATIVE NEGATIVE    Comment: (NOTE) SARS-CoV-2 target nucleic acids are NOT DETECTED.  The SARS-CoV-2 RNA is generally detectable in upper respiratory specimens during the acute phase of infection. The lowest concentration of SARS-CoV-2 viral copies this assay can detect is 138 copies/mL. A negative result does not preclude SARS-Cov-2 infection and should not be used as the sole basis for treatment or other patient management decisions. A negative result may  occur with  improper specimen collection/handling, submission of specimen other than nasopharyngeal swab, presence of viral mutation(s) within the areas targeted by this assay, and inadequate number of viral copies(<138 copies/mL). A negative result must be combined with clinical observations, patient history, and epidemiological information. The expected result is Negative.  Fact Sheet for Patients:  BloggerCourse.com  Fact Sheet for Healthcare Providers:  SeriousBroker.it  This test is no t yet approved or cleared by the [place] FDA and  has been authorized for detection and/or diagnosis of SARS-CoV-2 by FDA under an Emergency Use Authorization (EUA). This EUA will remain  in effect (meaning this test can be used) for the duration of the COVID-19 declaration under Section 564(b)(1) of the Act, 21 U.S.C.section 360bbb-3(b)(1), unless the authorization is terminated  or revoked sooner.       Influenza A by PCR NEGATIVE NEGATIVE   Influenza B by PCR NEGATIVE NEGATIVE    Comment: (NOTE) The Xpert Xpress SARS-CoV-2/FLU/RSV plus assay is intended as an aid in the diagnosis of influenza from Nasopharyngeal swab specimens and should not be used as a sole basis for treatment. Nasal washings and aspirates are unacceptable for Xpert Xpress SARS-CoV-2/FLU/RSV testing.  Fact Sheet for Patients: BloggerCourse.com  Fact Sheet for Healthcare Providers: SeriousBroker.it  This test is not yet approved or cleared by the [place] FDA and has been authorized for detection and/or diagnosis of SARS-CoV-2 by FDA under an Emergency Use Authorization (EUA). This EUA will remain in effect (meaning this test can be used) for the duration of the COVID-19 declaration under Section 564(b)(1) of the Act, 21 U.S.C. section 360bbb-3(b)(1), unless the authorization is terminated or revoked.      Resp Syncytial Virus by PCR NEGATIVE NEGATIVE    Comment: (NOTE) Fact Sheet for Patients: BloggerCourse.com  Fact Sheet for Healthcare Providers: SeriousBroker.it  This test is not yet approved or cleared by the [place] FDA and has been authorized for detection and/or diagnosis of SARS-CoV-2 by FDA under an Emergency Use Authorization (EUA). This EUA will remain in effect (meaning this test can be used) for the duration of the COVID-19 declaration under Section 564(b)(1) of the Act, 21 U.S.C. section 360bbb-3(b)(1), unless the authorization is terminated or revoked.  Performed at [facility], [address], [place], [place] [address]   POC CBG, ED     Status: None   Collection Time: 11/08/22 10:05 AM  Result Value Ref Range   Glucose-Capillary 72 70 - 99 mg/dL    Comment: Glucose reference range applies only to samples taken after fasting for at least 8 hours.  CBC with Differential     Status: Abnormal   Collection Time: 11/08/22 10:30 AM  Result Value Ref Range   WBC 12.2 (H) 4.0 - 10.5 K/uL   RBC 2.84 (L) 4.22 - 5.81 MIL/uL   Hemoglobin 8.7 (L) 13.0 - 17.0 g/dL  HCT 27.7 (L) 39.0 - 52.0 %   MCV 97.5 80.0 - 100.0 fL   MCH 30.6 26.0 - 34.0 pg   MCHC 31.4 30.0 - 36.0 g/dL   RDW 22.6 33.3 - 54.5 %   Platelets 224 150 - 400 K/uL   nRBC 0.0 0.0 - 0.2 %   Neutrophils Relative % 92 %    Comment: REPEATED TO VERIFY Predominantly Neutrophils Seen    Neutro Abs 11.2 (H) 1.7 - 7.7 K/uL   Lymphocytes Relative 5 %   Lymphs Abs 0.6 (L) 0.7 - 4.0 K/uL   Monocytes Relative 3 %   Monocytes Absolute 0.3 0.1 - 1.0 K/uL   Eosinophils Relative 0 %   Eosinophils Absolute 0.0 0.0 - 0.5 K/uL   Basophils Relative 0 %   Basophils Absolute 0.0 0.0 - 0.1 K/uL   Immature Granulocytes 0 %   Abs Immature Granulocytes 0.02 0.00 - 0.07 K/uL    Comment: Performed at [facility], [address]  [place], [place] [address]  Comprehensive metabolic panel     Status: Abnormal   Collection Time: 11/08/22 10:30 AM  Result Value Ref Range   Sodium 141 135 - 145 mmol/L   Potassium 3.5 3.5 - 5.1 mmol/L   Chloride 107 98 - 111 mmol/L   CO2 26 22 - 32 mmol/L   Glucose, Bld 63 (L) 70 - 99 mg/dL    Comment: Glucose reference range applies only to samples taken after fasting for at least 8 hours.   BUN 40 (H) 8 - 23 mg/dL   Creatinine, Ser 8.93 (H) 0.61 - 1.24 mg/dL   Calcium 8.1 (L) 8.9 - 10.3 mg/dL   Total Protein 5.5 (L) 6.5 - 8.1 g/dL   Albumin 2.4 (L) 3.5 - 5.0 g/dL   AST 29 15 - 41 U/L   ALT 29 0 - 44 U/L   Alkaline Phosphatase 62 38 - 126 U/L   Total Bilirubin 0.6 0.3 - 1.2 mg/dL   GFR, Estimated 18 (L) >60 mL/min    Comment: (NOTE) Calculated using the CKD-EPI Creatinine Equation (2021)    Anion gap 8 5 - 15    Comment: Performed at [facility], [address], [place], [place] [address]  Protime-INR     Status: None   Collection Time: 11/08/22 10:30 AM  Result Value Ref Range   Prothrombin Time 14.7 11.4 - 15.2 seconds   INR 1.2 0.8 - 1.2    Comment: (NOTE) INR goal varies based on device and disease states. Performed at [facility], [address], [place], [place] [address]   APTT     Status: None   Collection Time: 11/08/22 10:30 AM  Result Value Ref Range   aPTT 36 24 - 36 seconds    Comment: Performed at [facility], [address] [place], [place] [address]  Brain natriuretic peptide     Status: Abnormal   Collection Time: 11/08/22 10:30 AM  Result Value Ref Range   B Natriuretic Peptide 2,183.5 (H) 0.0 - 100.0 pg/mL    Comment: Performed at [facility], [address], [place], [place] [address]  Magnesium     Status: None   Collection Time: 11/08/22 10:30 AM  Result Value Ref Range   Magnesium 1.9 1.7 - 2.4 mg/dL    Comment: Performed at [facility], [address] [place], [place] [address]  Phosphorus     Status:  None   Collection Time: 11/08/22 10:30 AM  Result Value Ref Range   Phosphorus 3.6 2.5 - 4.6 mg/dL    Comment: Performed at [facility], [address] [place], [place] [address]  Lactic acid, plasma     Status: Abnormal   Collection Time: 11/08/22 10:33 AM  Result Value Ref Range   Lactic Acid, Venous 2.1 (HH) 0.5 - 1.9 mmol/L    Comment: CRITICAL RESULT CALLED TO, READ BACK BY AND VERIFIED WITH [place], D. RN AT 1139 ON 11/08/2022  BY MECIAL J. Performed at [facility], [address] [place], [place] [address]   CBG monitoring, ED     Status: Abnormal   Collection Time: 11/08/22  2:41 PM  Result Value Ref Range   Glucose-Capillary 162 (H) 70 - 99 mg/dL    Comment: Glucose reference range applies only to samples taken after fasting for at least 8 hours.  Lactic acid, plasma     Status: None   Collection Time: 11/08/22  3:01 PM  Result Value Ref Range   Lactic Acid, Venous 1.4 0.5 - 1.9 mmol/L    Comment: Performed at [facility], [address] [place], [place] [address]   DG Chest Port 1 View  Result Date: 11/08/2022 CLINICAL DATA:  Shortness of breath and weakness EXAM: PORTABLE CHEST 1 VIEW COMPARISON:  X-ray 05/07/2022.  CT 05/08/2022.  Older exams as well. FINDINGS: Enlarged cardiopericardial silhouette with vascular congestion mild opacity. No pneumothorax, effusion. Overlapping cardiac leads. IMPRESSION: Enlarged cardiopericardial silhouette with diffuse vascular congestion. Electronically Signed   By: Karen Kays M.D.   On: 11/08/2022 10:30    Pending Labs Unresulted Labs (From admission, onward)     Start     Ordered   11/09/22 0500  CBC with Differential  Daily,   R      11/08/22 1530   11/09/22 0500  Comprehensive metabolic panel  Tomorrow morning,   R        11/08/22 1530   11/08/22 1534  Expectorated Sputum Assessment w Gram Stain, Rflx to  Resp Cult  (COPD / Pneumonia / Cellulitis / Lower Extremity Wound)  Once,   R        11/08/22 1535   11/08/22 1534  Strep pneumoniae urinary antigen  (COPD / Pneumonia / Cellulitis / Lower Extremity Wound)  Once,   R        11/08/22 1535   11/08/22 1033  Blood Culture (routine x 2)  (Undifferentiated presentation (screening labs and basic nursing orders))  BLOOD CULTURE X 2,   STAT      11/08/22 1032   11/08/22 1033  Urine Culture  (Undifferentiated presentation (screening labs and basic nursing orders))  ONCE - URGENT,   URGENT       Question:  Indication  Answer:  Dysuria   11/08/22 1032   11/08/22 1016  Urinalysis, Routine w reflex microscopic Urine, In & Out Cath  Once,   URGENT        11/08/22 1016            Vitals/Pain Today's Vitals   11/08/22 1045 11/08/22 1227 11/08/22 1300 11/08/22 1355  BP: (!) 157/101  (!) 149/75 (!) 153/78  Pulse: 81  75 75  Resp: (!) 22  18 19   Temp:    98.8 F (37.1 C)  TempSrc:    Axillary  SpO2: 97%  97% 96%  PainSc:  Asleep      Isolation  Precautions Airborne and Contact precautions  Medications Medications  cefTRIAXone (ROCEPHIN) 1 g in sodium chloride 0.9 % 100 mL IVPB (has no administration in time range)  azithromycin (ZITHROMAX) 500 mg in sodium chloride 0.9 % 250 mL IVPB (has no administration in time range)  acetaminophen (TYLENOL) tablet 650 mg (has no administration in time range)    Or  acetaminophen (TYLENOL) suppository 650 mg (has no administration in time range)  ondansetron (ZOFRAN) tablet 4 mg (has no administration in time range)    Or  ondansetron (ZOFRAN) injection 4 mg (has no administration in time range)  albuterol (PROVENTIL) (2.5 MG/3ML) 0.083% nebulizer solution 2.5 mg (has no administration in time range)  amLODipine (NORVASC) tablet 10 mg (has no administration in time range)  aspirin EC tablet 81 mg (has no administration in time range)  atorvastatin (LIPITOR) tablet 40 mg (has no administration in time  range)  carvedilol (COREG) tablet 25 mg (has no administration in time range)  sodium bicarbonate tablet 1,300 mg (has no administration in time range)  sertraline (ZOLOFT) tablet 50 mg (has no administration in time range)  levETIRAcetam (KEPPRA) tablet 500 mg (has no administration in time range)  isosorbide dinitrate (ISORDIL) tablet 40 mg (has no administration in time range)  clopidogrel (PLAVIX) tablet 75 mg (has no administration in time range)  cloNIDine (CATAPRES) tablet 0.3 mg (has no administration in time range)  cholecalciferol (VITAMIN D3) 25 MCG (1000 UNIT) tablet 2,000 Units (has no administration in time range)  gabapentin (NEURONTIN) capsule 100 mg (has no administration in time range)  hydrALAZINE (APRESOLINE) tablet 100 mg (has no administration in time range)  acetaminophen (TYLENOL) tablet 650 mg (650 mg Oral Given 11/08/22 1049)  sodium chloride 0.9 % bolus 500 mL (0 mLs Intravenous Stopped 11/08/22 1227)  cefTRIAXone (ROCEPHIN) 2 g in sodium chloride 0.9 % 100 mL IVPB (0 g Intravenous Stopped 11/08/22 1227)  dextrose 50 % solution 25 mL (25 mLs Intravenous Given 11/08/22 1351)    Mobility walks with device High fall risk   Focused Assessments    R Recommendations: See Admitting Provider Note  Report given to:   Additional Notes:

## 2022-11-09 ENCOUNTER — Inpatient Hospital Stay (HOSPITAL_COMMUNITY): Payer: Medicare Other

## 2022-11-09 DIAGNOSIS — E162 Hypoglycemia, unspecified: Secondary | ICD-10-CM

## 2022-11-09 DIAGNOSIS — N179 Acute kidney failure, unspecified: Secondary | ICD-10-CM

## 2022-11-09 DIAGNOSIS — Z8673 Personal history of transient ischemic attack (TIA), and cerebral infarction without residual deficits: Secondary | ICD-10-CM

## 2022-11-09 DIAGNOSIS — N1832 Chronic kidney disease, stage 3b: Secondary | ICD-10-CM

## 2022-11-09 DIAGNOSIS — E1149 Type 2 diabetes mellitus with other diabetic neurological complication: Secondary | ICD-10-CM

## 2022-11-09 DIAGNOSIS — F418 Other specified anxiety disorders: Secondary | ICD-10-CM

## 2022-11-09 DIAGNOSIS — I10 Essential (primary) hypertension: Secondary | ICD-10-CM

## 2022-11-09 DIAGNOSIS — I5033 Acute on chronic diastolic (congestive) heart failure: Secondary | ICD-10-CM | POA: Diagnosis not present

## 2022-11-09 DIAGNOSIS — D638 Anemia in other chronic diseases classified elsewhere: Secondary | ICD-10-CM

## 2022-11-09 DIAGNOSIS — G40909 Epilepsy, unspecified, not intractable, without status epilepticus: Secondary | ICD-10-CM | POA: Diagnosis not present

## 2022-11-09 DIAGNOSIS — A419 Sepsis, unspecified organism: Secondary | ICD-10-CM | POA: Diagnosis not present

## 2022-11-09 DIAGNOSIS — E785 Hyperlipidemia, unspecified: Secondary | ICD-10-CM

## 2022-11-09 DIAGNOSIS — E118 Type 2 diabetes mellitus with unspecified complications: Secondary | ICD-10-CM

## 2022-11-09 DIAGNOSIS — Z794 Long term (current) use of insulin: Secondary | ICD-10-CM

## 2022-11-09 LAB — BLOOD CULTURE ID PANEL (REFLEXED) - BCID2

## 2022-11-09 LAB — CBC WITH DIFFERENTIAL/PLATELET
Abs Immature Granulocytes: 0.51 10*3/uL — ABNORMAL HIGH (ref 0.00–0.07)
Basophils Absolute: 0 10*3/uL (ref 0.0–0.1)
Basophils Relative: 0 %
Eosinophils Absolute: 0 10*3/uL (ref 0.0–0.5)
Eosinophils Relative: 0 %
HCT: 24.7 % — ABNORMAL LOW (ref 39.0–52.0)
Hemoglobin: 7.6 g/dL — ABNORMAL LOW (ref 13.0–17.0)
Immature Granulocytes: 4 %
Lymphocytes Relative: 5 %
Lymphs Abs: 0.8 10*3/uL (ref 0.7–4.0)
MCH: 30.3 pg (ref 26.0–34.0)
MCHC: 30.8 g/dL (ref 30.0–36.0)
MCV: 98.4 fL (ref 80.0–100.0)
Monocytes Absolute: 0.7 10*3/uL (ref 0.1–1.0)
Monocytes Relative: 5 %
Neutro Abs: 12.5 10*3/uL — ABNORMAL HIGH (ref 1.7–7.7)
Neutrophils Relative %: 86 %
Platelets: 180 10*3/uL (ref 150–400)
RBC: 2.51 MIL/uL — ABNORMAL LOW (ref 4.22–5.81)
RDW: 15.5 % (ref 11.5–15.5)
WBC: 14.5 10*3/uL — ABNORMAL HIGH (ref 4.0–10.5)
nRBC: 0 % (ref 0.0–0.2)

## 2022-11-09 LAB — STREP PNEUMONIAE URINARY ANTIGEN: Strep Pneumo Urinary Antigen: NEGATIVE

## 2022-11-09 LAB — URINE CULTURE: Culture: NO GROWTH

## 2022-11-09 LAB — HEMOGLOBIN A1C
Hgb A1c MFr Bld: 6 % — ABNORMAL HIGH (ref 4.8–5.6)
Mean Plasma Glucose: 125.5 mg/dL

## 2022-11-09 LAB — ECHOCARDIOGRAM COMPLETE
AR max vel: 2.01 cm2
AV Area VTI: 2.07 cm2
AV Area mean vel: 1.94 cm2
AV Mean grad: 7.3 mmHg
AV Peak grad: 14.1 mmHg
Ao pk vel: 1.88 m/s
Area-P 1/2: 4.39 cm2
Calc EF: 49.8 %
Height: 66 in
MV M vel: 3.92 m/s
MV Peak grad: 61.6 mmHg
P 1/2 time: 308 msec
S' Lateral: 3.7 cm
Single Plane A2C EF: 51.5 %
Single Plane A4C EF: 51.8 %
Weight: 2630.4 oz

## 2022-11-09 LAB — GLUCOSE, CAPILLARY
Glucose-Capillary: 118 mg/dL — ABNORMAL HIGH (ref 70–99)
Glucose-Capillary: 129 mg/dL — ABNORMAL HIGH (ref 70–99)
Glucose-Capillary: 147 mg/dL — ABNORMAL HIGH (ref 70–99)
Glucose-Capillary: 175 mg/dL — ABNORMAL HIGH (ref 70–99)
Glucose-Capillary: 189 mg/dL — ABNORMAL HIGH (ref 70–99)
Glucose-Capillary: 198 mg/dL — ABNORMAL HIGH (ref 70–99)

## 2022-11-09 LAB — COMPREHENSIVE METABOLIC PANEL
ALT: 30 U/L (ref 0–44)
AST: 28 U/L (ref 15–41)
Albumin: 2.1 g/dL — ABNORMAL LOW (ref 3.5–5.0)
Alkaline Phosphatase: 59 U/L (ref 38–126)
Anion gap: 8 (ref 5–15)
BUN: 46 mg/dL — ABNORMAL HIGH (ref 8–23)
CO2: 24 mmol/L (ref 22–32)
Calcium: 7.9 mg/dL — ABNORMAL LOW (ref 8.9–10.3)
Chloride: 109 mmol/L (ref 98–111)
Creatinine, Ser: 3.94 mg/dL — ABNORMAL HIGH (ref 0.61–1.24)
GFR, Estimated: 16 mL/min — ABNORMAL LOW (ref >60)
Glucose, Bld: 132 mg/dL — ABNORMAL HIGH (ref 70–99)
Potassium: 3.6 mmol/L (ref 3.5–5.1)
Sodium: 141 mmol/L (ref 135–145)
Total Bilirubin: 0.3 mg/dL (ref 0.3–1.2)
Total Protein: 5.2 g/dL — ABNORMAL LOW (ref 6.5–8.1)

## 2022-11-09 MED ORDER — LINEZOLID 600 MG/300ML IV SOLN
600.0000 mg | Freq: Two times a day (BID) | INTRAVENOUS | Status: DC
  Administered 2022-11-09 – 2022-11-12 (×7): 600 mg via INTRAVENOUS
  Filled 2022-11-09 (×7): qty 300

## 2022-11-09 NOTE — Progress Notes (Signed)
PROGRESS NOTE    Manuel Kramer  GOT:[id number] DOB: [birth year] DOA: 11/08/2022 PCP: Vonna Drafts, FNP    Brief Narrative:  Manuel Kramer is a 67 y.o. male with past medical history significant of chronic diarrhea, history of C. 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  In the ED, patient was febrile with a temperature of 101 F and was mildly hypertensive.  Urinalysis showed glycosuria proteinuria more than 50 white cells with budding yeast.  He did have mild leukocytosis at 12.2.  Coronavirus influenza and RSV PCR was negative but BNP was elevated at 2183.  Lactate was elevated at 2.1 followed by 1.4.  Creatinine elevated at 3.6.  Portable chest x-ray showed enlarged pericardial silhouette with diffuse vascular congestion.  Patient was then given IV fluid bolus ceftriaxone and was considered for admission to the hospital for further workup and treatment.   Assessment and plan.  Sepsis likely due to pneumonia. Patient with abnormal urinalysis as well.  Patient is on Rocephin and Zithromax.  Strep urinary antigen negative.  Sputum for Gram stain culture sensitivity follow-up  urine cultures.  Blood cultures showing methicillin-resistant staph epidermidis.  WBC at 14.5 today.  CT scan of the chest has been ordered.  Will add Zyvox for now.  Patient was febrile with temperature max of 100.9 F.     Acute renal failure superimposed on stage 3b chronic kidney disease  Clinically he had congestive heart failure.  Will hold off with diuretic ACE ARB, monitor intake and output charting, Daily weights.  Closely monitor  renal function.  Creatinine today at 3.9 from 3.6.  Will continue to monitor closely.    Acute on chronic diastolic congestive heart failure  Continue intake and output charting, daily weights.  Hold diuretic and losartan for now.  Check 2D echocardiogram.  Continue Coreg.    Essential hypertension  Continue Coreg, clonidine dose has been decreased.  Continue amlodipine hydralazine.  Holding diuretics and ARB.     Type 2 diabetes mellitus with complication,  with long-term current use of insulin Patient presented with hypoglycemia.  Hold long-acting insulin for now.  Carbohydrate controlled diet.  Latest POC glucose of 129.    Depression with anxiety Continue sertraline.     Hyperlipidemia Continue Lipitor     Anemia of chronic disease Monitor hematocrit and hemoglobin.  Latest hemoglobin of 7.6.  No mention of bleeding.     Diabetic neuropathy (HCC) Continue gabapentin     Seizure disorder ([place]) Continue Keppra.  Seizure precautions.  Neurochecks.     History of CVA (cerebrovascular accident)  Continue Plavix Lipitor.    DVT prophylaxis: SCDs Start: 11/08/22 1534   Code Status:     Code Status: Full Code  Disposition: Skilled nursing facility, patient is from skilled nursing facility.  Status is: Inpatient Remains inpatient appropriate because: Bacteremia, IV antibiotic, sepsis   Family Communication: None at bedside  Consultants:  None  Procedures:  None  Antimicrobials:  Rocephin and Zithromax  Anti-infectives (From admission, onward)    Start     Dose/Rate Route Frequency Ordered Stop   11/09/22 1400  cefTRIAXone (ROCEPHIN) 1 g in sodium chloride 0.9 % 100 mL IVPB  1 g 200 mL/hr over 30 Minutes Intravenous Every 24 hours 11/08/22 1530 11/13/22 1359   11/08/22 1545  azithromycin (ZITHROMAX) 500 mg in sodium chloride 0.9 % 250 mL IVPB        500 mg 250 mL/hr over 60 Minutes Intravenous Every 24 hours 11/08/22 1530 11/13/22 1544   11/08/22 1045   cefTRIAXone (ROCEPHIN) 2 g in sodium chloride 0.9 % 100 mL IVPB        2 g 200 mL/hr over 30 Minutes Intravenous  Once 11/08/22 1036 11/08/22 1227      Subjective: Today, patient was seen and examined at bedside.  More alert awake as per the nursing staff but completely disoriented and confused.  Unable to obtain much information.  Objective: Vitals:   11/08/22 1937 11/08/22 2339 11/09/22 0400 11/09/22 0500  BP: (!) 149/81 (!) 136/108 (!) 147/79   Pulse: 78 74 73   Resp: 17 20 20    Temp: (!) 100.9 F (38.3 C) 98.7 F (37.1 C) 99.6 F (37.6 C)   TempSrc: Oral Oral Oral   SpO2: 97% 97% 99%   Weight:    74.6 kg  Height:        Intake/Output Summary (Last 24 hours) at 11/09/2022 1054 Last data filed at 11/09/2022 0500 Gross per 24 hour  Intake 600 ml  Output 150 ml  Net 450 ml   Filed Weights   11/08/22 1616 11/09/22 0500  Weight: 68.3 kg 74.6 kg    Physical Examination: Body mass index is 26.53 kg/m.  General:  Average built, not in obvious distress, alert awake and Communicative but disoriented, appears chronically ill HENT:   No scleral pallor or icterus noted. Oral mucosa is moist.  Chest:  Clear breath sounds.  Diminished breath sounds bilaterally. No crackles or wheezes.  CVS: S1 &S2 heard. No murmur.  Regular rate and rhythm. Abdomen: Soft, nontender, nondistended.  Bowel sounds are heard.   Extremities: No cyanosis, clubbing with bilateral lower extremity edema, peripheral pulses are palpable. Psych: Alert, awake and Communicative but disoriented CNS: Alert awake and Communicative, disoriented moves extremities. Skin: Warm and dry.  No rashes noted.  Data Reviewed:   CBC: Recent Labs  Lab 11/08/22 1030 11/09/22 0534  WBC 12.2* 14.5*  NEUTROABS 11.2* 12.5*  HGB 8.7* 7.6*  HCT 27.7* 24.7*  MCV 97.5 98.4  PLT 224 180    Basic Metabolic Panel: Recent Labs  Lab 11/08/22 1030 11/09/22 0534  NA 141 141  K 3.5 3.6  CL 107 109  CO2 26 24  GLUCOSE 63*  132*  BUN 40* 46*  CREATININE 3.65* 3.94*  CALCIUM 8.1* 7.9*  MG 1.9  --   PHOS 3.6  --     Liver Function Tests: Recent Labs  Lab 11/08/22 1030 11/09/22 0534  AST 29 28  ALT 29 30  ALKPHOS 62 59  BILITOT 0.6 0.3  PROT 5.5* 5.2*  ALBUMIN 2.4* 2.1*     Radiology Studies: ECHOCARDIOGRAM COMPLETE  Result Date: 11/09/2022    ECHOCARDIOGRAM REPORT   Patient Name:   BRIANA NEWMAN Date of Exam: 11/09/2022 Medical Rec #:  [id number]        Height:       66.0 in Accession #:    [id number]       Weight:       164.4 lb Date of Birth:  29-Dec-1955        BSA:          1.840 m Patient Age:  66 years         BP:           147/79 mmHg Patient Gender: M                HR:           73 bpm. Exam Location:  Inpatient Procedure: 2D Echo Indications:    CHF  History:        Patient has prior history of Echocardiogram examinations, most                 recent 05/10/2022. Stroke; Risk Factors:Dyslipidemia and                 Diabetes.  Sonographer:    Harvie Junior Referring Phys: [id number] [place]  1. Left ventricular ejection fraction, by estimation, is 60 to 65%. The left ventricle has normal function. The left ventricle has no regional wall motion abnormalities. There is severe concentric left ventricular hypertrophy. Left ventricular diastolic  parameters are consistent with Grade II diastolic dysfunction (pseudonormalization).  2. Right ventricular systolic function is normal. The right ventricular size is normal. The estimated right ventricular systolic pressure is 32.2 mmHg.  3. Left atrial size was mild to moderately dilated.  4. The mitral valve is grossly normal. Mild mitral valve regurgitation.  5. The aortic valve is tricuspid. There is moderate calcification of the aortic valve. There is severe thickening of the aortic valve. Aortic valve regurgitation is mild to moderate. Aortic valve sclerosis/calcification is present, without any evidence of aortic stenosis. Aortic  regurgitation PHT measures 308 msec. Aortic valve area, by VTI measures 2.07 cm. Aortic valve mean gradient measures 7.3 mmHg. Aortic valve Vmax measures 1.88 m/s.  6. The inferior vena cava is dilated in size with <50% respiratory variability, suggesting right atrial pressure of 15 mmHg. Comparison(s): Compared to prior TTE on 04/2022, the filling pressures are elevated on current study. FINDINGS  Left Ventricle: Left ventricular ejection fraction, by estimation, is 60 to 65%. The left ventricle has normal function. The left ventricle has no regional wall motion abnormalities. The left ventricular internal cavity size was normal in size. There is  severe concentric left ventricular hypertrophy. Left ventricular diastolic parameters are consistent with Grade II diastolic dysfunction (pseudonormalization). Right Ventricle: The right ventricular size is normal. No increase in right ventricular wall thickness. Right ventricular systolic function is normal. The tricuspid regurgitant velocity is 2.96 m/s, and with an assumed right atrial pressure of 15 mmHg, the estimated right ventricular systolic pressure is 02.5 mmHg. Left Atrium: Left atrial size was mild to moderately dilated. Right Atrium: Right atrial size was normal in size. Pericardium: There is no evidence of pericardial effusion. Mitral Valve: The mitral valve is grossly normal. There is mild thickening of the mitral valve leaflet(s). There is mild calcification of the mitral valve leaflet(s). Mild to moderate mitral annular calcification. Mild mitral valve regurgitation. Tricuspid Valve: The tricuspid valve is normal in structure. Tricuspid valve regurgitation is mild. Aortic Valve: The aortic valve is tricuspid. There is moderate calcification of the aortic valve. There is severe thickening of the aortic valve. Aortic valve regurgitation is mild to moderate. Aortic regurgitation PHT measures 308 msec. Aortic valve sclerosis/calcification is present, without  any evidence of aortic stenosis. Aortic valve mean gradient measures 7.3 mmHg. Aortic valve peak gradient measures 14.1 mmHg. Aortic valve area, by VTI measures 2.07 cm. Pulmonic Valve: The pulmonic valve was normal in structure. Pulmonic valve regurgitation is trivial.  Aorta: The aortic root is normal in size and structure. Venous: The inferior vena cava is dilated in size with less than 50% respiratory variability, suggesting right atrial pressure of 15 mmHg. IAS/Shunts: The atrial septum is grossly normal.  LEFT VENTRICLE PLAX 2D LVIDd:         5.00 cm      Diastology LVIDs:         3.70 cm      LV e' medial:    5.98 cm/s LV PW:         1.50 cm      LV E/e' medial:  17.2 LV IVS:        1.40 cm      LV e' lateral:   7.40 cm/s LVOT diam:     1.90 cm      LV E/e' lateral: 13.9 LV SV:         74 LV SV Index:   40 LVOT Area:     2.84 cm  LV Volumes (MOD) LV vol d, MOD A2C: 137.0 ml LV vol d, MOD A4C: 171.0 ml LV vol s, MOD A2C: 66.4 ml LV vol s, MOD A4C: 82.4 ml LV SV MOD A2C:     70.6 ml LV SV MOD A4C:     171.0 ml LV SV MOD BP:      76.1 ml RIGHT VENTRICLE RV Basal diam:  4.00 cm RV Mid diam:    3.10 cm RV S prime:     11.90 cm/s TAPSE (M-mode): 1.7 cm LEFT ATRIUM           Index        RIGHT ATRIUM           Index LA diam:      3.60 cm 1.96 cm/m   RA Area:     13.30 cm LA Vol (A2C): 61.7 ml 33.53 ml/m  RA Volume:   27.50 ml  14.95 ml/m LA Vol (A4C): 63.5 ml 34.51 ml/m  AORTIC VALVE                     PULMONIC VALVE AV Area (Vmax):    2.01 cm      PV Vmax:          1.30 m/s AV Area (Vmean):   1.94 cm      PV Peak grad:     6.8 mmHg AV Area (VTI):     2.07 cm      PR End Diast Vel: 4.24 msec AV Vmax:           188.00 cm/s AV Vmean:          126.667 cm/s AV VTI:            0.357 m AV Peak Grad:      14.1 mmHg AV Mean Grad:      7.3 mmHg LVOT Vmax:         133.00 cm/s LVOT Vmean:        86.500 cm/s LVOT VTI:          0.261 m LVOT/AV VTI ratio: 0.73 AI PHT:            308 msec  AORTA Ao Root diam: 3.50 cm Ao  Asc diam:  2.80 cm MITRAL VALVE                TRICUSPID VALVE MV Area (PHT): 4.39 cm     TR Peak grad:   35.0 mmHg  MV Decel Time: 173 msec     TR Vmax:        296.00 cm/s MR Peak grad: 61.6 mmHg MR Vmax:      392.33 cm/s   SHUNTS MV E velocity: 103.00 cm/s  Systemic VTI:  0.26 m MV A velocity: 76.30 cm/s   Systemic Diam: 1.90 cm MV E/A ratio:  1.35 Laurance Flatten MD Electronically signed by Laurance Flatten MD Signature Date/Time: 11/09/2022/9:38:14 AM    Final    DG Chest Port 1 View  Result Date: 11/08/2022 CLINICAL DATA:  Shortness of breath and weakness EXAM: PORTABLE CHEST 1 VIEW COMPARISON:  X-ray 05/07/2022.  CT 05/08/2022.  Older exams as well. FINDINGS: Enlarged cardiopericardial silhouette with vascular congestion mild opacity. No pneumothorax, effusion. Overlapping cardiac leads. IMPRESSION: Enlarged cardiopericardial silhouette with diffuse vascular congestion. Electronically Signed   By: Karen Kays M.D.   On: 11/08/2022 10:30      LOS: 1 day   Joycelyn Das, MD [facility] Available via Epic secure chat 7am-7pm After these hours, please refer to coverage provider listed on [email or web address] 11/09/2022, 10:54 AM

## 2022-11-09 NOTE — Progress Notes (Signed)
PROGRESS NOTE    Manuel Kramer  [id number] DOB: 1956-04-14 DOA: 11/08/2022 PCP: Vonna Drafts, FNP    Brief Narrative:  Manuel Kramer is a 67 y.o. male with past medical history significant of chronic diarrhea, history of C. 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  In the ED, patient was febrile with a temperature of 101 F and was mildly hypertensive.  Urinalysis showed glycosuria proteinuria more than 50 white cells with budding yeast.  He did have mild leukocytosis at 12.2.  Coronavirus influenza and RSV PCR was negative but BNP was elevated at 2183.  Lactate was elevated at 2.1 followed by 1.4.  Creatinine elevated at 3.6.  Portable chest x-ray showed enlarged pericardial silhouette with diffuse vascular congestion.  Patient was then given IV fluid bolus ceftriaxone and was considered for admission to the hospital for further workup and treatment.   Assessment and plan.  Sepsis likely due to pneumonia. Patient with abnormal urinalysis as well.  Patient is on Rocephin and Zithromax.  Strep urinary antigen negative.  Sputum for Gram stain culture sensitivity follow-up  urine cultures.  Blood cultures showing methicillin-resistant staph epidermidis.  WBC at 14.5 today.  CT scan of the chest has been ordered.  Will add Zyvox for now.  Patient was febrile with temperature max of 100.9 F.     Acute renal failure superimposed on stage 3b chronic kidney disease  Clinically he had congestive heart failure.  Will hold off with diuretic ACE ARB, monitor intake and output charting, Daily weights.  Closely monitor  renal function.  Creatinine today at 3.9 from 3.6.  Will continue to monitor closely.    Acute on chronic diastolic congestive heart failure  Continue intake and output charting, daily weights.  Hold diuretic and losartan for now.  Check 2D echocardiogram.  Continue Coreg.    Essential hypertension  Continue Coreg, clonidine dose has been decreased.  Continue amlodipine hydralazine.  Holding diuretics and ARB.     Type 2 diabetes mellitus with complication,  with long-term current use of insulin Patient presented with hypoglycemia.  Hold long-acting insulin for now.  Carbohydrate controlled diet.  Latest POC glucose of 129.    Depression with anxiety Continue sertraline.     Hyperlipidemia Continue Lipitor     Anemia of chronic disease Monitor hematocrit and hemoglobin.  Latest hemoglobin of 7.6.  No mention of bleeding.     Diabetic neuropathy (HCC) Continue gabapentin     Seizure disorder ([place]) Continue Keppra.  Seizure precautions.  Neurochecks.     History of CVA (cerebrovascular accident)  Continue Plavix Lipitor.    DVT prophylaxis: SCDs Start: 11/08/22 1534   Code Status:     Code Status: Full Code  Disposition: Skilled nursing facility, patient is from skilled nursing facility.  Status is: Inpatient Remains inpatient appropriate because: Bacteremia, IV antibiotic, sepsis   Family Communication: None at bedside.  I tried to reach the patient's daughter and son on the phone but was unable to reach them today.  Consultants:  None  Procedures:  None  Antimicrobials:  Rocephin and Zithromax  Anti-infectives (From admission, onward)    Start     Dose/Rate Route Frequency Ordered Stop  11/09/22 1400  cefTRIAXone (ROCEPHIN) 1 g in sodium chloride 0.9 % 100 mL IVPB        1 g 200 mL/hr over 30 Minutes Intravenous Every 24 hours 11/08/22 1530 11/13/22 1359   11/09/22 1145  linezolid (ZYVOX) IVPB 600 mg        600 mg 300 mL/hr over 60 Minutes Intravenous  Every 12 hours 11/09/22 1056     11/08/22 1545  azithromycin (ZITHROMAX) 500 mg in sodium chloride 0.9 % 250 mL IVPB        500 mg 250 mL/hr over 60 Minutes Intravenous Every 24 hours 11/08/22 1530 11/13/22 1544   11/08/22 1045  cefTRIAXone (ROCEPHIN) 2 g in sodium chloride 0.9 % 100 mL IVPB        2 g 200 mL/hr over 30 Minutes Intravenous  Once 11/08/22 1036 11/08/22 1227      Subjective: Today, patient was seen and examined at bedside.  More alert awake as per the nursing staff but completely disoriented and confused.  Unable to obtain much information.  Objective: Vitals:   11/08/22 2339 11/09/22 0400 11/09/22 0500 11/09/22 1210  BP: (!) 136/108 (!) 147/79  (!) 155/91  Pulse: 74 73  76  Resp: 20 20  16   Temp: 98.7 F (37.1 C) 99.6 F (37.6 C)  (!) 100.5 F (38.1 C)  TempSrc: Oral Oral    SpO2: 97% 99%  98%  Weight:   74.6 kg   Height:        Intake/Output Summary (Last 24 hours) at 11/09/2022 1315 Last data filed at 11/09/2022 0500 Gross per 24 hour  Intake --  Output 150 ml  Net -150 ml    Filed Weights   11/08/22 1616 11/09/22 0500  Weight: 68.3 kg 74.6 kg    Physical Examination: Body mass index is 26.53 kg/m.  General:  Average built, not in obvious distress, alert awake and Communicative but disoriented, appears chronically ill HENT:   No scleral pallor or icterus noted. Oral mucosa is moist.  Chest:  Clear breath sounds.  Diminished breath sounds bilaterally. No crackles or wheezes.  CVS: S1 &S2 heard. No murmur.  Regular rate and rhythm. Abdomen: Soft, nontender, nondistended.  Bowel sounds are heard.   Extremities: No cyanosis, clubbing with bilateral lower extremity edema, peripheral pulses are palpable. Psych: Alert, awake and Communicative but disoriented CNS: Alert awake and Communicative, disoriented moves extremities. Skin: Warm and dry.  No rashes noted.  Data Reviewed:   CBC: Recent Labs  Lab 11/08/22 1030 11/09/22 0534  WBC 12.2* 14.5*   NEUTROABS 11.2* 12.5*  HGB 8.7* 7.6*  HCT 27.7* 24.7*  MCV 97.5 98.4  PLT 224 180     Basic Metabolic Panel: Recent Labs  Lab 11/08/22 1030 11/09/22 0534  NA 141 141  K 3.5 3.6  CL 107 109  CO2 26 24  GLUCOSE 63* 132*  BUN 40* 46*  CREATININE 3.65* 3.94*  CALCIUM 8.1* 7.9*  MG 1.9  --   PHOS 3.6  --      Liver Function Tests: Recent Labs  Lab 11/08/22 1030 11/09/22 0534  AST 29 28  ALT 29 30  ALKPHOS 62 59  BILITOT 0.6 0.3  PROT 5.5* 5.2*  ALBUMIN 2.4* 2.1*      Radiology Studies: CT CHEST WO CONTRAST  Result Date: 11/09/2022 CLINICAL DATA:  Respiratory illness.  Fever and productive cough. EXAM: CT CHEST WITHOUT CONTRAST TECHNIQUE: Multidetector CT imaging of the chest was performed following the standard  protocol without IV contrast. RADIATION DOSE REDUCTION: This exam was performed according to the departmental dose-optimization program which includes automated exposure control, adjustment of the mA and/or kV according to patient size and/or use of iterative reconstruction technique. COMPARISON:  X-ray 11/08/2022.  CT 05/08/2022.  Older exams as well FINDINGS: Cardiovascular: The heart is enlarged. Small pericardial effusion. Normal caliber thoracic aorta with some vascular calcifications including along the coronary arteries. Please correlate with other coronary risk factors. There is some enlargement of the main pulmonary arteries. Please correlate for pulmonary artery hypertension. Mediastinum/Nodes: Slightly patulous thoracic esophagus. There may be some debris along the upper thoracic esophagus as well. On this non IV contrast exam there is no specific abnormal lymph node enlargement seen in the axillary region, hilum or mediastinum. A few small less than 1 cm in size nodes are seen, not pathologic by size criteria. Lungs/Pleura: Small right and tiny left pleural effusion identified. Extensive breathing motion throughout the examination. Diffuse bilateral  perihilar lung opacities are identified, slightly right greater than left. Infiltrate versus edema is possible. There is some interstitial septal thickening. No pneumothorax. The ill-defined lung opacities are new from prior. Upper Abdomen: The adrenal glands are incompletely included in the imaging field. There is some air noted overlying the central left hepatic lobe. This could represent air in the biliary tree but indeterminate on this examination. Please correlate with history. Musculoskeletal: Mild degenerative changes along the spine. Anasarca. Focal lipoma anterior to the sternum on image 59 of series 2. IMPRESSION: Small right and tiny left pleural effusion.  Mild adjacent opacity. Diffuse perihilar hazy ground-glass opacities with interstitial septal thickening. The heart is also enlarged. Please correlate for evidence of edema. Recommend follow-up. Anasarca. Slightly patulous esophagus with some debris in the upper thoracic esophagus. Please correlate with symptoms. Area of air noted within the liver on the left side. This could be within the biliary tree but indeterminate. Please correlate for any history and dedicated evaluation when appropriate such as abdominal imaging. Preferably with contrast. Patient does have provided history of possible sepsis. Coronary artery calcifications. Please correlate for other coronary risk factors. Aortic Atherosclerosis (ICD10-I70.0). Electronically Signed   By: Jill Side M.D.   On: 11/09/2022 12:09   ECHOCARDIOGRAM COMPLETE  Result Date: 11/09/2022    ECHOCARDIOGRAM REPORT   Patient Name:   ZAK GONDEK Date of Exam: 11/09/2022 Medical Rec #:  [id number]        Height:       66.0 in Accession #:    [id number]       Weight:       164.4 lb Date of Birth:  [birth year]        BSA:          1.840 m Patient Age:    75 years         BP:           147/79 mmHg Patient Gender: M                HR:           73 bpm. Exam Location:  Inpatient Procedure: 2D Echo  Indications:    CHF  History:        Patient has prior history of Echocardiogram examinations, most                 recent 05/10/2022. Stroke; Risk Factors:Dyslipidemia and  Diabetes.  Sonographer:    Harvie Junior Referring Phys: [id number] [place]  1. Left ventricular ejection fraction, by estimation, is 60 to 65%. The left ventricle has normal function. The left ventricle has no regional wall motion abnormalities. There is severe concentric left ventricular hypertrophy. Left ventricular diastolic  parameters are consistent with Grade II diastolic dysfunction (pseudonormalization).  2. Right ventricular systolic function is normal. The right ventricular size is normal. The estimated right ventricular systolic pressure is Q000111Q mmHg.  3. Left atrial size was mild to moderately dilated.  4. The mitral valve is grossly normal. Mild mitral valve regurgitation.  5. The aortic valve is tricuspid. There is moderate calcification of the aortic valve. There is severe thickening of the aortic valve. Aortic valve regurgitation is mild to moderate. Aortic valve sclerosis/calcification is present, without any evidence of aortic stenosis. Aortic regurgitation PHT measures 308 msec. Aortic valve area, by VTI measures 2.07 cm. Aortic valve mean gradient measures 7.3 mmHg. Aortic valve Vmax measures 1.88 m/s.  6. The inferior vena cava is dilated in size with <50% respiratory variability, suggesting right atrial pressure of 15 mmHg. Comparison(s): Compared to prior TTE on 04/2022, the filling pressures are elevated on current study. FINDINGS  Left Ventricle: Left ventricular ejection fraction, by estimation, is 60 to 65%. The left ventricle has normal function. The left ventricle has no regional wall motion abnormalities. The left ventricular internal cavity size was normal in size. There is  severe concentric left ventricular hypertrophy. Left ventricular diastolic parameters are consistent with  Grade II diastolic dysfunction (pseudonormalization). Right Ventricle: The right ventricular size is normal. No increase in right ventricular wall thickness. Right ventricular systolic function is normal. The tricuspid regurgitant velocity is 2.96 m/s, and with an assumed right atrial pressure of 15 mmHg, the estimated right ventricular systolic pressure is Q000111Q mmHg. Left Atrium: Left atrial size was mild to moderately dilated. Right Atrium: Right atrial size was normal in size. Pericardium: There is no evidence of pericardial effusion. Mitral Valve: The mitral valve is grossly normal. There is mild thickening of the mitral valve leaflet(s). There is mild calcification of the mitral valve leaflet(s). Mild to moderate mitral annular calcification. Mild mitral valve regurgitation. Tricuspid Valve: The tricuspid valve is normal in structure. Tricuspid valve regurgitation is mild. Aortic Valve: The aortic valve is tricuspid. There is moderate calcification of the aortic valve. There is severe thickening of the aortic valve. Aortic valve regurgitation is mild to moderate. Aortic regurgitation PHT measures 308 msec. Aortic valve sclerosis/calcification is present, without any evidence of aortic stenosis. Aortic valve mean gradient measures 7.3 mmHg. Aortic valve peak gradient measures 14.1 mmHg. Aortic valve area, by VTI measures 2.07 cm. Pulmonic Valve: The pulmonic valve was normal in structure. Pulmonic valve regurgitation is trivial. Aorta: The aortic root is normal in size and structure. Venous: The inferior vena cava is dilated in size with less than 50% respiratory variability, suggesting right atrial pressure of 15 mmHg. IAS/Shunts: The atrial septum is grossly normal.  LEFT VENTRICLE PLAX 2D LVIDd:         5.00 cm      Diastology LVIDs:         3.70 cm      LV e' medial:    5.98 cm/s LV PW:         1.50 cm      LV E/e' medial:  17.2 LV IVS:        1.40 cm  LV e' lateral:   7.40 cm/s LVOT diam:     1.90 cm       LV E/e' lateral: 13.9 LV SV:         74 LV SV Index:   40 LVOT Area:     2.84 cm  LV Volumes (MOD) LV vol d, MOD A2C: 137.0 ml LV vol d, MOD A4C: 171.0 ml LV vol s, MOD A2C: 66.4 ml LV vol s, MOD A4C: 82.4 ml LV SV MOD A2C:     70.6 ml LV SV MOD A4C:     171.0 ml LV SV MOD BP:      76.1 ml RIGHT VENTRICLE RV Basal diam:  4.00 cm RV Mid diam:    3.10 cm RV S prime:     11.90 cm/s TAPSE (M-mode): 1.7 cm LEFT ATRIUM           Index        RIGHT ATRIUM           Index LA diam:      3.60 cm 1.96 cm/m   RA Area:     13.30 cm LA Vol (A2C): 61.7 ml 33.53 ml/m  RA Volume:   27.50 ml  14.95 ml/m LA Vol (A4C): 63.5 ml 34.51 ml/m  AORTIC VALVE                     PULMONIC VALVE AV Area (Vmax):    2.01 cm      PV Vmax:          1.30 m/s AV Area (Vmean):   1.94 cm      PV Peak grad:     6.8 mmHg AV Area (VTI):     2.07 cm      PR End Diast Vel: 4.24 msec AV Vmax:           188.00 cm/s AV Vmean:          126.667 cm/s AV VTI:            0.357 m AV Peak Grad:      14.1 mmHg AV Mean Grad:      7.3 mmHg LVOT Vmax:         133.00 cm/s LVOT Vmean:        86.500 cm/s LVOT VTI:          0.261 m LVOT/AV VTI ratio: 0.73 AI PHT:            308 msec  AORTA Ao Root diam: 3.50 cm Ao Asc diam:  2.80 cm MITRAL VALVE                TRICUSPID VALVE MV Area (PHT): 4.39 cm     TR Peak grad:   35.0 mmHg MV Decel Time: 173 msec     TR Vmax:        296.00 cm/s MR Peak grad: 61.6 mmHg MR Vmax:      392.33 cm/s   SHUNTS MV E velocity: 103.00 cm/s  Systemic VTI:  0.26 m MV A velocity: 76.30 cm/s   Systemic Diam: 1.90 cm MV E/A ratio:  1.35 Gwyndolyn Kaufman MD Electronically signed by Gwyndolyn Kaufman MD Signature Date/Time: 11/09/2022/9:38:14 AM    Final    DG Chest Port 1 View  Result Date: 11/08/2022 CLINICAL DATA:  Shortness of breath and weakness EXAM: PORTABLE CHEST 1 VIEW COMPARISON:  X-ray 05/07/2022.  CT 05/08/2022.  Older exams as well. FINDINGS: Enlarged cardiopericardial silhouette with  vascular congestion mild opacity. No  pneumothorax, effusion. Overlapping cardiac leads. IMPRESSION: Enlarged cardiopericardial silhouette with diffuse vascular congestion. Electronically Signed   By: Karen Kays M.D.   On: 11/08/2022 10:30      LOS: 1 day   Joycelyn Das, MD [facility] Available via Epic secure chat 7am-7pm After these hours, please refer to coverage provider listed on [email or web address] 11/09/2022, 1:15 PM

## 2022-11-09 NOTE — Progress Notes (Signed)
SLP Cancellation Note  Patient Details Name: Manuel Kramer MRN: [id number] DOB: [birth year]   Cancelled treatment:       Reason Eval/Treat Not Completed: Other (comment)  Pt being taken for CT of his chest when SLP arrived to room. Will follow up this pm.   Kathleen Lime, MS [facility] SLP Acute Rehab Services Office [phone] Pager [phone]    Macario Golds 11/09/2022, 2:00 PM

## 2022-11-09 NOTE — TOC Initial Note (Signed)
Transition of Care [facility]) - Initial/Assessment Note    Patient Details  Name: Manuel Kramer MRN: [id number] Date of Birth: Jun 06, 1956  [facility] CM/SW Contact:    Leeroy Cha, RN Phone Number: 11/09/2022, 9:20 AM  Clinical Narrative:                 Current smoker information for smoking cessation added to the dc instructions.  Expected Discharge Plan: Home/Self Care Barriers to Discharge: Continued Medical Work up   Patient Goals and CMS Choice Patient states their goals for this hospitalization and ongoing recovery are:: to go home and get well CMS Medicare.gov Compare Post Acute Care list provided to:: Patient Choice offered to / list presented to : Patient      Expected Discharge Plan and Services   Discharge Planning Services: CM Consult   Living arrangements for the past 2 months: Apartment                                      Prior Living Arrangements/Services Living arrangements for the past 2 months: Apartment Lives with:: Self Patient language and need for interpreter reviewed:: Yes Do you feel safe going back to the place where you live?: Yes            Criminal Activity/Legal Involvement Pertinent to Current Situation/Hospitalization: No - Comment as needed  Activities of Daily Living      Permission Sought/Granted                  Emotional Assessment Appearance:: Appears stated age Attitude/Demeanor/Rapport: Engaged Affect (typically observed): Appropriate Orientation: : Oriented to Self, Oriented to Place, Oriented to  Time, Oriented to Situation Alcohol / Substance Use: Tobacco Use Psych Involvement: No (comment)  Admission diagnosis:  AKI (acute kidney injury) ([place]) [N17.9] Febrile illness [R50.9] Sepsis due to undetermined organism [facility]) [A41.9] Patient Active Problem List   Diagnosis Date Noted   Sepsis due to undetermined organism ([place]) 11/08/2022   Acute on chronic diastolic congestive heart  failure ([place]) 11/08/2022   Hypoglycemia 11/08/2022   Essential hypertension 11/08/2022   History of CVA (cerebrovascular accident) 11/08/2022   Acute ischemic stroke ([place]) > small acute to subacute pontine infarct seen on MRI 05/10/2022   Seizure disorder ([place]) 60/73/7106   Acute metabolic encephalopathy 26/94/8546   Pressure injury of skin 05/08/2022   Aspiration pneumonia ([place]) 05/08/2022   C. difficile colitis 01/26/2022   Acute renal failure superimposed on stage 3b chronic kidney disease ([place]) 01/25/2022   Bilateral lower extremity edema 01/25/2022   Type 2 diabetes mellitus with complication, with long-term current use of insulin ([place]) 01/25/2022   Depression with anxiety 01/25/2022   Physical deconditioning 01/25/2022   Left hip pain 01/25/2022   Anemia of chronic disease 01/25/2022   Chronic diastolic CHF (congestive heart failure) ([place]) 01/25/2022   Cerebral embolism with cerebral infarction 11/17/2021   Protein-calorie malnutrition, severe 11/16/2021   AKI (acute kidney injury) ([place])    Hyperglycemia    Hypertensive emergency without congestive heart failure    Sepsis ([place])    Hyperlipidemia 05/22/2016   Diabetic neuropathy ([place]) 05/22/2016   Other specified diabetes mellitus without complications ([place]) 27/12/5007   Hypertensive emergency 10/02/2014   Smoking 10/02/2014   Family history of prostate cancer 10/02/2014   Renal insufficiency 10/02/2014   PCP:  Vonna Drafts, FNP Pharmacy:   [facility] [address], [place] [address] Phone: [phone] Fax: [place], [place] - [address] [place] [address] Phone: [phone] Fax: [phone]  [id number] - [place], [place] [address] [place] [address] Phone: [phone] Fax: [phone]     Social  Determinants of Health (SDOH) Social History: SDOH Screenings   Tobacco Use: High Risk (11/08/2022)   SDOH Interventions:     Readmission Risk Interventions   No data to display

## 2022-11-09 NOTE — Progress Notes (Signed)
  Echocardiogram 2D Echocardiogram has been performed.  Manuel Kramer 11/09/2022, 9:22 AM

## 2022-11-09 NOTE — Evaluation (Signed)
Clinical/Bedside Swallow Evaluation Patient Details  Name: Manuel Kramer MRN: [id number] Date of Birth: 03-22-1956  Today's Date: 11/09/2022 Time: SLP Start Time (ACUTE ONLY): 1640 SLP Stop Time (ACUTE ONLY): 9622 SLP Time Calculation (min) (ACUTE ONLY): 38 min  Past Medical History:  Past Medical History:  Diagnosis Date   Chronic diarrhea    Diabetes mellitus without complication ([employer])    Hypertension    Past Surgical History:  Past Surgical History:  Procedure Laterality Date   COLOSCOPY      POLYP   HPI:  Manuel Kramer is a 67 y.o. male with past medical history significant of chronic diarrhea, history of C. 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   Swallow eval ordered as pt was not able to orally transit liquid earlier in the day- allowing them to spill from his mouth.  Chest imaging showed "Slightly patulous esophagus with some debris in the upper thoracic  esophagus. Please correlate with symptoms.     Area of air noted within the liver on the left side. This could be  within the biliary tree but indeterminate. Please correlate for any  history and dedicated evaluation when appropriate such as abdominal  imaging. Preferably with contrast. Patient does have provided  history of possible sepsis.     Coronary artery calcifications. Please correlate for other coronary  risk factors."  Pt was seen by SLP during admission in early 2023- initially started on puree/thin diet due to oral difficulties.    Assessment / Plan / Recommendation  Clinical Impression  Patient presents with cognitive based oral  dysphagia - and concern for likely esophageal component given finding on imaging showing  "Slightly patulous esophagus with some debris in the upper thoracic  esophagus."  Prolonged oral transiting, presumed premature spillage of thin water into airway x2 of 8 boluses producing reflexive coughing.  Difficulty masticating very soft solid noted - requiring 33 seconds to clear  despite cues.  For now, recommend nectar thick liquids - full liquid and thin water between meals. SLP will follow up tomorrow to further assess - and gather more premorbid information from family given pt's current cognitive status. He was not oriented to location nor situation and did not consistently follow directions. He is polite and is able to phonate - frequent throat clearing also observed but pt does not follow directions to cough and clear - therefore does not cease throat clearing once triggered.  Pt may benefit form instrumental swallow evaluation dependent on premorbid status - and po tolerance given referral form in hard chart indicates pt transferred to ED due to nausea/coffee ground emesis. SLP Visit Diagnosis: Dysphagia, oral phase (R13.11);Dysphagia, unspecified (R13.10)    Aspiration Risk  Mild aspiration risk    Diet Recommendation Nectar-thick liquid;Other (Comment) (full liquids, thin water between meals via dixie cup)   Liquid Administration via: Cup;Straw Medication Administration: Crushed with puree Supervision: Staff to assist with self feeding Compensations: Slow rate;Small sips/bites Postural Changes: Remain upright for at least 30 minutes after po intake;Seated upright at 90 degrees    Other  Recommendations Oral Care Recommendations: Oral care BID Other Recommendations: Have oral suction available    Recommendations for follow up therapy are one  component of a multi-disciplinary discharge planning process, led by the attending physician.  Recommendations may be updated based on patient status,  additional functional criteria and insurance authorization.  Follow up Recommendations    TBD     Assistance Recommended at Discharge  Full assist   Functional Status Assessment Patient has had a recent decline in their functional status and demonstrates the ability to make significant improvements in function in a reasonable and predictable amount of time.  Frequency and Duration min 1 x/week  1 week       Prognosis Prognosis for Safe Diet Advancement: Good Barriers to Reach Goals: Severity of deficits      Swallow Study   General Date of Onset: 11/09/22 HPI: Manuel Kramer is a 67 y.o. male with past medical history significant of chronic diarrhea, history of C. 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   Swallow eval ordered as pt was not able to orally transit liquid earlier in the day- allowing them to spill from his mouth.  Chest imaging showed "Slightly patulous esophagus with some debris in the upper thoracic  esophagus. Please correlate with symptoms.     Area of air noted within the liver on the left side. This could be  within the biliary tree but indeterminate. Please correlate for any  history and dedicated evaluation when appropriate such as abdominal  imaging. Preferably with contrast. Patient does have provided  history of possible sepsis.     Coronary artery calcifications. Please correlate for other coronary  risk factors."  Pt was seen by SLP during admission in early 2023- initially started on puree/thin diet due to oral difficulties. Type of Study: Bedside Swallow Evaluation Previous Swallow  Assessment: bse early 2023 Diet Prior to this Study: NPO Temperature Spikes Noted: No Respiratory Status: Room air History of Recent Intubation: No Behavior/Cognition: Alert;Doesn't follow directions Oral Cavity Assessment: Dried secretions Oral Care Completed by SLP: Yes Oral Cavity - Dentition: Edentulous Vision: Impaired for self-feeding Self-Feeding Abilities: Needs assist Patient Positioning: Upright in bed Baseline Vocal Quality: Normal Volitional Cough: Weak Volitional Swallow: Able to elicit    Oral/Motor/Sensory Function Overall Oral Motor/Sensory Function: Generalized oral weakness   Ice Chips Ice chips: Impaired Presentation: Spoon Pharyngeal Phase Impairments: Suspected delayed Swallow   Thin Liquid Thin Liquid: Impaired Presentation: Cup;Spoon;Straw;Self Fed Oral Phase Impairments: Reduced labial seal Pharyngeal  Phase Impairments: Cough - Immediate;Throat Clearing - Immediate    Nectar Thick Nectar Thick Liquid: Impaired Presentation: Cup;Straw Oral Phase Impairments: Reduced labial seal Pharyngeal Phase Impairments: Suspected delayed Swallow   Honey Thick Honey Thick Liquid: Not tested   Puree Puree: Within functional limits Presentation: Self Fed;Spoon   Solid     Solid: Impaired Oral Phase Impairments: Reduced labial seal;Reduced lingual movement/coordination;Impaired mastication Oral Phase Functional Implications: Impaired mastication Other Comments: swallow initiation observed at 33 seconds with solids, even with verbal and visual cues      Macario Golds 11/09/2022,5:19 PM  Kathleen Lime, MS [place] Office [phone] Pager [phone]

## 2022-11-09 NOTE — Progress Notes (Signed)
       CROSS COVER NOTE  NAME: PRINCESTON BLIZZARD MRN: [id number] DOB : [birth year]    Date of Service   11/09/2022   HPI/Events of Note   Notified by bedside RN of hypoglycemic episode, CBG 69.    RN attempted to give oral fluids, however patient is on able to tolerate.  Patient has failed bedside swallow evaluation.  RN to keep patient n.p.o. for now.   Current CBG now 77.  RN to give 1/2 amp of D50.  Patient will be started on D10 gtt. at 25 cc/h.  CBG monitoring every 4.    Interventions/ Plan   N.p.o. D10 gtt. with CBG every 4 monitoring       Raenette Rover, DNP, [place]

## 2022-11-10 DIAGNOSIS — E118 Type 2 diabetes mellitus with unspecified complications: Secondary | ICD-10-CM | POA: Diagnosis not present

## 2022-11-10 DIAGNOSIS — A419 Sepsis, unspecified organism: Secondary | ICD-10-CM | POA: Diagnosis not present

## 2022-11-10 DIAGNOSIS — N179 Acute kidney failure, unspecified: Secondary | ICD-10-CM | POA: Diagnosis not present

## 2022-11-10 DIAGNOSIS — G40909 Epilepsy, unspecified, not intractable, without status epilepticus: Secondary | ICD-10-CM | POA: Diagnosis not present

## 2022-11-10 LAB — CBC WITH DIFFERENTIAL/PLATELET
Abs Immature Granulocytes: 0.37 10*3/uL — ABNORMAL HIGH (ref 0.00–0.07)
Basophils Absolute: 0 10*3/uL (ref 0.0–0.1)
Basophils Relative: 0 %
Eosinophils Absolute: 0 10*3/uL (ref 0.0–0.5)
Eosinophils Relative: 0 %
HCT: 22.2 % — ABNORMAL LOW (ref 39.0–52.0)
Hemoglobin: 7.1 g/dL — ABNORMAL LOW (ref 13.0–17.0)
Immature Granulocytes: 2 %
Lymphocytes Relative: 5 %
Lymphs Abs: 0.9 10*3/uL (ref 0.7–4.0)
MCH: 30.9 pg (ref 26.0–34.0)
MCHC: 32 g/dL (ref 30.0–36.0)
MCV: 96.5 fL (ref 80.0–100.0)
Monocytes Absolute: 1.1 10*3/uL — ABNORMAL HIGH (ref 0.1–1.0)
Monocytes Relative: 6 %
Neutro Abs: 16.3 10*3/uL — ABNORMAL HIGH (ref 1.7–7.7)
Neutrophils Relative %: 87 %
Platelets: 170 10*3/uL (ref 150–400)
RBC: 2.3 MIL/uL — ABNORMAL LOW (ref 4.22–5.81)
RDW: 15.5 % (ref 11.5–15.5)
WBC: 18.7 10*3/uL — ABNORMAL HIGH (ref 4.0–10.5)
nRBC: 0 % (ref 0.0–0.2)

## 2022-11-10 LAB — BASIC METABOLIC PANEL
Anion gap: 11 (ref 5–15)
BUN: 59 mg/dL — ABNORMAL HIGH (ref 8–23)
CO2: 22 mmol/L (ref 22–32)
Calcium: 7.7 mg/dL — ABNORMAL LOW (ref 8.9–10.3)
Chloride: 105 mmol/L (ref 98–111)
Creatinine, Ser: 4.03 mg/dL — ABNORMAL HIGH (ref 0.61–1.24)
GFR, Estimated: 16 mL/min — ABNORMAL LOW (ref >60)
Glucose, Bld: 286 mg/dL — ABNORMAL HIGH (ref 70–99)
Potassium: 3.8 mmol/L (ref 3.5–5.1)
Sodium: 138 mmol/L (ref 135–145)

## 2022-11-10 LAB — GLUCOSE, CAPILLARY
Glucose-Capillary: 166 mg/dL — ABNORMAL HIGH (ref 70–99)
Glucose-Capillary: 184 mg/dL — ABNORMAL HIGH (ref 70–99)
Glucose-Capillary: 242 mg/dL — ABNORMAL HIGH (ref 70–99)
Glucose-Capillary: 264 mg/dL — ABNORMAL HIGH (ref 70–99)
Glucose-Capillary: 272 mg/dL — ABNORMAL HIGH (ref 70–99)
Glucose-Capillary: 289 mg/dL — ABNORMAL HIGH (ref 70–99)
Glucose-Capillary: 303 mg/dL — ABNORMAL HIGH (ref 70–99)

## 2022-11-10 MED ORDER — INSULIN ASPART 100 UNIT/ML IJ SOLN
0.0000 [IU] | Freq: Three times a day (TID) | INTRAMUSCULAR | Status: DC
  Administered 2022-11-11 – 2022-11-12 (×2): 8 [IU] via SUBCUTANEOUS
  Administered 2022-11-12: 11 [IU] via SUBCUTANEOUS

## 2022-11-10 MED ORDER — CLONIDINE HCL 0.2 MG PO TABS
0.3000 mg | ORAL_TABLET | Freq: Three times a day (TID) | ORAL | Status: DC
  Administered 2022-11-10 – 2022-11-13 (×9): 0.3 mg via ORAL
  Filled 2022-11-10 (×9): qty 1

## 2022-11-10 MED ORDER — SODIUM CHLORIDE 0.9 % IV SOLN: INTRAVENOUS | Status: AC

## 2022-11-10 MED ORDER — INSULIN GLARGINE-YFGN 100 UNIT/ML ~~LOC~~ SOLN
12.0000 [IU] | Freq: Every day | SUBCUTANEOUS | Status: DC
  Administered 2022-11-10 – 2022-11-12 (×3): 12 [IU] via SUBCUTANEOUS
  Filled 2022-11-10 (×4): qty 0.12

## 2022-11-10 MED ORDER — IPRATROPIUM BROMIDE 0.06 % NA SOLN
2.0000 | Freq: Two times a day (BID) | NASAL | Status: DC
  Administered 2022-11-11 – 2022-11-13 (×4): 2 via NASAL
  Filled 2022-11-10: qty 15

## 2022-11-10 MED ORDER — INSULIN ASPART 100 UNIT/ML IJ SOLN
0.0000 [IU] | Freq: Every day | INTRAMUSCULAR | Status: DC
  Administered 2022-11-10 – 2022-11-11 (×2): 2 [IU] via SUBCUTANEOUS

## 2022-11-10 NOTE — Plan of Care (Signed)
  Problem: Fluid Volume: Goal: Hemodynamic stability will improve Outcome: Progressing   Problem: Clinical Measurements: Goal: Diagnostic test results will improve Outcome: Progressing Goal: Signs and symptoms of infection will decrease Outcome: Progressing   Problem: Respiratory: Goal: Ability to maintain adequate ventilation will improve Outcome: Progressing   Problem: Activity: Goal: Ability to tolerate increased activity will improve Outcome: Progressing   Problem: Clinical Measurements: Goal: Ability to maintain a body temperature in the normal range will improve Outcome: Progressing   Problem: Respiratory: Goal: Ability to maintain adequate ventilation will improve Outcome: Progressing Goal: Ability to maintain a clear airway will improve Outcome: Progressing   Problem: Education: Goal: Ability to describe self-care measures that may prevent or decrease complications (Diabetes Survival Skills Education) will improve Outcome: Progressing Goal: Individualized Educational Video(s) Outcome: Progressing   Problem: Coping: Goal: Ability to adjust to condition or change in health will improve Outcome: Progressing   Problem: Fluid Volume: Goal: Ability to maintain a balanced intake and output will improve Outcome: Progressing   Problem: Health Behavior/Discharge Planning: Goal: Ability to identify and utilize available resources and services will improve Outcome: Progressing Goal: Ability to manage health-related needs will improve Outcome: Progressing   Problem: Metabolic: Goal: Ability to maintain appropriate glucose levels will improve Outcome: Progressing   Problem: Nutritional: Goal: Maintenance of adequate nutrition will improve Outcome: Progressing Goal: Progress toward achieving an optimal weight will improve Outcome: Progressing   Problem: Skin Integrity: Goal: Risk for impaired skin integrity will decrease Outcome: Progressing   Problem: Tissue  Perfusion: Goal: Adequacy of tissue perfusion will improve Outcome: Progressing   Problem: Education: Goal: Knowledge of General Education information will improve Description: Including pain rating scale, medication(s)/side effects and non-pharmacologic comfort measures Outcome: Progressing   Problem: Health Behavior/Discharge Planning: Goal: Ability to manage health-related needs will improve Outcome: Progressing   Problem: Clinical Measurements: Goal: Ability to maintain clinical measurements within normal limits will improve Outcome: Progressing Goal: Will remain free from infection Outcome: Progressing Goal: Diagnostic test results will improve Outcome: Progressing Goal: Respiratory complications will improve Outcome: Progressing Goal: Cardiovascular complication will be avoided Outcome: Progressing   Problem: Activity: Goal: Risk for activity intolerance will decrease Outcome: Progressing   Problem: Nutrition: Goal: Adequate nutrition will be maintained Outcome: Progressing   Problem: Coping: Goal: Level of anxiety will decrease Outcome: Progressing   Problem: Elimination: Goal: Will not experience complications related to bowel motility Outcome: Progressing Goal: Will not experience complications related to urinary retention Outcome: Progressing   Problem: Pain Managment: Goal: General experience of comfort will improve Outcome: Progressing   Problem: Safety: Goal: Ability to remain free from injury will improve Outcome: Progressing   Problem: Skin Integrity: Goal: Risk for impaired skin integrity will decrease Outcome: Progressing   

## 2022-11-10 NOTE — Progress Notes (Signed)
SLP Cancellation Note  Patient Details Name: BARNELL SHIEH MRN: [id number] DOB: 1956/02/20   Cancelled treatment:       Reason Eval/Treat Not Completed: Other (comment) (pt sleeping this evening and RN reports he tolerated his po today, will continue efforts for diagnostic/treatment) Kathleen Lime, MS [place] Office [phone] Pager [phone]   Macario Golds 11/10/2022, 7:00 PM

## 2022-11-10 NOTE — Progress Notes (Signed)
PROGRESS NOTE    Manuel Kramer  GMW:[id number] DOB: 09/07/1956 DOA: 11/08/2022 PCP: Vonna Drafts, FNP    Brief Narrative:  Manuel Kramer is a 67 y.o. male with past medical history significant of chronic diarrhea, history of C. 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  In the ED, patient was febrile with a temperature of 101 F and was mildly hypertensive.  Urinalysis showed glycosuria proteinuria more than 50 white cells with budding yeast.  He did have mild leukocytosis at 12.2.  Coronavirus influenza and RSV PCR was negative but BNP was elevated at 2183.  Lactate was elevated at 2.1 followed by 1.4.  Creatinine elevated at 3.6.  Portable chest x-ray showed enlarged pericardial silhouette with diffuse vascular congestion.  Patient was then given IV fluid bolus ceftriaxone and was considered for admission to the hospital for further workup and treatment.   Assessment and plan.  Sepsis likely due to pneumonia, staph epidermidis bacteremia.. Patient with abnormal urinalysis as well.  Patient is on Rocephin and Zithromax and added Zyvox.  Strep urinary antigen negative.  Urine culture no growth so far..  Blood cultures showing methicillin-resistant staph epidermidis.  WBC trending up at 18.7 from 14.5 yesterday.  CT scan of the chest shows a small bilateral pleural effusion with diffuse perihilar hazy groundglass opacity with interstitial thickening anasarca.  Patient was febrile with temperature max of 100.5 F.  Will discuss with infectious disease regarding bacteremia leukocytosis with fever.     Acute renal  failure superimposed on stage 3b chronic kidney disease  Currently holding off with torsemide, spironolactone and losartan from home. monitor intake and output charting, Daily weights.  Will check a BMP from today.    Acute on chronic diastolic congestive heart failure  Continue intake and output charting, daily weights.  Hold diuretic and losartan for now.   2D echocardiogram with LV ejection fraction of 60 to 65% with grade 2 diastolic dysfunction..  Continue Coreg.    Essential hypertension  Continue Coreg, will increase clonidine dose again today due to elevated blood pressure.  Continue amlodipine hydralazine, Isordil.  Holding diuretics and ARB.     Type 2 diabetes mellitus with complication,  with long-term current use of insulin Patient presented with hypoglycemia.  Carbohydrate controlled diet.  Latest POC glucose high.  Will resume long-acting insulin from home add sliding scale insulin.  Hold off with mealtime insulin for now.    Depression with anxiety Continue sertraline.     Hyperlipidemia Continue Lipitor     Anemia of chronic disease Monitor hematocrit and hemoglobin.  Latest hemoglobin of 7.1.  No mention of bleeding.  Will transfuse for hemoglobin less than 7.     Diabetic neuropathy with hyperglycemia. Continue gabapentin.       Seizure disorder ([place]) Continue Keppra.  Seizure precautions.  Neurochecks.     History of CVA (cerebrovascular accident)  Continue Plavix Lipitor.    DVT prophylaxis: SCDs Start: 11/08/22 1534   Code Status:     Code Status: Full Code  Disposition: Skilled nursing facility, patient is from skilled nursing facility likely in 1-2 days  Status is: Inpatient  Remains inpatient appropriate because: Bacteremia, IV antibiotic, sepsis, renal failure  Family Communication:  Spoke with the patient's daughter on the phone and updated her about the clinical condition of the patient.  She stated that he only speaks a few words at  baseline.  Consultants:  Will discuss with infectious disease.  Procedures:  None  Antimicrobials:  Rocephin, Zithromax, zyvox  Anti-infectives (From admission, onward)    Start     Dose/Rate Route Frequency Ordered Stop   11/09/22 1400  cefTRIAXone (ROCEPHIN) 1 g in sodium chloride 0.9 % 100 mL IVPB        1 g 200 mL/hr over 30 Minutes Intravenous Every 24 hours 11/08/22 1530 11/13/22 1359   11/09/22 1145  linezolid (ZYVOX) IVPB 600 mg        600 mg 300 mL/hr over 60 Minutes Intravenous Every 12 hours 11/09/22 1056     11/08/22 1545  azithromycin (ZITHROMAX) 500 mg in sodium chloride 0.9 % 250 mL IVPB        500 mg 250 mL/hr over 60 Minutes Intravenous Every 24 hours 11/08/22 1530 11/13/22 1544   11/08/22 1045  cefTRIAXone (ROCEPHIN) 2 g in sodium chloride 0.9 % 100 mL IVPB        2 g 200 mL/hr over 30 Minutes Intravenous  Once 11/08/22 1036 11/08/22 1227      Subjective: Today, patient was seen and examined at bedside.  More alert awake but only oriented to place.  At baseline does not talk much as per the patient's daughter.  Objective: Vitals:   11/09/22 2000 11/09/22 2105 11/10/22 0408 11/10/22 0650  BP: (!) 153/83 (!) 146/84 (!) 145/79 (!) 174/88  Pulse: 78  72   Resp: 20  19   Temp: 99.6 F (37.6 C)  98.9 F (37.2 C)   TempSrc: Oral  Oral   SpO2: 100%  98%   Weight:   74.8 kg   Height:        Intake/Output Summary (Last 24 hours) at 11/10/2022 1114 Last data filed at 11/10/2022 0724 Gross per 24 hour  Intake 1057.67 ml  Output 750 ml  Net 307.67 ml    Filed Weights   11/08/22 1616 11/09/22 0500 11/10/22 0408  Weight: 68.3 kg 74.6 kg 74.8 kg    Physical Examination: Body mass index is 26.62 kg/m.  General: Alert awake mildly Communicative, oriented to place, speaking few words.   HENT:   No scleral pallor or icterus noted. Oral mucosa is moist.  Chest:  Clear breath sounds.. No crackles or wheezes.  CVS: S1 &S2 heard. No murmur.  Regular rate and  rhythm. Abdomen: Soft, nontender, nondistended.  Bowel sounds are heard.   Extremities: No cyanosis, clubbing with bilateral lower extremity edema.  Peripheral pulses are palpable. Psych: Alert, awake and mildly Communicative, oriented to place, CNS: Alert awake and mildly Communicative, moves extremities. Skin: Warm and dry.  No rashes noted.  Data Reviewed:   CBC: Recent Labs  Lab 11/08/22 1030 11/09/22 0534 11/10/22 0443  WBC 12.2* 14.5* 18.7*  NEUTROABS 11.2* 12.5* 16.3*  HGB 8.7* 7.6* 7.1*  HCT 27.7* 24.7* 22.2*  MCV 97.5 98.4 96.5  PLT 224 180 170     Basic Metabolic Panel: Recent Labs  Lab 11/08/22 1030 11/09/22 0534  NA 141 141  K 3.5 3.6  CL 107 109  CO2 26 24  GLUCOSE 63* 132*  BUN 40* 46*  CREATININE 3.65* 3.94*  CALCIUM 8.1* 7.9*  MG 1.9  --   PHOS 3.6  --      Liver  Function Tests: Recent Labs  Lab 11/08/22 1030 11/09/22 0534  AST 29 28  ALT 29 30  ALKPHOS 62 59  BILITOT 0.6 0.3  PROT 5.5* 5.2*  ALBUMIN 2.4* 2.1*      Radiology Studies: CT CHEST WO CONTRAST  Result Date: 11/09/2022 CLINICAL DATA:  Respiratory illness.  Fever and productive cough. EXAM: CT CHEST WITHOUT CONTRAST TECHNIQUE: Multidetector CT imaging of the chest was performed following the standard protocol without IV contrast. RADIATION DOSE REDUCTION: This exam was performed according to the departmental dose-optimization program which includes automated exposure control, adjustment of the mA and/or kV according to patient size and/or use of iterative reconstruction technique. COMPARISON:  X-ray 11/08/2022.  CT 05/08/2022.  Older exams as well FINDINGS: Cardiovascular: The heart is enlarged. Small pericardial effusion. Normal caliber thoracic aorta with some vascular calcifications including along the coronary arteries. Please correlate with other coronary risk factors. There is some enlargement of the main pulmonary arteries. Please correlate for pulmonary artery hypertension.  Mediastinum/Nodes: Slightly patulous thoracic esophagus. There may be some debris along the upper thoracic esophagus as well. On this non IV contrast exam there is no specific abnormal lymph node enlargement seen in the axillary region, hilum or mediastinum. A few small less than 1 cm in size nodes are seen, not pathologic by size criteria. Lungs/Pleura: Small right and tiny left pleural effusion identified. Extensive breathing motion throughout the examination. Diffuse bilateral perihilar lung opacities are identified, slightly right greater than left. Infiltrate versus edema is possible. There is some interstitial septal thickening. No pneumothorax. The ill-defined lung opacities are new from prior. Upper Abdomen: The adrenal glands are incompletely included in the imaging field. There is some air noted overlying the central left hepatic lobe. This could represent air in the biliary tree but indeterminate on this examination. Please correlate with history. Musculoskeletal: Mild degenerative changes along the spine. Anasarca. Focal lipoma anterior to the sternum on image 59 of series 2. IMPRESSION: Small right and tiny left pleural effusion.  Mild adjacent opacity. Diffuse perihilar hazy ground-glass opacities with interstitial septal thickening. The heart is also enlarged. Please correlate for evidence of edema. Recommend follow-up. Anasarca. Slightly patulous esophagus with some debris in the upper thoracic esophagus. Please correlate with symptoms. Area of air noted within the liver on the left side. This could be within the biliary tree but indeterminate. Please correlate for any history and dedicated evaluation when appropriate such as abdominal imaging. Preferably with contrast. Patient does have provided history of possible sepsis. Coronary artery calcifications. Please correlate for other coronary risk factors. Aortic Atherosclerosis (ICD10-I70.0). Electronically Signed   By: Karen Kays M.D.   On:  11/09/2022 12:09   ECHOCARDIOGRAM COMPLETE  Result Date: 11/09/2022    ECHOCARDIOGRAM REPORT   Patient Name:   TAKAO LIZER Date of Exam: 11/09/2022 Medical Rec #:  [id number]        Height:       66.0 in Accession #:    [id number]       Weight:       164.4 lb Date of Birth:  1956-07-31        BSA:          1.840 m Patient Age:    66 years         BP:           147/79 mmHg Patient Gender: M                HR:  73 bpm. Exam Location:  Inpatient Procedure: 2D Echo Indications:    CHF  History:        Patient has prior history of Echocardiogram examinations, most                 recent 05/10/2022. Stroke; Risk Factors:Dyslipidemia and                 Diabetes.  Sonographer:    Harvie Junior Referring Phys: [id number] [place]  1. Left ventricular ejection fraction, by estimation, is 60 to 65%. The left ventricle has normal function. The left ventricle has no regional wall motion abnormalities. There is severe concentric left ventricular hypertrophy. Left ventricular diastolic  parameters are consistent with Grade II diastolic dysfunction (pseudonormalization).  2. Right ventricular systolic function is normal. The right ventricular size is normal. The estimated right ventricular systolic pressure is 06.2 mmHg.  3. Left atrial size was mild to moderately dilated.  4. The mitral valve is grossly normal. Mild mitral valve regurgitation.  5. The aortic valve is tricuspid. There is moderate calcification of the aortic valve. There is severe thickening of the aortic valve. Aortic valve regurgitation is mild to moderate. Aortic valve sclerosis/calcification is present, without any evidence of aortic stenosis. Aortic regurgitation PHT measures 308 msec. Aortic valve area, by VTI measures 2.07 cm. Aortic valve mean gradient measures 7.3 mmHg. Aortic valve Vmax measures 1.88 m/s.  6. The inferior vena cava is dilated in size with <50% respiratory variability, suggesting right atrial pressure of  15 mmHg. Comparison(s): Compared to prior TTE on 04/2022, the filling pressures are elevated on current study. FINDINGS  Left Ventricle: Left ventricular ejection fraction, by estimation, is 60 to 65%. The left ventricle has normal function. The left ventricle has no regional wall motion abnormalities. The left ventricular internal cavity size was normal in size. There is  severe concentric left ventricular hypertrophy. Left ventricular diastolic parameters are consistent with Grade II diastolic dysfunction (pseudonormalization). Right Ventricle: The right ventricular size is normal. No increase in right ventricular wall thickness. Right ventricular systolic function is normal. The tricuspid regurgitant velocity is 2.96 m/s, and with an assumed right atrial pressure of 15 mmHg, the estimated right ventricular systolic pressure is 69.4 mmHg. Left Atrium: Left atrial size was mild to moderately dilated. Right Atrium: Right atrial size was normal in size. Pericardium: There is no evidence of pericardial effusion. Mitral Valve: The mitral valve is grossly normal. There is mild thickening of the mitral valve leaflet(s). There is mild calcification of the mitral valve leaflet(s). Mild to moderate mitral annular calcification. Mild mitral valve regurgitation. Tricuspid Valve: The tricuspid valve is normal in structure. Tricuspid valve regurgitation is mild. Aortic Valve: The aortic valve is tricuspid. There is moderate calcification of the aortic valve. There is severe thickening of the aortic valve. Aortic valve regurgitation is mild to moderate. Aortic regurgitation PHT measures 308 msec. Aortic valve sclerosis/calcification is present, without any evidence of aortic stenosis. Aortic valve mean gradient measures 7.3 mmHg. Aortic valve peak gradient measures 14.1 mmHg. Aortic valve area, by VTI measures 2.07 cm. Pulmonic Valve: The pulmonic valve was normal in structure. Pulmonic valve regurgitation is trivial. Aorta:  The aortic root is normal in size and structure. Venous: The inferior vena cava is dilated in size with less than 50% respiratory variability, suggesting right atrial pressure of 15 mmHg. IAS/Shunts: The atrial septum is grossly normal.  LEFT VENTRICLE PLAX 2D LVIDd:  5.00 cm      Diastology LVIDs:         3.70 cm      LV e' medial:    5.98 cm/s LV PW:         1.50 cm      LV E/e' medial:  17.2 LV IVS:        1.40 cm      LV e' lateral:   7.40 cm/s LVOT diam:     1.90 cm      LV E/e' lateral: 13.9 LV SV:         74 LV SV Index:   40 LVOT Area:     2.84 cm  LV Volumes (MOD) LV vol d, MOD A2C: 137.0 ml LV vol d, MOD A4C: 171.0 ml LV vol s, MOD A2C: 66.4 ml LV vol s, MOD A4C: 82.4 ml LV SV MOD A2C:     70.6 ml LV SV MOD A4C:     171.0 ml LV SV MOD BP:      76.1 ml RIGHT VENTRICLE RV Basal diam:  4.00 cm RV Mid diam:    3.10 cm RV S prime:     11.90 cm/s TAPSE (M-mode): 1.7 cm LEFT ATRIUM           Index        RIGHT ATRIUM           Index LA diam:      3.60 cm 1.96 cm/m   RA Area:     13.30 cm LA Vol (A2C): 61.7 ml 33.53 ml/m  RA Volume:   27.50 ml  14.95 ml/m LA Vol (A4C): 63.5 ml 34.51 ml/m  AORTIC VALVE                     PULMONIC VALVE AV Area (Vmax):    2.01 cm      PV Vmax:          1.30 m/s AV Area (Vmean):   1.94 cm      PV Peak grad:     6.8 mmHg AV Area (VTI):     2.07 cm      PR End Diast Vel: 4.24 msec AV Vmax:           188.00 cm/s AV Vmean:          126.667 cm/s AV VTI:            0.357 m AV Peak Grad:      14.1 mmHg AV Mean Grad:      7.3 mmHg LVOT Vmax:         133.00 cm/s LVOT Vmean:        86.500 cm/s LVOT VTI:          0.261 m LVOT/AV VTI ratio: 0.73 AI PHT:            308 msec  AORTA Ao Root diam: 3.50 cm Ao Asc diam:  2.80 cm MITRAL VALVE                TRICUSPID VALVE MV Area (PHT): 4.39 cm     TR Peak grad:   35.0 mmHg MV Decel Time: 173 msec     TR Vmax:        296.00 cm/s MR Peak grad: 61.6 mmHg MR Vmax:      392.33 cm/s   SHUNTS MV E velocity: 103.00 cm/s  Systemic VTI:  0.26  m MV A velocity: 76.30 cm/s  Systemic Diam: 1.90 cm MV E/A ratio:  1.35 Laurance Flatten MD Electronically signed by Laurance Flatten MD Signature Date/Time: 11/09/2022/9:38:14 AM    Final       LOS: 2 days   Joycelyn Das, MD [facility] Available via Epic secure chat 7am-7pm After these hours, please refer to coverage provider listed on [email or web address] 11/10/2022, 11:14 AM

## 2022-11-10 NOTE — Progress Notes (Signed)
Heart Failure Navigator Progress Note  Assessed for Heart & Vascular TOC clinic readiness.  Patient does not meet criteria due to EF 60-65 %, ESRD Creat 3.94, .   Navigator will sign off at this time.    Earnestine Leys, BSN, Clinical cytogeneticist Only

## 2022-11-10 NOTE — Plan of Care (Signed)
Problem: Fluid Volume: Goal: Hemodynamic stability will improve 11/10/2022 1455 by Carlynn Purl, RN Outcome: Adequate for Discharge 11/10/2022 1317 by Carlynn Purl, RN Outcome: Progressing   Problem: Clinical Measurements: Goal: Diagnostic test results will improve 11/10/2022 1455 by Carlynn Purl, RN Outcome: Adequate for Discharge 11/10/2022 1317 by Carlynn Purl, RN Outcome: Progressing Goal: Signs and symptoms of infection will decrease 11/10/2022 1455 by Carlynn Purl, RN Outcome: Adequate for Discharge 11/10/2022 1317 by Carlynn Purl, RN Outcome: Progressing   Problem: Respiratory: Goal: Ability to maintain adequate ventilation will improve 11/10/2022 1455 by Carlynn Purl, RN Outcome: Adequate for Discharge 11/10/2022 1317 by Carlynn Purl, RN Outcome: Progressing   Problem: Activity: Goal: Ability to tolerate increased activity will improve 11/10/2022 1455 by Carlynn Purl, RN Outcome: Adequate for Discharge 11/10/2022 1317 by Carlynn Purl, RN Outcome: Progressing   Problem: Clinical Measurements: Goal: Ability to maintain a body temperature in the normal range will improve 11/10/2022 1455 by Carlynn Purl, RN Outcome: Adequate for Discharge 11/10/2022 1317 by Carlynn Purl, RN Outcome: Progressing   Problem: Respiratory: Goal: Ability to maintain adequate ventilation will improve 11/10/2022 1455 by Carlynn Purl, RN Outcome: Adequate for Discharge 11/10/2022 1317 by Carlynn Purl, RN Outcome: Progressing Goal: Ability to maintain a clear airway will improve 11/10/2022 1455 by Carlynn Purl, RN Outcome: Adequate for Discharge 11/10/2022 1317 by Carlynn Purl, RN Outcome: Progressing   Problem: Education: Goal: Ability to describe self-care measures that may prevent or decrease complications (Diabetes Survival Skills Education) will improve 11/10/2022 1455 by Carlynn Purl, RN Outcome: Adequate for Discharge 11/10/2022 1317 by Carlynn Purl,  RN Outcome: Progressing Goal: Individualized Educational Video(s) 11/10/2022 1455 by Carlynn Purl, RN Outcome: Adequate for Discharge 11/10/2022 1317 by Carlynn Purl, RN Outcome: Progressing   Problem: Coping: Goal: Ability to adjust to condition or change in health will improve 11/10/2022 1455 by Carlynn Purl, RN Outcome: Adequate for Discharge 11/10/2022 1317 by Carlynn Purl, RN Outcome: Progressing   Problem: Fluid Volume: Goal: Ability to maintain a balanced intake and output will improve 11/10/2022 1455 by Carlynn Purl, RN Outcome: Adequate for Discharge 11/10/2022 1317 by Carlynn Purl, RN Outcome: Progressing   Problem: Health Behavior/Discharge Planning: Goal: Ability to identify and utilize available resources and services will improve 11/10/2022 1455 by Carlynn Purl, RN Outcome: Adequate for Discharge 11/10/2022 1317 by Carlynn Purl, RN Outcome: Progressing Goal: Ability to manage health-related needs will improve 11/10/2022 1455 by Carlynn Purl, RN Outcome: Adequate for Discharge 11/10/2022 1317 by Carlynn Purl, RN Outcome: Progressing   Problem: Metabolic: Goal: Ability to maintain appropriate glucose levels will improve 11/10/2022 1455 by Carlynn Purl, RN Outcome: Adequate for Discharge 11/10/2022 1317 by Carlynn Purl, RN Outcome: Progressing   Problem: Nutritional: Goal: Maintenance of adequate nutrition will improve 11/10/2022 1455 by Carlynn Purl, RN Outcome: Adequate for Discharge 11/10/2022 1317 by Carlynn Purl, RN Outcome: Progressing Goal: Progress toward achieving an optimal weight will improve 11/10/2022 1455 by Carlynn Purl, RN Outcome: Adequate for Discharge 11/10/2022 1317 by Carlynn Purl, RN Outcome: Progressing   Problem: Skin Integrity: Goal: Risk for impaired skin integrity will decrease 11/10/2022 1455 by Carlynn Purl, RN Outcome: Adequate for Discharge 11/10/2022 1317 by Carlynn Purl, RN Outcome: Progressing    Problem: Tissue Perfusion: Goal: Adequacy of tissue perfusion will improve 11/10/2022 1455 by Carlynn Purl,  RN Outcome: Adequate for Discharge 11/10/2022 1317 by Carlynn Purl, RN Outcome: Progressing   Problem: Education: Goal: Knowledge of General Education information will improve Description: Including pain rating scale, medication(s)/side effects and non-pharmacologic comfort measures 11/10/2022 1455 by Carlynn Purl, RN Outcome: Adequate for Discharge 11/10/2022 1317 by Carlynn Purl, RN Outcome: Progressing   Problem: Health Behavior/Discharge Planning: Goal: Ability to manage health-related needs will improve 11/10/2022 1455 by Carlynn Purl, RN Outcome: Adequate for Discharge 11/10/2022 1317 by Carlynn Purl, RN Outcome: Progressing   Problem: Clinical Measurements: Goal: Ability to maintain clinical measurements within normal limits will improve 11/10/2022 1455 by Carlynn Purl, RN Outcome: Adequate for Discharge 11/10/2022 1317 by Carlynn Purl, RN Outcome: Progressing Goal: Will remain free from infection 11/10/2022 1455 by Carlynn Purl, RN Outcome: Adequate for Discharge 11/10/2022 1317 by Carlynn Purl, RN Outcome: Progressing Goal: Diagnostic test results will improve 11/10/2022 1455 by Carlynn Purl, RN Outcome: Adequate for Discharge 11/10/2022 1317 by Carlynn Purl, RN Outcome: Progressing Goal: Respiratory complications will improve 11/10/2022 1455 by Carlynn Purl, RN Outcome: Adequate for Discharge 11/10/2022 1317 by Carlynn Purl, RN Outcome: Progressing Goal: Cardiovascular complication will be avoided 11/10/2022 1455 by Carlynn Purl, RN Outcome: Adequate for Discharge 11/10/2022 1317 by Carlynn Purl, RN Outcome: Progressing   Problem: Activity: Goal: Risk for activity intolerance will decrease 11/10/2022 1455 by Carlynn Purl, RN Outcome: Adequate for Discharge 11/10/2022 1317 by Carlynn Purl, RN Outcome: Progressing   Problem:  Nutrition: Goal: Adequate nutrition will be maintained 11/10/2022 1455 by Carlynn Purl, RN Outcome: Adequate for Discharge 11/10/2022 1317 by Carlynn Purl, RN Outcome: Progressing   Problem: Coping: Goal: Level of anxiety will decrease 11/10/2022 1455 by Carlynn Purl, RN Outcome: Adequate for Discharge 11/10/2022 1317 by Carlynn Purl, RN Outcome: Progressing   Problem: Elimination: Goal: Will not experience complications related to bowel motility 11/10/2022 1455 by Carlynn Purl, RN Outcome: Adequate for Discharge 11/10/2022 1317 by Carlynn Purl, RN Outcome: Progressing Goal: Will not experience complications related to urinary retention 11/10/2022 1455 by Carlynn Purl, RN Outcome: Adequate for Discharge 11/10/2022 1317 by Carlynn Purl, RN Outcome: Progressing   Problem: Pain Managment: Goal: General experience of comfort will improve 11/10/2022 1455 by Carlynn Purl, RN Outcome: Adequate for Discharge 11/10/2022 1317 by Carlynn Purl, RN Outcome: Progressing   Problem: Safety: Goal: Ability to remain free from injury will improve 11/10/2022 1455 by Carlynn Purl, RN Outcome: Adequate for Discharge 11/10/2022 1317 by Carlynn Purl, RN Outcome: Progressing   Problem: Skin Integrity: Goal: Risk for impaired skin integrity will decrease 11/10/2022 1455 by Carlynn Purl, RN Outcome: Adequate for Discharge 11/10/2022 1317 by Carlynn Purl, RN Outcome: Progressing

## 2022-11-11 DIAGNOSIS — N179 Acute kidney failure, unspecified: Secondary | ICD-10-CM | POA: Diagnosis not present

## 2022-11-11 DIAGNOSIS — I5033 Acute on chronic diastolic (congestive) heart failure: Secondary | ICD-10-CM

## 2022-11-11 DIAGNOSIS — F418 Other specified anxiety disorders: Secondary | ICD-10-CM | POA: Diagnosis not present

## 2022-11-11 DIAGNOSIS — A419 Sepsis, unspecified organism: Secondary | ICD-10-CM | POA: Diagnosis not present

## 2022-11-11 DIAGNOSIS — G40909 Epilepsy, unspecified, not intractable, without status epilepticus: Secondary | ICD-10-CM | POA: Diagnosis not present

## 2022-11-11 LAB — CBC WITH DIFFERENTIAL/PLATELET
Abs Immature Granulocytes: 0.17 10*3/uL — ABNORMAL HIGH (ref 0.00–0.07)
Basophils Absolute: 0 10*3/uL (ref 0.0–0.1)
Basophils Relative: 0 %
Eosinophils Absolute: 0.3 10*3/uL (ref 0.0–0.5)
Eosinophils Relative: 2 %
HCT: 21.6 % — ABNORMAL LOW (ref 39.0–52.0)
Hemoglobin: 7 g/dL — ABNORMAL LOW (ref 13.0–17.0)
Immature Granulocytes: 1 %
Lymphocytes Relative: 4 %
Lymphs Abs: 0.8 10*3/uL (ref 0.7–4.0)
MCH: 30.2 pg (ref 26.0–34.0)
MCHC: 32.4 g/dL (ref 30.0–36.0)
MCV: 93.1 fL (ref 80.0–100.0)
Monocytes Absolute: 1.2 10*3/uL — ABNORMAL HIGH (ref 0.1–1.0)
Monocytes Relative: 7 %
Neutro Abs: 14.8 10*3/uL — ABNORMAL HIGH (ref 1.7–7.7)
Neutrophils Relative %: 86 %
Platelets: 120 10*3/uL — ABNORMAL LOW (ref 150–400)
RBC: 2.32 MIL/uL — ABNORMAL LOW (ref 4.22–5.81)
RDW: 15.3 % (ref 11.5–15.5)
WBC: 17.3 10*3/uL — ABNORMAL HIGH (ref 4.0–10.5)
nRBC: 0.1 % (ref 0.0–0.2)

## 2022-11-11 LAB — GLUCOSE, CAPILLARY
Glucose-Capillary: 179 mg/dL — ABNORMAL HIGH (ref 70–99)
Glucose-Capillary: 111 mg/dL — ABNORMAL HIGH (ref 70–99)
Glucose-Capillary: 182 mg/dL — ABNORMAL HIGH (ref 70–99)
Glucose-Capillary: 253 mg/dL — ABNORMAL HIGH (ref 70–99)
Glucose-Capillary: 250 mg/dL — ABNORMAL HIGH (ref 70–99)
Glucose-Capillary: 215 mg/dL — ABNORMAL HIGH (ref 70–99)

## 2022-11-11 NOTE — Progress Notes (Signed)
PROGRESS NOTE    Manuel Kramer  NWG:[id number] DOB: 1955/12/27 DOA: 11/08/2022 PCP: Diamantina Providence, FNP    Brief Narrative:  Manuel Kramer is a 67 y.o. male with past medical history significant of chronic diarrhea, history of C. 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  In the ED, patient was febrile with a temperature of 101 F and was mildly hypertensive.  Urinalysis showed glycosuria proteinuria more than 50 white cells with budding yeast.  He did have mild leukocytosis at 12.2.  Coronavirus influenza and RSV PCR was negative but BNP was elevated at 2183.  Lactate was elevated at 2.1 followed by 1.4.  Creatinine elevated at 3.6.  Portable chest x-ray showed enlarged pericardial silhouette with diffuse vascular congestion.  Patient was then given IV fluid bolus ceftriaxone and was considered for admission to the hospital for further workup and treatment.   Assessment and plan.  Sepsis likely due to pneumonia, staph epidermidis bacteremia.. Patient with abnormal urinalysis as well.  Patient is on Rocephin, Zithromax and Zyvox.  Strep urinary antigen negative.  Urine culture no growth so far.  Blood cultures showing methicillin-resistant staph epidermidis.  WBC at 17.3 from 18.7< 14.5.  CT scan of the chest shows a small bilateral pleural effusion with diffuse perihilar hazy groundglass opacity with interstitial thickening anasarca.  Patient was febrile with temperature max of 98.2 F.Marland Kitchen  Spoke with infectious disease about bacteremia and leukocytosis and fever.  Repeat blood cultures have been sent yesterday and  negative in 24 hours..  Will continue to follow blood cultures.     Acute renal failure superimposed on stage 3b chronic kidney disease  Currently holding off with torsemide, spironolactone and losartan from home. monitor intake and output charting, Daily weights.  Creatinine was zero 4.0 yesterday.  Will check creatinine in AM.    Acute on chronic diastolic congestive heart failure  Continue intake and output charting, daily weights.  Hold diuretic and losartan for now.   2D echocardiogram with LV ejection fraction of 60 to 65% with grade 2 diastolic dysfunction..  Continue Coreg.  Elevated creatinine.    Essential hypertension  Continue Coreg, increase dose of clonidine.  Continue amlodipine hydralazine, Isordil.  Holding diuretics and ARB.     Type 2 diabetes mellitus with complication,  with long-term current use of insulin Patient presented with hypoglycemia.  Carbohydrate controlled diet.  Continue long-acting insulin from home add sliding scale insulin.  Hold off with mealtime insulin for now.  Check BMP in AM.    Depression with anxiety Continue sertraline.     Hyperlipidemia Continue Lipitor     Anemia of chronic disease Monitor hematocrit and hemoglobin.  Latest hemoglobin of 7.0.  No mention of bleeding.  Will transfuse for hemoglobin less than 7.     Diabetic neuropathy with hyperglycemia. Continue gabapentin.       Seizure disorder (HCC) Continue Keppra.  Seizure precautions.  Neurochecks.     History of CVA (cerebrovascular accident)  Continue Plavix Lipitor.    DVT prophylaxis: SCDs Start: 11/08/22 1534   Code Status:     Code Status: Full Code  Disposition: Skilled nursing facility, patient is from skilled nursing facility likely in 1-2  days  Status is: Inpatient  Remains inpatient appropriate because: Bacteremia, IV antibiotic, sepsis, renal failure anemia, repeat blood cultures pending   Family Communication:  Spoke with the patient's daughter on the  phone on 11/10/2022.  Consultants:  Verbal consultation with ID.  Procedures:  None  Antimicrobials:  Rocephin, Zithromax, zyvox  Anti-infectives (From admission, onward)    Start     Dose/Rate Route Frequency Ordered Stop   11/09/22 1400  cefTRIAXone (ROCEPHIN) 1 g in sodium chloride 0.9 % 100 mL IVPB        1 g 200 mL/hr over 30 Minutes Intravenous Every 24 hours 11/08/22 1530 11/13/22 1359   11/09/22 1145  linezolid (ZYVOX) IVPB 600 mg        600 mg 300 mL/hr over 60 Minutes Intravenous Every 12 hours 11/09/22 1056     11/08/22 1545  azithromycin (ZITHROMAX) 500 mg in sodium chloride 0.9 % 250 mL IVPB        500 mg 250 mL/hr over 60 Minutes Intravenous Every 24 hours 11/08/22 1530 11/13/22 1544   11/08/22 1045  cefTRIAXone (ROCEPHIN) 2 g in sodium chloride 0.9 % 100 mL IVPB        2 g 200 mL/hr over 30 Minutes Intravenous  Once 11/08/22 1036 11/08/22 1227      Subjective: Today, patient was seen and examined at bedside.  Appears more alert awake and Communicative.  Answering few questions oriented to place only.   Objective: Vitals:   11/11/22 0454 11/11/22 0522 11/11/22 0523 11/11/22 0524  BP: 118/70  116/72   Pulse: 60     Resp: 17 11  20   Temp: 98.2 F (36.8 C)     TempSrc: Oral     SpO2: 97%     Weight: 74.7 kg     Height:        Intake/Output Summary (Last 24 hours) at 11/11/2022 1431 Last data filed at 11/11/2022 0800 Gross per 24 hour  Intake 939.25 ml  Output 600 ml  Net 339.25 ml    Filed Weights   11/09/22 0500 11/10/22 0408 11/11/22 0454  Weight: 74.6 kg 74.8 kg 74.7 kg    Physical Examination: Body mass index is 26.58 kg/m.   General: Alert awake, mildly verbal, oriented to place, answering few questions, HENT:   No scleral pallor or icterus noted. Oral mucosa is moist.  Chest: Decreased breath sounds bilateral.  Breath sounds clear. CVS: S1 &S2 heard. No murmur.  Regular rate and rhythm. Abdomen: Soft, nontender, nondistended.  Bowel  sounds are heard.   Extremities: No cyanosis, bilateral lower extremity edema. Marland Kitchen Psych: Alert, awake and mildly Communicative, oriented to place, CNS: Alert awake and mildly Communicative, moves extremities.  Generalized weakness noted Skin: Warm and dry.  No rashes noted.  Data Reviewed:   CBC: Recent Labs  Lab 11/08/22 1030 11/09/22 0534 11/10/22 0443 11/11/22 0949  WBC 12.2* 14.5* 18.7* 17.3*  NEUTROABS 11.2* 12.5* 16.3* 14.8*  HGB 8.7* 7.6* 7.1* 7.0*  HCT 27.7* 24.7* 22.2* 21.6*  MCV 97.5 98.4 96.5 93.1  PLT 224 180 170 120*     Basic Metabolic Panel: Recent Labs  Lab 11/08/22 1030 11/09/22 0534 11/10/22 1136  NA 141 141 138  K 3.5 3.6 3.8  CL 107 109 105  CO2 26 24 22   GLUCOSE 63* 132* 286*  BUN 40* 46* 59*  CREATININE 3.65* 3.94* 4.03*  CALCIUM 8.1* 7.9* 7.7*  MG 1.9  --   --   PHOS 3.6  --   --  Liver Function Tests: Recent Labs  Lab 11/08/22 1030 11/09/22 0534  AST 29 28  ALT 29 30  ALKPHOS 62 59  BILITOT 0.6 0.3  PROT 5.5* 5.2*  ALBUMIN 2.4* 2.1*      Radiology Studies: No results found.    LOS: 3 days   Flora Lipps, MD [facility] Available via Epic secure chat 7am-7pm After these hours, please refer to coverage provider listed on [email or web address] 11/11/2022, 2:31 PM

## 2022-11-11 NOTE — Plan of Care (Signed)
  Problem: Fluid Volume: Goal: Hemodynamic stability will improve Outcome: Progressing   Problem: Clinical Measurements: Goal: Diagnostic test results will improve Outcome: Progressing Goal: Signs and symptoms of infection will decrease Outcome: Progressing   Problem: Respiratory: Goal: Ability to maintain adequate ventilation will improve Outcome: Progressing   Problem: Activity: Goal: Ability to tolerate increased activity will improve Outcome: Progressing   Problem: Clinical Measurements: Goal: Ability to maintain a body temperature in the normal range will improve Outcome: Progressing   Problem: Respiratory: Goal: Ability to maintain adequate ventilation will improve Outcome: Progressing Goal: Ability to maintain a clear airway will improve Outcome: Progressing   Problem: Education: Goal: Ability to describe self-care measures that may prevent or decrease complications (Diabetes Survival Skills Education) will improve Outcome: Progressing Goal: Individualized Educational Video(s) Outcome: Progressing   Problem: Coping: Goal: Ability to adjust to condition or change in health will improve Outcome: Progressing   Problem: Fluid Volume: Goal: Ability to maintain a balanced intake and output will improve Outcome: Progressing   Problem: Health Behavior/Discharge Planning: Goal: Ability to identify and utilize available resources and services will improve Outcome: Progressing Goal: Ability to manage health-related needs will improve Outcome: Progressing   Problem: Metabolic: Goal: Ability to maintain appropriate glucose levels will improve Outcome: Progressing   Problem: Nutritional: Goal: Maintenance of adequate nutrition will improve Outcome: Progressing Goal: Progress toward achieving an optimal weight will improve Outcome: Progressing   Problem: Skin Integrity: Goal: Risk for impaired skin integrity will decrease Outcome: Progressing   Problem: Tissue  Perfusion: Goal: Adequacy of tissue perfusion will improve Outcome: Progressing   Problem: Education: Goal: Knowledge of General Education information will improve Description: Including pain rating scale, medication(s)/side effects and non-pharmacologic comfort measures Outcome: Progressing   Problem: Health Behavior/Discharge Planning: Goal: Ability to manage health-related needs will improve Outcome: Progressing   Problem: Clinical Measurements: Goal: Ability to maintain clinical measurements within normal limits will improve Outcome: Progressing Goal: Will remain free from infection Outcome: Progressing Goal: Diagnostic test results will improve Outcome: Progressing Goal: Respiratory complications will improve Outcome: Progressing Goal: Cardiovascular complication will be avoided Outcome: Progressing   Problem: Activity: Goal: Risk for activity intolerance will decrease Outcome: Progressing   Problem: Nutrition: Goal: Adequate nutrition will be maintained Outcome: Progressing   Problem: Coping: Goal: Level of anxiety will decrease Outcome: Progressing   Problem: Elimination: Goal: Will not experience complications related to bowel motility Outcome: Progressing Goal: Will not experience complications related to urinary retention Outcome: Progressing   Problem: Pain Managment: Goal: General experience of comfort will improve Outcome: Progressing   Problem: Safety: Goal: Ability to remain free from injury will improve Outcome: Progressing   Problem: Skin Integrity: Goal: Risk for impaired skin integrity will decrease Outcome: Progressing

## 2022-11-12 DIAGNOSIS — E118 Type 2 diabetes mellitus with unspecified complications: Secondary | ICD-10-CM | POA: Diagnosis not present

## 2022-11-12 DIAGNOSIS — A419 Sepsis, unspecified organism: Secondary | ICD-10-CM | POA: Diagnosis not present

## 2022-11-12 DIAGNOSIS — N179 Acute kidney failure, unspecified: Secondary | ICD-10-CM | POA: Diagnosis not present

## 2022-11-12 DIAGNOSIS — G40909 Epilepsy, unspecified, not intractable, without status epilepticus: Secondary | ICD-10-CM | POA: Diagnosis not present

## 2022-11-12 LAB — BASIC METABOLIC PANEL
Anion gap: 11 (ref 5–15)
BUN: 59 mg/dL — ABNORMAL HIGH (ref 8–23)
CO2: 24 mmol/L (ref 22–32)
Calcium: 7.9 mg/dL — ABNORMAL LOW (ref 8.9–10.3)
Chloride: 101 mmol/L (ref 98–111)
Creatinine, Ser: 3.77 mg/dL — ABNORMAL HIGH (ref 0.61–1.24)
GFR, Estimated: 17 mL/min — ABNORMAL LOW (ref >60)
Glucose, Bld: 190 mg/dL — ABNORMAL HIGH (ref 70–99)
Potassium: 3.5 mmol/L (ref 3.5–5.1)
Sodium: 136 mmol/L (ref 135–145)

## 2022-11-12 LAB — CBC
HCT: 22.7 % — ABNORMAL LOW (ref 39.0–52.0)
Hemoglobin: 7.1 g/dL — ABNORMAL LOW (ref 13.0–17.0)
MCH: 30.2 pg (ref 26.0–34.0)
MCHC: 31.3 g/dL (ref 30.0–36.0)
MCV: 96.6 fL (ref 80.0–100.0)
Platelets: 177 10*3/uL (ref 150–400)
RBC: 2.35 MIL/uL — ABNORMAL LOW (ref 4.22–5.81)
RDW: 15.4 % (ref 11.5–15.5)
WBC: 11 10*3/uL — ABNORMAL HIGH (ref 4.0–10.5)
nRBC: 0.5 % — ABNORMAL HIGH (ref 0.0–0.2)

## 2022-11-12 LAB — CULTURE, BLOOD (ROUTINE X 2): Special Requests: ADEQUATE

## 2022-11-12 LAB — GLUCOSE, CAPILLARY
Glucose-Capillary: 182 mg/dL — ABNORMAL HIGH (ref 70–99)
Glucose-Capillary: 260 mg/dL — ABNORMAL HIGH (ref 70–99)
Glucose-Capillary: 303 mg/dL — ABNORMAL HIGH (ref 70–99)
Glucose-Capillary: 125 mg/dL — ABNORMAL HIGH (ref 70–99)

## 2022-11-12 LAB — MAGNESIUM: Magnesium: 1.9 mg/dL (ref 1.7–2.4)

## 2022-11-12 MED ORDER — INSULIN ASPART 100 UNIT/ML IJ SOLN
3.0000 [IU] | Freq: Three times a day (TID) | INTRAMUSCULAR | Status: DC
  Administered 2022-11-12: 3 [IU] via SUBCUTANEOUS

## 2022-11-12 NOTE — Progress Notes (Signed)
PROGRESS NOTE    Manuel Kramer  LNL:[id number] DOB: 04/05/1956 DOA: 11/08/2022 PCP: Vonna Drafts, FNP    Brief Narrative:  Manuel Kramer is a 67 y.o. male with past medical history significant of chronic diarrhea, history of C. 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  In the ED, patient was febrile with a temperature of 101 F and was mildly hypertensive.  Urinalysis showed glycosuria proteinuria more than 50 white cells with budding yeast.  He did have mild leukocytosis at 12.2.  Coronavirus influenza and RSV PCR was negative but BNP was elevated at 2183.  Lactate was elevated at 2.1 followed by 1.4.  Creatinine elevated at 3.6.  Portable chest x-ray showed enlarged pericardial silhouette with diffuse vascular congestion.  Patient was then given IV fluid bolus ceftriaxone and was considered for admission to the hospital for further workup and treatment.   Assessment and plan.  Sepsis likely due to pneumonia, staph epidermidis bacteremia. Patient with abnormal urinalysis as well.  Patient is on Rocephin, Zithromax and Zyvox.  Strep urinary antigen negative.  Urine culture no growth so far.  Blood cultures showing methicillin-resistant staph epidermidis and Staph hominis likely contaminant at this time.  Communicated with pharmacy.  Will discontinue Zyvox today.  WBC at 11.0<17.3<18.7< 14.5.  CT scan of the chest shows a small bilateral pleural effusion with diffuse perihilar hazy groundglass opacity with interstitial thickening anasarca.   Spoke with infectious disease about bacteremia and leukocytosis and fever.   Repeat blood cultures negative in 2 days.  At this time we will continue Rocephin and Zithromax to complete a 5-day course of antibiotic.  Discontinue Zyvox.     Acute renal failure superimposed on stage 3b chronic kidney disease  Currently holding off with torsemide, spironolactone and losartan from home. monitor intake and output charting, Daily weights.  Creatinine was creatinine today at 3.7 from 4.0.  Baseline at  2.0. Receiving IV fluids.  Continue for 1 more day. Postive balance for 2349 ml. Will dc IV fluids today.    Acute on chronic diastolic congestive heart failure  Continue intake and output charting, daily weights.  Hold diuretic and losartan for now.   2D echocardiogram with LV ejection fraction of 60 to 65% with grade 2 diastolic dysfunction..  Continue Coreg. DC  IV fluids.    Essential hypertension  Continue Coreg, clonidine, amlodipine hydralazine, Isordil.  Holding diuretics and ARB.  Latest blood pressure at 150/85.    Type 2 diabetes mellitus with complication,  with long-term current use of insulin Patient presented with hypoglycemia.  Carbohydrate controlled diet.  Continue long-acting insulin from home add sliding scale insulin.   Now with hyperglycemia.  Will resume mealtime insulin.    Depression with anxiety Continue sertraline.     Hyperlipidemia Continue Lipitor     Anemia of chronic disease Monitor hematocrit and hemoglobin.  Latest hemoglobin of 7.0.  No mention of bleeding.  Will transfuse for hemoglobin less than 7.     Diabetic neuropathy with hyperglycemia. Continue gabapentin.       Seizure disorder ([place]) Continue Keppra.  Seizure precautions.  Neurochecks.     History of CVA (cerebrovascular accident)  Continue Plavix Lipitor.  DVT prophylaxis: SCDs Start: 11/08/22 1534   Code Status:     Code Status: Full Code  Disposition: Skilled nursing facility,  likely on 11/13/2022.  Patient appears medically stable at this time for  disposition.  Status is: Inpatient  Remains inpatient appropriate because: Bacteremia, IV antibiotic, renal failure, anemia,   Family Communication:  Spoke with the patient's daughter on the phone on 11/10/2022.  Consultants:  Verbal consultation with ID.  Procedures:  None  Antimicrobials:  Rocephin, Zithromax, Zyvox will discontinue.  Anti-infectives (From admission, onward)    Start     Dose/Rate Route Frequency Ordered Stop   11/09/22 1400  cefTRIAXone (ROCEPHIN) 1 g in sodium chloride 0.9 % 100 mL IVPB        1 g 200 mL/hr over 30 Minutes Intravenous Every 24 hours 11/08/22 1530 11/13/22 1359   11/09/22 1145  linezolid (ZYVOX) IVPB 600 mg  Status:  Discontinued        600 mg 300 mL/hr over 60 Minutes Intravenous Every 12 hours 11/09/22 1056 11/12/22 1052   11/08/22 1545  azithromycin (ZITHROMAX) 500 mg in sodium chloride 0.9 % 250 mL IVPB        500 mg 250 mL/hr over 60 Minutes Intravenous Every 24 hours 11/08/22 1530 11/13/22 1544   11/08/22 1045  cefTRIAXone (ROCEPHIN) 2 g in sodium chloride 0.9 % 100 mL IVPB        2 g 200 mL/hr over 30 Minutes Intravenous  Once 11/08/22 1036 11/08/22 1227      Subjective: Today, patient was seen and examined at bedside.  Denies interval complaints.  Denies any pain, nausea, vomiting, fever, chills or rigor.  Patient is disoriented but otherwise appears okay.   Objective: Vitals:   11/11/22 2134 11/12/22 0500 11/12/22 0528 11/12/22 1242  BP: 123/71  (!) 145/84 (!) 150/85  Pulse: (!) 55  (!) 56 61  Resp:   16 16  Temp:      TempSrc:      SpO2:   100% 98%  Weight:  73.1 kg    Height:        Intake/Output Summary (Last 24 hours) at 11/12/2022 1446 Last data filed at 11/12/2022 0900 Gross per 24 hour  Intake 1260 ml  Output 450 ml  Net 810 ml    Filed Weights   11/10/22 0408 11/11/22 0454 11/12/22 0500  Weight: 74.8 kg 74.7 kg 73.1 kg    Physical Examination: Body mass index is 26.01 kg/m.   General: Alert awake  and Communicative, disoriented, answers questions appropriately.   HENT:   No scleral pallor or icterus noted. Oral mucosa is moist.  Chest: Clear breath sounds, diminished bilaterally.   CVS: S1 &S2 heard. No murmur.  Regular rate and rhythm. Abdomen: Soft, nontender, nondistended.  Bowel sounds are heard.   Extremities: No cyanosis,, trace bilateral lower extremity edema. Psych: Alert, awake and mildly Communicative, oriented to place, CNS: Alert awake Communicative, answering questions, generalized weakness noted Skin: Warm and dry.  No rashes noted.  Data Reviewed:   CBC: Recent Labs  Lab 11/08/22 1030 11/09/22 0534 11/10/22 0443 11/11/22 0949 11/12/22 0556  WBC 12.2* 14.5* 18.7* 17.3* 11.0*  NEUTROABS 11.2* 12.5* 16.3* 14.8*  --   HGB 8.7* 7.6* 7.1* 7.0* 7.1*  HCT 27.7* 24.7* 22.2* 21.6* 22.7*  MCV 97.5 98.4 96.5 93.1 96.6  PLT 224 180 170 120* 177     Basic Metabolic Panel: Recent Labs  Lab 11/08/22 1030 11/09/22 0534 11/10/22 1136 11/12/22 0556  NA 141 141 138 136  K 3.5 3.6 3.8 3.5  CL 107 109 105 101  CO2 26 24 22 24   GLUCOSE 63* 132* 286* 190*  BUN 40* 46* 59* 59*  CREATININE 3.65* 3.94* 4.03* 3.77*  CALCIUM 8.1* 7.9* 7.7* 7.9*  MG 1.9  --   --  1.9  PHOS 3.6  --   --   --      Liver Function Tests: Recent Labs  Lab 11/08/22 1030 11/09/22 0534  AST 29 28  ALT 29 30  ALKPHOS 62 59  BILITOT 0.6 0.3  PROT 5.5* 5.2*  ALBUMIN 2.4* 2.1*      Radiology Studies: No results found.    LOS: 4 days   01/08/23, MD [facility] Available via Epic secure chat 7am-7pm After these hours, please refer to coverage provider listed on [email or web address] 11/12/2022, 2:46 PM

## 2022-11-13 DIAGNOSIS — N179 Acute kidney failure, unspecified: Secondary | ICD-10-CM | POA: Diagnosis not present

## 2022-11-13 DIAGNOSIS — G40909 Epilepsy, unspecified, not intractable, without status epilepticus: Secondary | ICD-10-CM | POA: Diagnosis not present

## 2022-11-13 DIAGNOSIS — E118 Type 2 diabetes mellitus with unspecified complications: Secondary | ICD-10-CM | POA: Diagnosis not present

## 2022-11-13 DIAGNOSIS — A419 Sepsis, unspecified organism: Secondary | ICD-10-CM | POA: Diagnosis not present

## 2022-11-13 LAB — CBC
HCT: 23.5 % — ABNORMAL LOW (ref 39.0–52.0)
Hemoglobin: 7.5 g/dL — ABNORMAL LOW (ref 13.0–17.0)
MCH: 30.6 pg (ref 26.0–34.0)
MCHC: 31.9 g/dL (ref 30.0–36.0)
MCV: 95.9 fL (ref 80.0–100.0)
Platelets: 207 10*3/uL (ref 150–400)
RBC: 2.45 MIL/uL — ABNORMAL LOW (ref 4.22–5.81)
RDW: 15.1 % (ref 11.5–15.5)
WBC: 7.8 10*3/uL (ref 4.0–10.5)
nRBC: 0.5 % — ABNORMAL HIGH (ref 0.0–0.2)

## 2022-11-13 LAB — GLUCOSE, CAPILLARY
Glucose-Capillary: 49 mg/dL — ABNORMAL LOW (ref 70–99)
Glucose-Capillary: 89 mg/dL (ref 70–99)
Glucose-Capillary: 89 mg/dL (ref 70–99)

## 2022-11-13 LAB — BASIC METABOLIC PANEL
Anion gap: 9 (ref 5–15)
BUN: 57 mg/dL — ABNORMAL HIGH (ref 8–23)
CO2: 25 mmol/L (ref 22–32)
Calcium: 7.9 mg/dL — ABNORMAL LOW (ref 8.9–10.3)
Chloride: 102 mmol/L (ref 98–111)
Creatinine, Ser: 3.36 mg/dL — ABNORMAL HIGH (ref 0.61–1.24)
GFR, Estimated: 20 mL/min — ABNORMAL LOW (ref >60)
Glucose, Bld: 49 mg/dL — ABNORMAL LOW (ref 70–99)
Potassium: 3.3 mmol/L — ABNORMAL LOW (ref 3.5–5.1)
Sodium: 136 mmol/L (ref 135–145)

## 2022-11-13 LAB — MAGNESIUM: Magnesium: 2.2 mg/dL (ref 1.7–2.4)

## 2022-11-13 MED ORDER — POTASSIUM CHLORIDE CRYS ER 20 MEQ PO TBCR
40.0000 meq | EXTENDED_RELEASE_TABLET | Freq: Once | ORAL | Status: AC
  Administered 2022-11-13: 40 meq via ORAL
  Filled 2022-11-13: qty 2

## 2022-11-13 MED ORDER — DEXTROSE 50 % IV SOLN
INTRAVENOUS | Status: AC
  Filled 2022-11-13: qty 50

## 2022-11-13 MED ORDER — POTASSIUM CHLORIDE CRYS ER 20 MEQ PO TBCR: 20.0000 meq | EXTENDED_RELEASE_TABLET | Freq: Every day | ORAL | 0 refills | Status: DC

## 2022-11-13 MED ORDER — CHLORHEXIDINE GLUCONATE CLOTH 2 % EX PADS
6.0000 | MEDICATED_PAD | Freq: Every day | CUTANEOUS | Status: DC
  Administered 2022-11-13: 6 via TOPICAL

## 2022-11-13 MED ORDER — DEXTROSE 50 % IV SOLN
25.0000 g | INTRAVENOUS | Status: AC
  Administered 2022-11-13: 25 g via INTRAVENOUS

## 2022-11-13 MED ORDER — SPIRONOLACTONE 25 MG PO TABS: 25.0000 mg | ORAL_TABLET | Freq: Every day | ORAL | Status: AC

## 2022-11-13 MED ORDER — TORSEMIDE 40 MG PO TABS: 40.0000 mg | ORAL_TABLET | Freq: Every day | ORAL | Status: DC

## 2022-11-13 NOTE — Discharge Summary (Signed)
Physician Discharge Summary  Manuel Kramer QMV:[id number] DOB: [birth year] DOA: 11/08/2022  PCP: Manuel ProvidenceAnderson, Takela N, FNP  Admit date: 11/08/2022 Discharge date: 11/13/2022  Admitted From: Skilled nursing facility  Discharge disposition: Skilled nursing facility  Recommendations for Outpatient Follow-Up:   Follow up with your primary care provider at the skilled nursing facility in 3 to 5 days Check CBC, BMP, magnesium in the next visit Losartan has been discontinued.  Spironolactone and torsemide on hold until 11/16/2022.  Please check BMP prior to reinitiating.   Discharge Diagnosis:   Principal Problem:   Sepsis due to undetermined organism [facility](HCC) Active Problems:   Seizure disorder (HCC)   Acute renal failure superimposed on stage 3b chronic kidney disease (HCC)   Type 2 diabetes mellitus with complication, with long-term current use of insulin (HCC)   Depression with anxiety   Hyperlipidemia   Anemia of chronic disease   Diabetic neuropathy (HCC)   Acute on chronic diastolic congestive heart failure (HCC)   Hypoglycemia   Essential hypertension   History of CVA (cerebrovascular accident)   Discharge Condition: Improved.  Diet recommendation: Low sodium, heart healthy.  Carbohydrate-modified.  Pured diet.  Wound care: None.  Code status: Full.   History of Present Illness:   Manuel Kramer is a 67 y.o. male with past medical history significant of chronic diarrhea, history of C. 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  In the ED, patient was febrile with a temperature of 101 F and was  mildly hypertensive.  Urinalysis showed glycosuria proteinuria more than 50 white cells with budding yeast.  He did have mild leukocytosis at 12.2.  Coronavirus influenza and RSV PCR was negative but BNP was elevated at 2183.  Lactate was elevated at 2.1 followed by 1.4.  Creatinine elevated at 3.6.  Portable chest x-ray showed enlarged pericardial silhouette with diffuse vascular congestion.  Patient was then given IV fluid bolus ceftriaxone and was considered for admission to the hospital for further workup and treatment.    Hospital Course:   Following conditions were addressed during hospitalization as listed below,  Sepsis likely due to pneumonia, staph epidermidis bacteremia. Patient with abnormal urinalysis as well.  Patient completed course of Rocephin and Zithromax during hospitalization.  Strep urinary antigen negative.  Urine culture was negative.  Blood cultures showing methicillin-resistant staph epidermidis and Staph hominis likely contaminant.  Spoke with ID.  Since his blood cultures have been negative no plans for further testing.  Leukocytosis has resolved at this time..  CT scan of the chest shows a small bilateral pleural effusion with diffuse perihilar hazy groundglass opacity with interstitial thickening anasarca.       Acute renal failure superimposed on stage 3b chronic kidney disease  Discontinue the losartan.  Hold off torsemide and spironolactone until 11/16/2022.  Creatinine today at 3.3 from initial 4.0.  Baseline creatinine around at  2.0.  Received IV fluids during hospitalization.  Check BMP in the next visit.  Mild hypokalemia.  Will replenish prior to discharge.  Check BMP in few days.  Potassium will be prescribed for next  5 days     Acute on chronic diastolic congestive heart failure  Start diuretic on 11/16/2022.  Discontinued losartan due to renal failure.   2D echocardiogram with LV ejection fraction of 60 to 65% with grade 2 diastolic dysfunction..  Continue  Coreg.      Essential hypertension  Continue Coreg, clonidine, amlodipine hydralazine, Isordil.  Resume torsemide and spironolactone 11/16/2022     Type 2 diabetes mellitus with complication,  with long-term current use of insulin Patient will resume insulin regimen on discharge.  Had initial hypoglycemia which has improved    Depression with anxiety Continue sertraline.     Hyperlipidemia Continue Lipitor     Anemia of chronic disease Monitor hematocrit and hemoglobin.  Latest hemoglobin at 7.5.  MCV normal.  Likely anemia of chronic kidney disease.    Diabetic neuropathy with hyperglycemia. Continue gabapentin.       Seizure disorder (HCC) Continue Keppra.  Seizure precautions.      History of CVA (cerebrovascular accident)  Continue Plavix Lipitor.  Continue rehabilitation at skilled nursing facility.  Disposition.  At this time, patient is stable for disposition to skilled nursing facility.  Medical Consultants:   Verbal consult with infectious disease  Procedures:    None Subjective:   Today, patient was seen and examined at bedside.  Denies interval complaints.  Overall poor historian.  Denies any nausea vomiting fever chills or rigor.  Oriented to place.  Discharge Exam:   Vitals:   11/12/22 2220 11/13/22 0450  BP: 139/87 (!) 141/107  Pulse: (!) 57 (!) 58  Resp: 20 20  Temp:  (!) 97.3 F (36.3 C)  SpO2: 99% 100%   Vitals:   11/12/22 2006 11/12/22 2220 11/13/22 0450 11/13/22 0500  BP: 137/79 139/87 (!) 141/107   Pulse: (!) 56 (!) 57 (!) 58   Resp: Temp:   (!) 97.3 F (36.3 C)   TempSrc:      SpO2: 100% 99% 100%   Weight:    74.5 kg  Height:       Body mass index is 26.51 kg/m.  General: Alert awake, not in obvious distress HENT: pupils equally reacting to light,  No scleral pallor or icterus noted. Oral mucosa is moist.  Chest: .  Diminished breath sounds bilaterally. No crackles or wheezes.  CVS: S1 &S2 heard. No murmur.  Regular  rate and rhythm. Abdomen: Soft, nontender, nondistended.  Bowel sounds are heard.   Extremities: No cyanosis, clubbing or edema.  Peripheral pulses are palpable. Psych: Alert, awake and oriented to place, Communicative, CNS: Communicative, alert awake and oriented to place, moves extremities. Skin: Warm and dry.  No rashes noted.  The results of significant diagnostics from this hospitalization (including imaging, microbiology, ancillary and laboratory) are listed below for reference.     Diagnostic Studies:   CT CHEST WO CONTRAST  Result Date: 11/09/2022 CLINICAL DATA:  Respiratory illness.  Fever and productive cough. EXAM: CT CHEST WITHOUT CONTRAST TECHNIQUE: Multidetector CT imaging of the chest was performed following the standard protocol without IV contrast. RADIATION DOSE REDUCTION: This exam was performed according to the departmental dose-optimization program which includes automated exposure control, adjustment of the mA and/or kV according to patient size and/or use of iterative reconstruction technique. COMPARISON:  X-ray 11/08/2022.  CT 05/08/2022.  Older exams as well FINDINGS: Cardiovascular: The heart is enlarged. Small pericardial effusion. Normal caliber thoracic aorta with some vascular calcifications including along the coronary arteries. Please correlate with other coronary risk factors. There is some enlargement of the main pulmonary arteries. Please correlate for pulmonary artery hypertension. Mediastinum/Nodes:  Slightly patulous thoracic esophagus. There may be some debris along the upper thoracic esophagus as well. On this non IV contrast exam there is no specific abnormal lymph node enlargement seen in the axillary region, hilum or mediastinum. A few small less than 1 cm in size nodes are seen, not pathologic by size criteria. Lungs/Pleura: Small right and tiny left pleural effusion identified. Extensive breathing motion throughout the examination. Diffuse bilateral perihilar  lung opacities are identified, slightly right greater than left. Infiltrate versus edema is possible. There is some interstitial septal thickening. No pneumothorax. The ill-defined lung opacities are new from prior. Upper Abdomen: The adrenal glands are incompletely included in the imaging field. There is some air noted overlying the central left hepatic lobe. This could represent air in the biliary tree but indeterminate on this examination. Please correlate with history. Musculoskeletal: Mild degenerative changes along the spine. Anasarca. Focal lipoma anterior to the sternum on image 59 of series 2. IMPRESSION: Small right and tiny left pleural effusion.  Mild adjacent opacity. Diffuse perihilar hazy ground-glass opacities with interstitial septal thickening. The heart is also enlarged. Please correlate for evidence of edema. Recommend follow-up. Anasarca. Slightly patulous esophagus with some debris in the upper thoracic esophagus. Please correlate with symptoms. Area of air noted within the liver on the left side. This could be within the biliary tree but indeterminate. Please correlate for any history and dedicated evaluation when appropriate such as abdominal imaging. Preferably with contrast. Patient does have provided history of possible sepsis. Coronary artery calcifications. Please correlate for other coronary risk factors. Aortic Atherosclerosis (ICD10-I70.0). Electronically Signed   By: Karen Kays M.D.   On: 11/09/2022 12:09   ECHOCARDIOGRAM COMPLETE  Result Date: 11/09/2022    ECHOCARDIOGRAM REPORT   Patient Name:   Manuel Kramer Date of Exam: 11/09/2022 Medical Rec #:  [id number]        Height:       66.0 in Accession #:    [id number]       Weight:       164.4 lb Date of Birth:  [birth year]        BSA:          1.840 m Patient Age:    66 years         BP:           147/79 mmHg Patient Gender: M                HR:           73 bpm. Exam Location:  Inpatient Procedure: 2D Echo Indications:    CHF   History:        Patient has prior history of Echocardiogram examinations, most                 recent 05/10/2022. Stroke; Risk Factors:Dyslipidemia and                 Diabetes.  Sonographer:    Cathie Hoops Referring Phys: [id number] DAVID Manuel ORTIZ IMPRESSIONS  1. Left ventricular ejection fraction, by estimation, is 60 to 65%. The left ventricle has normal function. The left ventricle has no regional wall motion abnormalities. There is severe concentric left ventricular hypertrophy. Left ventricular diastolic  parameters are consistent with Grade II diastolic dysfunction (pseudonormalization).  2. Right ventricular systolic function is normal. The right ventricular size is normal. The estimated right ventricular systolic pressure is 50.0 mmHg.  3. Left atrial size was mild to moderately dilated.  4. The mitral valve is grossly normal. Mild mitral valve regurgitation.  5. The aortic valve is tricuspid. There is moderate calcification of the aortic valve. There is severe thickening of the aortic valve. Aortic valve regurgitation is mild to moderate. Aortic valve sclerosis/calcification is present, without any evidence of aortic stenosis. Aortic regurgitation PHT measures 308 msec. Aortic valve area, by VTI measures 2.07 cm. Aortic valve mean gradient measures 7.3 mmHg. Aortic valve Vmax measures 1.88 m/s.  6. The inferior vena cava is dilated in size with <50% respiratory variability, suggesting right atrial pressure of 15 mmHg. Comparison(s): Compared to prior TTE on 04/2022, the filling pressures are elevated on current study. FINDINGS  Left Ventricle: Left ventricular ejection fraction, by estimation, is 60 to 65%. The left ventricle has normal function. The left ventricle has no regional wall motion abnormalities. The left ventricular internal cavity size was normal in size. There is  severe concentric left ventricular hypertrophy. Left ventricular diastolic parameters are consistent with Grade II diastolic  dysfunction (pseudonormalization). Right Ventricle: The right ventricular size is normal. No increase in right ventricular wall thickness. Right ventricular systolic function is normal. The tricuspid regurgitant velocity is 2.96 m/s, and with an assumed right atrial pressure of 15 mmHg, the estimated right ventricular systolic pressure is 40.9 mmHg. Left Atrium: Left atrial size was mild to moderately dilated. Right Atrium: Right atrial size was normal in size. Pericardium: There is no evidence of pericardial effusion. Mitral Valve: The mitral valve is grossly normal. There is mild thickening of the mitral valve leaflet(s). There is mild calcification of the mitral valve leaflet(s). Mild to moderate mitral annular calcification. Mild mitral valve regurgitation. Tricuspid Valve: The tricuspid valve is normal in structure. Tricuspid valve regurgitation is mild. Aortic Valve: The aortic valve is tricuspid. There is moderate calcification of the aortic valve. There is severe thickening of the aortic valve. Aortic valve regurgitation is mild to moderate. Aortic regurgitation PHT measures 308 msec. Aortic valve sclerosis/calcification is present, without any evidence of aortic stenosis. Aortic valve mean gradient measures 7.3 mmHg. Aortic valve peak gradient measures 14.1 mmHg. Aortic valve area, by VTI measures 2.07 cm. Pulmonic Valve: The pulmonic valve was normal in structure. Pulmonic valve regurgitation is trivial. Aorta: The aortic root is normal in size and structure. Venous: The inferior vena cava is dilated in size with less than 50% respiratory variability, suggesting right atrial pressure of 15 mmHg. IAS/Shunts: The atrial septum is grossly normal.  LEFT VENTRICLE PLAX 2D LVIDd:         5.00 cm      Diastology LVIDs:         3.70 cm      LV e' medial:    5.98 cm/s LV PW:         1.50 cm      LV E/e' medial:  17.2 LV IVS:        1.40 cm      LV e' lateral:   7.40 cm/s LVOT diam:     1.90 cm      LV E/e'  lateral: 13.9 LV SV:         74 LV SV Index:   40 LVOT Area:     2.84 cm  LV Volumes (MOD) LV vol d, MOD A2C: 137.0 ml LV vol d, MOD A4C: 171.0 ml LV vol s, MOD A2C: 66.4 ml LV vol s, MOD A4C: 82.4 ml LV SV MOD A2C:     70.6 ml LV SV MOD  A4C:     171.0 ml LV SV MOD BP:      76.1 ml RIGHT VENTRICLE RV Basal diam:  4.00 cm RV Mid diam:    3.10 cm RV S prime:     11.90 cm/s TAPSE (M-mode): 1.7 cm LEFT ATRIUM           Index        RIGHT ATRIUM           Index LA diam:      3.60 cm 1.96 cm/m   RA Area:     13.30 cm LA Vol (A2C): 61.7 ml 33.53 ml/m  RA Volume:   27.50 ml  14.95 ml/m LA Vol (A4C): 63.5 ml 34.51 ml/m  AORTIC VALVE                     PULMONIC VALVE AV Area (Vmax):    2.01 cm      PV Vmax:          1.30 m/s AV Area (Vmean):   1.94 cm      PV Peak grad:     6.8 mmHg AV Area (VTI):     2.07 cm      PR End Diast Vel: 4.24 msec AV Vmax:           188.00 cm/s AV Vmean:          126.667 cm/s AV VTI:            0.357 m AV Peak Grad:      14.1 mmHg AV Mean Grad:      7.3 mmHg LVOT Vmax:         133.00 cm/s LVOT Vmean:        86.500 cm/s LVOT VTI:          0.261 m LVOT/AV VTI ratio: 0.73 AI PHT:            308 msec  AORTA Ao Root diam: 3.50 cm Ao Asc diam:  2.80 cm MITRAL VALVE                TRICUSPID VALVE MV Area (PHT): 4.39 cm     TR Peak grad:   35.0 mmHg MV Decel Time: 173 msec     TR Vmax:        296.00 cm/s MR Peak grad: 61.6 mmHg MR Vmax:      392.33 cm/s   SHUNTS MV E velocity: 103.00 cm/s  Systemic VTI:  0.26 m MV A velocity: 76.30 cm/s   Systemic Diam: 1.90 cm MV E/A ratio:  1.35 Laurance Flatten MD Electronically signed by Laurance Flatten MD Signature Date/Time: 11/09/2022/9:38:14 AM    Final    DG Chest Port 1 View  Result Date: 11/08/2022 CLINICAL DATA:  Shortness of breath and weakness EXAM: PORTABLE CHEST 1 VIEW COMPARISON:  X-ray 05/07/2022.  CT 05/08/2022.  Older exams as well. FINDINGS: Enlarged cardiopericardial silhouette with vascular congestion mild opacity. No pneumothorax,  effusion. Overlapping cardiac leads. IMPRESSION: Enlarged cardiopericardial silhouette with diffuse vascular congestion. Electronically Signed   By: Karen Kays M.D.   On: 11/08/2022 10:30     Labs:   Basic Metabolic Panel: Recent Labs  Lab 11/08/22 1030 11/09/22 0534 11/10/22 1136 11/12/22 0556 11/13/22 0620  NA 141 141 138 136 136  K 3.5 3.6 3.8 3.5 3.3*  CL 107 109 105 101 102  CO2 26 24 22 24 25   GLUCOSE 63* 132* 286* 190* 49*  BUN 40*  46* 59* 59* 57*  CREATININE 3.65* 3.94* 4.03* 3.77* 3.36*  CALCIUM 8.1* 7.9* 7.7* 7.9* 7.9*  MG 1.9  --   --  1.9 2.2  PHOS 3.6  --   --   --   --    GFR Estimated Creatinine Clearance: 19.8 mL/min (A) (by C-G formula based on SCr of 3.36 mg/dL (H)). Liver Function Tests: Recent Labs  Lab 11/08/22 1030 11/09/22 0534  AST 29 28  ALT 29 30  ALKPHOS 62 59  BILITOT 0.6 0.3  PROT 5.5* 5.2*  ALBUMIN 2.4* 2.1*   No results for input(s): "LIPASE", "AMYLASE" in the last 168 hours. No results for input(s): "AMMONIA" in the last 168 hours. Coagulation profile Recent Labs  Lab 11/08/22 1030  INR 1.2    CBC: Recent Labs  Lab 11/08/22 1030 11/09/22 0534 11/10/22 0443 11/11/22 0949 11/12/22 0556 11/13/22 0620  WBC 12.2* 14.5* 18.7* 17.3* 11.0* 7.8  NEUTROABS 11.2* 12.5* 16.3* 14.8*  --   --   HGB 8.7* 7.6* 7.1* 7.0* 7.1* 7.5*  HCT 27.7* 24.7* 22.2* 21.6* 22.7* 23.5*  MCV 97.5 98.4 96.5 93.1 96.6 95.9  PLT 224 180 170 120* 177 207   Cardiac Enzymes: No results for input(s): "CKTOTAL", "CKMB", "CKMBINDEX", "TROPONINI" in the last 168 hours. BNP: Invalid input(s): "POCBNP" CBG: Recent Labs  Lab 11/12/22 1140 11/12/22 1732 11/12/22 2218 11/13/22 0729 11/13/22 0821  GLUCAP 260* 303* 125* 49* 89   D-Dimer No results for input(s): "DDIMER" in the last 72 hours. Hgb A1c No results for input(s): "HGBA1C" in the last 72 hours. Lipid Profile No results for input(s): "CHOL", "HDL", "LDLCALC", "TRIG", "CHOLHDL", "LDLDIRECT"  in the last 72 hours. Thyroid function studies No results for input(s): "TSH", "T4TOTAL", "T3FREE", "THYROIDAB" in the last 72 hours.  Invalid input(s): "FREET3" Anemia work up No results for input(s): "VITAMINB12", "FOLATE", "FERRITIN", "TIBC", "IRON", "RETICCTPCT" in the last 72 hours. Microbiology Recent Results (from the past 240 hour(s))  Resp panel by RT-PCR (RSV, Flu A&B, Covid) Anterior Nasal Swab     Status: None   Collection Time: 11/08/22  9:57 AM   Specimen: Anterior Nasal Swab  Result Value Ref Range Status   SARS Coronavirus 2 by RT PCR NEGATIVE NEGATIVE Final    Comment: (NOTE) SARS-CoV-2 target nucleic acids are NOT DETECTED.  The SARS-CoV-2 RNA is generally detectable in upper respiratory specimens during the acute phase of infection. The lowest concentration of SARS-CoV-2 viral copies this assay can detect is 138 copies/mL. A negative result does not preclude SARS-Cov-2 infection and should not be used as the sole basis for treatment or other patient management decisions. A negative result may occur with  improper specimen collection/handling, submission of specimen other than nasopharyngeal swab, presence of viral mutation(s) within the areas targeted by this assay, and inadequate number of viral copies(<138 copies/mL). A negative result must be combined with clinical observations, patient history, and epidemiological information. The expected result is Negative.  Fact Sheet for Patients:  BloggerCourse.com  Fact Sheet for Healthcare Providers:  SeriousBroker.it  This test is no t yet approved or cleared by the [place] FDA and  has been authorized for detection and/or diagnosis of SARS-CoV-2 by FDA under an Emergency Use Authorization (EUA). This EUA will remain  in effect (meaning this test can be used) for the duration of the COVID-19 declaration under Section 564(b)(1) of the Act, 21 U.S.C.section  360bbb-3(b)(1), unless the authorization is terminated  or revoked sooner.  Influenza A by PCR NEGATIVE NEGATIVE Final   Influenza B by PCR NEGATIVE NEGATIVE Final    Comment: (NOTE) The Xpert Xpress SARS-CoV-2/FLU/RSV plus assay is intended as an aid in the diagnosis of influenza from Nasopharyngeal swab specimens and should not be used as a sole basis for treatment. Nasal washings and aspirates are unacceptable for Xpert Xpress SARS-CoV-2/FLU/RSV testing.  Fact Sheet for Patients: BloggerCourse.com  Fact Sheet for Healthcare Providers: SeriousBroker.it  This test is not yet approved or cleared by the [place] FDA and has been authorized for detection and/or diagnosis of SARS-CoV-2 by FDA under an Emergency Use Authorization (EUA). This EUA will remain in effect (meaning this test can be used) for the duration of the COVID-19 declaration under Section 564(b)(1) of the Act, 21 U.S.C. section 360bbb-3(b)(1), unless the authorization is terminated or revoked.     Resp Syncytial Virus by PCR NEGATIVE NEGATIVE Final    Comment: (NOTE) Fact Sheet for Patients: BloggerCourse.com  Fact Sheet for Healthcare Providers: SeriousBroker.it  This test is not yet approved or cleared by the [place] FDA and has been authorized for detection and/or diagnosis of SARS-CoV-2 by FDA under an Emergency Use Authorization (EUA). This EUA will remain in effect (meaning this test can be used) for the duration of the COVID-19 declaration under Section 564(b)(1) of the Act, 21 U.S.C. section 360bbb-3(b)(1), unless the authorization is terminated or revoked.  Performed at [facility], [address] [place], [place] [address]   Blood Culture (routine x 2)     Status: Abnormal   Collection Time: 11/08/22 10:30 AM   Specimen: BLOOD  Result Value Ref Range Status    Specimen Description   Final    BLOOD SITE NOT SPECIFIED Performed at [facility], [address], [place], [place] [address]    Special Requests   Final    BOTTLES DRAWN AEROBIC AND ANAEROBIC Blood Culture results may not be optimal due to an inadequate volume of blood received in culture bottles Performed at [facility], [address] [place], [place] [address]    Culture  Setup Time   Final    GRAM POSITIVE COCCI IN BOTH AEROBIC AND ANAEROBIC BOTTLES CRITICAL VALUE NOTED.  VALUE IS CONSISTENT WITH PREVIOUSLY REPORTED AND CALLED VALUE. Performed at [facility], [address], [place], [place] [address]    Culture (A)  Final    STAPHYLOCOCCUS EPIDERMIDIS THE SIGNIFICANCE OF ISOLATING THIS ORGANISM FROM A SINGLE SET OF BLOOD CULTURES WHEN MULTIPLE SETS ARE DRAWN IS UNCERTAIN. PLEASE NOTIFY THE MICROBIOLOGY DEPARTMENT WITHIN ONE WEEK IF SPECIATION AND SENSITIVITIES ARE REQUIRED. STAPHYLOCOCCUS HOMINIS    Report Status 11/12/2022 FINAL  Final   Organism ID, Bacteria STAPHYLOCOCCUS HOMINIS  Final      Susceptibility   Staphylococcus hominis - MIC*    CIPROFLOXACIN >=8 RESISTANT Resistant     ERYTHROMYCIN RESISTANT Resistant     GENTAMICIN <=0.5 SENSITIVE Sensitive     OXACILLIN >=4 RESISTANT Resistant     TETRACYCLINE >=16 RESISTANT Resistant     VANCOMYCIN <=0.5 SENSITIVE Sensitive     TRIMETH/SULFA <=10 SENSITIVE Sensitive     CLINDAMYCIN RESISTANT Resistant     RIFAMPIN <=0.5 SENSITIVE Sensitive     Inducible Clindamycin POSITIVE Resistant     * STAPHYLOCOCCUS HOMINIS  Blood Culture (routine x 2)     Status: Abnormal   Collection Time: 11/08/22 10:39 AM   Specimen: BLOOD  Result Value Ref Range Status  Specimen Description   Final    BLOOD SITE NOT SPECIFIED Performed at [facility], [address] [place], [place] [address]    Special Requests   Final    BOTTLES DRAWN AEROBIC ONLY Blood Culture  adequate volume Performed at [facility], [address] [place], [place] [address]    Culture  Setup Time   Final    GRAM POSITIVE COCCI AEROBIC BOTTLE ONLY CRITICAL RESULT CALLED TO, READ BACK BY AND VERIFIED WITH: PHARMD T.GREEN AT [id number] ON 11/09/2022 BY T.SAAD.    Culture (A)  Final    STAPHYLOCOCCUS HOMINIS SUSCEPTIBILITIES PERFORMED ON PREVIOUS CULTURE WITHIN THE LAST 5 DAYS. Performed at [facility], [address] [place], [place] [address]    Report Status 11/12/2022 FINAL  Final  Blood Culture ID Panel (Reflexed)     Status: Abnormal   Collection Time: 11/08/22 10:39 AM  Result Value Ref Range Status   Enterococcus faecalis NOT DETECTED NOT DETECTED Final   Enterococcus Faecium NOT DETECTED NOT DETECTED Final   Listeria monocytogenes NOT DETECTED NOT DETECTED Final   Staphylococcus species DETECTED (A) NOT DETECTED Final    Comment: CRITICAL RESULT CALLED TO, READ BACK BY AND VERIFIED WITH: PHARMD T.GREEN AT [id number] ON 11/09/2022 BY T.SAAD.    Staphylococcus aureus (BCID) NOT DETECTED NOT DETECTED Final   Staphylococcus epidermidis DETECTED (A) NOT DETECTED Final    Comment: Methicillin (oxacillin) resistant coagulase negative staphylococcus. Possible blood culture contaminant (unless isolated from more than one blood culture draw or clinical case suggests pathogenicity). No antibiotic treatment is indicated for blood  culture contaminants. CRITICAL RESULT CALLED TO, READ BACK BY AND VERIFIED WITH: PHARMD T.GREEN AT [id number] ON 11/09/2022 BY T.SAAD.    Staphylococcus lugdunensis NOT DETECTED NOT DETECTED Final   Streptococcus species NOT DETECTED NOT DETECTED Final   Streptococcus agalactiae NOT DETECTED NOT DETECTED Final   Streptococcus pneumoniae NOT DETECTED NOT DETECTED Final   Streptococcus pyogenes NOT DETECTED NOT DETECTED Final   A.calcoaceticus-baumannii NOT DETECTED NOT DETECTED Final   Bacteroides fragilis NOT DETECTED NOT DETECTED Final    Enterobacterales NOT DETECTED NOT DETECTED Final   Enterobacter cloacae complex NOT DETECTED NOT DETECTED Final   Escherichia coli NOT DETECTED NOT DETECTED Final   Klebsiella aerogenes NOT DETECTED NOT DETECTED Final   Klebsiella oxytoca NOT DETECTED NOT DETECTED Final   Klebsiella pneumoniae NOT DETECTED NOT DETECTED Final   Proteus species NOT DETECTED NOT DETECTED Final   Salmonella species NOT DETECTED NOT DETECTED Final   Serratia marcescens NOT DETECTED NOT DETECTED Final   Haemophilus influenzae NOT DETECTED NOT DETECTED Final   Neisseria meningitidis NOT DETECTED NOT DETECTED Final   Pseudomonas aeruginosa NOT DETECTED NOT DETECTED Final   Stenotrophomonas maltophilia NOT DETECTED NOT DETECTED Final   Candida albicans NOT DETECTED NOT DETECTED Final   Candida auris NOT DETECTED NOT DETECTED Final   Candida glabrata NOT DETECTED NOT DETECTED Final   Candida krusei NOT DETECTED NOT DETECTED Final   Candida parapsilosis NOT DETECTED NOT DETECTED Final   Candida tropicalis NOT DETECTED NOT DETECTED Final   Cryptococcus neoformans/gattii NOT DETECTED NOT DETECTED Final   Methicillin resistance mecA/C DETECTED (A) NOT DETECTED Final    Comment: CRITICAL RESULT CALLED TO, READ BACK BY AND VERIFIED WITH: PHARMD T.GREEN AT [id number] ON 11/09/2022 BY T.SAAD. Performed at [facility], [address] [place], [place] [address]   Urine Culture     Status: None   Collection  Time: 11/08/22  3:00 PM   Specimen: In/Out Cath Urine  Result Value Ref Range Status   Specimen Description   Final    IN/OUT CATH URINE Performed at [facility], [place] [address] [place], [place] [address]    Special Requests   Final    NONE Performed at [facility], [place] [address] [place], [place] [address]    Culture   Final    NO GROWTH Performed at [facility], [place] [address], [place], [place] [address]    Report Status 11/09/2022 FINAL  Final  Culture,  blood (Routine X 2) w Reflex to ID Panel     Status: None (Preliminary result)   Collection Time: 11/10/22 12:55 PM   Specimen: BLOOD  Result Value Ref Range Status   Specimen Description   Final    BLOOD BLOOD RIGHT ARM Performed at [facility] [address] [place], [place] [address]    Special Requests   Final    BOTTLES DRAWN AEROBIC AND ANAEROBIC Blood Culture adequate volume Performed at [facility] [address], [place], [place] [address]    Culture   Final    NO GROWTH 2 DAYS Performed at [facility] [address], [place], [place] [address]    Report Status PENDING  Incomplete  Culture, blood (Routine X 2) w Reflex to ID Panel     Status: None (Preliminary result)   Collection Time: 11/10/22  1:00 PM   Specimen: BLOOD  Result Value Ref Range Status   Specimen Description   Final    BLOOD BLOOD LEFT ARM Performed at [facility] [address], [place], [place] [address]    Special Requests   Final    BOTTLES DRAWN AEROBIC AND ANAEROBIC Blood Culture adequate volume Performed at [facility] [address] [place], [place] [address]    Culture   Final    NO GROWTH 2 DAYS Performed at [facility] [address], [place], [place] [address]    Report Status PENDING  Incomplete     Discharge Instructions:   Discharge Instructions     Discharge instructions   Complete by: As directed    Follow-up with your primary care provider at the skilled nursing facility in 3 to 5 days. Check blood work at that time.   Increase activity slowly   Complete by: As directed       Allergies as of 11/13/2022       Reactions   Codeine Other (See Comments)   On MAR, unknown reaction   Iodinated Contrast Media Other (See Comments)   On MAR, unknown reaction   Pentazocine Other (See Comments)   On MAR, unknown reaction   Statins Other (See Comments)   On MAR, unknown reaction         Medication List     STOP taking these medications    losartan 100 MG tablet Commonly known as: COZAAR       TAKE these medications    acetaminophen 500 MG tablet Commonly known as: TYLENOL Take 1 tablet (500 mg total) by mouth every 6 (six) hours as needed.   amLODipine 10 MG tablet Commonly known as: NORVASC Take 1 tablet (10 mg total) by mouth daily.   aspirin EC 81 MG tablet Take 81 mg by mouth daily. Swallow whole.   atorvastatin 40 MG tablet Commonly known as: LIPITOR Take 1  tablet (40 mg total) by mouth daily.   carvedilol 25 MG tablet Commonly known as: COREG Take 1 tablet (25 mg total) by mouth 2 (two) times daily with a meal.   cholecalciferol 25 MCG (1000 UNIT) tablet Commonly known as: VITAMIN D3 Take 2,000 Units by mouth daily.   Cinnamon 500 MG capsule Take 500 mg by mouth daily.   cloNIDine 0.3 MG tablet Commonly known as: CATAPRES Take 1 tablet (0.3 mg total) by mouth 3 (three) times daily.   clopidogrel 75 MG tablet Commonly known as: PLAVIX Take 75 mg by mouth daily.   Dermacloud Oint Apply 1 Application topically in the morning, at noon, and at bedtime.   Desitin 40 % Pste Generic drug: Zinc Oxide Apply 1 Application topically 2 (two) times daily.   gabapentin 100 MG capsule Commonly known as: NEURONTIN Take 100 mg by mouth at bedtime.   Garlic 500 MG Caps Take 500 mg by mouth daily.   hydrALAZINE 100 MG tablet Commonly known as: APRESOLINE Take 1 tablet (100 mg total) by mouth every 8 (eight) hours.   insulin aspart 100 UNIT/ML injection Commonly known as: novoLOG Inject 3 Units into the skin 3 (three) times daily with meals. If eats more than 50% of meals What changed:  how much to take additional instructions   insulin glargine 100 UNIT/ML Solostar Pen Commonly known as: LANTUS Inject 12 Units into the skin at bedtime. What changed: how much to take   ipratropium 0.06 % nasal spray Commonly known as:  ATROVENT Place 2 sprays into both nostrils 2 (two) times daily.   isosorbide dinitrate 40 MG tablet Commonly known as: ISORDIL Take 40 mg by mouth 3 (three) times daily.   levETIRAcetam 500 MG tablet Commonly known as: KEPPRA Take 1 tablet (500 mg total) by mouth 2 (two) times daily.   loratadine 10 MG tablet Commonly known as: CLARITIN Take 10 mg by mouth every other day.   multivitamin with minerals Tabs tablet Take 1 tablet by mouth daily.   niacin 100 MG tablet Commonly known as: VITAMIN B3 Take 100 mg by mouth 2 (two) times daily.   potassium chloride SA 20 MEQ tablet Commonly known as: KLOR-CON M Take 1 tablet (20 mEq total) by mouth daily for 5 days.   sertraline 50 MG tablet Commonly known as: ZOLOFT Take 50 mg by mouth daily.   sodium bicarbonate 650 MG tablet Take 1,300 mg by mouth 2 (two) times daily.   spironolactone 25 MG tablet Commonly known as: ALDACTONE Take 1 tablet (25 mg total) by mouth daily. Start taking on: November 16, 2022 What changed: These instructions start on November 16, 2022. If you are unsure what to do until then, ask your doctor or other care provider.   Torsemide 40 MG Tabs Take 40 mg by mouth daily. Start taking on: November 16, 2022 What changed: These instructions start on November 16, 2022. If you are unsure what to do until then, ask your doctor or other care provider.          Time coordinating discharge: 39 minutes  Signed:  Nathifa Ritthaler  [facility] 11/13/2022, 10:31 AM

## 2022-11-13 NOTE — TOC Progression Note (Signed)
Transition of Care [facility]) - Progression Note    Patient Details  Name: Manuel Kramer MRN: [id number] Date of Birth: 1956-04-10  [facility]) CM/SW Contact  Purcell Mouton, RN Phone Number: 11/13/2022, 10:22 AM  Clinical Narrative:    Spoke with pt's daughter [name] on the telephone concerning pt returning to [facility]. Chairsse agreed with return, states she will go sign paperwork at [employer]. [facility] was called and states pt is LT, may return after family sign paperwork. Waiting for [facility].    Expected Discharge Plan: Home/Self Care Barriers to Discharge: Continued Medical Work up  Expected Discharge Plan and Services   Discharge Planning Services: CM Consult   Living arrangements for the past 2 months: Apartment                                       Social Determinants of Health (SDOH) Interventions SDOH Screenings   Tobacco Use: High Risk (11/08/2022)    Readmission Risk Interventions     No data to display

## 2022-11-13 NOTE — Progress Notes (Addendum)
Speech Language Pathology Treatment: Dysphagia  Patient Details Name: Manuel Kramer MRN: [id number] DOB: [birth year] Today's Date: 11/13/2022 Time: 1010-1030 SLP Time Calculation (min) (ACUTE ONLY): 20 min  Assessment / Plan / Recommendation Clinical Impression  Patient seen by SLP for skilled treatment focused on dysphagia goals. Patient was awake, alert and pleasant. Patient initially requested water but then asked for some juice so SLP retrieved some apple juice which was then thickened to nectar consistency. Patient followed basic cues for repositioning in bed and then was able to hold cup and give self sips of juice via straw sips. No overt s/s aspiration or penetration observed and voice remained clear. Per RN, his blood sugar was low (40) this morning but he was declining any PO's earlier this morning. She also reported that plan is for patient to discharge from hospital today and return to SNF ([facility]). SLP recommending to upgrade diet slightly from full liquids to Dys 1 (puree) solids and nectar thick liquids and for further advancement to be determined by SNF SLP. SLP will not s/o on patient in case discharge plans do not occur today.   HPI HPI: Manuel Kramer is a 67 y.o. male with past medical history significant of chronic diarrhea, history of C. 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   Swallow eval ordered as pt was not able to orally transit liquid earlier in the day- allowing them to spill from his mouth.  Chest imaging showed "Slightly patulous esophagus with some debris in the upper thoracic   esophagus. Please correlate with symptoms.     Area of air noted within the liver on the left side. This could be  within the biliary tree but indeterminate. Please correlate for any  history and dedicated evaluation when appropriate such as abdominal  imaging. Preferably with contrast. Patient does have provided  history of possible sepsis.     Coronary artery calcifications. Please correlate for other coronary  risk factors."  Pt was seen by SLP during admission in early 2023- initially started on puree/thin diet due to oral difficulties.      SLP Plan  Continue with current plan of care      Recommendations for follow up therapy are one component of a multi-disciplinary discharge planning process, led by the attending physician.  Recommendations may be updated based on patient status, additional functional criteria and insurance authorization.    Recommendations  Diet recommendations: Dysphagia 1 (puree);Nectar-thick liquid Liquids provided via: Cup;Straw Medication Administration: Crushed with puree Supervision: Patient able to self feed;Full supervision/cueing for compensatory strategies Compensations: Slow rate;Small sips/bites;Minimize environmental distractions Postural Changes and/or Swallow Maneuvers: Seated upright 90 degrees                Oral Care Recommendations: Oral care BID Follow Up Recommendations: Skilled nursing-short term rehab (<3 hours/day) Assistance recommended at discharge: Frequent or constant Supervision/Assistance SLP Visit Diagnosis: Dysphagia, oral phase (R13.11);Dysphagia, unspecified (R13.10) Plan: Continue with current plan of care          Sonia Baller, MA, CCC-SLP Speech Therapy

## 2022-11-13 NOTE — TOC Progression Note (Signed)
Transition of Care [facility]) - Progression Note    Patient Details  Name: [name] MRN: [id number] Date of Birth: 1956-05-22  Transition of Care [facility]) CM/SW Contact  Purcell Mouton, RN Phone Number: 11/13/2022, 11:54 AM  Clinical Narrative:     Manuel Kramer was called for transport back to [facility]/SNF. RN is aware.   Expected Discharge Plan: Home/Self Care Barriers to Discharge: Continued Medical Work up  Expected Discharge Plan and Services   Discharge Planning Services: CM Consult   Living arrangements for the past 2 months: Apartment Expected Discharge Date: 11/13/22                                     Social Determinants of Health (SDOH) Interventions SDOH Screenings   Tobacco Use: High Risk (11/08/2022)    Readmission Risk Interventions     No data to display

## 2022-11-13 NOTE — Inpatient Diabetes Management (Signed)
Inpatient Diabetes Program Recommendations  AACE/ADA: New Consensus Statement on Inpatient Glycemic Control (2015)  Target Ranges:  Prepandial:   less than 140 mg/dL      Peak postprandial:   less than 180 mg/dL (1-2 hours)      Critically ill patients:  140 - 180 mg/dL   Lab Results  Component Value Date   GLUCAP 89 11/13/2022   HGBA1C 6.0 (H) 11/08/2022    Review of Glycemic Control  Diabetes history: DM2 Outpatient Diabetes medications: Lantus 10 units QHS, Novolog 3-11 units TID Current orders for Inpatient glycemic control: Semglee 12 QHS, Novolog 0-15 TID with meals and 0-5 HS + 3 units TID  Hgb1C - 6% Post-prandials elevated Hypoglycemia this am of 49.  Inpatient Diabetes Program Recommendations:    Decrease Semglee to 10 units QHS  Continue to follow glucose trends.  Thank you. Lorenda Peck, RD, LDN, [place] Inpatient Diabetes Coordinator [phone]

## 2022-11-13 NOTE — NC FL2 (Signed)
[place] LEVEL OF CARE FORM     IDENTIFICATION  Patient Name: Manuel Kramer Birthdate: [birth year] Sex: male Admission Date (Current Location): 11/08/2022  [facility] and [place] Number:  Herbalist and Address:  [facility],  [place], [place]      Provider Number: [id number]  Attending Physician Name and Address:  Flora Lipps, MD  Relative Name and Phone Number:  Savian, Mazon Daughter  [phone]   [phone]    Current Level of Care: Hospital Recommended Level of Care: [place] Prior Approval Number:    Date Approved/Denied:   PASRR Number: [id number]  Discharge Plan: SNF    Current Diagnoses: Patient Active Problem List   Diagnosis Date Noted   Sepsis due to undetermined organism ([place]) 11/08/2022   Acute on chronic diastolic congestive heart failure ([place]) 11/08/2022   Hypoglycemia 11/08/2022   Essential hypertension 11/08/2022   History of CVA (cerebrovascular accident) 11/08/2022   Acute ischemic stroke (HCC) > small acute to subacute pontine infarct seen on MRI 05/10/2022   Seizure disorder ([place]) 11/07/3233   Acute metabolic encephalopathy 57/32/2025   Pressure injury of skin 05/08/2022   Aspiration pneumonia ([place]) 05/08/2022   C. difficile colitis 01/26/2022   Acute renal failure superimposed on stage 3b chronic kidney disease ([place]) 01/25/2022   Bilateral lower extremity edema 01/25/2022   Type 2 diabetes mellitus with complication, with long-term current use of insulin ([place]) 01/25/2022   Depression with anxiety 01/25/2022   Physical deconditioning 01/25/2022   Left hip pain 01/25/2022   Anemia of chronic disease 01/25/2022   Chronic diastolic CHF (congestive heart failure) ([place]) 01/25/2022   Cerebral embolism with cerebral infarction 11/17/2021   Protein-calorie malnutrition, severe 11/16/2021   AKI (acute kidney injury) ([place])    Hyperglycemia     Hypertensive emergency without congestive heart failure    Sepsis ([place])    Hyperlipidemia 05/22/2016   Diabetic neuropathy ([place]) 05/22/2016   Other specified diabetes mellitus without complications ([place]) 42/70/6237   Hypertensive emergency 10/02/2014   Smoking 10/02/2014   Family history of prostate cancer 10/02/2014   Renal insufficiency 10/02/2014    Orientation RESPIRATION BLADDER Height & Weight     Self  Normal Incontinent, External catheter Weight: 74.5 kg Height:  5\' 6"  (167.6 cm)  BEHAVIORAL SYMPTOMS/MOOD NEUROLOGICAL BOWEL NUTRITION STATUS      Incontinent Diet (Full liquid)  AMBULATORY STATUS COMMUNICATION OF NEEDS Skin   Total Care Verbally Normal                       Personal Care Assistance Level of Assistance  Bathing, Feeding, Dressing, Total care Bathing Assistance: Maximum assistance Feeding assistance: Maximum assistance Dressing Assistance: Maximum assistance Total Care Assistance: Maximum assistance   Functional Limitations Info  Sight, Hearing, Speech Sight Info: Impaired Hearing Info: Adequate Speech Info: Adequate    SPECIAL CARE FACTORS FREQUENCY  PT (By licensed PT), OT (By licensed OT)     PT Frequency: Eval and Treat OT Frequency: Eval and Treat            Contractures Contractures Info: Not present    Additional Factors Info  Code Status, Allergies Code Status Info: FULL Allergies Info: Codeine, Iodinated Contrast Media, Pentazocine, Statins           Current Medications (11/13/2022):  This is the current hospital active medication list Current Facility-Administered Medications  Medication Dose Route Frequency Provider Last Rate Last Admin  acetaminophen (TYLENOL) tablet 650 mg  650 mg Oral Q6H PRN Reubin Milan, MD       Or   acetaminophen (TYLENOL) suppository 650 mg  650 mg Rectal Q6H PRN Reubin Milan, MD   650 mg at 11/08/22 2209   albuterol (PROVENTIL) (2.5 MG/3ML) 0.083% nebulizer solution 2.5 mg  2.5  mg Nebulization Q4H PRN Reubin Milan, MD       amLODipine (NORVASC) tablet 10 mg  10 mg Oral Daily Reubin Milan, MD   10 mg at 11/12/22 0914   aspirin EC tablet 81 mg  81 mg Oral Daily Reubin Milan, MD   81 mg at 11/12/22 0913   atorvastatin (LIPITOR) tablet 40 mg  40 mg Oral Daily Reubin Milan, MD   40 mg at 11/12/22 2229   carvedilol (COREG) tablet 25 mg  25 mg Oral BID WC Reubin Milan, MD   25 mg at 11/12/22 1725   Chlorhexidine Gluconate Cloth 2 % PADS 6 each  6 each Topical Daily Pokhrel, Laxman, MD       cholecalciferol (VITAMIN D3) 25 MCG (1000 UNIT) tablet 2,000 Units  2,000 Units Oral Daily Reubin Milan, MD   2,000 Units at 11/12/22 0912   cloNIDine (CATAPRES) tablet 0.3 mg  0.3 mg Oral TID Pokhrel, Corrie Mckusick, MD   0.3 mg at 11/12/22 2223   clopidogrel (PLAVIX) tablet 75 mg  75 mg Oral Daily Reubin Milan, MD   75 mg at 11/12/22 0914   gabapentin (NEURONTIN) capsule 100 mg  100 mg Oral QHS Reubin Milan, MD   100 mg at 11/12/22 2223   hydrALAZINE (APRESOLINE) tablet 100 mg  100 mg Oral Q8H Reubin Milan, MD   100 mg at 11/13/22 0507   insulin aspart (novoLOG) injection 0-15 Units  0-15 Units Subcutaneous TID WC Pokhrel, Laxman, MD   11 Units at 11/12/22 1851   insulin aspart (novoLOG) injection 0-5 Units  0-5 Units Subcutaneous QHS Pokhrel, Laxman, MD   2 Units at 11/11/22 2136   insulin aspart (novoLOG) injection 3 Units  3 Units Subcutaneous TID WC Pokhrel, Laxman, MD   3 Units at 11/12/22 1852   insulin glargine-yfgn (SEMGLEE) injection 12 Units  12 Units Subcutaneous QHS Pokhrel, Laxman, MD   12 Units at 11/12/22 2224   ipratropium (ATROVENT) 0.06 % nasal spray 2 spray  2 spray Each Nare BID Pokhrel, Corrie Mckusick, MD   2 spray at 11/12/22 0915   isosorbide dinitrate (ISORDIL) tablet 40 mg  40 mg Oral TID Reubin Milan, MD   40 mg at 11/12/22 2223   levETIRAcetam (KEPPRA) tablet 500 mg  500 mg Oral BID Reubin Milan, MD   500  mg at 11/12/22 2224   ondansetron (ZOFRAN) tablet 4 mg  4 mg Oral Q6H PRN Reubin Milan, MD       Or   ondansetron [facility]) injection 4 mg  4 mg Intravenous Q6H PRN Reubin Milan, MD       sertraline (ZOLOFT) tablet 50 mg  50 mg Oral Daily Reubin Milan, MD   50 mg at 11/12/22 7989   sodium bicarbonate tablet 1,300 mg  1,300 mg Oral BID Reubin Milan, MD   1,300 mg at 11/12/22 2223     Discharge Medications: Please see discharge summary for a list of discharge medications.  Relevant Imaging Results:  Relevant Lab Results:   Additional Information [id number]  Purcell Mouton, RN

## 2022-11-13 NOTE — NC FL2 (Addendum)
[place] LEVEL OF CARE FORM     IDENTIFICATION  Patient Name: Manuel Kramer Birthdate: 1956-09-18 Sex: male Admission Date (Current Location): 11/08/2022  [facility] and [place] Number:  Herbalist and Address:  [facility],  [place], [place]      Provider Number: [id number]  Attending Physician Name and Address:  Flora Lipps, MD  Relative Name and Phone Number:  Lanorris, Kalisz Daughter  [phone]   [phone]    Current Level of Care: Hospital Recommended Level of Care: [place] Prior Approval Number:    Date Approved/Denied:   PASRR Number: [id number]  Discharge Plan: SNF    Current Diagnoses: Patient Active Problem List   Diagnosis Date Noted   Sepsis due to undetermined organism ([place]) 11/08/2022   Acute on chronic diastolic congestive heart failure ([place]) 11/08/2022   Hypoglycemia 11/08/2022   Essential hypertension 11/08/2022   History of CVA (cerebrovascular accident) 11/08/2022   Acute ischemic stroke (HCC) > small acute to subacute pontine infarct seen on MRI 05/10/2022   Seizure disorder ([place]) 95/18/8416   Acute metabolic encephalopathy 60/63/0160   Pressure injury of skin 05/08/2022   Aspiration pneumonia ([place]) 05/08/2022   C. difficile colitis 01/26/2022   Acute renal failure superimposed on stage 3b chronic kidney disease ([place]) 01/25/2022   Bilateral lower extremity edema 01/25/2022   Type 2 diabetes mellitus with complication, with long-term current use of insulin ([place]) 01/25/2022   Depression with anxiety 01/25/2022   Physical deconditioning 01/25/2022   Left hip pain 01/25/2022   Anemia of chronic disease 01/25/2022   Chronic diastolic CHF (congestive heart failure) ([place]) 01/25/2022   Cerebral embolism with cerebral infarction 11/17/2021   Protein-calorie malnutrition, severe 11/16/2021   AKI (acute kidney injury) ([place])    Hyperglycemia     Hypertensive emergency without congestive heart failure    Sepsis ([place])    Hyperlipidemia 05/22/2016   Diabetic neuropathy ([place]) 05/22/2016   Other specified diabetes mellitus without complications ([place]) 10/93/2355   Hypertensive emergency 10/02/2014   Smoking 10/02/2014   Family history of prostate cancer 10/02/2014   Renal insufficiency 10/02/2014    Orientation RESPIRATION BLADDER Height & Weight     Self  Normal Incontinent, External catheter Weight: 74.5 kg Height:  5\' 6"  (167.6 cm)  BEHAVIORAL SYMPTOMS/MOOD NEUROLOGICAL BOWEL NUTRITION STATUS    Convulsions/Seizures Incontinent Diet (Full liquid), Diabetes  AMBULATORY STATUS COMMUNICATION OF NEEDS Skin   Total Care Verbally Normal                       Personal Care Assistance Level of Assistance  Bathing, Feeding, Dressing, Total care Bathing Assistance: Maximum assistance Feeding assistance: Maximum assistance Dressing Assistance: Maximum assistance Total Care Assistance: Maximum assistance   Functional Limitations Info  Sight, Hearing, Speech Sight Info: Impaired Hearing Info: Adequate Speech Info: Adequate    SPECIAL CARE FACTORS FREQUENCY  PT (By licensed PT), OT (By licensed OT)     PT Frequency: Eval and Treat OT Frequency: Eval and Treat            Contractures Contractures Info: Not present    Additional Factors Info  Code Status, Allergies Code Status Info: FULL Allergies Info: Codeine, Iodinated Contrast Media, Pentazocine, Statins           Current Medications (11/13/2022):  This is the current hospital active medication list Current Facility-Administered Medications  Medication Dose Route Frequency Provider Last Rate Last Admin  acetaminophen (TYLENOL) tablet 650 mg  650 mg Oral Q6H PRN Bobette Mo, MD       Or   acetaminophen (TYLENOL) suppository 650 mg  650 mg Rectal Q6H PRN Bobette Mo, MD   650 mg at 11/08/22 2209   albuterol (PROVENTIL) (2.5 MG/3ML) 0.083%  nebulizer solution 2.5 mg  2.5 mg Nebulization Q4H PRN Bobette Mo, MD       amLODipine (NORVASC) tablet 10 mg  10 mg Oral Daily Bobette Mo, MD   10 mg at 11/12/22 0914   aspirin EC tablet 81 mg  81 mg Oral Daily Bobette Mo, MD   81 mg at 11/12/22 0913   atorvastatin (LIPITOR) tablet 40 mg  40 mg Oral Daily Bobette Mo, MD   40 mg at 11/12/22 6962   carvedilol (COREG) tablet 25 mg  25 mg Oral BID WC Bobette Mo, MD   25 mg at 11/12/22 1725   Chlorhexidine Gluconate Cloth 2 % PADS 6 each  6 each Topical Daily Pokhrel, Laxman, MD       cholecalciferol (VITAMIN D3) 25 MCG (1000 UNIT) tablet 2,000 Units  2,000 Units Oral Daily Bobette Mo, MD   2,000 Units at 11/12/22 0912   cloNIDine (CATAPRES) tablet 0.3 mg  0.3 mg Oral TID Pokhrel, Rebekah Chesterfield, MD   0.3 mg at 11/12/22 2223   clopidogrel (PLAVIX) tablet 75 mg  75 mg Oral Daily Bobette Mo, MD   75 mg at 11/12/22 0914   gabapentin (NEURONTIN) capsule 100 mg  100 mg Oral QHS Bobette Mo, MD   100 mg at 11/12/22 2223   hydrALAZINE (APRESOLINE) tablet 100 mg  100 mg Oral Q8H Bobette Mo, MD   100 mg at 11/13/22 0507   insulin aspart (novoLOG) injection 0-15 Units  0-15 Units Subcutaneous TID WC Pokhrel, Laxman, MD   11 Units at 11/12/22 1851   insulin aspart (novoLOG) injection 0-5 Units  0-5 Units Subcutaneous QHS Pokhrel, Laxman, MD   2 Units at 11/11/22 2136   insulin aspart (novoLOG) injection 3 Units  3 Units Subcutaneous TID WC Pokhrel, Laxman, MD   3 Units at 11/12/22 1852   insulin glargine-yfgn (SEMGLEE) injection 12 Units  12 Units Subcutaneous QHS Pokhrel, Laxman, MD   12 Units at 11/12/22 2224   ipratropium (ATROVENT) 0.06 % nasal spray 2 spray  2 spray Each Nare BID Pokhrel, Rebekah Chesterfield, MD   2 spray at 11/12/22 0915   isosorbide dinitrate (ISORDIL) tablet 40 mg  40 mg Oral TID Bobette Mo, MD   40 mg at 11/12/22 2223   levETIRAcetam (KEPPRA) tablet 500 mg  500 mg Oral  BID Bobette Mo, MD   500 mg at 11/12/22 2224   ondansetron (ZOFRAN) tablet 4 mg  4 mg Oral Q6H PRN Bobette Mo, MD       Or   ondansetron [facility]) injection 4 mg  4 mg Intravenous Q6H PRN Bobette Mo, MD       potassium chloride SA (KLOR-CON M) CR tablet 40 mEq  40 mEq Oral Once Pokhrel, Laxman, MD       sertraline (ZOLOFT) tablet 50 mg  50 mg Oral Daily Bobette Mo, MD   50 mg at 11/12/22 9528   sodium bicarbonate tablet 1,300 mg  1,300 mg Oral BID Bobette Mo, MD   1,300 mg at 11/12/22 2223     Discharge Medications: Please see discharge summary for a  list of discharge medications.  Relevant Imaging Results:  Relevant Lab Results:   Additional Information [id number]  Purcell Mouton, RN

## 2022-11-15 LAB — CULTURE, BLOOD (ROUTINE X 2)
Culture: NO GROWTH
Culture: NO GROWTH
Special Requests: ADEQUATE
Special Requests: ADEQUATE

## 2022-11-16 ENCOUNTER — Inpatient Hospital Stay (HOSPITAL_COMMUNITY)
Admission: EM | Admit: 2022-11-16 | Discharge: 2022-11-21 | DRG: 689 | Disposition: A | Discharge status: Skilled Nursing Facility | Payer: Medicare Other | Source: Skilled Nursing Facility | Attending: Internal Medicine | Admitting: Internal Medicine

## 2022-11-16 ENCOUNTER — Encounter (HOSPITAL_COMMUNITY): Payer: Self-pay | Admitting: *Deleted

## 2022-11-16 ENCOUNTER — Other Ambulatory Visit: Payer: Self-pay

## 2022-11-16 ENCOUNTER — Emergency Department (HOSPITAL_COMMUNITY): Payer: Medicare Other

## 2022-11-16 DIAGNOSIS — R627 Adult failure to thrive: Secondary | ICD-10-CM | POA: Diagnosis present

## 2022-11-16 DIAGNOSIS — Z885 Allergy status to narcotic agent status: Secondary | ICD-10-CM

## 2022-11-16 DIAGNOSIS — N1832 Chronic kidney disease, stage 3b: Secondary | ICD-10-CM | POA: Diagnosis present

## 2022-11-16 DIAGNOSIS — N390 Urinary tract infection, site not specified: Principal | ICD-10-CM | POA: Diagnosis present

## 2022-11-16 DIAGNOSIS — Z1152 Encounter for screening for COVID-19: Secondary | ICD-10-CM

## 2022-11-16 DIAGNOSIS — Z79899 Other long term (current) drug therapy: Secondary | ICD-10-CM

## 2022-11-16 DIAGNOSIS — Z8614 Personal history of Methicillin resistant Staphylococcus aureus infection: Secondary | ICD-10-CM

## 2022-11-16 DIAGNOSIS — N319 Neuromuscular dysfunction of bladder, unspecified: Secondary | ICD-10-CM | POA: Diagnosis present

## 2022-11-16 DIAGNOSIS — Z8673 Personal history of transient ischemic attack (TIA), and cerebral infarction without residual deficits: Secondary | ICD-10-CM

## 2022-11-16 DIAGNOSIS — I69319 Unspecified symptoms and signs involving cognitive functions following cerebral infarction: Secondary | ICD-10-CM

## 2022-11-16 DIAGNOSIS — F418 Other specified anxiety disorders: Secondary | ICD-10-CM | POA: Diagnosis present

## 2022-11-16 DIAGNOSIS — Z888 Allergy status to other drugs, medicaments and biological substances status: Secondary | ICD-10-CM

## 2022-11-16 DIAGNOSIS — D631 Anemia in chronic kidney disease: Secondary | ICD-10-CM | POA: Diagnosis present

## 2022-11-16 DIAGNOSIS — I160 Hypertensive urgency: Secondary | ICD-10-CM | POA: Diagnosis present

## 2022-11-16 DIAGNOSIS — I5033 Acute on chronic diastolic (congestive) heart failure: Secondary | ICD-10-CM | POA: Diagnosis present

## 2022-11-16 DIAGNOSIS — F32A Depression, unspecified: Secondary | ICD-10-CM | POA: Diagnosis present

## 2022-11-16 DIAGNOSIS — I5031 Acute diastolic (congestive) heart failure: Secondary | ICD-10-CM | POA: Diagnosis not present

## 2022-11-16 DIAGNOSIS — G47419 Narcolepsy without cataplexy: Secondary | ICD-10-CM | POA: Diagnosis present

## 2022-11-16 DIAGNOSIS — I130 Hypertensive heart and chronic kidney disease with heart failure and stage 1 through stage 4 chronic kidney disease, or unspecified chronic kidney disease: Secondary | ICD-10-CM | POA: Diagnosis present

## 2022-11-16 DIAGNOSIS — E785 Hyperlipidemia, unspecified: Secondary | ICD-10-CM | POA: Diagnosis present

## 2022-11-16 DIAGNOSIS — N3000 Acute cystitis without hematuria: Secondary | ICD-10-CM | POA: Diagnosis not present

## 2022-11-16 DIAGNOSIS — E1122 Type 2 diabetes mellitus with diabetic chronic kidney disease: Secondary | ICD-10-CM | POA: Diagnosis present

## 2022-11-16 DIAGNOSIS — Z87891 Personal history of nicotine dependence: Secondary | ICD-10-CM | POA: Diagnosis not present

## 2022-11-16 DIAGNOSIS — F419 Anxiety disorder, unspecified: Secondary | ICD-10-CM | POA: Diagnosis present

## 2022-11-16 DIAGNOSIS — Z794 Long term (current) use of insulin: Secondary | ICD-10-CM | POA: Diagnosis not present

## 2022-11-16 DIAGNOSIS — R600 Localized edema: Secondary | ICD-10-CM | POA: Diagnosis present

## 2022-11-16 DIAGNOSIS — G9341 Metabolic encephalopathy: Secondary | ICD-10-CM | POA: Diagnosis present

## 2022-11-16 DIAGNOSIS — Z833 Family history of diabetes mellitus: Secondary | ICD-10-CM

## 2022-11-16 DIAGNOSIS — K224 Dyskinesia of esophagus: Secondary | ICD-10-CM | POA: Diagnosis present

## 2022-11-16 DIAGNOSIS — Z8249 Family history of ischemic heart disease and other diseases of the circulatory system: Secondary | ICD-10-CM

## 2022-11-16 DIAGNOSIS — Z91041 Radiographic dye allergy status: Secondary | ICD-10-CM

## 2022-11-16 DIAGNOSIS — D638 Anemia in other chronic diseases classified elsewhere: Secondary | ICD-10-CM | POA: Diagnosis present

## 2022-11-16 DIAGNOSIS — G40909 Epilepsy, unspecified, not intractable, without status epilepticus: Secondary | ICD-10-CM | POA: Diagnosis present

## 2022-11-16 DIAGNOSIS — Z960 Presence of urogenital implants: Secondary | ICD-10-CM | POA: Diagnosis present

## 2022-11-16 DIAGNOSIS — N400 Enlarged prostate without lower urinary tract symptoms: Secondary | ICD-10-CM | POA: Diagnosis present

## 2022-11-16 DIAGNOSIS — R4182 Altered mental status, unspecified: Secondary | ICD-10-CM | POA: Diagnosis not present

## 2022-11-16 DIAGNOSIS — R319 Hematuria, unspecified: Secondary | ICD-10-CM | POA: Diagnosis not present

## 2022-11-16 DIAGNOSIS — K529 Noninfective gastroenteritis and colitis, unspecified: Secondary | ICD-10-CM | POA: Diagnosis present

## 2022-11-16 DIAGNOSIS — I10 Essential (primary) hypertension: Secondary | ICD-10-CM | POA: Diagnosis present

## 2022-11-16 HISTORY — DX: Urinary tract infection, site not specified: N39.0

## 2022-11-16 LAB — RESP PANEL BY RT-PCR (RSV, FLU A&B, COVID)  RVPGX2
Influenza A by PCR: NEGATIVE
Influenza B by PCR: NEGATIVE
Resp Syncytial Virus by PCR: NEGATIVE
SARS Coronavirus 2 by RT PCR: NEGATIVE

## 2022-11-16 LAB — CBC WITH DIFFERENTIAL/PLATELET
Abs Immature Granulocytes: 0.27 10*3/uL — ABNORMAL HIGH (ref 0.00–0.07)
Basophils Absolute: 0 10*3/uL (ref 0.0–0.1)
Basophils Relative: 0 %
Eosinophils Absolute: 0.6 10*3/uL — ABNORMAL HIGH (ref 0.0–0.5)
Eosinophils Relative: 5 %
HCT: 23.5 % — ABNORMAL LOW (ref 39.0–52.0)
Hemoglobin: 7.4 g/dL — ABNORMAL LOW (ref 13.0–17.0)
Immature Granulocytes: 2 %
Lymphocytes Relative: 11 %
Lymphs Abs: 1.4 10*3/uL (ref 0.7–4.0)
MCH: 30.3 pg (ref 26.0–34.0)
MCHC: 31.5 g/dL (ref 30.0–36.0)
MCV: 96.3 fL (ref 80.0–100.0)
Monocytes Absolute: 0.8 10*3/uL (ref 0.1–1.0)
Monocytes Relative: 6 %
Neutro Abs: 9.9 10*3/uL — ABNORMAL HIGH (ref 1.7–7.7)
Neutrophils Relative %: 76 %
Platelets: 279 10*3/uL (ref 150–400)
RBC: 2.44 MIL/uL — ABNORMAL LOW (ref 4.22–5.81)
RDW: 15 % (ref 11.5–15.5)
WBC: 12.9 10*3/uL — ABNORMAL HIGH (ref 4.0–10.5)
nRBC: 0 % (ref 0.0–0.2)

## 2022-11-16 LAB — URINALYSIS, ROUTINE W REFLEX MICROSCOPIC
Bilirubin Urine: NEGATIVE
Glucose, UA: >=500 mg/dL — AB
Ketones, ur: NEGATIVE mg/dL
Nitrite: NEGATIVE
Protein, ur: >=300 mg/dL — AB
RBC / HPF: >50 RBC/hpf — ABNORMAL HIGH (ref 0–5)
Specific Gravity, Urine: 1.01 (ref 1.005–1.030)
WBC, UA: >50 WBC/hpf — ABNORMAL HIGH (ref 0–5)
pH: 6 (ref 5.0–8.0)

## 2022-11-16 LAB — COMPREHENSIVE METABOLIC PANEL
ALT: 26 U/L (ref 0–44)
AST: 16 U/L (ref 15–41)
Albumin: 2.3 g/dL — ABNORMAL LOW (ref 3.5–5.0)
Alkaline Phosphatase: 83 U/L (ref 38–126)
Anion gap: 8 (ref 5–15)
BUN: 55 mg/dL — ABNORMAL HIGH (ref 8–23)
CO2: 25 mmol/L (ref 22–32)
Calcium: 7.7 mg/dL — ABNORMAL LOW (ref 8.9–10.3)
Chloride: 105 mmol/L (ref 98–111)
Creatinine, Ser: 3.13 mg/dL — ABNORMAL HIGH (ref 0.61–1.24)
GFR, Estimated: 21 mL/min — ABNORMAL LOW (ref >60)
Glucose, Bld: 225 mg/dL — ABNORMAL HIGH (ref 70–99)
Potassium: 4.1 mmol/L (ref 3.5–5.1)
Sodium: 138 mmol/L (ref 135–145)
Total Bilirubin: 0.5 mg/dL (ref 0.3–1.2)
Total Protein: 5.9 g/dL — ABNORMAL LOW (ref 6.5–8.1)

## 2022-11-16 LAB — LACTIC ACID, PLASMA: Lactic Acid, Venous: 0.7 mmol/L (ref 0.5–1.9)

## 2022-11-16 LAB — TSH: TSH: 2.991 u[IU]/mL (ref 0.350–4.500)

## 2022-11-16 LAB — T4, FREE: Free T4: 1.15 ng/dL — ABNORMAL HIGH (ref 0.61–1.12)

## 2022-11-16 LAB — BRAIN NATRIURETIC PEPTIDE: B Natriuretic Peptide: 947.2 pg/mL — ABNORMAL HIGH (ref 0.0–100.0)

## 2022-11-16 LAB — AMMONIA: Ammonia: 14 umol/L (ref 9–35)

## 2022-11-16 MED ORDER — SODIUM BICARBONATE 650 MG PO TABS
1300.0000 mg | ORAL_TABLET | Freq: Two times a day (BID) | ORAL | Status: DC
  Administered 2022-11-17 – 2022-11-21 (×10): 1300 mg via ORAL
  Filled 2022-11-16 (×10): qty 2

## 2022-11-16 MED ORDER — FUROSEMIDE 10 MG/ML IJ SOLN
80.0000 mg | Freq: Two times a day (BID) | INTRAMUSCULAR | Status: DC
  Administered 2022-11-17 (×2): 80 mg via INTRAVENOUS
  Filled 2022-11-16 (×2): qty 8

## 2022-11-16 MED ORDER — ASPIRIN 81 MG PO TBEC
81.0000 mg | DELAYED_RELEASE_TABLET | Freq: Every day | ORAL | Status: DC
  Administered 2022-11-17 – 2022-11-21 (×5): 81 mg via ORAL
  Filled 2022-11-16 (×5): qty 1

## 2022-11-16 MED ORDER — SODIUM CHLORIDE 0.9 % IV SOLN
1.0000 g | Freq: Once | INTRAVENOUS | Status: AC
  Administered 2022-11-16: 1 g via INTRAVENOUS
  Filled 2022-11-16: qty 10

## 2022-11-16 MED ORDER — CARVEDILOL 25 MG PO TABS
25.0000 mg | ORAL_TABLET | Freq: Two times a day (BID) | ORAL | Status: DC
  Administered 2022-11-17 – 2022-11-21 (×10): 25 mg via ORAL
  Filled 2022-11-16 (×8): qty 1
  Filled 2022-11-16: qty 2
  Filled 2022-11-16: qty 1

## 2022-11-16 MED ORDER — ACETAMINOPHEN 650 MG RE SUPP: 650.0000 mg | Freq: Four times a day (QID) | RECTAL | Status: DC | PRN

## 2022-11-16 MED ORDER — ALBUTEROL SULFATE (2.5 MG/3ML) 0.083% IN NEBU: 2.5000 mg | INHALATION_SOLUTION | RESPIRATORY_TRACT | Status: DC | PRN

## 2022-11-16 MED ORDER — GABAPENTIN 100 MG PO CAPS
100.0000 mg | ORAL_CAPSULE | Freq: Every day | ORAL | Status: DC
  Administered 2022-11-17 (×2): 100 mg via ORAL
  Filled 2022-11-16 (×2): qty 1

## 2022-11-16 MED ORDER — LEVETIRACETAM 500 MG PO TABS
500.0000 mg | ORAL_TABLET | Freq: Two times a day (BID) | ORAL | Status: DC
  Administered 2022-11-17 – 2022-11-21 (×10): 500 mg via ORAL
  Filled 2022-11-16 (×10): qty 1

## 2022-11-16 MED ORDER — INSULIN ASPART 100 UNIT/ML IJ SOLN
0.0000 [IU] | Freq: Three times a day (TID) | INTRAMUSCULAR | Status: DC
  Administered 2022-11-17 – 2022-11-18 (×4): 1 [IU] via SUBCUTANEOUS
  Administered 2022-11-18: 2 [IU] via SUBCUTANEOUS
  Administered 2022-11-18: 1 [IU] via SUBCUTANEOUS
  Administered 2022-11-19 (×3): 2 [IU] via SUBCUTANEOUS
  Administered 2022-11-20 (×2): 1 [IU] via SUBCUTANEOUS
  Administered 2022-11-20: 2 [IU] via SUBCUTANEOUS
  Filled 2022-11-16: qty 0.06

## 2022-11-16 MED ORDER — SPIRONOLACTONE 25 MG PO TABS
25.0000 mg | ORAL_TABLET | Freq: Every day | ORAL | Status: DC
  Administered 2022-11-17: 25 mg via ORAL
  Filled 2022-11-16: qty 1

## 2022-11-16 MED ORDER — CLONIDINE HCL 0.2 MG PO TABS
0.3000 mg | ORAL_TABLET | Freq: Three times a day (TID) | ORAL | Status: DC
  Administered 2022-11-17 – 2022-11-21 (×15): 0.3 mg via ORAL
  Filled 2022-11-16 (×4): qty 1
  Filled 2022-11-16: qty 3
  Filled 2022-11-16 (×6): qty 1
  Filled 2022-11-16: qty 3
  Filled 2022-11-16 (×3): qty 1

## 2022-11-16 MED ORDER — LACTATED RINGERS IV BOLUS
1000.0000 mL | Freq: Once | INTRAVENOUS | Status: AC
  Administered 2022-11-16: 1000 mL via INTRAVENOUS

## 2022-11-16 MED ORDER — HYDRALAZINE HCL 25 MG PO TABS
100.0000 mg | ORAL_TABLET | Freq: Three times a day (TID) | ORAL | Status: DC
  Administered 2022-11-17 – 2022-11-21 (×15): 100 mg via ORAL
  Filled 2022-11-16 (×16): qty 4

## 2022-11-16 MED ORDER — ACETAMINOPHEN 325 MG PO TABS
650.0000 mg | ORAL_TABLET | Freq: Four times a day (QID) | ORAL | Status: DC | PRN
  Administered 2022-11-18: 650 mg via ORAL
  Filled 2022-11-16: qty 2

## 2022-11-16 MED ORDER — SODIUM CHLORIDE 0.9 % IV SOLN
1.0000 g | INTRAVENOUS | Status: DC
  Administered 2022-11-17: 1 g via INTRAVENOUS
  Filled 2022-11-16 (×2): qty 10

## 2022-11-16 MED ORDER — ISOSORBIDE DINITRATE 20 MG PO TABS
40.0000 mg | ORAL_TABLET | Freq: Three times a day (TID) | ORAL | Status: DC
  Administered 2022-11-17 – 2022-11-21 (×15): 40 mg via ORAL
  Filled 2022-11-16 (×15): qty 2

## 2022-11-16 MED ORDER — LACTATED RINGERS IV BOLUS: 500.0000 mL | Freq: Once | INTRAVENOUS | Status: DC

## 2022-11-16 NOTE — ED Provider Notes (Signed)
  Provider Note MRN:  [id number]  Arrival date & time: 11/17/22    ED Course and Medical Decision Making  Assumed care from [place] at shift change.  See note from prior team for complete details, in brief:  67 year old male history of hypertension, CKD, HFpEF, seizures on Keppra, CVA on Plavix arrived from [facility] home secondary to [place]. he was admitted 1/10, discharged 1/15 secondary to sepsis/pneumonia Hypothermic today, vitals otherwise stable.  Labs pending at shift change  Plan per prior physician upon labs, imaging, dispo  Labs reviewed, concerning for UTI.  Send urine culture, start Rocephin.  BNP is elevated, does not appear to be overtly volume overloaded on exam, he has chronic right lower extremity edema which she feels is unchanged at baseline.  No pulmonary edema on imaging.  He is breathing comfortably ambient air.  Creatinine is similar to his baseline, CKD Imaging stable  Neuroexam is nonfocal.  Patient remains confused.  Plan admission  Clinical Course as of 11/17/22 0053  Thu Nov 16, 2022  1919 WBC(!): 12.9 Leukocytosis, +UA, not septic; he is altered. Send urine culture, start rocephin for presumed complicated UTI in setting of indwelling foley. Foley was exchanged in the ED, urine specimen was taken from new foley.  [SG]  2032 Patient reassessed, he reports that he is feeling little better but he still confused.  He can tell me his name and his birthday but he is not sure where he is.  Unable to tell the month or describe why he came to the emergency department today. [SG]    Clinical Course User Index [SG] Jeanell Sparrow, DO    Discussed with Dr. Marcello Moores who accepts patient for admission  Procedures  Final Clinical Impressions(s) / ED Diagnoses     ICD-10-CM   1. Metabolic encephalopathy  J09.32     2. Urinary tract infection with hematuria, site unspecified  N39.0    R31.9       ED Discharge Orders     None       Discharge Instructions    None        Jeanell Sparrow, DO 11/17/22 [phone]

## 2022-11-16 NOTE — ED Provider Notes (Signed)
[facility] DEPT Provider Note   CSN: [id number] Arrival date & time: 11/16/22  1418     History  No chief complaint on file.  DEWON MENDIZABAL is a 67 y.o. male PMH of diabetes, HTN, CKD 3B, HLD, HFpEF, hx of c diff, HCV, T2DM, recurrent seizures on keppra, hx of CVA on plavix and lipitor. Recently admitted from 11/08/2022 to 11/13/2022 for sepsis 2/2 to pneumonia with blood cultures showing MRSA. Here from assisted living [facility] nursing home) due to some altered mental status today. Patient has been more altered per EMS took his medications however has decreased PO intake today.      Home Medications Prior to Admission medications   Medication Sig Start Date End Date Taking? Authorizing Provider  acetaminophen (TYLENOL) 500 MG tablet Take 1 tablet (500 mg total) by mouth every 6 (six) hours as needed. 08/22/16   Charlann Lange, PA-C  amLODipine (NORVASC) 10 MG tablet Take 1 tablet (10 mg total) by mouth daily. 01/05/17   Charlott Rakes, MD  aspirin EC 81 MG tablet Take 81 mg by mouth daily. Swallow whole.    [provider]  atorvastatin (LIPITOR) 40 MG tablet Take 1 tablet (40 mg total) by mouth daily. 12/06/21   Alma Friendly, MD  carvedilol (COREG) 25 MG tablet Take 1 tablet (25 mg total) by mouth 2 (two) times daily with a meal. 12/05/21   Alma Friendly, MD  cholecalciferol (VITAMIN D3) 25 MCG (1000 UNIT) tablet Take 2,000 Units by mouth daily.    [provider]  Cinnamon 500 MG capsule Take 500 mg by mouth daily.    [provider]  cloNIDine (CATAPRES) 0.3 MG tablet Take 1 tablet (0.3 mg total) by mouth 3 (three) times daily. 02/02/22   Rai, Vernelle Emerald, MD  clopidogrel (PLAVIX) 75 MG tablet Take 75 mg by mouth daily.    [provider]  DESITIN 40 % PSTE Apply 1 Application topically 2 (two) times daily. 04/24/22   [provider]  gabapentin (NEURONTIN) 100 MG capsule Take 100 mg by mouth at  bedtime.    [provider]  Garlic 400 MG CAPS Take 500 mg by mouth daily.    [provider]  hydrALAZINE (APRESOLINE) 100 MG tablet Take 1 tablet (100 mg total) by mouth every 8 (eight) hours. 12/05/21   Alma Friendly, MD  Infant [employer] Products [facility]) OINT Apply 1 Application topically in the morning, at noon, and at bedtime. 02/04/22   [provider]  insulin aspart (NOVOLOG) 100 UNIT/ML injection Inject 3 Units into the skin 3 (three) times daily with meals. If eats more than 50% of meals Patient taking differently: Inject 3-11 Units into the skin 3 (three) times daily with meals.  Per sliding scale:  CBG 101-150 = 2 units, CBG  151-200 = 3 units, 201-250 = 5 units, 251-300 = 7 units, 301-350 = 9 units, > 350 = 11 units call MD if CBG > 400 05/12/22   Adhikari, Amrit, MD  insulin glargine (LANTUS) 100 UNIT/ML Solostar Pen Inject 12 Units into the skin at bedtime. Patient taking differently: Inject 10 Units into the skin at bedtime. 05/12/22   Shelly Coss, MD  ipratropium (ATROVENT) 0.06 % nasal spray Place 2 sprays into both nostrils 2 (two) times daily.    [provider]  isosorbide dinitrate (ISORDIL) 40 MG tablet Take 40 mg by mouth 3 (three) times daily. 11/06/22   [provider]  levETIRAcetam (  KEPPRA) 500 MG tablet Take 1 tablet (500 mg total) by mouth 2 (two) times daily. 05/12/22   Burnadette Pop, MD  loratadine (CLARITIN) 10 MG tablet Take 10 mg by mouth every other day.    [provider]  Multiple Vitamin (MULTIVITAMIN WITH MINERALS) TABS tablet Take 1 tablet by mouth daily. 12/06/21   Briant Cedar, MD  niacin 100 MG tablet Take 100 mg by mouth 2 (two) times daily.    [provider]  potassium chloride SA (KLOR-CON M) 20 MEQ tablet Take 1 tablet (20 mEq total) by mouth daily for 5 days. 11/13/22 11/18/22  Pokhrel, Rebekah Chesterfield, MD  sertraline (ZOLOFT) 50 MG tablet Take 50 mg by mouth daily. 04/17/22   [provider]  sodium bicarbonate 650 MG tablet Take 1,300 mg by mouth 2 (two) times daily.    [provider]  spironolactone (ALDACTONE) 25 MG tablet Take 1 tablet (25 mg total) by mouth daily. 11/16/22   Pokhrel, Rebekah Chesterfield, MD  Torsemide 40 MG TABS Take 40 mg by mouth daily. 11/16/22   Pokhrel, Rebekah Chesterfield, MD      Allergies    Codeine, Iodinated contrast media, Pentazocine, and Statins    Review of Systems   Review of Systems  Constitutional:  Positive for activity change and appetite change. Negative for fever.  HENT:  Negative for congestion.   Cardiovascular:  Positive for leg swelling. Negative for chest pain.  Psychiatric/Behavioral:  Positive for confusion and decreased concentration.     Physical Exam Updated Vital Signs There were no vitals taken for this visit. Physical Exam Vitals and nursing note reviewed.  Constitutional:      General: He is not in acute distress.    Appearance: He is well-developed.  HENT:     Head: Normocephalic and atraumatic.  Eyes:     Conjunctiva/sclera: Conjunctivae normal.  Cardiovascular:     Rate and Rhythm: Normal rate and regular rhythm.     Heart sounds: No murmur heard. Pulmonary:     Effort: Pulmonary effort is normal. No respiratory distress.     Breath sounds: Normal breath sounds.  Abdominal:     Palpations: Abdomen is soft.     Tenderness: There is no abdominal tenderness.  Musculoskeletal:        General: No swelling.     Cervical back: Neck supple.  Skin:    General: Skin is warm and dry.     Capillary Refill: Capillary refill takes less than 2 seconds.  Neurological:     Mental Status: He is alert.  Psychiatric:        Mood and Affect: Mood normal.     ED Results / Procedures / Treatments   Labs (all labs ordered are listed, but only abnormal results are displayed) Labs Reviewed - No data to display  EKG None  Radiology No results found.  Procedures Procedures   Medications Ordered in  ED Medications - No data to display  ED Course/ Medical Decision Making/ A&P                            Medical Decision Making 67 year old male PMH of diabetes, HTN, CKD 3B, HLD, HFpEF, hx of c diff, HCV, T2DM, recurrent seizures on keppra, hx of CVA on plavix and lipitor here for altered mental status today.  Ddx: Alcohol- ETOH level pending, VBG pending, EKG with sinus rhythm, TSH and T4 ordered, CMP ordered to evaluate  for electrolytes/renal function, infection possible given recent MRSA bacteremia/PNA however CXR without pneumonia, does not appear to be on many sedating medications at SNF, did have some lower temps with rectal temp of 96.9, CBG on route in the 200s, CT head to evaluate for AMS without other cause, seizure possible as well with prolonged post ictal state given history of seizures and patient on keppra at home. Patient signed out to oncoming provider      Final Clinical Impression(s) / ED Diagnoses Final diagnoses:  None    Rx / DC Orders ED Discharge Orders     None         Gerrit Heck, MD 11/16/22 [place], [place], DO 11/16/22 1713

## 2022-11-16 NOTE — H&P (Addendum)
History and Physical    Manuel Kramer WER:[id number] DOB: September 19, 1956 DOA: 11/16/2022  PCP: Diamantina Providence, FNP  [facility]   I have personally briefly reviewed patient's old medical records in [facility]  Chief Complaint: change in mental status  HPI: Manuel Kramer is a 67 y.o. 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 Patient present today from NH with chief complaint of change in mental status per staff.  Patient currently poor historian unable to give history, he is however alert to to self and year.  Per notes patient was noted to be less responsive than his baseline, with decrease po intake.  ED Course:    BP 174/89, hr 66, rr , sat 100%  Wbc 12.9, hbg 7.4( at baseline, plt 279 Na 138, K 4.1 glu 225 cr 3.13 at new baseline, Lactic 0.7,  NH3:14 Tsh 1.15  Resp panel neg  Cxr: Enlarged cardiopericardial silhouette with increasing vascular congestion and trace edema UA: +wbc +LE, rare bacteria  BNP 947.2(2100) S/p 1L ns EKG: nsr, LVH CTH: NAD, noted prior infarct Ctab IMPRESSION: 1. Small fluid containing right inguinal hernia. 2. Subcutaneus soft tissue edema and mesenteric edema. 3. No acute intra-abdominal or intrapelvic abnormality with limited evaluation on this noncontrast study. 4. Cardiomegaly. 5. Prostatomegaly. 6.  Aortic Atherosclerosis (ICD10-I70.0). mReview of Systems: As per HPI otherwise 10 point review of systems negative.   Past Medical History:  Diagnosis Date   Chronic diarrhea    Diabetes mellitus without complication (HCC)    Hypertension     Past Surgical History:   Procedure Laterality Date   COLOSCOPY      POLYP     reports that he has been smoking cigarettes. He has a 20.00 pack-year smoking history. He has quit using smokeless tobacco. He reports that he does not currently use alcohol after a past usage of about 1.0 standard drink of alcohol per week. He reports that he does not use drugs.  Allergies  Allergen Reactions   Codeine Other (See Comments)    On MAR, unknown reaction   Iodinated Contrast Media Other (See Comments)    On MAR, unknown reaction   Pentazocine Other (See Comments)    On MAR, unknown reaction   Statins Other (See Comments)    On MAR, unknown reaction    Family History  Problem Relation Age of Onset   Cancer Mother    Diabetes Mother    Hypertension Mother    Cancer Father     Prior to Admission medications   Medication Sig Start Date End Date Taking? Authorizing Provider  acetaminophen (TYLENOL) 500 MG tablet Take 1 tablet (500 mg total) by mouth every 6 (six) hours as needed. 08/22/16   Elpidio Anis, PA-C  amLODipine (NORVASC) 10 MG tablet Take 1 tablet (10 mg total) by mouth daily. 01/05/17   Hoy Register, MD  aspirin EC 81 MG tablet Take 81 mg by mouth daily. Swallow whole.    [provider]  atorvastatin (LIPITOR) 40 MG tablet Take 1 tablet (40 mg total) by mouth daily. 12/06/21   Briant Cedar, MD  carvedilol (COREG) 25 MG tablet Take 1 tablet (25 mg total) by mouth 2 (two)  times daily with a meal. 12/05/21   Briant Cedar, MD  cholecalciferol (VITAMIN D3) 25 MCG (1000 UNIT) tablet Take 2,000 Units by mouth daily.    [provider]  Cinnamon 500 MG capsule Take 500 mg by mouth daily.    [provider]  cloNIDine (CATAPRES) 0.3 MG tablet Take 1 tablet (0.3 mg total) by mouth 3 (three) times daily. 02/02/22   Rai, Delene Ruffini, MD  clopidogrel (PLAVIX) 75 MG tablet Take 75 mg by mouth daily.    [provider]  DESITIN 40 % PSTE Apply 1 Application topically 2  (two) times daily. 04/24/22   [provider]  gabapentin (NEURONTIN) 100 MG capsule Take 100 mg by mouth at bedtime.    [provider]  Garlic 500 MG CAPS Take 500 mg by mouth daily.    [provider]  hydrALAZINE (APRESOLINE) 100 MG tablet Take 1 tablet (100 mg total) by mouth every 8 (eight) hours. 12/05/21   Briant Cedar, MD  [employer] [facility]) OINT Apply 1 Application topically in the morning, at noon, and at bedtime. 02/04/22   [provider]  insulin aspart (NOVOLOG) 100 UNIT/ML injection Inject 3 Units into the skin 3 (three) times daily with meals. If eats more than 50% of meals Patient taking differently: Inject 3-11 Units into the skin 3 (three) times daily with meals.  Per sliding scale:  CBG 101-150 = 2 units, CBG  151-200 = 3 units, 201-250 = 5 units, 251-300 = 7 units, 301-350 = 9 units, > 350 = 11 units call MD if CBG > 400 05/12/22   Adhikari, Amrit, MD  insulin glargine (LANTUS) 100 UNIT/ML Solostar Pen Inject 12 Units into the skin at bedtime. Patient taking differently: Inject 10 Units into the skin at bedtime. 05/12/22   Burnadette Pop, MD  ipratropium (ATROVENT) 0.06 % nasal spray Place 2 sprays into both nostrils 2 (two) times daily.    [provider]  isosorbide dinitrate (ISORDIL) 40 MG tablet Take 40 mg by mouth 3 (three) times daily. 11/06/22   [provider]  levETIRAcetam (KEPPRA) 500 MG tablet Take 1 tablet (500 mg total) by mouth 2 (two) times daily. 05/12/22   Burnadette Pop, MD  loratadine (CLARITIN) 10 MG tablet Take 10 mg by mouth every other day.    [provider]  Multiple Vitamin (MULTIVITAMIN WITH MINERALS) TABS tablet Take 1 tablet by mouth daily. 12/06/21   Briant Cedar, MD  niacin 100 MG tablet Take 100 mg by mouth 2 (two) times daily.    [provider]  potassium chloride SA (KLOR-CON M) 20 MEQ tablet Take 1 tablet (20 mEq total) by mouth daily for 5 days.  11/13/22 11/18/22  Pokhrel, Rebekah Chesterfield, MD  sertraline (ZOLOFT) 50 MG tablet Take 50 mg by mouth daily. 04/17/22   [provider]  sodium bicarbonate 650 MG tablet Take 1,300 mg by mouth 2 (two) times daily.    [provider]  spironolactone (ALDACTONE) 25 MG tablet Take 1 tablet (25 mg total) by mouth daily. 11/16/22   Pokhrel, Rebekah Chesterfield, MD  Torsemide 40 MG TABS Take 40 mg by mouth daily. 11/16/22   Joycelyn Das, MD    Physical Exam: Vitals:   11/16/22 2030 11/16/22 2109 11/16/22 2130 11/16/22 2200  BP: (!) 175/89 (!) 167/97 (!) 145/123 (!) 168/82  Pulse: 67 68 69 68  Resp: 11 13 13 10   Temp:    97.7 F (36.5 C)  TempSrc:      SpO2: 100% 100% 100% 99%    Constitutional: NAD, calm, comfortable Vitals:   11/16/22 2030 11/16/22 2109 11/16/22 2130 11/16/22 2200  BP: (!) 175/89 (!) 167/97 (!) 145/123 (!) 168/82  Pulse: 67 68 69 68  Resp: 11 13 13 10   Temp:    97.7 F (36.5 C)  TempSrc:      SpO2: 100% 100% 100% 99%   Eyes: PERRL, lids and conjunctivae normal ENMT: Mucous membranes are moist. Posterior pharynx clear of any exudate or lesions.Normal dentition.  Neck: normal, supple, no masses, no thyromegaly Respiratory: clear to auscultation bilaterally, no wheezing, no crackles. Normal respiratory effort. No accessory muscle use.  Cardiovascular: Regular rate and rhythm, no murmurs / rubs / gallops. + extremity edema. 2+ pedal pulses. Abdomen: no tenderness, no masses palpated. No hepatosplenomegaly. Bowel sounds positive.  Musculoskeletal: no clubbing / cyanosis. No joint deformity upper and lower extremities. Good ROM, no contractures. Normal muscle tone.  Skin: no rashes, lesions, ulcers. No induration Neurologic: CN 2-12 grossly intact. Sensation intact, DTR normal. Strength 5/5 in all 4.  Psychiatric: alert to self and time   Labs on Admission: I have personally reviewed following labs and imaging studies  CBC: Recent Labs  Lab 11/10/22 0443 11/11/22 0949  11/12/22 0556 11/13/22 0620 11/16/22 1459  WBC 18.7* 17.3* 11.0* 7.8 12.9*  NEUTROABS 16.3* 14.8*  --   --  9.9*  HGB 7.1* 7.0* 7.1* 7.5* 7.4*  HCT 22.2* 21.6* 22.7* 23.5* 23.5*  MCV 96.5 93.1 96.6 95.9 96.3  PLT 170 120* 177 207 454   Basic Metabolic Panel: Recent Labs  Lab 11/10/22 1136 11/12/22 0556 11/13/22 0620 11/16/22 1459  NA 138 136 136 138  K 3.8 3.5 3.3* 4.1  CL 105 101 102 105  CO2 22 24 25 25   GLUCOSE 286* 190* 49* 225*  BUN 59* 59* 57* 55*  CREATININE 4.03* 3.77* 3.36* 3.13*  CALCIUM 7.7* 7.9* 7.9* 7.7*  MG  --  1.9 2.2  --    GFR: Estimated Creatinine Clearance: 21.2 mL/min (A) (by C-G formula based on SCr of 3.13 mg/dL (H)). Liver Function Tests: Recent Labs  Lab 11/16/22 1459  AST 16  ALT 26  ALKPHOS 83  BILITOT 0.5  PROT 5.9*  ALBUMIN 2.3*   No results for input(s): "LIPASE", "AMYLASE" in the last 168 hours. Recent Labs  Lab 11/16/22 1500  AMMONIA 14   Coagulation Profile: No results for input(s): "INR", "PROTIME" in the last 168 hours. Cardiac Enzymes: No results for input(s): "CKTOTAL", "CKMB", "CKMBINDEX", "TROPONINI" in the last 168 hours. BNP (last 3 results) No results for input(s): "PROBNP" in the last 8760 hours. HbA1C: No results for input(s): "HGBA1C" in the last 72 hours. CBG: Recent Labs  Lab 11/12/22 1732 11/12/22 2218 11/13/22 0729 11/13/22 0821 11/13/22 1201  GLUCAP 303* 125* 49* 89 89   Lipid Profile: No results for input(s): "CHOL", "HDL", "LDLCALC", "TRIG", "CHOLHDL", "LDLDIRECT" in the last 72 hours. Thyroid Function Tests: Recent Labs    11/16/22 1459 11/16/22 1530  TSH  --  2.991  FREET4 1.15*  --    Anemia Panel: No results for input(s): "VITAMINB12", "FOLATE", "FERRITIN", "TIBC", "IRON", "RETICCTPCT" in the last 72 hours. Urine analysis:    Component Value Date/Time   COLORURINE YELLOW 11/16/2022 1800   APPEARANCEUR HAZY (A) 11/16/2022 1800   LABSPEC 1.010 11/16/2022 1800   PHURINE 6.0  11/16/2022 1800   GLUCOSEU >=500 (A) 11/16/2022 1800   HGBUR MODERATE (  A) 11/16/2022 1800   BILIRUBINUR NEGATIVE 11/16/2022 1800   KETONESUR NEGATIVE 11/16/2022 1800   PROTEINUR >=300 (A) 11/16/2022 1800   UROBILINOGEN 0.2 01/01/2015 1118   NITRITE NEGATIVE 11/16/2022 1800   LEUKOCYTESUR SMALL (A) 11/16/2022 1800    Radiological Exams on Admission: CT Renal Stone Study  Result Date: 11/16/2022 CLINICAL DATA:  Abdominal/flank pain, stone suspected EXAM: CT ABDOMEN AND PELVIS WITHOUT CONTRAST TECHNIQUE: Multidetector CT imaging of the abdomen and pelvis was performed following the standard protocol without IV contrast. RADIATION DOSE REDUCTION: This exam was performed according to the departmental dose-optimization program which includes automated exposure control, adjustment of the mA and/or kV according to patient size and/or use of iterative reconstruction technique. COMPARISON:  CT abdomen pelvis 02/21/2022 FINDINGS: Lower chest: Cardiomegaly. Possible bilateral trace pleural effusions. Bilateral lobe subsegmental atelectasis. Hepatobiliary: No focal liver abnormality. Contracted gallbladder. No gallstones, gallbladder wall thickening, or pericholecystic fluid. No biliary dilatation. Pancreas: No focal lesion. Normal pancreatic contour. No surrounding inflammatory changes. No main pancreatic ductal dilatation. Spleen: Nonspecific chronic calcification. Normal in size without focal abnormality. Adrenals/Urinary Tract: No adrenal nodule bilaterally. No nephrolithiasis and no hydronephrosis. Calcifications associated with the kidneys likely vascular. No ureterolithiasis or hydroureter. The urinary bladder is unremarkable. Stomach/Bowel: Stomach is within normal limits. No evidence of bowel wall thickening or dilatation. Appendix appears normal. Vascular/Lymphatic: No abdominal aorta or iliac aneurysm. Mild atherosclerotic plaque of the aorta and its branches. No abdominal, pelvic, or inguinal  lymphadenopathy. Reproductive: Prostate is enlarged measuring up 4.7 cm. Other: Diffuse mesenteric edema. Trace simple free fluid. No intraperitoneal free gas. No organized fluid collection. Musculoskeletal: Subcutaneus soft tissue edema. Small fluid containing right inguinal hernia. No suspicious lytic or blastic osseous lesions. No acute displaced fracture. IMPRESSION: 1. Small fluid containing right inguinal hernia. 2. Subcutaneus soft tissue edema and mesenteric edema. 3. No acute intra-abdominal or intrapelvic abnormality with limited evaluation on this noncontrast study. 4. Cardiomegaly. 5. Prostatomegaly. 6.  Aortic Atherosclerosis (ICD10-I70.0). Electronically Signed   By: Tish Frederickson M.D.   On: 11/16/2022 21:16   CT Head Wo Contrast  Result Date: 11/16/2022 CLINICAL DATA:  Altered mental status EXAM: CT HEAD WITHOUT CONTRAST TECHNIQUE: Contiguous axial images were obtained from the base of the skull through the vertex without intravenous contrast. RADIATION DOSE REDUCTION: This exam was performed according to the departmental dose-optimization program which includes automated exposure control, adjustment of the mA and/or kV according to patient size and/or use of iterative reconstruction technique. COMPARISON:  Brain MRI 05/08/2022 FINDINGS: Brain: There is no acute intracranial hemorrhage, extra-axial fluid collection, or acute infarct. There is mild parenchymal volume loss. The ventricles are stable in size. A remote cortical infarct in the left occipital lobe is unchanged (2-18). Additional patchy hypodensity in the supratentorial white matter likely reflects additional remote infarcts and background chronic small-vessel ischemic change. The pituitary and suprasellar region are normal. There is no mass lesion. There is no mass effect or midline shift. Vascular: There is calcification of the bilateral carotid siphons and vertebral arteries. Skull: Normal. Negative for fracture or focal lesion.  Sinuses/Orbits: The imaged paranasal sinuses are clear. The globes and orbits are unremarkable. Other: None. IMPRESSION: No acute intracranial pathology. Electronically Signed   By: Lesia Hausen M.D.   On: 11/16/2022 16:27   DG Chest Port 1 View  Result Date: 11/16/2022 CLINICAL DATA:  Altered mental status EXAM: PORTABLE CHEST 1 VIEW COMPARISON:  11/08/2022 x-ray and older. FINDINGS: Enlarged cardiopericardial silhouette. Tortuous and ectatic aorta. Basilar congestion is  increasing from previous. Possible component of edema as well. No pneumothorax or effusion. Overlapping cardiac leads. IMPRESSION: Enlarged cardiopericardial silhouette with increasing vascular congestion and trace edema Electronically Signed   By: Jill Side M.D.   On: 11/16/2022 15:11    EKG: Independently reviewed. See above   Assessment/Plan  UTI complicated  -patient with indwelling follow due to neurogenic  bladder -foley changed in ED  - continue on CTX -f/u with urine culture   Acute Metabolic encephalopathy  -due to acute medical illness/uti  -tx underlying cause -patient is more alert but not back to baseline   Mild acute on chronic  Diastolic heart failure  -b/l edema  -cxr with edema  -start lasix iv  -monitor renal function  - continue spironolactone    Chronic diarrhea History of C-dif colitis -patient no abdominal pain  -patient denies diarrhea  -monitor symptoms   CVA -residual cognitive deficits  - continue on secondary ppx    DMII -last A1c 6 - place on iss/fs  -resume lantus once med rec complete   Seizure disorder -no mention of seizure episode  -continue keppra    Anemia  -stable  -multifactorial  - monitor h/h  CKDIIIb -appear to be trending towards new baseline   HLD -continue statin    Depression/anxiety -resume home regimen as able   Neurogenic bladder with indwelling foley, DVT prophylaxis:scd Code Status: full/ as discussed per patient wishes in event of  cardiac arrest  Family Communication: none at bedside Disposition Plan: patient  expected to be admitted greater than 2 midnights  Consults called: cardiology DR Vickki Muff: epic message sent for am consult Admission status: progressive    Clance Boll MD [facility]   If 7PM-7AM, please contact night-coverage [email or web address] Password [facility]  11/16/2022, 10:34 PM

## 2022-11-16 NOTE — ED Triage Notes (Signed)
Patient comes from [place] home.  Patient with reported altered mental status.  Patient is normally able to talk.  He is less responsive, not eating much, decreased loc.  Patient cbg was 244.  He is responsive with one to two word responses.  He is alert to person but not to place and time.  He has indwelling foley cath in place with noted sedement.  He had loose bm noted on urinary cath holding device.  Patient rectal temp is 96.9

## 2022-11-17 ENCOUNTER — Inpatient Hospital Stay (HOSPITAL_COMMUNITY): Payer: Medicare Other

## 2022-11-17 ENCOUNTER — Inpatient Hospital Stay (HOSPITAL_COMMUNITY)
Admit: 2022-11-17 | Discharge: 2022-11-17 | Disposition: A | Discharge status: Home / Self Care | Payer: Medicare Other | Attending: Internal Medicine | Admitting: Internal Medicine

## 2022-11-17 ENCOUNTER — Encounter (HOSPITAL_COMMUNITY): Payer: Self-pay | Admitting: Internal Medicine

## 2022-11-17 DIAGNOSIS — R319 Hematuria, unspecified: Secondary | ICD-10-CM

## 2022-11-17 DIAGNOSIS — N390 Urinary tract infection, site not specified: Secondary | ICD-10-CM | POA: Diagnosis not present

## 2022-11-17 DIAGNOSIS — G9341 Metabolic encephalopathy: Secondary | ICD-10-CM | POA: Diagnosis not present

## 2022-11-17 DIAGNOSIS — I5031 Acute diastolic (congestive) heart failure: Secondary | ICD-10-CM | POA: Diagnosis not present

## 2022-11-17 DIAGNOSIS — R4182 Altered mental status, unspecified: Secondary | ICD-10-CM

## 2022-11-17 LAB — COMPREHENSIVE METABOLIC PANEL
ALT: 22 U/L (ref 0–44)
AST: 16 U/L (ref 15–41)
Albumin: 2 g/dL — ABNORMAL LOW (ref 3.5–5.0)
Alkaline Phosphatase: 74 U/L (ref 38–126)
Anion gap: 8 (ref 5–15)
BUN: 50 mg/dL — ABNORMAL HIGH (ref 8–23)
CO2: 22 mmol/L (ref 22–32)
Calcium: 7.7 mg/dL — ABNORMAL LOW (ref 8.9–10.3)
Chloride: 108 mmol/L (ref 98–111)
Creatinine, Ser: 3.14 mg/dL — ABNORMAL HIGH (ref 0.61–1.24)
GFR, Estimated: 21 mL/min — ABNORMAL LOW (ref >60)
Glucose, Bld: 199 mg/dL — ABNORMAL HIGH (ref 70–99)
Potassium: 4.1 mmol/L (ref 3.5–5.1)
Sodium: 138 mmol/L (ref 135–145)
Total Bilirubin: 0.2 mg/dL — ABNORMAL LOW (ref 0.3–1.2)
Total Protein: 5.3 g/dL — ABNORMAL LOW (ref 6.5–8.1)

## 2022-11-17 LAB — ECHOCARDIOGRAM LIMITED
Area-P 1/2: 4.39 cm2
Height: 66 in
P 1/2 time: 663 msec
S' Lateral: 3 cm
Weight: 2694.9 oz

## 2022-11-17 LAB — BLOOD GAS, VENOUS
Acid-Base Excess: 0.8 mmol/L (ref 0.0–2.0)
Bicarbonate: 26 mmol/L (ref 20.0–28.0)
O2 Saturation: 96.5 %
Patient temperature: 37
pCO2, Ven: 43 mmHg — ABNORMAL LOW (ref 44–60)
pH, Ven: 7.39 (ref 7.25–7.43)
pO2, Ven: 72 mmHg — ABNORMAL HIGH (ref 32–45)

## 2022-11-17 LAB — URINE CULTURE: Culture: NO GROWTH

## 2022-11-17 LAB — CBC
HCT: 21.5 % — ABNORMAL LOW (ref 39.0–52.0)
Hemoglobin: 6.7 g/dL — CL (ref 13.0–17.0)
MCH: 30 pg (ref 26.0–34.0)
MCHC: 31.2 g/dL (ref 30.0–36.0)
MCV: 96.4 fL (ref 80.0–100.0)
Platelets: 277 10*3/uL (ref 150–400)
RBC: 2.23 MIL/uL — ABNORMAL LOW (ref 4.22–5.81)
RDW: 15.3 % (ref 11.5–15.5)
WBC: 11.7 10*3/uL — ABNORMAL HIGH (ref 4.0–10.5)
nRBC: 0 % (ref 0.0–0.2)

## 2022-11-17 LAB — CBG MONITORING, ED
Glucose-Capillary: 184 mg/dL — ABNORMAL HIGH (ref 70–99)
Glucose-Capillary: 174 mg/dL — ABNORMAL HIGH (ref 70–99)

## 2022-11-17 LAB — ABO/RH: ABO/RH(D): O POS

## 2022-11-17 LAB — HIV ANTIBODY (ROUTINE TESTING W REFLEX): HIV Screen 4th Generation wRfx: NONREACTIVE

## 2022-11-17 LAB — GLUCOSE, CAPILLARY
Glucose-Capillary: 163 mg/dL — ABNORMAL HIGH (ref 70–99)
Glucose-Capillary: 181 mg/dL — ABNORMAL HIGH (ref 70–99)

## 2022-11-17 LAB — PREPARE RBC (CROSSMATCH)

## 2022-11-17 LAB — MRSA NEXT GEN BY PCR, NASAL: MRSA by PCR Next Gen: NOT DETECTED

## 2022-11-17 LAB — HEMOGLOBIN AND HEMATOCRIT, BLOOD
HCT: 24.8 % — ABNORMAL LOW (ref 39.0–52.0)
Hemoglobin: 7.9 g/dL — ABNORMAL LOW (ref 13.0–17.0)

## 2022-11-17 LAB — ETHANOL: Alcohol, Ethyl (B): <10 mg/dL (ref <10)

## 2022-11-17 MED ORDER — RISAQUAD PO CAPS
2.0000 | ORAL_CAPSULE | Freq: Every day | ORAL | Status: DC
  Administered 2022-11-17 – 2022-11-21 (×5): 2 via ORAL
  Filled 2022-11-17 (×5): qty 2

## 2022-11-17 MED ORDER — SODIUM CHLORIDE 0.9% IV SOLUTION: Freq: Once | INTRAVENOUS | Status: AC

## 2022-11-17 NOTE — Progress Notes (Signed)
  Echocardiogram 2D Echocardiogram has been performed.  Manuel Kramer 11/17/2022, 4:31 PM

## 2022-11-17 NOTE — Progress Notes (Addendum)
PROGRESS NOTE    Manuel Kramer  UUV:[id number] DOB: [birth year] DOA: 11/16/2022 PCP: Vonna Drafts, FNP    Brief Narrative:  Manuel Kramer is a 67 y.o. male with medical history significant of  Chronic diarrhea,HTN, history of C-dif colitis,  DMII, seizure disorder,CKDIIIa, HLD,depression/anxiety, grade 1 diastolic dysfunction ef 47-42%, neurogenic bladder with indwelling foley, CVA7/2023 who now resides at [facility], who has recent interim history of admission 1/10-1/15/2024 with discharge diagnosis of sepsis from unknown source. Patient present today from NH with chief complaint of change in mental status per staff.  Unclear baseline.   Assessment and Plan: ? UTI complicated  -patient with indwelling follow due to neurogenic  bladder -foley changed in ED  - continue on CTX -f/u with urine culture    Acute Metabolic encephalopathy  -due to acute medical illness/uti  -tx underlying cause -unclear baseline-- would not answer any questions for me   Mild acute on chronic  Diastolic heart failure  -b/l edema  -cxr with edema  -start lasix iv  -monitor renal function  -diuretics had been held at last d/c   Chronic diarrhea History of C-dif colitis -no abdominal pain  -monitor symptoms    CVA -residual cognitive deficits    DMII -last A1c 6 - place on iss/fs      Seizure disorder -no mention of seizure episode  Check EEG -continue keppra     Anemia  -h/h trending down -occult stool -transfuse 1 unit -? From volume dilution or if actually losing blood   CKDIIIb -appear to be trending towards new baseline of IV   HLD -continue statin    Depression/anxiety -resume home regimen as able    Neurogenic bladder with indwelling foley   DVT prophylaxis: SCDs Start: 11/16/22 2233    Code Status: Full Code Family Communication: none at bedside  Disposition Plan:  Level of care: Telemetry Status is: Inpatient Remains inpatient appropriate because:  needs further work up    Consultants:  none   Subjective: Eyes closed, not answering questions  Objective: Vitals:   11/17/22 0400 11/17/22 0531 11/17/22 0830 11/17/22 0926  BP: 134/66 (!) 145/68 (!) 163/72   Pulse: 63 65 65   Resp: 11 12 12    Temp:  97.8 F (36.6 C)  98 F (36.7 C)  TempSrc:  Oral  Oral  SpO2: 99% 96% 95%    No intake or output data in the 24 hours ending 11/17/22 0958 There were no vitals filed for this visit.  Examination:   General: Appearance:     Overweight male in no acute distress     Lungs:     respirations unlabored  Heart:    Normal heart rate.    MS:   All extremities are intact.    Neurologic:   Will awaken but does not answer questions       Data Reviewed: I have personally reviewed following labs and imaging studies  CBC: Recent Labs  Lab 11/11/22 0949 11/12/22 0556 11/13/22 0620 11/16/22 1459  WBC 17.3* 11.0* 7.8 12.9*  NEUTROABS 14.8*  --   --  9.9*  HGB 7.0* 7.1* 7.5* 7.4*  HCT 21.6* 22.7* 23.5* 23.5*  MCV 93.1 96.6 95.9 96.3  PLT 120* 177 207 595   Basic Metabolic Panel: Recent Labs  Lab 11/10/22 1136 11/12/22 0556 11/13/22 0620 11/16/22 1459 11/17/22 0032  NA 138 136 136 138 138  K 3.8 3.5 3.3* 4.1 4.1  CL 105 101 102 105 108  CO2 22 24 25 25 22   GLUCOSE 286* 190* 49* 225* 199*  BUN 59* 59* 57* 55* 50*  CREATININE 4.03* 3.77* 3.36* 3.13* 3.14*  CALCIUM 7.7* 7.9* 7.9* 7.7* 7.7*  MG  --  1.9 2.2  --   --    GFR: Estimated Creatinine Clearance: 21.2 mL/min (A) (by C-G formula based on SCr of 3.14 mg/dL (H)). Liver Function Tests: Recent Labs  Lab 11/16/22 1459 11/17/22 0032  AST 16 16  ALT 26 22  ALKPHOS 83 74  BILITOT 0.5 0.2*  PROT 5.9* 5.3*  ALBUMIN 2.3* 2.0*   No results for input(s): "LIPASE", "AMYLASE" in the last 168 hours. Recent Labs  Lab 11/16/22 1500  AMMONIA 14   Coagulation Profile: No results for input(s): "INR", "PROTIME" in the last 168 hours. Cardiac Enzymes: No results  for input(s): "CKTOTAL", "CKMB", "CKMBINDEX", "TROPONINI" in the last 168 hours. BNP (last 3 results) No results for input(s): "PROBNP" in the last 8760 hours. HbA1C: No results for input(s): "HGBA1C" in the last 72 hours. CBG: Recent Labs  Lab 11/12/22 2218 11/13/22 0729 11/13/22 0821 11/13/22 1201 11/17/22 0925  GLUCAP 125* 49* 89 89 184*   Lipid Profile: No results for input(s): "CHOL", "HDL", "LDLCALC", "TRIG", "CHOLHDL", "LDLDIRECT" in the last 72 hours. Thyroid Function Tests: Recent Labs    11/16/22 1459 11/16/22 1530  TSH  --  2.991  FREET4 1.15*  --    Anemia Panel: No results for input(s): "VITAMINB12", "FOLATE", "FERRITIN", "TIBC", "IRON", "RETICCTPCT" in the last 72 hours. Sepsis Labs: Recent Labs  Lab 11/16/22 1459  LATICACIDVEN 0.7    Recent Results (from the past 240 hour(s))  Resp panel by RT-PCR (RSV, Flu A&B, Covid) Anterior Nasal Swab     Status: None   Collection Time: 11/08/22  9:57 AM   Specimen: Anterior Nasal Swab  Result Value Ref Range Status   SARS Coronavirus 2 by RT PCR NEGATIVE NEGATIVE Final    Comment: (NOTE) SARS-CoV-2 target nucleic acids are NOT DETECTED.  The SARS-CoV-2 RNA is generally detectable in upper respiratory specimens during the acute phase of infection. The lowest concentration of SARS-CoV-2 viral copies this assay can detect is 138 copies/mL. A negative result does not preclude SARS-Cov-2 infection and should not be used as the sole basis for treatment or other patient management decisions. A negative result may occur with  improper specimen collection/handling, submission of specimen other than nasopharyngeal swab, presence of viral mutation(s) within the areas targeted by this assay, and inadequate number of viral copies(<138 copies/mL). A negative result must be combined with clinical observations, patient history, and epidemiological information. The expected result is Negative.  Fact Sheet for Patients:   BloggerCourse.comhttps://www.fda.gov/media/152166/download  Fact Sheet for Healthcare Providers:  SeriousBroker.ithttps://www.fda.gov/media/152162/download  This test is no t yet approved or cleared by the [place] FDA and  has been authorized for detection and/or diagnosis of SARS-CoV-2 by FDA under an Emergency Use Authorization (EUA). This EUA will remain  in effect (meaning this test can be used) for the duration of the COVID-19 declaration under Section 564(b)(1) of the Act, 21 U.S.C.section 360bbb-3(b)(1), unless the authorization is terminated  or revoked sooner.       Influenza A by PCR NEGATIVE NEGATIVE Final   Influenza B by PCR NEGATIVE NEGATIVE Final    Comment: (NOTE) The Xpert Xpress SARS-CoV-2/FLU/RSV plus assay is intended as an aid in the diagnosis of influenza from Nasopharyngeal swab specimens and should not be used as a sole basis for treatment.  Nasal washings and aspirates are unacceptable for Xpert Xpress SARS-CoV-2/FLU/RSV testing.  Fact Sheet for Patients: EntrepreneurPulse.com.au  Fact Sheet for Healthcare Providers: IncredibleEmployment.be  This test is not yet approved or cleared by the [place] FDA and has been authorized for detection and/or diagnosis of SARS-CoV-2 by FDA under an Emergency Use Authorization (EUA). This EUA will remain in effect (meaning this test can be used) for the duration of the COVID-19 declaration under Section 564(b)(1) of the Act, 21 U.S.C. section 360bbb-3(b)(1), unless the authorization is terminated or revoked.     Resp Syncytial Virus by PCR NEGATIVE NEGATIVE Final    Comment: (NOTE) Fact Sheet for Patients: EntrepreneurPulse.com.au  Fact Sheet for Healthcare Providers: IncredibleEmployment.be  This test is not yet approved or cleared by the [place] FDA and has been authorized for detection and/or diagnosis of SARS-CoV-2 by FDA under an Emergency Use  Authorization (EUA). This EUA will remain in effect (meaning this test can be used) for the duration of the COVID-19 declaration under Section 564(b)(1) of the Act, 21 U.S.C. section 360bbb-3(b)(1), unless the authorization is terminated or revoked.  Performed at [facility], [address], [place], [place] [address]   Blood Culture (routine x 2)     Status: Abnormal   Collection Time: 11/08/22 10:30 AM   Specimen: BLOOD  Result Value Ref Range Status   Specimen Description   Final    BLOOD SITE NOT SPECIFIED Performed at [place] [address], [place], [place] [address]    Special Requests   Final    BOTTLES DRAWN AEROBIC AND ANAEROBIC Blood Culture results may not be optimal due to an inadequate volume of blood received in culture bottles Performed at [place] [address], [place], [place] [address]    Culture  Setup Time   Final    GRAM POSITIVE COCCI IN BOTH AEROBIC AND ANAEROBIC BOTTLES CRITICAL VALUE NOTED.  VALUE IS CONSISTENT WITH PREVIOUSLY REPORTED AND CALLED VALUE. Performed at [facility], [address] [place], [place] [address]    Culture (A)  Final    STAPHYLOCOCCUS EPIDERMIDIS THE SIGNIFICANCE OF ISOLATING THIS ORGANISM FROM A SINGLE SET OF BLOOD CULTURES WHEN MULTIPLE SETS ARE DRAWN IS UNCERTAIN. PLEASE NOTIFY THE MICROBIOLOGY DEPARTMENT WITHIN ONE WEEK IF SPECIATION AND SENSITIVITIES ARE REQUIRED. STAPHYLOCOCCUS HOMINIS    Report Status 11/12/2022 FINAL  Final   Organism ID, Bacteria STAPHYLOCOCCUS HOMINIS  Final      Susceptibility   Staphylococcus hominis - MIC*    CIPROFLOXACIN >=8 RESISTANT Resistant     ERYTHROMYCIN RESISTANT Resistant     GENTAMICIN <=0.5 SENSITIVE Sensitive     OXACILLIN >=4 RESISTANT Resistant     TETRACYCLINE >=16 RESISTANT Resistant     VANCOMYCIN <=0.5 SENSITIVE Sensitive     TRIMETH/SULFA <=10 SENSITIVE Sensitive     CLINDAMYCIN RESISTANT  Resistant     RIFAMPIN <=0.5 SENSITIVE Sensitive     Inducible Clindamycin POSITIVE Resistant     * STAPHYLOCOCCUS HOMINIS  Blood Culture (routine x 2)     Status: Abnormal   Collection Time: 11/08/22 10:39 AM   Specimen: BLOOD  Result Value Ref Range Status   Specimen Description   Final    BLOOD SITE NOT SPECIFIED Performed at [facility] [address], [place], [place] [address]    Special Requests   Final    BOTTLES DRAWN AEROBIC ONLY Blood Culture adequate volume Performed at [facility],  [address] [place], [place] [address]    Culture  Setup Time   Final    GRAM POSITIVE COCCI AEROBIC BOTTLE ONLY CRITICAL RESULT CALLED TO, READ BACK BY AND VERIFIED WITH: PHARMD T.GREEN AT 6045 ON 11/09/2022 BY T.SAAD.    Culture (A)  Final    STAPHYLOCOCCUS HOMINIS SUSCEPTIBILITIES PERFORMED ON PREVIOUS CULTURE WITHIN THE LAST 5 DAYS. Performed at [facility], [address] [place], [place] [address]    Report Status 11/12/2022 FINAL  Final  Blood Culture ID Panel (Reflexed)     Status: Abnormal   Collection Time: 11/08/22 10:39 AM  Result Value Ref Range Status   Enterococcus faecalis NOT DETECTED NOT DETECTED Final   Enterococcus Faecium NOT DETECTED NOT DETECTED Final   Listeria monocytogenes NOT DETECTED NOT DETECTED Final   Staphylococcus species DETECTED (A) NOT DETECTED Final    Comment: CRITICAL RESULT CALLED TO, READ BACK BY AND VERIFIED WITH: PHARMD T.GREEN AT 1914 ON 11/09/2022 BY T.SAAD.    Staphylococcus aureus (BCID) NOT DETECTED NOT DETECTED Final   Staphylococcus epidermidis DETECTED (A) NOT DETECTED Final    Comment: Methicillin (oxacillin) resistant coagulase negative staphylococcus. Possible blood culture contaminant (unless isolated from more than one blood culture draw or clinical case suggests pathogenicity). No antibiotic treatment is indicated for blood  culture contaminants. CRITICAL RESULT CALLED TO, READ  BACK BY AND VERIFIED WITH: PHARMD T.GREEN AT 7829 ON 11/09/2022 BY T.SAAD.    Staphylococcus lugdunensis NOT DETECTED NOT DETECTED Final   Streptococcus species NOT DETECTED NOT DETECTED Final   Streptococcus agalactiae NOT DETECTED NOT DETECTED Final   Streptococcus pneumoniae NOT DETECTED NOT DETECTED Final   Streptococcus pyogenes NOT DETECTED NOT DETECTED Final   A.calcoaceticus-baumannii NOT DETECTED NOT DETECTED Final   Bacteroides fragilis NOT DETECTED NOT DETECTED Final   Enterobacterales NOT DETECTED NOT DETECTED Final   Enterobacter cloacae complex NOT DETECTED NOT DETECTED Final   Escherichia coli NOT DETECTED NOT DETECTED Final   Klebsiella aerogenes NOT DETECTED NOT DETECTED Final   Klebsiella oxytoca NOT DETECTED NOT DETECTED Final   Klebsiella pneumoniae NOT DETECTED NOT DETECTED Final   Proteus species NOT DETECTED NOT DETECTED Final   Salmonella species NOT DETECTED NOT DETECTED Final   Serratia marcescens NOT DETECTED NOT DETECTED Final   Haemophilus influenzae NOT DETECTED NOT DETECTED Final   Neisseria meningitidis NOT DETECTED NOT DETECTED Final   Pseudomonas aeruginosa NOT DETECTED NOT DETECTED Final   Stenotrophomonas maltophilia NOT DETECTED NOT DETECTED Final   Candida albicans NOT DETECTED NOT DETECTED Final   Candida auris NOT DETECTED NOT DETECTED Final   Candida glabrata NOT DETECTED NOT DETECTED Final   Candida krusei NOT DETECTED NOT DETECTED Final   Candida parapsilosis NOT DETECTED NOT DETECTED Final   Candida tropicalis NOT DETECTED NOT DETECTED Final   Cryptococcus neoformans/gattii NOT DETECTED NOT DETECTED Final   Methicillin resistance mecA/C DETECTED (A) NOT DETECTED Final    Comment: CRITICAL RESULT CALLED TO, READ BACK BY AND VERIFIED WITH: PHARMD T.GREEN AT 5621 ON 11/09/2022 BY T.SAAD. Performed at [facility], [address], [place], [place] [address]   Urine Culture     Status: None   Collection Time: 11/08/22  3:00 PM    Specimen: In/Out Cath Urine  Result Value Ref Range Status   Specimen Description   Final    IN/OUT CATH URINE Performed at [facility], [address] [place], [place] [address]    Special Requests  Final    NONE Performed at [facility], [address], [place], [place] [address]    Culture   Final    NO GROWTH Performed at [facility], [address] [place], [place] [address]    Report Status 11/09/2022 FINAL  Final  Culture, blood (Routine X 2) w Reflex to ID Panel     Status: None   Collection Time: 11/10/22 12:55 PM   Specimen: BLOOD  Result Value Ref Range Status   Specimen Description   Final    BLOOD BLOOD RIGHT ARM Performed at [facility], [address] [place], [place] [address]    Special Requests   Final    BOTTLES DRAWN AEROBIC AND ANAEROBIC Blood Culture adequate volume Performed at [facility], [address], [place], [place] [address]    Culture   Final    NO GROWTH 5 DAYS Performed at [facility], [address] [place], [place] [address]    Report Status 11/15/2022 FINAL  Final  Culture, blood (Routine X 2) w Reflex to ID Panel     Status: None   Collection Time: 11/10/22  1:00 PM   Specimen: BLOOD  Result Value Ref Range Status   Specimen Description   Final    BLOOD BLOOD LEFT ARM Performed at [facility], [address], [place], [place] [address]    Special Requests   Final    BOTTLES DRAWN AEROBIC AND ANAEROBIC Blood Culture adequate volume Performed at [facility], [address], [place], [place] [address]    Culture   Final    NO GROWTH 5 DAYS Performed at [facility], [address], [place], [place] [address]    Report Status 11/15/2022 FINAL  Final  Blood culture (routine x 2)     Status: None (Preliminary result)   Collection Time: 11/16/22  2:51 PM   Specimen: BLOOD RIGHT HAND  Result  Value Ref Range Status   Specimen Description   Final    BLOOD RIGHT HAND Performed at [facility], [address] [place], [place] [address]    Special Requests   Final    BOTTLES DRAWN AEROBIC AND ANAEROBIC Blood Culture adequate volume Performed at [facility], [address] [place], [place] [address]    Culture   Final    NO GROWTH < 12 HOURS Performed at [facility], [address], [place], [place] [address]    Report Status PENDING  Incomplete  Blood culture (routine x 2)     Status: None (Preliminary result)   Collection Time: 11/16/22  6:00 PM   Specimen: BLOOD  Result Value Ref Range Status   Specimen Description   Final    BLOOD BLOOD LEFT HAND Performed at [facility], [address], [place], [place] [address]    Special Requests   Final    BOTTLES DRAWN AEROBIC AND ANAEROBIC Blood Culture adequate volume Performed at [facility], [address] [place], [place] [address]    Culture   Final    NO GROWTH < 12 HOURS Performed at [facility], [address], [place], [place] [address]    Report Status PENDING  Incomplete  Resp panel by RT-PCR (RSV, Flu A&B, Covid) Anterior Nasal Swab     Status: None   Collection Time: 11/16/22  7:01 PM   Specimen:  Anterior Nasal Swab  Result Value Ref Range Status   SARS Coronavirus 2 by RT PCR NEGATIVE NEGATIVE Final    Comment: (NOTE) SARS-CoV-2 target nucleic acids are NOT DETECTED.  The SARS-CoV-2 RNA is generally detectable in upper respiratory specimens during the acute phase of infection. The lowest concentration of SARS-CoV-2 viral copies this assay can detect is 138 copies/mL. A negative result does not preclude SARS-Cov-2 infection and should not be used as the sole basis for treatment or other patient management decisions. A negative result may occur with  improper specimen collection/handling, submission of specimen  other than nasopharyngeal swab, presence of viral mutation(s) within the areas targeted by this assay, and inadequate number of viral copies(<138 copies/mL). A negative result must be combined with clinical observations, patient history, and epidemiological information. The expected result is Negative.  Fact Sheet for Patients:  BloggerCourse.comhttps://www.fda.gov/media/152166/download  Fact Sheet for Healthcare Providers:  SeriousBroker.ithttps://www.fda.gov/media/152162/download  This test is no t yet approved or cleared by the [place] FDA and  has been authorized for detection and/or diagnosis of SARS-CoV-2 by FDA under an Emergency Use Authorization (EUA). This EUA will remain  in effect (meaning this test can be used) for the duration of the COVID-19 declaration under Section 564(b)(1) of the Act, 21 U.S.C.section 360bbb-3(b)(1), unless the authorization is terminated  or revoked sooner.       Influenza A by PCR NEGATIVE NEGATIVE Final   Influenza B by PCR NEGATIVE NEGATIVE Final    Comment: (NOTE) The Xpert Xpress SARS-CoV-2/FLU/RSV plus assay is intended as an aid in the diagnosis of influenza from Nasopharyngeal swab specimens and should not be used as a sole basis for treatment. Nasal washings and aspirates are unacceptable for Xpert Xpress SARS-CoV-2/FLU/RSV testing.  Fact Sheet for Patients: BloggerCourse.comhttps://www.fda.gov/media/152166/download  Fact Sheet for Healthcare Providers: SeriousBroker.ithttps://www.fda.gov/media/152162/download  This test is not yet approved or cleared by the [place] FDA and has been authorized for detection and/or diagnosis of SARS-CoV-2 by FDA under an Emergency Use Authorization (EUA). This EUA will remain in effect (meaning this test can be used) for the duration of the COVID-19 declaration under Section 564(b)(1) of the Act, 21 U.S.C. section 360bbb-3(b)(1), unless the authorization is terminated or revoked.     Resp Syncytial Virus by PCR NEGATIVE NEGATIVE Final     Comment: (NOTE) Fact Sheet for Patients: BloggerCourse.comhttps://www.fda.gov/media/152166/download  Fact Sheet for Healthcare Providers: SeriousBroker.ithttps://www.fda.gov/media/152162/download  This test is not yet approved or cleared by the [place] FDA and has been authorized for detection and/or diagnosis of SARS-CoV-2 by FDA under an Emergency Use Authorization (EUA). This EUA will remain in effect (meaning this test can be used) for the duration of the COVID-19 declaration under Section 564(b)(1) of the Act, 21 U.S.C. section 360bbb-3(b)(1), unless the authorization is terminated or revoked.  Performed at [facility], [address] [place], [place] [id number]          Radiology Studies: CT Renal Stone Study  Result Date: 11/16/2022 CLINICAL DATA:  Abdominal/flank pain, stone suspected EXAM: CT ABDOMEN AND PELVIS WITHOUT CONTRAST TECHNIQUE: Multidetector CT imaging of the abdomen and pelvis was performed following the standard protocol without IV contrast. RADIATION DOSE REDUCTION: This exam was performed according to the departmental dose-optimization program which includes automated exposure control, adjustment of the mA and/or kV according to patient size and/or use of iterative reconstruction technique. COMPARISON:  CT abdomen pelvis 02/21/2022 FINDINGS: Lower chest: Cardiomegaly. Possible bilateral trace pleural effusions. Bilateral lobe subsegmental atelectasis. Hepatobiliary: No focal liver abnormality.  Contracted gallbladder. No gallstones, gallbladder wall thickening, or pericholecystic fluid. No biliary dilatation. Pancreas: No focal lesion. Normal pancreatic contour. No surrounding inflammatory changes. No main pancreatic ductal dilatation. Spleen: Nonspecific chronic calcification. Normal in size without focal abnormality. Adrenals/Urinary Tract: No adrenal nodule bilaterally. No nephrolithiasis and no hydronephrosis. Calcifications associated with the kidneys likely vascular.  No ureterolithiasis or hydroureter. The urinary bladder is unremarkable. Stomach/Bowel: Stomach is within normal limits. No evidence of bowel wall thickening or dilatation. Appendix appears normal. Vascular/Lymphatic: No abdominal aorta or iliac aneurysm. Mild atherosclerotic plaque of the aorta and its branches. No abdominal, pelvic, or inguinal lymphadenopathy. Reproductive: Prostate is enlarged measuring up 4.7 cm. Other: Diffuse mesenteric edema. Trace simple free fluid. No intraperitoneal free gas. No organized fluid collection. Musculoskeletal: Subcutaneus soft tissue edema. Small fluid containing right inguinal hernia. No suspicious lytic or blastic osseous lesions. No acute displaced fracture. IMPRESSION: 1. Small fluid containing right inguinal hernia. 2. Subcutaneus soft tissue edema and mesenteric edema. 3. No acute intra-abdominal or intrapelvic abnormality with limited evaluation on this noncontrast study. 4. Cardiomegaly. 5. Prostatomegaly. 6.  Aortic Atherosclerosis (ICD10-I70.0). Electronically Signed   By: Tish Frederickson M.D.   On: 11/16/2022 21:16   CT Head Wo Contrast  Result Date: 11/16/2022 CLINICAL DATA:  Altered mental status EXAM: CT HEAD WITHOUT CONTRAST TECHNIQUE: Contiguous axial images were obtained from the base of the skull through the vertex without intravenous contrast. RADIATION DOSE REDUCTION: This exam was performed according to the departmental dose-optimization program which includes automated exposure control, adjustment of the mA and/or kV according to patient size and/or use of iterative reconstruction technique. COMPARISON:  Brain MRI 05/08/2022 FINDINGS: Brain: There is no acute intracranial hemorrhage, extra-axial fluid collection, or acute infarct. There is mild parenchymal volume loss. The ventricles are stable in size. A remote cortical infarct in the left occipital lobe is unchanged (2-18). Additional patchy hypodensity in the supratentorial white matter likely  reflects additional remote infarcts and background chronic small-vessel ischemic change. The pituitary and suprasellar region are normal. There is no mass lesion. There is no mass effect or midline shift. Vascular: There is calcification of the bilateral carotid siphons and vertebral arteries. Skull: Normal. Negative for fracture or focal lesion. Sinuses/Orbits: The imaged paranasal sinuses are clear. The globes and orbits are unremarkable. Other: None. IMPRESSION: No acute intracranial pathology. Electronically Signed   By: Lesia Hausen M.D.   On: 11/16/2022 16:27   DG Chest Port 1 View  Result Date: 11/16/2022 CLINICAL DATA:  Altered mental status EXAM: PORTABLE CHEST 1 VIEW COMPARISON:  11/08/2022 x-ray and older. FINDINGS: Enlarged cardiopericardial silhouette. Tortuous and ectatic aorta. Basilar congestion is increasing from previous. Possible component of edema as well. No pneumothorax or effusion. Overlapping cardiac leads. IMPRESSION: Enlarged cardiopericardial silhouette with increasing vascular congestion and trace edema Electronically Signed   By: Karen Kays M.D.   On: 11/16/2022 15:11        Scheduled Meds:  aspirin EC  81 mg Oral Daily   carvedilol  25 mg Oral BID WC   cloNIDine  0.3 mg Oral TID   furosemide  80 mg Intravenous BID   gabapentin  100 mg Oral QHS   hydrALAZINE  100 mg Oral Q8H   insulin aspart  0-6 Units Subcutaneous TID WC   isosorbide dinitrate  40 mg Oral TID   levETIRAcetam  500 mg Oral BID   sodium bicarbonate  1,300 mg Oral BID   spironolactone  25 mg Oral Daily   Continuous  Infusions:  cefTRIAXone (ROCEPHIN)  IV       LOS: 1 day    Time spent: 45 minutes spent on chart review, discussion with nursing staff, consultants, updating family and interview/physical exam; more than 50% of that time was spent in counseling and/or coordination of care.    Joseph Art, DO [facility] Available via Epic secure chat 7am-7pm After these hours,  please refer to coverage provider listed on [email or web address] 11/17/2022, 9:58 AM

## 2022-11-17 NOTE — Procedures (Signed)
Patient Name: Manuel Kramer  MRN: [id number]  Epilepsy Attending: Lora Havens  Referring Physician/Provider: Geradine Girt, DO  Date: 11/17/2022 Duration: 24.05 mins  Patient history: 67yo M with ams. EEG to evaluate for seizure  Level of alertness: Awake, asleep  AEDs during EEG study: LEV, GBP  Technical aspects: This EEG study was done with scalp electrodes positioned according to the 10-20 International system of electrode placement. Electrical activity was reviewed with band pass filter of 1-70Hz , sensitivity of 7 uV/mm, display speed of 51mm/sec with a 60Hz  notched filter applied as appropriate. EEG data were recorded continuously and digitally stored.  Video monitoring was available and reviewed as appropriate.  Description: No clear posterior dominant rhythm was seen. Sleep was characterized by vertex waves, sleep spindles (12 to 14 Hz), maximal frontocentral region. EEG showed continuous generalized 5 to 6 Hz theta slowing. Hyperventilation and photic stimulation were not performed.     ABNORMALITY - Continuous slow, generalized  IMPRESSION: This study is suggestive of moderate diffuse encephalopathy, nonspecific etiology. No seizures or epileptiform discharges were seen throughout the recording.  Brycelyn Gambino Barbra Sarks

## 2022-11-17 NOTE — Progress Notes (Signed)
Pt is otf.

## 2022-11-17 NOTE — Progress Notes (Signed)
EEG complete - results pending 

## 2022-11-17 NOTE — Progress Notes (Signed)
Will attempt at different time pt is otf.

## 2022-11-18 DIAGNOSIS — G9341 Metabolic encephalopathy: Secondary | ICD-10-CM | POA: Diagnosis not present

## 2022-11-18 LAB — PROCALCITONIN: Procalcitonin: 2.08 ng/mL

## 2022-11-18 LAB — BASIC METABOLIC PANEL
Anion gap: 8 (ref 5–15)
BUN: 48 mg/dL — ABNORMAL HIGH (ref 8–23)
CO2: 25 mmol/L (ref 22–32)
Calcium: 8.2 mg/dL — ABNORMAL LOW (ref 8.9–10.3)
Chloride: 107 mmol/L (ref 98–111)
Creatinine, Ser: 2.76 mg/dL — ABNORMAL HIGH (ref 0.61–1.24)
GFR, Estimated: 25 mL/min — ABNORMAL LOW (ref >60)
Glucose, Bld: 193 mg/dL — ABNORMAL HIGH (ref 70–99)
Potassium: 4.2 mmol/L (ref 3.5–5.1)
Sodium: 140 mmol/L (ref 135–145)

## 2022-11-18 LAB — IRON AND TIBC
Iron: 26 ug/dL — ABNORMAL LOW (ref 45–182)
Saturation Ratios: 14 % — ABNORMAL LOW (ref 17.9–39.5)
TIBC: 183 ug/dL — ABNORMAL LOW (ref 250–450)
UIBC: 157 ug/dL

## 2022-11-18 LAB — CBC
HCT: 24.9 % — ABNORMAL LOW (ref 39.0–52.0)
Hemoglobin: 7.7 g/dL — ABNORMAL LOW (ref 13.0–17.0)
MCH: 29.8 pg (ref 26.0–34.0)
MCHC: 30.9 g/dL (ref 30.0–36.0)
MCV: 96.5 fL (ref 80.0–100.0)
Platelets: 286 10*3/uL (ref 150–400)
RBC: 2.58 MIL/uL — ABNORMAL LOW (ref 4.22–5.81)
RDW: 16 % — ABNORMAL HIGH (ref 11.5–15.5)
WBC: 13 10*3/uL — ABNORMAL HIGH (ref 4.0–10.5)
nRBC: 0 % (ref 0.0–0.2)

## 2022-11-18 LAB — VITAMIN B12: Vitamin B-12: 1484 pg/mL — ABNORMAL HIGH (ref 180–914)

## 2022-11-18 LAB — C-REACTIVE PROTEIN: CRP: 2 mg/dL — ABNORMAL HIGH (ref <1.0)

## 2022-11-18 LAB — GLUCOSE, CAPILLARY
Glucose-Capillary: 182 mg/dL — ABNORMAL HIGH (ref 70–99)
Glucose-Capillary: 175 mg/dL — ABNORMAL HIGH (ref 70–99)
Glucose-Capillary: 208 mg/dL — ABNORMAL HIGH (ref 70–99)
Glucose-Capillary: 201 mg/dL — ABNORMAL HIGH (ref 70–99)

## 2022-11-18 LAB — T3: T3, Total: 63 ng/dL — ABNORMAL LOW (ref 71–180)

## 2022-11-18 MED ORDER — CHLORHEXIDINE GLUCONATE CLOTH 2 % EX PADS
6.0000 | MEDICATED_PAD | Freq: Every day | CUTANEOUS | Status: DC
  Administered 2022-11-18 – 2022-11-21 (×4): 6 via TOPICAL

## 2022-11-18 MED ORDER — CLOPIDOGREL BISULFATE 75 MG PO TABS
75.0000 mg | ORAL_TABLET | Freq: Every day | ORAL | Status: DC
  Administered 2022-11-18 – 2022-11-21 (×4): 75 mg via ORAL
  Filled 2022-11-18 (×4): qty 1

## 2022-11-18 MED ORDER — SPIRONOLACTONE 25 MG PO TABS: 25.0000 mg | ORAL_TABLET | Freq: Every day | ORAL | Status: DC

## 2022-11-18 MED ORDER — ATORVASTATIN CALCIUM 40 MG PO TABS
40.0000 mg | ORAL_TABLET | Freq: Every day | ORAL | Status: DC
  Administered 2022-11-18 – 2022-11-21 (×4): 40 mg via ORAL
  Filled 2022-11-18 (×4): qty 1

## 2022-11-18 MED ORDER — HYDRALAZINE HCL 20 MG/ML IJ SOLN
10.0000 mg | Freq: Once | INTRAMUSCULAR | Status: DC
  Filled 2022-11-18 (×2): qty 1

## 2022-11-18 MED ORDER — FUROSEMIDE 10 MG/ML IJ SOLN
80.0000 mg | Freq: Two times a day (BID) | INTRAMUSCULAR | Status: DC
  Administered 2022-11-18 – 2022-11-19 (×2): 80 mg via INTRAVENOUS
  Filled 2022-11-18 (×2): qty 8

## 2022-11-18 MED ORDER — FUROSEMIDE 10 MG/ML IJ SOLN: 40.0000 mg | Freq: Once | INTRAMUSCULAR | Status: DC

## 2022-11-18 MED ORDER — FOOD THICKENER (SIMPLYTHICK): 1.0000 | ORAL | Status: DC | PRN

## 2022-11-18 NOTE — Progress Notes (Signed)
[name] Patient: Manuel Kramer ZDG:[id number] DOB: Aug 19, 1956 DOA: 11/16/2022  DOS: the patient was seen and examined on 11/18/2022  Brief hospital course: PMH of type II DM, seizures, CKD 3A, HTN, chronic diarrhea and history of C. difficile, HLD, depression, neurogenic bladder with chronic Foley, CVA with residual deficit.  Currently present to the hospital with complaints of confusion.  Appears to have volume overloaded and receiving IV diuresis now. Assessment and Plan: Acute metabolic encephalopathy. Mentation appears to be improving significantly. No new focal deficit. Continue close monitoring. Minimize psychotropic medications. EEG negative.  No CT scan abnormality.  Possible UTI. Less likely. Will discontinue antibiotic. Urine culture negative. Foley catheter changed in the ED.  Acute on chronic diastolic CHF. Will treat with IV diuresis and monitor renal function.  Chronic diarrhea. History of C. difficile. Monitor for now.  History of CVA with residual deficit. Speech therapy and PT OT consulted.  Esophageal dysmotility. Will check x-ray esophagus.  Type 2 diabetes mellitus, controlled with PVD Continue sliding scale insulin.  History of seizure disorder. EEG negative. Continue Keppra. Hold gabapentin.  Anemia. Associated with chronic kidney disease. Received 1 PRBC transfusion. No active bleeding seen. Monitor.  CKD 3B. Renal function improving after diuresis. Monitor.  HLD. Continue statin.  Depression. Holding home medication in the setting of encephalopathy.  Neurogenic bladder. Currently has chronic Foley catheter.  Continue.   Subjective: No nausea no vomiting.  Denies any acute complaint.  Physical Exam: General: in Mild distress, No Rash Cardiovascular: S1 and S2 Present, No Murmur Respiratory: Good respiratory effort, Bilateral Air entry present. No Crackles, No wheezes Abdomen: Bowel Sound present, No  tenderness Extremities: Generalized edema Neuro: Lethargic and oriented x3, no new focal deficit  Data Reviewed: I have Reviewed nursing notes, Vitals, and Lab results. Since last encounter, pertinent lab results CBC and BMP   . I have ordered test including CBC and BMP  . I have discussed pt's care plan and test results with speech therapy  .   Disposition: Status is: Inpatient Remains inpatient appropriate because: Need for diuresis  SCDs Start: 11/16/22 2233   Family Communication: No one at bedside Level of care: Telemetry Continue telemetry for CHF Vitals:   11/18/22 0628 11/18/22 1029 11/18/22 1429 11/18/22 1659  BP: (!) 173/72 (!) 170/77 (!) 169/78 (!) 192/92  Pulse:  69 67 63  Resp:  18 18   Temp:      TempSrc:      SpO2:  99% 97%   Weight:      Height:         Author: Berle Mull, MD 11/18/2022 6:59 PM  Please look on [email or web address] to find out who is on call.

## 2022-11-18 NOTE — Hospital Course (Addendum)
PMH of type II DM, seizures, CKD 3A, HTN, chronic diarrhea and history of C. difficile, HLD, depression, neurogenic bladder with chronic Foley, CVA with residual deficit.  Currently present to the hospital with complaints of confusion.  Appears to have volume overloaded and treated with IV diuresis.

## 2022-11-18 NOTE — Plan of Care (Signed)
  Problem: Clinical Measurements: Goal: Diagnostic test results will improve Outcome: Progressing Goal: Signs and symptoms of infection will decrease Outcome: Progressing   Problem: Respiratory: Goal: Ability to maintain adequate ventilation will improve Outcome: Progressing   

## 2022-11-18 NOTE — Evaluation (Signed)
Clinical/Bedside Swallow Evaluation Patient Details  Name: Manuel Kramer MRN: [id number] Date of Birth: [birth year]  Today's Date: 11/18/2022 Time: SLP Start Time (ACUTE ONLY): 1555 SLP Stop Time (ACUTE ONLY): 1630 SLP Time Calculation (min) (ACUTE ONLY): 35 min  Past Medical History:  Past Medical History:  Diagnosis Date   Chronic diarrhea    Diabetes mellitus without complication ([place])    Hypertension    Past Surgical History:  Past Surgical History:  Procedure Laterality Date   COLOSCOPY      POLYP   HPI:  Per admitting MD note, Manuel Kramer is a 67 y.o. male with medical history significant of   Chronic diarrhea,HTN, history of C-dif colitis,  DMII, seizure disorder,CKDIIIa, HLD,depression/anxiety, grade 1 diastolic dysfunction ef 42-70%, neurogenic bladder with indwelling foley, CVA7/2023 who now resides at [facility], who has recent interim history of admission 1/10-1/15/2024 with discharge diagnosis of sepsis from unknown source. Patient present today from NH with chief complaint of change in mental status per staff.  Unclear baseline.  Prior Chest imaging showed "Slightly patulous esophagus with some debris in the upper thoracic esophagus" and pat was seen during prior hospital admission for swallow evaluation- at which time he was started on a full liquid diet - transitioning to puree/thin.  Swallow evaluation ordered - MBS.    Assessment / Plan / Recommendation  Clinical Impression  Patient familiar to this SLP from prior visit - he is more alert, and not consistently throat clearing as observed during prior evam. Pt tends to lean toward the right and needs encouragement/repositioning for upright neutral.  No focal CN deficits apparent and pt able to feed himself today with 4 graham crackers, 6 ounces applesauce, diet soda 16 ounces and 5 ounces of water.  Initially single cough noted with 2nd swallow of water - however no further coughing or throat clearing incidents  noted.  Prolonged oral manipulation, mastication with solids that was able to be cleared into pharynx with applesauce - pt reports this strategy/recommendation helpful.    Do not recommend pt have supervision so he can feed himself at his needed given oral delays.    Frequent eructation noted during po intake - and he admits to some refluxing.  Given his finding of slightly patulous esophagus with some debris in the upper thoracic esophagus - concern for esophageal dysfunction contributing to poor po present. Recommend dys3/thin diet and esophagram to assure intervention not warranted to manage suspected esophageal deficits.  SLP educated pt to findings/recommendations and posted swallow precaution signs- spoke to RN and changed diet order. SLP Visit Diagnosis: Dysphagia, oral phase (R13.11);Dysphagia, unspecified (R13.10)    Aspiration Risk  Mild aspiration risk    Diet Recommendation Thin liquid;Dysphagia 3 (Mech soft)   Liquid Administration via: Cup Medication Administration: Whole meds with puree Supervision: Staff to assist with self feeding Compensations: Slow rate;Small sips/bites;Minimize environmental distractions Postural Changes: Seated upright at 90 degrees;Remain upright for at least 30 minutes after po intake    Other  Recommendations Oral Care Recommendations: Oral care BID Other Recommendations: Have oral suction available    Recommendations for follow up therapy are one component of a multi-disciplinary discharge planning process, led by the attending physician.  Recommendations may be updated based on patient status, additional functional criteria and insurance authorization.  Follow up Recommendations Skilled nursing-short term rehab (<3 hours/day)      Assistance Recommended at Discharge    Functional Status Assessment Patient has had a recent decline in their functional status  and demonstrates the ability to make significant improvements in function in a reasonable and  predictable amount of time.  Frequency and Duration min 1 x/week  1 week       Prognosis Prognosis for Safe Diet Advancement: Good      Swallow Study   General Date of Onset: 11/18/22 HPI: Per admitting MD note, Manuel Kramer is a 67 y.o. male with medical history significant of   Chronic diarrhea,HTN, history of C-dif colitis,  DMII, seizure disorder,CKDIIIa, HLD,depression/anxiety, grade 1 diastolic dysfunction ef 53-66%, neurogenic bladder with indwelling foley, CVA7/2023 who now resides at [facility], who has recent interim history of admission 1/10-1/15/2024 with discharge diagnosis of sepsis from unknown source. Patient present today from NH with chief complaint of change in mental status per staff.  Unclear baseline.  Prior Chest imaging showed "Slightly patulous esophagus with some debris in the upper thoracic esophagus" and pat was seen during prior hospital admission for swallow evaluation- at which time he was started on a full liquid diet - transitioning to puree/thin.  Swallow evaluation ordered - MBS. Type of Study: Bedside Swallow Evaluation Previous Swallow Assessment: bse early 2023 and bse a few weeks ago Diet Prior to this Study: Dysphagia 1 (puree);Thin liquids Temperature Spikes Noted: No Respiratory Status: Room air History of Recent Intubation: No Behavior/Cognition: Alert;Requires cueing Oral Cavity Assessment: Within Functional Limits Oral Care Completed by SLP: No Oral Cavity - Dentition: Edentulous Vision: Impaired for self-feeding Self-Feeding Abilities: Able to feed self Patient Positioning: Upright in bed Baseline Vocal Quality: Normal Volitional Cough: Weak Volitional Swallow: Able to elicit    Oral/Motor/Sensory Function Overall Oral Motor/Sensory Function: Within functional limits   Ice Chips Ice chips: Not tested   Thin Liquid Thin Liquid: Within functional limits Other Comments: cough x1 on first bolus, however after initial    Nectar Thick  Nectar Thick Liquid: Not tested   Honey Thick Honey Thick Liquid: Not tested   Puree Puree: Within functional limits Presentation: Self Fed;Spoon   Solid     Solid: Impaired Oral Phase Impairments: Reduced labial seal;Impaired mastication Oral Phase Functional Implications: Impaired mastication;Right anterior spillage Pharyngeal Phase Impairments: Suspected delayed Swallow Other Comments: pt able to orally transit solids with use of applesauce -      Macario Golds 11/18/2022,4:39 PM  Kathleen Lime, MS [place] Office [phone] Pager [phone]

## 2022-11-19 DIAGNOSIS — G9341 Metabolic encephalopathy: Secondary | ICD-10-CM | POA: Diagnosis not present

## 2022-11-19 LAB — CBC
HCT: 24.8 % — ABNORMAL LOW (ref 39.0–52.0)
Hemoglobin: 8 g/dL — ABNORMAL LOW (ref 13.0–17.0)
MCH: 30 pg (ref 26.0–34.0)
MCHC: 32.3 g/dL (ref 30.0–36.0)
MCV: 92.9 fL (ref 80.0–100.0)
Platelets: 259 10*3/uL (ref 150–400)
RBC: 2.67 MIL/uL — ABNORMAL LOW (ref 4.22–5.81)
RDW: 15.6 % — ABNORMAL HIGH (ref 11.5–15.5)
WBC: 13.6 10*3/uL — ABNORMAL HIGH (ref 4.0–10.5)
nRBC: 0 % (ref 0.0–0.2)

## 2022-11-19 LAB — BASIC METABOLIC PANEL
Anion gap: 6 (ref 5–15)
BUN: 40 mg/dL — ABNORMAL HIGH (ref 8–23)
CO2: 28 mmol/L (ref 22–32)
Calcium: 8 mg/dL — ABNORMAL LOW (ref 8.9–10.3)
Chloride: 104 mmol/L (ref 98–111)
Creatinine, Ser: 2.98 mg/dL — ABNORMAL HIGH (ref 0.61–1.24)
GFR, Estimated: 23 mL/min — ABNORMAL LOW (ref >60)
Glucose, Bld: 214 mg/dL — ABNORMAL HIGH (ref 70–99)
Potassium: 3.9 mmol/L (ref 3.5–5.1)
Sodium: 138 mmol/L (ref 135–145)

## 2022-11-19 LAB — GLUCOSE, CAPILLARY
Glucose-Capillary: 203 mg/dL — ABNORMAL HIGH (ref 70–99)
Glucose-Capillary: 212 mg/dL — ABNORMAL HIGH (ref 70–99)
Glucose-Capillary: 201 mg/dL — ABNORMAL HIGH (ref 70–99)
Glucose-Capillary: 222 mg/dL — ABNORMAL HIGH (ref 70–99)

## 2022-11-19 LAB — PROCALCITONIN: Procalcitonin: 1.4 ng/mL

## 2022-11-19 LAB — MAGNESIUM: Magnesium: 1.9 mg/dL (ref 1.7–2.4)

## 2022-11-19 MED ORDER — AMLODIPINE BESYLATE 5 MG PO TABS
5.0000 mg | ORAL_TABLET | Freq: Once | ORAL | Status: AC
  Administered 2022-11-19: 5 mg via ORAL
  Filled 2022-11-19: qty 1

## 2022-11-19 MED ORDER — AMLODIPINE BESYLATE 10 MG PO TABS
10.0000 mg | ORAL_TABLET | Freq: Every day | ORAL | Status: DC
  Administered 2022-11-20 – 2022-11-21 (×2): 10 mg via ORAL
  Filled 2022-11-19 (×2): qty 1

## 2022-11-19 MED ORDER — FERROUS SULFATE 325 (65 FE) MG PO TABS
325.0000 mg | ORAL_TABLET | Freq: Every day | ORAL | Status: DC
  Administered 2022-11-20 – 2022-11-21 (×2): 325 mg via ORAL
  Filled 2022-11-19 (×2): qty 1

## 2022-11-19 MED ORDER — AMLODIPINE BESYLATE 5 MG PO TABS
5.0000 mg | ORAL_TABLET | Freq: Every day | ORAL | Status: DC
  Administered 2022-11-19: 5 mg via ORAL
  Filled 2022-11-19: qty 1

## 2022-11-19 NOTE — Progress Notes (Signed)
OT Cancellation Note  Patient Details Name: Manuel Kramer MRN: [id number] DOB: 1956/03/22   Cancelled Treatment:    Reason Eval/Treat Not Completed: Patient declined, no reason specified Patient was agreeable upon first entering the room. After setting up room patient declined to let go of blankets reporting he did not need to do this. Patient reported living at home alone at this time. Patietn was educated on how he needed to be able to move to be able to go back home. Patient reported " I am going to stay right where I am". When educated MD wanted to see how patient moves patient reported "tell him I am not doing well". OT to continue to follow and check back as schedule will allow Rennie Plowman, MS Acute Rehabilitation Department Office# [phone]  11/19/2022, 10:32 AM

## 2022-11-19 NOTE — Evaluation (Addendum)
Physical Therapy Evaluation Patient Details Name: Manuel Kramer MRN: [id number] DOB: 01/25/1956 Today's Date: 11/19/2022  History of Present Illness  67 yo male admitted to  hospital with complaints of confusion, possible UTI,  Acute metabolic encephalopathy and CHF. PMH: type II DM, seizures, CKD 3A, HTN, chronic diarrhea and history of C. difficile, HLD, depression, neurogenic bladder with chronic Foley, CVA with residual deficit.  Clinical Impression  Pt admitted with above diagnosis.  Eval limited, pt agreeable only to bed level activity, declines any mobility, rolling EOB etc. Pt is confused but able to express needs, asks for ice cream end of PT session.  Will follow in acute setting. It is unclear if pt is from LTC at [facility]-? (Update per SW after PT eval, pt is from [facility] long term care, TOC checking with dtr for confirmation). Will keep pt on PT caseload for now  Pt currently with functional limitations due to the deficits listed below (see PT Problem List). Pt will benefit from skilled PT to increase their independence and safety with mobility to allow discharge to the venue listed below.          Recommendations for follow up therapy are one component of a multi-disciplinary discharge planning process, led by the attending physician.  Recommendations may be updated based on patient status, additional functional criteria and insurance authorization.  Follow Up Recommendations Return to Long term care without f/u therapy Can patient physically be transported by private vehicle: No    Assistance Recommended at Discharge Frequent or constant Supervision/Assistance  Patient can return home with the following  Two people to help with walking and/or transfers;Two people to help with bathing/dressing/bathroom;Assist for transportation;Assistance with cooking/housework;Direct supervision/assist for financial management;Direct supervision/assist for medications management     Equipment Recommendations None recommended by PT  Recommendations for Other Services       Functional Status Assessment Patient has had a recent decline in their functional status and/or demonstrates limited ability to make significant improvements in function in a reasonable and predictable amount of time     Precautions / Restrictions Precautions Precautions: Fall Restrictions Weight Bearing Restrictions: No      Mobility  Bed Mobility               General bed mobility comments: refused EOB or rolling    Transfers                   General transfer comment: refused    Ambulation/Gait               General Gait Details: NT  Stairs            Wheelchair Mobility    Modified Rankin (Stroke Patients Only)       Balance Overall balance assessment:  (unable to assess, pt declines any mobility)                                           Pertinent Vitals/Pain Pain Assessment Pain Assessment: No/denies pain    Home Living Family/patient expects to be discharged to:: Skilled nursing facility                        Prior Function Prior Level of Function : Needs assist;Patient poor historian/Family not available             Mobility Comments: pt  unreliable historian, when last admitted in July 2023 required modA to max assist of 2 for bed mobility and to stand. Pt states he has no issues with mobility, reports independence at baseline       Hand Dominance        Extremity/Trunk Assessment   Upper Extremity Assessment Upper Extremity Assessment: Generalized weakness;Defer to OT evaluation    Lower Extremity Assessment Lower Extremity Assessment: Generalized weakness;LLE deficits/detail;RLE deficits/detail;Difficult to assess due to impaired cognition RLE Deficits / Details: AAROM grossly WFL, strength 2/5 LLE Deficits / Details: AAROM grossly WFL, strength 2/5       Communication    Communication: No difficulties  Cognition Arousal/Alertness: Awake/alert Behavior During Therapy: Flat affect Overall Cognitive Status: Impaired/Different from baseline Area of Impairment: Orientation, Memory, Following commands, Problem solving                 Orientation Level: Disoriented to, Place, Time, Situation   Memory: Decreased short-term memory Following Commands: Follows multi-step commands inconsistently, Follows one step commands with increased time     Problem Solving: Slow processing, Decreased initiation          General Comments      Exercises General Exercises - Lower Extremity Ankle Circles/Pumps: PROM, 5 reps, Both Heel Slides: AAROM, PROM, Both, 5 reps Straight Leg Raises: AAROM, PROM, Both, 10 reps   Assessment/Plan    PT Assessment Patient needs continued PT services  PT Problem List Decreased strength;Decreased range of motion;Decreased activity tolerance;Decreased mobility;Decreased knowledge of precautions;Decreased cognition;Decreased coordination       PT Treatment Interventions Therapeutic activities;Patient/family education;Functional mobility training    PT Goals (Current goals can be found in the Care Plan section)  Acute Rehab PT Goals PT Goal Formulation: Patient unable to participate in goal setting Time For Goal Achievement: 12/03/22 Potential to Achieve Goals: Poor    Frequency Min 2X/week     Co-evaluation               AM-PAC PT "6 Clicks" Mobility  Outcome Measure Help needed turning from your back to your side while in a flat bed without using bedrails?: Total Help needed moving from lying on your back to sitting on the side of a flat bed without using bedrails?: Total Help needed moving to and from a bed to a chair (including a wheelchair)?: Total Help needed standing up from a chair using your arms (e.g., wheelchair or bedside chair)?: Total Help needed to walk in hospital room?: Total Help needed climbing  3-5 steps with a railing? : Total 6 Click Score: 6    End of Session   Activity Tolerance: Other (comment) (self limiting/confusion) Patient left: in bed;with call bell/phone within reach;with bed alarm set   PT Visit Diagnosis: Other abnormalities of gait and mobility (R26.89);Difficulty in walking, not elsewhere classified (R26.2)    Time: 0175-1025 PT Time Calculation (min) (ACUTE ONLY): 13 min   Charges:   PT Evaluation $PT Eval Low Complexity: [place], PT  Acute Rehab Dept [facility]) [phone]  [facility] Weekend Pager [facility] only)  [phone]  11/19/2022   [facility] 11/19/2022, 2:44 PM

## 2022-11-19 NOTE — Progress Notes (Signed)
[name] Patient: Manuel Kramer GNF:[id number] DOB: Jul 17, 1956 DOA: 11/16/2022  DOS: the patient was seen and examined on 11/19/2022  Brief hospital course: PMH of type II DM, seizures, CKD 3A, HTN, chronic diarrhea and history of C. difficile, HLD, depression, neurogenic bladder with chronic Foley, CVA with residual deficit.  Currently present to the hospital with complaints of confusion.  Appears to have volume overloaded and receiving IV diuresis now. Assessment and Plan: Acute metabolic encephalopathy. Mentation appears to be improving suspect this is close to baseline. No new focal deficit. Minimize psychotropic medications. EEG negative.  No CT scan abnormality.  Possible UTI. Urine culture negative. Procalcitonin is elevated but not trending down.   Foley catheter changed in the ED. Monitor off of antibiotic given history of C. difficile.  Acute on chronic diastolic CHF. Patient was given IV diuresis.  Renal function worsening.  Currently holding IV diuresis. Monitor renal function for improvement.    Hypertensive urgency. Blood pressure elevated. Will add hydralazine. Monitor.  Chronic diarrhea. History of C. difficile. Monitor for now.  History of CVA with residual deficit. Speech therapy and PT OT consulted.  Esophageal dysmotility. Will check x-ray esophagus.  Type 2 diabetes mellitus, controlled with PVD Continue sliding scale insulin.  History of seizure disorder. EEG negative. Continue Keppra. Continue to hold gabapentin.  Anemia. Associated with chronic kidney disease. Received 1 PRBC transfusion. No active bleeding seen. Monitor.  CKD 3B. Renal function improving after diuresis. Monitor.  HLD. Continue statin.  Depression. Holding home medication in the setting of encephalopathy.  Neurogenic bladder. Currently has chronic Foley catheter.  Continue.   Subjective: No nausea no vomiting no fever no chills.  Adequate  urine output.  No abdominal pain.  Diarrhea improving.  Physical Exam: General: in Mild distress, No Rash Cardiovascular: S1 and S2 Present, No Murmur Respiratory: Good respiratory effort, Bilateral Air entry present. No Crackles, No wheezes Abdomen: Bowel Sound present, No tenderness Extremities: Still bilateral upper and lower extremity edema Neuro: Alert and oriented x self and place, no new focal deficit   Data Reviewed: I have Reviewed nursing notes, Vitals, and Lab results. Since last encounter, pertinent lab results CBC and BMP   . I have ordered test including CBC and BMP  . I have ordered imaging x-ray esophagus  .    Disposition: Status is: Inpatient Remains inpatient appropriate because: Monitor urine output and renal function.  SCDs Start: 11/16/22 2233   Family Communication: No one at bedside Level of care: Telemetry Continue telemetry for CHF Vitals:   11/19/22 0521 11/19/22 0524 11/19/22 0840 11/19/22 1214  BP: (!) 188/87 (!) 188/90 (!) 200/90 (!) 191/97  Pulse: 66 65 63 65  Resp: 18 18 16 19   Temp: 98.1 F (36.7 C) 98.1 F (36.7 C) 97.7 F (36.5 C)   TempSrc: Oral Oral Oral   SpO2: 99% 100% 100% 97%  Weight:  78.5 kg    Height:         Author: Berle Mull, MD 11/19/2022 12:24 PM  Please look on [email or web address] to find out who is on call.

## 2022-11-20 ENCOUNTER — Inpatient Hospital Stay (HOSPITAL_COMMUNITY): Payer: Medicare Other

## 2022-11-20 DIAGNOSIS — G9341 Metabolic encephalopathy: Secondary | ICD-10-CM | POA: Diagnosis not present

## 2022-11-20 LAB — TYPE AND SCREEN
ABO/RH(D): O POS
Antibody Screen: NEGATIVE
Unit division: 0

## 2022-11-20 LAB — GLUCOSE, CAPILLARY
Glucose-Capillary: 162 mg/dL — ABNORMAL HIGH (ref 70–99)
Glucose-Capillary: 169 mg/dL — ABNORMAL HIGH (ref 70–99)
Glucose-Capillary: 211 mg/dL — ABNORMAL HIGH (ref 70–99)
Glucose-Capillary: 206 mg/dL — ABNORMAL HIGH (ref 70–99)

## 2022-11-20 LAB — PROCALCITONIN: Procalcitonin: 0.88 ng/mL

## 2022-11-20 LAB — CBC
HCT: 24.3 % — ABNORMAL LOW (ref 39.0–52.0)
Hemoglobin: 7.9 g/dL — ABNORMAL LOW (ref 13.0–17.0)
MCH: 30 pg (ref 26.0–34.0)
MCHC: 32.5 g/dL (ref 30.0–36.0)
MCV: 92.4 fL (ref 80.0–100.0)
Platelets: 262 K/uL (ref 150–400)
RBC: 2.63 MIL/uL — ABNORMAL LOW (ref 4.22–5.81)
RDW: 15.8 % — ABNORMAL HIGH (ref 11.5–15.5)
WBC: 12.8 K/uL — ABNORMAL HIGH (ref 4.0–10.5)
nRBC: 0 % (ref 0.0–0.2)

## 2022-11-20 LAB — BASIC METABOLIC PANEL WITH GFR
Anion gap: 6 (ref 5–15)
BUN: 42 mg/dL — ABNORMAL HIGH (ref 8–23)
CO2: 27 mmol/L (ref 22–32)
Calcium: 8 mg/dL — ABNORMAL LOW (ref 8.9–10.3)
Chloride: 102 mmol/L (ref 98–111)
Creatinine, Ser: 2.97 mg/dL — ABNORMAL HIGH (ref 0.61–1.24)
GFR, Estimated: 23 mL/min — ABNORMAL LOW
Glucose, Bld: 172 mg/dL — ABNORMAL HIGH (ref 70–99)
Potassium: 3.8 mmol/L (ref 3.5–5.1)
Sodium: 135 mmol/L (ref 135–145)

## 2022-11-20 LAB — MAGNESIUM: Magnesium: 1.9 mg/dL (ref 1.7–2.4)

## 2022-11-20 LAB — BPAM RBC
Blood Product Expiration Date: 202402242359
ISSUE DATE / TIME: 202401191539
Unit Type and Rh: 5100

## 2022-11-20 MED ORDER — ORAL CARE MOUTH RINSE: 15.0000 mL | OROMUCOSAL | Status: DC | PRN

## 2022-11-20 MED ORDER — NEPRO/CARBSTEADY PO LIQD
237.0000 mL | Freq: Two times a day (BID) | ORAL | Status: DC
  Administered 2022-11-20: 237 mL via ORAL
  Filled 2022-11-20 (×3): qty 237

## 2022-11-20 MED ORDER — ENSURE MAX PROTEIN PO LIQD
11.0000 [oz_av] | Freq: Two times a day (BID) | ORAL | Status: DC
  Administered 2022-11-20: 11 [oz_av] via ORAL
  Filled 2022-11-20: qty 330

## 2022-11-20 MED ORDER — DOXAZOSIN MESYLATE 2 MG PO TABS
1.0000 mg | ORAL_TABLET | Freq: Every day | ORAL | Status: DC
  Administered 2022-11-20 – 2022-11-21 (×2): 1 mg via ORAL
  Filled 2022-11-20 (×2): qty 1

## 2022-11-20 NOTE — Progress Notes (Signed)
OT Cancellation Note  Patient Details Name: Manuel Kramer MRN: [id number] DOB: January 24, 1956   Cancelled Treatment:    Reason Eval/Treat Not Completed: Patient at procedure or test/ unavailable Patient is off hall at procedure. OT to continue to follow and check back as schedule will allow.  Rennie Plowman, MS Acute Rehabilitation Department Office# [phone]  11/20/2022, 12:21 PM

## 2022-11-20 NOTE — Progress Notes (Signed)
Initial Nutrition Assessment  INTERVENTION:   -Ensure MAX Protein po BID, each supplement provides 150 kcal and 30 grams of protein   NUTRITION DIAGNOSIS:   Increased nutrient needs related to chronic illness (CHF, h/o CVA) as evidenced by estimated needs.  GOAL:   Patient will meet greater than or equal to 90% of their needs  MONITOR:   PO intake, Supplement acceptance, Labs, Weight trends, I & O's  REASON FOR ASSESSMENT:   Consult Assessment of nutrition requirement/status  ASSESSMENT:   67 y.o. male with medical history significant of   Chronic diarrhea,HTN, history of C-dif colitis,  DMII, seizure disorder,CKDIIIa, HLD,depression/anxiety, grade 1 diastolic dysfunction ef 66-06%, neurogenic bladder with indwelling foley, CVA7/2023 who now resides at [facility], who has recent interim history of admission 1/10-1/15/2024 with discharge diagnosis of sepsis from unknown source. Patient present today from NH with chief complaint of change in mental status per staff.  Patient in room, sleeping in bed. Lunch tray still sitting out, pt consumed ~50% of meal of meatloaf, mac and cheese, broccoli and vegetable soup.   S/p esophagram today showed diffusely patulous esophagus with moderate dysmotility.   Will order Ensure Max for additional protein in diet.   Per weight records, pt has lost 4 lbs since 1/15. Pt has been volume overloaded so may represent fluid losses.   Medications: Risaqaud, Ferrous sulfate  Labs reviewed: CBGs: 162-222 Low iron   NUTRITION - FOCUSED PHYSICAL EXAM:  Flowsheet Row Most Recent Value  Orbital Region No depletion  Upper Arm Region No depletion  Thoracic and Lumbar Region No depletion  Buccal Region No depletion  Temple Region Mild depletion  Clavicle Bone Region No depletion  Clavicle and Acromion Bone Region No depletion  Scapular Bone Region No depletion  Dorsal Hand No depletion  Patellar Region No depletion  Anterior Thigh Region No  depletion  Posterior Calf Region No depletion  Edema (RD Assessment) Severe  [severe RLE, mild LLE and generalized edema]       Diet Order:   Diet Order             DIET DYS 3 Room service appropriate? No; Fluid consistency: Thin  Diet effective now                   EDUCATION NEEDS:   Not appropriate for education at this time  Skin:  Skin Assessment: Reviewed RN Assessment  Last BM:  1/22 -type 7  Height:   Ht Readings from Last 1 Encounters:  11/17/22 _0  (1.676 m)    Weight:   Wt Readings from Last 1 Encounters:  11/20/22 72.2 kg    BMI:  Body mass index is 25.69 kg/m.  Estimated Nutritional Needs:   Kcal:  1800-2000  Protein:  90-105g  Fluid:  1.8L/day  Clayton Bibles, MS, RD, LDN Inpatient Clinical Dietitian Contact information available via Amion

## 2022-11-20 NOTE — Progress Notes (Signed)
Speech Language Pathology Treatment: Dysphagia  Patient Details Name: Manuel Kramer MRN: [id number] DOB: 04-03-1956 Today's Date: 11/20/2022 Time: 1245-1300 SLP Time Calculation (min) (ACUTE ONLY): 15 min  Assessment / Plan / Recommendation Clinical Impression  Patient seen by SLP for skilled treatment focused on dysphagia goals. When SLP arrived into room, patient semi reclined in bed and leaning to right side.  He declined to have assistance repositioning. He was able to feed himself using utensils without significant difficulty but he was unable to open containers or take lids off drinks. He would verbally decline assistance but when SLP saw him continuing to struggle to open a drink, he was accepting and appreciative of assistance. SLP observed prolonged mastication and oral clearance of soft solid PO's but only one instance of delayed throat clearing. Patient appears to be tolerating current PO diet. SLP will follow briefly to ensure continued toleration.   HPI HPI: Per admitting MD note, Manuel Kramer is a 67 y.o. male with medical history significant of   Chronic diarrhea,HTN, history of C-dif colitis,  DMII, seizure disorder,CKDIIIa, HLD,depression/anxiety, grade 1 diastolic dysfunction ef 70-35%, neurogenic bladder with indwelling foley, CVA7/2023 who now resides at [facility], who has recent interim history of admission 1/10-1/15/2024 with discharge diagnosis of sepsis from unknown source. Patient present today from NH with chief complaint of change in mental status per staff.  Unclear baseline.  Prior Chest imaging showed "Slightly patulous esophagus with some debris in the upper thoracic esophagus" and pat was seen during prior hospital admission for swallow evaluation- at which time he was started on a full liquid diet - transitioning to puree/thin.  Swallow evaluation ordered - MBS. Esophagram on 1/22 showed: diffusely patulous  esophagus with moderate dysmotility with tertiary  contractions. No  stricture, mass or hiatal hernia seen on limited exam.      SLP Plan  Continue with current plan of care      Recommendations for follow up therapy are one component of a multi-disciplinary discharge planning process, led by the attending physician.  Recommendations may be updated based on patient status, additional functional criteria and insurance authorization.    Recommendations  Diet recommendations: Dysphagia 3 (mechanical soft);Thin liquid Liquids provided via: Cup;Straw Medication Administration: Whole meds with puree Supervision: Patient able to self feed;Full supervision/cueing for compensatory strategies Compensations: Slow rate;Small sips/bites;Minimize environmental distractions Postural Changes and/or Swallow Maneuvers: Seated upright 90 degrees                Oral Care Recommendations: Oral care BID Follow Up Recommendations: Skilled nursing-short term rehab (<3 hours/day) Assistance recommended at discharge: Frequent or constant Supervision/Assistance SLP Visit Diagnosis: Dysphagia, oral phase (R13.11);Dysphagia, unspecified (R13.10) Plan: Continue with current plan of care         Sonia Baller, MA, CCC-SLP Speech Therapy

## 2022-11-20 NOTE — Progress Notes (Signed)
[name] Patient: Manuel Kramer OHY:[id number] DOB: 18-Aug-1956 DOA: 11/16/2022  DOS: the patient was seen and examined on 11/20/2022  Brief hospital course: PMH of type II DM, seizures, CKD 3A, HTN, chronic diarrhea and history of C. difficile, HLD, depression, neurogenic bladder with chronic Foley, CVA with residual deficit.  Currently present to the hospital with complaints of confusion.  Appears to have volume overloaded and receiving IV diuresis now.  Assessment and Plan: Acute metabolic encephalopathy. Chronic lethargy.  Narcolepsy Mentation appears to be improving suspect this is close to baseline. No new focal deficit. Minimize psychotropic medications. EEG negative.  No CT scan abnormality. Appears to be a progressive decline in his strength with chronic deconditioning. Sleeping throughout the day has to be woken up for exam every time. Suspect he may benefit from medication like modafinil.  Defer to PCP to initiate this therapy.  Possible UTI. Urine culture negative. Procalcitonin is elevated but continues to trend down.   Foley catheter changed in the ED. Monitor off of antibiotic given history of C. difficile.  Acute on chronic diastolic CHF. Patient was given IV diuresis.  Renal function worsening.  Currently holding IV diuresis. Monitor renal function for improvement.    Hypertensive urgency. Blood pressure remains elevated. On Norvasc 10 mg, carvedilol 25 mg, clonidine 0.3 mg, hydralazine 100 mg, Isordil 40 mg, Aldactone 25 mg.  Add Cardura.  Chronic diarrhea. History of C. difficile. Monitor for now.  History of CVA with residual deficit. Speech therapy and PT OT consulted.  Esophageal dysmotility. X-ray cervical shows patulous esophagus.  Placing the patient at high risk for dysphagia.  Type 2 diabetes mellitus, controlled with PVD Continue sliding scale insulin.  History of seizure disorder. EEG negative. Continue  Keppra. Continue to hold gabapentin.  Anemia. Associated with chronic kidney disease. Received 1 PRBC transfusion. No active bleeding seen. Monitor.  CKD 3B. Renal function improving after diuresis. Monitor.  HLD. Continue statin.  Depression. Holding home medication in the setting of encephalopathy.  Neurogenic bladder. Currently has chronic Foley catheter.  Continue.  Catheter changed in the ED.  Adult failure to thrive. Progressively declining condition for last 1 year. Add Nepro. Body mass index is 25.69 kg/m. Nutrition Problem: Increased nutrient needs Etiology: chronic illness (CHF, h/o CVA) Nutrition Interventions: Interventions: Premier Protein    Subjective: N denies any acute pain.  No fever no chills.  Physical Exam: General: in Mild distress, No Rash Cardiovascular: S1 and S2 Present, No Murmur Respiratory: Good respiratory effort, Bilateral Air entry present. No Crackles, No wheezes Abdomen: Bowel Sound present, No tenderness Extremities: No edema Neuro: Lethargic and oriented x2, no new focal deficit   Data Reviewed: I have Reviewed nursing notes, Vitals, and Lab results. Since last encounter, pertinent lab results CBC and BMP   . I have ordered test including CBC and BMP  .     Disposition: Status is: Inpatient Remains inpatient appropriate because: Monitor urine output and renal function.  SCDs Start: 11/16/22 2233   Family Communication: No one at bedside, discussed with the son on the phone. Level of care: Telemetry Continue telemetry for CHF Vitals:   11/20/22 0938 11/20/22 1352 11/20/22 1433 11/20/22 1655  BP: (!) 172/89 (!) 165/87 (!) 168/89 (!) 157/79  Pulse:  64    Resp:  20    Temp:  98 F (36.7 C)    TempSrc:      SpO2:  98%    Weight:      Height:  Author: Berle Mull, MD 11/20/2022 6:59 PM  Please look on [email or web address] to find out who is on call.

## 2022-11-20 NOTE — Care Management Important Message (Signed)
Important Message  Patient Details IM Letter given. Name: Manuel Kramer MRN: [id number] Date of Birth: [birth year]   Medicare Important Message Given:  Yes     Kerin Salen 11/20/2022, 1:33 PM

## 2022-11-21 ENCOUNTER — Other Ambulatory Visit: Payer: Self-pay

## 2022-11-21 DIAGNOSIS — G9341 Metabolic encephalopathy: Secondary | ICD-10-CM | POA: Diagnosis not present

## 2022-11-21 LAB — BASIC METABOLIC PANEL
Anion gap: 8 (ref 5–15)
BUN: 39 mg/dL — ABNORMAL HIGH (ref 8–23)
CO2: 24 mmol/L (ref 22–32)
Calcium: 7.8 mg/dL — ABNORMAL LOW (ref 8.9–10.3)
Chloride: 101 mmol/L (ref 98–111)
Creatinine, Ser: 2.74 mg/dL — ABNORMAL HIGH (ref 0.61–1.24)
GFR, Estimated: 25 mL/min — ABNORMAL LOW (ref >60)
Glucose, Bld: 150 mg/dL — ABNORMAL HIGH (ref 70–99)
Potassium: 4.1 mmol/L (ref 3.5–5.1)
Sodium: 133 mmol/L — ABNORMAL LOW (ref 135–145)

## 2022-11-21 LAB — CBC
HCT: 22.7 % — ABNORMAL LOW (ref 39.0–52.0)
Hemoglobin: 7.5 g/dL — ABNORMAL LOW (ref 13.0–17.0)
MCH: 30.4 pg (ref 26.0–34.0)
MCHC: 33 g/dL (ref 30.0–36.0)
MCV: 91.9 fL (ref 80.0–100.0)
Platelets: 231 10*3/uL (ref 150–400)
RBC: 2.47 MIL/uL — ABNORMAL LOW (ref 4.22–5.81)
RDW: 15.5 % (ref 11.5–15.5)
WBC: 12.7 10*3/uL — ABNORMAL HIGH (ref 4.0–10.5)
nRBC: 0 % (ref 0.0–0.2)

## 2022-11-21 LAB — CULTURE, BLOOD (ROUTINE X 2)
Culture: NO GROWTH
Special Requests: ADEQUATE

## 2022-11-21 LAB — MAGNESIUM: Magnesium: 1.6 mg/dL — ABNORMAL LOW (ref 1.7–2.4)

## 2022-11-21 MED ORDER — MAGNESIUM OXIDE -MG SUPPLEMENT 400 (240 MG) MG PO TABS
400.0000 mg | ORAL_TABLET | Freq: Every day | ORAL | Status: DC
  Administered 2022-11-21: 400 mg via ORAL
  Filled 2022-11-21: qty 1

## 2022-11-21 MED ORDER — MODAFINIL 200 MG PO TABS
100.0000 mg | ORAL_TABLET | Freq: Every day | ORAL | Status: DC
  Administered 2022-11-21: 100 mg via ORAL
  Filled 2022-11-21: qty 1

## 2022-11-21 MED ORDER — MAGNESIUM OXIDE 400 MG PO TABS
400.0000 mg | ORAL_TABLET | Freq: Every day | ORAL | 0 refills | Status: AC
  Filled 2022-11-21: qty 30, 30d supply, fill #0

## 2022-11-21 MED ORDER — MODAFINIL 100 MG PO TABS: 100.0000 mg | ORAL_TABLET | Freq: Every day | ORAL | 0 refills | Status: AC

## 2022-11-21 MED ORDER — HYDRALAZINE HCL 100 MG PO TABS
100.0000 mg | ORAL_TABLET | Freq: Three times a day (TID) | ORAL | 0 refills | Status: DC
  Filled 2022-11-21: qty 90, 30d supply, fill #0

## 2022-11-21 MED ORDER — NEPRO/CARBSTEADY PO LIQD: 237.0000 mL | Freq: Two times a day (BID) | ORAL | 0 refills | Status: AC

## 2022-11-21 MED ORDER — RISAQUAD PO CAPS
2.0000 | ORAL_CAPSULE | Freq: Every day | ORAL | 0 refills | Status: AC
  Filled 2022-11-21: qty 60, 30d supply, fill #0

## 2022-11-21 MED ORDER — FERROUS SULFATE 325 (65 FE) MG PO TABS
325.0000 mg | ORAL_TABLET | Freq: Every day | ORAL | 0 refills | Status: AC
  Filled 2022-11-21: qty 30, 30d supply, fill #0

## 2022-11-21 MED ORDER — DOXAZOSIN MESYLATE 2 MG PO TABS
2.0000 mg | ORAL_TABLET | Freq: Every day | ORAL | 0 refills | Status: AC
  Filled 2022-11-21: qty 30, 30d supply, fill #0

## 2022-11-21 MED ORDER — MAGNESIUM SULFATE 2 GM/50ML IV SOLN: 2.0000 g | Freq: Once | INTRAVENOUS | Status: DC

## 2022-11-21 NOTE — Progress Notes (Signed)
OT Cancellation Note  Patient Details Name: Manuel Kramer MRN: [id number] DOB: 1956/04/27   Cancelled Treatment:    Reason Eval/Treat Not Completed: OT screened, no needs identified, will sign off Per TOC notes, patient is a LTC resident at [facility] and plans to transition back at time of d/c from hospital. Defer skilled OT evaluation to SNF at this time as they know patients baseline best. OT signing off at this time. Thank you for the referral.  Rennie Plowman, MS Acute Rehabilitation Department Office# [phone]  11/21/2022, 7:58 AM

## 2022-11-21 NOTE — Discharge Summary (Signed)
Physician Discharge Summary   Patient: Manuel Kramer MRN: [id number] DOB: [birth year]  Admit date:     11/16/2022  Discharge date: 11/21/22  Discharge Physician: Lynden OxfordPranav Orazio Weller  PCP: Diamantina ProvidenceAnderson, Takela N, FNP  Recommendations at discharge: Started on modafinil on discharge.monitor for cardiac side effect for 1 week with frequent vitals Pt needs adequate foley care followed. Monitor for risk of aspiration, must be awake for meals.  May benefit from referral to psychiatry   Follow-up Information     Diamantina ProvidenceAnderson, Takela N, FNP. Schedule an appointment as soon as possible for a visit in 1 week(s).   Specialty: Nurse Practitioner Contact information: [address] [place] [address] [phone]                Discharge Diagnoses: Principal Problem:   UTI (urinary tract infection) Active Problems:   Seizure disorder (HCC)   Bilateral lower extremity edema   Depression with anxiety   Hyperlipidemia   Anemia of chronic disease   Acute on chronic diastolic congestive heart failure [facility](HCC)   Essential hypertension  Hospital Course: PMH of type II DM, seizures, CKD 3A, HTN, chronic diarrhea and history of C. difficile, HLD, depression, neurogenic bladder with chronic Foley, CVA with residual deficit.  Currently present to the hospital with complaints of confusion.  Appears to have volume overloaded and treated with IV diuresis. Assessment and Plan  Acute metabolic encephalopathy. Chronic lethargy.  Narcolepsy Mentation appears to be improving suspect this is close to baseline. No new focal deficit. Minimize psychotropic medications. EEG negative.  No CT scan abnormality. Appears to be a progressive decline in his strength with chronic deconditioning. Sleeping throughout the day has to be woken up for exam every time.  Also have been having episodes where he is deliberately not engaging with exam. He may benefit from medication like modafinil. Will initiate on  discharge and monitor response   Possible UTI. Urine culture negative. Procalcitonin is elevated but continues to trend down.   Foley catheter changed in the ED.   Acute on chronic diastolic CHF. Patient was given IV diuresis.  Renal function worsening. Resume torsemide on discharge.   Hypertensive urgency. Blood pressure remains elevated. On Norvasc 10 mg, carvedilol 25 mg, clonidine 0.3 mg, hydralazine 100 mg, Isordil 40 mg, Aldactone 25 mg.  Add Cardura.   Chronic diarrhea. History of C. difficile. Resolved.    History of CVA with residual deficit. Speech therapy and PT OT consulted. Continue at discharge.    Esophageal dysmotility. X-ray cervical shows patulous esophagus.  Placing the patient at high risk for dysphagia.   Type 2 diabetes mellitus, controlled with PVD Continue sliding scale insulin home regimen.   History of seizure disorder. EEG negative. Continue Keppra. Continue to hold gabapentin.   Anemia. Associated with chronic kidney disease. Received 1 PRBC transfusion. No active bleeding seen. Hb has been stable. Add iron and MVI. Monitor.  CKD 3B. Renal function improved initially after diuresis and worsen after that.  Monitor.   HLD. Continue statin.   Depression. Will initiate on modafinil. May benefit the condition as well.  May benefit from referral to psych outpt.    Neurogenic bladder. Currently has chronic Foley catheter.  Continue.  Catheter changed in the ED.   Adult failure to thrive. Progressively declining condition for last 1 year. Add Nepro. Body mass index is 25.69 kg/m. Nutrition Problem: Increased nutrient needs Etiology: chronic illness (CHF, h/o CVA) Nutrition Interventions: Interventions: Premier Protein    Consultants:  none  Procedures performed:  none  DISCHARGE MEDICATION: Allergies as of 11/21/2022       Reactions   Codeine Other (See Comments)   On MAR, unknown reaction   Iodinated Contrast Media Other  (See Comments)   On MAR, unknown reaction   Pentazocine Other (See Comments)   On MAR, unknown reaction   Statins Other (See Comments)   On MAR, unknown reaction        Medication List     STOP taking these medications    gabapentin 100 MG capsule Commonly known as: NEURONTIN   Garlic 500 MG Caps   insulin glargine 100 UNIT/ML Solostar Pen Commonly known as: LANTUS   potassium chloride SA 20 MEQ tablet Commonly known as: KLOR-CON M       TAKE these medications    acetaminophen 500 MG tablet Commonly known as: TYLENOL Take 1 tablet (500 mg total) by mouth every 6 (six) hours as needed. What changed: reasons to take this   acidophilus Caps capsule Take 2 capsules by mouth daily. Start taking on: November 22, 2022   amLODipine 10 MG tablet Commonly known as: NORVASC Take 1 tablet (10 mg total) by mouth daily.   aspirin EC 81 MG tablet Take 81 mg by mouth daily. Swallow whole.   atorvastatin 40 MG tablet Commonly known as: LIPITOR Take 1 tablet (40 mg total) by mouth daily.   carvedilol 25 MG tablet Commonly known as: COREG Take 1 tablet (25 mg total) by mouth 2 (two) times daily with a meal.   cholecalciferol 25 MCG (1000 UNIT) tablet Commonly known as: VITAMIN D3 Take 2,000 Units by mouth daily.   Cinnamon 500 MG capsule Take 500 mg by mouth daily.   cloNIDine 0.3 MG tablet Commonly known as: CATAPRES Take 1 tablet (0.3 mg total) by mouth 3 (three) times daily.   clopidogrel 75 MG tablet Commonly known as: PLAVIX Take 75 mg by mouth daily.   doxazosin 2 MG tablet Commonly known as: CARDURA Take 1 tablet (2 mg total) by mouth daily. Start taking on: November 22, 2022   feeding supplement (NEPRO CARB STEADY) Liqd Take 237 mLs by mouth 2 (two) times daily between meals.   ferrous sulfate 325 (65 FE) MG tablet Take 1 tablet (325 mg total) by mouth daily with breakfast. Start taking on: November 22, 2022   hydrALAZINE 100 MG tablet Commonly  known as: APRESOLINE Take 1 tablet (100 mg total) by mouth 3 (three) times daily. What changed: when to take this   insulin aspart 100 UNIT/ML injection Commonly known as: novoLOG Inject 3 Units into the skin 3 (three) times daily with meals. If eats more than 50% of meals What changed:  how much to take additional instructions   ipratropium 0.06 % nasal spray Commonly known as: ATROVENT Place 2 sprays into both nostrils 2 (two) times daily.   isosorbide dinitrate 40 MG tablet Commonly known as: ISORDIL Take 40 mg by mouth 3 (three) times daily.   levETIRAcetam 500 MG tablet Commonly known as: KEPPRA Take 1 tablet (500 mg total) by mouth 2 (two) times daily.   loratadine 10 MG tablet Commonly known as: CLARITIN Take 10 mg by mouth every other day.   magnesium oxide 400 (240 Mg) MG tablet Commonly known as: MAG-OX Take 1 tablet (400 mg total) by mouth daily.   modafinil 100 MG tablet Commonly known as: PROVIGIL Take 1 tablet (100 mg total) by mouth daily.   multivitamin with minerals Tabs  tablet Take 1 tablet by mouth daily.   niacin 100 MG tablet Commonly known as: VITAMIN B3 Take 100 mg by mouth 2 (two) times daily.   sertraline 50 MG tablet Commonly known as: ZOLOFT Take 50 mg by mouth daily.   sodium bicarbonate 650 MG tablet Take 1,300 mg by mouth 2 (two) times daily.   spironolactone 25 MG tablet Commonly known as: ALDACTONE Take 1 tablet (25 mg total) by mouth daily.   Torsemide 40 MG Tabs Take 40 mg by mouth daily.       Disposition: SNF Diet recommendation: Dysphagia type 3 thin Liquid  Discharge Exam: Vitals:   11/20/22 2036 11/20/22 2307 11/21/22 0500 11/21/22 0507  BP: (!) 165/88 (!) 157/89  (!) 181/90  Pulse: 68 68  63  Resp: 18   16  Temp: 97.9 F (36.6 C)   97.9 F (36.6 C)  TempSrc: Oral   Oral  SpO2: 100%   100%  Weight:   68.3 kg   Height:       General: Appear in no distress; no visible Abnormal Neck Mass Or lumps,  Conjunctiva normal Cardiovascular: S1 and S2 Present, no Murmur, Respiratory: good respiratory effort, Bilateral Air entry present and CTA, no Crackles, no wheezes Abdomen: Bowel Sound present, Non tender  Extremities: chronic bilateral upper and lower edema stable and improving Neurology: alert and oriented to place and person  Filed Weights   11/19/22 0524 11/20/22 0500 11/21/22 0500  Weight: 78.5 kg 72.2 kg 68.3 kg   Condition at discharge: stable  The results of significant diagnostics from this hospitalization (including imaging, microbiology, ancillary and laboratory) are listed below for reference.   Imaging Studies: DG ESOPHAGUS W SINGLE CM (SOL OR THIN BA)  Result Date: 11/20/2022 CLINICAL DATA:  Patient with history of CVA, dysphagia for several years per chart currently admitted with altered mental status. CT chest showed slightly patulous esophagus with some debris in the upper thoracic esophagus. Request for esophagram to evaluate for dysmotility. On exam today patient denies any issues with dysphagia, GERD, vomiting or regurgitation. EXAM: ESOPHAGUS/BARIUM SWALLOW/TABLET STUDY TECHNIQUE: Single contrast examination was performed using thin liquid barium. This exam was performed by Lynnette Caffey, PA-C, and was supervised and interpreted by Carey Bullocks, MD. FLUOROSCOPY: Radiation Exposure Index (as provided by the fluoroscopic device): 25.80 mGy Kerma COMPARISON:  CT chest w/o contrast 11/09/22 FINDINGS: Swallowing: Appears normal. No vestibular penetration or aspiration seen. Pharynx: Unremarkable. Esophagus: Diffusely patulous esophagus. Esophageal motility: Moderate dysmotility with tertiary contractions. Hiatal Hernia: None visualized on limited exam. Mild gastric distention noted. Gastroesophageal reflux: None visualized on limited exam. Ingested 13 mm barium tablet: Not given - patient declined to attempt. Other: Limited exam due to patient weakness and difficulty following  commands. Exam performed in supine AP only. IMPRESSION: Single contrast esophagram significant for diffusely patulous esophagus with moderate dysmotility with tertiary contractions. No stricture, mass or hiatal hernia seen on limited exam. Electronically Signed   By: Carey Bullocks M.D.   On: 11/20/2022 09:28   ECHOCARDIOGRAM LIMITED  Result Date: 11/17/2022    ECHOCARDIOGRAM LIMITED REPORT   Patient Name:   [facility] Date of Exam: 11/17/2022 Medical Rec #:  [id number]        Height:       66.0 in Accession #:    [id number]       Weight:       168.4 lb Date of Birth:  05-Jan-1956        BSA:  1.859 m Patient Age:    66 years         BP:           159/74 mmHg Patient Gender: M                HR:           62 bpm. Exam Location:  Inpatient Procedure: Limited Echo, Limited Color Doppler and Cardiac Doppler Indications:    CHF-Acute Diastolic I50.31  History:        Patient has prior history of Echocardiogram examinations, most                 recent 11/09/2022. Signs/Symptoms:Altered Mental Status; Risk                 Factors:Current Smoker, Diabetes and Hypertension.  Sonographer:    Aron Baba Referring Phys: [id number] SARA-MAIZ A THOMAS IMPRESSIONS  1. Limited study. Left ventricular ejection fraction, by estimation, is 60 to 65%. The left ventricle has normal function. The left ventricle has no regional wall motion abnormalities. There is severe left ventricular hypertrophy. Diastolic function was  not assessed  2. Right ventricular systolic function is normal. The right ventricular size is mildly enlarged. There is mildly elevated pulmonary artery systolic pressure. The estimated right ventricular systolic pressure is 41.4 mmHg.  3. The mitral valve is normal in structure. Trivial mitral valve regurgitation.  4. The aortic valve was not well visualized. Aortic valve regurgitation is mild. Aortic valve sclerosis/calcification is present, without any evidence of aortic stenosis.  5. The inferior  vena cava is normal in size with greater than 50% respiratory variability, suggesting right atrial pressure of 3 mmHg. FINDINGS  Left Ventricle: Left ventricular ejection fraction, by estimation, is 60 to 65%. The left ventricle has normal function. The left ventricle has no regional wall motion abnormalities. The left ventricular internal cavity size was normal in size. There is  severe left ventricular hypertrophy. Right Ventricle: The right ventricular size is mildly enlarged. No increase in right ventricular wall thickness. Right ventricular systolic function is normal. There is mildly elevated pulmonary artery systolic pressure. The tricuspid regurgitant velocity is 3.10 m/s, and with an assumed right atrial pressure of 3 mmHg, the estimated right ventricular systolic pressure is 41.4 mmHg. Pericardium: Trivial pericardial effusion is present. Mitral Valve: The mitral valve is normal in structure. Trivial mitral valve regurgitation. Aortic Valve: The aortic valve was not well visualized. Aortic valve regurgitation is mild. Aortic regurgitation PHT measures 663 msec. Aortic valve sclerosis/calcification is present, without any evidence of aortic stenosis. Venous: The inferior vena cava is normal in size with greater than 50% respiratory variability, suggesting right atrial pressure of 3 mmHg. Additional Comments: Spectral Doppler performed. Color Doppler performed.  LEFT VENTRICLE PLAX 2D LVIDd:         4.50 cm LVIDs:         3.00 cm LV PW:         1.90 cm LV IVS:        1.10 cm  LEFT ATRIUM         Index LA diam:    3.70 cm 1.99 cm/m  AORTIC VALVE AI PHT:      663 msec MITRAL VALVE               TRICUSPID VALVE MV Area (PHT): 4.39 cm    TR Peak grad:   38.4 mmHg MV Decel Time: 173 msec    TR Vmax:  310.00 cm/s MV E velocity: 82.60 cm/s MV A velocity: 77.10 cm/s MV E/A ratio:  1.07 Epifanio Lesches MD Electronically signed by Epifanio Lesches MD Signature Date/Time: 11/17/2022/5:28:26 PM    Final     EEG adult  Result Date: 11/17/2022 Charlsie Quest, MD     11/17/2022  5:15 PM Patient Name: MAXMILIAN TROSTEL MRN: [id number] Epilepsy Attending: Charlsie Quest Referring Physician/Provider: Joseph Art, DO Date: 11/17/2022 Duration: 24.05 mins Patient history: 67yo M with ams. EEG to evaluate for seizure Level of alertness: Awake, asleep AEDs during EEG study: LEV, GBP Technical aspects: This EEG study was done with scalp electrodes positioned according to the 10-20 International system of electrode placement. Electrical activity was reviewed with band pass filter of 1-70Hz , sensitivity of 7 uV/mm, display speed of 76mm/sec with a 60Hz  notched filter applied as appropriate. EEG data were recorded continuously and digitally stored.  Video monitoring was available and reviewed as appropriate. Description: No clear posterior dominant rhythm was seen. Sleep was characterized by vertex waves, sleep spindles (12 to 14 Hz), maximal frontocentral region. EEG showed continuous generalized 5 to 6 Hz theta slowing. Hyperventilation and photic stimulation were not performed.   ABNORMALITY - Continuous slow, generalized IMPRESSION: This study is suggestive of moderate diffuse encephalopathy, nonspecific etiology. No seizures or epileptiform discharges were seen throughout the recording.   CT Renal Stone Study  Result Date: 11/16/2022 CLINICAL DATA:  Abdominal/flank pain, stone suspected EXAM: CT ABDOMEN AND PELVIS WITHOUT CONTRAST TECHNIQUE: Multidetector CT imaging of the abdomen and pelvis was performed following the standard protocol without IV contrast. RADIATION DOSE REDUCTION: This exam was performed according to the departmental dose-optimization program which includes automated exposure control, adjustment of the mA and/or kV according to patient size and/or use of iterative reconstruction technique. COMPARISON:  CT abdomen pelvis 02/21/2022 FINDINGS: Lower chest: Cardiomegaly. Possible  bilateral trace pleural effusions. Bilateral lobe subsegmental atelectasis. Hepatobiliary: No focal liver abnormality. Contracted gallbladder. No gallstones, gallbladder wall thickening, or pericholecystic fluid. No biliary dilatation. Pancreas: No focal lesion. Normal pancreatic contour. No surrounding inflammatory changes. No main pancreatic ductal dilatation. Spleen: Nonspecific chronic calcification. Normal in size without focal abnormality. Adrenals/Urinary Tract: No adrenal nodule bilaterally. No nephrolithiasis and no hydronephrosis. Calcifications associated with the kidneys likely vascular. No ureterolithiasis or hydroureter. The urinary bladder is unremarkable. Stomach/Bowel: Stomach is within normal limits. No evidence of bowel wall thickening or dilatation. Appendix appears normal. Vascular/Lymphatic: No abdominal aorta or iliac aneurysm. Mild atherosclerotic plaque of the aorta and its branches. No abdominal, pelvic, or inguinal lymphadenopathy. Reproductive: Prostate is enlarged measuring up 4.7 cm. Other: Diffuse mesenteric edema. Trace simple free fluid. No intraperitoneal free gas. No organized fluid collection. Musculoskeletal: Subcutaneus soft tissue edema. Small fluid containing right inguinal hernia. No suspicious lytic or blastic osseous lesions. No acute displaced fracture. IMPRESSION: 1. Small fluid containing right inguinal hernia. 2. Subcutaneus soft tissue edema and mesenteric edema. 3. No acute intra-abdominal or intrapelvic abnormality with limited evaluation on this noncontrast study. 4. Cardiomegaly. 5. Prostatomegaly. 6.  Aortic Atherosclerosis (ICD10-I70.0). Electronically Signed   By: 02/23/2022 M.D.   On: 11/16/2022 21:16   CT Head Wo Contrast  Result Date: 11/16/2022 CLINICAL DATA:  Altered mental status EXAM: CT HEAD WITHOUT CONTRAST TECHNIQUE: Contiguous axial images were obtained from the base of the skull through the vertex without intravenous contrast. RADIATION DOSE  REDUCTION: This exam was performed according to the departmental dose-optimization program which includes automated exposure control, adjustment of  the mA and/or kV according to patient size and/or use of iterative reconstruction technique. COMPARISON:  Brain MRI 05/08/2022 FINDINGS: Brain: There is no acute intracranial hemorrhage, extra-axial fluid collection, or acute infarct. There is mild parenchymal volume loss. The ventricles are stable in size. A remote cortical infarct in the left occipital lobe is unchanged (2-18). Additional patchy hypodensity in the supratentorial white matter likely reflects additional remote infarcts and background chronic small-vessel ischemic change. The pituitary and suprasellar region are normal. There is no mass lesion. There is no mass effect or midline shift. Vascular: There is calcification of the bilateral carotid siphons and vertebral arteries. Skull: Normal. Negative for fracture or focal lesion. Sinuses/Orbits: The imaged paranasal sinuses are clear. The globes and orbits are unremarkable. Other: None. IMPRESSION: No acute intracranial pathology. Electronically Signed   By: Lesia HausenPeter  Noone M.D.   On: 11/16/2022 16:27   DG Chest Port 1 View  Result Date: 11/16/2022 CLINICAL DATA:  Altered mental status EXAM: PORTABLE CHEST 1 VIEW COMPARISON:  11/08/2022 x-ray and older. FINDINGS: Enlarged cardiopericardial silhouette. Tortuous and ectatic aorta. Basilar congestion is increasing from previous. Possible component of edema as well. No pneumothorax or effusion. Overlapping cardiac leads. IMPRESSION: Enlarged cardiopericardial silhouette with increasing vascular congestion and trace edema Electronically Signed   By: Karen KaysAshok  Gupta M.D.   On: 11/16/2022 15:11   CT CHEST WO CONTRAST  Result Date: 11/09/2022 CLINICAL DATA:  Respiratory illness.  Fever and productive cough. EXAM: CT CHEST WITHOUT CONTRAST TECHNIQUE: Multidetector CT imaging of the chest was performed following the  standard protocol without IV contrast. RADIATION DOSE REDUCTION: This exam was performed according to the departmental dose-optimization program which includes automated exposure control, adjustment of the mA and/or kV according to patient size and/or use of iterative reconstruction technique. COMPARISON:  X-ray 11/08/2022.  CT 05/08/2022.  Older exams as well FINDINGS: Cardiovascular: The heart is enlarged. Small pericardial effusion. Normal caliber thoracic aorta with some vascular calcifications including along the coronary arteries. Please correlate with other coronary risk factors. There is some enlargement of the main pulmonary arteries. Please correlate for pulmonary artery hypertension. Mediastinum/Nodes: Slightly patulous thoracic esophagus. There may be some debris along the upper thoracic esophagus as well. On this non IV contrast exam there is no specific abnormal lymph node enlargement seen in the axillary region, hilum or mediastinum. A few small less than 1 cm in size nodes are seen, not pathologic by size criteria. Lungs/Pleura: Small right and tiny left pleural effusion identified. Extensive breathing motion throughout the examination. Diffuse bilateral perihilar lung opacities are identified, slightly right greater than left. Infiltrate versus edema is possible. There is some interstitial septal thickening. No pneumothorax. The ill-defined lung opacities are new from prior. Upper Abdomen: The adrenal glands are incompletely included in the imaging field. There is some air noted overlying the central left hepatic lobe. This could represent air in the biliary tree but indeterminate on this examination. Please correlate with history. Musculoskeletal: Mild degenerative changes along the spine. Anasarca. Focal lipoma anterior to the sternum on image 59 of series 2. IMPRESSION: Small right and tiny left pleural effusion.  Mild adjacent opacity. Diffuse perihilar hazy ground-glass opacities with  interstitial septal thickening. The heart is also enlarged. Please correlate for evidence of edema. Recommend follow-up. Anasarca. Slightly patulous esophagus with some debris in the upper thoracic esophagus. Please correlate with symptoms. Area of air noted within the liver on the left side. This could be within the biliary tree but indeterminate. Please correlate for  any history and dedicated evaluation when appropriate such as abdominal imaging. Preferably with contrast. Patient does have provided history of possible sepsis. Coronary artery calcifications. Please correlate for other coronary risk factors. Aortic Atherosclerosis (ICD10-I70.0). Electronically Signed   By: Karen Kays M.D.   On: 11/09/2022 12:09   ECHOCARDIOGRAM COMPLETE  Result Date: 11/09/2022    ECHOCARDIOGRAM REPORT   Patient Name:   YSABEL COWGILL Date of Exam: 11/09/2022 Medical Rec #:  [id number]        Height:       66.0 in Accession #:    [id number]       Weight:       164.4 lb Date of Birth:  [birth year]        BSA:          1.840 m Patient Age:    66 years         BP:           147/79 mmHg Patient Gender: M                HR:           73 bpm. Exam Location:  Inpatient Procedure: 2D Echo Indications:    CHF  History:        Patient has prior history of Echocardiogram examinations, most                 recent 05/10/2022. Stroke; Risk Factors:Dyslipidemia and                 Diabetes.  Sonographer:    Cathie Hoops Referring Phys: [id number] DAVID MANUEL ORTIZ IMPRESSIONS  1. Left ventricular ejection fraction, by estimation, is 60 to 65%. The left ventricle has normal function. The left ventricle has no regional wall motion abnormalities. There is severe concentric left ventricular hypertrophy. Left ventricular diastolic  parameters are consistent with Grade II diastolic dysfunction (pseudonormalization).  2. Right ventricular systolic function is normal. The right ventricular size is normal. The estimated right ventricular systolic  pressure is 50.0 mmHg.  3. Left atrial size was mild to moderately dilated.  4. The mitral valve is grossly normal. Mild mitral valve regurgitation.  5. The aortic valve is tricuspid. There is moderate calcification of the aortic valve. There is severe thickening of the aortic valve. Aortic valve regurgitation is mild to moderate. Aortic valve sclerosis/calcification is present, without any evidence of aortic stenosis. Aortic regurgitation PHT measures 308 msec. Aortic valve area, by VTI measures 2.07 cm. Aortic valve mean gradient measures 7.3 mmHg. Aortic valve Vmax measures 1.88 m/s.  6. The inferior vena cava is dilated in size with <50% respiratory variability, suggesting right atrial pressure of 15 mmHg. Comparison(s): Compared to prior TTE on 04/2022, the filling pressures are elevated on current study. FINDINGS  Left Ventricle: Left ventricular ejection fraction, by estimation, is 60 to 65%. The left ventricle has normal function. The left ventricle has no regional wall motion abnormalities. The left ventricular internal cavity size was normal in size. There is  severe concentric left ventricular hypertrophy. Left ventricular diastolic parameters are consistent with Grade II diastolic dysfunction (pseudonormalization). Right Ventricle: The right ventricular size is normal. No increase in right ventricular wall thickness. Right ventricular systolic function is normal. The tricuspid regurgitant velocity is 2.96 m/s, and with an assumed right atrial pressure of 15 mmHg, the estimated right ventricular systolic pressure is 50.0 mmHg. Left Atrium: Left atrial size was mild to moderately dilated. Right Atrium: Right atrial size  was normal in size. Pericardium: There is no evidence of pericardial effusion. Mitral Valve: The mitral valve is grossly normal. There is mild thickening of the mitral valve leaflet(s). There is mild calcification of the mitral valve leaflet(s). Mild to moderate mitral annular  calcification. Mild mitral valve regurgitation. Tricuspid Valve: The tricuspid valve is normal in structure. Tricuspid valve regurgitation is mild. Aortic Valve: The aortic valve is tricuspid. There is moderate calcification of the aortic valve. There is severe thickening of the aortic valve. Aortic valve regurgitation is mild to moderate. Aortic regurgitation PHT measures 308 msec. Aortic valve sclerosis/calcification is present, without any evidence of aortic stenosis. Aortic valve mean gradient measures 7.3 mmHg. Aortic valve peak gradient measures 14.1 mmHg. Aortic valve area, by VTI measures 2.07 cm. Pulmonic Valve: The pulmonic valve was normal in structure. Pulmonic valve regurgitation is trivial. Aorta: The aortic root is normal in size and structure. Venous: The inferior vena cava is dilated in size with less than 50% respiratory variability, suggesting right atrial pressure of 15 mmHg. IAS/Shunts: The atrial septum is grossly normal.  LEFT VENTRICLE PLAX 2D LVIDd:         5.00 cm      Diastology LVIDs:         3.70 cm      LV e' medial:    5.98 cm/s LV PW:         1.50 cm      LV E/e' medial:  17.2 LV IVS:        1.40 cm      LV e' lateral:   7.40 cm/s LVOT diam:     1.90 cm      LV E/e' lateral: 13.9 LV SV:         74 LV SV Index:   40 LVOT Area:     2.84 cm  LV Volumes (MOD) LV vol d, MOD A2C: 137.0 ml LV vol d, MOD A4C: 171.0 ml LV vol s, MOD A2C: 66.4 ml LV vol s, MOD A4C: 82.4 ml LV SV MOD A2C:     70.6 ml LV SV MOD A4C:     171.0 ml LV SV MOD BP:      76.1 ml RIGHT VENTRICLE RV Basal diam:  4.00 cm RV Mid diam:    3.10 cm RV S prime:     11.90 cm/s TAPSE (M-mode): 1.7 cm LEFT ATRIUM           Index        RIGHT ATRIUM           Index LA diam:      3.60 cm 1.96 cm/m   RA Area:     13.30 cm LA Vol (A2C): 61.7 ml 33.53 ml/m  RA Volume:   27.50 ml  14.95 ml/m LA Vol (A4C): 63.5 ml 34.51 ml/m  AORTIC VALVE                     PULMONIC VALVE AV Area (Vmax):    2.01 cm      PV Vmax:          1.30  m/s AV Area (Vmean):   1.94 cm      PV Peak grad:     6.8 mmHg AV Area (VTI):     2.07 cm      PR End Diast Vel: 4.24 msec AV Vmax:           188.00 cm/s AV Vmean:  126.667 cm/s AV VTI:            0.357 m AV Peak Grad:      14.1 mmHg AV Mean Grad:      7.3 mmHg LVOT Vmax:         133.00 cm/s LVOT Vmean:        86.500 cm/s LVOT VTI:          0.261 m LVOT/AV VTI ratio: 0.73 AI PHT:            308 msec  AORTA Ao Root diam: 3.50 cm Ao Asc diam:  2.80 cm MITRAL VALVE                TRICUSPID VALVE MV Area (PHT): 4.39 cm     TR Peak grad:   35.0 mmHg MV Decel Time: 173 msec     TR Vmax:        296.00 cm/s MR Peak grad: 61.6 mmHg MR Vmax:      392.33 cm/s   SHUNTS MV E velocity: 103.00 cm/s  Systemic VTI:  0.26 m MV A velocity: 76.30 cm/s   Systemic Diam: 1.90 cm MV E/A ratio:  1.35 Gwyndolyn Kaufman MD Electronically signed by Gwyndolyn Kaufman MD Signature Date/Time: 11/09/2022/9:38:14 AM    Final    DG Chest Port 1 View  Result Date: 11/08/2022 CLINICAL DATA:  Shortness of breath and weakness EXAM: PORTABLE CHEST 1 VIEW COMPARISON:  X-ray 05/07/2022.  CT 05/08/2022.  Older exams as well. FINDINGS: Enlarged cardiopericardial silhouette with vascular congestion mild opacity. No pneumothorax, effusion. Overlapping cardiac leads. IMPRESSION: Enlarged cardiopericardial silhouette with diffuse vascular congestion. Electronically Signed   By: Jill Side M.D.   On: 11/08/2022 10:30    Microbiology: Results for orders placed or performed during the hospital encounter of 11/16/22  Blood culture (routine x 2)     Status: None   Collection Time: 11/16/22  2:51 PM   Specimen: BLOOD RIGHT HAND  Result Value Ref Range Status   Specimen Description   Final    BLOOD RIGHT HAND Performed at [facility] [address], [place], [place] [address]    Special Requests   Final    BOTTLES DRAWN AEROBIC AND ANAEROBIC Blood Culture adequate volume Performed at [facility] [address], [place], [place] [address]    Culture   Final    NO GROWTH 5 DAYS Performed at [facility], [place] [address] [place], [place] [address]    Report Status 11/21/2022 FINAL  Final  Blood culture (routine x 2)     Status: None (Preliminary result)   Collection Time: 11/16/22  6:00 PM   Specimen: BLOOD  Result Value Ref Range Status   Specimen Description   Final    BLOOD BLOOD LEFT HAND Performed at [facility] [address], [place], [place] [address]    Special Requests   Final    BOTTLES DRAWN AEROBIC AND ANAEROBIC Blood Culture adequate volume Performed at [facility] [address] [place], [place] [address]    Culture   Final    NO GROWTH 4 DAYS Performed at [facility], [place] [address] [place], [place] [address]    Report Status PENDING  Incomplete  Resp panel by RT-PCR (RSV, Flu A&B, Covid) Anterior Nasal Swab     Status: None   Collection Time: 11/16/22  7:01 PM   Specimen: Anterior Nasal Swab  Result  Value Ref Range Status   SARS Coronavirus 2 by RT PCR NEGATIVE NEGATIVE Final    Comment: (NOTE) SARS-CoV-2 target nucleic acids are NOT DETECTED.  The SARS-CoV-2 RNA is generally detectable in upper respiratory specimens during the acute phase of infection. The lowest concentration of SARS-CoV-2 viral copies this assay can detect is 138 copies/mL. A negative result does not preclude SARS-Cov-2 infection and should not be used as the sole basis for treatment or other patient management decisions. A negative result may occur with  improper specimen collection/handling, submission of specimen other than nasopharyngeal swab, presence of viral mutation(s) within the areas targeted by this assay, and inadequate number of viral copies(<138 copies/mL). A negative result must be combined with clinical observations, patient history, and epidemiological information. The expected result is Negative.  Fact  Sheet for Patients:  BloggerCourse.com  Fact Sheet for Healthcare Providers:  SeriousBroker.it  This test is no t yet approved or cleared by the [place] FDA and  has been authorized for detection and/or diagnosis of SARS-CoV-2 by FDA under an Emergency Use Authorization (EUA). This EUA will remain  in effect (meaning this test can be used) for the duration of the COVID-19 declaration under Section 564(b)(1) of the Act, 21 U.S.C.section 360bbb-3(b)(1), unless the authorization is terminated  or revoked sooner.       Influenza A by PCR NEGATIVE NEGATIVE Final   Influenza B by PCR NEGATIVE NEGATIVE Final    Comment: (NOTE) The Xpert Xpress SARS-CoV-2/FLU/RSV plus assay is intended as an aid in the diagnosis of influenza from Nasopharyngeal swab specimens and should not be used as a sole basis for treatment. Nasal washings and aspirates are unacceptable for Xpert Xpress SARS-CoV-2/FLU/RSV testing.  Fact Sheet for Patients: BloggerCourse.com  Fact Sheet for Healthcare Providers: SeriousBroker.it  This test is not yet approved or cleared by the [place] FDA and has been authorized for detection and/or diagnosis of SARS-CoV-2 by FDA under an Emergency Use Authorization (EUA). This EUA will remain in effect (meaning this test can be used) for the duration of the COVID-19 declaration under Section 564(b)(1) of the Act, 21 U.S.C. section 360bbb-3(b)(1), unless the authorization is terminated or revoked.     Resp Syncytial Virus by PCR NEGATIVE NEGATIVE Final    Comment: (NOTE) Fact Sheet for Patients: BloggerCourse.com  Fact Sheet for Healthcare Providers: SeriousBroker.it  This test is not yet approved or cleared by the [place] FDA and has been authorized for detection and/or diagnosis of SARS-CoV-2 by FDA under an  Emergency Use Authorization (EUA). This EUA will remain in effect (meaning this test can be used) for the duration of the COVID-19 declaration under Section 564(b)(1) of the Act, 21 U.S.C. section 360bbb-3(b)(1), unless the authorization is terminated or revoked.  Performed at [facility], [address] [place], [place] [address]   Urine Culture     Status: None   Collection Time: 11/16/22  7:20 PM   Specimen: Urine, Clean Catch  Result Value Ref Range Status   Specimen Description   Final    URINE, CLEAN CATCH Performed at [facility], [address], [place], [place] [address]    Special Requests   Final    NONE Performed at [facility], [address] [place], [place] [address]    Culture   Final    NO GROWTH Performed at [facility], [address], [place], [place] [address]    Report Status 11/17/2022 FINAL  Final  MRSA Next Gen by PCR, Nasal     Status: None   Collection Time: 11/17/22  6:41 PM   Specimen: Nasal Mucosa; Nasal Swab  Result Value Ref Range Status   MRSA by PCR Next Gen NOT DETECTED NOT DETECTED Final    Comment: (NOTE) The GeneXpert MRSA Assay (FDA approved for NASAL specimens only), is one component of a comprehensive MRSA colonization surveillance program. It is not intended to diagnose MRSA infection nor to guide or monitor treatment for MRSA infections. Test performance is not FDA approved in patients less than 66 years old. Performed at [facility], [address] [place], [place] [id number]    Labs: CBC: Recent Labs  Lab 11/16/22 1459 11/17/22 1038 11/17/22 1949 11/18/22 0550 11/19/22 0729 11/20/22 0540 11/21/22 0518  WBC 12.9* 11.7*  --  13.0* 13.6* 12.8* 12.7*  NEUTROABS 9.9*  --   --   --   --   --   --   HGB 7.4* 6.7* 7.9* 7.7* 8.0* 7.9* 7.5*  HCT 23.5* 21.5* 24.8* 24.9* 24.8* 24.3* 22.7*  MCV 96.3 96.4  --  96.5 92.9 92.4 91.9  PLT 279 277   --  286 259 262 231   Basic Metabolic Panel: Recent Labs  Lab 11/17/22 0032 11/18/22 0550 11/19/22 0729 11/20/22 0540 11/21/22 0518  NA 138 140 138 135 133*  K 4.1 4.2 3.9 3.8 4.1  CL 108 107 104 102 101  CO2 22 25 28 27 24   GLUCOSE 199* 193* 214* 172* 150*  BUN 50* 48* 40* 42* 39*  CREATININE 3.14* 2.76* 2.98* 2.97* 2.74*  CALCIUM 7.7* 8.2* 8.0* 8.0* 7.8*  MG  --   --  1.9 1.9 1.6*   Liver Function Tests: Recent Labs  Lab 11/16/22 1459 11/17/22 0032  AST 16 16  ALT 26 22  ALKPHOS 83 74  BILITOT 0.5 0.2*  PROT 5.9* 5.3*  ALBUMIN 2.3* 2.0*   CBG: Recent Labs  Lab 11/19/22 2055 11/20/22 0720 11/20/22 1151 11/20/22 1703 11/20/22 2059  GLUCAP 222* 162* 169* 211* 206*    Discharge time spent: greater than 30 minutes.  Signed: Lynden OxfordPranav Zebulen Simonis, MD [facility]

## 2022-11-21 NOTE — TOC Progression Note (Addendum)
Transition of Care [facility]) - Progression Note    Patient Details  Name: Manuel Kramer MRN: [id number] Date of Birth: 04/29/1956  [facility] CM/SW Contact  Leeroy Cha, RN Phone Number: 11/21/2022, 10:42 AM  Clinical Narrative:    Leticia Penna at [facility] yes patient can return.  New fl2 not needed. Medicare primary no auth needs long term patient. Tct-Daughter Carmina Miller, will call [facility] and arrange to sign papers. Dc summary and snf transfer report faxed to [facility] via the hub. [facility]tar called for transport at [phone] for transport at 2 pm  Transport packet to the unit clerk      Expected Discharge Plan and [place]) Interventions [place]: Low Risk  (11/13/2022)  Tobacco Use: High Risk (11/17/2022)    Readmission Risk Interventions   No data to display

## 2022-11-21 NOTE — Progress Notes (Signed)
Patient removed his IV out. Refused to be stuck for IV tonight per IV team. Patient requested to be done in AM.

## 2022-11-22 LAB — CULTURE, BLOOD (ROUTINE X 2)
Culture: NO GROWTH
Special Requests: ADEQUATE

## 2022-11-23 LAB — VITAMIN B1: Vitamin B1 (Thiamine): 111.5 nmol/L (ref 66.5–200.0)

## 2022-11-27 ENCOUNTER — Other Ambulatory Visit: Payer: Self-pay

## 2022-11-28 ENCOUNTER — Encounter: Payer: Self-pay | Admitting: Family Medicine

## 2022-11-28 ENCOUNTER — Non-Acute Institutional Stay: Payer: Medicare Other | Admitting: Family Medicine

## 2022-11-28 VITALS — BP 142/78 | HR 69 | Temp 97.4°F | Resp 18 | Wt 149.0 lb

## 2022-11-28 DIAGNOSIS — R1310 Dysphagia, unspecified: Secondary | ICD-10-CM

## 2022-11-28 DIAGNOSIS — N184 Chronic kidney disease, stage 4 (severe): Secondary | ICD-10-CM

## 2022-11-28 DIAGNOSIS — Z794 Long term (current) use of insulin: Secondary | ICD-10-CM

## 2022-11-28 DIAGNOSIS — Z515 Encounter for palliative care: Secondary | ICD-10-CM

## 2022-11-28 DIAGNOSIS — E43 Unspecified severe protein-calorie malnutrition: Secondary | ICD-10-CM

## 2022-11-28 NOTE — Progress Notes (Unsigned)
[facility] Pall[facility] Care Consult Note Telephone: [phone]  Fax: [phone]   Date of encounter: 11/28/22 5:50 PM PATIENT NAME: Manuel Kramer [id number] [place] [address]   [phone] (home)  DOB: 1956-07-03 MRN: [id number] PRIMARY CARE PROVIDER:    Vonna Drafts, FNP,  [place] [address] [phone]  REFERRING PROVIDER:   Vonna Drafts, [address] [place],  [place] [address] [phone]  RESPONSIBLE PARTY:    Contact Information     Name Relation Home Work [place] Daughter   [phone]   Kemar, Pandit   [phone]        I met face to face with patient in [place]. Palliative Care was asked to follow this patient by consultation request of  Vonna Drafts, FNP to address advance care planning and complex medical decision making. This is the initial visit.          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 No hypoglycemic episodes. Recommend for now to hold regular insulin coverage. Recommend re-starting Lantus 10 units QHS, continue monitoring blood sugar ACHS and re-evaluate trends in 3 days of intake and blood sugars. Goal is blood sugar 120-180. Would start to add back Novolog 2 units before meal starting with breakfast then adding before lunch then dinner if daily blood sugar not at goal with no insulin coverage at HS.   Unspecified dysphagia Likely multifactorial related to patulous esophagus and tertiary contractions, cognitive deficits and edentulous state. Recommend ST to evaluate swallow and MMSE. Agree with pureed diet with nectar thick liquid and do not recommend a straw. Upright and alert for all intake and for 30-45 min  afterwards. Recommend use of low carb, high protein supplement at least twice daily.   CKD stage IV Recommend repeat BMP, phos and mag now with repletion as indicated per results. Depending on goals of care, would recommend nephrology evaluation to maximize remaining function and improve BP control. 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   Protein Calorie malnutrition Last albumin 2.0 with CT showing anasarca and poor intake. Low carb, high protein house supplement of choice BID.  5.    Palliative Care Encounter Palliative to discuss current poor nutrition, DM control, hypertension and cognition to poor prognosis and address goals of care with health care agent to determine degree of aggressive intervention desired.  Believe that pt may be a candidate for hospice services at the facility.  Follow up Palliative Care Visit: Palliative care will continue to follow for complex medical decision making, advance care planning, and clarification of goals. Return 2-3 weeks or prn.    This visit was coded based on medical decision making (MDM).  PPS: 40%  HOSPICE ELIGIBILITY/DIAGNOSIS: TBD  Chief Complaint:  [facility] received a referral to follow up with patient for chronic disease management in setting or prior CVA with residual deficits.  Palliative Care is also following to assist with advance directive planning and defining/refining goals of care.  HISTORY OF PRESENT ILLNESS:  Manuel Kramer is a 67 y.o. 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 Facility nurse indicates that pt is edentulous and tends to chew even mechanical soft diet for 10-15 minutes before swallowing a bite of food and he has not been eating more than a few bites and drinking minimally for the last week or so.  Pt has indicated to her previously that he has trouble swallowing. On discussion with the patient he says that he does not have difficult with soft pureed consistency foods like mashed potatoes, applesauce or pudding. Nurse had previously altered his diet to pureed with nectar thick liquids but someone changed it back to mechanical soft. CT chest showed patulous esophagus.  He underwent a barium swallow on 11/20/22. He is an unreliable historian as he does not appear oriented to time or situation, oriented to his name.  He was noted on approach to have pulled his foley anchor from his skin and kept fiddling with the clip stating he needed to "turn it on". He states he has to go to the bathroom. Reassured pt that catheter is in, encouraged not to manipulate or pull on the tube and attempted to redirect pt.  He denies pain however he demonstrates facial grimace and wincing intermittently.  Previously pt was on Lantus 12 units QHS and this was d/c'd on 11/16/22 and hospital noted hypoglycemia in 49- 60s either at fasting or bedtime on this regimen at times with highs in the 200-300 range.  Current regimen is for Novolog 3 units with each meal if he eats more than 50%. Blood sugars in the last week 250-310 without low blood sugars. Appears baseline Cr 2.0-2.1 as of last July with eGFR 33-36 .  Earlier this month Cr peaked at 4.03 on 11/10/22 with hypocalcemia and hypomagnesemia and eGFR 16-17.  At D/C Cr improved to 2.74 with eGFR 25,  History obtained from review of EMR, discussion with primary team, and interview with family, facility staff/caregiver and/or Mr. Shaddix.      Latest Ref Rng & Units  11/21/2022    5:18 AM 11/20/2022    5:40 AM 11/19/2022    7:29 AM  CBC  WBC 4.0 - 10.5 K/uL 12.7  12.8  13.6   Hemoglobin 13.0 - 17.0 g/dL 7.5  7.9  8.0   Hematocrit 39.0 - 52.0 % 22.7  24.3  24.8   Platelets 150 - 400 K/uL 231  262  259       Latest Ref Rng & Units 11/21/2022    5:18 AM 11/20/2022    5:40 AM 11/19/2022    7:29 AM  CMP  Glucose 70 - 99 mg/dL 944  967  591   BUN 8 - 23 mg/dL 39  42  40   Creatinine 0.61 - 1.24 mg/dL 6.38  4.66  5.99   Sodium 135 - 145 mmol/L 133  135  138   Potassium 3.5 - 5.1 mmol/L 4.1  3.8  3.9   Chloride 98 - 111 mmol/L 101  102  104   CO2 22 - 32 mmol/L 24  27  28    Calcium 8.9 - 10.3 mg/dL 7.8  8.0  8.0        Latest Ref Rng & Units 11/17/2022   12:32 AM 11/16/2022    2:59 PM 11/09/2022  5:34 AM  Hepatic Function  Total Protein 6.5 - 8.1 g/dL 5.3  5.9  5.2   Albumin 3.5 - 5.0 g/dL 2.0  2.3  2.1   AST 15 - 41 U/L 16  16  28    ALT 0 - 44 U/L 22  26  30    Alk Phosphatase 38 - 126 U/L 74  83  59   Total Bilirubin 0.3 - 1.2 mg/dL 0.2  0.5  0.3     11/08/22 HGB A1c 6.0%  11/20/22 Barium swallow: IMPRESSION: Single contrast esophagram significant for diffusely patulous esophagus with moderate dysmotility with tertiary contractions. No stricture, mass or hiatal hernia seen on limited exam.  Swallowing: Appears normal. No vestibular penetration or aspiration seen.  11/16/22 Respiratory Virus panel negative for Influenza A & B, SARS-CoV-2 and RSV.  11/09/2022 2d Echo:  1. Left ventricular EF 60 to 65%. LV normal function and no regional wall motion abnormalities. Severe left ventricular hypertrophy.  Grade II diastolic dysfunction.   2. Right ventricular systolic function and size are normal. Estimated RVSP is 50 mm Hg.    3.  Left atrium mild to moderately dilated . 4. Mitral valve grossly normal with mild mitral valve without regurgitation.    4. The aortic valve is calcified, leaflets severely thickened. Aortic valve regurgitation is mild to  moderate without stenosis.  5. The inferior vena cava is dilated in size with less than 50% respiratory variability, suggesting right atrial pressure of 15 mmHg.   11/09/22 CT Chest wo contrast:  Narrative & Impression  CLINICAL DATA:  Respiratory illness.  Fever and productive cough.   EXAM: CT CHEST WITHOUT CONTRAST   TECHNIQUE: Multidetector CT imaging of the chest was performed following the standard protocol without IV contrast.   RADIATION DOSE REDUCTION: This exam was performed according to the departmental dose-optimization program which includes automated exposure control, adjustment of the mA and/or kV according to patient size and/or use of iterative reconstruction technique.   COMPARISON:  X-ray 11/08/2022.  CT 05/08/2022.  Older exams as well   FINDINGS: Cardiovascular: The heart is enlarged. Small pericardial effusion. Normal caliber thoracic aorta with some vascular calcifications including along the coronary arteries. Please correlate with other coronary risk factors. There is some enlargement of the main pulmonary arteries. Please correlate for pulmonary artery hypertension.   Mediastinum/Nodes: Slightly patulous thoracic esophagus. There may be some debris along the upper thoracic esophagus as well. On this non IV contrast exam there is no specific abnormal lymph node enlargement seen in the axillary region, hilum or mediastinum. A few small less than 1 cm in size nodes are seen, not pathologic by size criteria.   Lungs/Pleura: Small right and tiny left pleural effusion identified. Extensive breathing motion throughout the examination. Diffuse bilateral perihilar lung opacities are identified, slightly right greater than left. Infiltrate versus edema is possible. There is some interstitial septal thickening. No pneumothorax. The ill-defined lung opacities are new from prior.   Upper Abdomen: The adrenal glands are incompletely included in the imaging  field. There is some air noted overlying the central left hepatic lobe. This could represent air in the biliary tree but indeterminate on this examination. Please correlate with history.   Musculoskeletal: Mild degenerative changes along the spine. Anasarca. Focal lipoma anterior to the sternum on image 59 of series 2.   IMPRESSION: Small right and tiny left pleural effusion.  Mild adjacent opacity.   Diffuse perihilar hazy ground-glass opacities with interstitial septal thickening. The heart is also  enlarged. Please correlate for evidence of edema. Recommend follow-up.   Anasarca.   Slightly patulous esophagus with some debris in the upper thoracic esophagus. Please correlate with symptoms.   Area of air noted within the liver on the left side. This could be within the biliary tree but indeterminate. Please correlate for any history and dedicated evaluation when appropriate such as abdominal imaging. Preferably with contrast. Patient does have provided history of possible sepsis.   Coronary artery calcifications. Please correlate for other coronary     11/16/22 Portable CXR: IMPRESSION: Enlarged cardiopericardial silhouette with increasing vascular congestion and trace edema  11/16/22 CT head wo contrast: IMPRESSION: No acute intracranial pathology.  11/16/22 CT renal stone study: IMPRESSION: 1. Small fluid containing right inguinal hernia. 2. Subcutaneus soft tissue edema and mesenteric edema. 3. No acute intra-abdominal or intrapelvic abnormality with limited evaluation on this noncontrast study. 4. Cardiomegaly. 5. Prostatomegaly. 6.  Aortic Atherosclerosis (ICD10-I70.0).   I reviewed EMR for available labs, medications, imaging, studies and related documents.  No new records since last visit/Records reviewed and summarized above.   ROS General: NAD EYES: denies vision changes ENMT: denies dysphagia Cardiovascular: denies chest pain, denies DOE Pulmonary: denies  cough, denies increased SOB Abdomen: endorses good appetite, denies constipation, endorses continence of bowel GU: denies dysuria, endorses continence of urine MSK:  denies increased weakness, no falls reported Skin: denies rashes or wounds Neurological: denies pain, denies insomnia Psych: Endorses positive mood Heme/lymph/immuno: denies bruises, abnormal bleeding  Physical Exam: Current and past weights: 149 lbs as of 11/28/22, weight 134.64 as of 05/08/22 hospitalization Constitutional: NAD General: WNWD EYES: anicteric sclera, lids intact ENMT: intact hearing, oral mucous membranes moist, edentulous CV: S1S2, RRR, no LE edema Pulmonary: CTAB, no increased work of breathing, no cough, room air Abdomen: intake 100%, normo-active BS + 4 quadrants, soft and non tender, no ascites GU: deferred MSK: no sarcopenia, moves all extremities, ambulatory Skin: warm and dry, no rashes or wounds on visible skin Neuro:  no generalized weakness, no cognitive impairment Psych: non-anxious affect, A and O x 3 Hem/lymph/immuno: no widespread bruising  CURRENT PROBLEM LIST:  Patient Active Problem List   Diagnosis Date Noted   UTI (urinary tract infection) 11/16/2022   Sepsis due to undetermined organism (HCC) 11/08/2022   Acute on chronic diastolic congestive heart failure (HCC) 11/08/2022   Hypoglycemia 11/08/2022   Essential hypertension 11/08/2022   History of CVA (cerebrovascular accident) 11/08/2022   Acute ischemic stroke (HCC) > small acute to subacute pontine infarct seen on MRI 05/10/2022   Seizure disorder (HCC) 05/09/2022   Acute metabolic encephalopathy 05/08/2022   Pressure injury of skin 05/08/2022   Aspiration pneumonia (HCC) 05/08/2022   C. difficile colitis 01/26/2022   Acute renal failure superimposed on stage 3b chronic kidney disease (HCC) 01/25/2022   Bilateral lower extremity edema 01/25/2022   Type 2 diabetes mellitus with complication, with long-term current use of  insulin (HCC) 01/25/2022   Depression with anxiety 01/25/2022   Physical deconditioning 01/25/2022   Left hip pain 01/25/2022   Anemia of chronic disease 01/25/2022   Chronic diastolic CHF (congestive heart failure) (HCC) 01/25/2022   Cerebral embolism with cerebral infarction 11/17/2021   Protein-calorie malnutrition, severe 11/16/2021   AKI (acute kidney injury) (HCC)    Hyperglycemia    Hypertensive emergency without congestive heart failure    Sepsis (HCC)    Hyperlipidemia 05/22/2016   Diabetic neuropathy (HCC) 05/22/2016   Other specified diabetes mellitus without complications (HCC) 10/02/2014  Hypertensive emergency 10/02/2014   Smoking 10/02/2014   Family history of prostate cancer 10/02/2014   Renal insufficiency 10/02/2014   PAST MEDICAL HISTORY:  Active Ambulatory Problems    Diagnosis Date Noted   Other specified diabetes mellitus without complications ([place]) 98/33/8250   Hypertensive emergency 10/02/2014   Smoking 10/02/2014   Family history of prostate cancer 10/02/2014   Renal insufficiency 10/02/2014   Hyperlipidemia 05/22/2016   Diabetic neuropathy ([place]) 05/22/2016   AKI (acute kidney injury) ([place])    Hyperglycemia    Hypertensive emergency without congestive heart failure    Sepsis ([place])    Protein-calorie malnutrition, severe 11/16/2021   Cerebral embolism with cerebral infarction 11/17/2021   Acute renal failure superimposed on stage 3b chronic kidney disease ([place]) 01/25/2022   Bilateral lower extremity edema 01/25/2022   Type 2 diabetes mellitus with complication, with long-term current use of insulin ([place]) 01/25/2022   Depression with anxiety 01/25/2022   Physical deconditioning 01/25/2022   Left hip pain 01/25/2022   Anemia of chronic disease 01/25/2022   Chronic diastolic CHF (congestive heart failure) ([place]) 01/25/2022   C. difficile colitis 53/97/6734   Acute metabolic encephalopathy 19/37/9024   Pressure injury of skin 05/08/2022   Aspiration  pneumonia ([place]) 05/08/2022   Seizure disorder ([place]) 05/09/2022   Acute ischemic stroke (HCC) > small acute to subacute pontine infarct seen on MRI 05/10/2022   Sepsis due to undetermined organism ([place]) 11/08/2022   Acute on chronic diastolic congestive heart failure ([place]) 11/08/2022   Hypoglycemia 11/08/2022   Essential hypertension 11/08/2022   History of CVA (cerebrovascular accident) 11/08/2022   UTI (urinary tract infection) 11/16/2022   Resolved Ambulatory Problems    Diagnosis Date Noted   Acute encephalopathy 11/14/2021   Hypokalemia 01/26/2022   Acute respiratory failure with hypoxemia ([place]) 05/08/2022   Hyperglycemic hyperosmolar nonketotic coma ([place]) 05/08/2022   Past Medical History:  Diagnosis Date   Chronic diarrhea    Diabetes mellitus without complication ([place])    Hypertension    SOCIAL HX:  Social History   Tobacco Use   Smoking status: Some Days    Packs/day: 0.50    Years: 40.00    Total pack years: 20.00    Types: Cigarettes   Smokeless tobacco: Former  Substance Use Topics   Alcohol use: Not Currently    Alcohol/week: 1.0 standard drink of alcohol    Types: 1 Cans of beer per week    Comment: Patient has not drunk in 3 weeks   FAMILY HX:  Family History  Problem Relation Age of Onset   Cancer Mother    Diabetes Mother    Hypertension Mother    Cancer Father        Preferred Pharmacy: ALLERGIES:  Allergies  Allergen Reactions   Codeine Other (See Comments)    On MAR, unknown reaction   Iodinated Contrast Media Other (See Comments)    On MAR, unknown reaction   Pentazocine Other (See Comments)    On MAR, unknown reaction   Statins Other (See Comments)    On MAR, unknown reaction     PERTINENT MEDICATIONS:  Outpatient Encounter Medications as of 11/28/2022  Medication Sig   acetaminophen (TYLENOL) 500 MG tablet Take 1 tablet (500 mg total) by mouth every 6 (six) hours as needed. (Patient taking differently: Take 500 mg by mouth every 6  (six) hours as needed for moderate pain.)   acidophilus (RISAQUAD) CAPS capsule Take 2 capsules by mouth daily.   amLODipine (NORVASC) 10  MG tablet Take 1 tablet (10 mg total) by mouth daily.   aspirin EC 81 MG tablet Take 81 mg by mouth daily. Swallow whole.   atorvastatin (LIPITOR) 40 MG tablet Take 1 tablet (40 mg total) by mouth daily.   carvedilol (COREG) 25 MG tablet Take 1 tablet (25 mg total) by mouth 2 (two) times daily with a meal.   cholecalciferol (VITAMIN D3) 25 MCG (1000 UNIT) tablet Take 2,000 Units by mouth daily.   Cinnamon 500 MG capsule Take 500 mg by mouth daily.   cloNIDine (CATAPRES) 0.3 MG tablet Take 1 tablet (0.3 mg total) by mouth 3 (three) times daily.   clopidogrel (PLAVIX) 75 MG tablet Take 75 mg by mouth daily.   doxazosin (CARDURA) 2 MG tablet Take 1 tablet (2 mg total) by mouth daily.   ferrous sulfate 325 (65 FE) MG tablet Take 1 tablet (325 mg total) by mouth daily with breakfast.   hydrALAZINE (APRESOLINE) 100 MG tablet Take 1 tablet (100 mg total) by mouth 3 (three) times daily.   insulin aspart (NOVOLOG) 100 UNIT/ML injection Inject 3 Units into the skin 3 (three) times daily with meals. If eats more than 50% of meals (Patient taking differently: Inject 5 Units into the skin 3 (three) times daily with meals.)   ipratropium (ATROVENT) 0.06 % nasal spray Place 2 sprays into both nostrils 2 (two) times daily.   isosorbide dinitrate (ISORDIL) 40 MG tablet Take 40 mg by mouth 3 (three) times daily.   levETIRAcetam (KEPPRA) 500 MG tablet Take 1 tablet (500 mg total) by mouth 2 (two) times daily.   loratadine (CLARITIN) 10 MG tablet Take 10 mg by mouth every other day.   magnesium oxide (MAG-OX) 400 MG tablet Take 1 tablet (400 mg total) by mouth daily.   modafinil (PROVIGIL) 100 MG tablet Take 1 tablet (100 mg total) by mouth daily.   Multiple Vitamin (MULTIVITAMIN WITH MINERALS) TABS tablet Take 1 tablet by mouth daily.   niacin 100 MG tablet Take 100 mg by mouth  2 (two) times daily.   Nutritional Supplements (FEEDING SUPPLEMENT, NEPRO CARB STEADY,) LIQD Take 237 mLs by mouth 2 (two) times daily between meals.   sertraline (ZOLOFT) 50 MG tablet Take 50 mg by mouth daily.   sodium bicarbonate 650 MG tablet Take 1,300 mg by mouth 2 (two) times daily.   spironolactone (ALDACTONE) 25 MG tablet Take 1 tablet (25 mg total) by mouth daily.   Torsemide 40 MG TABS Take 40 mg by mouth daily.   No facility-administered encounter medications on file as of 11/28/2022.     -------------------------------------------------------------------- Advance Care Planning/Goals of Care:  Identification  of a healthcare agent  CODE STATUS: Currently full code     Thank you for the opportunity to participate in the care of Mr. Crysler.  The palliative care team will continue to follow. Please call our office at [phone] if we can be of additional assistance.   Lurline Idol, FNP-C  COVID-19 PATIENT SCREENING TOOL Asked and negative response unless otherwise noted:  Have you had symptoms of covid, tested positive or been in contact with someone with symptoms/positive test in the past 5-10 days? Has roommate peer positive for SARS-CoV-2 and being quarantined in place.

## 2022-11-29 ENCOUNTER — Encounter: Payer: Self-pay | Admitting: Family Medicine

## 2022-11-29 DIAGNOSIS — R1310 Dysphagia, unspecified: Secondary | ICD-10-CM | POA: Insufficient documentation

## 2022-11-29 DIAGNOSIS — N184 Chronic kidney disease, stage 4 (severe): Secondary | ICD-10-CM | POA: Insufficient documentation

## 2022-11-29 DIAGNOSIS — Z515 Encounter for palliative care: Secondary | ICD-10-CM | POA: Insufficient documentation

## 2022-11-30 ENCOUNTER — Telehealth: Payer: Self-pay | Admitting: Family Medicine

## 2022-11-30 NOTE — Telephone Encounter (Signed)
Left message for pt's daughter advising her of recent visit to family member and desire to discuss visit and obtain additional information from her.  Left phone number for call back.  Damaris Hippo FNP-C

## 2023-01-24 ENCOUNTER — Non-Acute Institutional Stay: Payer: Medicare Other | Admitting: Family Medicine

## 2023-01-24 ENCOUNTER — Encounter: Payer: Self-pay | Admitting: Family Medicine

## 2023-01-24 VITALS — BP 128/64 | HR 57 | Temp 97.2°F | Resp 16

## 2023-01-24 DIAGNOSIS — E43 Unspecified severe protein-calorie malnutrition: Secondary | ICD-10-CM

## 2023-01-24 DIAGNOSIS — I10 Essential (primary) hypertension: Secondary | ICD-10-CM

## 2023-01-24 DIAGNOSIS — R5381 Other malaise: Secondary | ICD-10-CM

## 2023-01-24 NOTE — Progress Notes (Signed)
[facility] Consult Note Telephone: [phone]  Fax: [phone]   Date of encounter: 01/24/23 3:33 PM PATIENT NAME: Manuel Kramer [id number] [place] [address]   [phone] (home)  DOB: 11-Mar-1956 MRN: [id number] PRIMARY CARE PROVIDER:    Vonna Drafts, FNP,  [address] [place] [address] [phone]  REFERRING PROVIDER:   Vonna Drafts, [place],  [place] [address] [phone]  Health Care Agent/Health Care Power of Attorney:    Contact Information     Name Relation Home Work Mobile   [place] Daughter   [phone]   Rhea, [place]   [phone]        I met face to face with patient in [place]. Palliative Care was asked to follow this patient by consultation request of Vonna Drafts, FNP to address advance care planning and complex medical decision making. This is a follow up visit.  CODE STATUS: Full Code   ASSESSMENT AND / RECOMMENDATIONS:  PPS: 40%  Severe protein calorie malnutrition Eighteen pound weight loss since July last year. Continue nephro supplement BID. Has Modafinil which may decrease appetite some. May want to consider addition of Remeron 15 mg QHS to help with depression and appetite stimulation   Physical deconditioning Essentially bedbound with assisted transfers   Essential hypertension Current anithypertensive regimen effective in controlling BP to goal Recommend no change   Follow up Palliative Care Visit:  Palliative Care continuing to follow up by monitoring for changes in appetite, weight, functional and cognitive status for chronic disease progression and management in agreement with patient's stated goals of care. Next visit in 4-6 weeks or prn.  This visit was coded based on medical decision making (MDM).  Chief Complaint  Palliative Care continues to follow  for chronic medical management in setting of hx of CVA with residual deficits.    HISTORY OF PRESENT ILLNESS:  Manuel Kramer is a 67 y.o. year old male with chronic diastolic CHF and hx of CVA with residual deficits.  Pt denies CP, SOB, nausea/vomiting, constipation, falls. He is seen sitting in a wheelchair having just returned to his room.  He states "I'm fine, you need to check him, he is coughing."  He refers to his roommate.  His appetite has improved and he is more independent with his ADLs than previously. He was not forthcoming about his blood sugars. Denies PND and orthopnea. Pt is tolerant of exam, not very forthcoming and doesn't volunteer any information but will answer questions asked.   ACTIVITIES OF DAILY LIVING: CONTINENT OF BLADDER? No CONTINENT OF BOWEL? Yes FEEDING-intermittently requires assistance BATHING/DRESSING-requires assistance   APPETITE? Good WEIGHT:  Last weight 11/21/22 was 68.3 kg, most recent noted weight on 11/28/22 was 67.6 kg  CURRENT PROBLEM LIST:  Patient Active Problem List   Diagnosis Date Noted   Dysphagia 11/29/2022   Palliative care encounter 11/29/2022   CKD (chronic kidney disease) stage 4, GFR 15-29 ml/min ([place]) 11/29/2022   Hypoglycemia 11/08/2022   Essential hypertension 11/08/2022   History of CVA (cerebrovascular accident) 11/08/2022   Seizure disorder ([place]) 05/09/2022   Pressure injury of skin 05/08/2022   Bilateral lower extremity edema 01/25/2022   Type 2 diabetes mellitus with complication, with long-term current use of insulin ([place]) 01/25/2022   Depression with anxiety 01/25/2022   Physical deconditioning 01/25/2022   Left hip pain 01/25/2022   Anemia of chronic disease 01/25/2022  Chronic diastolic CHF (congestive heart failure) ([place]) 01/25/2022   Cerebral embolism with cerebral infarction 11/17/2021   Protein-calorie malnutrition, severe 11/16/2021   Hyperglycemia    Hyperlipidemia 05/22/2016   Diabetic neuropathy ([place])  05/22/2016   Other specified diabetes mellitus without complications ([place]) [id number]   Smoking 10/02/2014   Family history of prostate cancer 10/02/2014   PAST MEDICAL HISTORY:  Active Ambulatory Problems    Diagnosis Date Noted   Other specified diabetes mellitus without complications ([place]) [id number]   Smoking 10/02/2014   Family history of prostate cancer 10/02/2014   Hyperlipidemia 05/22/2016   Diabetic neuropathy ([place]) 05/22/2016   Hyperglycemia    Protein-calorie malnutrition, severe 11/16/2021   Cerebral embolism with cerebral infarction 11/17/2021   Bilateral lower extremity edema 01/25/2022   Type 2 diabetes mellitus with complication, with long-term current use of insulin ([place]) 01/25/2022   Depression with anxiety 01/25/2022   Physical deconditioning 01/25/2022   Left hip pain 01/25/2022   Anemia of chronic disease 01/25/2022   Chronic diastolic CHF (congestive heart failure) ([place]) 01/25/2022   Pressure injury of skin 05/08/2022   Seizure disorder ([place]) 05/09/2022   Hypoglycemia 11/08/2022   Essential hypertension 11/08/2022   History of CVA (cerebrovascular accident) 11/08/2022   Dysphagia 11/29/2022   Palliative care encounter 11/29/2022   CKD (chronic kidney disease) stage 4, GFR 15-29 ml/min ([place]) 11/29/2022   Resolved Ambulatory Problems    Diagnosis Date Noted   Hypertensive emergency 10/02/2014   Renal insufficiency 10/02/2014   Acute encephalopathy 11/14/2021   AKI (acute kidney injury) ([place])    Hypertensive emergency without congestive heart failure    Sepsis ([place])    Acute renal failure superimposed on stage 3b chronic kidney disease ([place]) 01/25/2022   C. difficile colitis 01/26/2022   Hypokalemia [id number]   Acute metabolic encephalopathy 0000000   Acute respiratory failure with hypoxemia ([place]) 05/08/2022   Hyperglycemic hyperosmolar nonketotic coma ([place]) 05/08/2022   Aspiration pneumonia ([place]) 05/08/2022   Acute ischemic stroke (HCC) > small  acute to subacute pontine infarct seen on MRI 05/10/2022   Sepsis due to undetermined organism ([place]) 11/08/2022   Acute on chronic diastolic congestive heart failure ([place]) 11/08/2022   UTI (urinary tract infection) 11/16/2022   Past Medical History:  Diagnosis Date   Chronic diarrhea    Diabetes mellitus without complication ([place])    Hypertension    SOCIAL HX:  Social History   Tobacco Use   Smoking status: Some Days    Packs/day: 0.50    Years: 40.00    Additional pack years: 0.00    Total pack years: 20.00    Types: Cigarettes   Smokeless tobacco: Former  Substance Use Topics   Alcohol use: Not Currently    Alcohol/week: 1.0 standard drink of alcohol    Types: 1 Cans of beer per week    Comment: Patient has not drunk in 3 weeks   FAMILY HX:  Family History  Problem Relation Age of Onset   Cancer Mother    Diabetes Mother    Hypertension Mother    Cancer Father        Preferred Pharmacy: ALLERGIES:  Allergies  Allergen Reactions   Codeine Other (See Comments)    On MAR, unknown reaction   Iodinated Contrast Media Other (See Comments)    On MAR, unknown reaction   Pentazocine Other (See Comments)    On MAR, unknown reaction   Statins Other (See Comments)    On MAR, unknown reaction  PERTINENT MEDICATIONS:  Outpatient Encounter Medications as of 01/24/2023  Medication Sig   acetaminophen (TYLENOL) 500 MG tablet Take 1 tablet (500 mg total) by mouth every 6 (six) hours as needed. (Patient taking differently: Take 500 mg by mouth every 6 (six) hours as needed for moderate pain.)   acidophilus (RISAQUAD) CAPS capsule Take 2 capsules by mouth daily.   amLODipine (NORVASC) 10 MG tablet Take 1 tablet (10 mg total) by mouth daily.   aspirin EC 81 MG tablet Take 81 mg by mouth daily. Swallow whole.   atorvastatin (LIPITOR) 40 MG tablet Take 1 tablet (40 mg total) by mouth daily.   carvedilol (COREG) 25 MG tablet Take 1 tablet (25 mg total) by mouth 2 (two) times  daily with a meal.   cholecalciferol (VITAMIN D3) 25 MCG (1000 UNIT) tablet Take 1,000 Units by mouth daily.   Cinnamon 500 MG capsule Take 500 mg by mouth daily.   cloNIDine (CATAPRES) 0.3 MG tablet Take 1 tablet (0.3 mg total) by mouth 3 (three) times daily.   clopidogrel (PLAVIX) 75 MG tablet Take 75 mg by mouth daily.   doxazosin (CARDURA) 2 MG tablet Take 1 tablet (2 mg total) by mouth daily.   ferrous sulfate 325 (65 FE) MG tablet Take 1 tablet (325 mg total) by mouth daily with breakfast.   hydrALAZINE (APRESOLINE) 100 MG tablet Take 1 tablet (100 mg total) by mouth 3 (three) times daily.   insulin aspart (NOVOLOG) 100 UNIT/ML injection Inject 3 Units into the skin 3 (three) times daily with meals. If eats more than 50% of meals (Patient taking differently: Inject 5 Units into the skin 3 (three) times daily with meals.)   ipratropium (ATROVENT) 0.06 % nasal spray Place 2 sprays into both nostrils 2 (two) times daily.   isosorbide dinitrate (ISORDIL) 40 MG tablet Take 40 mg by mouth 3 (three) times daily.   levETIRAcetam (KEPPRA) 500 MG tablet Take 1 tablet (500 mg total) by mouth 2 (two) times daily.   loratadine (CLARITIN) 10 MG tablet Take 10 mg by mouth every other day.   magnesium oxide (MAG-OX) 400 MG tablet Take 1 tablet (400 mg total) by mouth daily.   modafinil (PROVIGIL) 100 MG tablet Take 1 tablet (100 mg total) by mouth daily.   Multiple Vitamin (MULTIVITAMIN WITH MINERALS) TABS tablet Take 1 tablet by mouth daily.   niacin 100 MG tablet Take 100 mg by mouth 2 (two) times daily.   Nutritional Supplements (FEEDING SUPPLEMENT, NEPRO CARB STEADY,) LIQD Take 237 mLs by mouth 2 (two) times daily between meals.   sertraline (ZOLOFT) 50 MG tablet Take 50 mg by mouth daily.   sodium bicarbonate 650 MG tablet Take 1,300 mg by mouth 2 (two) times daily.   spironolactone (ALDACTONE) 25 MG tablet Take 1 tablet (25 mg total) by mouth daily.   Torsemide 40 MG TABS Take 40 mg by mouth daily.    No facility-administered encounter medications on file as of 01/24/2023.    History obtained from review of EMR, discussion with facility staff/caregiver and/or patient.    I reviewed available labs, medications, imaging, studies and related documents from the EMR.  There were no new records/imaging since last visit.   Physical Exam: GENERAL: NAD LUNGS: CTAB, no increased work of breathing, room air CARDIAC:  S1S2, RRR with no MRG, BLE 1+ edema, No cyanosis ABD:  Normo-active BS x 4 quads, soft, non-tender EXTREMITIES:  Some right sided residual deficit, No muscle atrophy/subcutaneous fat loss NEURO:  No cognitive impairment at present time PSYCH:  Blunted, non-anxious affect, A & O x 3  Thank you for the opportunity to participate in the care of [facility]. Please call our main office at [phone] if we can be of additional assistance.    Damaris Hippo FNP-C  [email or web address]  Phone:  [phone]

## 2023-01-27 ENCOUNTER — Encounter: Payer: Self-pay | Admitting: Family Medicine

## 2023-02-22 ENCOUNTER — Other Ambulatory Visit: Payer: Self-pay

## 2023-02-22 ENCOUNTER — Emergency Department (HOSPITAL_COMMUNITY)
Admission: EM | Admit: 2023-02-22 | Discharge: 2023-02-23 | Disposition: A | Discharge status: Skilled Nursing Facility | Payer: Medicare Other | Attending: Emergency Medicine | Admitting: Emergency Medicine

## 2023-02-22 ENCOUNTER — Emergency Department (HOSPITAL_COMMUNITY): Payer: Medicare Other

## 2023-02-22 DIAGNOSIS — I509 Heart failure, unspecified: Secondary | ICD-10-CM | POA: Diagnosis not present

## 2023-02-22 DIAGNOSIS — F17200 Nicotine dependence, unspecified, uncomplicated: Secondary | ICD-10-CM | POA: Diagnosis not present

## 2023-02-22 DIAGNOSIS — R739 Hyperglycemia, unspecified: Secondary | ICD-10-CM

## 2023-02-22 DIAGNOSIS — E1165 Type 2 diabetes mellitus with hyperglycemia: Secondary | ICD-10-CM | POA: Insufficient documentation

## 2023-02-22 DIAGNOSIS — N189 Chronic kidney disease, unspecified: Secondary | ICD-10-CM | POA: Insufficient documentation

## 2023-02-22 DIAGNOSIS — Z794 Long term (current) use of insulin: Secondary | ICD-10-CM | POA: Insufficient documentation

## 2023-02-22 LAB — I-STAT VENOUS BLOOD GAS, ED
Acid-Base Excess: 4 mmol/L — ABNORMAL HIGH (ref 0.0–2.0)
Bicarbonate: 27.7 mmol/L (ref 20.0–28.0)
Calcium, Ion: 1.14 mmol/L — ABNORMAL LOW (ref 1.15–1.40)
HCT: 23 % — ABNORMAL LOW (ref 39.0–52.0)
Hemoglobin: 7.8 g/dL — ABNORMAL LOW (ref 13.0–17.0)
O2 Saturation: 97 %
Potassium: 4.6 mmol/L (ref 3.5–5.1)
Sodium: 137 mmol/L (ref 135–145)
TCO2: 29 mmol/L (ref 22–32)
pCO2, Ven: 37.3 mmHg — ABNORMAL LOW (ref 44–60)
pH, Ven: 7.48 — ABNORMAL HIGH (ref 7.25–7.43)
pO2, Ven: 85 mmHg — ABNORMAL HIGH (ref 32–45)

## 2023-02-22 LAB — COMPREHENSIVE METABOLIC PANEL
ALT: 174 U/L — ABNORMAL HIGH (ref 0–44)
AST: 146 U/L — ABNORMAL HIGH (ref 15–41)
Albumin: 2.5 g/dL — ABNORMAL LOW (ref 3.5–5.0)
Alkaline Phosphatase: 112 U/L (ref 38–126)
Anion gap: 12 (ref 5–15)
BUN: 78 mg/dL — ABNORMAL HIGH (ref 8–23)
CO2: 25 mmol/L (ref 22–32)
Calcium: 8.6 mg/dL — ABNORMAL LOW (ref 8.9–10.3)
Chloride: 100 mmol/L (ref 98–111)
Creatinine, Ser: 3.44 mg/dL — ABNORMAL HIGH (ref 0.61–1.24)
GFR, Estimated: 19 mL/min — ABNORMAL LOW (ref >60)
Glucose, Bld: 532 mg/dL (ref 70–99)
Potassium: 4.5 mmol/L (ref 3.5–5.1)
Sodium: 137 mmol/L (ref 135–145)
Total Bilirubin: 0.3 mg/dL (ref 0.3–1.2)
Total Protein: 5.6 g/dL — ABNORMAL LOW (ref 6.5–8.1)

## 2023-02-22 LAB — CBC WITH DIFFERENTIAL/PLATELET
Abs Immature Granulocytes: 0.03 10*3/uL (ref 0.00–0.07)
Basophils Absolute: 0 10*3/uL (ref 0.0–0.1)
Basophils Relative: 0 %
Eosinophils Absolute: 0.2 10*3/uL (ref 0.0–0.5)
Eosinophils Relative: 3 %
HCT: 23.8 % — ABNORMAL LOW (ref 39.0–52.0)
Hemoglobin: 7.4 g/dL — ABNORMAL LOW (ref 13.0–17.0)
Immature Granulocytes: 0 %
Lymphocytes Relative: 14 %
Lymphs Abs: 1.1 10*3/uL (ref 0.7–4.0)
MCH: 31.4 pg (ref 26.0–34.0)
MCHC: 31.1 g/dL (ref 30.0–36.0)
MCV: 100.8 fL — ABNORMAL HIGH (ref 80.0–100.0)
Monocytes Absolute: 0.5 10*3/uL (ref 0.1–1.0)
Monocytes Relative: 6 %
Neutro Abs: 5.9 10*3/uL (ref 1.7–7.7)
Neutrophils Relative %: 77 %
Platelets: 233 10*3/uL (ref 150–400)
RBC: 2.36 MIL/uL — ABNORMAL LOW (ref 4.22–5.81)
RDW: 13.9 % (ref 11.5–15.5)
WBC: 7.7 10*3/uL (ref 4.0–10.5)
nRBC: 0 % (ref 0.0–0.2)

## 2023-02-22 LAB — CBG MONITORING, ED: Glucose-Capillary: 499 mg/dL — ABNORMAL HIGH (ref 70–99)

## 2023-02-22 MED ORDER — SODIUM CHLORIDE 0.9 % IV BOLUS
1000.0000 mL | Freq: Once | INTRAVENOUS | Status: AC
  Administered 2023-02-22: 1000 mL via INTRAVENOUS

## 2023-02-22 MED ORDER — INSULIN ASPART 100 UNIT/ML IJ SOLN
10.0000 [IU] | Freq: Once | INTRAMUSCULAR | Status: AC
  Administered 2023-02-22: 10 [IU] via SUBCUTANEOUS

## 2023-02-22 NOTE — ED Provider Notes (Signed)
[place] EMERGENCY DEPARTMENT AT [facility] Provider Note  Medical Decision Making   HPI: Manuel Kramer is a 67 y.o. male with history perinent T2DM on insulin, depression/anxiety, anemia, CHF, smoking, protein calorie malnutrition, CVA, seizure disorder, CKD who presents complaining of abnormal labs. Patient arrived via EMS from [facility].  History provided by EMS.  No interpreter required for this encounter.  EMS reports that they were called out to the patient's facility for abnormal labs, specifically that he was hyperglycemic.  Reports that patient has not required any interventions en route.  Does report that he has a PICC in place in his right upper extremity, has a history of pulling out IVs, is a full code.  Patient denies any complaints.  Review of labs brought from outside facility baseline creatinine of approximately 3, baseline BUN approximately 7 days, mildly elevated LFTs with AST, ALT and alk phos all greater than 100 on the 24th and 25 April.  Hemoglobin 6.9 on 4/24, 9 on 4/25.  ROS: As per HPI. Please see MAR for complete past medical history, surgical history, and social history.   Physical exam is pertinent for thin and chronically appearing, however no acute focal deficits.   The differential includes but is not limited to anemia, emergent electrolyte derangement, AKI, DKA, HHS.  Additional history obtained from: Outside records, son External records from outside source obtained and reviewed including: Reviewed outside records brought in by EMS, see AKI  ED provider interpretation of ECG: No indicated  ED provider interpretation of radiology/imaging: CXR with cardiomegaly, overall similar, in comparison to prior from 11/05/2022.  No focal airspace opacifications, pneumothorax, pleural effusion, bony derangement  Labs ordered were interpreted by myself as well as my attending and were incorporated into the medical decision making process for this  patient.  ED provider interpretation of labs: No leukocytosis, anemia to 7.4, consistent with prior, no thrombocytosis or thrombocytopenia.  CMP with elevation of creatinine and BUN similar to prior.  Glucose elevated at 532, no anion gap elevation.  VBG without acidosis, pH 7.48, hemoglobin 7.8 consistent with CBC.  Interventions: Insulin, fluids  See the EMR for full details regarding lab and imaging results.  Patient overall chronically ill-appearing, however no focal complaints or deficits.  Patient has multiple derangements on labs, however over read demonstrated for multiple days.  Lab workup as well as chest x-ray are indicated.  Patient does have significant anemia, however does not have anemia to the level that transfusion is indicated.  LFTs and kidney function are also elevated, however consistent with prior labs obtained at facility.  Patient has significant hyperglycemia, however given he is reportedly at his mental status baseline, doubt HHS, no anion gap elevation or acidosis, doubt DKA.  Fluids and insulin administered for elevated glucose.  Spoke with patient's son, Manuel Kramer, [phone], 2 demographic factors used to protect privacy. Discussed patient's current condition and how labs were primarily remarkable for hyperglycemia.  Patient reports that this is also at patient's facility was concern for.  Reports that patient is managed by Dr. Eula Listen as well as a another provider (nurse practitioner?) named Desiree.  Reports that when [name] spoke with Desiree earlier today they also were discussing how patient had elevated glucose and that he needed to come to the emergency department for fluids, however that both Desiree and [name] wanted the patient discharged back to the facility, as they feel that he had worsening decline after his last hospitalization.  [name] also mention that [name] was  concern for some "patches" on patient's chest x-ray and that the patient needed antibiotics.  We  discussed that chest x-ray is overall very similar compared to chest x-ray from November 08, 2022, and that I do not appreciate any significant focal opacifications, in combination with patient's normal respiratory status, lungs CTAB, afebrile, lack of leukocytosis, do not feel that patient requires antibiotics at this time.  Patient expressed understanding, states that he lives out of town, however he is planning to visit his father in town tomorrow.  Overall, feels that management of hyperglycemia and subsequent discharge is misaligned with patient and family's goals.  On recheck after initial insulin patient is still hyperglycemic to 450s.  Additional fluids as well as scheduled sliding scale insulin ordered.  Plan at the time of handoff, follow-up repeat CBG, likely will require additional insulin anticipate discharge back to facility.  Consults: None  Disposition: HANDOFF: At the time of signout, the patients repeat insulin and CBG checks had not yet been completed. I transferred care of the patient at the time of signout to [place], [place]. I informed the incoming care provider of the patient's history, status, and management plan. I addressed all of their concerns and/or questions to the best of my ability. Please refer to the incoming care provider's note for details regarding the remainder of the patient's ED course and disposition.  The plan for this patient was discussed with Dr. Silverio Lay, who voiced agreement and who oversaw evaluation and treatment of this patient.  Clinical Impression:  1. Hyperglycemia    Data Unavailable  Therapies: These medications and interventions were provided for the patient while in the ED. Medications  insulin aspart (novoLOG) injection 0-9 Units (has no administration in time range)  sodium chloride 0.9 % bolus 1,000 mL (0 mLs Intravenous Stopped 02/22/23 2305)  insulin aspart (novoLOG) injection 10 Units (10 Units Subcutaneous Given 02/22/23 2326)  lactated  ringers bolus 500 mL (500 mLs Intravenous New Bag/Given 02/23/23 0057)    MDM generated using voice dictation software and may contain dictation errors.  Please contact me for any clarification or with any questions.  Clinical Complexity A medically appropriate history, review of systems, and physical exam was performed.  Collateral history obtained from: Outside records, son I personally reviewed the labs, EKG, imaging as discussed above. Patient's presentation is most consistent with acute complicated illness / injury requiring diagnostic workup Considered and ruled out life and body threatening conditions  Treatment: Pending Patient's ability to provide history increases the complexity of managing their presentation. Medications: Prescription Discussed patient's care with providers from the following different specialties: None  Physical Exam   ED Triage Vitals  Enc Vitals Group     BP 02/22/23 2115 (!) 147/85     Pulse Rate 02/22/23 2115 64     Resp 02/22/23 2115 10     Temp 02/22/23 2114 97.8 F (36.6 C)     Temp Source 02/22/23 2114 Oral     SpO2 02/22/23 2108 94 %     Weight --      Height --      Head Circumference --      Peak Flow --      Pain Score --      Pain Loc --      Pain Edu? --      Excl. in GC? --      Physical Exam Vitals and nursing note reviewed.  Constitutional:      General: He is not in acute  distress.    Appearance: He is well-developed.     Comments: Thin and chronically ill-appearing  HENT:     Head: Normocephalic and atraumatic.  Eyes:     Extraocular Movements: Extraocular movements intact.     Conjunctiva/sclera: Conjunctivae normal.  Cardiovascular:     Rate and Rhythm: Normal rate and regular rhythm.     Heart sounds: No murmur heard. Pulmonary:     Effort: Pulmonary effort is normal. No respiratory distress.     Breath sounds: Normal breath sounds.  Abdominal:     Palpations: Abdomen is soft.     Tenderness: There is no  abdominal tenderness.  Musculoskeletal:        General: No swelling.     Cervical back: Neck supple.  Skin:    General: Skin is warm and dry.     Capillary Refill: Capillary refill takes less than 2 seconds.  Neurological:     Mental Status: He is alert.     Comments: Oriented to self  Psychiatric:        Mood and Affect: Mood normal.       Procedure Note  Procedures  DG Chest Port 1 View  Final Result      Julianne Rice, MD Emergency Medicine, PGY-2   Curley Spice, MD 02/23/23 0135    Charlynne Pander, MD 02/23/23 2056

## 2023-02-22 NOTE — ED Triage Notes (Signed)
Pt BIB [facility] from [facility] for abnormal labs. CBG simply reading "High" EMS reports patient was combative en route and the facility reports to them that he has had increasing agitation recently. BP 110/72 HR 68 94% RA

## 2023-02-23 DIAGNOSIS — E1165 Type 2 diabetes mellitus with hyperglycemia: Secondary | ICD-10-CM | POA: Diagnosis not present

## 2023-02-23 LAB — CBG MONITORING, ED
Glucose-Capillary: 193 mg/dL — ABNORMAL HIGH (ref 70–99)
Glucose-Capillary: 337 mg/dL — ABNORMAL HIGH (ref 70–99)
Glucose-Capillary: 412 mg/dL — ABNORMAL HIGH (ref 70–99)
Glucose-Capillary: 458 mg/dL — ABNORMAL HIGH (ref 70–99)
Glucose-Capillary: 99 mg/dL (ref 70–99)

## 2023-02-23 LAB — TYPE AND SCREEN
ABO/RH(D): O POS
Antibody Screen: NEGATIVE

## 2023-02-23 LAB — LIPASE, BLOOD: Lipase: 27 U/L (ref 11–51)

## 2023-02-23 MED ORDER — LACTATED RINGERS IV BOLUS
500.0000 mL | Freq: Once | INTRAVENOUS | Status: AC
  Administered 2023-02-23: 500 mL via INTRAVENOUS

## 2023-02-23 MED ORDER — INSULIN ASPART 100 UNIT/ML IJ SOLN
0.0000 [IU] | INTRAMUSCULAR | Status: DC
  Administered 2023-02-23: 7 [IU] via SUBCUTANEOUS
  Administered 2023-02-23: 9 [IU] via SUBCUTANEOUS

## 2023-02-23 NOTE — ED Provider Notes (Signed)
Received at shift change from laurie stanek MD please see note for full detail  In short patient with medical history including type 2 diabetes, depression, anxiety, CHF, CVA, seizures, CKD presenting with abnormal labs.  Patient is a poor historian and is unable to tell me much information but states that he has no complaints at this time.  Per previous provider patient presents from nursing facility, concerns the patient had an elevated glucose, patient is not on long-acting insulin and takes short acting.  Patient is currently at his baseline, no fever chills cough congestion, no recent falls.  Spoke with family members who would like patient discharged back to facility.  Per previous provider reassessed patient after glucose has been stabilized to be discharged home. Physical Exam  BP (!) 179/92   Pulse 64   Temp 98.7 F (37.1 C) (Oral)   Resp 12   SpO2 100%   Physical Exam Vitals and nursing note reviewed.  Constitutional:      General: He is not in acute distress.    Appearance: He is not ill-appearing.  HENT:     Head: Normocephalic and atraumatic.     Nose: No congestion.  Eyes:     Conjunctiva/sclera: Conjunctivae normal.  Cardiovascular:     Rate and Rhythm: Normal rate and regular rhythm.     Pulses: Normal pulses.  Pulmonary:     Effort: Pulmonary effort is normal.  Abdominal:     Palpations: Abdomen is soft.     Tenderness: There is no abdominal tenderness. There is no right CVA tenderness or left CVA tenderness.  Musculoskeletal:     Right lower leg: No edema.     Left lower leg: No edema.     Comments: No acute leg swelling no calf tenderness no palpable cords  Skin:    General: Skin is warm and dry.  Neurological:     Mental Status: He is alert.     Comments: No facial asymmetry, no obvious unilateral weakness present.  Able to follow two-step commands.  Psychiatric:        Mood and Affect: Mood normal.     Procedures  Procedures  ED Course / MDM    Clinical Course as of 02/23/23 0638  Thu Feb 22, 2023  2248 Dr Eula Listen, and deseree will be back tomorrow [LS]    Clinical Course User Index [LS] Curley Spice, MD   Medical Decision Making Amount and/or Complexity of Data Reviewed Labs: ordered. Radiology: ordered.  Risk Prescription drug management.    Lab Tests:  I Ordered, and personally interpreted labs.  The pertinent results include: CBC shows macrocytic anemia with a hemoglobin of 7.4 at baseline, CMP reveals glucose of 532, BUN 78 creatinine 3.44 at baseline, AST 146 ALT 174 at baseline for patient,   Imaging Studies ordered:  I ordered imaging studies including chest x-ray I independently visualized and interpreted imaging which showed negative acute finding I agree with the radiologist interpretation   Cardiac Monitoring:  The patient was maintained on a cardiac monitor.  I personally viewed and interpreted the cardiac monitored which showed an underlying rhythm of: N/A   Medicines ordered and prescription drug management:  I ordered medication including insulin I have reviewed the patients home medicines and have made adjustments as needed  Critical Interventions:  N/A   Reevaluation:  Reassessed the patient, resting comfortably, glucose proved after insulin and fluids, vital signs reassuring, patient is ready for discharge.  Consultations Obtained:  N/A   Test  Considered:  N/A    Rule out Suspicion for DKA or HHS is low at this time there is no decrease in pH, no anion gap, no decrease in CO2, patient's none toxic appearing, vital signs reassuring, does not appear to be overly dehydrated on my examination.  I doubt biliary or hepatic abnormality she has no right upper quadrant tenderness, no elevation in T. bili.  Patient does have slight elevation in liver enzymes, possible this is transient, will have them reassessed by primary care provider.  Suspicion for pancreatitis is low  lipase  within normal limits, no epigastric tenderness.    Dispostion and problem list  After consideration of the diagnostic results and the patients response to treatment, I feel that the patent would benefit from discharge.    Hyperglycemia-since resolved, likely from poor compliance, will encourage him continue with his diabetes medication and follow with his PCP for reassessment.       Carroll Sage, PA-C 02/23/23 1610    Gilda Crease, MD 02/23/23 [phone]

## 2023-02-23 NOTE — Discharge Instructions (Addendum)
Manuel Kramer  Thank you for allowing [place] to take care of you today.  You came to the Emergency Department today because you had a hyperglycemia at your facility.  Overall your kidney function and liver function are elevated, it looks like they have been elevated for several days as well, your hemoglobin is low, it looks like it has been low, it is not low enough that you need a transfusion, talking with your son Manuel Kramer, it seems like the main abnormality that you were concerned about at your facility was the high blood sugar, we gave you insulin and fluids here in the emergency department to bring it down, we do not think that you are having an emergency related to your high blood sugars such as DKA.  That there were some concern regarding a chest x-ray you recently received, we did a chest x-ray here in the emergency department, it  is overall very similar compared to chest x-ray from November 08, 2022, there are not any significant focal opacifications, in combination with your normal respiratory status, lungs CTAB, afebrile, lack of leukocytosis, do not feel that patient requires antibiotics at this time.  To-Do: 1. Please follow-up with your primary doctor within 2 days / as soon as possible.    Please return to the Emergency Department or call 911 if you experience have worsening of your symptoms, or do not get better, chest pain, shortness of breath, severe or significantly worsening pain, high fever, severe confusion, pass out or have any reason to think that you need emergency medical care.   We hope you feel better soon.   Department of Emergency Medicine [facility]

## 2023-02-23 NOTE — ED Notes (Signed)
Report given to [facility] prior to pt being transported back to [facility] facility via ambulance. Belongings with pt upon depart. VSS.

## 2023-02-23 NOTE — ED Notes (Signed)
[facility] CALLED THEY WILL PICK UP PT AS SOON AS POSSIBLE,.

## 2023-03-23 ENCOUNTER — Non-Acute Institutional Stay: Payer: Medicare Other | Admitting: Family Medicine

## 2023-03-23 ENCOUNTER — Encounter: Payer: Self-pay | Admitting: Family Medicine

## 2023-03-23 VITALS — BP 118/68 | HR 74 | Temp 97.2°F | Resp 18

## 2023-03-23 DIAGNOSIS — Z794 Long term (current) use of insulin: Secondary | ICD-10-CM

## 2023-03-23 DIAGNOSIS — E43 Unspecified severe protein-calorie malnutrition: Secondary | ICD-10-CM

## 2023-03-23 DIAGNOSIS — R7401 Elevation of levels of liver transaminase levels: Secondary | ICD-10-CM

## 2023-03-23 NOTE — Progress Notes (Unsigned)
Therapist, nutritional Palliative Care Consult Note Telephone: [phone]  Fax: [phone]   Date of encounter: 03/23/23 4:16 PM PATIENT NAME: Manuel Kramer [id number] [place] [address]   [phone] (home)  DOB: 05/16/1956 MRN: [id number] PRIMARY CARE PROVIDER:    Diamantina Providence, FNP,  [address] [place] [address] [phone]  REFERRING PROVIDER:   Diamantina Providence, FNP [address] [place],  [place] [address] [phone]  Health Care Agent/Health Care Power of Attorney:    Contact Information     Name Relation Home Work Mobile   Demuro,Chris Son   [phone]   Taevyn, Vanalst Daughter   [phone]        I met face to face with patient in [facility]. Palliative Care was asked to follow this patient by consultation request of Diamantina Providence, FNP to address advance care planning and complex medical decision making. This is a follow up visit.  CODE STATUS: Full Code   ASSESSMENT AND / RECOMMENDATIONS:  PPS: 40%   Type 2 DM with complication and long term use of insulin Noted hyperglycemia in the 400-500 range during most recent ED visit without hypoglycemia. Has been receiving 3 units before meals + SSI. Continue Lantus 12 units daily, increase Q 3 days by 2 units until fasting blood sugar < 150 without hypoglycemia.   Transaminitis Recommend repeat LFTs, HCV and acute hepatitis screen if not previously done and repeat liver US if not trending down. Check med profile against liver tox to see if there are any agents that may be contributing. May need to hold Lipitor.    Protein calorie malnutrition Albumin low at 2.7. On Nephro carb steady supplement, would see about increasing protein portions with meals and snacks  Recommend follow up with nutritionist to address special needs given DM, CKD and transaminitis.   Follow up Palliative Care Visit:   Palliative Care continuing to follow up by monitoring for changes in appetite, weight, functional and cognitive status for chronic disease progression and management in agreement with patient's stated goals of care. Next visit in 4 weeks or prn.  This visit was coded based on medical decision making (MDM).  Chief Complaint  Palliative Care continues to follow for chronic medical management in setting of hyperglycemia with severe protein calorie malnutrition.    HISTORY OF PRESENT ILLNESS:  Manuel Kramer is a 67 y.o. year old male with chronic diastolic CHF and hx of CVA with residual deficits.  Pt denies CP, SOB, nausea/vomiting, constipation, dysuria. He is seen sitting in a wheelchair having in the common area. State his appetite is good and he is sleeping well. Pt with recent ED visit for hyperglycemia 02/22/23, LFTs were noted to be elevated .  Lipase was normal at 27. Had prior US done on 12/05/21 to evaluate for ascites and none noted.  Renal US done 01/25/22 with no acute renal finding and debris in the bladder. CT abd/pelvis without contrast on 02/21/22 showed anasarca but no focal hepatic lesions.  Last HGB A1c done 11/08/22 was in good control at 6.0%.   ACTIVITIES OF DAILY LIVING: CONTINENT OF BLADDER? No CONTINENT OF BOWEL? Yes FEEDING-intermittently requires assistance BATHING/DRESSING-requires assistance   APPETITE? Good WEIGHT:  No more recent weight noted.  Last weight 11/21/22 was 68.3 kg, most recent noted weight on 11/28/22 was 67.6 kg  CURRENT PROBLEM LIST:  Patient Active Problem List   Diagnosis Date Noted  Dysphagia 11/29/2022   Palliative care encounter 11/29/2022   CKD (chronic kidney disease) stage 4, GFR 15-29 ml/min (HCC) 11/29/2022   Hypoglycemia 11/08/2022   Essential hypertension 11/08/2022   History of CVA (cerebrovascular accident) 11/08/2022   Seizure disorder (HCC) 05/09/2022   Pressure injury of skin 05/08/2022   Bilateral lower extremity edema 01/25/2022    Type 2 diabetes mellitus with complication, with long-term current use of insulin (HCC) 01/25/2022   Depression with anxiety 01/25/2022   Physical deconditioning 01/25/2022   Left hip pain 01/25/2022   Anemia of chronic disease 01/25/2022   Chronic diastolic CHF (congestive heart failure) (HCC) 01/25/2022   Cerebral embolism with cerebral infarction 11/17/2021   Protein-calorie malnutrition, severe 11/16/2021   Hyperglycemia    Hyperlipidemia 05/22/2016   Diabetic neuropathy (HCC) 05/22/2016   Other specified diabetes mellitus without complications (HCC) 10/02/2014   Smoking 10/02/2014   Family history of prostate cancer 10/02/2014   PAST MEDICAL HISTORY:  Active Ambulatory Problems    Diagnosis Date Noted   Other specified diabetes mellitus without complications (HCC) 10/02/2014   Smoking 10/02/2014   Family history of prostate cancer 10/02/2014   Hyperlipidemia 05/22/2016   Diabetic neuropathy (HCC) 05/22/2016   Hyperglycemia    Protein-calorie malnutrition, severe 11/16/2021   Cerebral embolism with cerebral infarction 11/17/2021   Bilateral lower extremity edema 01/25/2022   Type 2 diabetes mellitus with complication, with long-term current use of insulin (HCC) 01/25/2022   Depression with anxiety 01/25/2022   Physical deconditioning 01/25/2022   Left hip pain 01/25/2022   Anemia of chronic disease 01/25/2022   Chronic diastolic CHF (congestive heart failure) (HCC) 01/25/2022   Pressure injury of skin 05/08/2022   Seizure disorder (HCC) 05/09/2022   Hypoglycemia 11/08/2022   Essential hypertension 11/08/2022   History of CVA (cerebrovascular accident) 11/08/2022   Dysphagia 11/29/2022   Palliative care encounter 11/29/2022   CKD (chronic kidney disease) stage 4, GFR 15-29 ml/min (HCC) 11/29/2022   Resolved Ambulatory Problems    Diagnosis Date Noted   Hypertensive emergency 10/02/2014   Renal insufficiency 10/02/2014   Acute encephalopathy 11/14/2021   AKI (acute  kidney injury) [facility])    Hypertensive emergency without congestive heart failure    Sepsis (HCC)    Acute renal failure superimposed on stage 3b chronic kidney disease (HCC) 01/25/2022   C. difficile colitis 01/26/2022   Hypokalemia 01/26/2022   Acute metabolic encephalopathy 05/08/2022   Acute respiratory failure with hypoxemia (HCC) 05/08/2022   Hyperglycemic hyperosmolar nonketotic coma (HCC) 05/08/2022   Aspiration pneumonia (HCC) 05/08/2022   Acute ischemic stroke (HCC) > small acute to subacute pontine infarct seen on MRI 05/10/2022   Sepsis due to undetermined organism (HCC) 11/08/2022   Acute on chronic diastolic congestive heart failure (HCC) 11/08/2022   UTI (urinary tract infection) 11/16/2022   Past Medical History:  Diagnosis Date   Chronic diarrhea    Diabetes mellitus without complication (HCC)    Hypertension    SOCIAL HX:  Social History   Tobacco Use   Smoking status: Some Days    Packs/day: 0.50    Years: 40.00    Additional pack years: 0.00    Total pack years: 20.00    Types: Cigarettes   Smokeless tobacco: Former  Substance Use Topics   Alcohol use: Not Currently    Alcohol/week: 1.0 standard drink of alcohol    Types: 1 Cans of beer per week    Comment: Patient has not drunk in 3 weeks   FAMILY HX:  Family History  Problem Relation Age of Onset   Cancer Mother    Diabetes Mother    Hypertension Mother    Cancer Father        Preferred Pharmacy: ALLERGIES:  Allergies  Allergen Reactions   Codeine Other (See Comments)    On MAR, unknown reaction   Iodinated Contrast Media Other (See Comments)    On MAR, unknown reaction   Pentazocine Other (See Comments)    On MAR, unknown reaction **Talwin**   Statins Other (See Comments)    On MAR, unknown reaction     PERTINENT MEDICATIONS:  Outpatient Encounter Medications as of 03/23/2023  Medication Sig   acetaminophen (TYLENOL) 500 MG tablet Take 1 tablet (500 mg total) by mouth every 6 (six)  hours as needed. (Patient taking differently: Take 500 mg by mouth every 6 (six) hours as needed for moderate pain.)   acidophilus (RISAQUAD) CAPS capsule Take 2 capsules by mouth daily. (Patient taking differently: Take 1 capsule by mouth in the morning and at bedtime.)   amLODipine (NORVASC) 10 MG tablet Take 1 tablet (10 mg total) by mouth daily.   aspirin EC 81 MG tablet Take 81 mg by mouth daily. Swallow whole.   atorvastatin (LIPITOR) 40 MG tablet Take 1 tablet (40 mg total) by mouth daily.   carvedilol (COREG) 25 MG tablet Take 1 tablet (25 mg total) by mouth 2 (two) times daily with a meal.   cholecalciferol (VITAMIN D3) 25 MCG (1000 UNIT) tablet Take 1,000 Units by mouth daily.   Cinnamon 500 MG capsule Take 500 mg by mouth daily.   cloNIDine (CATAPRES) 0.3 MG tablet Take 1 tablet (0.3 mg total) by mouth 3 (three) times daily.   clopidogrel (PLAVIX) 75 MG tablet Take 75 mg by mouth daily.   doxazosin (CARDURA) 2 MG tablet Take 1 tablet (2 mg total) by mouth daily.   ferrous sulfate 325 (65 FE) MG tablet Take 1 tablet (325 mg total) by mouth daily with breakfast.   hydrALAZINE (APRESOLINE) 100 MG tablet Take 1 tablet (100 mg total) by mouth 3 (three) times daily.   Infant Care Products [facility]) OINT Apply 1 application  topically in the morning and at bedtime. Apply topically to buttocks   insulin aspart (NOVOLOG) 100 UNIT/ML injection Inject 3 Units into the skin 3 (three) times daily with meals. If eats more than 50% of meals (Patient taking differently: Inject 2-11 Units into the skin 4 (four) times daily -  before meals and at bedtime.  S/S 101-150= 2U, 151-200= 3U, 201-250= 5U, 251-300= 7U, 301-350= 9U, >350= 11U)   ipratropium (ATROVENT) 0.06 % nasal spray Place 2 sprays into both nostrils 2 (two) times daily.   isosorbide dinitrate (ISORDIL) 40 MG tablet Take 40 mg by mouth 3 (three) times daily.   levETIRAcetam (KEPPRA) 500 MG tablet Take 1 tablet (500 mg total) by mouth 2 (two)  times daily.   loratadine (CLARITIN) 10 MG tablet Take 10 mg by mouth every other day.   magnesium oxide (MAG-OX) 400 MG tablet Take 1 tablet (400 mg total) by mouth daily.   mirtazapine (REMERON) 7.5 MG tablet Take 7.5 mg by mouth at bedtime.   modafinil (PROVIGIL) 100 MG tablet Take 1 tablet (100 mg total) by mouth daily.   Multiple Vitamin (MULTIVITAMIN WITH MINERALS) TABS tablet Take 1 tablet by mouth daily.   niacin 100 MG tablet Take 100 mg by mouth 2 (two) times daily.   Nutritional Supplements (FEEDING SUPPLEMENT, NEPRO CARB  STEADY,) LIQD Take 237 mLs by mouth 2 (two) times daily between meals. (Patient not taking: Reported on 02/23/2023)   sertraline (ZOLOFT) 50 MG tablet Take 50 mg by mouth daily.   sodium bicarbonate 650 MG tablet Take 1,300 mg by mouth 2 (two) times daily.   spironolactone (ALDACTONE) 25 MG tablet Take 1 tablet (25 mg total) by mouth daily.   Torsemide 40 MG TABS Take 40 mg by mouth daily.   No facility-administered encounter medications on file as of 03/23/2023.    History obtained from review of EMR, discussion with facility staff/caregiver and/or patient.  ABG    Component Value Date/Time   PHART 7.474 (H) 05/07/2022 2104   PCO2ART 39.0 05/07/2022 2104   PO2ART 433 (H) 05/07/2022 2104   HCO3 27.7 02/22/2023 2212   TCO2 29 02/22/2023 2212   O2SAT 97 02/22/2023 2212    02/22/23:    Latest Ref Rng & Units 02/22/2023   10:12 PM 02/22/2023    9:58 PM 11/21/2022    5:18 AM  CMP  Glucose 70 - 99 mg/dL  161  096   BUN 8 - 23 mg/dL  78  39   Creatinine 0.45 - 1.24 mg/dL  4.09  8.11   Sodium 914 - 145 mmol/L 137  137  133   Potassium 3.5 - 5.1 mmol/L 4.6  4.5  4.1   Chloride 98 - 111 mmol/L  100  101   CO2 22 - 32 mmol/L  25  24   Calcium 8.9 - 10.3 mg/dL  8.6  7.8   Total Protein 6.5 - 8.1 g/dL  5.6    Total Bilirubin 0.3 - 1.2 mg/dL  0.3    Alkaline Phos 38 - 126 U/L  112    AST 15 - 41 U/L  146    ALT 0 - 44 U/L  174         Latest Ref Rng & Units  02/22/2023    9:58 PM 11/17/2022   12:32 AM 11/16/2022    2:59 PM  Hepatic Function  Total Protein 6.5 - 8.1 g/dL 5.6  5.3  5.9   Albumin 3.5 - 5.0 g/dL 2.5  2.0  2.3   AST 15 - 41 U/L 146  16  16   ALT 0 - 44 U/L 174  22  26   Alk Phosphatase 38 - 126 U/L 112  74  83   Total Bilirubin 0.3 - 1.2 mg/dL 0.3  0.2  0.5        Latest Ref Rng & Units 02/22/2023   10:12 PM 02/22/2023    9:58 PM 11/21/2022    5:18 AM  CBC  WBC 4.0 - 10.5 K/uL  7.7  12.7   Hemoglobin 13.0 - 17.0 g/dL 7.8  7.4  7.5   Hematocrit 39.0 - 52.0 % 23.0  23.8  22.7   Platelets 150 - 400 K/uL  233  231      I reviewed available labs, medications, imaging, studies and related documents from the EMR.  Records/imaging reviewed and summarized above since last visit.   Physical Exam: GENERAL: NAD, mildly disheveled LUNGS: CTAB, no increased work of breathing, room air CARDIAC:  S1S2, RRR with no MRG, no edema/ cyanosis ABD:  hypo-active BS x 4 quads, soft, non-tender EXTREMITIES: right sided residual deficit with spasticity of RUE, No muscle atrophy/subcutaneous fat loss NEURO: No cognitive impairment at present time PSYCH:  Blunted, non-anxious affect, A & O x 3  Thank  you for the opportunity to participate in the care of Zakarias Shirazi. Please call our main office at [phone] if we can be of additional assistance.    Joycelyn Man FNP-C  [email or web address] .[facility]  Phone:  [phone]

## 2023-03-26 ENCOUNTER — Other Ambulatory Visit: Payer: Self-pay

## 2023-03-26 ENCOUNTER — Inpatient Hospital Stay (HOSPITAL_COMMUNITY)
Admission: EM | Admit: 2023-03-26 | Discharge: 2023-03-29 | DRG: 291 | Disposition: A | Discharge status: Other Acute Inpatient Hospital | Payer: Medicare Other | Source: Skilled Nursing Facility | Attending: Internal Medicine | Admitting: Internal Medicine

## 2023-03-26 ENCOUNTER — Encounter (HOSPITAL_COMMUNITY): Payer: Self-pay

## 2023-03-26 ENCOUNTER — Emergency Department (HOSPITAL_COMMUNITY): Payer: Medicare Other

## 2023-03-26 DIAGNOSIS — N3000 Acute cystitis without hematuria: Secondary | ICD-10-CM | POA: Diagnosis present

## 2023-03-26 DIAGNOSIS — Z8673 Personal history of transient ischemic attack (TIA), and cerebral infarction without residual deficits: Secondary | ICD-10-CM

## 2023-03-26 DIAGNOSIS — D649 Anemia, unspecified: Secondary | ICD-10-CM | POA: Diagnosis not present

## 2023-03-26 DIAGNOSIS — E118 Type 2 diabetes mellitus with unspecified complications: Secondary | ICD-10-CM | POA: Diagnosis not present

## 2023-03-26 DIAGNOSIS — L8990 Pressure ulcer of unspecified site, unspecified stage: Secondary | ICD-10-CM | POA: Diagnosis present

## 2023-03-26 DIAGNOSIS — D638 Anemia in other chronic diseases classified elsewhere: Secondary | ICD-10-CM | POA: Diagnosis present

## 2023-03-26 DIAGNOSIS — Z7902 Long term (current) use of antithrombotics/antiplatelets: Secondary | ICD-10-CM

## 2023-03-26 DIAGNOSIS — Z6823 Body mass index (BMI) 23.0-23.9, adult: Secondary | ICD-10-CM | POA: Diagnosis not present

## 2023-03-26 DIAGNOSIS — N184 Chronic kidney disease, stage 4 (severe): Secondary | ICD-10-CM | POA: Diagnosis present

## 2023-03-26 DIAGNOSIS — E1122 Type 2 diabetes mellitus with diabetic chronic kidney disease: Secondary | ICD-10-CM | POA: Diagnosis present

## 2023-03-26 DIAGNOSIS — I130 Hypertensive heart and chronic kidney disease with heart failure and stage 1 through stage 4 chronic kidney disease, or unspecified chronic kidney disease: Principal | ICD-10-CM | POA: Diagnosis present

## 2023-03-26 DIAGNOSIS — E860 Dehydration: Secondary | ICD-10-CM | POA: Diagnosis present

## 2023-03-26 DIAGNOSIS — F32A Depression, unspecified: Secondary | ICD-10-CM | POA: Diagnosis present

## 2023-03-26 DIAGNOSIS — R627 Adult failure to thrive: Secondary | ICD-10-CM | POA: Diagnosis present

## 2023-03-26 DIAGNOSIS — F17210 Nicotine dependence, cigarettes, uncomplicated: Secondary | ICD-10-CM | POA: Diagnosis present

## 2023-03-26 DIAGNOSIS — Z66 Do not resuscitate: Secondary | ICD-10-CM | POA: Diagnosis present

## 2023-03-26 DIAGNOSIS — E785 Hyperlipidemia, unspecified: Secondary | ICD-10-CM | POA: Diagnosis present

## 2023-03-26 DIAGNOSIS — F418 Other specified anxiety disorders: Secondary | ICD-10-CM | POA: Diagnosis present

## 2023-03-26 DIAGNOSIS — F0393 Unspecified dementia, unspecified severity, with mood disturbance: Secondary | ICD-10-CM | POA: Diagnosis present

## 2023-03-26 DIAGNOSIS — G9341 Metabolic encephalopathy: Secondary | ICD-10-CM | POA: Diagnosis present

## 2023-03-26 DIAGNOSIS — Z794 Long term (current) use of insulin: Secondary | ICD-10-CM

## 2023-03-26 DIAGNOSIS — L89152 Pressure ulcer of sacral region, stage 2: Secondary | ICD-10-CM | POA: Diagnosis present

## 2023-03-26 DIAGNOSIS — A0811 Acute gastroenteropathy due to Norwalk agent: Secondary | ICD-10-CM | POA: Diagnosis present

## 2023-03-26 DIAGNOSIS — Z7982 Long term (current) use of aspirin: Secondary | ICD-10-CM

## 2023-03-26 DIAGNOSIS — F0394 Unspecified dementia, unspecified severity, with anxiety: Secondary | ICD-10-CM | POA: Diagnosis present

## 2023-03-26 DIAGNOSIS — G40909 Epilepsy, unspecified, not intractable, without status epilepticus: Secondary | ICD-10-CM

## 2023-03-26 DIAGNOSIS — R1310 Dysphagia, unspecified: Secondary | ICD-10-CM

## 2023-03-26 DIAGNOSIS — Z79899 Other long term (current) drug therapy: Secondary | ICD-10-CM

## 2023-03-26 DIAGNOSIS — I10 Essential (primary) hypertension: Secondary | ICD-10-CM | POA: Diagnosis present

## 2023-03-26 DIAGNOSIS — D631 Anemia in chronic kidney disease: Secondary | ICD-10-CM | POA: Diagnosis present

## 2023-03-26 DIAGNOSIS — I6930 Unspecified sequelae of cerebral infarction: Secondary | ICD-10-CM | POA: Diagnosis not present

## 2023-03-26 DIAGNOSIS — Z888 Allergy status to other drugs, medicaments and biological substances status: Secondary | ICD-10-CM

## 2023-03-26 DIAGNOSIS — Z91041 Radiographic dye allergy status: Secondary | ICD-10-CM

## 2023-03-26 DIAGNOSIS — Z885 Allergy status to narcotic agent status: Secondary | ICD-10-CM

## 2023-03-26 DIAGNOSIS — I5032 Chronic diastolic (congestive) heart failure: Secondary | ICD-10-CM | POA: Diagnosis present

## 2023-03-26 DIAGNOSIS — E43 Unspecified severe protein-calorie malnutrition: Secondary | ICD-10-CM | POA: Diagnosis present

## 2023-03-26 DIAGNOSIS — Z833 Family history of diabetes mellitus: Secondary | ICD-10-CM

## 2023-03-26 DIAGNOSIS — G47419 Narcolepsy without cataplexy: Secondary | ICD-10-CM | POA: Diagnosis present

## 2023-03-26 DIAGNOSIS — Z8249 Family history of ischemic heart disease and other diseases of the circulatory system: Secondary | ICD-10-CM

## 2023-03-26 LAB — POC OCCULT BLOOD, ED: Fecal Occult Bld: POSITIVE — AB

## 2023-03-26 LAB — CBC WITH DIFFERENTIAL/PLATELET
Abs Immature Granulocytes: 0.09 10*3/uL — ABNORMAL HIGH (ref 0.00–0.07)
Basophils Absolute: 0.1 10*3/uL (ref 0.0–0.1)
Basophils Relative: 0 %
Eosinophils Absolute: 0 10*3/uL (ref 0.0–0.5)
Eosinophils Relative: 0 %
HCT: 21.1 % — ABNORMAL LOW (ref 39.0–52.0)
Hemoglobin: 6.7 g/dL — CL (ref 13.0–17.0)
Immature Granulocytes: 1 %
Lymphocytes Relative: 3 %
Lymphs Abs: 0.6 10*3/uL — ABNORMAL LOW (ref 0.7–4.0)
MCH: 31.5 pg (ref 26.0–34.0)
MCHC: 31.8 g/dL (ref 30.0–36.0)
MCV: 99.1 fL (ref 80.0–100.0)
Monocytes Absolute: 0.6 10*3/uL (ref 0.1–1.0)
Monocytes Relative: 4 %
Neutro Abs: 14.9 10*3/uL — ABNORMAL HIGH (ref 1.7–7.7)
Neutrophils Relative %: 92 %
Platelets: 193 10*3/uL (ref 150–400)
RBC: 2.13 MIL/uL — ABNORMAL LOW (ref 4.22–5.81)
RDW: 13.7 % (ref 11.5–15.5)
WBC: 16.3 10*3/uL — ABNORMAL HIGH (ref 4.0–10.5)
nRBC: 0 % (ref 0.0–0.2)

## 2023-03-26 LAB — COMPREHENSIVE METABOLIC PANEL
ALT: 98 U/L — ABNORMAL HIGH (ref 0–44)
AST: 70 U/L — ABNORMAL HIGH (ref 15–41)
Albumin: 2.5 g/dL — ABNORMAL LOW (ref 3.5–5.0)
Alkaline Phosphatase: 115 U/L (ref 38–126)
Anion gap: 11 (ref 5–15)
BUN: 104 mg/dL — ABNORMAL HIGH (ref 8–23)
CO2: 22 mmol/L (ref 22–32)
Calcium: 8.7 mg/dL — ABNORMAL LOW (ref 8.9–10.3)
Chloride: 100 mmol/L (ref 98–111)
Creatinine, Ser: 3.61 mg/dL — ABNORMAL HIGH (ref 0.61–1.24)
GFR, Estimated: 18 mL/min — ABNORMAL LOW (ref >60)
Glucose, Bld: 174 mg/dL — ABNORMAL HIGH (ref 70–99)
Potassium: 4.5 mmol/L (ref 3.5–5.1)
Sodium: 133 mmol/L — ABNORMAL LOW (ref 135–145)
Total Bilirubin: 0.4 mg/dL (ref 0.3–1.2)
Total Protein: 6.1 g/dL — ABNORMAL LOW (ref 6.5–8.1)

## 2023-03-26 LAB — URINALYSIS, ROUTINE W REFLEX MICROSCOPIC
Bilirubin Urine: NEGATIVE
Glucose, UA: NEGATIVE mg/dL
Hgb urine dipstick: NEGATIVE
Ketones, ur: NEGATIVE mg/dL
Nitrite: NEGATIVE
Protein, ur: 100 mg/dL — AB
Specific Gravity, Urine: 1.013 (ref 1.005–1.030)
WBC, UA: >50 WBC/hpf (ref 0–5)
pH: 5 (ref 5.0–8.0)

## 2023-03-26 LAB — TYPE AND SCREEN: Antibody Screen: NEGATIVE

## 2023-03-26 LAB — CBG MONITORING, ED: Glucose-Capillary: 166 mg/dL — ABNORMAL HIGH (ref 70–99)

## 2023-03-26 LAB — PREPARE RBC (CROSSMATCH)

## 2023-03-26 LAB — BPAM RBC: Unit Type and Rh: 5100

## 2023-03-26 LAB — OCCULT BLOOD X 1 CARD TO LAB, STOOL: Fecal Occult Bld: NEGATIVE

## 2023-03-26 LAB — LIPASE, BLOOD: Lipase: 22 U/L (ref 11–51)

## 2023-03-26 MED ORDER — ACETAMINOPHEN 500 MG PO TABS: 500.0000 mg | ORAL_TABLET | Freq: Four times a day (QID) | ORAL | Status: DC | PRN

## 2023-03-26 MED ORDER — MODAFINIL 200 MG PO TABS
100.0000 mg | ORAL_TABLET | Freq: Every day | ORAL | Status: DC
  Administered 2023-03-27 – 2023-03-29 (×3): 100 mg via ORAL
  Filled 2023-03-26 (×3): qty 1

## 2023-03-26 MED ORDER — SODIUM CHLORIDE 0.9% IV SOLUTION: Freq: Once | INTRAVENOUS | Status: AC

## 2023-03-26 MED ORDER — SODIUM CHLORIDE 0.9 % IV SOLN
1.0000 g | Freq: Once | INTRAVENOUS | Status: AC
  Administered 2023-03-26: 1 g via INTRAVENOUS
  Filled 2023-03-26: qty 10

## 2023-03-26 MED ORDER — LEVETIRACETAM 500 MG PO TABS
500.0000 mg | ORAL_TABLET | Freq: Two times a day (BID) | ORAL | Status: DC
  Administered 2023-03-26 – 2023-03-29 (×6): 500 mg via ORAL
  Filled 2023-03-26 (×6): qty 1

## 2023-03-26 MED ORDER — ATORVASTATIN CALCIUM 40 MG PO TABS
40.0000 mg | ORAL_TABLET | Freq: Every day | ORAL | Status: DC
  Administered 2023-03-26 – 2023-03-29 (×4): 40 mg via ORAL
  Filled 2023-03-26 (×4): qty 1

## 2023-03-26 MED ORDER — AMLODIPINE BESYLATE 5 MG PO TABS
10.0000 mg | ORAL_TABLET | Freq: Every day | ORAL | Status: DC
  Administered 2023-03-26: 10 mg via ORAL
  Filled 2023-03-26: qty 2

## 2023-03-26 MED ORDER — SODIUM CHLORIDE 0.9 % IV SOLN
2.0000 g | INTRAVENOUS | Status: DC
  Administered 2023-03-27 – 2023-03-28 (×2): 2 g via INTRAVENOUS
  Filled 2023-03-26 (×2): qty 20

## 2023-03-26 MED ORDER — CLONIDINE HCL 0.1 MG PO TABS
0.3000 mg | ORAL_TABLET | Freq: Three times a day (TID) | ORAL | Status: DC
  Administered 2023-03-26: 0.3 mg via ORAL
  Filled 2023-03-26: qty 3

## 2023-03-26 MED ORDER — SODIUM CHLORIDE 0.9 % IV BOLUS: 1000.0000 mL | Freq: Once | INTRAVENOUS | Status: DC

## 2023-03-26 MED ORDER — SERTRALINE HCL 50 MG PO TABS
50.0000 mg | ORAL_TABLET | Freq: Every day | ORAL | Status: DC
  Administered 2023-03-26 – 2023-03-29 (×4): 50 mg via ORAL
  Filled 2023-03-26 (×4): qty 1

## 2023-03-26 MED ORDER — MIRTAZAPINE 7.5 MG PO TABS
7.5000 mg | ORAL_TABLET | Freq: Every day | ORAL | Status: DC
  Administered 2023-03-26 – 2023-03-28 (×3): 7.5 mg via ORAL
  Filled 2023-03-26 (×3): qty 1

## 2023-03-26 MED ORDER — INSULIN ASPART 100 UNIT/ML IJ SOLN
0.0000 [IU] | Freq: Three times a day (TID) | INTRAMUSCULAR | Status: DC
  Administered 2023-03-27: 8 [IU] via SUBCUTANEOUS
  Administered 2023-03-27: 3 [IU] via SUBCUTANEOUS
  Administered 2023-03-28: 8 [IU] via SUBCUTANEOUS
  Administered 2023-03-28: 5 [IU] via SUBCUTANEOUS
  Administered 2023-03-28: 11 [IU] via SUBCUTANEOUS

## 2023-03-26 MED ORDER — DOXAZOSIN MESYLATE 2 MG PO TABS
2.0000 mg | ORAL_TABLET | Freq: Every day | ORAL | Status: DC
  Administered 2023-03-26: 2 mg via ORAL
  Filled 2023-03-26: qty 1

## 2023-03-26 MED ORDER — ISOSORBIDE DINITRATE 20 MG PO TABS
40.0000 mg | ORAL_TABLET | Freq: Three times a day (TID) | ORAL | Status: DC
  Administered 2023-03-26: 40 mg via ORAL
  Filled 2023-03-26: qty 2

## 2023-03-26 MED ORDER — HYDRALAZINE HCL 25 MG PO TABS
100.0000 mg | ORAL_TABLET | Freq: Three times a day (TID) | ORAL | Status: DC
  Administered 2023-03-26: 100 mg via ORAL
  Filled 2023-03-26: qty 4

## 2023-03-26 MED ORDER — CARVEDILOL 12.5 MG PO TABS: 25.0000 mg | ORAL_TABLET | Freq: Two times a day (BID) | ORAL | Status: DC

## 2023-03-26 NOTE — ED Provider Notes (Signed)
[place] EMERGENCY DEPARTMENT AT [facility] Provider Note   CSN: [id number] Arrival date & time: 03/26/23  1503     History  Chief Complaint  Patient presents with   Failure To Thrive    Manuel Kramer is a 67 y.o. male.  Patient sent here by nursing home for abnormal labs.  They were concerned about kidney function being 3.4 with a BUN of 95.  He has a history of CKD, diabetes, hypertension, dementia.  Patient can tell me his name that he has no pain.  Talked on the phone with the daughter which states that this is his baseline.  He has not had any fevers or chills.  As far as daughter knows she is not sure if he follows with a nephrologist.  He has not had any issues otherwise per daughter.  The history is provided by the patient.       Home Medications Prior to Admission medications   Medication Sig Start Date End Date Taking? Authorizing Provider  acetaminophen (TYLENOL) 500 MG tablet Take 1 tablet (500 mg total) by mouth every 6 (six) hours as needed. Patient taking differently: Take 500 mg by mouth every 6 (six) hours as needed for moderate pain. 08/22/16   Elpidio Anis, PA-C  acidophilus (RISAQUAD) CAPS capsule Take 2 capsules by mouth daily. Patient taking differently: Take 1 capsule by mouth in the morning and at bedtime. 11/22/22   Rolly Salter, MD  amLODipine (NORVASC) 10 MG tablet Take 1 tablet (10 mg total) by mouth daily. 01/05/17   Hoy Register, MD  aspirin EC 81 MG tablet Take 81 mg by mouth daily. Swallow whole.    [provider]  atorvastatin (LIPITOR) 40 MG tablet Take 1 tablet (40 mg total) by mouth daily. 12/06/21   Briant Cedar, MD  carvedilol (COREG) 25 MG tablet Take 1 tablet (25 mg total) by mouth 2 (two) times daily with a meal. 12/05/21   Briant Cedar, MD  cholecalciferol (VITAMIN D3) 25 MCG (1000 UNIT) tablet Take 1,000 Units by mouth daily.    [provider]  Cinnamon 500 MG capsule Take 500 mg by  mouth daily.    [provider]  cloNIDine (CATAPRES) 0.3 MG tablet Take 1 tablet (0.3 mg total) by mouth 3 (three) times daily. 02/02/22   Rai, Delene Ruffini, MD  clopidogrel (PLAVIX) 75 MG tablet Take 75 mg by mouth daily.    [provider]  doxazosin (CARDURA) 2 MG tablet Take 1 tablet (2 mg total) by mouth daily. 11/22/22   Rolly Salter, MD  ferrous sulfate 325 (65 FE) MG tablet Take 1 tablet (325 mg total) by mouth daily with breakfast. 11/22/22   Rolly Salter, MD  hydrALAZINE (APRESOLINE) 100 MG tablet Take 1 tablet (100 mg total) by mouth 3 (three) times daily. 11/21/22   Rolly Salter, MD  Infant Care Products [facility]) OINT Apply 1 application  topically in the morning and at bedtime. Apply topically to buttocks    [provider]  insulin aspart (NOVOLOG) 100 UNIT/ML injection Inject 3 Units into the skin 3 (three) times daily with meals. If eats more than 50% of meals Patient taking differently: Inject 2-11 Units into the skin 4 (four) times daily -  before meals and at bedtime.  S/S 101-150= 2U, 151-200= 3U, 201-250= 5U, 251-300= 7U, 301-350= 9U, >350= 11U 05/12/22   Burnadette Pop, MD  ipratropium (ATROVENT) 0.06 % nasal spray Place 2 sprays  into both nostrils 2 (two) times daily.    [provider]  isosorbide dinitrate (ISORDIL) 40 MG tablet Take 40 mg by mouth 3 (three) times daily. 11/06/22   [provider]  levETIRAcetam (KEPPRA) 500 MG tablet Take 1 tablet (500 mg total) by mouth 2 (two) times daily. 05/12/22   Burnadette Pop, MD  loratadine (CLARITIN) 10 MG tablet Take 10 mg by mouth every other day.    [provider]  magnesium oxide (MAG-OX) 400 MG tablet Take 1 tablet (400 mg total) by mouth daily. 11/21/22   Rolly Salter, MD  mirtazapine (REMERON) 7.5 MG tablet Take 7.5 mg by mouth at bedtime.    [provider]  modafinil (PROVIGIL) 100 MG tablet Take 1 tablet (100 mg total) by mouth daily. 11/21/22   Rolly Salter, MD  Multiple Vitamin (MULTIVITAMIN WITH MINERALS) TABS tablet Take 1 tablet by mouth daily. 12/06/21   Briant Cedar, MD  niacin 100 MG tablet Take 100 mg by mouth 2 (two) times daily.    [provider]  Nutritional Supplements (FEEDING SUPPLEMENT, NEPRO CARB STEADY,) LIQD Take 237 mLs by mouth 2 (two) times daily between meals. Patient not taking: Reported on 02/23/2023 11/21/22   Rolly Salter, MD  sertraline (ZOLOFT) 50 MG tablet Take 50 mg by mouth daily. 04/17/22   [provider]  sodium bicarbonate 650 MG tablet Take 1,300 mg by mouth 2 (two) times daily.    [provider]  spironolactone (ALDACTONE) 25 MG tablet Take 1 tablet (25 mg total) by mouth daily. 11/16/22   Pokhrel, Rebekah Chesterfield, MD  Torsemide 40 MG TABS Take 40 mg by mouth daily. 11/16/22   Pokhrel, Rebekah Chesterfield, MD      Allergies    Codeine, Iodinated contrast media, Pentazocine, and Statins    Review of Systems   Review of Systems  Physical Exam Updated Vital Signs  ED Triage Vitals  Enc Vitals Group     BP 03/26/23 1518 110/67     Pulse Rate 03/26/23 1518 (!) 55     Resp 03/26/23 1518 (!) 9     Temp 03/26/23 1518 (!) 95.9 F (35.5 C)     Temp Source 03/26/23 1518 Axillary     SpO2 03/26/23 1509 98 %     Weight --      Height --      Head Circumference --      Peak Flow --      Pain Score 03/26/23 1524 0     Pain Loc --      Pain Edu? --      Excl. in GC? --     Physical Exam Vitals and nursing note reviewed.  Constitutional:      General: He is not in acute distress.    Appearance: He is well-developed. He is not ill-appearing.  HENT:     Head: Normocephalic and atraumatic.     Mouth/Throat:     Mouth: Mucous membranes are moist.  Eyes:     Extraocular Movements: Extraocular movements intact.     Conjunctiva/sclera: Conjunctivae normal.     Pupils: Pupils are equal, round, and reactive to light.  Cardiovascular:     Rate and Rhythm: Normal rate and regular rhythm.      Pulses: Normal pulses.     Heart sounds: Normal heart sounds. No murmur heard. Pulmonary:     Effort: Pulmonary effort is normal. No respiratory distress.     Breath sounds:  Normal breath sounds.  Abdominal:     Palpations: Abdomen is soft.     Tenderness: There is no abdominal tenderness.  Musculoskeletal:        General: No swelling.     Cervical back: Normal range of motion and neck supple.     Comments: 1+ pitting edema in his legs bilaterally  Skin:    General: Skin is warm and dry.     Capillary Refill: Capillary refill takes less than 2 seconds.  Neurological:     Mental Status: He is alert. Mental status is at baseline.     Comments: Awake and alert.  Can tell me his name, answers simple questions  Psychiatric:        Mood and Affect: Mood normal.     ED Results / Procedures / Treatments   Labs (all labs ordered are listed, but only abnormal results are displayed) Labs Reviewed  CBC WITH DIFFERENTIAL/PLATELET - Abnormal; Notable for the following components:      Result Value   WBC 16.3 (*)    RBC 2.13 (*)    Hemoglobin 6.7 (*)    HCT 21.1 (*)    Neutro Abs 14.9 (*)    Lymphs Abs 0.6 (*)    Abs Immature Granulocytes 0.09 (*)    All other components within normal limits  COMPREHENSIVE METABOLIC PANEL - Abnormal; Notable for the following components:   Sodium 133 (*)    Glucose, Bld 174 (*)    BUN 104 (*)    Creatinine, Ser 3.61 (*)    Calcium 8.7 (*)    Total Protein 6.1 (*)    Albumin 2.5 (*)    AST 70 (*)    ALT 98 (*)    GFR, Estimated 18 (*)    All other components within normal limits  URINALYSIS, ROUTINE W REFLEX MICROSCOPIC - Abnormal; Notable for the following components:   APPearance HAZY (*)    Protein, ur 100 (*)    Leukocytes,Ua LARGE (*)    Bacteria, UA RARE (*)    All other components within normal limits  CBG MONITORING, ED - Abnormal; Notable for the following components:   Glucose-Capillary 166 (*)    All other components within  normal limits  URINE CULTURE  LIPASE, BLOOD  OCCULT BLOOD X 1 CARD TO LAB, STOOL  TYPE AND SCREEN  PREPARE RBC (CROSSMATCH)    EKG None  Radiology DG Chest Portable 1 View  Result Date: 03/26/2023 CLINICAL DATA:  Weakness. EXAM: PORTABLE CHEST 1 VIEW COMPARISON:  February 22, 2023 FINDINGS: Enlarged cardiac silhouette. Mild interstitial pulmonary edema.  No focal airspace consolidation. Sub pulmonic pleural effusions cannot be excluded. IMPRESSION: 1. Enlarged cardiac silhouette. 2. Mild interstitial pulmonary edema. 3. Sub pulmonic pleural effusions cannot be excluded. Electronically Signed   By: Ted Mcalpine M.D.   On: 03/26/2023 15:57    Procedures .Critical Care  Performed by: Virgina Norfolk, DO Authorized by: Virgina Norfolk, DO   Critical care provider statement:    Critical care time (minutes):  35   Critical care was necessary to treat or prevent imminent or life-threatening deterioration of the following conditions: hb 6.7, requiring blood transfusion.   Critical care was time spent personally by me on the following activities:  Blood draw for specimens, development of treatment plan with patient or surrogate, discussions with primary provider, evaluation of patient's response to treatment, examination of patient, gastric intubation, obtaining history from patient or surrogate, ordering and performing treatments and interventions, ordering  and review of laboratory studies, ordering and review of radiographic studies, pulse oximetry, re-evaluation of patient's condition and review of old charts   I assumed direction of critical care for this patient from another provider in my specialty: no       Medications Ordered in ED Medications  0.9 %  sodium chloride infusion (Manually program via Guardrails IV Fluids) (has no administration in time range)  cefTRIAXone (ROCEPHIN) 1 g in sodium chloride 0.9 % 100 mL IVPB (has no administration in time range)    ED Course/ Medical  Decision Making/ A&P                             Medical Decision Making Amount and/or Complexity of Data Reviewed Labs: ordered. Radiology: ordered.  Risk Prescription drug management. Decision regarding hospitalization.   GARRED ROQUES is here for evaluation of abnormal labs.  My review of lab work that was done at his facility today shows creatinine of 3.47 but lab work otherwise unremarkable.  Blood sugar was low but on recheck here it is normal.  He has normal vitals.  He appears to be at his neurologic baseline.  He is got some edema on his legs which appears to be chronic.  Patient has dementia, stroke, CKD.  When I review kidney function from the last 6 months that facility has been following his creatinine is mostly been between 3 and 3.4.  I do not think that this is a significant change in his kidney function.  He does not appear to be uremic.  He seems to be at his mental baseline.  Family is unaware if he is following with nephrology and ultimately will recheck labs here today and likely refer them to nephrology.  Will look to see if there is any infectious process going on as well.  Per my review and interpretation labs hemoglobin 6.7.  Which may be the reason for the elevated BUN however stool is grossly [place].  He is on Plavix.  Urinalysis somewhat equivocal for infection but does have a white count of 16.  Will send for urine culture and give a dose of Rocephin.  Creatinine 3.61, BUN 104.  Will type and transfuse him 1 unit of packed red blood cells.  Will admit to medicine.  This chart was dictated using voice recognition software.  Despite best efforts to proofread,  errors can occur which can change the documentation meaning.         Final Clinical Impression(s) / ED Diagnoses Final diagnoses:  Symptomatic anemia  Acute cystitis without hematuria    Rx / DC Orders ED Discharge Orders     None         Virgina Norfolk, DO 03/26/23 1716

## 2023-03-26 NOTE — ED Notes (Signed)
Nurse received pt resting in bed. Pt responds to name, pt can provide name and d.o.b unable to express pain, no abdominal guarding. No apparent nausea or vomiting. Skin is clean and dry and appropriate for race. Some edema in lower extremities. L leg +1, R led +2

## 2023-03-26 NOTE — H&P (Signed)
History and Physical    Patient: Manuel Kramer ZOX:[id number] DOB: August 26, 1956 DOA: 03/26/2023 DOS: the patient was seen and examined on 03/26/2023 PCP: Diamantina Providence, FNP  Patient coming from:  nursing home  Chief Complaint:  Chief Complaint  Patient presents with   Failure To Thrive   HPI: Manuel Kramer is a 67 y.o. male with medical history significant of ischemic stroke, diabetes, depression, anemia, CHF, hypertension, C. difficile, aspiration pneumonia, CKD presents from [place] nursing home with abnormal labs results.  The nursing home usually  monitors his labs every month. Today they were concerned about creatinine 3.4 and BUN 95. ER physician spoke to daughter today who confirmed his creatinine has been between 3-3.4 over the last 6 months.  Daughter confirmed that patient was at his baseline. Limited history from patient. He was able to tell me he has abdominal pain and dysuria which started today.   Labs  CBG 166, hemoglobin 6.7, WBC 16.3, sodium 133, creatinine 3.61, BUN 104, GFR 18 potassium 4.5, AST 70 ALT 90, lipase 22, FOBT positive, UA: Hazy, protein, large leuks, bacteria, urine culture pending  Imaging CXR: Enlarged cardiac silhouette. 2. Mild interstitial pulmonary edema. 3. Sub pulmonic pleural effusions cannot be excluded.  ED course Vital signs on admission: Temp 95.9, pulse 55, respiratory 18, BP 132/117, sats 90% on room air. Patient received 1 unit RBC and 1 g ceftriaxone in the ED.  Review of Systems: As mentioned in the history of present illness. All other systems reviewed and are negative. Past Medical History:  Diagnosis Date   Acute ischemic stroke (HCC) > small acute to subacute pontine infarct seen on MRI 05/10/2022   Acute metabolic encephalopathy 05/08/2022   Acute on chronic diastolic congestive heart failure (HCC) 11/08/2022   Acute renal failure superimposed on stage 3b chronic kidney disease (HCC) 01/25/2022   AKI (acute kidney  injury) (HCC)    Aspiration pneumonia (HCC) 05/08/2022   C. difficile colitis 01/26/2022   Chronic diarrhea    Diabetes mellitus without complication (HCC)    Hypertension    Hypertensive emergency 10/02/2014   Hypertensive emergency without congestive heart failure    Renal insufficiency 10/02/2014   Sepsis (HCC)    Sepsis due to undetermined organism (HCC) 11/08/2022   UTI (urinary tract infection) 11/16/2022   Past Surgical History:  Procedure Laterality Date   COLOSCOPY      POLYP   Social History:  reports that he has been smoking cigarettes. He has a 20.00 pack-year smoking history. He has quit using smokeless tobacco. He reports that he does not currently use alcohol after a past usage of about 1.0 standard drink of alcohol per week. He reports that he does not use drugs.  Allergies  Allergen Reactions   Codeine Other (See Comments)    On MAR, unknown reaction   Iodinated Contrast Media Other (See Comments)    On MAR, unknown reaction   Pentazocine Other (See Comments)    On MAR, unknown reaction **Talwin**   Statins Other (See Comments)    On MAR, unknown reaction    Family History  Problem Relation Age of Onset   Cancer Mother    Diabetes Mother    Hypertension Mother    Cancer Father     Prior to Admission medications   Medication Sig Start Date End Date Taking? Authorizing Provider  acetaminophen (TYLENOL) 500 MG tablet Take 1 tablet (500 mg total) by mouth every 6 (six) hours as needed. Patient taking differently:  Take 500 mg by mouth every 6 (six) hours as needed for moderate pain. 08/22/16   Elpidio Anis, PA-C  acidophilus (RISAQUAD) CAPS capsule Take 2 capsules by mouth daily. Patient taking differently: Take 1 capsule by mouth in the morning and at bedtime. 11/22/22   Rolly Salter, MD  amLODipine (NORVASC) 10 MG tablet Take 1 tablet (10 mg total) by mouth daily. 01/05/17   Hoy Register, MD  aspirin EC 81 MG tablet Take 81 mg by mouth daily. Swallow  whole.    [provider]  atorvastatin (LIPITOR) 40 MG tablet Take 1 tablet (40 mg total) by mouth daily. 12/06/21   Briant Cedar, MD  carvedilol (COREG) 25 MG tablet Take 1 tablet (25 mg total) by mouth 2 (two) times daily with a meal. 12/05/21   Briant Cedar, MD  cholecalciferol (VITAMIN D3) 25 MCG (1000 UNIT) tablet Take 1,000 Units by mouth daily.    [provider]  Cinnamon 500 MG capsule Take 500 mg by mouth daily.    [provider]  cloNIDine (CATAPRES) 0.3 MG tablet Take 1 tablet (0.3 mg total) by mouth 3 (three) times daily. 02/02/22   Rai, Delene Ruffini, MD  clopidogrel (PLAVIX) 75 MG tablet Take 75 mg by mouth daily.    [provider]  doxazosin (CARDURA) 2 MG tablet Take 1 tablet (2 mg total) by mouth daily. 11/22/22   Rolly Salter, MD  ferrous sulfate 325 (65 FE) MG tablet Take 1 tablet (325 mg total) by mouth daily with breakfast. 11/22/22   Rolly Salter, MD  hydrALAZINE (APRESOLINE) 100 MG tablet Take 1 tablet (100 mg total) by mouth 3 (three) times daily. 11/21/22   Rolly Salter, MD  Infant [employer] [facility]) OINT Apply 1 application  topically in the morning and at bedtime. Apply topically to buttocks    [provider]  insulin aspart (NOVOLOG) 100 UNIT/ML injection Inject 3 Units into the skin 3 (three) times daily with meals. If eats more than 50% of meals Patient taking differently: Inject 2-11 Units into the skin 4 (four) times daily -  before meals and at bedtime.  S/S 101-150= 2U, 151-200= 3U, 201-250= 5U, 251-300= 7U, 301-350= 9U, >350= 11U 05/12/22   Burnadette Pop, MD  ipratropium (ATROVENT) 0.06 % nasal spray Place 2 sprays into both nostrils 2 (two) times daily.    [provider]  isosorbide dinitrate (ISORDIL) 40 MG tablet Take 40 mg by mouth 3 (three) times daily. 11/06/22   [provider]  levETIRAcetam (KEPPRA) 500 MG tablet Take 1 tablet (500 mg total) by mouth 2 (two) times daily.  05/12/22   Burnadette Pop, MD  loratadine (CLARITIN) 10 MG tablet Take 10 mg by mouth every other day.    [provider]  magnesium oxide (MAG-OX) 400 MG tablet Take 1 tablet (400 mg total) by mouth daily. 11/21/22   Rolly Salter, MD  mirtazapine (REMERON) 7.5 MG tablet Take 7.5 mg by mouth at bedtime.    [provider]  modafinil (PROVIGIL) 100 MG tablet Take 1 tablet (100 mg total) by mouth daily. 11/21/22   Rolly Salter, MD  Multiple Vitamin (MULTIVITAMIN WITH MINERALS) TABS tablet Take 1 tablet by mouth daily. 12/06/21   Briant Cedar, MD  niacin 100 MG tablet Take 100 mg by mouth 2 (two) times daily.    [provider]  Nutritional Supplements (FEEDING SUPPLEMENT, NEPRO CARB STEADY,) LIQD Take 237 mLs by mouth  2 (two) times daily between meals. Patient not taking: Reported on 02/23/2023 11/21/22   Rolly Salter, MD  sertraline (ZOLOFT) 50 MG tablet Take 50 mg by mouth daily. 04/17/22   [provider]  sodium bicarbonate 650 MG tablet Take 1,300 mg by mouth 2 (two) times daily.    [provider]  spironolactone (ALDACTONE) 25 MG tablet Take 1 tablet (25 mg total) by mouth daily. 11/16/22   Pokhrel, Rebekah Chesterfield, MD  Torsemide 40 MG TABS Take 40 mg by mouth daily. 11/16/22   Joycelyn Das, MD    Physical Exam: Vitals:   03/26/23 1524 03/26/23 1630 03/26/23 1830 03/26/23 1900  BP:  (!) 132/117 112/60 110/65  Pulse:  (!) 54 61 63  Resp:  18    Temp:      TempSrc:      SpO2: 98% 100% 99% 100%   General: Alert, no acute distress, frail appearing 67 yr old male. Appears older than stated age Cardio: Normal S1 and S2, RRR, no r/m/g Pulm: few bibasilar crackles, normal work of breathing Abdomen: Bowel sounds normal. Abdomen soft and non-tender.  Extremities: bilateral 2 + pitting edema Neuro: Cranial nerves grossly intact, ano x 0   Data Reviewed:  There are no new results to review at this time.  Assessment and Plan: No notes have  been filed under this hospital service. Service: Hospitalist  Symptomatic anemia, possible GI bleed   Patient presented from nursing home with hemoglobin 6.7, baseline appears to be 7-9.  MCV 99. Likely secondary to anemia of chronic disease with possible underlying GI bleed as FOBT positive today.  Patient is on aspirin and Plavix for stroke in 2023.  Will hold both of these antiplatelets due to anemia and risk of GI bleed. -Admit to medical telemetry -Up with assistance -Vitals per floor routine -Transfusion threshold 7 -Transfuse 1 unit RBC -Monitor H&H post transfusion -GI consult am  -Hold aspirin and plavix  -SCDs for DVT prophylaxis -Consider goals of care discussion/palliative consult  -CBC and CMP a.m. 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  -Consider nephro consult am  -Avoid nephrotoxic agents -Daily CMP   Chronic diarrhea  history of C. difficile No known recent antibiotics. Stool have been green in color per RN. -Follow-up GI pathogen panel  Diabetes -Carb modified diet -CBGs 4 times a day with meals and at bedtime -Sliding scale insulin  History of CVA with residual deficit -Holding aspirin and Plavix in setting of possible GI bleed  Chronic lethargy  Narcolepsy -Continue Modanafil   Hypertension  Diastolic heart failure -Continue amlodipine, carvedilol, clonidine, hydralazine -Holding spironolactone, torsemide and ARB due to CKD  Depression/anxiety -Continue Zoloft  Seizures -Continue Keppra  Hyperlipidemia -Continue statin  Severe protein calorie malnutrition -Continue Remeron -Continue Modafinil   Advance Care Planning:   Code Status: Prior DNR according to nursing home paperwork at bedside   Consults:    Family Communication:   Severity of Illness: The appropriate patient status for this patient is INPATIENT. Inpatient status is judged to be reasonable and necessary in order to provide the required intensity of service to ensure the patient's safety. The patient's presenting symptoms, physical exam findings, and initial radiographic and laboratory data in the context of their chronic comorbidities  is felt to place them at high risk for further clinical deterioration. Furthermore, it is not anticipated that the patient will be medically stable for discharge from the hospital within 2 midnights of admission.   * I certify that at the point of admission it is my clinical judgment that the patient will require inpatient hospital care spanning beyond 2 midnights from the point of admission due to high intensity of service, high risk for further deterioration and high frequency of surveillance required.*  Author: Rolm Gala, MD 03/26/2023 7:33 PM  For on call review [email or web address].

## 2023-03-26 NOTE — ED Notes (Signed)
ED TO INPATIENT HANDOFF REPORT  ED Nurse Name and Phone #: Linus Orn Name/Age/Gender Manuel Kramer 67 y.o. male Room/Bed: WA17/WA17  Code Status   Code Status: DNR  Home/SNF/Other Nursing Home Patient oriented to: self Is this baseline? Yes   Triage Complete: Triage complete  Chief Complaint Symptomatic anemia [D64.9]  Triage Note Arrived via EMS from [facility] for failure to thrive and abnormal labs. Hx of dementia orientated to self which baseline per EMS.    Allergies Allergies  Allergen Reactions   Codeine Other (See Comments)    On MAR, unknown reaction   Iodinated Contrast Media Other (See Comments)    On MAR, unknown reaction   Pentazocine Other (See Comments)    On MAR, unknown reaction **Talwin**   Statins Other (See Comments)    On MAR, unknown reaction    Level of Care/Admitting Diagnosis ED Disposition     ED Disposition  Admit   Condition  --   Comment  Hospital [facility] [[id number]]  Level of Care: Telemetry [5]  Admit to tele based on following criteria: Other see comments  Comments: CKD  May [facility] patient to [facility] if equivalent level of care is available:: Yes  Covid Evaluation: Asymptomatic - no recent exposure (last 10 days) testing not required  Diagnosis: Symptomatic anemia [1610960]  Admitting Physician: Sherlyn Lick [[id number]]  Attending Physician: Sherlyn Lick [[id number]]  Certification:: I certify this patient will need inpatient services for at least 2 midnights  Estimated Length of Stay: 2          B Medical/Surgery History Past Medical History:  Diagnosis Date   Acute ischemic stroke (HCC) > small acute to subacute pontine infarct seen on MRI 05/10/2022   Acute metabolic encephalopathy 05/08/2022   Acute on chronic diastolic congestive heart failure (HCC) 11/08/2022   Acute renal failure superimposed on stage 3b chronic kidney disease (HCC) 01/25/2022   AKI  (acute kidney injury) (HCC)    Aspiration pneumonia (HCC) 05/08/2022   C. difficile colitis 01/26/2022   Chronic diarrhea    Diabetes mellitus without complication (HCC)    Hypertension    Hypertensive emergency 10/02/2014   Hypertensive emergency without congestive heart failure    Renal insufficiency 10/02/2014   Sepsis (HCC)    Sepsis due to undetermined organism (HCC) 11/08/2022   UTI (urinary tract infection) 11/16/2022   Past Surgical History:  Procedure Laterality Date   COLOSCOPY      POLYP     A IV Location/Drains/Wounds Patient Lines/Drains/Airways Status     Active Line/Drains/Airways     Name Placement date Placement time Site Days   Peripheral IV 03/26/23 20 G 1" Left;Posterior Forearm 03/26/23  1757  Forearm  less than 1   Midline Single Lumen 02/22/23 Right Basilic 02/22/23  2206  Basilic  32   Pressure Injury 05/07/22 Coccyx Medial;Left;Right Stage 2 -  Partial thickness loss of dermis presenting as a shallow open injury with a red, pink wound bed without slough. small area of open skin 05/07/22  2200  -- 323   Wound / Incision (Open or Dehisced) 01/27/22 (MASD) Moisture Associated Skin Damage Sacrum Left small area of open skin 01/27/22  2300  Sacrum  423            Intake/Output Last 24 hours No intake or output data in the 24 hours ending 03/26/23 2107  Labs/Imaging Results for orders placed or performed during the hospital encounter  of 03/26/23 (from the past 48 hour(s))  POC CBG, ED     Status: Abnormal   Collection Time: 03/26/23  3:25 PM  Result Value Ref Range   Glucose-Capillary 166 (H) 70 - 99 mg/dL    Comment: Glucose reference range applies only to samples taken after fasting for at least 8 hours.  CBC with Differential     Status: Abnormal   Collection Time: 03/26/23  4:15 PM  Result Value Ref Range   WBC 16.3 (H) 4.0 - 10.5 K/uL   RBC 2.13 (L) 4.22 - 5.81 MIL/uL   Hemoglobin 6.7 (LL) 13.0 - 17.0 g/dL    Comment: This critical result  has verified and been called to PATTY DOWD, RN by Pattricia Boss on 05 27 2024 at 1711, and has been read back. CRITICAL RESULTS CALLED AND VERIFIED   HCT 21.1 (L) 39.0 - 52.0 %   MCV 99.1 80.0 - 100.0 fL   MCH 31.5 26.0 - 34.0 pg   MCHC 31.8 30.0 - 36.0 g/dL   RDW 16.1 09.6 - 04.5 %   Platelets 193 150 - 400 K/uL   nRBC 0.0 0.0 - 0.2 %   Neutrophils Relative % 92 %   Neutro Abs 14.9 (H) 1.7 - 7.7 K/uL   Lymphocytes Relative 3 %   Lymphs Abs 0.6 (L) 0.7 - 4.0 K/uL   Monocytes Relative 4 %   Monocytes Absolute 0.6 0.1 - 1.0 K/uL   Eosinophils Relative 0 %   Eosinophils Absolute 0.0 0.0 - 0.5 K/uL   Basophils Relative 0 %   Basophils Absolute 0.1 0.0 - 0.1 K/uL   Immature Granulocytes 1 %   Abs Immature Granulocytes 0.09 (H) 0.00 - 0.07 K/uL    Comment: Performed at [facility], [address] [place], [place] [address]  Comprehensive metabolic panel     Status: Abnormal   Collection Time: 03/26/23  4:15 PM  Result Value Ref Range   Sodium 133 (L) 135 - 145 mmol/L   Potassium 4.5 3.5 - 5.1 mmol/L   Chloride 100 98 - 111 mmol/L   CO2 22 22 - 32 mmol/L   Glucose, Bld 174 (H) 70 - 99 mg/dL    Comment: Glucose reference range applies only to samples taken after fasting for at least 8 hours.   BUN 104 (H) 8 - 23 mg/dL    Comment: RESULT CONFIRMED BY MANUAL DILUTION   Creatinine, Ser 3.61 (H) 0.61 - 1.24 mg/dL   Calcium 8.7 (L) 8.9 - 10.3 mg/dL   Total Protein 6.1 (L) 6.5 - 8.1 g/dL   Albumin 2.5 (L) 3.5 - 5.0 g/dL   AST 70 (H) 15 - 41 U/L   ALT 98 (H) 0 - 44 U/L   Alkaline Phosphatase 115 38 - 126 U/L   Total Bilirubin 0.4 0.3 - 1.2 mg/dL   GFR, Estimated 18 (L) >60 mL/min    Comment: (NOTE) Calculated using the CKD-EPI Creatinine Equation (2021)    Anion gap 11 5 - 15    Comment: Performed at [facility], [address], [place], [place] [address]  Lipase, blood     Status: None   Collection Time: 03/26/23  4:15 PM  Result Value Ref  Range   Lipase 22 11 - 51 U/L    Comment: Performed at [facility], [address], [place], [place] [address]  Urinalysis, Routine w reflex microscopic -Urine, Clean Catch     Status: Abnormal  Collection Time: 03/26/23  4:31 PM  Result Value Ref Range   Color, Urine YELLOW YELLOW   APPearance HAZY (A) CLEAR   Specific Gravity, Urine 1.013 1.005 - 1.030   pH 5.0 5.0 - 8.0   Glucose, UA NEGATIVE NEGATIVE mg/dL   Hgb urine dipstick NEGATIVE NEGATIVE   Bilirubin Urine NEGATIVE NEGATIVE   Ketones, ur NEGATIVE NEGATIVE mg/dL   Protein, ur 161 (A) NEGATIVE mg/dL   Nitrite NEGATIVE NEGATIVE   Leukocytes,Ua LARGE (A) NEGATIVE   RBC / HPF 0-5 0 - 5 RBC/hpf   WBC, UA >50 0 - 5 WBC/hpf   Bacteria, UA RARE (A) NONE SEEN   Squamous Epithelial / HPF 0-5 0 - 5 /HPF   Mucus PRESENT    Budding Yeast PRESENT    Hyaline Casts, UA PRESENT     Comment: Performed at [facility], [address], [place], [place] [address]  POC occult blood, ED     Status: Abnormal   Collection Time: 03/26/23  5:37 PM  Result Value Ref Range   Fecal Occult Bld POSITIVE (A) NEGATIVE  Type and screen [facility]     Status: None (Preliminary result)   Collection Time: 03/26/23  5:51 PM  Result Value Ref Range   ABO/RH(D) O POS    Antibody Screen NEG    Sample Expiration 03/29/2023,2359    Unit Number [id number]    Blood Component Type RED CELLS,LR    Unit division 00    Status of Unit ISSUED    Transfusion Status OK TO TRANSFUSE    Crossmatch Result      Compatible Performed at [facility], [address], [place], [place] [address]   Prepare RBC (crossmatch)     Status: None   Collection Time: 03/26/23  6:00 PM  Result Value Ref Range   Order Confirmation      ORDER PROCESSED BY BLOOD BANK Performed at [facility], [address] [place], [place] [address]   Occult blood card to lab, stool     Status:  None   Collection Time: 03/26/23  7:50 PM  Result Value Ref Range   Fecal Occult Bld NEGATIVE NEGATIVE    Comment: Performed at [facility], [address], [place], [place] [address]   DG Chest Portable 1 View  Result Date: 03/26/2023 CLINICAL DATA:  Weakness. EXAM: PORTABLE CHEST 1 VIEW COMPARISON:  February 22, 2023 FINDINGS: Enlarged cardiac silhouette. Mild interstitial pulmonary edema.  No focal airspace consolidation. Sub pulmonic pleural effusions cannot be excluded. IMPRESSION: 1. Enlarged cardiac silhouette. 2. Mild interstitial pulmonary edema. 3. Sub pulmonic pleural effusions cannot be excluded. Electronically Signed   By: Ted Mcalpine M.D.   On: 03/26/2023 15:57    Pending Labs Unresulted Labs (From admission, onward)     Start     Ordered   03/27/23 0500  Comprehensive metabolic panel  Tomorrow morning,   R        03/26/23 2059   03/27/23 0500  CBC  Tomorrow morning,   R        03/26/23 2059   03/26/23 1957  Gastrointestinal Panel by PCR , Stool  (Gastrointestinal Panel by PCR, Stool                                                                                                                                                     **  Does Not include CLOSTRIDIUM DIFFICILE testing. **If CDIFF testing is needed, place order from the "C Difficile Testing" order set.**)  Once,   R        03/26/23 1956   03/26/23 1716  Urine Culture  Once,   URGENT       Question:  Indication  Answer:  Dysuria   03/26/23 1715            Vitals/Pain Today's Vitals   03/26/23 1946 03/26/23 2011 03/26/23 2025 03/26/23 2026  BP:  119/67 (!) 122/58 (!) 122/58  Pulse:  67 64 64  Resp:  20 20 20   Temp: 97.9 F (36.6 C) 97.9 F (36.6 C)  98.1 F (36.7 C)  TempSrc: Rectal   Oral  SpO2:   100% 100%  PainSc:        Isolation Precautions Enteric precautions (UV disinfection)  Medications Medications  acetaminophen (TYLENOL) tablet 500 mg (has no administration in  time range)  amLODipine (NORVASC) tablet 10 mg (has no administration in time range)  atorvastatin (LIPITOR) tablet 40 mg (has no administration in time range)  levETIRAcetam (KEPPRA) tablet 500 mg (has no administration in time range)  sertraline (ZOLOFT) tablet 50 mg (has no administration in time range)  modafinil (PROVIGIL) tablet 100 mg (has no administration in time range)  mirtazapine (REMERON) tablet 7.5 mg (has no administration in time range)  isosorbide dinitrate (ISORDIL) tablet 40 mg (has no administration in time range)  hydrALAZINE (APRESOLINE) tablet 100 mg (has no administration in time range)  doxazosin (CARDURA) tablet 2 mg (has no administration in time range)  cloNIDine (CATAPRES) tablet 0.3 mg (has no administration in time range)  carvedilol (COREG) tablet 25 mg (has no administration in time range)  cefTRIAXone (ROCEPHIN) 2 g in sodium chloride 0.9 % 100 mL IVPB (has no administration in time range)  insulin aspart (novoLOG) injection 0-15 Units (has no administration in time range)  0.9 %  sodium chloride infusion (Manually program via Guardrails IV Fluids) ( Intravenous New Bag/Given 03/26/23 1802)  cefTRIAXone (ROCEPHIN) 1 g in sodium chloride 0.9 % 100 mL IVPB (0 g Intravenous Stopped 03/26/23 1933)    Mobility non-ambulatory     Focused Assessments Cardiac Assessment Handoff:    Lab Results  Component Value Date   CKTOTAL 21 10/24/2007   CKMB 4.0 10/24/2007   TROPONINI 0.05        NO INDICATION OF MYOCARDIAL INJURY. 10/24/2007   Lab Results  Component Value Date   DDIMER 2.04 (H) 01/26/2022   Does the Patient currently have chest pain?  Pt has dementia an unable to articulate pain   R Recommendations: See Admitting Provider Note  Report given to:   Additional Notes: Pt does not ambulate, patient orientated to self. Arrived via EMS from [place].

## 2023-03-26 NOTE — ED Notes (Addendum)
Consent to obtain blood completed by 2 nurse verification, spoke with daughter, Nishant Howorth, who consents to pt receiving blood, consent obtained by this Clinical research associate and charge nurse, Jeanice Lim

## 2023-03-26 NOTE — ED Triage Notes (Addendum)
Arrived via EMS from [facility] for failure to thrive and abnormal labs. Hx of dementia orientated to self which baseline per EMS.

## 2023-03-27 ENCOUNTER — Other Ambulatory Visit: Payer: Self-pay

## 2023-03-27 DIAGNOSIS — A0811 Acute gastroenteropathy due to Norwalk agent: Secondary | ICD-10-CM | POA: Diagnosis not present

## 2023-03-27 DIAGNOSIS — D649 Anemia, unspecified: Secondary | ICD-10-CM

## 2023-03-27 DIAGNOSIS — N184 Chronic kidney disease, stage 4 (severe): Secondary | ICD-10-CM

## 2023-03-27 DIAGNOSIS — N3000 Acute cystitis without hematuria: Secondary | ICD-10-CM

## 2023-03-27 LAB — BPAM RBC
Blood Product Expiration Date: 202406302359
ISSUE DATE / TIME: 202405271954

## 2023-03-27 LAB — GASTROINTESTINAL PANEL BY PCR, STOOL (REPLACES STOOL CULTURE)

## 2023-03-27 LAB — CBC
HCT: 22.5 % — ABNORMAL LOW (ref 39.0–52.0)
Hemoglobin: 7.3 g/dL — ABNORMAL LOW (ref 13.0–17.0)
MCH: 31.1 pg (ref 26.0–34.0)
MCHC: 32.4 g/dL (ref 30.0–36.0)
MCV: 95.7 fL (ref 80.0–100.0)
Platelets: 187 10*3/uL (ref 150–400)
RBC: 2.35 MIL/uL — ABNORMAL LOW (ref 4.22–5.81)
RDW: 14.9 % (ref 11.5–15.5)
WBC: 15.9 10*3/uL — ABNORMAL HIGH (ref 4.0–10.5)
nRBC: 0.4 % — ABNORMAL HIGH (ref 0.0–0.2)

## 2023-03-27 LAB — COMPREHENSIVE METABOLIC PANEL
ALT: 79 U/L — ABNORMAL HIGH (ref 0–44)
AST: 40 U/L (ref 15–41)
Albumin: 2.3 g/dL — ABNORMAL LOW (ref 3.5–5.0)
Alkaline Phosphatase: 109 U/L (ref 38–126)
Anion gap: 10 (ref 5–15)
BUN: 102 mg/dL — ABNORMAL HIGH (ref 8–23)
CO2: 21 mmol/L — ABNORMAL LOW (ref 22–32)
Calcium: 8.3 mg/dL — ABNORMAL LOW (ref 8.9–10.3)
Chloride: 103 mmol/L (ref 98–111)
Creatinine, Ser: 3.7 mg/dL — ABNORMAL HIGH (ref 0.61–1.24)
GFR, Estimated: 17 mL/min — ABNORMAL LOW (ref >60)
Glucose, Bld: 93 mg/dL (ref 70–99)
Potassium: 4.4 mmol/L (ref 3.5–5.1)
Sodium: 134 mmol/L — ABNORMAL LOW (ref 135–145)
Total Bilirubin: 0.9 mg/dL (ref 0.3–1.2)
Total Protein: 5.8 g/dL — ABNORMAL LOW (ref 6.5–8.1)

## 2023-03-27 LAB — OSMOLALITY, URINE: Osmolality, Ur: 359 mOsm/kg (ref 300–900)

## 2023-03-27 LAB — GLUCOSE, CAPILLARY
Glucose-Capillary: 117 mg/dL — ABNORMAL HIGH (ref 70–99)
Glucose-Capillary: 167 mg/dL — ABNORMAL HIGH (ref 70–99)
Glucose-Capillary: 236 mg/dL — ABNORMAL HIGH (ref 70–99)
Glucose-Capillary: 251 mg/dL — ABNORMAL HIGH (ref 70–99)
Glucose-Capillary: 76 mg/dL (ref 70–99)

## 2023-03-27 LAB — TYPE AND SCREEN
ABO/RH(D): O POS
Unit division: 0

## 2023-03-27 LAB — OSMOLALITY: Osmolality: 323 mOsm/kg (ref 275–295)

## 2023-03-27 LAB — BRAIN NATRIURETIC PEPTIDE: B Natriuretic Peptide: 1218.1 pg/mL — ABNORMAL HIGH (ref 0.0–100.0)

## 2023-03-27 LAB — SODIUM, URINE, RANDOM: Sodium, Ur: <10 mmol/L

## 2023-03-27 MED ORDER — HYDRALAZINE HCL 25 MG PO TABS
25.0000 mg | ORAL_TABLET | Freq: Three times a day (TID) | ORAL | Status: DC
  Administered 2023-03-27 – 2023-03-29 (×6): 25 mg via ORAL
  Filled 2023-03-27 (×7): qty 1

## 2023-03-27 MED ORDER — AMLODIPINE BESYLATE 5 MG PO TABS
2.5000 mg | ORAL_TABLET | Freq: Every day | ORAL | Status: DC
  Administered 2023-03-27 – 2023-03-28 (×2): 2.5 mg via ORAL
  Filled 2023-03-27 (×2): qty 1

## 2023-03-27 MED ORDER — ORAL CARE MOUTH RINSE
15.0000 mL | OROMUCOSAL | Status: DC
  Administered 2023-03-27 – 2023-03-29 (×8): 15 mL via OROMUCOSAL

## 2023-03-27 MED ORDER — LACTATED RINGERS IV SOLN: INTRAVENOUS | Status: DC

## 2023-03-27 MED ORDER — CLONIDINE HCL 0.1 MG PO TABS
0.1000 mg | ORAL_TABLET | Freq: Three times a day (TID) | ORAL | Status: DC
  Administered 2023-03-27 – 2023-03-29 (×6): 0.1 mg via ORAL
  Filled 2023-03-27 (×7): qty 1

## 2023-03-27 MED ORDER — ORAL CARE MOUTH RINSE: 15.0000 mL | OROMUCOSAL | Status: DC | PRN

## 2023-03-27 NOTE — Assessment & Plan Note (Signed)
-   UA consistent with infection; patient too frail and possibly encephalopathic to provide collateral; probable that he was also incontinent of stool in bed from norovirus as well - plan on 3 day min course of Rocephin - follow up urine culture

## 2023-03-27 NOTE — Assessment & Plan Note (Signed)
-   Continue mirtazapine and Zoloft

## 2023-03-27 NOTE — Assessment & Plan Note (Addendum)
-   Patient on large amounts of antihypertensives with soft blood pressure on admission and worsened renal function (at risk for hypoperfusion) - Also mildly bradycardic - Multiple antihypertensives adjusted today and may need further adjustment and continued at discharge

## 2023-03-27 NOTE — Assessment & Plan Note (Signed)
-   No signs or symptoms of volume overload

## 2023-03-27 NOTE — Assessment & Plan Note (Signed)
-   Continue SSI and CBG monitoring ?

## 2023-03-27 NOTE — Assessment & Plan Note (Signed)
-   Continue diet - Follow-up SLP eval

## 2023-03-27 NOTE — Assessment & Plan Note (Signed)
-   see symptomatic anemia

## 2023-03-27 NOTE — TOC Initial Note (Signed)
Transition of Care [facility]) - Initial/Assessment Note    Patient Details  Name: Manuel Kramer MRN: [id number] Date of Birth: [birth year]  [facility] of [facility]) CM/SW Contact:    Manuel Clam, RN Phone Number: 03/27/2023, 12:32 PM  Clinical Narrative:  Dementia.Spoke to dtr Manuel Kramer(dtr) & Manuel Kramer rep Manuel Kramer-d/c plan to return back to Manuel Kramer-LTC. Await medical stability. PTAR @ d/c.                 Expected Discharge Plan: Long Term Nursing Home Barriers to Discharge: Continued Medical Work up   Patient Goals and CMS Choice Patient states their goals for this hospitalization and ongoing recovery are:: Return back to [facility] LTC CMS Medicare.gov Compare Post Acute Care list provided to:: Patient Represenative (must comment) (Manuel Kramer(dtr)) Choice offered to / list presented to : Adult Children [place] ownership interest in [facility].provided to:: Adult Children    Expected Discharge Plan and Services   Discharge Planning Services: CM Consult Post Acute Care Choice: Nursing Home Living arrangements for the past 2 months:  (LTC)                                      Prior Living Arrangements/Services Living arrangements for the past 2 months:  (LTC) Lives with:: Facility Resident Patient language and need for interpreter reviewed:: Yes Do you feel safe going back to the place where you live?: Yes      Need for Family Participation in Patient Care: Yes (Comment) Care giver support system in place?: Yes (comment)   Criminal Activity/Legal Involvement Pertinent to Current Situation/Hospitalization: No - Comment as needed  Activities of Daily Living Home Assistive Devices/Equipment: Other (Comment) ADL Screening (condition at time of admission) Patient's cognitive ability adequate to safely complete daily activities?: No Is the patient deaf or have difficulty hearing?: No Does the patient have difficulty seeing, even when wearing  glasses/contacts?: Yes Does the patient have difficulty concentrating, remembering, or making decisions?: Yes Patient able to express need for assistance with ADLs?: No Does the patient have difficulty dressing or bathing?: Yes Independently performs ADLs?: No Communication: Independent Is this a change from baseline?: Pre-admission baseline Dressing (OT): Dependent Is this a change from baseline?: Pre-admission baseline Grooming: Dependent Is this a change from baseline?: Pre-admission baseline Feeding: Dependent Is this a change from baseline?: Pre-admission baseline Bathing: Dependent Is this a change from baseline?: Pre-admission baseline Toileting: Dependent Is this a change from baseline?: Pre-admission baseline In/Out Bed: Needs assistance Is this a change from baseline?: Pre-admission baseline Walks in Home: Dependent Does the patient have difficulty walking or climbing stairs?: Yes Weakness of Legs: Both Weakness of Arms/Hands: Both  Permission Sought/Granted Permission sought to share information with : Case Manager Permission granted to share information with : Yes, Verbal Permission Granted  Share Information with NAME: Case manager           Emotional Assessment Appearance:: Appears stated age Attitude/Demeanor/Rapport: Gracious Affect (typically observed): Accepting Orientation: : Oriented to Self Alcohol / Substance Use: Not Applicable Psych Involvement: No (comment)  Admission diagnosis:  Acute cystitis without hematuria [N30.00] Symptomatic anemia [D64.9] Patient Active Problem List   Diagnosis Date Noted   Gastroenteritis due to norovirus 03/27/2023   Acute cystitis 03/27/2023   Symptomatic anemia 03/26/2023   Dysphagia 11/29/2022   Palliative care encounter 11/29/2022   CKD (chronic kidney disease) stage 4, GFR 15-29 ml/min (HCC) 11/29/2022  Hypoglycemia 11/08/2022   Essential hypertension 11/08/2022   History of CVA (cerebrovascular accident)  11/08/2022   Seizure disorder (HCC) 05/09/2022   Pressure injury of skin 05/08/2022   Bilateral lower extremity edema 01/25/2022   Type 2 diabetes mellitus with complication, with long-term current use of insulin (HCC) 01/25/2022   Depression with anxiety 01/25/2022   Physical deconditioning 01/25/2022   Left hip pain 01/25/2022   Anemia of chronic disease 01/25/2022   Chronic diastolic CHF (congestive heart failure) (HCC) 01/25/2022   Cerebral embolism with cerebral infarction 11/17/2021   Protein-calorie malnutrition, severe 11/16/2021   Hyperglycemia    Hyperlipidemia 05/22/2016   Diabetic neuropathy (HCC) 05/22/2016   Other specified diabetes mellitus without complications (HCC) 10/02/2014   Smoking 10/02/2014   Family history of prostate cancer 10/02/2014   PCP:  Manuel Providence, Manuel Kramer Pharmacy:   [facility]- [place], [place] - [address] [place] [address] Phone: [phone] Fax: [phone]  [facility] - [place], [place] - [address] [place] [address] Phone: [phone] Fax: [phone]     Social Determinants of Health (SDOH) Social History: SDOH Screenings   Food Insecurity: Patient Unable To Answer (03/27/2023)  Housing: High Risk (03/27/2023)  Transportation Needs: Patient Unable To Answer (03/27/2023)  Utilities: Patient Unable To Answer (03/27/2023)  Tobacco Use: High Risk (03/26/2023)   SDOH Interventions:     Readmission Risk Interventions    11/21/2022   10:50 AM  Readmission Risk Prevention Plan  Transportation Screening Complete  PCP or Specialist Appt within 3-5 Days Complete  HRI or Home Care Consult Not Complete  Palliative Care Screening Not Applicable  Medication Review (RN Care Manager) Complete

## 2023-03-27 NOTE — Assessment & Plan Note (Signed)
Continue Keppra.

## 2023-03-27 NOTE — Assessment & Plan Note (Addendum)
-   may be causing the worsened renal function due to GI losses -Leukocytosis also expected in setting function - continue supportive care and enteric precautions - IVF for now

## 2023-03-27 NOTE — Progress Notes (Addendum)
Progress Note    Manuel Kramer   UYQ:[id number]  DOB: Jul 17, 1956  DOA: 03/26/2023     1 PCP: Diamantina Providence, FNP  Initial CC: abnormal labs  Hospital Course: Manuel Kramer is a 67 yo male with PMH ischemic stroke, diabetes, depression, anemia, CHF, hypertension, C. difficile, aspiration pneumonia, CKD who presented from [facility] with abnormal labs results. His renal function has been trended and there was concern about his creatinine and BUN being elevated.  However, his daughter was contacted after admission and confirmed his creatinine had been around 3-3.4 over the past several months. Patient was unable to provide collateral information due to underlying poor mentation.  This was considered baseline per his daughter on admission as well.  Lab workup was notable for creatinine 3.61, BUN 104.  WBC 16.3, hemoglobin 6.7 g/dL.  Initially FOBT was positive but on repeat turned negative.  He was transfused 1 unit PRBC.  There was also complaints of some diarrhea and he underwent stool testing which was positive for norovirus.   Interval History:  Chronically ill-appearing and debilitated man lying in bed.  He is unable to answer hardly any questions and mostly says yes.  He was able to tell me his name when asked.  Could follow some commands.  Assessment and Plan: * Symptomatic anemia - Baseline hemoglobin is already around 7 to 8 g/dL and on admission was not too far off from baseline.  Etiology may be due to his worsened renal function causing worsened hemoglobin however there was the positive FOBT which was negative on repeat. - favoring worsened renal function as etiology at this time along with norovirus diarrhea which may have caused some irritation/inflammation enough to turn FOBT positive (plus blood thinners on board) - trend H/H for now and transfuse further if needed; I think can hold off on GI consult for now unless Hgb keeps downtrending  Gastroenteritis due  to norovirus - may be causing the worsened renal function due to GI losses -Leukocytosis also expected in setting function - continue supportive care and enteric precautions - IVF for now  Acute cystitis - UA consistent with infection; patient too frail and possibly encephalopathic to provide collateral; probable that he was also incontinent of stool in bed from norovirus as well - plan on 3 day min course of Rocephin - follow up urine culture   CKD (chronic kidney disease) stage 4, GFR 15-29 ml/min (HCC) - patient has history of CKD4. Baseline creat ~ 3.2 - 3.4, eGFR~ 19-25 - patient presents with increase in creat >0.3 mg/dL above baseline, creat increase >1.5x baseline presumed to have occurred within past 7 days PTA - slight worsening from baseline but BUN is up which theoretically could also be from bleeding but still need to trend data more - check FeUrea  - continue IVF - trend BMP   Essential hypertension - Patient on large amounts of antihypertensives with soft blood pressure on admission and worsened renal function (at risk for hypoperfusion) - Also mildly bradycardic - Multiple antihypertensives adjusted today and may need further adjustment and continued at discharge  Anemia of chronic disease - see symptomatic anemia  History of CVA (cerebrovascular accident) - Resides in long-term care at [facility].  He is total assistance for mobility.  Evaluated by PT with no further needs due to total dependence - Continue modafinil  Seizure disorder (HCC) - Continue Keppra  Type 2 diabetes mellitus with complication, with long-term current use of insulin (HCC) Continue SSI and  CBG monitoring  Depression with anxiety - Continue mirtazapine and Zoloft  Hyperlipidemia - Continue Lipitor  Dysphagia - Very lethargic appearing this morning - SLP eval requested  Chronic diastolic CHF (congestive heart failure) (HCC) - No signs or symptoms of volume  overload  Protein-calorie malnutrition, severe - Continue diet - Follow-up SLP eval   Old records reviewed in assessment of this patient  Antimicrobials:   DVT prophylaxis:  SCDs Start: 03/26/23 2059   Code Status:   Code Status: DNR  Mobility Assessment (last 72 hours)     Mobility Assessment     Row Name 03/27/23 1055 03/27/23 0927 03/26/23 2300       Does patient have an order for bedrest or is patient medically unstable -- No - Continue assessment No - Continue assessment     What is the highest level of mobility based on the progressive mobility assessment? Level 1 (Bedfast) - Unable to balance while sitting on edge of bed Level 1 (Bedfast) - Unable to balance while sitting on edge of bed Level 1 (Bedfast) - Unable to balance while sitting on edge of bed     Is the above level different from baseline mobility prior to current illness? -- Yes - Recommend PT order Yes - Recommend PT order              Barriers to discharge: none Disposition Plan:  [facility] Status is: Inpt  Objective: Blood pressure 123/65, pulse 60, temperature 97.8 F (36.6 C), temperature source Oral, resp. rate 18, height 5\' 4"  (1.626 m), weight 62.4 kg, SpO2 95 %.  Examination:  Physical Exam Constitutional:      Comments: Chronically ill-appearing elderly gentleman laying in bed in no distress  HENT:     Head: Normocephalic and atraumatic.     Mouth/Throat:     Mouth: Mucous membranes are moist.  Eyes:     Extraocular Movements: Extraocular movements intact.  Cardiovascular:     Rate and Rhythm: Normal rate and regular rhythm.  Pulmonary:     Effort: Pulmonary effort is normal. No respiratory distress.     Breath sounds: Normal breath sounds. No wheezing.  Abdominal:     General: Bowel sounds are normal. There is no distension.     Palpations: Abdomen is soft.     Tenderness: There is no abdominal tenderness.  Musculoskeletal:        General: No swelling. Normal range of  motion.     Cervical back: Normal range of motion and neck supple.  Skin:    General: Skin is warm and dry.  Neurological:     Mental Status: He is disoriented.     Motor: Weakness present.      Consultants:    Procedures:    Data Reviewed: Results for orders placed or performed during the hospital encounter of 03/26/23 (from the past 24 hour(s))  POC CBG, ED     Status: Abnormal   Collection Time: 03/26/23  3:25 PM  Result Value Ref Range   Glucose-Capillary 166 (H) 70 - 99 mg/dL  CBC with Differential     Status: Abnormal   Collection Time: 03/26/23  4:15 PM  Result Value Ref Range   WBC 16.3 (H) 4.0 - 10.5 K/uL   RBC 2.13 (L) 4.22 - 5.81 MIL/uL   Hemoglobin 6.7 (LL) 13.0 - 17.0 g/dL   HCT 40.9 (L) 81.1 - 91.4 %   MCV 99.1 80.0 - 100.0 fL   MCH 31.5 26.0 - 34.0 pg  MCHC 31.8 30.0 - 36.0 g/dL   RDW 91.4 78.2 - 95.6 %   Platelets 193 150 - 400 K/uL   nRBC 0.0 0.0 - 0.2 %   Neutrophils Relative % 92 %   Neutro Abs 14.9 (H) 1.7 - 7.7 K/uL   Lymphocytes Relative 3 %   Lymphs Abs 0.6 (L) 0.7 - 4.0 K/uL   Monocytes Relative 4 %   Monocytes Absolute 0.6 0.1 - 1.0 K/uL   Eosinophils Relative 0 %   Eosinophils Absolute 0.0 0.0 - 0.5 K/uL   Basophils Relative 0 %   Basophils Absolute 0.1 0.0 - 0.1 K/uL   Immature Granulocytes 1 %   Abs Immature Granulocytes 0.09 (H) 0.00 - 0.07 K/uL  Comprehensive metabolic panel     Status: Abnormal   Collection Time: 03/26/23  4:15 PM  Result Value Ref Range   Sodium 133 (L) 135 - 145 mmol/L   Potassium 4.5 3.5 - 5.1 mmol/L   Chloride 100 98 - 111 mmol/L   CO2 22 22 - 32 mmol/L   Glucose, Bld 174 (H) 70 - 99 mg/dL   BUN 213 (H) 8 - 23 mg/dL   Creatinine, Ser 0.86 (H) 0.61 - 1.24 mg/dL   Calcium 8.7 (L) 8.9 - 10.3 mg/dL   Total Protein 6.1 (L) 6.5 - 8.1 g/dL   Albumin 2.5 (L) 3.5 - 5.0 g/dL   AST 70 (H) 15 - 41 U/L   ALT 98 (H) 0 - 44 U/L   Alkaline Phosphatase 115 38 - 126 U/L   Total Bilirubin 0.4 0.3 - 1.2 mg/dL   GFR,  Estimated 18 (L) >60 mL/min   Anion gap 11 5 - 15  Lipase, blood     Status: None   Collection Time: 03/26/23  4:15 PM  Result Value Ref Range   Lipase 22 11 - 51 U/L  Urinalysis, Routine w reflex microscopic -Urine, Clean Catch     Status: Abnormal   Collection Time: 03/26/23  4:31 PM  Result Value Ref Range   Color, Urine YELLOW YELLOW   APPearance HAZY (A) CLEAR   Specific Gravity, Urine 1.013 1.005 - 1.030   pH 5.0 5.0 - 8.0   Glucose, UA NEGATIVE NEGATIVE mg/dL   Hgb urine dipstick NEGATIVE NEGATIVE   Bilirubin Urine NEGATIVE NEGATIVE   Ketones, ur NEGATIVE NEGATIVE mg/dL   Protein, ur 578 (A) NEGATIVE mg/dL   Nitrite NEGATIVE NEGATIVE   Leukocytes,Ua LARGE (A) NEGATIVE   RBC / HPF 0-5 0 - 5 RBC/hpf   WBC, UA >50 0 - 5 WBC/hpf   Bacteria, UA RARE (A) NONE SEEN   Squamous Epithelial / HPF 0-5 0 - 5 /HPF   Mucus PRESENT    Budding Yeast PRESENT    Hyaline Casts, UA PRESENT   POC occult blood, ED     Status: Abnormal   Collection Time: 03/26/23  5:37 PM  Result Value Ref Range   Fecal Occult Bld POSITIVE (A) NEGATIVE  Type and screen [facility]     Status: None   Collection Time: 03/26/23  5:51 PM  Result Value Ref Range   ABO/RH(D) O POS    Antibody Screen NEG    Sample Expiration 03/29/2023,2359    Unit Number [id number]    Blood Component Type RED CELLS,LR    Unit division 00    Status of Unit ISSUED,FINAL    Transfusion Status OK TO TRANSFUSE    Crossmatch Result  Compatible Performed at [facility], [address] [place], [place] [address]   Prepare RBC (crossmatch)     Status: None   Collection Time: 03/26/23  6:00 PM  Result Value Ref Range   Order Confirmation      ORDER PROCESSED BY BLOOD BANK Performed at [facility], [address] [place], [place] [address]   Occult blood card to lab, stool     Status: None   Collection Time: 03/26/23  7:50 PM  Result Value Ref Range   Fecal  Occult Bld NEGATIVE NEGATIVE  Gastrointestinal Panel by PCR , Stool     Status: Abnormal   Collection Time: 03/26/23  7:57 PM   Specimen: Stool  Result Value Ref Range   Campylobacter species NOT DETECTED NOT DETECTED   Plesimonas shigelloides NOT DETECTED NOT DETECTED   Salmonella species NOT DETECTED NOT DETECTED   Yersinia enterocolitica NOT DETECTED NOT DETECTED   Vibrio species NOT DETECTED NOT DETECTED   Vibrio cholerae NOT DETECTED NOT DETECTED   Enteroaggregative E coli (EAEC) NOT DETECTED NOT DETECTED   Enteropathogenic E coli (EPEC) NOT DETECTED NOT DETECTED   Enterotoxigenic E coli (ETEC) NOT DETECTED NOT DETECTED   Shiga like toxin producing E coli (STEC) NOT DETECTED NOT DETECTED   Shigella/Enteroinvasive E coli (EIEC) NOT DETECTED NOT DETECTED   Cryptosporidium NOT DETECTED NOT DETECTED   Cyclospora cayetanensis NOT DETECTED NOT DETECTED   Entamoeba histolytica NOT DETECTED NOT DETECTED   Giardia lamblia NOT DETECTED NOT DETECTED   Adenovirus F40/41 NOT DETECTED NOT DETECTED   Astrovirus NOT DETECTED NOT DETECTED   Norovirus GI/GII DETECTED (A) NOT DETECTED   Rotavirus A NOT DETECTED NOT DETECTED   Sapovirus (I, II, IV, and V) NOT DETECTED NOT DETECTED  Glucose, capillary     Status: None   Collection Time: 03/27/23 12:23 AM  Result Value Ref Range   Glucose-Capillary 76 70 - 99 mg/dL  Comprehensive metabolic panel     Status: Abnormal   Collection Time: 03/27/23  5:14 AM  Result Value Ref Range   Sodium 134 (L) 135 - 145 mmol/L   Potassium 4.4 3.5 - 5.1 mmol/L   Chloride 103 98 - 111 mmol/L   CO2 21 (L) 22 - 32 mmol/L   Glucose, Bld 93 70 - 99 mg/dL   BUN 098 (H) 8 - 23 mg/dL   Creatinine, Ser 1.19 (H) 0.61 - 1.24 mg/dL   Calcium 8.3 (L) 8.9 - 10.3 mg/dL   Total Protein 5.8 (L) 6.5 - 8.1 g/dL   Albumin 2.3 (L) 3.5 - 5.0 g/dL   AST 40 15 - 41 U/L   ALT 79 (H) 0 - 44 U/L   Alkaline Phosphatase 109 38 - 126 U/L   Total Bilirubin 0.9 0.3 - 1.2 mg/dL   GFR,  Estimated 17 (L) >60 mL/min   Anion gap 10 5 - 15  CBC     Status: Abnormal   Collection Time: 03/27/23  5:14 AM  Result Value Ref Range   WBC 15.9 (H) 4.0 - 10.5 K/uL   RBC 2.35 (L) 4.22 - 5.81 MIL/uL   Hemoglobin 7.3 (L) 13.0 - 17.0 g/dL   HCT 14.7 (L) 82.9 - 56.2 %   MCV 95.7 80.0 - 100.0 fL   MCH 31.1 26.0 - 34.0 pg   MCHC 32.4 30.0 - 36.0 g/dL   RDW 13.0 86.5 - 78.4 %   Platelets 187 150 - 400 K/uL   nRBC 0.4 (  H) 0.0 - 0.2 %  Brain natriuretic peptide     Status: Abnormal   Collection Time: 03/27/23  5:15 AM  Result Value Ref Range   B Natriuretic Peptide 1,218.1 (H) 0.0 - 100.0 pg/mL  Sodium, urine, random     Status: None   Collection Time: 03/27/23  8:00 AM  Result Value Ref Range   Sodium, Ur <10 mmol/L  Glucose, capillary     Status: Abnormal   Collection Time: 03/27/23  8:11 AM  Result Value Ref Range   Glucose-Capillary 117 (H) 70 - 99 mg/dL    I have reviewed pertinent nursing notes, vitals, labs, and images as necessary. I have ordered labwork to follow up on as indicated.  I have reviewed the last notes from staff over past 24 hours. I have discussed patient's care plan and test results with nursing staff, CM/SW, and other staff as appropriate.  Time spent: Greater than 50% of the 55 minute visit was spent in counseling/coordination of care for the patient as laid out in the A&P.   LOS: 1 day   Lewie Chamber, MD [facility] 03/27/2023, 11:46 AM

## 2023-03-27 NOTE — Assessment & Plan Note (Signed)
-   patient has history of CKD4. Baseline creat ~ 3.2 - 3.4, eGFR~ 19-25 - patient presents with increase in creat >0.3 mg/dL above baseline, creat increase >1.5x baseline presumed to have occurred within past 7 days PTA - slight worsening from baseline but BUN is up which theoretically could also be from bleeding but still need to trend data more - check FeUrea  - continue IVF - trend BMP

## 2023-03-27 NOTE — Assessment & Plan Note (Signed)
-  Continue Lipitor °

## 2023-03-27 NOTE — Hospital Course (Signed)
Manuel Kramer is a 67 yo male with PMH ischemic stroke, diabetes, depression, anemia, CHF, hypertension, C. difficile, aspiration pneumonia, CKD who presented from [facility] nursing [facility] with abnormal labs results. His renal function has been trended and there was concern about his creatinine and BUN being elevated.  However, his daughter was contacted after admission and confirmed his creatinine had been around 3-3.4 over the past several months. Patient was unable to provide collateral information due to underlying poor mentation.  This was considered baseline per his daughter on admission as well.  Lab workup was notable for creatinine 3.61, BUN 104.  WBC 16.3, hemoglobin 6.7 g/dL.  Initially FOBT was positive but on repeat turned negative.  He was transfused 1 unit PRBC.  There was also complaints of some diarrhea and he underwent stool testing which was positive for norovirus.

## 2023-03-27 NOTE — Evaluation (Signed)
Physical Therapy Evaluation Patient Details Name: Manuel Kramer MRN: [id number] DOB: Oct 29, 1956 Today's Date: 03/27/2023  History of Present Illness  Pt is a 67 y/o male presenting on 5/27 with abnormal labs from SNF.  Admitted for failure to thrive. Possible underlying GI bleed, likely UTI. PMH includes: CKD, DM, HTN, dementia, anemia, CHF, aspiration PNA, cdiff, CVA.  Clinical Impression  The patient is Long term care resident of  [name], requires total assistance  for mobility. No further acute PT needs identified. PT will sign off.       Recommendations for follow up therapy are one component of a multi-disciplinary discharge planning process, led by the attending physician.  Recommendations may be updated based on patient status, additional functional criteria and insurance authorization.  Follow Up Recommendations Can patient physically be transported by private vehicle: No     Assistance Recommended at Discharge Frequent or constant Supervision/Assistance  Patient can return home with the following  Two people to help with walking and/or transfers;Assistance with feeding;Two people to help with bathing/dressing/bathroom    Equipment Recommendations None recommended by PT  Recommendations for Other Services       Functional Status Assessment Patient has not had a recent decline in their functional status     Precautions / Restrictions Precautions Precautions: Fall Restrictions Weight Bearing Restrictions: No      Mobility  Bed Mobility                    Transfers                        Ambulation/Gait                  Stairs            Wheelchair Mobility    Modified Rankin (Stroke Patients Only)       Balance                                             Pertinent Vitals/Pain Pain Assessment Faces Pain Scale: No hurt    Home Living Family/patient expects to be discharged to:: Skilled  nursing facility                   Additional Comments: LTC at [facility]    Prior Function Prior Level of Function : Needs assist;Patient poor historian/Family not available             Mobility Comments: pt not reporting but anticipate bed/chair bound due to suspected contractures of LEs ADLs Comments: pt appears to need assist for ADls     Hand Dominance   Dominant Hand: Right    Extremity/Trunk Assessment   Upper Extremity Assessment Upper Extremity Assessment: Defer to OT evaluation    Lower Extremity Assessment Lower Extremity Assessment: RLE deficits/detail;LLE deficits/detail RLE Deficits / Details: noted  knee flexion contractures, reallt does not move legs, somewhat windswept posture, prefers right side. LLE Deficits / Details: same as right    Cervical / Trunk Assessment Cervical / Trunk Assessment: Other exceptions  Communication   Communication: Other (comment);Receptive difficulties;Expressive difficulties  Cognition Arousal/Alertness: Lethargic, Awake/alert Behavior During Therapy: Flat affect Overall Cognitive Status: History of cognitive impairments - at baseline  General Comments: pt lethargic upon entry, alert with engagement. No verbalizations but nodding head  yes/no.  anticipate near baseline with hx of dementia.        General Comments General comments (skin integrity, edema, etc.): RN notified of difficutly with chewing banana, requested SLP order.    Exercises     Assessment/Plan    PT Assessment Patient does not need any further PT services  PT Problem List         PT Treatment Interventions      PT Goals (Current goals can be found in the Care Plan section)  Acute Rehab PT Goals PT Goal Formulation: Patient unable to participate in goal setting    Frequency       Co-evaluation PT/OT/SLP Co-Evaluation/Treatment: Yes Reason for Co-Treatment: For patient/therapist  safety PT goals addressed during session: Mobility/safety with mobility OT goals addressed during session: ADL's and self-care       AM-PAC PT "6 Clicks" Mobility  Outcome Measure Help needed turning from your back to your side while in a flat bed without using bedrails?: Total Help needed moving from lying on your back to sitting on the side of a flat bed without using bedrails?: Total Help needed moving to and from a bed to a chair (including a wheelchair)?: Total Help needed standing up from a chair using your arms (e.g., wheelchair or bedside chair)?: Total Help needed to walk in hospital room?: Total Help needed climbing 3-5 steps with a railing? : Total 6 Click Score: 6    End of Session   Activity Tolerance: Patient tolerated treatment well Patient left: in bed Nurse Communication: Mobility status;Need for lift equipment PT Visit Diagnosis: Muscle weakness (generalized) (M62.81);Adult, failure to thrive (R62.7)    Time: 0830-0900 PT Time Calculation (min) (ACUTE ONLY): 30 min   Charges:   PT Evaluation $PT Eval Low Complexity: 1 Low          Blanchard Kelch PT Acute Rehabilitation Services Office [phone] Weekend pager-[phone]   Rada Hay 03/27/2023, 10:57 AM

## 2023-03-27 NOTE — Evaluation (Signed)
Occupational Therapy Evaluation Patient Details Name: Manuel Kramer MRN: [id number] DOB: 1956/04/05 Today's Date: 03/27/2023   History of Present Illness Pt is a 67 y/o male presenting on 5/27 with abnormal labs from SNF.  Admitted for failure to thrive. Possible underlying GI bleed, likely UTI. PMH includes: CKD, DM, HTN, dementia, anemia, CHF, aspiration PNA, cdiff, CVA.   Clinical Impression   Patient admitted for above.  Per chart review, pt from [facility] and anticipate he was needing total assist for ADLs and mobility.  Pt with generalized weakness, impaired balance and poor cognition/communication.  RN notified for difficulty with eating (1 small bite of banana), and SLP ordered.  Anticipate pt is at his baseline, and no further acute OT needs have been identified.  OT will sign off, defer further needs to SNF.      Recommendations for follow up therapy are one component of a multi-disciplinary discharge planning process, led by the attending physician.  Recommendations may be updated based on patient status, additional functional criteria and insurance authorization.   Assistance Recommended at Discharge Frequent or constant Supervision/Assistance  Patient can return home with the following Other (comment) (total care)    Functional Status Assessment     Equipment Recommendations  None recommended by OT    Recommendations for Other Services       Precautions / Restrictions Precautions Precautions: Fall Restrictions Weight Bearing Restrictions: No      Mobility Bed Mobility Overal bed mobility: Needs Assistance Bed Mobility: Supine to Sit, Sit to Supine     Supine to sit: Total assist, +2 for physical assistance, +2 for safety/equipment Sit to supine: Total assist, +2 for physical assistance, +2 for safety/equipment        Transfers                   General transfer comment: deferred      Balance Overall balance assessment: Needs  assistance Sitting-balance support: Feet supported, Single extremity supported, Bilateral upper extremity supported Sitting balance-Leahy Scale: Zero Sitting balance - Comments: R lateral and posterior lean with max-total assist required to maintain upright position at EOB                                   ADL either performed or assessed with clinical judgement   ADL Overall ADL's : Needs assistance/impaired;At baseline                                     Functional mobility during ADLs: Total assistance General ADL Comments: total assist +2 for all self care     Vision         Perception     Praxis      Pertinent Vitals/Pain Pain Assessment Pain Assessment: Faces Faces Pain Scale: No hurt     Hand Dominance     Extremity/Trunk Assessment Upper Extremity Assessment Upper Extremity Assessment: Generalized weakness;Difficult to assess due to impaired cognition   Lower Extremity Assessment Lower Extremity Assessment: Defer to PT evaluation       Communication Communication Communication: Other (comment);Receptive difficulties;Expressive difficulties (pt nodding head during session, no verbalizations)   Cognition Arousal/Alertness: Lethargic, Awake/alert Behavior During Therapy: Flat affect Overall Cognitive Status: History of cognitive impairments - at baseline  General Comments: pt lethargic upon entry, alert with engagement. No verbalizations but nodding head  yes/no.  anticipate near baseline with hx of dementia.     General Comments  RN notified of difficutly with chewing banana, requested SLP order.    Exercises     Shoulder Instructions      Home Living Family/patient expects to be discharged to:: Skilled nursing facility                                 Additional Comments: LTC at [name]      Prior Functioning/Environment Prior Level of Function : Needs  assist;Patient poor historian/Family not available             Mobility Comments: pt not reporting but anticipate bed/chair bound due to suspected contractures of LEs ADLs Comments: pt appears to need assist for ADls        OT Problem List: Decreased strength;Decreased activity tolerance;Impaired balance (sitting and/or standing);Decreased range of motion;Decreased coordination;Decreased cognition      OT Treatment/Interventions:      OT Goals(Current goals can be found in the care plan section)    OT Frequency:      Co-evaluation              AM-PAC OT "6 Clicks" Daily Activity     Outcome Measure Help from another person eating meals?: Total Help from another person taking care of personal grooming?: Total Help from another person toileting, which includes using toliet, bedpan, or urinal?: Total Help from another person bathing (including washing, rinsing, drying)?: Total Help from another person to put on and taking off regular upper body clothing?: Total Help from another person to put on and taking off regular lower body clothing?: Total 6 Click Score: 6   End of Session Nurse Communication: Mobility status;Other (comment);Precautions (eating difficulties)  Activity Tolerance: Patient tolerated treatment well Patient left: with call bell/phone within reach;in bed;with bed alarm set;with SCD's reapplied  OT Visit Diagnosis: Muscle weakness (generalized) (M62.81);Other abnormalities of gait and mobility (R26.89)                Time: 1610-9604 OT Time Calculation (min): 24 min Charges:  OT General Charges $OT Visit: 1 Visit OT Evaluation $OT Eval Moderate Complexity: 1 Mod  Manuel Kramer, OT Acute Rehabilitation Services Office [phone]   Manuel Kramer 03/27/2023, 10:24 AM

## 2023-03-27 NOTE — Progress Notes (Signed)
Noted that patient had not voided during shift.  Bladder prominent on assessment and patient voices having the urge to void but unable to, bladder scan shows 515cc.. MD made aware.  Orders received for I&O cath - 600cc of clear yellow urine emptied.

## 2023-03-27 NOTE — [place] FL2 (Signed)
[place] MEDICAID FL2 LEVEL OF CARE FORM     IDENTIFICATION  Patient Name: Manuel Kramer Birthdate: 12-30-1955 Sex: male Admission Date (Current Location): 03/26/2023  [place] and [place] Number:  Haynes Bast [id number] Facility and Address:  [facility],  [address] [place] [address]      Provider Number: [id number]  Attending Physician Name and Address:  Lewie Chamber, MD  Relative Name and Phone Number:  [name] Bushart(dtr)[phone]    Current Level of Care: Hospital Recommended Level of Care: Other (Comment), Nursing Facility Prior Approval Number:    Date Approved/Denied:   PASRR Number:    Discharge Plan: Other (Comment) (LTC)    Current Diagnoses: Patient Active Problem List   Diagnosis Date Noted   Gastroenteritis due to norovirus 03/27/2023   Acute cystitis 03/27/2023   Symptomatic anemia 03/26/2023   Dysphagia 11/29/2022   Palliative care encounter 11/29/2022   CKD (chronic kidney disease) stage 4, GFR 15-29 ml/min (HCC) 11/29/2022   Hypoglycemia 11/08/2022   Essential hypertension 11/08/2022   History of CVA (cerebrovascular accident) 11/08/2022   Seizure disorder (HCC) 05/09/2022   Pressure injury of skin 05/08/2022   Bilateral lower extremity edema 01/25/2022   Type 2 diabetes mellitus with complication, with long-term current use of insulin (HCC) 01/25/2022   Depression with anxiety 01/25/2022   Physical deconditioning 01/25/2022   Left hip pain 01/25/2022   Anemia of chronic disease 01/25/2022   Chronic diastolic CHF (congestive heart failure) (HCC) 01/25/2022   Cerebral embolism with cerebral infarction 11/17/2021   Protein-calorie malnutrition, severe 11/16/2021   Hyperglycemia    Hyperlipidemia 05/22/2016   Diabetic neuropathy (HCC) 05/22/2016   Other specified diabetes mellitus without complications (HCC) 10/02/2014   Smoking 10/02/2014   Family history of prostate cancer 10/02/2014    Orientation RESPIRATION  BLADDER Height & Weight     Self  Normal Incontinent Weight: 62.4 kg Height:  5\' 4"  (162.6 cm)  BEHAVIORAL SYMPTOMS/MOOD NEUROLOGICAL BOWEL NUTRITION STATUS      Incontinent Diet (CHO MOD)  AMBULATORY STATUS COMMUNICATION OF NEEDS Skin   Total Care Verbally Normal                       Personal Care Assistance Level of Assistance  Bathing, Feeding, Dressing, Total care Bathing Assistance: Maximum assistance Feeding assistance: Maximum assistance   Total Care Assistance: Maximum assistance   Functional Limitations Info  Sight, Hearing, Speech   Hearing Info: Adequate Speech Info: Adequate    SPECIAL CARE FACTORS FREQUENCY                       Contractures Contractures Info: Not present    Additional Factors Info  Code Status, Allergies Code Status Info: DNR Allergies Info: Codeine, Iodinated Contrast Media, Pentazocine, Statins           Current Medications (03/27/2023):  This is the current hospital active medication list Current Facility-Administered Medications  Medication Dose Route Frequency Provider Last Rate Last Admin   acetaminophen (TYLENOL) tablet 500 mg  500 mg Oral Q6H PRN Sherlyn Lick, MD       amLODipine (NORVASC) tablet 2.5 mg  2.5 mg Oral Daily Lewie Chamber, MD   2.5 mg at 03/27/23 0917   atorvastatin (LIPITOR) tablet 40 mg  40 mg Oral Daily Sherlyn Lick, MD   40 mg at 03/27/23 0918   cefTRIAXone (ROCEPHIN) 2 g in sodium chloride 0.9 % 100 mL IVPB  2 g Intravenous Q24H Sherlyn Lick, MD       cloNIDine (CATAPRES) tablet 0.1 mg  0.1 mg Oral TID Lewie Chamber, MD   0.1 mg at 03/27/23 1610   hydrALAZINE (APRESOLINE) tablet 25 mg  25 mg Oral TID Lewie Chamber, MD   25 mg at 03/27/23 0916   insulin aspart (novoLOG) injection 0-15 Units  0-15 Units Subcutaneous TID WC Sherlyn Lick, MD       lactated ringers infusion   Intravenous Continuous Lewie Chamber, MD 100 mL/hr at 03/27/23 0914 New Bag at 03/27/23 0914    levETIRAcetam (KEPPRA) tablet 500 mg  500 mg Oral BID Sherlyn Lick, MD   500 mg at 03/27/23 9604   mirtazapine (REMERON) tablet 7.5 mg  7.5 mg Oral QHS Sherlyn Lick, MD   7.5 mg at 03/26/23 2246   modafinil (PROVIGIL) tablet 100 mg  100 mg Oral Daily Sherlyn Lick, MD       Oral care mouth rinse  15 mL Mouth Rinse 4 times per day Sherlyn Lick, MD   15 mL at 03/27/23 0930   Oral care mouth rinse  15 mL Mouth Rinse PRN Sherlyn Lick, MD       sertraline (ZOLOFT) tablet 50 mg  50 mg Oral Daily Sherlyn Lick, MD   50 mg at 03/27/23 5409     Discharge Medications: Please see discharge summary for a list of discharge medications.  Relevant Imaging Results:  Relevant Lab Results:   Additional Information SS#[phone]  Thailan Sava, Olegario Messier, RN

## 2023-03-27 NOTE — Assessment & Plan Note (Signed)
-   Resides in long-term care at [facility].  He is total assistance for mobility.  Evaluated by PT with no further needs due to total dependence - Continue modafinil

## 2023-03-27 NOTE — Assessment & Plan Note (Signed)
-   Very lethargic appearing this morning - SLP eval requested

## 2023-03-27 NOTE — Assessment & Plan Note (Signed)
-   Baseline hemoglobin is already around 7 to 8 g/dL and on admission was not too far off from baseline.  Etiology may be due to his worsened renal function causing worsened hemoglobin however there was the positive FOBT which was negative on repeat. - favoring worsened renal function as etiology at this time along with norovirus diarrhea which may have caused some irritation/inflammation enough to turn FOBT positive (plus blood thinners on board) - trend H/H for now and transfuse further if needed; I think can hold off on GI consult for now unless Hgb keeps downtrending

## 2023-03-28 DIAGNOSIS — N3000 Acute cystitis without hematuria: Secondary | ICD-10-CM

## 2023-03-28 DIAGNOSIS — N184 Chronic kidney disease, stage 4 (severe): Secondary | ICD-10-CM | POA: Diagnosis not present

## 2023-03-28 DIAGNOSIS — I10 Essential (primary) hypertension: Secondary | ICD-10-CM

## 2023-03-28 DIAGNOSIS — D649 Anemia, unspecified: Secondary | ICD-10-CM | POA: Diagnosis not present

## 2023-03-28 LAB — CBC WITH DIFFERENTIAL/PLATELET
Abs Immature Granulocytes: 0.13 10*3/uL — ABNORMAL HIGH (ref 0.00–0.07)
Basophils Absolute: 0 10*3/uL (ref 0.0–0.1)
Basophils Relative: 0 %
Eosinophils Absolute: 0.1 10*3/uL (ref 0.0–0.5)
Eosinophils Relative: 0 %
HCT: 24.4 % — ABNORMAL LOW (ref 39.0–52.0)
Hemoglobin: 7.7 g/dL — ABNORMAL LOW (ref 13.0–17.0)
Immature Granulocytes: 1 %
Lymphocytes Relative: 3 %
Lymphs Abs: 0.5 10*3/uL — ABNORMAL LOW (ref 0.7–4.0)
MCH: 30.4 pg (ref 26.0–34.0)
MCHC: 31.6 g/dL (ref 30.0–36.0)
MCV: 96.4 fL (ref 80.0–100.0)
Monocytes Absolute: 0.9 10*3/uL (ref 0.1–1.0)
Monocytes Relative: 7 %
Neutro Abs: 12.2 10*3/uL — ABNORMAL HIGH (ref 1.7–7.7)
Neutrophils Relative %: 89 %
Platelets: 195 10*3/uL (ref 150–400)
RBC: 2.53 MIL/uL — ABNORMAL LOW (ref 4.22–5.81)
RDW: 14.9 % (ref 11.5–15.5)
WBC: 13.8 10*3/uL — ABNORMAL HIGH (ref 4.0–10.5)
nRBC: 0.4 % — ABNORMAL HIGH (ref 0.0–0.2)

## 2023-03-28 LAB — BASIC METABOLIC PANEL
Anion gap: 12 (ref 5–15)
BUN: 106 mg/dL — ABNORMAL HIGH (ref 8–23)
CO2: 20 mmol/L — ABNORMAL LOW (ref 22–32)
Calcium: 8.4 mg/dL — ABNORMAL LOW (ref 8.9–10.3)
Chloride: 98 mmol/L (ref 98–111)
Creatinine, Ser: 3.4 mg/dL — ABNORMAL HIGH (ref 0.61–1.24)
GFR, Estimated: 19 mL/min — ABNORMAL LOW (ref >60)
Glucose, Bld: 326 mg/dL — ABNORMAL HIGH (ref 70–99)
Potassium: 4.5 mmol/L (ref 3.5–5.1)
Sodium: 130 mmol/L — ABNORMAL LOW (ref 135–145)

## 2023-03-28 LAB — GLUCOSE, CAPILLARY
Glucose-Capillary: 287 mg/dL — ABNORMAL HIGH (ref 70–99)
Glucose-Capillary: 309 mg/dL — ABNORMAL HIGH (ref 70–99)
Glucose-Capillary: 206 mg/dL — ABNORMAL HIGH (ref 70–99)
Glucose-Capillary: 129 mg/dL — ABNORMAL HIGH (ref 70–99)

## 2023-03-28 LAB — URINE CULTURE

## 2023-03-28 LAB — MAGNESIUM: Magnesium: 2.3 mg/dL (ref 1.7–2.4)

## 2023-03-28 LAB — UREA NITROGEN, URINE: Urea Nitrogen, Ur: 566 mg/dL

## 2023-03-28 MED ORDER — SODIUM CHLORIDE 0.9 % IV SOLN: INTRAVENOUS | Status: AC

## 2023-03-28 MED ORDER — AMLODIPINE BESYLATE 5 MG PO TABS: 5.0000 mg | ORAL_TABLET | Freq: Every day | ORAL | Status: DC

## 2023-03-28 MED ORDER — INSULIN DETEMIR 100 UNIT/ML ~~LOC~~ SOLN
5.0000 [IU] | Freq: Two times a day (BID) | SUBCUTANEOUS | Status: DC
  Administered 2023-03-28 (×2): 5 [IU] via SUBCUTANEOUS
  Filled 2023-03-28 (×4): qty 0.05

## 2023-03-28 MED ORDER — CARVEDILOL 12.5 MG PO TABS
12.5000 mg | ORAL_TABLET | Freq: Two times a day (BID) | ORAL | Status: DC
  Administered 2023-03-28: 12.5 mg via ORAL
  Filled 2023-03-28: qty 1

## 2023-03-28 MED ORDER — ENSURE ENLIVE PO LIQD
237.0000 mL | Freq: Two times a day (BID) | ORAL | Status: DC
  Administered 2023-03-28 – 2023-03-29 (×2): 237 mL via ORAL

## 2023-03-28 MED ORDER — ADULT MULTIVITAMIN W/MINERALS CH
1.0000 | ORAL_TABLET | Freq: Every day | ORAL | Status: DC
  Administered 2023-03-28 – 2023-03-29 (×2): 1 via ORAL
  Filled 2023-03-28 (×2): qty 1

## 2023-03-28 MED ORDER — AMLODIPINE BESYLATE 10 MG PO TABS: 10.0000 mg | ORAL_TABLET | Freq: Every day | ORAL | Status: DC

## 2023-03-28 NOTE — Progress Notes (Signed)
Initial Nutrition Assessment  INTERVENTION:   -Ensure Plus High Protein po BID, each supplement provides 350 kcal and 20 grams of protein.   -Multivitamin with minerals daily  NUTRITION DIAGNOSIS:   Inadequate oral intake related to dysphagia as evidenced by meal completion < 50%.  GOAL:   Patient will meet greater than or equal to 90% of their needs  MONITOR:   PO intake, Supplement acceptance, Labs, Weight trends, I & O's  REASON FOR ASSESSMENT:   Malnutrition Screening Tool    ASSESSMENT:   67 y.o. male past medical history of ischemic stroke, diabetes mellitus type 2, chronic diastolic heart failure, C. difficile, aspiration pneumonia sent from [facility] due to abnormal labs of a hemoglobin of 6.7 and show FOBT was positive repeated was negative.  Patient in room, eating lunch. Per RN, not a good historian. Pt mainly mumbles answers to questions. He ate some broccoli from his lunch but very little of his spaghetti on lunch tray. Pt agreeable to chocolate Ensure since not eating much at this time. SLP assessed this morning, recommend continuing regular diet with thin liquids. Pt with chronic dysphagia.   Per weight records, pt has lost 11 lbs since 1/30 (7% wt loss x 4 months, insignificant for time frame).  Medications: Remeron  Labs reviewed: CBGs: 117-309 Low Na   NUTRITION - FOCUSED PHYSICAL EXAM:  Flowsheet Row Most Recent Value  Orbital Region Moderate depletion  Upper Arm Region No depletion  Thoracic and Lumbar Region No depletion  Buccal Region Mild depletion  Temple Region Moderate depletion  Clavicle Bone Region No depletion  Clavicle and Acromion Bone Region No depletion  Scapular Bone Region No depletion  Dorsal Hand No depletion  Patellar Region No depletion  Anterior Thigh Region No depletion  Posterior Calf Region No depletion  Edema (RD Assessment) Moderate  [BLEs]  Hair Reviewed  Eyes Reviewed  Mouth Reviewed  [edentulous]  Skin  Reviewed  Nails Reviewed       Diet Order:   Diet Order             Diet Carb Modified Fluid consistency: Thin; Room service appropriate? Yes  Diet effective now                   EDUCATION NEEDS:   Not appropriate for education at this time  Skin:  Skin Assessment: Skin Integrity Issues: Skin Integrity Issues:: Stage II Stage II: rt buttocks  Last BM:  5/28 -type 6  Height:   Ht Readings from Last 1 Encounters:  03/27/23 5\' 4"  (1.626 m)    Weight:   Wt Readings from Last 1 Encounters:  03/27/23 62.4 kg    BMI:  Body mass index is 23.61 kg/m.  Estimated Nutritional Needs:   Kcal:  1650-1850  Protein:  75-85g  Fluid:  1.8L/day  Tilda Franco, MS, RD, LDN Inpatient Clinical Dietitian Contact information available via Amion

## 2023-03-28 NOTE — Progress Notes (Addendum)
[name]    Progress Note  Manuel Kramer  NFA:[id number] DOB: 1956-03-20 DOA: 03/26/2023 PCP: Diamantina Providence, FNP     Brief Narrative:   Manuel Kramer is an 67 y.o. male past medical history of ischemic stroke, diabetes mellitus type 2, chronic diastolic heart failure, C. difficile, aspiration pneumonia sent from [facility] due to abnormal labs of a hemoglobin of 6.7 and show FOBT was positive repeated was negative.  Assessment/Plan:   Symptomatic anemia: Aspirin and Plavix was held, FOBT initially was positive but repeated was negative. SCDs for DVT prophylaxis. Status post 1 unit of packed red blood cells on 03/26/2023. PT OT has been consulted. Palliative care was consulted for goals of care. Hemoglobin appears to be at baseline 7.7.  Gastroenteritis due to norovirus: Causing GI losses. Continue supportive care with IV fluids.  Will continue IV fluids for an additional 24 hours. Recheck basic metabolic panel in the morning. Currently on contact precaution.  Acute metabolic encephalopathy: Question due to dehydration infectious etiology. He does have history of chronic lethargy/narcolepsy, workup in the past has been unrevealing. Started on modafinil. With a history of CVA, question vascular dementia will need to be evaluated as an outpatient.  Acute cystitis: UA consistent with infectious etiology started on IV Rocephin. Urine cultures, negative till date will complete a 3-day course of antibiotics.  Chronic kidney see stage IIIb-IV: Baseline creatinine 3.2-3.4. Slight increase likely due to diarrhea in the setting of norovirus.  Essential hypertension: Blood pressure is trending up currently on Norvasc, clonidine, hydralazine.  Anemia of chronic disease: See above for further details.  History of CVA: Long-term resident [facility]. Currently on modafinil. Physical therapy evaluated the patient recommended to go back to  skilled  Seizure disorder: Continue Keppra.  Diabetes mellitus type 2 with complications: Continue sliding scale insulin.  Depression with anxiety: Continue Zoloft and mirtazapine.  Dysphagia: SLP more awake, working with Speech.  Chronic diastolic heart failure: Appears euvolemic resume Coreg.  Protein caloric malnutrition: Continue diet SLP was requested initially was not able to be performed as he was lethargic. Will try again today  Sacral Decubitus Ulcer Stage 2: RN Pressure Injury Documentation: Pressure Injury 05/07/22 Coccyx Medial;Left;Right Stage 2 -  Partial thickness loss of dermis presenting as a shallow open injury with a red, pink wound bed without slough. small area of open skin (Active)  05/07/22 2200  Location: Coccyx  Location Orientation: Medial;Left;Right  Staging: Stage 2 -  Partial thickness loss of dermis presenting as a shallow open injury with a red, pink wound bed without slough.  Wound Description (Comments): small area of open skin  Present on Admission: Yes     Pressure Injury 03/26/23 Buttocks Right;Left Stage 2 -  Partial thickness loss of dermis presenting as a shallow open injury with a red, pink wound bed without slough. (Active)  03/26/23 2300  Location: Buttocks  Location Orientation: Right;Left  Staging: Stage 2 -  Partial thickness loss of dermis presenting as a shallow open injury with a red, pink wound bed without slough.  Wound Description (Comments):   Present on Admission:   Dressing Type Foam - Lift dressing to assess site every shift 03/27/23 2036     DVT prophylaxis: lovenox Family Communication:none Status is: Inpatient Remains inpatient appropriate because: acute metabolic encephalopathy    Code Status:     Code Status Orders  (From admission, onward)           Start     Ordered  03/26/23 2059  Do not attempt resuscitation (DNR)  Continuous       Question Answer Comment  If patient has no pulse and is not  breathing Do Not Attempt Resuscitation   If patient has a pulse and/or is breathing: Medical Treatment Goals LIMITED ADDITIONAL INTERVENTIONS: Use medication/IV fluids and cardiac monitoring as indicated; Do not use intubation or mechanical ventilation (DNI), also provide comfort medications.  Transfer to Progressive/Stepdown as indicated, avoid Intensive Care.   Consent: Discussion documented in EHR or advanced directives reviewed      03/26/23 2059           Code Status History     Date Active Date Inactive Code Status Order ID Comments User Context   11/16/2022 2234 11/21/2022 2345 Full Code [id number]  Lurline Del, MD ED   11/08/2022 1535 11/13/2022 1809 Full Code [id number]  Bobette Mo, MD ED   05/07/2022 2121 05/12/2022 1803 Full Code [id number]  Steffanie Dunn, DO ED   01/25/2022 1722 02/02/2022 2320 Full Code [id number]  [place], Eric J, DO ED   11/14/2021 0830 12/06/2021 0004 Full Code [id number]  Kathlene Cote, PA-C ED         IV Access:   Peripheral IV   Procedures and diagnostic studies:   DG Chest Portable 1 View  Result Date: 03/26/2023 CLINICAL DATA:  Weakness. EXAM: PORTABLE CHEST 1 VIEW COMPARISON:  February 22, 2023 FINDINGS: Enlarged cardiac silhouette. Mild interstitial pulmonary edema.  No focal airspace consolidation. Sub pulmonic pleural effusions cannot be excluded. IMPRESSION: 1. Enlarged cardiac silhouette. 2. Mild interstitial pulmonary edema. 3. Sub pulmonic pleural effusions cannot be excluded. Electronically Signed   By: Ted Mcalpine M.D.   On: 03/26/2023 15:57     Medical Consultants:   None.   Subjective:    Manuel Kramer no complaints tolerating his diet  Objective:    Vitals:   03/27/23 1021 03/27/23 1022 03/27/23 2102 03/28/23 0519  BP: 123/65  114/77 (!) 152/75  Pulse: 60  (!) 59 65  Resp: 18  16 16   Temp: 97.8 F (36.6 C)  97.8 F (36.6 C) 98.1 F (36.7 C)  TempSrc: Oral  Oral Oral  SpO2: 95%  97% 97%  Weight:       Height:  5\' 4"  (1.626 m)     SpO2: 97 %   Intake/Output Summary (Last 24 hours) at 03/28/2023 3664 Last data filed at 03/28/2023 0200 Gross per 24 hour  Intake 1954.87 ml  Output 925 ml  Net 1029.87 ml   Filed Weights   03/27/23 0647  Weight: 62.4 kg    Exam: General exam: In no acute distress. Respiratory system: Good air movement and clear to auscultation. Cardiovascular system: S1 & S2 heard, RRR. No JVD. Gastrointestinal system: Abdomen is nondistended, soft and nontender.  Extremities: No pedal edema. Skin: No rashes, lesions or ulcers Psychiatry: No distress no judgment or insight of medical condition  Data Reviewed:    Labs: Basic Metabolic Panel: Recent Labs  Lab 03/26/23 1615 03/27/23 0514 03/28/23 0524  NA 133* 134* 130*  K 4.5 4.4 4.5  CL 100 103 98  CO2 22 21* 20*  GLUCOSE 174* 93 326*  BUN 104* 102* 106*  CREATININE 3.61* 3.70* 3.40*  CALCIUM 8.7* 8.3* 8.4*  MG  --   --  2.3   GFR Estimated Creatinine Clearance: 17.9 mL/min (A) (by C-G formula based on SCr of 3.4 mg/dL (H)). Liver Function Tests: Recent Labs  Lab  03/26/23 1615 03/27/23 0514  AST 70* 40  ALT 98* 79*  ALKPHOS 115 109  BILITOT 0.4 0.9  PROT 6.1* 5.8*  ALBUMIN 2.5* 2.3*   Recent Labs  Lab 03/26/23 1615  LIPASE 22   No results for input(s): "AMMONIA" in the last 168 hours. Coagulation profile No results for input(s): "INR", "PROTIME" in the last 168 hours. COVID-19 Labs  No results for input(s): "DDIMER", "FERRITIN", "LDH", "CRP" in the last 72 hours.  Lab Results  Component Value Date   SARSCOV2NAA NEGATIVE 11/16/2022   SARSCOV2NAA NEGATIVE 11/08/2022   SARSCOV2NAA NEGATIVE 05/07/2022   SARSCOV2NAA NEGATIVE 12/05/2021    CBC: Recent Labs  Lab 03/26/23 1615 03/27/23 0514 03/28/23 0524  WBC 16.3* 15.9* 13.8*  NEUTROABS 14.9*  --  12.2*  HGB 6.7* 7.3* 7.7*  HCT 21.1* 22.5* 24.4*  MCV 99.1 95.7 96.4  PLT 193 187 195   Cardiac Enzymes: No results for  input(s): "CKTOTAL", "CKMB", "CKMBINDEX", "TROPONINI" in the last 168 hours. BNP (last 3 results) No results for input(s): "PROBNP" in the last 8760 hours. CBG: Recent Labs  Lab 03/27/23 0811 03/27/23 1245 03/27/23 1634 03/27/23 2059 03/28/23 0735  GLUCAP 117* 167* 251* 236* 287*   D-Dimer: No results for input(s): "DDIMER" in the last 72 hours. Hgb A1c: No results for input(s): "HGBA1C" in the last 72 hours. Lipid Profile: No results for input(s): "CHOL", "HDL", "LDLCALC", "TRIG", "CHOLHDL", "LDLDIRECT" in the last 72 hours. Thyroid function studies: No results for input(s): "TSH", "T4TOTAL", "T3FREE", "THYROIDAB" in the last 72 hours.  Invalid input(s): "FREET3" Anemia work up: No results for input(s): "VITAMINB12", "FOLATE", "FERRITIN", "TIBC", "IRON", "RETICCTPCT" in the last 72 hours. Sepsis Labs: Recent Labs  Lab 03/26/23 1615 03/27/23 0514 03/28/23 0524  WBC 16.3* 15.9* 13.8*   Microbiology Recent Results (from the past 240 hour(s))  Gastrointestinal Panel by PCR , Stool     Status: Abnormal   Collection Time: 03/26/23  7:57 PM   Specimen: Stool  Result Value Ref Range Status   Campylobacter species NOT DETECTED NOT DETECTED Final   Plesimonas shigelloides NOT DETECTED NOT DETECTED Final   Salmonella species NOT DETECTED NOT DETECTED Final   Yersinia enterocolitica NOT DETECTED NOT DETECTED Final   Vibrio species NOT DETECTED NOT DETECTED Final   Vibrio cholerae NOT DETECTED NOT DETECTED Final   Enteroaggregative E coli (EAEC) NOT DETECTED NOT DETECTED Final   Enteropathogenic E coli (EPEC) NOT DETECTED NOT DETECTED Final   Enterotoxigenic E coli (ETEC) NOT DETECTED NOT DETECTED Final   Shiga like toxin producing E coli (STEC) NOT DETECTED NOT DETECTED Final   Shigella/Enteroinvasive E coli (EIEC) NOT DETECTED NOT DETECTED Final   Cryptosporidium NOT DETECTED NOT DETECTED Final   Cyclospora cayetanensis NOT DETECTED NOT DETECTED Final   Entamoeba histolytica  NOT DETECTED NOT DETECTED Final   Giardia lamblia NOT DETECTED NOT DETECTED Final   Adenovirus F40/41 NOT DETECTED NOT DETECTED Final   Astrovirus NOT DETECTED NOT DETECTED Final   Norovirus GI/GII DETECTED (A) NOT DETECTED Final    Comment: RESULT CALLED TO, READ BACK BY AND VERIFIED WITH: Mellody Drown PATEL 03/27/23 0932 MW    Rotavirus A NOT DETECTED NOT DETECTED Final   Sapovirus (I, II, IV, and V) NOT DETECTED NOT DETECTED Final    Comment: Performed at [facility], [address] [place], [place] [address]     Medications:    amLODipine  2.5 mg Oral Daily   atorvastatin  40 mg Oral Daily  cloNIDine  0.1 mg Oral TID   hydrALAZINE  25 mg Oral TID   insulin aspart  0-15 Units Subcutaneous TID WC   levETIRAcetam  500 mg Oral BID   mirtazapine  7.5 mg Oral QHS   modafinil  100 mg Oral Daily   mouth rinse  15 mL Mouth Rinse 4 times per day   sertraline  50 mg Oral Daily   Continuous Infusions:  cefTRIAXone (ROCEPHIN)  IV 2 g (03/27/23 1645)   lactated ringers 100 mL/hr at 03/28/23 0618      LOS: 2 days   Marinda Elk  [facility]  03/28/2023, 9:38 AM

## 2023-03-28 NOTE — Evaluation (Signed)
Clinical/Bedside Swallow Evaluation Patient Details  Name: Manuel Kramer MRN: [id number] Date of Birth: [birth year]  Today's Date: 03/28/2023 Time: SLP Start Time (ACUTE ONLY): 0940 SLP Stop Time (ACUTE ONLY): 1009 SLP Time Calculation (min) (ACUTE ONLY): 29 min  Past Medical History:  Past Medical History:  Diagnosis Date   Acute ischemic stroke (HCC) > small acute to subacute pontine infarct seen on MRI 05/10/2022   Acute metabolic encephalopathy 05/08/2022   Acute on chronic diastolic congestive heart failure (HCC) 11/08/2022   Acute renal failure superimposed on stage 3b chronic kidney disease (HCC) 01/25/2022   AKI (acute kidney injury) (HCC)    Aspiration pneumonia (HCC) 05/08/2022   C. difficile colitis 01/26/2022   Chronic diarrhea    Diabetes mellitus without complication (HCC)    Hypertension    Hypertensive emergency 10/02/2014   Hypertensive emergency without congestive heart failure    Renal insufficiency 10/02/2014   Sepsis (HCC)    Sepsis due to undetermined organism (HCC) 11/08/2022   UTI (urinary tract infection) 11/16/2022   Past Surgical History:  Past Surgical History:  Procedure Laterality Date   COLOSCOPY      POLYP   HPI:  67 y/o male presenting on 5/27 with abnormal labs from SNF.  Admitted for failure to thrive. Possible underlying GI bleed, likely UTI. PMH includes: CKD, DM, HTN, dementia, anemia, CHF, aspiration PNA, cdiff, CVA, [name]- difficulty chewing banana, Single contrast esophagram significant for diffusely patulous esophagus with moderate dysmotility with tertiary contractions. No stricture, mass or hiatal hernia seen on limited exam.  Patient known to his prior SLP when seen for bedside evaluation with recommendations for soft diet and thin liquids using compensation strategies.    Assessment / Plan / Recommendation  Clinical Impression  Patient known to the SLP from prior clinical swallow evaluation.  He has chronic dysphagia  suspected oral involvement with right facial and trigeminal nerve impairment and known esophageal dysmotility.  He is edentulous but states that he has dentures at the facility.  Patient able to feed himself today including orange slice cut up to small pieces, applesauce x 3.5 ounces and thin liquids.  Clinically judged prolonged oral transiting and right sulci pocketing solid applesauce without consistent awareness.  Despite SLP cues to finger sweep or use them to clear patient advised he did not want to do so.  He was able to clear all cavity of solids with consumption of applesauce and liquid. Subtle throat clearing and cough noted during intake but patient able to clear and there were no negative changes in vitals.  Chest x-ray was negative upon admission to hospital thus patient appears to be managing his chronic dysphagia.  Recommend continue diet with general precautions.  Demonstrated to patient use of oral suction and advised to use pure and liquids to transit solid food in right sulci. Reviewed findings with RN and posted swallow precautions signs and.  No SLP follow-up at this time as all education completed and compensation strategies in place.  Thanks for the consult. SLP Visit Diagnosis: Dysphagia, oral phase (R13.11) (Known esophageal issues)    Aspiration Risk  Mild aspiration risk    Diet Recommendation Thin liquid (Diet as tolerated)   Liquid Administration via: Straw;Cup Medication Administration: Whole meds with puree Supervision: Patient able to self feed;Comment (Intermittent supervision to assure patient clearing now, if needed have patient orally suction) Compensations: Slow rate;Small sips/bites;Other (Comment) (Use pure to aid oral transit of solids, use oral suction if patient does not clear especially  right sulcus) Postural Changes: Seated upright at 90 degrees;Remain upright for at least 30 minutes after po intake    Other  Recommendations Oral Care Recommendations: Oral  care QID    Recommendations for follow up therapy are one component of a multi-disciplinary discharge planning process, led by the attending physician.  Recommendations may be updated based on patient status, additional functional criteria and insurance authorization.  Follow up Recommendations No SLP follow up      Assistance Recommended at Discharge    Functional Status Assessment Patient has not had a recent decline in their functional status  Frequency and Duration   N/a         Prognosis     TBD   Swallow Study   General Date of Onset: 03/28/23 HPI: 67 y/o male presenting on 5/27 with abnormal labs from SNF.  Admitted for failure to thrive. Possible underlying GI bleed, likely UTI. PMH includes: CKD, DM, HTN, dementia, anemia, CHF, aspiration PNA, cdiff, CVA, [name]- difficulty chewing banana, Single contrast esophagram significant for diffusely patulous esophagus with moderate dysmotility with tertiary contractions. No stricture, mass or hiatal hernia seen on limited exam.  Patient known to his prior SLP when seen for bedside evaluation with recommendations for soft diet and thin liquids using compensation strategies. Type of Study: Bedside Swallow Evaluation Previous Swallow Assessment: See HPI Diet Prior to this Study: Regular;Thin liquids (Level 0) Temperature Spikes Noted: No Respiratory Status: Room air History of Recent Intubation: No Behavior/Cognition: Alert;Cooperative;Other (Comment) (Inconsistently follows directions, difficulty transitioning from tasks) Oral Care Completed by SLP: Yes Oral Cavity - Dentition: Edentulous;Other (Comment) (Patient reports he has dentures at the facility however during prior admits he was edentulous) Vision: Functional for self-feeding Self-Feeding Abilities: Able to feed self Patient Positioning: Upright in bed Baseline Vocal Quality: Normal Volitional Cough: Strong Volitional Swallow: Unable to elicit     Oral/Motor/Sensory Function Overall Oral Motor/Sensory Function: Other (comment) (Noted right facial a symmetry as well as impaired movement of right compared to left; question baseline.  Prior evaluations completed by SLP did not show patient had right facial asymmetry.  Suspect facial and trigeminal nerve impairment from prior CVAs)   Ice Chips Ice chips: Not tested   Thin Liquid Thin Liquid: Within functional limits Presentation: Straw Other Comments: Subtle throat clearing    Nectar Thick Nectar Thick Liquid: Not tested   Honey Thick Honey Thick Liquid: Not tested   Puree Puree: Impaired Presentation: Spoon;Self Fed Oral Phase Impairments: Poor awareness of bolus Oral Phase Functional Implications: Other (comment);Prolonged oral transit;Right lateral sulci pocketing;Oral residue Other Comments: clinically patient appears with prolonged oral transiting and consistently with retention in the right sulcus; eventually able to clear independently   Solid     Solid: Impaired Oral Phase Impairments: Reduced lingual movement/coordination Oral Phase Functional Implications: Impaired mastication;Prolonged oral transit;Right lateral sulci pocketing;Oral residue Pharyngeal Phase Impairments: Cough - Delayed Other Comments: clinically patient appears with prolonged oral transiting and consistently with retention in the right sulcus; eventually able to clear with use of applesauce      Chales Abrahams 03/28/2023,10:57 AM  Rolena Infante, MS [facility] SLP Acute Rehab Services Office [phone]

## 2023-03-28 NOTE — Progress Notes (Addendum)
NP Garner Nash was notified that patient hasn't voided since being I&O cath at 1745. Bladder scanner showed of urine and patient states he doesn't need to void.

## 2023-03-29 DIAGNOSIS — Z794 Long term (current) use of insulin: Secondary | ICD-10-CM

## 2023-03-29 DIAGNOSIS — N3000 Acute cystitis without hematuria: Secondary | ICD-10-CM | POA: Diagnosis not present

## 2023-03-29 DIAGNOSIS — N184 Chronic kidney disease, stage 4 (severe): Secondary | ICD-10-CM | POA: Diagnosis not present

## 2023-03-29 DIAGNOSIS — Z8673 Personal history of transient ischemic attack (TIA), and cerebral infarction without residual deficits: Secondary | ICD-10-CM

## 2023-03-29 DIAGNOSIS — R7401 Elevation of levels of liver transaminase levels: Secondary | ICD-10-CM | POA: Insufficient documentation

## 2023-03-29 DIAGNOSIS — E118 Type 2 diabetes mellitus with unspecified complications: Secondary | ICD-10-CM

## 2023-03-29 DIAGNOSIS — G40909 Epilepsy, unspecified, not intractable, without status epilepticus: Secondary | ICD-10-CM

## 2023-03-29 DIAGNOSIS — D649 Anemia, unspecified: Secondary | ICD-10-CM | POA: Diagnosis not present

## 2023-03-29 LAB — CBC WITH DIFFERENTIAL/PLATELET
Abs Immature Granulocytes: 0.4 10*3/uL — ABNORMAL HIGH (ref 0.00–0.07)
Basophils Absolute: 0 10*3/uL (ref 0.0–0.1)
Basophils Relative: 0 %
Eosinophils Absolute: 0.2 10*3/uL (ref 0.0–0.5)
Eosinophils Relative: 2 %
HCT: 26.7 % — ABNORMAL LOW (ref 39.0–52.0)
Hemoglobin: 8.5 g/dL — ABNORMAL LOW (ref 13.0–17.0)
Immature Granulocytes: 4 %
Lymphocytes Relative: 8 %
Lymphs Abs: 0.8 10*3/uL (ref 0.7–4.0)
MCH: 30.7 pg (ref 26.0–34.0)
MCHC: 31.8 g/dL (ref 30.0–36.0)
MCV: 96.4 fL (ref 80.0–100.0)
Monocytes Absolute: 0.8 10*3/uL (ref 0.1–1.0)
Monocytes Relative: 8 %
Neutro Abs: 7.7 10*3/uL (ref 1.7–7.7)
Neutrophils Relative %: 78 %
Platelets: 223 10*3/uL (ref 150–400)
RBC: 2.77 MIL/uL — ABNORMAL LOW (ref 4.22–5.81)
RDW: 14.6 % (ref 11.5–15.5)
WBC: 10 10*3/uL (ref 4.0–10.5)
nRBC: 0.9 % — ABNORMAL HIGH (ref 0.0–0.2)

## 2023-03-29 LAB — HEMOGLOBIN A1C
Hgb A1c MFr Bld: 7.9 % — ABNORMAL HIGH (ref 4.8–5.6)
Mean Plasma Glucose: 180 mg/dL

## 2023-03-29 LAB — BASIC METABOLIC PANEL
Anion gap: 11 (ref 5–15)
BUN: 98 mg/dL — ABNORMAL HIGH (ref 8–23)
CO2: 20 mmol/L — ABNORMAL LOW (ref 22–32)
Calcium: 8.3 mg/dL — ABNORMAL LOW (ref 8.9–10.3)
Chloride: 99 mmol/L (ref 98–111)
Creatinine, Ser: 3.18 mg/dL — ABNORMAL HIGH (ref 0.61–1.24)
GFR, Estimated: 21 mL/min — ABNORMAL LOW (ref >60)
Glucose, Bld: 42 mg/dL — CL (ref 70–99)
Potassium: 4.2 mmol/L (ref 3.5–5.1)
Sodium: 130 mmol/L — ABNORMAL LOW (ref 135–145)

## 2023-03-29 LAB — MAGNESIUM: Magnesium: 2.4 mg/dL (ref 1.7–2.4)

## 2023-03-29 LAB — GLUCOSE, CAPILLARY
Glucose-Capillary: 108 mg/dL — ABNORMAL HIGH (ref 70–99)
Glucose-Capillary: 33 mg/dL — CL (ref 70–99)
Glucose-Capillary: 73 mg/dL (ref 70–99)
Glucose-Capillary: 130 mg/dL — ABNORMAL HIGH (ref 70–99)

## 2023-03-29 MED ORDER — HYDRALAZINE HCL 100 MG PO TABS: 50.0000 mg | ORAL_TABLET | Freq: Three times a day (TID) | ORAL | 0 refills | Status: AC

## 2023-03-29 MED ORDER — AMLODIPINE BESYLATE 10 MG PO TABS
10.0000 mg | ORAL_TABLET | Freq: Every day | ORAL | Status: DC
  Administered 2023-03-29: 10 mg via ORAL
  Filled 2023-03-29: qty 1

## 2023-03-29 MED ORDER — TAMSULOSIN HCL 0.4 MG PO CAPS: 0.8000 mg | ORAL_CAPSULE | Freq: Every day | ORAL | Status: DC

## 2023-03-29 MED ORDER — INSULIN ASPART 100 UNIT/ML IJ SOLN: 6.0000 [IU] | Freq: Three times a day (TID) | INTRAMUSCULAR | 11 refills | Status: AC

## 2023-03-29 MED ORDER — AMOXICILLIN-POT CLAVULANATE 500-125 MG PO TABS: 1.0000 | ORAL_TABLET | Freq: Two times a day (BID) | ORAL | Status: DC

## 2023-03-29 MED ORDER — AMOXICILLIN-POT CLAVULANATE 875-125 MG PO TABS: 1.0000 | ORAL_TABLET | Freq: Two times a day (BID) | ORAL | 0 refills | Status: AC

## 2023-03-29 MED ORDER — INSULIN ASPART 100 UNIT/ML IJ SOLN: 6.0000 [IU] | Freq: Three times a day (TID) | INTRAMUSCULAR | 11 refills | Status: DC

## 2023-03-29 MED ORDER — TAMSULOSIN HCL 0.4 MG PO CAPS: 0.8000 mg | ORAL_CAPSULE | Freq: Every day | ORAL | 0 refills | Status: AC

## 2023-03-29 MED ORDER — HYDRALAZINE HCL 50 MG PO TABS
100.0000 mg | ORAL_TABLET | Freq: Three times a day (TID) | ORAL | Status: DC
  Filled 2023-03-29: qty 2

## 2023-03-29 MED ORDER — DEXTROSE 50 % IV SOLN
25.0000 g | INTRAVENOUS | Status: AC
  Administered 2023-03-29: 25 g via INTRAVENOUS

## 2023-03-29 MED ORDER — CARVEDILOL 25 MG PO TABS
25.0000 mg | ORAL_TABLET | Freq: Two times a day (BID) | ORAL | Status: DC
  Administered 2023-03-29: 25 mg via ORAL
  Filled 2023-03-29: qty 1

## 2023-03-29 MED ORDER — DEXTROSE 50 % IV SOLN
INTRAVENOUS | Status: AC
  Filled 2023-03-29: qty 50

## 2023-03-29 NOTE — TOC Progression Note (Addendum)
Transition of Care [facility]) - Progression Note    Patient Details  Name: Manuel Kramer MRN: [id number] Date of Birth: 1956/07/06  Transition [facility]) CM/SW Contact  Airanna Partin, Olegario Messier, RN Phone Number: 03/29/2023, 10:24 AM  Clinical Narrative:d/c today to [name] rep [name] accepted. Going to rm#[phone], [phone].PTAR called. No further CM needs. -12:15p-PTAR was unable to pick up during 1st PTAR call d/t BP issues. Received message from attending to call PTAR. No further CM needs.      Expected Discharge Plan: Long Term Nursing Home Barriers to Discharge: No Barriers Identified  Expected Discharge Plan and Services   Discharge Planning Services: CM Consult Post Acute Care Choice: Nursing Home Living arrangements for the past 2 months:  (LTC) Expected Discharge Date: 03/29/23                                     Social Determinants of Health (SDOH) Interventions SDOH Screenings   Food Insecurity: Patient Unable To Answer (03/27/2023)  Housing: Patient Declined (03/28/2023)  Recent Concern: Housing - High Risk (03/27/2023)  Transportation Needs: Patient Unable To Answer (03/27/2023)  Utilities: Patient Unable To Answer (03/27/2023)  Tobacco Use: High Risk (03/26/2023)    Readmission Risk Interventions    03/27/2023   12:33 PM 11/21/2022   10:50 AM  Readmission Risk Prevention Plan  Transportation Screening Complete Complete  PCP or Specialist Appt within 3-5 Days  Complete  HRI or Home Care Consult Complete Not Complete  Palliative Care Screening Not Applicable Not Applicable  Medication Review (RN Care Manager)  Complete

## 2023-03-29 NOTE — Care Management Important Message (Signed)
Important Message  Patient Details IM Letter given Name: Manuel Kramer MRN: [id number] Date of Birth: Dec 27, 1955   Medicare Important Message Given:  Yes     [name] 03/29/2023, 11:34 AM

## 2023-03-29 NOTE — Discharge Summary (Addendum)
Physician Discharge Summary  Manuel Kramer ZOX:[id number] DOB: 29-May-1956 DOA: 03/26/2023  PCP: Diamantina Providence, FNP  Admit date: 03/26/2023 Discharge date: 03/29/2023  Admitted From: Long-term care facility  Disposition: Long-term care facility   Recommendations for Outpatient Follow-up:  Follow up with urology in 2-4 weeks, to try voiding trial. Please obtain BMP/CBC in one week   Home Health:No Equipment/Devices:None  Discharge Condition:Stable CODE STATUS:DNR Diet recommendation: Heart Healthy   Brief/Interim Summary: 67 y.o. male past medical history of ischemic stroke, diabetes mellitus type 2, chronic diastolic heart failure, C. difficile, aspiration pneumonia sent from [facility] due to abnormal labs of a hemoglobin of 6.7 and show FOBT was positive repeated was negative.    Discharge Diagnoses:  Principal Problem:   Symptomatic anemia Active Problems:   Gastroenteritis due to norovirus   Essential hypertension   CKD (chronic kidney disease) stage 4, GFR 15-29 ml/min (HCC)   Acute cystitis   Anemia of chronic disease   Seizure disorder (HCC)   History of CVA (cerebrovascular accident)   Type 2 diabetes mellitus with complication, with long-term current use of insulin (HCC)   Depression with anxiety   Hyperlipidemia   Protein-calorie malnutrition, severe   Chronic diastolic CHF (congestive heart failure) (HCC)   Pressure injury of skin   Dysphagia  Symptomatic anemia: Aspirin and Plavix were held FOBT was positive but repeat was negative. He status post 1 unit packed red blood cells PT OT was consulted recommended to go back to long-term rehab. His hemoglobin on the day of discharge is 8.5 no further signs of bleeding.  Gastroenteritis due to Norovirus: Causing GI losses he was treated conservatively with supportive care. He was able to tolerate his diet. His creatinine returned to baseline.  Acute metabolic encephalopathy: Likely due to dehydration  in the setting of infectious etiology. He was started back on modafinil.  Acute cystitis: UA consistent with infection he was treated with 3 days of IV Rocephin urine cultures show multiple species. Will continue Augmentin for total of 7 days as an outpatient.  Acute urinary retention: Start empirically on IV Rocephin urine cultures were inconclusive, he had to be in and out catheter 3 times. Foley was inserted and he was started on oral Flomax. He will follow-up with urology as an outpatient.  Chronic kidney disease stage IIIb-IV: With a baseline creatinine of 3.2-3.4.  Essential hypertension: Initially his blood pressure medications were held as his blood pressure trended up after IV hydration they were resumed he will continue them as an outpatient no changes made.  Anemia of chronic disease: See above for further details.  History of CVA: Noted back to long-term care.  Seizure disorder: Continue Keppra. On inhalers.  Diabetes mellitus type 2 without complication: No change made to his medication.  Depression with anxiety: Continue Zoloft and mirtazapine.  Dysphagia: Speech evaluated the patient recommended a dysphagia diet.  Chronic diastolic heart failure: Continue current regimen no changes made.  Sacral decubitus ulcer stage II present on admission:    Discharge Instructions  Discharge Instructions     Diet - low sodium heart healthy   Complete by: As directed    Discharge wound care:   Complete by: As directed    Per wound care nurse   Increase activity slowly   Complete by: As directed       Allergies as of 03/29/2023       Reactions   Codeine Other (See Comments)   On MAR, unknown reaction   Iodinated  Contrast Media Other (See Comments)   On MAR, unknown reaction   Pentazocine Other (See Comments)   On MAR, unknown reaction **Talwin**   Statins Other (See Comments)   On MAR, unknown reaction        Medication List     TAKE these  medications    acetaminophen 500 MG tablet Commonly known as: TYLENOL Take 1 tablet (500 mg total) by mouth every 6 (six) hours as needed. What changed: reasons to take this   acidophilus Caps capsule Take 2 capsules by mouth daily. What changed:  how much to take when to take this   amLODipine 10 MG tablet Commonly known as: NORVASC Take 1 tablet (10 mg total) by mouth daily.   amoxicillin-clavulanate 875-125 MG tablet Commonly known as: AUGMENTIN Take 1 tablet by mouth every 12 (twelve) hours for 4 days.   ascorbic acid 500 MG tablet Commonly known as: VITAMIN C Take 500 mg by mouth daily.   aspirin EC 81 MG tablet Take 81 mg by mouth daily. Swallow whole.   atorvastatin 40 MG tablet Commonly known as: LIPITOR Take 1 tablet (40 mg total) by mouth daily.   carvedilol 25 MG tablet Commonly known as: COREG Take 1 tablet (25 mg total) by mouth 2 (two) times daily with a meal.   cholecalciferol 25 MCG (1000 UNIT) tablet Commonly known as: VITAMIN D3 Take 2,000 Units by mouth daily.   Cinnamon 500 MG capsule Take 500 mg by mouth daily.   cloNIDine 0.3 MG tablet Commonly known as: CATAPRES Take 1 tablet (0.3 mg total) by mouth 3 (three) times daily.   clopidogrel 75 MG tablet Commonly known as: PLAVIX Take 75 mg by mouth daily.   Dermacloud Oint Apply 1 application  topically in the morning and at bedtime. Apply topically to buttocks   doxazosin 2 MG tablet Commonly known as: CARDURA Take 1 tablet (2 mg total) by mouth daily.   feeding supplement (NEPRO CARB STEADY) Liqd Take 237 mLs by mouth 2 (two) times daily between meals.   ferrous sulfate 325 (65 FE) MG tablet Take 1 tablet (325 mg total) by mouth daily with breakfast.   Glucagon Emergency 1 MG Kit Inject 1 mg into the vein once as needed (low blood sugar).   hydrALAZINE 100 MG tablet Commonly known as: APRESOLINE Take 0.5 tablets (50 mg total) by mouth 3 (three) times daily. What changed: how  much to take   insulin aspart 100 UNIT/ML injection Commonly known as: novoLOG Inject 6 Units into the skin 3 (three) times daily with meals. What changed:  how much to take when to take this additional instructions Another medication with the same name was removed. Continue taking this medication, and follow the directions you see here.   insulin glargine 100 UNIT/ML injection Commonly known as: LANTUS Inject 12 Units into the skin in the morning.   ipratropium 0.06 % nasal spray Commonly known as: ATROVENT Place 2 sprays into both nostrils 2 (two) times daily.   isosorbide dinitrate 40 MG tablet Commonly known as: ISORDIL Take 40 mg by mouth 3 (three) times daily.   levETIRAcetam 500 MG tablet Commonly known as: KEPPRA Take 1 tablet (500 mg total) by mouth 2 (two) times daily.   loratadine 10 MG tablet Commonly known as: CLARITIN Take 10 mg by mouth every other day.   magnesium oxide 400 MG tablet Commonly known as: MAG-OX Take 1 tablet (400 mg total) by mouth daily.   mirtazapine 7.5 MG tablet  Commonly known as: REMERON Take 7.5 mg by mouth at bedtime.   modafinil 100 MG tablet Commonly known as: PROVIGIL Take 1 tablet (100 mg total) by mouth daily.   multivitamin with minerals Tabs tablet Take 1 tablet by mouth daily.   niacin 100 MG tablet Commonly known as: VITAMIN B3 Take 100 mg by mouth 2 (two) times daily.   sertraline 50 MG tablet Commonly known as: ZOLOFT Take 50 mg by mouth daily.   sodium bicarbonate 650 MG tablet Take 1,300 mg by mouth 2 (two) times daily.   spironolactone 25 MG tablet Commonly known as: ALDACTONE Take 1 tablet (25 mg total) by mouth daily.   tamsulosin 0.4 MG Caps capsule Commonly known as: FLOMAX Take 2 capsules (0.8 mg total) by mouth daily after breakfast. Start taking on: Mar 30, 2023   torsemide 20 MG tablet Commonly known as: DEMADEX Take 40 mg by mouth daily. What changed: Another medication with the same name  was removed. Continue taking this medication, and follow the directions you see here.               Discharge Care Instructions  (From admission, onward)           Start     Ordered   03/29/23 0000  Discharge wound care:       Comments: Per wound care nurse   03/29/23 0803            Contact information for after-discharge care     Destination     [facility] Preferred SNF .   Service: Skilled Nursing Contact information: [address] [place] [address] [phone]                    Allergies  Allergen Reactions   Codeine Other (See Comments)    On MAR, unknown reaction   Iodinated Contrast Media Other (See Comments)    On MAR, unknown reaction   Pentazocine Other (See Comments)    On MAR, unknown reaction **Talwin**   Statins Other (See Comments)    On MAR, unknown reaction    Consultations: None   Procedures/Studies: DG Chest Portable 1 View  Result Date: 03/26/2023 CLINICAL DATA:  Weakness. EXAM: PORTABLE CHEST 1 VIEW COMPARISON:  February 22, 2023 FINDINGS: Enlarged cardiac silhouette. Mild interstitial pulmonary edema.  No focal airspace consolidation. Sub pulmonic pleural effusions cannot be excluded. IMPRESSION: 1. Enlarged cardiac silhouette. 2. Mild interstitial pulmonary edema. 3. Sub pulmonic pleural effusions cannot be excluded. Electronically Signed   By: Ted Mcalpine M.D.   On: 03/26/2023 15:57     Subjective: No complaints  Discharge Exam: Vitals:   03/28/23 2127 03/29/23 0358  BP: (!) 162/86 (!) 163/78  Pulse: 64 62  Resp: 17 16  Temp: 97.8 F (36.6 C) 97.6 F (36.4 C)  SpO2: 93% 93%   Vitals:   03/28/23 0519 03/28/23 1509 03/28/23 2127 03/29/23 0358  BP: (!) 152/75 (!) 151/87 (!) 162/86 (!) 163/78  Pulse: 65 64 64 62  Resp: 16 18 17 16   Temp: 98.1 F (36.7 C) 98.1 F (36.7 C) 97.8 F (36.6 C) 97.6 F (36.4 C)  TempSrc: Oral Oral Oral Oral  SpO2: 97%  95% 93% 93%  Weight:      Height:        General: Pt is alert, awake, not in acute distress Cardiovascular: RRR, S1/S2 +, no rubs, no gallops Respiratory: CTA bilaterally, no wheezing, no rhonchi Abdominal: Soft, NT,  ND, bowel sounds + Extremities: no edema, no cyanosis    The results of significant diagnostics from this hospitalization (including imaging, microbiology, ancillary and laboratory) are listed below for reference.     Microbiology: Recent Results (from the past 240 hour(s))  Urine Culture     Status: Abnormal   Collection Time: 03/26/23  5:44 PM   Specimen: Urine, Clean Catch  Result Value Ref Range Status   Specimen Description   Final    URINE, CLEAN CATCH Performed at [facility], [address], [place], [place] [address]    Special Requests   Final    NONE Performed at [facility], [address], [place], [place] [address]    Culture MULTIPLE SPECIES PRESENT, SUGGEST RECOLLECTION (A)  Final   Report Status 03/28/2023 FINAL  Final  Gastrointestinal Panel by PCR , Stool     Status: Abnormal   Collection Time: 03/26/23  7:57 PM   Specimen: Stool  Result Value Ref Range Status   Campylobacter species NOT DETECTED NOT DETECTED Final   Plesimonas shigelloides NOT DETECTED NOT DETECTED Final   Salmonella species NOT DETECTED NOT DETECTED Final   Yersinia enterocolitica NOT DETECTED NOT DETECTED Final   Vibrio species NOT DETECTED NOT DETECTED Final   Vibrio cholerae NOT DETECTED NOT DETECTED Final   Enteroaggregative E coli (EAEC) NOT DETECTED NOT DETECTED Final   Enteropathogenic E coli (EPEC) NOT DETECTED NOT DETECTED Final   Enterotoxigenic E coli (ETEC) NOT DETECTED NOT DETECTED Final   Shiga like toxin producing E coli (STEC) NOT DETECTED NOT DETECTED Final   Shigella/Enteroinvasive E coli (EIEC) NOT DETECTED NOT DETECTED Final   Cryptosporidium NOT DETECTED NOT DETECTED Final   Cyclospora cayetanensis NOT  DETECTED NOT DETECTED Final   Entamoeba histolytica NOT DETECTED NOT DETECTED Final   Giardia lamblia NOT DETECTED NOT DETECTED Final   Adenovirus F40/41 NOT DETECTED NOT DETECTED Final   Astrovirus NOT DETECTED NOT DETECTED Final   Norovirus GI/GII DETECTED (A) NOT DETECTED Final    Comment: RESULT CALLED TO, READ BACK BY AND VERIFIED WITH: KRISHNA PATEL 03/27/23 0932 MW    Rotavirus A NOT DETECTED NOT DETECTED Final   Sapovirus (I, II, IV, and V) NOT DETECTED NOT DETECTED Final    Comment: Performed at [facility], [address] [place], [place] [address]     Labs: BNP (last 3 results) Recent Labs    11/08/22 1030 11/16/22 1800 03/27/23 0515  BNP 2,183.5* 947.2* 1,218.1*   Basic Metabolic Panel: Recent Labs  Lab 03/26/23 1615 03/27/23 0514 03/28/23 0524 03/29/23 0524  NA 133* 134* 130* 130*  K 4.5 4.4 4.5 4.2  CL 100 103 98 99  CO2 22 21* 20* 20*  GLUCOSE 174* 93 326* 42*  BUN 104* 102* 106* 98*  CREATININE 3.61* 3.70* 3.40* 3.18*  CALCIUM 8.7* 8.3* 8.4* 8.3*  MG  --   --  2.3 2.4   Liver Function Tests: Recent Labs  Lab 03/26/23 1615 03/27/23 0514  AST 70* 40  ALT 98* 79*  ALKPHOS 115 109  BILITOT 0.4 0.9  PROT 6.1* 5.8*  ALBUMIN 2.5* 2.3*   Recent Labs  Lab 03/26/23 1615  LIPASE 22   No results for input(s): "AMMONIA" in the last 168 hours. CBC: Recent Labs  Lab 03/26/23 1615 03/27/23 0514 03/28/23 0524 03/29/23 0524  WBC 16.3* 15.9* 13.8* 10.0  NEUTROABS 14.9*  --  12.2* 7.7  HGB 6.7* 7.3* 7.7* 8.5*  HCT  21.1* 22.5* 24.4* 26.7*  MCV 99.1 95.7 96.4 96.4  PLT 193 187 195 223   Cardiac Enzymes: No results for input(s): "CKTOTAL", "CKMB", "CKMBINDEX", "TROPONINI" in the last 168 hours. BNP: Invalid input(s): "POCBNP" CBG: Recent Labs  Lab 03/28/23 1158 03/28/23 1647 03/28/23 2124 03/29/23 0600 03/29/23 0621  GLUCAP 309* 206* 129* 33* 108*   D-Dimer No results for input(s): "DDIMER" in the last 72 hours. Hgb  A1c Recent Labs    03/28/23 0524  HGBA1C 7.9*   Lipid Profile No results for input(s): "CHOL", "HDL", "LDLCALC", "TRIG", "CHOLHDL", "LDLDIRECT" in the last 72 hours. Thyroid function studies No results for input(s): "TSH", "T4TOTAL", "T3FREE", "THYROIDAB" in the last 72 hours.  Invalid input(s): "FREET3" Anemia work up No results for input(s): "VITAMINB12", "FOLATE", "FERRITIN", "TIBC", "IRON", "RETICCTPCT" in the last 72 hours. Urinalysis    Component Value Date/Time   COLORURINE YELLOW 03/26/2023 1631   APPEARANCEUR HAZY (A) 03/26/2023 1631   LABSPEC 1.013 03/26/2023 1631   PHURINE 5.0 03/26/2023 1631   GLUCOSEU NEGATIVE 03/26/2023 1631   HGBUR NEGATIVE 03/26/2023 1631   BILIRUBINUR NEGATIVE 03/26/2023 1631   KETONESUR NEGATIVE 03/26/2023 1631   PROTEINUR 100 (A) 03/26/2023 1631   UROBILINOGEN 0.2 01/01/2015 1118   NITRITE NEGATIVE 03/26/2023 1631   LEUKOCYTESUR LARGE (A) 03/26/2023 1631   Sepsis Labs Recent Labs  Lab 03/26/23 1615 03/27/23 0514 03/28/23 0524 03/29/23 0524  WBC 16.3* 15.9* 13.8* 10.0   Microbiology Recent Results (from the past 240 hour(s))  Urine Culture     Status: Abnormal   Collection Time: 03/26/23  5:44 PM   Specimen: Urine, Clean Catch  Result Value Ref Range Status   Specimen Description   Final    URINE, CLEAN CATCH Performed at [facility], [address], [place], [place] [address]    Special Requests   Final    NONE Performed at [facility], [address], [place], [place] [address]    Culture MULTIPLE SPECIES PRESENT, SUGGEST RECOLLECTION (A)  Final   Report Status 03/28/2023 FINAL  Final  Gastrointestinal Panel by PCR , Stool     Status: Abnormal   Collection Time: 03/26/23  7:57 PM   Specimen: Stool  Result Value Ref Range Status   Campylobacter species NOT DETECTED NOT DETECTED Final   Plesimonas shigelloides NOT DETECTED NOT DETECTED Final   Salmonella species NOT DETECTED NOT  DETECTED Final   Yersinia enterocolitica NOT DETECTED NOT DETECTED Final   Vibrio species NOT DETECTED NOT DETECTED Final   Vibrio cholerae NOT DETECTED NOT DETECTED Final   Enteroaggregative E coli (EAEC) NOT DETECTED NOT DETECTED Final   Enteropathogenic E coli (EPEC) NOT DETECTED NOT DETECTED Final   Enterotoxigenic E coli (ETEC) NOT DETECTED NOT DETECTED Final   Shiga like toxin producing E coli (STEC) NOT DETECTED NOT DETECTED Final   Shigella/Enteroinvasive E coli (EIEC) NOT DETECTED NOT DETECTED Final   Cryptosporidium NOT DETECTED NOT DETECTED Final   Cyclospora cayetanensis NOT DETECTED NOT DETECTED Final   Entamoeba histolytica NOT DETECTED NOT DETECTED Final   Giardia lamblia NOT DETECTED NOT DETECTED Final   Adenovirus F40/41 NOT DETECTED NOT DETECTED Final   Astrovirus NOT DETECTED NOT DETECTED Final   Norovirus GI/GII DETECTED (A) NOT DETECTED Final    Comment: RESULT CALLED TO, READ BACK BY AND VERIFIED WITH: KRISHNA PATEL 03/27/23 0932 MW    Rotavirus A NOT DETECTED NOT DETECTED Final   Sapovirus (I, II, IV, and V) NOT DETECTED NOT  DETECTED Final    Comment: Performed at [facility], [address] [place] [place], [place] [address]     SIGNED:   Marinda Elk, MD  [facility] 03/29/2023, 11:21 AM Pager   If 7PM-7AM, please contact night-coverage [email or web address] Password [email or web address]

## 2023-04-06 ENCOUNTER — Encounter: Payer: Self-pay | Admitting: Family Medicine

## 2023-04-06 ENCOUNTER — Non-Acute Institutional Stay: Payer: Medicare Other | Admitting: Family Medicine

## 2023-04-06 VITALS — BP 162/82 | HR 75 | Temp 97.7°F | Resp 18 | Wt 137.6 lb

## 2023-04-06 DIAGNOSIS — E43 Unspecified severe protein-calorie malnutrition: Secondary | ICD-10-CM

## 2023-04-06 DIAGNOSIS — Z515 Encounter for palliative care: Secondary | ICD-10-CM

## 2023-04-06 DIAGNOSIS — R601 Generalized edema: Secondary | ICD-10-CM | POA: Insufficient documentation

## 2023-04-06 DIAGNOSIS — R600 Localized edema: Secondary | ICD-10-CM | POA: Insufficient documentation

## 2023-04-06 DIAGNOSIS — L89153 Pressure ulcer of sacral region, stage 3: Secondary | ICD-10-CM | POA: Insufficient documentation

## 2023-04-06 DIAGNOSIS — N184 Chronic kidney disease, stage 4 (severe): Secondary | ICD-10-CM

## 2023-04-06 NOTE — Progress Notes (Signed)
Therapist, nutritional Palliative Care Consult Note Telephone: [phone]  Fax: [phone]   Date of encounter: 03/23/23 4:16 PM PATIENT NAME: Manuel Kramer [address] [place] [address]   [phone] (home)  DOB: 1955/12/05 MRN: [id number] PRIMARY CARE PROVIDER:    Diamantina Providence, FNP,  [address] [place] [address] [phone]  REFERRING PROVIDER:   Diamantina Providence, FNP [address] [place],  [place] [address] [phone]  Health Care Agent-Both children, Manuel Kramer is primary Contact Information     Name Relation Home Work Mobile   Boyers,Chris Son   [phone]   Khaalid, Lefkowitz Daughter   [phone]        I met face to face with patient in [facility]. Palliative Care was asked to follow this patient by consultation request of Diamantina Providence, FNP to address advance care planning and complex medical decision making. This is a follow up visit.  ADVANCE CARE PLANNING/GOALS OF CARE: Health care surrogate gave their permission to discuss.   PRESENT FOR DISCUSSION: Manuel Kramer, daughter Manuel Kramer and son Manuel Kramer (by phone) and Manuel Man FNP-C  GOALS: Prolong life as much as possible but remain comfortable.  BARRIERS: Currently wants full code at least until son comes from [place] and pt's sister from out of the country. Sister wants a "natural death" with full interventions   Our advance care planning conversation included a discussion about:    The value and importance of advance care planning-Son and daughter are both [facility] POA Exploration of personal, cultural or spiritual beliefs that might influence medical decisions-Belief that Hospice will stop all medications and just make pt comfortable.  Educated son that the Hospice team would talk with them about his medications and any that didn't have any immediate benefit or comfort  value they may talk about whatever is on formulary as a substitution. Advised that pt could remain a full code but the additional services would help support the patient and family. Educated son that pt may have a DVT in RLE, that albumin was poor and pt was third spacing fluid making it difficult to heal.  Advised of sacral wound which put him at risk for infection given proximity to urine and stool. Educated that if pt has DVT that recommendations would be 3-6  months of anticoagulation.  Advised with current renal function he would be a candidate for Coumadin use which requires frequent labs and adjustments.  Advised risk of not treating if DVT present is possible VTE to lungs/brain and death with treatment inability to get therapeutic with increased risk for VTE and/or bleeding.  Risk with current anemia due to CKD stage IV of life threatening bleeding. Reviewed an advance directive document-MOST with son. Daughter disconnected from the call but was at work so I am unsure when she left. Spoke with facility attending Manuel Kramer who was in agreement with referral to Hospice.  CODE STATUS: Full Code  I spent 47 minutes providing this consultation with more than 50% of the time spent on counseling patient and coordinating communication with referring provider, staff/family, chart review and documentation. --------------------------------------------------------------------------------------------------------------------------------------------------------------------------------------------  ASSESSMENT AND / RECOMMENDATIONS:  PPS: 30%  Anasarca/Protein calorie malnutrition Recent transaminitis had improved with AST 40/ ALT 70. Recommend d/c of Atorvastatin given renal function and transaminitis. Continue house supplement with nephro carb.   Sacral decubitus stage 3 Continue wound care regimen. Has pressure reduction surface. Optimize blood  glucose control with goal of 140-180 with no  hypoglycemia. Continue to reposition and offload/leave brief unfastened.  3.    CKD stage 4 Cr 3.18, eGFR 21 Encourage fluid intake. Avoid nephrotoxic medications-given recent transaminitis and poor renal function, recommend d/c of Atorvastatin.  4.  Edema, localized Palpable cord in right calf, increased pain in RLE particularly with movement. Unequal edema. Discussed risks/benefits of possible DVT and treatment with family versus no treatment if clot present. Recommend venous doppler as family wants to treat and understands risk of life threatening bleed or potential complications due to renal function in obtaining therapeutic levels of Coumadin. If Venous doppler + for DVT, would recommend Coumadin given current CKD stage IV and d/c of Plavix.  5.  Palliative Care Encounter Family in agreement with Hospice and want pt to remain full code presently. Contact daughter Manuel Kramer to set up admission visit. Verbal order obtained from attending PCP-Manuel Kramer for Hospice. Given pt's confusion, possible DVT with pain in RLE and pain with sacral wound, recommend giving Norco 5/325 mg po Q 8 hrs scheduled for 14 days then re-evaluate.   Follow up Palliative Care Visit:  Palliative Care continuing to follow up by monitoring for changes in appetite, weight, functional and cognitive status for chronic disease progression and management in agreement with patient's stated goals of care.   This visit was coded based on medical decision making (MDM).  Chief Complaint  Palliative Care continues to follow for chronic medical management in setting of hyperglycemia with severe protein calorie malnutrition.    HISTORY OF PRESENT ILLNESS:  Manuel Kramer is a 67 y.o. year old male with chronic diastolic CHF and hx of CVA with residual deficits.  Pt denies CP, SOB. Pt refuses to leave his O2 on and per facility staff he has a new wound on his buttocks. He is seen lying in bed.  Pt with recent ED visit  for symptomatic anemia. His last HGB A1c was 7.9%. He was observed chewing on something for an extended time period and when facility staff checked his mouth he was chewing on the hard plastic packet of a butter packet.  He was holding behind his right knee flexed to his abdomen and starts grimacing, attempting to withdraw and yelling out with any attempt to move his leg.  He states "No more pain."   ACTIVITIES OF DAILY LIVING: CONTINENT OF BLADDER/BOWEL? No FEEDING-intermittently requires assistance and eats better when fed BATHING/DRESSING-requires assistance   APPETITE? Good WEIGHT:  Most recent noted weight on 03/29/23 137 lbs 9.1 oz (62.4), 11/28/22 was 67.6 kg  CURRENT PROBLEM LIST:  Patient Active Problem List   Diagnosis Date Noted   Dysphagia 11/29/2022   Palliative care encounter 11/29/2022   CKD (chronic kidney disease) stage 4, GFR 15-29 ml/min (HCC) 11/29/2022   Hypoglycemia 11/08/2022   Essential hypertension 11/08/2022   History of CVA (cerebrovascular accident) 11/08/2022   Seizure disorder (HCC) 05/09/2022   Pressure injury of skin 05/08/2022   Bilateral lower extremity edema 01/25/2022   Type 2 diabetes mellitus with complication, with long-term current use of insulin (HCC) 01/25/2022   Depression with anxiety 01/25/2022   Physical deconditioning 01/25/2022   Left hip pain 01/25/2022   Anemia of chronic disease 01/25/2022   Chronic diastolic CHF (congestive heart failure) (HCC) 01/25/2022   Cerebral embolism with cerebral infarction 11/17/2021   Protein-calorie malnutrition, severe 11/16/2021   Hyperglycemia    Hyperlipidemia 05/22/2016   Diabetic neuropathy (HCC) 05/22/2016   Other specified diabetes  mellitus without complications (HCC) 10/02/2014   Smoking 10/02/2014   Family history of prostate cancer 10/02/2014   PAST MEDICAL HISTORY:  Active Ambulatory Problems    Diagnosis Date Noted   Other specified diabetes mellitus without complications (HCC)  10/02/2014   Smoking 10/02/2014   Family history of prostate cancer 10/02/2014   Hyperlipidemia 05/22/2016   Diabetic neuropathy (HCC) 05/22/2016   Hyperglycemia    Protein-calorie malnutrition, severe 11/16/2021   Cerebral embolism with cerebral infarction 11/17/2021   Bilateral lower extremity edema 01/25/2022   Type 2 diabetes mellitus with complication, with long-term current use of insulin (HCC) 01/25/2022   Depression with anxiety 01/25/2022   Physical deconditioning 01/25/2022   Left hip pain 01/25/2022   Anemia of chronic disease 01/25/2022   Chronic diastolic CHF (congestive heart failure) (HCC) 01/25/2022   Pressure injury of skin 05/08/2022   Seizure disorder (HCC) 05/09/2022   Hypoglycemia 11/08/2022   Essential hypertension 11/08/2022   History of CVA (cerebrovascular accident) 11/08/2022   Dysphagia 11/29/2022   Palliative care encounter 11/29/2022   CKD (chronic kidney disease) stage 4, GFR 15-29 ml/min (HCC) 11/29/2022   Resolved Ambulatory Problems    Diagnosis Date Noted   Hypertensive emergency 10/02/2014   Renal insufficiency 10/02/2014   Acute encephalopathy 11/14/2021   AKI (acute kidney injury) (HCC)    Hypertensive emergency without congestive heart failure    Sepsis (HCC)    Acute renal failure superimposed on stage 3b chronic kidney disease (HCC) 01/25/2022   C. difficile colitis 01/26/2022   Hypokalemia 01/26/2022   Acute metabolic encephalopathy 05/08/2022   Acute respiratory failure with hypoxemia (HCC) 05/08/2022   Hyperglycemic hyperosmolar nonketotic coma (HCC) 05/08/2022   Aspiration pneumonia (HCC) 05/08/2022   Acute ischemic stroke (HCC) > small acute to subacute pontine infarct seen on MRI 05/10/2022   Sepsis due to undetermined organism (HCC) 11/08/2022   Acute on chronic diastolic congestive heart failure (HCC) 11/08/2022   UTI (urinary tract infection) 11/16/2022   Past Medical History:  Diagnosis Date   Chronic diarrhea     Diabetes mellitus without complication (HCC)    Hypertension    SOCIAL HX:  Social History   Tobacco Use   Smoking status: Some Days    Packs/day: 0.50    Years: 40.00    Additional pack years: 0.00    Total pack years: 20.00    Types: Cigarettes   Smokeless tobacco: Former  Substance Use Topics   Alcohol use: Not Currently    Alcohol/week: 1.0 standard drink of alcohol    Types: 1 Cans of beer per week    Comment: Patient has not drunk in 3 weeks   FAMILY HX:  Family History  Problem Relation Age of Onset   Cancer Mother    Diabetes Mother    Hypertension Mother    Cancer Father        Preferred Pharmacy: ALLERGIES:  Allergies  Allergen Reactions   Codeine Other (See Comments)    On MAR, unknown reaction   Iodinated Contrast Media Other (See Comments)    On MAR, unknown reaction   Pentazocine Other (See Comments)    On MAR, unknown reaction **Talwin**   Statins Other (See Comments)    On MAR, unknown reaction     PERTINENT MEDICATIONS:  Outpatient Encounter Medications as of 03/23/2023  Medication Sig   acetaminophen (TYLENOL) 500 MG tablet Take 1 tablet (500 mg total) by mouth every 6 (six) hours as needed. (Patient taking differently: Take 500 mg  by mouth every 6 (six) hours as needed for moderate pain.)   acidophilus (RISAQUAD) CAPS capsule Take 2 capsules by mouth daily. (Patient taking differently: Take 1 capsule by mouth in the morning and at bedtime.)   amLODipine (NORVASC) 10 MG tablet Take 1 tablet (10 mg total) by mouth daily.   aspirin EC 81 MG tablet Take 81 mg by mouth daily. Swallow whole.   atorvastatin (LIPITOR) 40 MG tablet Take 1 tablet (40 mg total) by mouth daily.   carvedilol (COREG) 25 MG tablet Take 1 tablet (25 mg total) by mouth 2 (two) times daily with a meal.   cholecalciferol (VITAMIN D3) 25 MCG (1000 UNIT) tablet Take 1,000 Units by mouth daily.   Cinnamon 500 MG capsule Take 500 mg by mouth daily.   cloNIDine (CATAPRES) 0.3 MG  tablet Take 1 tablet (0.3 mg total) by mouth 3 (three) times daily.   clopidogrel (PLAVIX) 75 MG tablet Take 75 mg by mouth daily.   doxazosin (CARDURA) 2 MG tablet Take 1 tablet (2 mg total) by mouth daily.   ferrous sulfate 325 (65 FE) MG tablet Take 1 tablet (325 mg total) by mouth daily with breakfast.   hydrALAZINE (APRESOLINE) 100 MG tablet Take 1 tablet (100 mg total) by mouth 3 (three) times daily.   Infant Care Products [facility]) OINT Apply 1 application  topically in the morning and at bedtime. Apply topically to buttocks   insulin aspart (NOVOLOG) 100 UNIT/ML injection Inject 3 Units into the skin 3 (three) times daily with meals. If eats more than 50% of meals (Patient taking differently: Inject 2-11 Units into the skin 4 (four) times daily -  before meals and at bedtime.  S/S 101-150= 2U, 151-200= 3U, 201-250= 5U, 251-300= 7U, 301-350= 9U, >350= 11U)   ipratropium (ATROVENT) 0.06 % nasal spray Place 2 sprays into both nostrils 2 (two) times daily.   isosorbide dinitrate (ISORDIL) 40 MG tablet Take 40 mg by mouth 3 (three) times daily.   levETIRAcetam (KEPPRA) 500 MG tablet Take 1 tablet (500 mg total) by mouth 2 (two) times daily.   loratadine (CLARITIN) 10 MG tablet Take 10 mg by mouth every other day.   magnesium oxide (MAG-OX) 400 MG tablet Take 1 tablet (400 mg total) by mouth daily.   mirtazapine (REMERON) 7.5 MG tablet Take 7.5 mg by mouth at bedtime.   modafinil (PROVIGIL) 100 MG tablet Take 1 tablet (100 mg total) by mouth daily.   Multiple Vitamin (MULTIVITAMIN WITH MINERALS) TABS tablet Take 1 tablet by mouth daily.   niacin 100 MG tablet Take 100 mg by mouth 2 (two) times daily.   Nutritional Supplements (FEEDING SUPPLEMENT, NEPRO CARB STEADY,) LIQD Take 237 mLs by mouth 2 (two) times daily between meals. (Patient not taking: Reported on 02/23/2023)   sertraline (ZOLOFT) 50 MG tablet Take 50 mg by mouth daily.   sodium bicarbonate 650 MG tablet Take 1,300 mg by mouth 2  (two) times daily.   spironolactone (ALDACTONE) 25 MG tablet Take 1 tablet (25 mg total) by mouth daily.   Torsemide 40 MG TABS Take 40 mg by mouth daily.   No facility-administered encounter medications on file as of 03/23/2023.    History obtained from review of EMR, discussion with provider, family, facility staff/caregiver and/or patient.   CBC    Component Value Date/Time   WBC 10.0 03/29/2023 0524   RBC 2.77 (L) 03/29/2023 0524   HGB 8.5 (L) 03/29/2023 0524   HCT 26.7 (L) 03/29/2023 1610  PLT 223 03/29/2023 0524   MCV 96.4 03/29/2023 0524   MCH 30.7 03/29/2023 0524   MCHC 31.8 03/29/2023 0524   RDW 14.6 03/29/2023 0524   LYMPHSABS 0.8 03/29/2023 0524   MONOABS 0.8 03/29/2023 0524   EOSABS 0.2 03/29/2023 0524   BASOSABS 0.0 03/29/2023 0524      Latest Ref Rng & Units 03/29/2023    5:24 AM 03/28/2023    5:24 AM 03/27/2023    5:14 AM  CMP  Glucose 70 - 99 mg/dL 42  454  93   BUN 8 - 23 mg/dL 98  098  119   Creatinine 0.61 - 1.24 mg/dL 1.47  8.29  5.62   Sodium 135 - 145 mmol/L 130  130  134   Potassium 3.5 - 5.1 mmol/L 4.2  4.5  4.4   Chloride 98 - 111 mmol/L 99  98  103   CO2 22 - 32 mmol/L 20  20  21    Calcium 8.9 - 10.3 mg/dL 8.3  8.4  8.3   Total Protein 6.5 - 8.1 g/dL   5.8   Total Bilirubin 0.3 - 1.2 mg/dL   0.9   Alkaline Phos 38 - 126 U/L   109   AST 15 - 41 U/L   40   ALT 0 - 44 U/L   79        Latest Ref Rng & Units 03/27/2023    5:14 AM 03/26/2023    4:15 PM 02/22/2023    9:58 PM  Hepatic Function  Total Protein 6.5 - 8.1 g/dL 5.8  6.1  5.6   Albumin 3.5 - 5.0 g/dL 2.3  2.5  2.5   AST 15 - 41 U/L 40  70  146   ALT 0 - 44 U/L 79  98  174   Alk Phosphatase 38 - 126 U/L 109  115  112   Total Bilirubin 0.3 - 1.2 mg/dL 0.9  0.4  0.3             I reviewed available labs, medications, imaging, studies and related documents from the EMR.  Records/imaging reviewed and summarized above since last visit.   Physical Exam: GENERAL: NAD, mildly disheveled,  looks comfortable until attempting to move pt LUNGS: Diminished, no increased work of breathing, room air CARDIAC:  S1S2, RRR with LUSB murmur, 3+ pedal edema of right foot with palpable cord in calf, trace edema in left foot, Significant scrotal edema and abdominal edema ABD:  hypo-active BS x 4 quads, soft, mild generalized tenderness, no rebound or guarding EXTREMITIES: Muscle atrophy noted in BLE Skin:  has large area denuded of skin over sacum with small area of intact hyperpigmented skin about 2-3 cm in diameter at 2-3 o clock.  Just superior to rectum is an open area  With portions of fatty tissue visible (? Tunneling), no visible bone, some slough centrally NEURO: Noted cognitive impairment  PSYCH:  Blunted, non-anxious affect, A & O x 1  Thank you for the opportunity to participate in the care of [employer]. Please call our main office at [phone] if we can be of additional assistance.    Manuel Man FNP-C  [email or web address] .[facility]  Phone:  [phone]

## 2023-07-01 DEATH — deceased
